# Patient Record
Sex: Male | Born: 1960
Health system: Southern US, Community
[De-identification: ages and names within clinical notes are randomized; demographics above are authoritative.]

## PROBLEM LIST (undated history)

## (undated) DIAGNOSIS — K219 Gastro-esophageal reflux disease without esophagitis: Secondary | ICD-10-CM

## (undated) DIAGNOSIS — F329 Major depressive disorder, single episode, unspecified: Secondary | ICD-10-CM

## (undated) DIAGNOSIS — K5900 Constipation, unspecified: Secondary | ICD-10-CM

## (undated) DIAGNOSIS — I10 Essential (primary) hypertension: Secondary | ICD-10-CM

## (undated) DIAGNOSIS — K7460 Unspecified cirrhosis of liver: Secondary | ICD-10-CM

## (undated) DIAGNOSIS — J449 Chronic obstructive pulmonary disease, unspecified: Secondary | ICD-10-CM

## (undated) DIAGNOSIS — R0600 Dyspnea, unspecified: Secondary | ICD-10-CM

## (undated) DIAGNOSIS — F1010 Alcohol abuse, uncomplicated: Secondary | ICD-10-CM

## (undated) DIAGNOSIS — F32A Depression, unspecified: Secondary | ICD-10-CM

## (undated) HISTORY — PX: NO PAST SURGERIES: SHX2092

## (undated) HISTORY — PX: MANDIBLE FRACTURE SURGERY: SHX706

---

## 2012-05-08 ENCOUNTER — Emergency Department (HOSPITAL_COMMUNITY)
Admission: EM | Admit: 2012-05-08 | Discharge: 2012-05-08 | Discharge status: Against Medical Advice | Payer: Self-pay | Attending: Emergency Medicine | Admitting: Emergency Medicine

## 2012-05-08 ENCOUNTER — Encounter (HOSPITAL_COMMUNITY): Payer: Self-pay | Admitting: Emergency Medicine

## 2012-05-08 DIAGNOSIS — J3489 Other specified disorders of nose and nasal sinuses: Secondary | ICD-10-CM | POA: Insufficient documentation

## 2012-05-08 DIAGNOSIS — R059 Cough, unspecified: Secondary | ICD-10-CM | POA: Insufficient documentation

## 2012-05-08 DIAGNOSIS — M549 Dorsalgia, unspecified: Secondary | ICD-10-CM | POA: Insufficient documentation

## 2012-05-08 DIAGNOSIS — R05 Cough: Secondary | ICD-10-CM | POA: Insufficient documentation

## 2012-05-08 NOTE — ED Notes (Signed)
Bed:WA05<BR> Expected date:<BR> Expected time:<BR> Means of arrival:<BR> Comments:<BR> ems

## 2012-05-08 NOTE — ED Notes (Signed)
Per EMS-Pt c/o of chronic lower back pain 10/10. Also c/o of nasal congestion and nonproductive cough causing chest wall pain 10/10. Denies history of COPD/Asthma. NAD at this time.

## 2012-05-08 NOTE — ED Notes (Signed)
Pt came in EMS, left AMA, before being seen by a doctor.  Was here x55mins.  Charge Diane aware.

## 2012-10-02 ENCOUNTER — Encounter (HOSPITAL_COMMUNITY): Payer: Self-pay | Admitting: *Deleted

## 2012-10-02 ENCOUNTER — Emergency Department (HOSPITAL_COMMUNITY): Payer: Self-pay

## 2012-10-02 ENCOUNTER — Emergency Department (HOSPITAL_COMMUNITY)
Admission: EM | Admit: 2012-10-02 | Discharge: 2012-10-03 | Disposition: A | Discharge status: Home / Self Care | Payer: Self-pay | Attending: Emergency Medicine | Admitting: Emergency Medicine

## 2012-10-02 DIAGNOSIS — E872 Acidosis, unspecified: Secondary | ICD-10-CM | POA: Insufficient documentation

## 2012-10-02 DIAGNOSIS — F10929 Alcohol use, unspecified with intoxication, unspecified: Secondary | ICD-10-CM

## 2012-10-02 DIAGNOSIS — I10 Essential (primary) hypertension: Secondary | ICD-10-CM | POA: Insufficient documentation

## 2012-10-02 DIAGNOSIS — G8929 Other chronic pain: Secondary | ICD-10-CM | POA: Insufficient documentation

## 2012-10-02 DIAGNOSIS — F1010 Alcohol abuse, uncomplicated: Secondary | ICD-10-CM | POA: Insufficient documentation

## 2012-10-02 DIAGNOSIS — F17200 Nicotine dependence, unspecified, uncomplicated: Secondary | ICD-10-CM | POA: Insufficient documentation

## 2012-10-02 DIAGNOSIS — M549 Dorsalgia, unspecified: Secondary | ICD-10-CM | POA: Insufficient documentation

## 2012-10-02 DIAGNOSIS — R059 Cough, unspecified: Secondary | ICD-10-CM | POA: Insufficient documentation

## 2012-10-02 DIAGNOSIS — R05 Cough: Secondary | ICD-10-CM | POA: Insufficient documentation

## 2012-10-02 LAB — CBC WITH DIFFERENTIAL/PLATELET
Basophils Relative: 1 % (ref 0–1)
Eosinophils Absolute: 0.5 10*3/uL (ref 0.0–0.7)
HCT: 44.2 % (ref 39.0–52.0)
Hemoglobin: 16 g/dL (ref 13.0–17.0)
MCH: 31.9 pg (ref 26.0–34.0)
MCHC: 36.2 g/dL — ABNORMAL HIGH (ref 30.0–36.0)
Monocytes Absolute: 0.5 10*3/uL (ref 0.1–1.0)
Monocytes Relative: 5 % (ref 3–12)

## 2012-10-02 LAB — COMPREHENSIVE METABOLIC PANEL
Albumin: 3.8 g/dL (ref 3.5–5.2)
BUN: 6 mg/dL (ref 6–23)
Chloride: 95 mEq/L — ABNORMAL LOW (ref 96–112)
Creatinine, Ser: 0.61 mg/dL (ref 0.50–1.35)
GFR calc Af Amer: >90 mL/min (ref >90)
Total Bilirubin: 0.4 mg/dL (ref 0.3–1.2)

## 2012-10-02 LAB — RAPID URINE DRUG SCREEN, HOSP PERFORMED
Cocaine: NOT DETECTED
Opiates: NOT DETECTED
Tetrahydrocannabinol: NOT DETECTED

## 2012-10-02 MED ORDER — SODIUM CHLORIDE 0.9 % IV BOLUS (SEPSIS)
1000.0000 mL | Freq: Once | INTRAVENOUS | Status: AC
  Administered 2012-10-02: 1000 mL via INTRAVENOUS

## 2012-10-02 MED ORDER — LORAZEPAM 1 MG PO TABS: 0.0000 mg | ORAL_TABLET | Freq: Two times a day (BID) | ORAL | Status: DC

## 2012-10-02 MED ORDER — VITAMIN B-1 100 MG PO TABS
100.0000 mg | ORAL_TABLET | Freq: Every day | ORAL | Status: DC
  Administered 2012-10-02: 100 mg via ORAL
  Filled 2012-10-02: qty 1

## 2012-10-02 MED ORDER — THIAMINE HCL 100 MG/ML IJ SOLN: 100.0000 mg | Freq: Every day | INTRAMUSCULAR | Status: DC

## 2012-10-02 MED ORDER — FOLIC ACID 1 MG PO TABS
1.0000 mg | ORAL_TABLET | Freq: Every day | ORAL | Status: DC
  Administered 2012-10-02: 1 mg via ORAL
  Filled 2012-10-02: qty 1

## 2012-10-02 MED ORDER — LORAZEPAM 1 MG PO TABS
1.0000 mg | ORAL_TABLET | Freq: Four times a day (QID) | ORAL | Status: DC | PRN
  Filled 2012-10-02: qty 2

## 2012-10-02 MED ORDER — LORAZEPAM 2 MG/ML IJ SOLN: 1.0000 mg | Freq: Four times a day (QID) | INTRAMUSCULAR | Status: DC | PRN

## 2012-10-02 MED ORDER — ADULT MULTIVITAMIN W/MINERALS CH
1.0000 | ORAL_TABLET | Freq: Every day | ORAL | Status: DC
  Administered 2012-10-02: 1 via ORAL
  Filled 2012-10-02: qty 1

## 2012-10-02 MED ORDER — LORAZEPAM 1 MG PO TABS
0.0000 mg | ORAL_TABLET | Freq: Four times a day (QID) | ORAL | Status: DC
  Administered 2012-10-02: 2 mg via ORAL

## 2012-10-02 NOTE — ED Notes (Signed)
Per ems called out per pt's brother d/t pt binge drinking for 6 days w/o any food. Upon ems arrival pt able to ambulate to truck. Pt states he has been drinking today. Pt also c/o chronic back pain.

## 2012-10-02 NOTE — ED Provider Notes (Signed)
History     CSN: [id number]  Arrival date & time 10/02/12  Ernestina Columbia   First MD Initiated Contact with Patient 10/02/12 2004      Chief Complaint  Patient presents with  . Alcohol Intoxication    (Consider location/radiation/quality/duration/timing/severity/associated sxs/prior treatment) HPI Comments: Patient is a 52 year old male current everyday smoker with a history of binge drinking that presents to the emergency department because his brother called EMS.  Family member reports that he has been binge drinking for the last 6 days with no food intake.  Per EMS when they arrived patient was able to ambulate to truck without difficulty.  Patient does not deny drinking today stating that he drank multiple beers.  Patient reports profusely that he does not want to be here and is ready to go home.  Patient denies any suicidal homicidal ideations.  Patient reports that he does not want help with detox.  Patient denies any visual or auditory hallucinations, a history of DT, fevers, night sweats or chills.  Patient reports chronic back pain and cough.  The history is provided by the patient and a significant other.    History reviewed. No pertinent past medical history.  History reviewed. No pertinent past surgical history.  History reviewed. No pertinent family history.  History  Substance Use Topics  . Smoking status: Current Every Day Smoker  . Smokeless tobacco: Not on file  . Alcohol Use: Yes     Comment: 6 days bringe drinking      Review of Systems  All other systems reviewed and are negative.    Allergies  Review of patient's allergies indicates no known allergies.  Home Medications  No current outpatient prescriptions on file.  BP 148/101  Pulse 101  Temp(Src) 98.5 F (36.9 C) (Oral)  Resp 16  SpO2 98%  Physical Exam  Nursing note and vitals reviewed. Constitutional: He is oriented to person, place, and time. He appears well-developed and well-nourished. No  distress.  Hypertensive   HENT:  Head: Normocephalic and atraumatic.  Mouth/Throat: Oropharynx is clear and moist. No oropharyngeal exudate.  Eyes: Conjunctivae and EOM are normal. Pupils are equal, round, and reactive to light. No scleral icterus.  Neck: Normal range of motion. Neck supple. No tracheal deviation present. No thyromegaly present.  Cardiovascular: Normal rate, regular rhythm, normal heart sounds and intact distal pulses.   Pulmonary/Chest: Effort normal. No stridor.  Rhonchi in lungs bilaterally. Coughing on exam   Abdominal: Soft.  Musculoskeletal: Normal range of motion. He exhibits no edema and no tenderness.  Neurological: He is alert and oriented to person, place, and time. Coordination normal.  Skin: Skin is warm and dry. No rash noted. He is not diaphoretic. No erythema. No pallor.  Psychiatric: He has a normal mood and affect. His behavior is normal.    ED Course  Procedures (including critical care time)  Labs Reviewed  CBC WITH DIFFERENTIAL - Abnormal; Notable for the following:    MCHC 36.2 (*)    Neutrophils Relative 42 (*)    Lymphocytes Relative 48 (*)    Lymphs Abs 5.0 (*)    All other components within normal limits  COMPREHENSIVE METABOLIC PANEL - Abnormal; Notable for the following:    Sodium 133 (*)    Chloride 95 (*)    Glucose, Bld 111 (*)    AST 39 (*)    All other components within normal limits  ETHANOL - Abnormal; Notable for the following:    Alcohol, Ethyl (B) 320 (*)  All other components within normal limits  URINE RAPID DRUG SCREEN (HOSP PERFORMED)   No results found.   No diagnosis found.  BP 118/44  Pulse 77  Temp(Src) 98.2 F (36.8 C) (Oral)  Resp 18  SpO2 92%   MDM  Hypertension  Mild alcoholic ketoacidosis  Patient brought to emergency department after brother called the ambulance for drinking for 6 days.  Patient does not want detox.  Outpatient resources given.  Patient hasn't eaten well in the emergency  department and IV fluids given.  Labs and imaging reviewed showing the patient is intoxicated and has an anion gap of 15, but no ketonuria.  Patient able to ambulate in the emergency department prior to discharge. Advised to follow up and re-check blood pressure        Jaci Carrel, PA-C 10/03/12 0025

## 2012-10-02 NOTE — ED Notes (Signed)
Bed:WA03<BR> Expected date:<BR> Expected time:<BR> Means of arrival:<BR> Comments:<BR>

## 2012-10-03 LAB — URINALYSIS, ROUTINE W REFLEX MICROSCOPIC
Glucose, UA: NEGATIVE mg/dL
Hgb urine dipstick: NEGATIVE
Protein, ur: NEGATIVE mg/dL
Specific Gravity, Urine: 1.007 (ref 1.005–1.030)
pH: 5 (ref 5.0–8.0)

## 2012-10-03 NOTE — ED Notes (Signed)
Pt up for d/c but d/t pt being with etoh on board unable to leave w/o having a ride home. Pt states he does not have anyone to pick him up. Informed pt he will have to stay until his etoh level declines. Press photographer and PA informed and both agree. Will continue to monitor.

## 2012-10-03 NOTE — ED Provider Notes (Signed)
Medical screening examination/treatment/procedure(s) were performed by non-physician practitioner and as supervising physician I was immediately available for consultation/collaboration.   Gwyneth Sprout, MD 10/03/12 2028

## 2012-11-28 ENCOUNTER — Encounter (HOSPITAL_COMMUNITY): Payer: Self-pay | Admitting: *Deleted

## 2012-11-28 ENCOUNTER — Emergency Department (HOSPITAL_COMMUNITY)
Admission: EM | Admit: 2012-11-28 | Discharge: 2012-11-29 | Disposition: A | Discharge status: Home / Self Care | Payer: Self-pay | Attending: Emergency Medicine | Admitting: Emergency Medicine

## 2012-11-28 DIAGNOSIS — F10220 Alcohol dependence with intoxication, uncomplicated: Secondary | ICD-10-CM

## 2012-11-28 DIAGNOSIS — F17200 Nicotine dependence, unspecified, uncomplicated: Secondary | ICD-10-CM | POA: Insufficient documentation

## 2012-11-28 DIAGNOSIS — F1010 Alcohol abuse, uncomplicated: Secondary | ICD-10-CM | POA: Insufficient documentation

## 2012-11-28 MED ORDER — SODIUM CHLORIDE 0.9 % IV BOLUS (SEPSIS)
1000.0000 mL | Freq: Once | INTRAVENOUS | Status: AC
  Administered 2012-11-29: 1000 mL via INTRAVENOUS

## 2012-11-28 NOTE — ED Notes (Signed)
Pt to ER via EMS for ETOH intoxification and questionable drug use  (pt was found with Zyrtec and crack pipe); pt was at the hotel at which he lives and was laying on sidewalk; bystander called 911 pt reports that he has been binge drinking for 2 days; pt reports that he has not had any intake other than ETOH x 2 days; pt unable to ambulate due to intoxification.

## 2012-11-29 LAB — POCT I-STAT, CHEM 8
BUN: 8 mg/dL (ref 6–23)
Chloride: 107 mEq/L (ref 96–112)
HCT: 51 % (ref 39.0–52.0)
Potassium: 5.3 mEq/L — ABNORMAL HIGH (ref 3.5–5.1)
Sodium: 142 mEq/L (ref 135–145)

## 2012-11-29 LAB — ETHANOL: Alcohol, Ethyl (B): 449 mg/dL (ref 0–11)

## 2012-11-29 MED ORDER — SODIUM CHLORIDE 0.9 % IV BOLUS (SEPSIS)
1000.0000 mL | Freq: Once | INTRAVENOUS | Status: AC
  Administered 2012-11-29: 1000 mL via INTRAVENOUS

## 2012-11-29 NOTE — ED Provider Notes (Signed)
8:35 AM patient alert [place] to come score 15 does not appear intoxicated. Pleasant cooperative patient states he does not have an alcohol problem. Does admit to binge drinking.  I suggested the patient that he may have a drinking problem to stand up in the emergency department with very high blood alcohol.  Results for orders placed during the hospital encounter of 11/28/12  ETHANOL      Result Value Range   Alcohol, Ethyl (B) 449 (*) 0 - 11 mg/dL  POCT I-STAT, CHEM 8      Result Value Range   Sodium 142  135 - 145 mEq/L   Potassium 5.3 (*) 3.5 - 5.1 mEq/L   Chloride 107  96 - 112 mEq/L   BUN 8  6 - 23 mg/dL   Creatinine, Ser 8.11 (*) 0.50 - 1.35 mg/dL   Glucose, Bld 93  70 - 99 mg/dL   Calcium, Ion 9.14 (*) 1.12 - 1.23 mmol/L   TCO2 20  0 - 100 mmol/L   Hemoglobin 17.3 (*) 13.0 - 17.0 g/dL   HCT 78.2  95.6 - 21.3 %   No results found.  Diagnosis#1 alcohol intoxication  #2 mild renal insufficiency  Doug Sou, MD 11/29/12 [phone]

## 2012-11-29 NOTE — ED Provider Notes (Signed)
   History    CSN: [id number] Arrival date & time 11/28/12  2150  First MD Initiated Contact with Patient 11/28/12 2222     Chief Complaint  Patient presents with  . Alcohol Intoxication   (Consider location/radiation/quality/duration/timing/severity/associated sxs/prior Treatment) HPI  Patient presents to the emergency department via EMS for alcohol intoxication.  Patient was found lying on the sidewalk outside of his apartment complex.  The patient, states he had been drinking for the last 2 days.  Patient is unable to give any history as he is sleeping and will not arouse.  History reviewed. No pertinent past medical history. History reviewed. No pertinent past surgical history. No family history on file. History  Substance Use Topics  . Smoking status: Current Every Day Smoker    Types: Cigarettes  . Smokeless tobacco: Not on file  . Alcohol Use: Yes     Comment: 6 days bringe drinking    Review of Systems Level V caveat applies due to alcohol intoxication Allergies  Review of patient's allergies indicates no known allergies.  Home Medications  No current outpatient prescriptions on file. BP 109/71  Pulse 79  Temp(Src) 98.1 F (36.7 C) (Oral)  Resp 20  SpO2 94% Physical Exam  Nursing note and vitals reviewed. Constitutional: He appears well-developed and well-nourished. No distress.  HENT:  Head: Normocephalic and atraumatic.  Cardiovascular: Normal rate, regular rhythm and normal heart sounds.   Pulmonary/Chest: Effort normal and breath sounds normal.  Skin: Skin is warm and dry.    ED Course  Procedures (including critical care time) Labs Reviewed  POCT I-STAT, CHEM 8 - Abnormal; Notable for the following:    Potassium 5.3 (*)    Creatinine, Ser 1.40 (*)    Calcium, Ion 0.92 (*)    Hemoglobin 17.3 (*)    All other components within normal limits  ETHANOL   Patient is stable.  Patient receives IV fluids.  Will need to wait for the patient's  overall   MDM    Carlyle Dolly, PA-C 11/29/12 [phone]

## 2012-12-04 NOTE — ED Provider Notes (Signed)
Medical screening examination/treatment/procedure(s) were performed by non-physician practitioner and as supervising physician I was immediately available for consultation/collaboration.  Jonnette Nuon, MD 12/04/12 0718 

## 2013-08-24 ENCOUNTER — Emergency Department (HOSPITAL_COMMUNITY)
Admission: EM | Admit: 2013-08-24 | Discharge: 2013-08-25 | Disposition: A | Discharge status: Other Acute Inpatient Hospital | Payer: Federal, State, Local not specified - Other | Attending: Emergency Medicine | Admitting: Emergency Medicine

## 2013-08-24 ENCOUNTER — Encounter (HOSPITAL_COMMUNITY): Payer: Self-pay | Admitting: Emergency Medicine

## 2013-08-24 DIAGNOSIS — F1020 Alcohol dependence, uncomplicated: Secondary | ICD-10-CM

## 2013-08-24 DIAGNOSIS — G8929 Other chronic pain: Secondary | ICD-10-CM

## 2013-08-24 DIAGNOSIS — F32A Depression, unspecified: Secondary | ICD-10-CM

## 2013-08-24 DIAGNOSIS — R0789 Other chest pain: Secondary | ICD-10-CM

## 2013-08-24 DIAGNOSIS — F3289 Other specified depressive episodes: Secondary | ICD-10-CM | POA: Insufficient documentation

## 2013-08-24 DIAGNOSIS — F489 Nonpsychotic mental disorder, unspecified: Secondary | ICD-10-CM | POA: Insufficient documentation

## 2013-08-24 DIAGNOSIS — F17200 Nicotine dependence, unspecified, uncomplicated: Secondary | ICD-10-CM | POA: Insufficient documentation

## 2013-08-24 DIAGNOSIS — R45851 Suicidal ideations: Secondary | ICD-10-CM | POA: Insufficient documentation

## 2013-08-24 DIAGNOSIS — F329 Major depressive disorder, single episode, unspecified: Secondary | ICD-10-CM | POA: Insufficient documentation

## 2013-08-24 DIAGNOSIS — R059 Cough, unspecified: Secondary | ICD-10-CM | POA: Insufficient documentation

## 2013-08-24 DIAGNOSIS — R05 Cough: Secondary | ICD-10-CM | POA: Insufficient documentation

## 2013-08-24 DIAGNOSIS — R071 Chest pain on breathing: Secondary | ICD-10-CM | POA: Insufficient documentation

## 2013-08-24 HISTORY — DX: Alcohol abuse, uncomplicated: F10.10

## 2013-08-24 NOTE — ED Provider Notes (Signed)
CSN: [id number]     Arrival date & time 08/24/13  2331 History   First MD Initiated Contact with Patient 08/24/13 2340     No chief complaint on file.    (Consider location/radiation/quality/duration/timing/severity/associated sxs/prior Treatment) HPI Comments: Pt is intoxicated currently, comes here due to severe episodic sharp CP's on left rib cage area that he reports he has had intermittent for many years, but never had evaluated, worse today.  Also, because of these chronic pains, poor living condition, having only a brother that he lives with but isn't close to reports he doesn't want to live.  No specific plan, he reports he has tried to drink himself to death before, but always wakes up.  He tried to hang himself a long time ago and woke up in the hospital.  He indicates that he has no guns.  He reports no drug use, but drinks daily up to 6-7 40's of beer daily.  He has been sober episodically, but cannot tell me exactly when was the last time.  He has a chronic cough, smokes, non productive, not worse, no N/V/d/.  No black or tarry stools.  No abd pain, dysuria.  Level 5 caveat due to intoxication and intermittent uncooperativeness and labile mood.  The history is provided by the patient.    Past Medical History  Diagnosis Date  . ETOH abuse    History reviewed. No pertinent past surgical history. No family history on file. History  Substance Use Topics  . Smoking status: Current Every Day Smoker    Types: Cigarettes  . Smokeless tobacco: Not on file  . Alcohol Use: Yes     Comment: 6 days bringe drinking    Review of Systems  Unable to perform ROS: Psychiatric disorder      Allergies  Review of patient's allergies indicates no known allergies.  Home Medications  No current outpatient prescriptions on file. BP 119/89  Pulse 95  Temp(Src) 97.8 F (36.6 C) (Oral)  Resp 20  SpO2 95% Physical Exam  Nursing note and vitals reviewed. Constitutional: He is oriented to  person, place, and time. He appears well-developed and well-nourished.  will have paroxysms of pain during H&P apparently coming from left rib cage that lasts for a second or two and eases off, repeatedly  HENT:  Head: Normocephalic and atraumatic.  Eyes: EOM are normal. Right conjunctiva is injected. Left conjunctiva is injected. No scleral icterus.  Cardiovascular: Normal rate and regular rhythm.   Pulmonary/Chest: Effort normal.  Abdominal: Soft. He exhibits no distension. There is no rebound and no guarding.  Neurological: He is alert and oriented to person, place, and time. He exhibits normal muscle tone. Coordination normal.  Skin: Skin is warm and dry.  Psychiatric: His mood appears not anxious. His affect is labile. He is not slowed. Cognition and memory are not impaired. He expresses impulsivity. He exhibits a depressed mood. He expresses suicidal ideation. He expresses no suicidal plans.    ED Course  Procedures (including critical care time) Labs Review Labs Reviewed  COMPREHENSIVE METABOLIC PANEL - Abnormal; Notable for the following:    BUN 5 (*)    AST 40 (*)    Total Bilirubin <0.2 (*)    All other components within normal limits  ETHANOL - Abnormal; Notable for the following:    Alcohol, Ethyl (B) 300 (*)    All other components within normal limits  LIPASE, BLOOD  URINALYSIS, ROUTINE W REFLEX MICROSCOPIC  URINE RAPID DRUG SCREEN (HOSP  PERFORMED)  CBC  TROPONIN I   Imaging Review Dg Chest 2 View  08/25/2013   CLINICAL DATA:  Cough and left-sided chest pain  EXAM: CHEST  2 VIEW  COMPARISON:  10/02/2012  FINDINGS: COPD with pulmonary hyperinflation and chronic interstitial coarsening. No infiltrate, air leak, effusion, or edema. Normal heart size.  IMPRESSION: COPD without evidence of acute superimposed process.   Electronically Signed   By: Tiburcio Pea M.D.   On: 08/25/2013 00:51     EKG Interpretation   Date/Time:  Sunday August 24 2013 23:40:09  EDT Ventricular Rate:  86 PR Interval:  142 QRS Duration: 103 QT Interval:  375 QTC Calculation: 448 R Axis:   88 Text Interpretation:  Sinus rhythm Nonspecific T abnrm, anterolateral  leads ST elev, probable normal early repol pattern Baseline wander in  lead(s) V3 No previous tracing Confirmed by [facility]  MD, MICHEAL ([id number]) on  08/24/2013 11:55:35 PM      RA sat is 95% and I interpret to be adequate  1:56 AM Only slight bumpt to AST . Pt seen by TTS, pt needs inpatient due to now has plans to jump into traffic in order to kill himself.  Will need commitment for now.  CP seems to be chronic and musculoskeletal in nature only.  Troponin is normal.  Will treat with NSAIDs for now.  CIWA initiated.      MDM   Final diagnoses:  Chronic chest wall pain  Alcoholism  Depression    Pt with paroxysmal sharp, pleuritic type pain that is severe and very episodic in nature from left chest wall.  VS are normal.  RA sats are 95% and adequate.  No PE risk factors, no recent long distance travel, lower ext edema or calf tenderness.  Lungs are clear, occasional coughing fit, non productive.  ECG shwos early repol pattern.  Will get troponin, CXR, screening labs.  Treat with GI meds and NSAIDs and consult with TTS.      Gavin Pound. Manuel Lawhead, MD 08/25/13 0157

## 2013-08-25 ENCOUNTER — Encounter (HOSPITAL_COMMUNITY): Payer: Self-pay | Admitting: *Deleted

## 2013-08-25 ENCOUNTER — Encounter (HOSPITAL_COMMUNITY): Payer: Self-pay | Admitting: Licensed Clinical Social Worker

## 2013-08-25 ENCOUNTER — Emergency Department (HOSPITAL_COMMUNITY): Payer: Self-pay

## 2013-08-25 ENCOUNTER — Inpatient Hospital Stay (HOSPITAL_COMMUNITY)
Admission: AD | Admit: 2013-08-25 | Discharge: 2013-08-29 | DRG: 897 | Disposition: A | Discharge status: Home / Self Care | Payer: Federal, State, Local not specified - Other | Source: Intra-hospital | Attending: Psychiatry | Admitting: Psychiatry

## 2013-08-25 DIAGNOSIS — F1020 Alcohol dependence, uncomplicated: Secondary | ICD-10-CM

## 2013-08-25 DIAGNOSIS — G4700 Insomnia, unspecified: Secondary | ICD-10-CM | POA: Diagnosis present

## 2013-08-25 DIAGNOSIS — R45851 Suicidal ideations: Secondary | ICD-10-CM

## 2013-08-25 DIAGNOSIS — G8929 Other chronic pain: Secondary | ICD-10-CM | POA: Diagnosis present

## 2013-08-25 DIAGNOSIS — F411 Generalized anxiety disorder: Secondary | ICD-10-CM | POA: Diagnosis present

## 2013-08-25 DIAGNOSIS — F17200 Nicotine dependence, unspecified, uncomplicated: Secondary | ICD-10-CM | POA: Diagnosis present

## 2013-08-25 DIAGNOSIS — F321 Major depressive disorder, single episode, moderate: Secondary | ICD-10-CM | POA: Diagnosis present

## 2013-08-25 DIAGNOSIS — F39 Unspecified mood [affective] disorder: Secondary | ICD-10-CM

## 2013-08-25 DIAGNOSIS — F1994 Other psychoactive substance use, unspecified with psychoactive substance-induced mood disorder: Secondary | ICD-10-CM | POA: Diagnosis present

## 2013-08-25 DIAGNOSIS — F410 Panic disorder [episodic paroxysmal anxiety] without agoraphobia: Secondary | ICD-10-CM | POA: Diagnosis present

## 2013-08-25 LAB — URINALYSIS, ROUTINE W REFLEX MICROSCOPIC
Bilirubin Urine: NEGATIVE
Glucose, UA: NEGATIVE mg/dL
Hgb urine dipstick: NEGATIVE
Ketones, ur: NEGATIVE mg/dL
Leukocytes, UA: NEGATIVE
Nitrite: NEGATIVE
Protein, ur: NEGATIVE mg/dL
Specific Gravity, Urine: 1.005 (ref 1.005–1.030)
Urobilinogen, UA: 0.2 mg/dL (ref 0.0–1.0)
pH: 5 (ref 5.0–8.0)

## 2013-08-25 LAB — COMPREHENSIVE METABOLIC PANEL WITH GFR
Albumin: 3.8 g/dL (ref 3.5–5.2)
BUN: 5 mg/dL — ABNORMAL LOW (ref 6–23)
Calcium: 9 mg/dL (ref 8.4–10.5)
Creatinine, Ser: 0.6 mg/dL (ref 0.50–1.35)
Total Bilirubin: <0.2 mg/dL — ABNORMAL LOW (ref 0.3–1.2)
Total Protein: 7.7 g/dL (ref 6.0–8.3)

## 2013-08-25 LAB — CBC
HCT: 45.7 % (ref 39.0–52.0)
Hemoglobin: 16 g/dL (ref 13.0–17.0)
MCH: 33.9 pg (ref 26.0–34.0)
MCHC: 35 g/dL (ref 30.0–36.0)
MCV: 96.8 fL (ref 78.0–100.0)
Platelets: 190 K/uL (ref 150–400)
RBC: 4.72 MIL/uL (ref 4.22–5.81)
RDW: 13.1 % (ref 11.5–15.5)
WBC: 5.9 10*3/uL (ref 4.0–10.5)

## 2013-08-25 LAB — COMPREHENSIVE METABOLIC PANEL
ALT: 21 U/L (ref 0–53)
AST: 40 U/L — ABNORMAL HIGH (ref 0–37)
Alkaline Phosphatase: 83 U/L (ref 39–117)
CO2: 22 mEq/L (ref 19–32)
Chloride: 104 mEq/L (ref 96–112)
GFR calc Af Amer: >90 mL/min (ref >90)
GFR calc non Af Amer: >90 mL/min (ref >90)
Glucose, Bld: 91 mg/dL (ref 70–99)
Potassium: 4.1 mEq/L (ref 3.7–5.3)
Sodium: 143 mEq/L (ref 137–147)

## 2013-08-25 LAB — TROPONIN I: Troponin I: <0.3 ng/mL (ref <0.30)

## 2013-08-25 LAB — RAPID URINE DRUG SCREEN, HOSP PERFORMED
Amphetamines: NOT DETECTED
Barbiturates: NOT DETECTED
Benzodiazepines: NOT DETECTED
Cocaine: NOT DETECTED
Opiates: NOT DETECTED
Tetrahydrocannabinol: NOT DETECTED

## 2013-08-25 LAB — ETHANOL: Alcohol, Ethyl (B): 300 mg/dL — ABNORMAL HIGH (ref 0–11)

## 2013-08-25 LAB — LIPASE, BLOOD: Lipase: 45 U/L (ref 11–59)

## 2013-08-25 MED ORDER — NICOTINE 21 MG/24HR TD PT24
21.0000 mg | MEDICATED_PATCH | Freq: Every day | TRANSDERMAL | Status: DC
  Administered 2013-08-25: 21 mg via TRANSDERMAL
  Filled 2013-08-25: qty 1

## 2013-08-25 MED ORDER — CHLORDIAZEPOXIDE HCL 25 MG PO CAPS
25.0000 mg | ORAL_CAPSULE | Freq: Four times a day (QID) | ORAL | Status: AC
  Administered 2013-08-25 – 2013-08-26 (×6): 25 mg via ORAL
  Filled 2013-08-25 (×6): qty 1

## 2013-08-25 MED ORDER — ALUM & MAG HYDROXIDE-SIMETH 200-200-20 MG/5ML PO SUSP: 30.0000 mL | ORAL | Status: DC | PRN

## 2013-08-25 MED ORDER — HYDROXYZINE HCL 25 MG PO TABS
25.0000 mg | ORAL_TABLET | Freq: Four times a day (QID) | ORAL | Status: AC | PRN
  Administered 2013-08-26: 25 mg via ORAL
  Filled 2013-08-25 (×2): qty 1

## 2013-08-25 MED ORDER — ACETAMINOPHEN 325 MG PO TABS
650.0000 mg | ORAL_TABLET | Freq: Four times a day (QID) | ORAL | Status: DC | PRN
  Administered 2013-08-26 – 2013-08-28 (×3): 650 mg via ORAL
  Filled 2013-08-25 (×3): qty 2

## 2013-08-25 MED ORDER — LORAZEPAM 1 MG PO TABS: 0.0000 mg | ORAL_TABLET | Freq: Two times a day (BID) | ORAL | Status: DC

## 2013-08-25 MED ORDER — THIAMINE HCL 100 MG/ML IJ SOLN
100.0000 mg | Freq: Every day | INTRAMUSCULAR | Status: DC
Start: 2013-08-25 — End: 2013-08-25

## 2013-08-25 MED ORDER — ONDANSETRON 4 MG PO TBDP
4.0000 mg | ORAL_TABLET | Freq: Four times a day (QID) | ORAL | Status: AC | PRN
  Administered 2013-08-27: 4 mg via ORAL
  Filled 2013-08-25: qty 1

## 2013-08-25 MED ORDER — KETOROLAC TROMETHAMINE 30 MG/ML IJ SOLN
30.0000 mg | Freq: Once | INTRAMUSCULAR | Status: AC
  Administered 2013-08-25: 30 mg via INTRAMUSCULAR
  Filled 2013-08-25: qty 1

## 2013-08-25 MED ORDER — LOPERAMIDE HCL 2 MG PO CAPS: 2.0000 mg | ORAL_CAPSULE | ORAL | Status: AC | PRN

## 2013-08-25 MED ORDER — LORAZEPAM 1 MG PO TABS
0.0000 mg | ORAL_TABLET | Freq: Four times a day (QID) | ORAL | Status: DC
  Administered 2013-08-25: 1 mg via ORAL
  Filled 2013-08-25: qty 1

## 2013-08-25 MED ORDER — ADULT MULTIVITAMIN W/MINERALS CH
1.0000 | ORAL_TABLET | Freq: Every day | ORAL | Status: DC
  Administered 2013-08-25 – 2013-08-29 (×5): 1 via ORAL
  Filled 2013-08-25 (×8): qty 1

## 2013-08-25 MED ORDER — CHLORDIAZEPOXIDE HCL 25 MG PO CAPS
25.0000 mg | ORAL_CAPSULE | ORAL | Status: AC
  Administered 2013-08-28 (×2): 25 mg via ORAL
  Filled 2013-08-25 (×2): qty 1

## 2013-08-25 MED ORDER — CHLORDIAZEPOXIDE HCL 25 MG PO CAPS
25.0000 mg | ORAL_CAPSULE | Freq: Once | ORAL | Status: AC
  Administered 2013-08-25: 25 mg via ORAL
  Filled 2013-08-25: qty 1

## 2013-08-25 MED ORDER — ACETAMINOPHEN 325 MG PO TABS: 650.0000 mg | ORAL_TABLET | ORAL | Status: DC | PRN

## 2013-08-25 MED ORDER — TRAZODONE HCL 50 MG PO TABS
50.0000 mg | ORAL_TABLET | Freq: Every evening | ORAL | Status: DC | PRN
  Administered 2013-08-26 – 2013-08-28 (×3): 50 mg via ORAL
  Filled 2013-08-25 (×3): qty 1
  Filled 2013-08-25: qty 14

## 2013-08-25 MED ORDER — MAGNESIUM HYDROXIDE 400 MG/5ML PO SUSP: 30.0000 mL | Freq: Every day | ORAL | Status: DC | PRN

## 2013-08-25 MED ORDER — NICOTINE 21 MG/24HR TD PT24
21.0000 mg | MEDICATED_PATCH | Freq: Every day | TRANSDERMAL | Status: DC
  Administered 2013-08-26 – 2013-08-29 (×4): 21 mg via TRANSDERMAL
  Filled 2013-08-25 (×6): qty 1

## 2013-08-25 MED ORDER — GI COCKTAIL ~~LOC~~
30.0000 mL | Freq: Once | ORAL | Status: AC
  Administered 2013-08-25: 30 mL via ORAL
  Filled 2013-08-25: qty 30

## 2013-08-25 MED ORDER — ONDANSETRON HCL 4 MG PO TABS: 4.0000 mg | ORAL_TABLET | Freq: Three times a day (TID) | ORAL | Status: DC | PRN

## 2013-08-25 MED ORDER — VITAMIN B-1 100 MG PO TABS
100.0000 mg | ORAL_TABLET | Freq: Every day | ORAL | Status: DC
  Administered 2013-08-25: 100 mg via ORAL
  Filled 2013-08-25: qty 1

## 2013-08-25 MED ORDER — CHLORDIAZEPOXIDE HCL 25 MG PO CAPS
25.0000 mg | ORAL_CAPSULE | Freq: Every day | ORAL | Status: AC
  Administered 2013-08-29: 25 mg via ORAL
  Filled 2013-08-25: qty 1

## 2013-08-25 MED ORDER — VITAMIN B-1 100 MG PO TABS
100.0000 mg | ORAL_TABLET | Freq: Every day | ORAL | Status: DC
  Administered 2013-08-26 – 2013-08-29 (×4): 100 mg via ORAL
  Filled 2013-08-25 (×6): qty 1

## 2013-08-25 MED ORDER — IBUPROFEN 200 MG PO TABS: 600.0000 mg | ORAL_TABLET | Freq: Three times a day (TID) | ORAL | Status: DC | PRN

## 2013-08-25 MED ORDER — CHLORDIAZEPOXIDE HCL 25 MG PO CAPS
25.0000 mg | ORAL_CAPSULE | Freq: Three times a day (TID) | ORAL | Status: AC
  Administered 2013-08-27 (×3): 25 mg via ORAL
  Filled 2013-08-25 (×3): qty 1

## 2013-08-25 MED ORDER — LORAZEPAM 1 MG PO TABS: 1.0000 mg | ORAL_TABLET | Freq: Three times a day (TID) | ORAL | Status: DC | PRN

## 2013-08-25 MED ORDER — CHLORDIAZEPOXIDE HCL 25 MG PO CAPS
25.0000 mg | ORAL_CAPSULE | Freq: Four times a day (QID) | ORAL | Status: AC | PRN
  Administered 2013-08-27: 25 mg via ORAL
  Filled 2013-08-25: qty 1

## 2013-08-25 MED ORDER — THIAMINE HCL 100 MG/ML IJ SOLN: 100.0000 mg | Freq: Once | INTRAMUSCULAR | Status: DC

## 2013-08-25 NOTE — ED Notes (Signed)
[facility] and [facility] prepared.

## 2013-08-25 NOTE — [facility] Counselor (Addendum)
[facility] to bed [facility]2 once pt currently in that bed is being discharged. [facility]. [employer] placed in pt's chart.   Evette Cristalaroline Paige Sherilynn Dieu, [place] Assessment Counselor

## 2013-08-25 NOTE — Progress Notes (Signed)
Pt reports he is here for alcohol detox and was admitted today.  He denies any withdrawal symptoms at this time.  Writer discussed protocol with pt, and encouraged pt to make his needs known to staff.  Pt denies SI/HI/AV.  Pt is unsure if he will go for long term rehab.  Pt attended evening group tonight.  Support and encouragement offered.  Safety maintained with q15 minute checks.

## 2013-08-25 NOTE — Progress Notes (Signed)
[facility] provided pt with a list of primary care resources, highlighting [facility]. Also, explained to patient about [facility] [place] [facility] program to help [facility].

## 2013-08-25 NOTE — BH Assessment (Signed)
Tele Assessment Note   [name] is an 54 y.o. male, single, Caucasian who presents unaccompanied to [facility] ED due to pain in left rib cage, alcohol dependence and depressive symptoms. Pt is currently intoxicated with a blood alcohol of 300. Pt states repeatedly "I don't want to be alive anymore." Pt reports he has chronic pain in his chest, back and hips and that "I'm tired of living." He states he has been depressed for years and reports symptoms including crying spells, insomnia, poor appetite, irritability, anxiety, anhedonia and feelings of sadness, guilt and hopelessness. He states he feels anxious all the time. Pt stated repeatedly that he wants to leave the ED and walk in front of a moving truck or a train and kill himself. He reports he has a history of attempting to "drink myself to death" and reports he attempted to hang himself when his grandmother died back in the 1990's. Pt denies any intentional self-injurious behavior. He denies any homicidal ideation or history of violence. Pt denies current auditory or visual hallucinations but describes a history of tactile and visual disturbances that are consistent with delirium tremens.  He reports he started drinking alcohol when he was a child and has drank throughout his life. He reports drinking 8-9 40-ounce beers daily and has done so for years. He reports a history of withdrawal symptoms including tremors, nausea, vomiting, diarrhea, sweats and blackouts. He denies any other substance use and UDS is negative. Pt has a history of multiple DWI's throughout his life. Pt reports his father abused alcohol and died of cirrhosis.  Pt identifies his primary stressor as his chronic medical problems. Pt states his chronic pain is not related to a specific injury. 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 He feels he can no longer work and requested assistance with filing for disability. He says  he lives with his brother, who "gets a check," but he is tired of letting his brother financially support him. Pt has an adult daughter and three grandchildren but he doesn't feel comfortable asking them for help. He feels no one understands what he is going through.  Pt reports he was hospitalized for alcohol and depression in 1996 but cannot remember where. He states that he has been prescribed antidepressants and Trazodone in the past but "I don't like taking pills." He denies any history of outpatient mental health or substance abuse treatment.  Pt is disheveled and appears older than stated age. He is alert, intoxicated and oriented x4. He has slightly slurred speech and restless motor behavior. Thought process is coherent and goal directed. Pt does not appear to be responding to internal stimuli and does not appear to be experiencing delusional thought content. Pt's mood is depressed, anxious, irritable and frustrated and affect is labile. Pt very reluctantly admits that he needs to be in a facility for alcohol detox and treatment of his depressive symptoms.   Axis I: 303.90 Alcohol Use Disorder, Severe; 311 Unspecified Depressive Disorder Axis II: Deferred Axis III:  Past Medical History  Diagnosis Date  . ETOH abuse    Axis IV: economic problems, housing problems, occupational problems, problems with access to health care services and problems with primary support group Axis V: GAF=28  Past Medical History:  Past Medical History  Diagnosis Date  . ETOH abuse     History reviewed. No pertinent past surgical history.  Family History: No family history  on file.  Social History:  reports that he has been smoking Cigarettes.  He has been smoking about 0.00 packs per day. He does not have any smokeless tobacco history on file. He reports that he drinks alcohol. He reports that he does not use illicit drugs.  Additional Social History:  Alcohol / Drug Use Pain Medications: Denies  abuse Prescriptions: Denies abuse Over the Counter: Denies abuse History of alcohol / drug use?: Yes Longest period of sobriety (when/how long): "I don't know" Negative Consequences of Use: Financial;Legal;Personal relationships;Work / Programmer, multimediachool Withdrawal Symptoms: Agitation;Tremors;Sweats;Diarrhea;Nausea / Vomiting;Blackouts Substance #1 Name of Substance 1: Alcohol 1 - Age of First Use: 12 1 - Amount (size/oz): 8-9 40-oz beers 1 - Frequency: daily 1 - Duration: Many years 1 - Last Use / Amount: 08/23/13, eight 40-oz beers  CIWA: CIWA-Ar BP: 119/89 mmHg Pulse Rate: 95 Nausea and Vomiting: no nausea and no vomiting Tactile Disturbances: none Tremor: no tremor Auditory Disturbances: not present Paroxysmal Sweats: no sweat visible Visual Disturbances: not present Anxiety: no anxiety, at ease Headache, Fullness in Head: none present Agitation: two Orientation and Clouding of Sensorium: oriented and can do serial additions CIWA-Ar Total: 2 COWS:    Allergies: No Known Allergies  Home Medications:  (Not in a hospital admission)  OB/GYN Status:  No LMP for male patient.  General Assessment Data Location of Assessment: [facility] Is this a Tele or Face-to-Face Assessment?: Tele Assessment Is this an Initial Assessment or a Re-assessment for this encounter?: Initial Assessment Living Arrangements: Other relatives (Brother) Can pt return to current living arrangement?: Yes Admission Status: Voluntary Is patient capable of signing voluntary admission?: Yes Transfer from: Acute Hospital Referral Source: Self/Family/Friend     [facility] Living Arrangements: Other relatives (Brother) Name of Psychiatrist: None Name of Therapist: None  Education Status Is patient currently in school?: No Current Grade: NA Highest grade of school patient has completed: NA Name of school: NA Contact person: NA  Risk to self Suicidal Ideation: Yes-Currently Present Suicidal Intent:  Yes-Currently Present Is patient at risk for suicide?: Yes Suicidal Plan?: Yes-Currently Present Specify Current Suicidal Plan: Jump into traffic or in front of a train Access to Means: Yes Specify Access to Suicidal Means: Pt knows where he can find a train What has been your use of drugs/alcohol within the last 12 months?: Pt drinking alcohol daily Previous Attempts/Gestures: Yes How many times?: 3 Other Self Harm Risks: Pt reports blackouts due to alcohol intoxication Triggers for Past Attempts: Other (Comment) (Death of grandmother) Intentional Self Injurious Behavior: None Family Suicide History: No Recent stressful life event(s): Job Loss;Financial Problems;Other (Comment) (Chronic medical problems) Persecutory voices/beliefs?: No Depression: Yes Depression Symptoms: Despondent;Insomnia;Tearfulness;Isolating;Fatigue;Guilt;Loss of interest in usual pleasures;Feeling worthless/self pity;Feeling angry/irritable Substance abuse history and/or treatment for substance abuse?: Yes Suicide prevention information given to non-admitted patients: Not applicable  Risk to Others Homicidal Ideation: No Thoughts of Harm to Others: No Current Homicidal Intent: No Current Homicidal Plan: No Access to Homicidal Means: No Identified Victim: None History of harm to others?: No Assessment of Violence: None Noted Violent Behavior Description: Pt denies any history of violence Does patient have access to weapons?: No Criminal Charges Pending?: No Does patient have a court date: No  Psychosis Hallucinations: None noted (Not currently) Delusions: None noted  Mental Status Report Appear/Hygiene: Poor hygiene;Disheveled Eye Contact: Fair Motor Activity: Unremarkable Speech: Logical/coherent Level of Consciousness: Alert;Irritable Mood: Depressed;Anxious;Irritable;Labile Affect: Depressed;Anxious;Labile;Irritable Anxiety Level: Moderate Thought Processes: Coherent;Relevant Judgement:  Impaired Orientation: Person;Place;Time;Situation  Obsessive Compulsive Thoughts/Behaviors: None  Cognitive Functioning Concentration: Decreased Memory: Recent Intact;Remote Intact IQ: Average Insight: Fair Impulse Control: Fair Appetite: Poor Weight Loss: 0 (unknown) Weight Gain: 0 Sleep: Decreased Total Hours of Sleep: 3 Vegetative Symptoms: None  ADLScreening [facility] Assessment Services) Patient's cognitive ability adequate to safely complete daily activities?: Yes Patient able to express need for assistance with ADLs?: Yes Independently performs ADLs?: Yes (appropriate for developmental age)  Prior Inpatient Therapy Prior Inpatient Therapy: Yes Prior Therapy Dates: 1996 Prior Therapy Facilty/Provider(s): Unknown Reason for Treatment: Suicidal ideation  Prior Outpatient Therapy Prior Outpatient Therapy: No Prior Therapy Dates: NA Prior Therapy Facilty/Provider(s): NA Reason for Treatment: NA  ADL Screening (condition at time of admission) Patient's cognitive ability adequate to safely complete daily activities?: Yes Is the patient deaf or have difficulty hearing?: No Does the patient have difficulty seeing, even when wearing glasses/contacts?: No Does the patient have difficulty concentrating, remembering, or making decisions?: No Patient able to express need for assistance with ADLs?: Yes Does the patient have difficulty dressing or bathing?: No Independently performs ADLs?: Yes (appropriate for developmental age) Does the patient have difficulty walking or climbing stairs?: No Weakness of Legs: None Weakness of Arms/Hands: None  Home Assistive Devices/Equipment Home Assistive Devices/Equipment: None    Abuse/Neglect Assessment (Assessment to be complete while patient is alone) Physical Abuse: Denies Verbal Abuse: Denies Sexual Abuse: Denies Exploitation of patient/patient's resources: Denies Self-Neglect: Denies Values / Beliefs Cultural Requests During  Hospitalization: None Spiritual Requests During Hospitalization: None   Advance Directives (For Healthcare) Advance Directive: Patient does not have advance directive;Patient would not like information Pre-existing out of facility DNR order (yellow form or pink MOST form): No Nutrition Screen- [facility] Adult/[facility] Patient's home diet: Regular  Additional Information 1:1 In Past 12 Months?: No CIRT Risk: No Elopement Risk: Yes Does patient have medical clearance?: Yes     Disposition: Per Binnie Rail, AC at [facility], adult unit is at capacity. Consulted with Alberteen Sam, NP who agrees Pt meets inpatient criteria. Pt can be considered for admission to [facility] when bed available. TTS will contact other facilities for placement. Notified Dr. Quita Skye and Pt's RN of recommendation.  Disposition Initial Assessment Completed for this Encounter: Yes Disposition of Patient: Inpatient treatment program Type of inpatient treatment program: Adult [facility] at capacity. Other facilities will be contacted.)  Harlin Rain Patsy Baltimore, [facility], [facility] Triage Specialist [phone]   Patsy Baltimore, Harlin Rain 08/25/2013 2:04 AM

## 2013-08-25 NOTE — Progress Notes (Signed)
Pt accepted to [facility], however [facility] Bed anticipated to be available this afternoon. AC to call when bed available.   Byrd HesselbachKristen Jesi Jurgens, LCSW [phone]  ED CSW 08/25/2013 1135am

## 2013-08-25 NOTE — ED Notes (Signed)
Patient transported to X-ray 

## 2013-08-25 NOTE — Progress Notes (Signed)
Referrals were faxed to the following facilities with receipt confirmation in which they all had available beds or would consider reviewing for placement.  [facility]  Ambra Haverstick [place], MHT

## 2013-08-25 NOTE — Consult Note (Signed)
  Admitted for drinking alcohol. Feeling depressed and says long history of alcohol use. Infrequent help and has been a while he had detox.  No psychois or hallucinations. A: alcohol use disorder, severe with intoxication. Mood disorder unspecified Plan: Have started Detox protocol. Admit for detox pending bed availability at [facility] bed.

## 2013-08-25 NOTE — BH Assessment (Signed)
Assessment complete. Per Binnie RailJoann Glover, [facility], adult unit is at capacity. Consulted with Alberteen SamFran Hobson, NP who agrees Pt meets inpatient criteria. [facility]. TTS will contact other facilities for placement. Notified Dr. Quita SkyeMichael Ghim and Pt's RN of recommendation.  Harlin RainFord Ellis Ria CommentWarrick Jr, LPC, [facility] Triage Specialist [phone]

## 2013-08-25 NOTE — Progress Notes (Signed)
[facility]: @0504  Pt declined due to Substance Abuse.  [name], MHT

## 2013-08-25 NOTE — Tx Team (Signed)
Initial Interdisciplinary Treatment Plan  PATIENT STRENGTHS: (choose at least two) Average or above average intelligence Communication skills General fund of knowledge Physical Health  PATIENT STRESSORS: Financial difficulties Substance abuse   PROBLEM LIST: Problem List/Patient Goals Date to be addressed Date deferred Reason deferred Estimated date of resolution  Substance abuse 08/25/13     depression 08/25/13                                                DISCHARGE CRITERIA:  Ability to meet basic life and health needs Improved stabilization in mood, thinking, and/or behavior Medical problems require only outpatient monitoring Motivation to continue treatment in a less acute level of care Need for constant or close observation no longer present Reduction of life-threatening or endangering symptoms to within safe limits Safe-care adequate arrangements made Verbal commitment to aftercare and medication compliance Withdrawal symptoms are absent or subacute and managed without 24-hour nursing intervention  PRELIMINARY DISCHARGE PLAN: Outpatient therapy Return to previous living arrangement  PATIENT/FAMIILY INVOLVEMENT: This treatment plan has been presented to and reviewed with the patient, [name], and/or family member, .  The patient and family have been given the opportunity to ask questions and make suggestions.  [name], [name] 08/25/2013, 3:23 PM

## 2013-08-25 NOTE — Progress Notes (Signed)
Pt admitted voluntary for substance abuse of alcohol. Pt reports drinking 7-8 40oz beer daily and a 5th of liquor weekly. He has been drinking since age 53 and was sober from 2000-2004. He has been separated for 3 yrs, [place] two daughters in [place] along with grandchildren. He is living with his disabled brother at [facility]. Pt has not worked since 2012. 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 He walks and takes the bus when he has money. Pt reports a cough that started two weeks ago along with lt sided chest pain and soreness. No cough observed during admission process. Pt also reports urgency with urination. Pt reports that he has si thoughts only when he is drinking and his thought is to drink himself to death. Pt has had 6 prior substance abuse treatments. He currently denies si, hi and hallucinations.

## 2013-08-25 NOTE — BH Assessment (Signed)
Received call for assessment. Spoke with Quita SkyeMichael Ghim, MD who said Pt presented with both physical pain and depression and alcohol abuse. Pt reports passive SI but has a history of a suicide attempt. Tele-assessment will be initiated.  Harlin RainFord Ellis Ria CommentWarrick Jr, LPC, [facility] Triage Specialist [phone]

## 2013-08-26 ENCOUNTER — Encounter (HOSPITAL_COMMUNITY): Payer: Self-pay | Admitting: Psychiatry

## 2013-08-26 DIAGNOSIS — F1994 Other psychoactive substance use, unspecified with psychoactive substance-induced mood disorder: Secondary | ICD-10-CM | POA: Diagnosis present

## 2013-08-26 DIAGNOSIS — R45851 Suicidal ideations: Secondary | ICD-10-CM

## 2013-08-26 MED ORDER — LISINOPRIL 10 MG PO TABS
10.0000 mg | ORAL_TABLET | Freq: Every day | ORAL | Status: DC
  Administered 2013-08-27 – 2013-08-29 (×3): 10 mg via ORAL
  Filled 2013-08-26: qty 1
  Filled 2013-08-26: qty 14
  Filled 2013-08-26 (×3): qty 1

## 2013-08-26 MED ORDER — LISINOPRIL 20 MG PO TABS
20.0000 mg | ORAL_TABLET | Freq: Once | ORAL | Status: AC
  Administered 2013-08-26: 20 mg via ORAL
  Filled 2013-08-26: qty 1

## 2013-08-26 NOTE — Progress Notes (Signed)
D: Patient denies SI/HI and A/V hallucinations; patient reports sleep is fair; reports appetite is improving; reports energy level is low ; reports ability to pay attention is improving; rates depression as 6/10; rates hopelessness 6/10; patient reporting pain in ribcage  A: Monitored q 15 minutes; patient encouraged to attend groups; patient educated about medications; patient given medications per physician orders; patient encouraged to express feelings and/or concerns  R: Patient is pleasant and cooperative; patient anxious at times; patient is minimal in his interaction but is assertive when seeking what he needs; patient was able to set goal to talk with staff 1:1 when having feelings of SI; patient is taking medications as prescribed and tolerating medications; patient is attending all groups and forwarding when asked
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
## 2013-08-26 NOTE — Progress Notes (Signed)
Adult Psychoeducational Group Note  Date:  08/26/2013 Time:  9:00PM Group Topic/Focus:  Wrap-Up Group:   The focus of this group is to help patients review their daily goal of treatment and discuss progress on daily workbooks.  Participation Level:  Active  Participation Quality:  Appropriate and Attentive  Affect:  Appropriate  Cognitive:  Alert and Appropriate  Insight: Appropriate  Engagement in Group:  Engaged  Modes of Intervention:  Discussion  Additional Comments:  Pt. Was attentive and appropriate during tonight's group discussion. Pt was able to being the group with [facility]. Pt was able to share stories about drinking for years. Pt stated that he need to change his peers. Pt stated that he has been drinking for long as he know it.   Bing PlumeScott, Gudelia Eugene D 08/26/2013, 10:10 PM

## 2013-08-26 NOTE — [facility] Suicide Risk Assessment (Signed)
Suicide Risk Assessment  Admission Assessment     Nursing information obtained from:  Patient Demographic factors:  Male;Caucasian;Low socioeconomic status;Unemployed Current Mental Status:  Suicidal ideation indicated by patient;Self-harm behaviors Loss Factors:  NA Historical Factors:  Family history of mental illness or substance abuse Risk Reduction Factors:  Living with another person, especially a relative Total Time spent with patient: 1 hour  CLINICAL FACTORS:   Depression:   Comorbid alcohol abuse/dependence Alcohol/Substance Abuse/Dependencies  COGNITIVE FEATURES THAT CONTRIBUTE TO RISK:  Closed-mindedness Polarized thinking Thought constriction (tunnel vision)    SUICIDE RISK:   Moderate:  Frequent suicidal ideation with limited intensity, and duration, some specificity in terms of plans, no associated intent, good self-control, limited dysphoria/symptomatology, some risk factors present, and identifiable protective factors, including available and accessible social support.  PLAN OF CARE: Supportive approach/coping skills/relapse prevention                               Librium Detox protocol                               Reassess and address the co morbidities  I certify that inpatient services furnished can reasonably be expected to improve the patient's condition.  Joella Saefong A 08/26/2013, 5:34 PM

## 2013-08-26 NOTE — [facility] Group Notes (Signed)
[facility] LCSW Group Therapy  08/26/2013   1:15 PM   Type of Therapy:  Group Therapy  Participation Level:  Active  Participation Quality:  Attentive, Sharing and Supportive  Affect:  Depressed and Flat  Cognitive:  Alert and Oriented  Insight:  Developing/Improving and Engaged  Engagement in Therapy:  Developing/Improving and Engaged  Modes of Intervention:  Activity, Clarification, Confrontation, Discussion, Education, Exploration, Limit-setting, Orientation, Problem-solving, Rapport Building, Reality Testing, Socialization and Support  Summary of Progress/Problems: Patient was attentive and engaged with speaker from [employer].  Patient was attentive to speaker while they shared their story of dealing with mental health and overcoming it.  Patient expressed interest in their programs and services and received information on their agency.  Patient processed ways they can relate to the speaker.     Rondel Episcopo Horton, LCSW 08/26/2013  2:33 PM      
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
## 2013-08-26 NOTE — Progress Notes (Signed)
NUTRITION ASSESSMENT  Pt identified as at risk on the Malnutrition Screen Tool  INTERVENTION: 1. Educated patient on the importance of nutrition and encouraged intake of food and beverages.  Provided handout on Sobriety Nutrition Therapy. 2. Discussed weight goals. 3. Supplements: MVI and thiamine daily  NUTRITION DIAGNOSIS: Unintentional weight loss related to sub-optimal intake as evidenced by pt report.   Goal: Pt to meet >/= 90% of their estimated nutrition needs.  Monitor:  PO intake  Assessment:  Patient admitted for ETOH detox.  Patient reports that appetite has been increasing and has been eating well since admit.  Reports UBW of 215-220 lbs about 4 months ago with weight loss of about 40 lbs in the past 4 months.  Patient is 81% of his UBW.  States he was only eating once a day.  Did not get hungry secondary to etoh abuse.  Then would only eat a light amount at night.  Patient meets criteria for severe malnutrition related to social and environmental causes AEB 19% weight loss in the past 4-6 months, body fat and muscle mass loss.  53 y.o. male  Height: 6'1"   Weight: Wt Readings from Last 1 Encounters:  08/25/13 173 lb (78.472 kg)    Weight Hx: Wt Readings from Last 10 Encounters:  08/25/13 173 lb (78.472 kg)    BMI:  22.8 Pt meets criteria for normal weight based on current BMI. Patient appears thin for height, protruding collar bone, decreased body fat with increased belly size.  Estimated Nutritional Needs: Kcal: 25-30 kcal/kg Protein: > 1 gram protein/kg Fluid: 1 ml/kcal  Diet Order: General Pt is also offered choice of unit snacks mid-morning and mid-afternoon.  Pt is eating as desired.   Lab results and medications reviewed.   Oran ReinLaura Russel Morain, RD, LDN Clinical Inpatient Dietitian Pager:  [phone] Weekend and after hours pager:  [phone]

## 2013-08-26 NOTE — Progress Notes (Signed)
Recreation Therapy Notes  Animal-Assisted Activity/Therapy (AAA/T) Program Checklist/Progress Notes Patient Eligibility Criteria Checklist & Daily Group note for Rec Tx Intervention  Date: 03.30.2015 Time: 2:45pm Location: [address] Dayroom    AAA/T Program Assumption of Risk Form signed by Patient/ or Parent Legal Guardian yes  Patient is free of allergies or sever asthma yes  Patient reports no fear of animals yes  Patient reports no history of cruelty to animals yes   Patient understands his/her participation is voluntary yes  Behavioral Response: Did not attend.   Dory Demont L Ambrea Hegler, LRT/CTRS  Kampbell Holaway L 08/26/2013 5:19 PM 

## 2013-08-26 NOTE — Progress Notes (Signed)
The focus of this group is to educate the patient on the purpose and policies of crisis stabilization and provide a format to answer questions about their admission.  The group details unit policies and expectations of patients while admitted. Patient attended this group and was engaging. 

## 2013-08-27 DIAGNOSIS — F1994 Other psychoactive substance use, unspecified with psychoactive substance-induced mood disorder: Secondary | ICD-10-CM

## 2013-08-27 DIAGNOSIS — F1020 Alcohol dependence, uncomplicated: Principal | ICD-10-CM

## 2013-08-27 MED ORDER — CITALOPRAM HYDROBROMIDE 20 MG PO TABS
20.0000 mg | ORAL_TABLET | Freq: Every day | ORAL | Status: DC
  Administered 2013-08-27 – 2013-08-29 (×3): 20 mg via ORAL
  Filled 2013-08-27 (×6): qty 1
  Filled 2013-08-27: qty 14

## 2013-08-27 MED ORDER — CITALOPRAM HYDROBROMIDE 20 MG PO TABS: 20.0000 mg | ORAL_TABLET | Freq: Every day | ORAL | Status: DC

## 2013-08-27 NOTE — Progress Notes (Signed)
[facility]  08/27/2013 3:12 PM [name]  MRN:  [id number]  Subjective: [name] reports, "I'm a little nervous today, shaky with dizzy spells. I feel my mood going up and down. I'm not a happy person by nature. I stay depressed all the time. I have always felt down on myself, not anybody else, just me. I'm not a people person. I struggle when I'm around other people because I don't feel comfortable". Denies any SIHI, AVH.  Diagnosis:   DSM5: Schizophrenia Disorders:  NA Obsessive-Compulsive Disorders:  NA Trauma-Stressor Disorders:  NA Substance/Addictive Disorders:  Alcohol Related Disorder - Severe (303.90) Depressive Disorders:  Substance induced mood disorder Total Time spent with patient: 30 minutes  Axis I: Alcohol dependence, Substance induced mood disorder Axis II: Deferred Axis III:  Past Medical History  Diagnosis Date  . ETOH abuse    Axis IV: other psychosocial or environmental problems and Alcoholism, chronic Axis V: 41-50 serious symptoms  ADL's:  Poor  Sleep: Good  Appetite:  Fair  Suicidal Ideation:  Plan:  Denies Intent:  Denies Means:  Denies Homicidal Ideation:  Plan:  Denies Intent:  Denies Means:  Denies AEB (as evidenced by):  Psychiatric Specialty Exam: Physical Exam  Psychiatric: His speech is normal and behavior is normal. Judgment and thought content normal. His mood appears anxious. Cognition and memory are normal. He exhibits a depressed mood.    Review of Systems  Constitutional: Positive for chills and malaise/fatigue.  HENT: Negative.   Eyes: Negative.   Respiratory: Negative.   Cardiovascular: Negative.   Gastrointestinal: Negative.   Genitourinary: Negative.   Musculoskeletal: Negative.   Skin: Negative.   Neurological: Positive for dizziness, tremors and weakness.  Endo/Heme/Allergies: Negative.   Psychiatric/Behavioral: Positive for depression and substance abuse (Alcoholism). Negative for suicidal ideas,  hallucinations and memory loss. The patient is nervous/anxious and has insomnia.     Blood pressure 112/71, pulse 111, temperature 97.8 F (36.6 C), temperature source Oral, resp. rate 18, height 2' 2.77" (0.68 m), weight 78.472 kg (173 lb).Body mass index is 169.71 kg/(m^2).  General Appearance: Disheveled, facial areas appear flushed  Eye Contact::  Fair  Speech:  Clear and Coherent  Volume:  Normal  Mood:  Anxious and Depressed  Affect:  Restricted  Thought Process:  Coherent and Intact  Orientation:  Full (Time, Place, and Person)  Thought Content:  Rumination  Suicidal Thoughts:  No  Homicidal Thoughts:  No  Memory:  Immediate;   Fair Recent;   Fair Remote;   Fair  Judgement:  Fair  Insight:  Fair  Psychomotor Activity:  Restlessness  Concentration:  Fair  Recall:  FiservFair  Fund of Knowledge:Fair  Language: Good  Akathisia:  No  Handed:  Right  AIMS (if indicated):     Assets:  Desire for Improvement  Sleep:  Number of Hours: 6.75   Musculoskeletal: Strength & Muscle Tone: within normal limits Gait & Station: normal Patient leans: N/A  Current Medications: Current Facility-Administered Medications  Medication Dose Route Frequency Provider Last Rate Last Dose  . acetaminophen (TYLENOL) tablet 650 mg  650 mg Oral Q6H PRN Sanjuana KavaAgnes I Nwoko, NP   650 mg at 08/27/13 1308  . alum & mag hydroxide-simeth (MAALOX/MYLANTA) 200-200-20 MG/5ML suspension 30 mL  30 mL Oral Q4H PRN Sanjuana KavaAgnes I Nwoko, NP      . chlordiazePOXIDE (LIBRIUM) capsule 25 mg  25 mg Oral Q6H PRN Sanjuana KavaAgnes I Nwoko, NP      . chlordiazePOXIDE (LIBRIUM) capsule 25  mg  25 mg Oral TID Sanjuana Kava, NP   25 mg at 08/27/13 1204   Followed by  . [START ON 08/28/2013] chlordiazePOXIDE (LIBRIUM) capsule 25 mg  25 mg Oral BH-qamhs Sanjuana Kava, NP       Followed by  . [START ON 08/29/2013] chlordiazePOXIDE (LIBRIUM) capsule 25 mg  25 mg Oral Daily Sanjuana Kava, NP      . hydrOXYzine (ATARAX/VISTARIL) tablet 25 mg  25 mg Oral Q6H  PRN Sanjuana Kava, NP   25 mg at 08/26/13 1652  . lisinopril (PRINIVIL,ZESTRIL) tablet 10 mg  10 mg Oral Daily Sanjuana Kava, NP   10 mg at 08/27/13 0865  . loperamide (IMODIUM) capsule 2-4 mg  2-4 mg Oral PRN Sanjuana Kava, NP      . magnesium hydroxide (MILK OF MAGNESIA) suspension 30 mL  30 mL Oral Daily PRN Sanjuana Kava, NP      . multivitamin with minerals tablet 1 tablet  1 tablet Oral Daily Sanjuana Kava, NP   1 tablet at 08/27/13 [id number]  . nicotine (NICODERM CQ - dosed in mg/24 hours) patch 21 mg  21 mg Transdermal Q0600 Sanjuana Kava, NP   21 mg at 08/27/13 0615  . ondansetron (ZOFRAN-ODT) disintegrating tablet 4 mg  4 mg Oral Q6H PRN Sanjuana Kava, NP      . thiamine (B-1) injection 100 mg  100 mg Intramuscular Once Sanjuana Kava, NP      . thiamine (VITAMIN B-1) tablet 100 mg  100 mg Oral Daily Sanjuana Kava, NP   100 mg at 08/27/13 9629  . traZODone (DESYREL) tablet 50 mg  50 mg Oral QHS PRN Sanjuana Kava, NP   50 mg at 08/26/13 2141    Lab Results: No results found for this or any previous visit (from the past 48 hour(s)).  Physical Findings: AIMS: Facial and Oral Movements Muscles of Facial Expression: None, normal Jaw: None, normal Tongue: None, normal,Extremity Movements Upper (arms, wrists, hands, fingers): None, normal Lower (legs, knees, ankles, toes): None, normal, Trunk Movements Neck, shoulders, hips: None, normal, Overall Severity Severity of abnormal movements (highest score from questions above): None, normal Incapacitation due to abnormal movements: None, normal Patient's awareness of abnormal movements (rate only patient's report): No Awareness, Dental Status Current problems with teeth and/or dentures?: Yes (missing teeth) Does patient usually wear dentures?: No  CIWA:  CIWA-Ar Total: 4 COWS:     Treatment Plan Summary: Daily contact with patient to assess and evaluate symptoms and progress in treatment Medication management  Plan: 1. Continue crisis  management and stabilization.  2. Medication management: Continue Librium detox for alcohol dependence, Trazodone 50 mg Q bedtime for sleep, add Citalopram 20 mg daily for dperession.  3. Encouraged patient to attend groups and participate in group counseling sessions and activities.  4. Discharge plan in progress.  5. Continue current treatment plan.  6. Address health issues: Vitals reviewed and stable.   Medical Decision Making Problem Points:  Review of last therapy session (1) and Review of psycho-social stressors (1) Data Points:  Review of medication regiment & side effects (2) Review of new medications or change in dosage (2)  I certify that inpatient services furnished can reasonably be expected to improve the patient's condition.   Sanjuana Kava, PMHNP-BC 08/27/2013, 3:12 PM Agree with assessment and plan Madie Reno A. Dub Mikes, M.D.

## 2013-08-27 NOTE — Progress Notes (Signed)
Did not attended group 

## 2013-08-27 NOTE — [facility] Group Notes (Signed)
[facility] LCSW Group Therapy  08/27/2013 1:15 PM   Type of Therapy:  Group Therapy  Participation Level:  Did Not Attend - pt sleeping in his room  Reyes IvanChelsea Horton, LCSW 08/27/2013 2:26 PM

## 2013-08-27 NOTE — Progress Notes (Signed)
Pt reports he is doing a little better this evening.  His withdrawal symptoms are decreasing.  He says he has been attending groups.  He makes his needs known to staff.  Pt has been pleasant/cooperative.  He denies SI/HI/AV.  He says he is willing to go to long term rehab after detox.  Support and encouragement offered.  Safety maintained with q15 minute checks.

## 2013-08-27 NOTE — [facility] Group Notes (Signed)
[facility]   08/27/2013  8:45 AM  Participation Quality:  Did Not Attend - pt having low blood pressure and working with RN in his room  Bryce IvanChelsea Horton, LCSW 08/27/2013 9:23 AM

## 2013-08-27 NOTE — Progress Notes (Signed)
D: Patient denies SI/HI and A/V hallucinations; patient reports sleep to be; reports appetite to be ; reports energy level is ; reports ability to pay attention to; rates depression as -/10; rates hopelessness -/10; rates anxiety as -/10;   A: Monitored q 15 minutes; patient encouraged to attend groups; patient educated about medications; patient given medications per physician orders; patient encouraged to express feelings and/or concerns  R: Patient is cooperative but sad and depressed; patient is flat and depressed; patient's interaction with staff and peers is appropriate; patient was able to set goal to talk with staff 1:1 when having feelings of SI; patient is taking medications as prescribed and tolerating medications; patient is attending most  Groups but missed one group due to fatigue

## 2013-08-27 NOTE — Tx Team (Signed)
Interdisciplinary Treatment Plan Update (Adult)  Date: 08/27/2013  Time Reviewed:  9:45 AM  Progress in Treatment: Attending groups: Yes Participating in groups:  Yes Taking medication as prescribed:  Yes Tolerating medication:  Yes Family/Significant othe contact made: CSW will make attempts today Patient understands diagnosis:  Yes Discussing patient identified problems/goals with staff:  Yes Medical problems stabilized or resolved:  Yes Denies suicidal/homicidal ideation: Yes Issues/concerns per patient self-inventory:  Yes Other:  New problem(s) identified: N/A  Discharge Plan or Barriers: [facility] and therapy.  Reason for Continuation of Hospitalization: Anxiety Depression Detox Medication Stabilization  Comments: N/A  Estimated length of stay: 2-3 days  For review of initial/current patient goals, please see plan of care.  Attendees: Patient:     Family:     Physician:  Dr. Dub MikesLugo 08/27/2013 10:15 AM   Nursing:  Roswell Minersonna Shimp, RN 08/27/2013 10:15 AM   Clinical Social Worker:  Reyes Ivanhelsea Horton, LCSW 08/27/2013 10:15 AM   Other: Onnie BoerJennifer Clark, RN case manager 08/27/2013 10:15 AM   Other:  Trula SladeHeather Smart, LCSWA 08/27/2013 10:15 AM   Other:  Serena ColonelAggie Nwoko, NP 08/27/2013 10:15 AM   Other:  Leighton ParodyBritney Tyson, RN 08/27/2013 10:15 AM   Other:    Other:    Other:    Other:    Other:    Other:     Scribe for Treatment Team:   [name], Trini Soldo Nicole, 08/27/2013 , 10:15 AM

## 2013-08-27 NOTE — [facility] Suicide Risk Assessment (Signed)
[facility] INPATIENT:  Family/Significant Other Suicide Prevention Education  Suicide Prevention Education:  Contact Attempts: Christean LeafScott Alire - brother [phone], room 500), (name of family member/significant other) has been identified by the patient as the family member/significant other with whom the patient will be residing, and identified as the person(s) who will aid the patient in the event of a mental health crisis.  With written consent from the patient, two attempts were made to provide suicide prevention education, prior to and/or following the patient's discharge.  We were unsuccessful in providing suicide prevention education.  A suicide education pamphlet was given to the patient to share with family/significant other.  Date and time of first attempt: 08/26/13 @ 2:45 pm Date and time of second attempt: 08/28/13 @ 3:13 pm - CSW made attempts to reach pt's brother and both times the phone continues to ring, never connecting to an Designer, television/film setoperator or voicemail.    [name], [name] 08/27/2013, 3:16 PM

## 2013-08-28 NOTE — Progress Notes (Signed)
Adult Psychoeducational Group Note  Date:  08/28/2013 Time:  10:00AM Group Topic/Focus:  AA  Participation Level:  Active  Participation Quality:  Appropriate and Attentive  Affect:  Appropriate  Cognitive:  Alert and Appropriate  Insight: Appropriate  Engagement in Group:  Engaged  Modes of Intervention:  Discussion  Additional Comments:  PT. Attend AA group.   Bing PlumeScott, Abdifatah Colquhoun D 08/28/2013, 10:24 AM

## 2013-08-28 NOTE — Progress Notes (Signed)
Pt lying in bed in his room at this time, visibly shaking.  Pt says he started feeling bad after dinner.  He said the shaking and chills started a short time ago.  Pt also c/o nausea and has vomited once.  Pt is detoxing from alcohol.  Pt given Librium and Zofran along with gatorade at this time and encouraged to lie down.  Pt denies SI/HI/AV.  Pt considering long term rehab.  Pt makes his needs known to staff.  Support and encouragement offered.  Safety maintained with q15 minute checks.

## 2013-08-28 NOTE — Progress Notes (Signed)
Patient ID: Bryce MawRobert [name], male   DOB: 08-16-1960, 53 y.o.   MRN: [id number]  D: Patient lying in bed this am. Reports that he doesn't feel very well today. Reports some tremors this am. Reports some depression but denies any SI at this time. CIWA about a 6 at this time. Reports some nausea at lunch. Patient disheveled and has body odor. A: Staff will continue to monitor on q 15 minute checks, follow treatment plan, and give meds. R: Cooperative on the unit

## 2013-08-28 NOTE — [facility] Group Notes (Signed)
[facility] Group Notes:  (Nursing/MHT/Case Management/Adjunct)  Date:  08/28/2013  Time:  9:51 AM  Type of Therapy:  Psychoeducational Skills  Participation Level:  Minimal  Participation Quality:  Appropriate  Affect:  Appropriate  Cognitive:  Appropriate  Insight:  Appropriate  Engagement in Group:  Limited  Modes of Intervention:  Problem-solving  Summary of Progress/Problems: Morning wellness group; came in late but participated. Wants to know about long term treatment.  Manuela Schwartzritchett, [facility] 08/28/2013, 9:51 AM

## 2013-08-28 NOTE — [facility] Group Notes (Signed)
[facility] LCSW Group Therapy  08/28/2013  1:15 PM   Type of Therapy:  Group Therapy  Participation Level:  Active  Participation Quality:  Attentive, Sharing and Supportive  Affect:  Depressed and Flat  Cognitive:  Alert and Oriented  Insight:  Developing/Improving and Engaged  Engagement in Therapy:  Developing/Improving and Engaged  Modes of Intervention:  Clarification, Confrontation, Discussion, Education, Exploration, Limit-setting, Orientation, Problem-solving, Rapport Building, Dance movement psychotherapisteality Testing, Socialization and Support  Summary of Progress/Problems: The topic for group was balance in life.  Today's group focused on defining balance in one's own words, identifying things that can knock one off balance, and exploring healthy ways to maintain balance in life. Group members were asked to provide an example of a time when they felt off balance, describe how they handled that situation,and process healthier ways to regain balance in the future. Group members were asked to share the most important tool for maintaining balance that they [facility] and how they plan to apply this method after discharge.  Pt shared that he has been unable to work due to medical issues but has been too stubborn to apply for disability or Medicaid.  Pt states that he likes to drink too much, which has caused his life to be unbalanced due to not being able to do anything.  Pt was able to process how his drinking has thrown his life off balance, but had limited insight on the steps he needs to take to make changes in his life.  Pt actively participated and was engaged in group discussion.    Bryce IvanChelsea Horton, LCSW 08/28/2013  2:56 PM

## 2013-08-28 NOTE — Progress Notes (Signed)
Adult Psychoeducational Group Note  Date:  08/28/2013 Time:  9:05 PM  Group Topic/Focus:  Wrap-Up Group:   The focus of this group is to help patients review their daily goal of treatment and discuss progress on daily workbooks.  Participation Level:  Active  Participation Quality:  Appropriate  Affect:  Appropriate  Cognitive:  Appropriate  Insight: Appropriate  Engagement in Group:  Engaged  Modes of Intervention:  Discussion  Additional Comments:  Pt attended wrap-up group this evening and participate with peers   Carolena Fairbank A 08/28/2013, 9:05 PM 

## 2013-08-28 NOTE — Progress Notes (Signed)
[facility] MD Progress Note  08/28/2013 5:51 PM [name]  MRN:  [id number] Subjective:  [name] is concerned about his multiple medical conditions. He is concerned about his ability to work. Has no regular medical follow up. He is going to go back with his brother. States that he is going to avoid his friends. Does endorse and underlying depression that many times triggers the drinking Diagnosis:   DSM5: Schizophrenia Disorders:  none Obsessive-Compulsive Disorders:  none Trauma-Stressor Disorders:  none Substance/Addictive Disorders:  Alcohol Related Disorder - Severe (303.90) Depressive Disorders:  Major Depressive Disorder - Moderate (296.22) Total Time spent with patient: 30 minutes  Axis I: Substance Induced Mood Disorder  ADL's:  Intact  Sleep: Fair  Appetite:  Fair  Suicidal Ideation:  Plan:  denies Intent:  denies Means:  denies Homicidal Ideation:  Plan:  denies Intent:  denies Means:  denies AEB (as evidenced by):  Psychiatric Specialty Exam: Physical Exam  ROS  Blood pressure 156/92, pulse 88, temperature 98.1 F (36.7 C), temperature source Oral, resp. rate 18, height 2' 2.77" (0.68 m), weight 78.472 kg (173 lb).Body mass index is 169.71 kg/(m^2).  General Appearance: Fairly Groomed  Patent attorney::  Fair  Speech:  Clear and Coherent and Slow  Volume:  Decreased  Mood:  Anxious and worried  Affect:  anxious, worried  Thought Process:  Coherent and Goal Directed  Orientation:  Full (Time, Place, and Person)  Thought Content:  sympoms, worries, concerns  Suicidal Thoughts:  No  Homicidal Thoughts:  No  Memory:  Immediate;   Fair Recent;   Fair Remote;   Fair  Judgement:  Fair  Insight:  Present and Shallow  Psychomotor Activity:  Restlessness  Concentration:  Fair  Recall:  [employer] of Knowledge:NA  Language: Fair  Akathisia:  No  Handed:    AIMS (if indicated):     Assets:  Desire for Improvement Housing  Sleep:  Number of Hours: 5.75    Musculoskeletal: Strength & Muscle Tone: within normal limits Gait & Station: normal Patient leans: N/A  Current Medications: Current Facility-Administered Medications  Medication Dose Route Frequency Provider Last Rate Last Dose  . acetaminophen (TYLENOL) tablet 650 mg  650 mg Oral Q6H PRN Sanjuana Kava, NP   650 mg at 08/28/13 1701  . alum & mag hydroxide-simeth (MAALOX/MYLANTA) 200-200-20 MG/5ML suspension 30 mL  30 mL Oral Q4H PRN Sanjuana Kava, NP      . chlordiazePOXIDE (LIBRIUM) capsule 25 mg  25 mg Oral BH-qamhs Sanjuana Kava, NP   25 mg at 08/28/13 4098   Followed by  . [START ON 08/29/2013] chlordiazePOXIDE (LIBRIUM) capsule 25 mg  25 mg Oral Daily Sanjuana Kava, NP      . citalopram (CELEXA) tablet 20 mg  20 mg Oral Daily Sanjuana Kava, NP   20 mg at 08/28/13 1191  . lisinopril (PRINIVIL,ZESTRIL) tablet 10 mg  10 mg Oral Daily Sanjuana Kava, NP   10 mg at 08/28/13 4782  . magnesium hydroxide (MILK OF MAGNESIA) suspension 30 mL  30 mL Oral Daily PRN Sanjuana Kava, NP      . multivitamin with minerals tablet 1 tablet  1 tablet Oral Daily Sanjuana Kava, NP   1 tablet at 08/28/13 [id number]  . nicotine (NICODERM CQ - dosed in mg/24 hours) patch 21 mg  21 mg Transdermal Q0600 Sanjuana Kava, NP   21 mg at 08/28/13 1308  . thiamine (B-1) injection 100 mg  100 mg Intramuscular Once Sanjuana KavaAgnes I Nwoko, NP      . thiamine (VITAMIN B-1) tablet 100 mg  100 mg Oral Daily Sanjuana KavaAgnes I Nwoko, NP   100 mg at 08/28/13 [id number]  . traZODone (DESYREL) tablet 50 mg  50 mg Oral QHS PRN Sanjuana KavaAgnes I Nwoko, NP   50 mg at 08/27/13 2206    Lab Results: No results found for this or any previous visit (from the past 48 hour(s)).  Physical Findings: AIMS: Facial and Oral Movements Muscles of Facial Expression: None, normal Jaw: None, normal Tongue: None, normal,Extremity Movements Upper (arms, wrists, hands, fingers): None, normal Lower (legs, knees, ankles, toes): None, normal, Trunk Movements Neck, shoulders, hips: None,  normal, Overall Severity Severity of abnormal movements (highest score from questions above): None, normal Incapacitation due to abnormal movements: None, normal Patient's awareness of abnormal movements (rate only patient's report): No Awareness, Dental Status Current problems with teeth and/or dentures?: Yes (missing teeth) Does patient usually wear dentures?: No  CIWA:  CIWA-Ar Total: 6 COWS:     Treatment Plan Summary: Daily contact with patient to assess and evaluate symptoms and progress in treatment Medication management  Plan: Supportive approach/copign skills/relapse prevention           Reassess and address the co morbidities           Inform about medical services available to him  Medical Decision Making Problem Points:  Review of psycho-social stressors (1) Data Points:  Review of medication regiment & side effects (2)  I certify that inpatient services furnished can reasonably be expected to improve the patient's condition.   Georgena Weisheit A 08/28/2013, 5:51 PM

## 2013-08-29 MED ORDER — CITALOPRAM HYDROBROMIDE 20 MG PO TABS: 20.0000 mg | ORAL_TABLET | Freq: Every day | ORAL | Status: DC

## 2013-08-29 MED ORDER — TRAZODONE HCL 50 MG PO TABS: 50.0000 mg | ORAL_TABLET | Freq: Every evening | ORAL | Status: DC | PRN

## 2013-08-29 MED ORDER — LISINOPRIL 10 MG PO TABS: 10.0000 mg | ORAL_TABLET | Freq: Every day | ORAL | Status: DC

## 2013-08-29 NOTE — [facility] Suicide Risk Assessment (Signed)
Suicide Risk Assessment  Discharge Assessment     Demographic Factors:  Male and Caucasian  Total Time spent with patient: 45 minutes  Psychiatric Specialty Exam:     Blood pressure 122/77, pulse 111, temperature 99.8 F (37.7 C), temperature source Oral, resp. rate 18, height 2' 2.77" (0.68 m), weight 78.472 kg (173 lb).Body mass index is 169.71 kg/(m^2).  General Appearance: Fairly Groomed  Patent attorneyye Contact::  Fair  Speech:  Clear and Coherent and Slow  Volume:  Decreased  Mood:  worried  Affect:  Appropriate  Thought Process:  Coherent and Goal Directed  Orientation:  Full (Time, Place, and Person)  Thought Content:  relapse prevention plan  Suicidal Thoughts:  No  Homicidal Thoughts:  No  Memory:  Immediate;   Fair Recent;   Fair Remote;   Fair  Judgement:  Fair  Insight:  Present and Shallow  Psychomotor Activity:  Decreased  Concentration:  Fair  Recall:  [employer]Fair  Fund of Knowledge:NA  Language: Fair  Akathisia:  No  Handed:    AIMS (if indicated):     Assets:  Desire for Improvement Housing  Sleep:  Number of Hours: 5.5    Musculoskeletal: Strength & Muscle Tone: within normal limits Gait & Station: normal Patient leans: N/A   Mental Status Per Nursing Assessment::   On Admission:  Suicidal ideation indicated by patient;Self-harm behaviors  Current Mental Status by Physician: In full contact with reality. There are no active S/S of withdrawal. Willing to pursue outpatient follow up   Loss Factors: Decline in physical health  Historical Factors: NA  Risk Reduction Factors:   Living with another person, especially a relative  Continued Clinical Symptoms:  Depression:   Comorbid alcohol abuse/dependence Alcohol/Substance Abuse/Dependencies  Cognitive Features That Contribute To Risk:  Closed-mindedness Polarized thinking Thought constriction (tunnel vision)    Suicide Risk:  Minimal: No identifiable suicidal ideation.  Patients presenting with no  risk factors but with morbid ruminations; may be classified as minimal risk based on the severity of the depressive symptoms  Discharge Diagnoses:   AXIS I:  Alcohol Dependence, Major Depressive Disorder AXIS II:  No diagnosis AXIS III:   Past Medical History  Diagnosis Date  . ETOH abuse    AXIS IV:  other psychosocial or environmental problems AXIS V:  61-70 mild symptoms  Plan Of Care/Follow-up recommendations:  Activity:  as tolerated Diet:  regular Follow up outpatient basis  Is patient on multiple antipsychotic therapies at discharge:  No   Has Patient had three or more failed trials of antipsychotic monotherapy by history:  No  Recommended Plan for Multiple Antipsychotic Therapies: NA    Clarrisa Kaylor A 08/29/2013, 1:02 PM

## 2013-08-29 NOTE — [facility] Group Notes (Signed)
[facility]   08/29/2013 10:43 AM  Participation Quality:  DID NOT ATTEND-pt meeting with MD during group. He is scheduled for d/c today. Bus pass in chart.    Smart, State Street CorporationHeather LSCWA

## 2013-08-29 NOTE — Progress Notes (Signed)
D   Pt up and about on the unit  Pt is active in groups   He has body odor and is very tremulous to the point that it looks like he is putting on   He is pleasant and cooperative and has been compliant with treatment A   Verbal support given   Medications administered and effectiveness monitored   Q 15 min checks R   Pt safe at present

## 2013-08-29 NOTE — Progress Notes (Signed)
[facility] :  Will you be returning to the same living situation after discharge: Yes,  [facility] At discharge, do you have transportation home?:Yes,  bus pass in chart Do you have the ability to pay for your medications:Yes,  mental health  Release of information consent forms completed and submitted to medical records by CSW.   Patient to Follow up at: Follow-up Information   Follow up with [facility] On 09/02/2013. (Walk in on this date for hospital discharge appointment. Walk in clinic is Monday - Friday 8 am - 3 pm. They will than schedule you for medication management and therapy appts. )    Contact information:   [address] [place], [place] [address] Phone: [phone] Fax: [phone]      Patient denies SI/HI:   Yes,  during group/self report.     Safety Planning and Suicide Prevention discussed:  Yes,  Contact attempts made with pt's brother. No other family contacts per pt. SPE completed with pt and he was provided with SPI pamphlet, encouraged to share information with support network, ask questions, and talk about any concerns relating to SPE.  Smart, Aidaly Cordner LCSWA  08/29/2013, 11:05 AM

## 2013-08-29 NOTE — Discharge Summary (Signed)
Physician Discharge Summary Note  Patient:  [name] is an 53 y.o., male MRN:  [id number] DOB:  12-11-1960 Patient phone:  [phone] (home)  Patient address:   [address]  [place] [address],  Total Time spent with patient: Greater than 30 minutes  Date of Admission:  08/25/2013  Date of Discharge: 08/29/13  Reason for Admission:  Alcohol detox  Discharge Diagnoses: Active Problems:   Alcohol dependence   Substance induced mood disorder   Psychiatric Specialty Exam: Physical Exam  Psychiatric: His speech is normal and behavior is normal. Judgment and thought content normal. His mood appears not anxious. His affect is not angry. Cognition and memory are normal. He does not exhibit a depressed mood.    Review of Systems  Constitutional: Negative.   HENT: Negative.   Eyes: Negative.   Respiratory: Negative.   Cardiovascular: Negative.   Gastrointestinal: Negative.   Genitourinary: Negative.   Musculoskeletal: Negative.   Skin: Negative.   Neurological: Negative.   Endo/Heme/Allergies: Negative.   Psychiatric/Behavioral: Positive for depression (Stabilized with medication prior to discharge) and substance abuse (Alcoholism, chronic). Negative for suicidal ideas, hallucinations and memory loss. The patient has insomnia (Stabilized with medication prior to discharge). The patient is not nervous/anxious.     Blood pressure 122/77, pulse 111, temperature 99.8 F (37.7 C), temperature source Oral, resp. rate 18, height 2' 2.77" (0.68 m), weight 78.472 kg (173 lb).Body mass index is 169.71 kg/(m^2).   General Appearance: Fairly Groomed   Patent attorney:: Fair   Speech: Clear and Coherent and Slow   Volume: Decreased   Mood: worried   Affect: Appropriate   Thought Process: Coherent and Goal Directed   Orientation: Full (Time, Place, and Person)   Thought Content: relapse prevention plan   Suicidal Thoughts: No   Homicidal Thoughts: No   Memory: Immediate; Fair   Recent; Fair  Remote; Fair   Judgement: Fair   Insight: Present and Shallow   Psychomotor Activity: Decreased   Concentration: Fair   Recall: [employer] of Knowledge:NA   Language: Fair   Akathisia: No   Handed:   AIMS (if indicated):   Assets: Desire for Improvement  Housing   Sleep: Number of Hours: 5.5    Past Psychiatric History: Diagnosis: Alcohol dependence, Substance induced mood disorder  Hospitalizations: [facility] adult unit  Outpatient Care: [facility]  Substance Abuse Care: [facility]  Self-Mutilation: NA  Suicidal Attempts: NA  Violent Behaviors: NA   Musculoskeletal: Strength & Muscle Tone: within normal limits Gait & Station: normal Patient leans: N/A  DSM5: Schizophrenia Disorders:  NA Obsessive-Compulsive Disorders:  NA Trauma-Stressor Disorders:  NA Substance/Addictive Disorders:  Alcohol Related Disorder - Severe (303.90) Depressive Disorders:  Substance induced mood disorder  Axis Diagnosis:   AXIS I:  Alcohol dependence, Substance induced mood disorder AXIS II:  Deferred AXIS III:   Past Medical History  Diagnosis Date  . ETOH abuse    AXIS IV:  other psychosocial or environmental problems and Alcoholism, chronic AXIS V:  62  Level of Care:  OP  Hospital Course:  53 Y/O male who states he was drinking too much and started saying "crazy things." sates he was having suicidal thoughts. States he got real depressed over his family, has no one but his brother, family does not want anything to do with him. States he drinks 7 or 8 40 ounces if he gets the money and some friends come by. Has been drinking every day since he was last  in treatment years ago.  [name] was admitted to the hospital with a blood alcohol level of 300 per toxicology tests. He was intoxicated requiring detox treatment. He was ordered and received Librium detox protocols. He was also treated with Citalopram 20 mg daily for depression and Trazodone 50 mg Q bedtime for insomnia. He was  enrolled in group sessions and AA/NA meetings being offered on this unit. He participated and learned coping skills. He received medication management for his other medical issues that he presented. He tolerated his treatment regimen without adverse effects.  [name] has completed detox treatment and his mood stabilized. He is currently being discharged to [facility] for medication management, routine psychiatric care and counseling sessions. He has been provided with all the pertinent information required to make this appointment without problems. He also received 14 days worth, supply [facility]. Upon discharge, he adamantly denies any SIHI, AVH, delusional thought, paranoia and or withdrawal symptoms. [facility] in no distress. Transportation per city bus. [facility].    Consults:  psychiatry  Significant Diagnostic Studies:  labs: CBC with diff, CMP, UDS, toxicology tests, U/A  Discharge Vitals:   Blood pressure 122/77, pulse 111, temperature 99.8 F (37.7 C), temperature source Oral, resp. rate 18, height 2' 2.77" (0.68 m), weight 78.472 kg (173 lb). Body mass index is 169.71 kg/(m^2). Lab Results:   No results found for this or any previous visit (from the past 72 hour(s)).  Physical Findings: AIMS: Facial and Oral Movements Muscles of Facial Expression: None, normal Jaw: None, normal Tongue: None, normal,Extremity Movements Upper (arms, wrists, hands, fingers): None, normal Lower (legs, knees, ankles, toes): None, normal, Trunk Movements Neck, shoulders, hips: None, normal, Overall Severity Severity of abnormal movements (highest score from questions above): None, normal Incapacitation due to abnormal movements: None, normal Patient's awareness of abnormal movements (rate only patient's report): No Awareness, Dental Status Current problems with teeth and/or dentures?: Yes (missing teeth) Does  patient usually wear dentures?: No  CIWA:  CIWA-Ar Total: 5 COWS:     Psychiatric Specialty Exam: See Psychiatric Specialty Exam and Suicide Risk Assessment completed by Attending Physician prior to discharge.  Discharge destination:  Home  Is patient on multiple antipsychotic therapies at discharge:  No   Has Patient had three or more failed trials of antipsychotic monotherapy by history:  No  Recommended Plan for Multiple Antipsychotic Therapies: NA     Medication List       Indication   citalopram 20 MG tablet  Commonly known as:  CELEXA  Take 1 tablet (20 mg total) by mouth daily. For depression   Indication:  Depression     lisinopril 10 MG tablet  Commonly known as:  PRINIVIL,ZESTRIL  Take 1 tablet (10 mg total) by mouth daily. For high blood pressure   Indication:  High Blood Pressure     traZODone 50 MG tablet  Commonly known as:  DESYREL  Take 1 tablet (50 mg total) by mouth at bedtime as needed for sleep.   Indication:  Trouble Sleeping       Follow-up Information   Follow up with [facility] On 09/02/2013. (Walk in on this date for hospital discharge appointment. Walk in clinic is Monday - Friday 8 am - 3 pm. They will than schedule you for medication management and therapy appts. )    Contact information:   [address] [place], [place] [address] Phone:  [phone] Fax: [phone]     Follow-up recommendations:  Activity:  As tolerated Diet: As recommended by your primary care doctor. Keep all scheduled follow-up appointments as recommended.  Comments: Take all your medications as prescribed by your mental healthcare provider. Report any adverse effects and or reactions from your medicines to your outpatient provider promptly. Patient is instructed and cautioned to not engage in alcohol and or illegal drug use while on prescription medicines. In the event of worsening symptoms, patient is instructed to call the crisis hotline, 911 and or go to the nearest  ED for appropriate evaluation and treatment of symptoms. Follow-up with your primary care provider for your other medical issues, concerns and or health care needs.   Total Discharge Time:  Greater than 30 minutes.  Signed: Sanjuana Kava, PMHNP-BC 08/29/2013, 3:36 PM Personally evaluated the patient, and agree with assessment and plan Madie Reno A. Dub Mikes, M.D.

## 2013-08-29 NOTE — Progress Notes (Signed)
Patient ID: [name], male   DOB: [birth year], 53 y.o.   MRN: [id number]  D: Patient reports feeling better today. Finished with ETOH detox protocol this am. Currently denies any SI at this time.Treatment team met and felt patient ready for discharge. Patient did not want long term treatment. A: Obtained all belongings, prescriptions, sample meds, and follow-up appointment. Patient did satisfaction survey and given bus tickets. R: Patient given directions to bus stop

## 2013-09-03 NOTE — Progress Notes (Signed)
Patient Discharge Instructions:  After Visit Summary (AVS):   Faxed to:  09/03/13 Discharge Summary Note:   Faxed to:  09/03/13 Psychiatric Admission Assessment Note:   Faxed to:  09/03/13 Suicide Risk Assessment - Discharge Assessment:   Faxed to:  09/03/13 Faxed/Sent to the Next Level Care provider:  09/03/13 [facility] @ [phone]  Jerelene ReddenSheena E [place], 09/03/2013, 3:22 PM

## 2015-03-12 ENCOUNTER — Emergency Department (HOSPITAL_COMMUNITY): Payer: Self-pay

## 2015-03-12 ENCOUNTER — Inpatient Hospital Stay (HOSPITAL_COMMUNITY)
Admission: EM | Admit: 2015-03-12 | Discharge: 2015-03-15 | DRG: 433 | Disposition: A | Discharge status: Home / Self Care | Payer: Self-pay | Attending: Internal Medicine | Admitting: Internal Medicine

## 2015-03-12 ENCOUNTER — Encounter (HOSPITAL_COMMUNITY): Payer: Self-pay

## 2015-03-12 ENCOUNTER — Observation Stay (HOSPITAL_COMMUNITY): Payer: Self-pay

## 2015-03-12 DIAGNOSIS — Z833 Family history of diabetes mellitus: Secondary | ICD-10-CM

## 2015-03-12 DIAGNOSIS — R45851 Suicidal ideations: Secondary | ICD-10-CM | POA: Diagnosis present

## 2015-03-12 DIAGNOSIS — G834 Cauda equina syndrome: Secondary | ICD-10-CM

## 2015-03-12 DIAGNOSIS — D696 Thrombocytopenia, unspecified: Secondary | ICD-10-CM

## 2015-03-12 DIAGNOSIS — F329 Major depressive disorder, single episode, unspecified: Secondary | ICD-10-CM | POA: Diagnosis present

## 2015-03-12 DIAGNOSIS — F1010 Alcohol abuse, uncomplicated: Secondary | ICD-10-CM

## 2015-03-12 DIAGNOSIS — F10929 Alcohol use, unspecified with intoxication, unspecified: Secondary | ICD-10-CM

## 2015-03-12 DIAGNOSIS — K7030 Alcoholic cirrhosis of liver without ascites: Secondary | ICD-10-CM | POA: Diagnosis present

## 2015-03-12 DIAGNOSIS — R188 Other ascites: Secondary | ICD-10-CM | POA: Diagnosis present

## 2015-03-12 DIAGNOSIS — R32 Unspecified urinary incontinence: Secondary | ICD-10-CM | POA: Diagnosis present

## 2015-03-12 DIAGNOSIS — D7589 Other specified diseases of blood and blood-forming organs: Secondary | ICD-10-CM

## 2015-03-12 DIAGNOSIS — F1029 Alcohol dependence with unspecified alcohol-induced disorder: Secondary | ICD-10-CM

## 2015-03-12 DIAGNOSIS — K7011 Alcoholic hepatitis with ascites: Principal | ICD-10-CM

## 2015-03-12 DIAGNOSIS — F10229 Alcohol dependence with intoxication, unspecified: Secondary | ICD-10-CM | POA: Diagnosis present

## 2015-03-12 DIAGNOSIS — F10120 Alcohol abuse with intoxication, uncomplicated: Secondary | ICD-10-CM

## 2015-03-12 DIAGNOSIS — R1084 Generalized abdominal pain: Secondary | ICD-10-CM

## 2015-03-12 DIAGNOSIS — F17210 Nicotine dependence, cigarettes, uncomplicated: Secondary | ICD-10-CM | POA: Diagnosis present

## 2015-03-12 LAB — PROTIME-INR
INR: 1.25 (ref 0.00–1.49)
PROTHROMBIN TIME: 15.9 s — AB (ref 11.6–15.2)

## 2015-03-12 LAB — COMPREHENSIVE METABOLIC PANEL
ALK PHOS: 175 U/L — AB (ref 38–126)
ALT: 60 U/L (ref 17–63)
ANION GAP: 7 (ref 5–15)
AST: 200 U/L — ABNORMAL HIGH (ref 15–41)
Albumin: 2.8 g/dL — ABNORMAL LOW (ref 3.5–5.0)
BUN: 5 mg/dL — ABNORMAL LOW (ref 6–20)
CALCIUM: 7.9 mg/dL — AB (ref 8.9–10.3)
CO2: 27 mmol/L (ref 22–32)
CREATININE: 0.44 mg/dL — AB (ref 0.61–1.24)
Chloride: 102 mmol/L (ref 101–111)
GFR calc Af Amer: >60 mL/min (ref >60)
GFR calc non Af Amer: >60 mL/min (ref >60)
GLUCOSE: 112 mg/dL — AB (ref 65–99)
Potassium: 3.6 mmol/L (ref 3.5–5.1)
Sodium: 136 mmol/L (ref 135–145)
Total Bilirubin: 2.3 mg/dL — ABNORMAL HIGH (ref 0.3–1.2)
Total Protein: 6.3 g/dL — ABNORMAL LOW (ref 6.5–8.1)

## 2015-03-12 LAB — ETHANOL: ALCOHOL ETHYL (B): 322 mg/dL — AB (ref <5)

## 2015-03-12 LAB — CBC
HCT: 42.7 % (ref 39.0–52.0)
Hemoglobin: 14.6 g/dL (ref 13.0–17.0)
MCH: 35.3 pg — AB (ref 26.0–34.0)
MCHC: 34.2 g/dL (ref 30.0–36.0)
MCV: 103.1 fL — ABNORMAL HIGH (ref 78.0–100.0)
PLATELETS: 120 10*3/uL — AB (ref 150–400)
RBC: 4.14 MIL/uL — ABNORMAL LOW (ref 4.22–5.81)
RDW: 13.3 % (ref 11.5–15.5)
WBC: 9.4 10*3/uL (ref 4.0–10.5)

## 2015-03-12 LAB — LIPASE, BLOOD: Lipase: 61 U/L — ABNORMAL HIGH (ref 22–51)

## 2015-03-12 MED ORDER — ONDANSETRON HCL 4 MG PO TABS: 4.0000 mg | ORAL_TABLET | Freq: Four times a day (QID) | ORAL | Status: DC | PRN

## 2015-03-12 MED ORDER — SODIUM CHLORIDE 0.9 % IJ SOLN
3.0000 mL | Freq: Two times a day (BID) | INTRAMUSCULAR | Status: DC
Start: 2015-03-12 — End: 2015-03-15
  Administered 2015-03-12 – 2015-03-15 (×5): 3 mL via INTRAVENOUS

## 2015-03-12 MED ORDER — THIAMINE HCL 100 MG/ML IJ SOLN
100.0000 mg | Freq: Every day | INTRAMUSCULAR | Status: DC
Start: 2015-03-12 — End: 2015-03-15
  Administered 2015-03-12: 100 mg via INTRAVENOUS
  Filled 2015-03-12: qty 1
  Filled 2015-03-12: qty 2
  Filled 2015-03-12 (×2): qty 1

## 2015-03-12 MED ORDER — FOLIC ACID 1 MG PO TABS
1.0000 mg | ORAL_TABLET | Freq: Every day | ORAL | Status: DC
  Administered 2015-03-12 – 2015-03-15 (×4): 1 mg via ORAL
  Filled 2015-03-12 (×4): qty 1

## 2015-03-12 MED ORDER — LORAZEPAM 2 MG/ML IJ SOLN
1.0000 mg | Freq: Four times a day (QID) | INTRAMUSCULAR | Status: AC | PRN
  Administered 2015-03-13 – 2015-03-14 (×3): 1 mg via INTRAVENOUS
  Filled 2015-03-12 (×2): qty 1

## 2015-03-12 MED ORDER — MORPHINE SULFATE (PF) 2 MG/ML IV SOLN: 2.0000 mg | INTRAVENOUS | Status: DC | PRN

## 2015-03-12 MED ORDER — LORAZEPAM 1 MG PO TABS
1.0000 mg | ORAL_TABLET | Freq: Four times a day (QID) | ORAL | Status: AC | PRN
  Administered 2015-03-13: 1 mg via ORAL
  Filled 2015-03-12 (×6): qty 1

## 2015-03-12 MED ORDER — HEPARIN SODIUM (PORCINE) 5000 UNIT/ML IJ SOLN
5000.0000 [IU] | Freq: Three times a day (TID) | INTRAMUSCULAR | Status: DC
  Administered 2015-03-12: 5000 [IU] via SUBCUTANEOUS
  Filled 2015-03-12 (×6): qty 1

## 2015-03-12 MED ORDER — IOHEXOL 300 MG/ML  SOLN
25.0000 mL | Freq: Once | INTRAMUSCULAR | Status: AC | PRN
  Administered 2015-03-12: 25 mL via ORAL

## 2015-03-12 MED ORDER — DEXTROSE 5 % IV SOLN
2.0000 g | INTRAVENOUS | Status: DC
  Administered 2015-03-12 – 2015-03-14 (×3): 2 g via INTRAVENOUS
  Filled 2015-03-12 (×4): qty 2

## 2015-03-12 MED ORDER — SODIUM CHLORIDE 0.9 % IV BOLUS (SEPSIS)
1000.0000 mL | Freq: Once | INTRAVENOUS | Status: AC
  Administered 2015-03-12: 1000 mL via INTRAVENOUS

## 2015-03-12 MED ORDER — IOHEXOL 300 MG/ML  SOLN
100.0000 mL | Freq: Once | INTRAMUSCULAR | Status: AC | PRN
  Administered 2015-03-12: 100 mL via INTRAVENOUS

## 2015-03-12 MED ORDER — LORAZEPAM 2 MG/ML IJ SOLN
2.0000 mg | INTRAMUSCULAR | Status: DC | PRN
  Administered 2015-03-12 – 2015-03-13 (×2): 2 mg via INTRAVENOUS
  Filled 2015-03-12 (×3): qty 1

## 2015-03-12 MED ORDER — ADULT MULTIVITAMIN W/MINERALS CH
1.0000 | ORAL_TABLET | Freq: Every day | ORAL | Status: DC
  Administered 2015-03-12 – 2015-03-15 (×4): 1 via ORAL
  Filled 2015-03-12 (×4): qty 1

## 2015-03-12 MED ORDER — ONDANSETRON HCL 4 MG/2ML IJ SOLN
4.0000 mg | Freq: Four times a day (QID) | INTRAMUSCULAR | Status: DC | PRN
  Administered 2015-03-14: 4 mg via INTRAVENOUS
  Filled 2015-03-12: qty 2

## 2015-03-12 MED ORDER — VITAMIN B-1 100 MG PO TABS
100.0000 mg | ORAL_TABLET | Freq: Every day | ORAL | Status: DC
  Administered 2015-03-13 – 2015-03-15 (×3): 100 mg via ORAL
  Filled 2015-03-12 (×3): qty 1

## 2015-03-12 NOTE — ED Notes (Addendum)
Patient c/o abdominal pain and distention x 1 week.. Patient also c/o N/V/D. Patient also c/o having suicidal thoughts. Patient states his plan is to drink enough and never wake back up. Patient denies HI or auditory hallucinations. Patient states he sees smoke/ghost like images and spots. Patient states he last drank this AM at 0930.

## 2015-03-12 NOTE — ED Notes (Signed)
It was reported to this Clinical research associatewriter via the admission RN- that he fell while in bathroom during his CT. "He did not want to bother telling anyone." He did report that he hit his head (pointing to his forehead). No injury noted however he stated he had a headache. Pt denied any other complaints at this time. Pt alert and active with care. Charge LymanMacon T Charity fundraiserN and Former Scientist, product/process developmentCharge Alaina N RN made aware of findings.

## 2015-03-12 NOTE — H&P (Addendum)
[facility] History and Physical  Gorje Iyer ZOX:[id number] DOB: 1961-01-08 DOA: 03/12/2015  Referring physician: Emergency Department PCP: No primary care provider on file.  Specialists:   Chief Complaint: Abd pain  HPI: [name] is a 54 y.o. male  With a hx of alcohol abuse and depression well known to the behavioral health service who presented to the ED with complaints of one week of abd pain and distension. In the ED, serum ETOH was noted to be 322. LFT's were elevated, with AST over two times greater than ALT and with elevated bilirubin. Abd CT was notable for gallstones and for cirrhotic changes with ascites. Given complaint of abd pain, concerns for possible SBP. Pt was started on empiric rocephin and hospitalist consulted for consideration for admission.   On further questioning, pt also reports issues with bowel and bladder incontinence for the past several years and has never notified healthcare providers.   Review of Systems:  Review of Systems  Constitutional: Negative for fever and chills.  HENT: Negative for ear discharge and ear pain.   Eyes: Negative for pain and redness.  Respiratory: Negative for sputum production and shortness of breath.   Cardiovascular: Negative for chest pain and orthopnea.  Gastrointestinal: Positive for abdominal pain and diarrhea. Negative for nausea and vomiting.  Genitourinary: Positive for frequency. Negative for flank pain.  Musculoskeletal: Negative for falls and neck pain.  Skin: Negative for itching and rash.  Neurological: Negative for tingling, tremors and loss of consciousness.  Psychiatric/Behavioral: Positive for depression. Negative for suicidal ideas and hallucinations. The patient is not nervous/anxious.      Past Medical History  Diagnosis Date  . ETOH abuse    Past Surgical History  Procedure Laterality Date  . No past surgeries      jaw surgery 82yrs ago   Social History:  reports that he has been smoking  Cigarettes.  He has a 40 pack-year smoking history. He has never used smokeless tobacco. He reports that he drinks about 4.8 oz of alcohol per week. He reports that he does not use illicit drugs.  where does patient live--home, ALF, SNF? and with whom if at home?  Can patient participate in ADLs?  No Known Allergies  Family History  Problem Relation Age of Onset  . Diabetes Mother     (be sure to complete)  Prior to Admission medications   Medication Sig Start Date End Date Taking? Authorizing Provider  citalopram (CELEXA) 20 MG tablet Take 1 tablet (20 mg total) by mouth daily. For depression 08/29/13   Sanjuana Kava, NP  lisinopril (PRINIVIL,ZESTRIL) 10 MG tablet Take 1 tablet (10 mg total) by mouth daily. For high blood pressure 08/29/13   Sanjuana Kava, NP  traZODone (DESYREL) 50 MG tablet Take 1 tablet (50 mg total) by mouth at bedtime as needed for sleep. 08/29/13   Sanjuana Kava, NP   Physical Exam: Filed Vitals:   03/12/15 1140  BP: 129/96  Pulse: 110  Temp: 97.4 F (36.3 C)  TempSrc: Oral  Height:  (1.854 m)  Weight: 81.647 kg (180 lb)  SpO2: 99%     General:  Awake, in nad  Eyes: PERRL B  ENT: membranes moist, dentition fair  Neck: trachea midline, neck supple  Cardiovascular: regular, s1, s2  Respiratory: normal resp effort, no wheezing  Abdomen: mildly distended, diffusely tender, pos BS  Skin: normal skin turgor, no abnormal skin lesions seen  Musculoskeletal: perfused, no clubbing  Psychiatric: appears depressed,  sad, no auditory/visual hallucinations  Neurologic: cn2-12 grossly intact, strength/sensation intact  Labs on Admission:  Basic Metabolic Panel:  Recent Labs Lab 03/12/15 1226  NA 136  K 3.6  CL 102  CO2 27  GLUCOSE 112*  BUN 5*  CREATININE 0.44*  CALCIUM 7.9*   Liver Function Tests:  Recent Labs Lab 03/12/15 1226  AST 200*  ALT 60  ALKPHOS 175*  BILITOT 2.3*  PROT 6.3*  ALBUMIN 2.8*    Recent Labs Lab  03/12/15 1226  LIPASE 61*   No results for input(s): AMMONIA in the last 168 hours. CBC:  Recent Labs Lab 03/12/15 1226  WBC 9.4  HGB 14.6  HCT 42.7  MCV 103.1*  PLT 120*   Cardiac Enzymes: No results for input(s): CKTOTAL, CKMB, CKMBINDEX, TROPONINI in the last 168 hours.  BNP (last 3 results) No results for input(s): BNP in the last 8760 hours.  ProBNP (last 3 results) No results for input(s): PROBNP in the last 8760 hours.  CBG: No results for input(s): GLUCAP in the last 168 hours.  Radiological Exams on Admission: Ct Abdomen Pelvis W Contrast  03/12/2015  CLINICAL DATA:  Abdominal pain and distention for 1 week. Left lower quadrant pain evaluate for diverticulitis EXAM: CT ABDOMEN AND PELVIS WITH CONTRAST TECHNIQUE: Multidetector CT imaging of the abdomen and pelvis was performed using the standard protocol following bolus administration of intravenous contrast. CONTRAST:  OMNIPAQUE IOHEXOL 300 MG/ML SOLN, 25mL OMNIPAQUE IOHEXOL 300 MG/ML SOLN COMPARISON:  None. FINDINGS: Lung bases are clear.  No effusions.  Heart is normal size. Heterogeneous appearance of the liver with nodular contours. Findings compatible with cirrhosis. There is associated moderate ascites in the abdomen and pelvis. There are gallstones within the gallbladder. Gallbladder is distended. No biliary ductal dilatation. Spleen, pancreas, adrenals are unremarkable. Small cysts in the kidneys bilaterally. No hydronephrosis. Aorta and iliac vessels are calcified, non aneurysmal. Urinary bladder is distended. Stomach and small bowel are decompressed and unremarkable. Large bowel decompressed, grossly unremarkable. Large bowel decompressed, grossly unremarkable. No adenopathy. No acute bony abnormality or focal bone lesion. IMPRESSION: Changes of cirrhosis with associated moderate ascites. Layering gallstones within the gallbladder. Gallbladder is moderately distended. No evidence of diverticulosis or  diverticulitis. Electronically Signed   By: Charlett Nose M.D.   On: 03/12/2015 14:44   Assessment/Plan Principal Problem:   Alcoholic hepatitis with ascites Active Problems:   Ascites   Alcohol abuse   Alcohol intoxication (HCC)   Thrombocytopenia (HCC)   Macrocytosis without anemia   Hyperbilirubinemia   1. Alcoholic hepatitis with ascites 1. AST/ALT is just over 2:1 with concurrent elevated bili 2. ETOH level of 322 on admit 3. Would avoid hepatotoxic agents 4. Admit to med-tele 2. Alcoholic cirrhosis 1. Liver with cirrhotic features on CT 3. Bowel/bladder incontinence 1. No paraesthesias in LE B with good strength on exam 2. Will obtain MRI lumbar spine to r/o cord compresion  4. Ascites, possible SBP 1. Pt with presenting abd pain with moderate ascites on CT 2. Empiric rocephin started 3. Patient is afebrile with no leukocytosis 4. Will request IR assistance for diagnostic paracentesis 5. Thrombocytopenia 1. Likely secondary to liver disease per above 2. No signs of active bleeding at this time 3. Follow CBC 6. ETOH abuse 1. Alcohol level of 322 on presentation 2. Will continue on CIWA 3. Pt admits to drinking 5-6 "40's" per day. Last intake was on AM of admission 7. DVT prophylaxis 1. Heparin subQ 8. Depression 1. Near tearful  during encounter in ED 2. Pt states he "just want to die in my sleep" 3. Patient currently denies active or specific suicidal ideations/intentions  Code Status: Full Family Communication: Pt in room  Disposition Plan: Admit med-tele   Cherysh Epperly, Scheryl MartenSTEPHEN K [facility] Pager [phone]  If 7PM-7AM, please contact night-coverage [email or web address] Password [email or web address] 03/12/2015, 3:30 PM

## 2015-03-12 NOTE — Progress Notes (Signed)
Patient states he fell while in x-ray. He states he hit is head and his forehead still hurts. Patient states he did not inform anyone of this. I told pt's rn Shawn StallAshley Lassiter RN what pt said about fall. Briscoe Burns. Carleane Wacey Zieger BSN, RN-BC Admissions RN  03/12/2015 4:27 PM

## 2015-03-12 NOTE — ED Notes (Signed)
Patient still unable to provide a urine sample at this time. Urinal is at the bedside.

## 2015-03-12 NOTE — ED Notes (Signed)
MADE AWARE PT IS IN MRI. PENDING UA. UPDATED ON PT'S CURRENT STATUS. PT WILL GO TO FLOOR AFTER MRI.

## 2015-03-12 NOTE — ED Notes (Signed)
Nurse drawing labs. 

## 2015-03-12 NOTE — ED Notes (Signed)
Pt with increased anxiety in tight spaces. Pt questioned about MRI. Pt is requesting medication. Admitting MD paged. Awaiting response. MRI notified of plan of care for pt.

## 2015-03-12 NOTE — ED Notes (Signed)
LAB CALLS AND REPORTS ONLY ENOUGH URINE COLLECTED TO RUN URINE CULTURE. URINE DRUG AND UA STILL PENDING

## 2015-03-12 NOTE — ED Notes (Signed)
Patient transported to MRI 

## 2015-03-12 NOTE — ED Notes (Signed)
Pt reported to admitting Nurse that he fell when he went to use the BR.  He reports that he fell face down on the BR floor hitting his forehead.  Pt reports mild h/a but other wise feels "okay."  Pt is A&Ox 4.  No obvious neuro deficits noted at this time   [facility] notified

## 2015-03-12 NOTE — ED Provider Notes (Signed)
CSN: [id number]     Arrival date & time 03/12/15  1130 History   First MD Initiated Contact with Patient 03/12/15 1150     Chief Complaint  Patient presents with  . Emesis  . Abdominal Pain  . Suicidal     (Consider location/radiation/quality/duration/timing/severity/associated sxs/prior Treatment) Patient is a 54 y.o. male presenting with vomiting and abdominal pain.  Emesis Severity:  Moderate Associated symptoms: abdominal pain and diarrhea   Associated symptoms: no headaches and no sore throat   Abdominal Pain Pain location:  Epigastric Pain quality: fullness (tightness)   Pain radiates to:  Back (generalized) Pain severity:  Severe Onset quality:  Gradual Duration:  2 weeks Timing:  Constant Progression:  Worsening Chronicity:  New Context: alcohol use (4-6 40oz per day)   Relieved by:  Nothing Worsened by:  Nothing tried Ineffective treatments:  None tried Associated symptoms: diarrhea, flatus, nausea and vomiting (3-4x/day, dry heaves)   Associated symptoms: no chest pain, no dysuria, no fever, no shortness of breath and no sore throat  Anorexia: low appetite.     Past Medical History  Diagnosis Date  . ETOH abuse    Past Surgical History  Procedure Laterality Date  . No past surgeries      jaw surgery 2722yrs ago   Family History  Problem Relation Age of Onset  . Diabetes Mother    Social History  Substance Use Topics  . Smoking status: Current Every Day Smoker -- 1.00 packs/day for 40 years    Types: Cigarettes  . Smokeless tobacco: Never Used  . Alcohol Use: 4.8 oz/week    8 Cans of beer per week     Comment: drinks daily-drinks whatever is available    Review of Systems  Constitutional: Negative for fever.  HENT: Negative for sore throat.   Eyes: Negative for visual disturbance.  Respiratory: Negative for shortness of breath.   Cardiovascular: Negative for chest pain.  Gastrointestinal: Positive for nausea, vomiting (3-4x/day, dry heaves),  abdominal pain, diarrhea, abdominal distention and flatus. Negative for blood in stool. Anorexia: low appetite.  Genitourinary: Positive for difficulty urinating. Negative for dysuria.  Musculoskeletal: Positive for back pain (years). Negative for neck stiffness.  Skin: Negative for rash.  Neurological: Negative for syncope, light-headedness and headaches.      Allergies  Review of patient's allergies indicates no known allergies.  Home Medications   Prior to Admission medications   Medication Sig Start Date End Date Taking? Authorizing Provider  citalopram (CELEXA) 20 MG tablet Take 1 tablet (20 mg total) by mouth daily. For depression 08/29/13   Sanjuana KavaAgnes I Nwoko, NP  lisinopril (PRINIVIL,ZESTRIL) 10 MG tablet Take 1 tablet (10 mg total) by mouth daily. For high blood pressure 08/29/13   Sanjuana KavaAgnes I Nwoko, NP  traZODone (DESYREL) 50 MG tablet Take 1 tablet (50 mg total) by mouth at bedtime as needed for sleep. 08/29/13   Sanjuana KavaAgnes I Nwoko, NP   BP 136/88 mmHg  Pulse 103  Temp(Src) 98 F (36.7 C) (Oral)  Resp 20  Ht 6\' 1"  (1.854 m)  Wt 180 lb (81.647 kg)  BMI 23.75 kg/m2  SpO2 98% Physical Exam  Constitutional: He is oriented to person, place, and time. He appears well-developed and well-nourished. No distress.  HENT:  Head: Normocephalic and atraumatic.  Eyes: Conjunctivae and EOM are normal.  Neck: Normal range of motion.  Cardiovascular: Normal rate, regular rhythm, normal heart sounds and intact distal pulses.  Exam reveals no gallop and no friction rub.  No murmur heard. Pulmonary/Chest: Effort normal and breath sounds normal. No respiratory distress. He has no wheezes. He has no rales.  Abdominal: Soft. He exhibits distension and ascites. There is tenderness in the left upper quadrant and left lower quadrant. There is no guarding.  Musculoskeletal: He exhibits no edema.  Neurological: He is alert and oriented to person, place, and time.  Skin: Skin is warm and dry. He is not  diaphoretic.  Nursing note and vitals reviewed.   ED Course  Procedures (including critical care time) Labs Review Labs Reviewed  LIPASE, BLOOD - Abnormal; Notable for the following:    Lipase 61 (*)    All other components within normal limits  COMPREHENSIVE METABOLIC PANEL - Abnormal; Notable for the following:    Glucose, Bld 112 (*)    BUN 5 (*)    Creatinine, Ser 0.44 (*)    Calcium 7.9 (*)    Total Protein 6.3 (*)    Albumin 2.8 (*)    AST 200 (*)    Alkaline Phosphatase 175 (*)    Total Bilirubin 2.3 (*)    All other components within normal limits  CBC - Abnormal; Notable for the following:    RBC 4.14 (*)    MCV 103.1 (*)    MCH 35.3 (*)    Platelets 120 (*)    All other components within normal limits  ETHANOL - Abnormal; Notable for the following:    Alcohol, Ethyl (B) 322 (*)    All other components within normal limits  PROTIME-INR - Abnormal; Notable for the following:    Prothrombin Time 15.9 (*)    All other components within normal limits  URINE CULTURE  URINALYSIS, ROUTINE W REFLEX MICROSCOPIC (NOT AT [facility])  URINE RAPID DRUG SCREEN, HOSP PERFORMED  VITAMIN B12  FOLATE RBC  COMPREHENSIVE METABOLIC PANEL  CBC    Imaging Review Ct Head Wo Contrast  03/12/2015  CLINICAL DATA:  Status post fall today.  Initial encounter. EXAM: CT HEAD WITHOUT CONTRAST TECHNIQUE: Contiguous axial images were obtained from the base of the skull through the vertex without intravenous contrast. COMPARISON:  None. FINDINGS: There is mild atrophy and chronic microvascular ischemic change. No evidence of acute abnormality including hemorrhage, infarct, mass lesion, mass effect, midline shift or abnormal extra-axial fluid collection is identified. No hydrocephalus or pneumocephalus. The calvarium is intact. Minimal mucosal thickening is seen in the maxillary sinuses and there is mild ethmoid air cell disease. The patient is status post maxillary antrostomy. Carotid atherosclerosis  is noted. IMPRESSION: No acute abnormality. Mild atrophy and chronic microvascular ischemic change. Atherosclerosis. Mild sinus disease. Electronically Signed   By: Drusilla Kanner M.D.   On: 03/12/2015 16:57   Mr Lumbar Spine Wo Contrast  03/12/2015  CLINICAL DATA:  Bowel and bladder incontinence.  Cirrhosis EXAM: MRI LUMBAR SPINE WITHOUT CONTRAST TECHNIQUE: Multiplanar, multisequence MR imaging of the lumbar spine was performed. No intravenous contrast was administered. COMPARISON:  CT abdomen pelvis 03/12/2015 FINDINGS: The patient was not able to complete the study. Sagittal images were obtained which are of adequate quality. Axial images not obtained. Normal lumbar alignment. Negative for fracture or mass. No significant disc degeneration or disc protrusion. There is prominent epidural lipomatosis narrowing the canal at L5 and S1. Conus medullaris normal. Markedly enlarged urinary bladder. Recommend Foley catheter. Ascites also present. IMPRESSION: No significant disc protrusion or spinal stenosis. 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 Conus medullaris normal Markedly distended urinary bladder.  Ascites. Electronically Signed  By: Marlan Palau M.D.   On: 03/12/2015 20:29   Ct Abdomen Pelvis W Contrast  03/12/2015  CLINICAL DATA:  Abdominal pain and distention for 1 week. Left lower quadrant pain evaluate for diverticulitis EXAM: CT ABDOMEN AND PELVIS WITH CONTRAST TECHNIQUE: Multidetector CT imaging of the abdomen and pelvis was performed using the standard protocol following bolus administration of intravenous contrast. CONTRAST:  OMNIPAQUE IOHEXOL 300 MG/ML SOLN, 25mL OMNIPAQUE IOHEXOL 300 MG/ML SOLN COMPARISON:  None. FINDINGS: Lung bases are clear.  No effusions.  Heart is normal size. Heterogeneous appearance of the liver with nodular contours. Findings compatible with cirrhosis. There is associated moderate ascites in the abdomen and pelvis. There are gallstones within the gallbladder.  Gallbladder is distended. No biliary ductal dilatation. Spleen, pancreas, adrenals are unremarkable. Small cysts in the kidneys bilaterally. No hydronephrosis. Aorta and iliac vessels are calcified, non aneurysmal. Urinary bladder is distended. Stomach and small bowel are decompressed and unremarkable. Large bowel decompressed, grossly unremarkable. Large bowel decompressed, grossly unremarkable. No adenopathy. No acute bony abnormality or focal bone lesion. IMPRESSION: Changes of cirrhosis with associated moderate ascites. Layering gallstones within the gallbladder. Gallbladder is moderately distended. No evidence of diverticulosis or diverticulitis. Electronically Signed   By: Charlett Nose M.D.   On: 03/12/2015 14:44   I have personally reviewed and evaluated these images and lab results as part of my medical decision-making.   EKG Interpretation   Date/Time:  Friday March 12 2015 16:18:52 EDT Ventricular Rate:  82 PR Interval:  145 QRS Duration: 102 QT Interval:  404 QTC Calculation: 472 R Axis:   90 Text Interpretation:  Sinus rhythm Borderline right axis deviation TW  inversion in aVL similar to prior Confirmed by [facility] MD, Yuleidy Rappleye ([id number])  on 03/13/2015 12:05:49 AM      MDM   Final diagnoses:  Ascites  Alcohol dependence with unspecified alcohol-induced disorder (HCC)  Generalized abdominal pain   54yo male with history of ETOH abuse presents with concern for abdominal pain and distention for 1 week.  No sign of pancreatitis. Signs of acute alcoholic hepatitis. [place] and CT show signs of ascites and cirrhosis without other acute abnormalities.  Pt with new development of ascites and abdominal pain by history and given abdominal pain and ascites have concern for possible SBP.  Examined ascites on [place] however do not detect area appropriate for bedside paracentesis. Given rocephin 2g. Awaiting urinalysis.  Of note, patient also has had problems with stool and urinary overflow  incontinence that have been present for last year, and while there are no acute changes, feel spinal pathology should be evaluated as inpt or close outpt.  Pt to be admitted to hospitalist for further care for ascites/rule out SBP.  Pt reports passive suicidal ideation, wanting to sleep and not wake up, however is intoxicated at this time and will defer further psychiatry evaluation.   Alvira Monday, MD 03/13/15 [phone]

## 2015-03-12 NOTE — ED Notes (Signed)
Aware of need for urine. Urinal present

## 2015-03-12 NOTE — ED Notes (Signed)
Patient aware that a urine sample is needed. Urinal is at the bedside.  

## 2015-03-12 NOTE — ED Notes (Signed)
SECURITY CALLED FOR CLEARANCE FOR SAFETY FOR THIS PT

## 2015-03-12 NOTE — ED Notes (Signed)
Spoke with US - US can be done tomorrow

## 2015-03-12 NOTE — ED Notes (Signed)
Patient aware that another urine sample is needed.

## 2015-03-12 NOTE — ED Notes (Signed)
While completing patient care, I overheard the patient tell the admission nurse that they had fallen earlier in the bathroom. I then asked when they fell for this was the first time I had heard about it. The patient responded and said when he went to xray he stopped at the bathroom and ended up falling and hitting his head but he got back up and was okay. Patient stated he DID NOT tell the xray tech or anyone about the fall (until now) because he was "okay". Patient said his head was sore from the fall. Marya FossaAshley Lasiter, RN was notified.

## 2015-03-12 NOTE — ED Notes (Signed)
Delay on lab draw, pt not in room 

## 2015-03-12 NOTE — ED Notes (Signed)
Patient transported to CT for CT OF HEAD POST FALL

## 2015-03-12 NOTE — ED Notes (Addendum)
Per CT tech, pt had large copious loose liquid diarrhea stool in CT. Pt's scrubs changed out by CT. Pt placed on contact precaution

## 2015-03-12 NOTE — ED Notes (Signed)
MD at bedside. 

## 2015-03-12 NOTE — ED Notes (Signed)
DELAY IN TRANSFER- PT GOING TO MRI. 5 EAST AND RN  MADE AWARE.

## 2015-03-12 NOTE — ED Notes (Signed)
Attempted IV start x2, both blew. Will find 2nd RN to try. Phlebotomy at bedside to draw blood

## 2015-03-13 ENCOUNTER — Observation Stay (HOSPITAL_COMMUNITY): Payer: Self-pay

## 2015-03-13 DIAGNOSIS — F1020 Alcohol dependence, uncomplicated: Secondary | ICD-10-CM

## 2015-03-13 DIAGNOSIS — F1010 Alcohol abuse, uncomplicated: Secondary | ICD-10-CM

## 2015-03-13 LAB — MAGNESIUM: MAGNESIUM: 1.6 mg/dL — AB (ref 1.7–2.4)

## 2015-03-13 LAB — URINALYSIS, ROUTINE W REFLEX MICROSCOPIC
BILIRUBIN URINE: NEGATIVE
GLUCOSE, UA: NEGATIVE mg/dL
Hgb urine dipstick: NEGATIVE
Ketones, ur: NEGATIVE mg/dL
Leukocytes, UA: NEGATIVE
Nitrite: NEGATIVE
PROTEIN: NEGATIVE mg/dL
Specific Gravity, Urine: 1.026 (ref 1.005–1.030)
UROBILINOGEN UA: 1 mg/dL (ref 0.0–1.0)
pH: 6 (ref 5.0–8.0)

## 2015-03-13 LAB — CBC
HEMATOCRIT: 39 % (ref 39.0–52.0)
Hemoglobin: 13.5 g/dL (ref 13.0–17.0)
MCH: 35.2 pg — AB (ref 26.0–34.0)
MCHC: 34.6 g/dL (ref 30.0–36.0)
MCV: 101.8 fL — AB (ref 78.0–100.0)
Platelets: 90 10*3/uL — ABNORMAL LOW (ref 150–400)
RBC: 3.83 MIL/uL — ABNORMAL LOW (ref 4.22–5.81)
RDW: 13.1 % (ref 11.5–15.5)
WBC: 6.5 10*3/uL (ref 4.0–10.5)

## 2015-03-13 LAB — BODY FLUID CELL COUNT WITH DIFFERENTIAL
LYMPHS FL: 45 %
MONOCYTE-MACROPHAGE-SEROUS FLUID: 55 % (ref 50–90)
WBC FLUID: 98 uL (ref 0–1000)

## 2015-03-13 LAB — COMPREHENSIVE METABOLIC PANEL
ALBUMIN: 2.5 g/dL — AB (ref 3.5–5.0)
ALT: 52 U/L (ref 17–63)
AST: 164 U/L — AB (ref 15–41)
Alkaline Phosphatase: 159 U/L — ABNORMAL HIGH (ref 38–126)
Anion gap: 10 (ref 5–15)
BUN: 5 mg/dL — AB (ref 6–20)
CHLORIDE: 100 mmol/L — AB (ref 101–111)
CO2: 27 mmol/L (ref 22–32)
CREATININE: 0.45 mg/dL — AB (ref 0.61–1.24)
Calcium: 8.2 mg/dL — ABNORMAL LOW (ref 8.9–10.3)
GFR calc Af Amer: >60 mL/min (ref >60)
GFR calc non Af Amer: >60 mL/min (ref >60)
GLUCOSE: 92 mg/dL (ref 65–99)
POTASSIUM: 3.6 mmol/L (ref 3.5–5.1)
Sodium: 137 mmol/L (ref 135–145)
Total Bilirubin: 3.1 mg/dL — ABNORMAL HIGH (ref 0.3–1.2)
Total Protein: 6 g/dL — ABNORMAL LOW (ref 6.5–8.1)

## 2015-03-13 LAB — URINE CULTURE: CULTURE: NO GROWTH

## 2015-03-13 LAB — RAPID URINE DRUG SCREEN, HOSP PERFORMED
AMPHETAMINES: NOT DETECTED
Barbiturates: NOT DETECTED
Benzodiazepines: NOT DETECTED
Cocaine: NOT DETECTED
Opiates: NOT DETECTED
Tetrahydrocannabinol: POSITIVE — AB

## 2015-03-13 LAB — ALBUMIN, FLUID (OTHER): Albumin, Fluid: <1 g/dL

## 2015-03-13 LAB — GRAM STAIN

## 2015-03-13 LAB — VITAMIN B12: VITAMIN B 12: 1422 pg/mL — AB (ref 180–914)

## 2015-03-13 MED ORDER — MAGNESIUM SULFATE 2 GM/50ML IV SOLN
2.0000 g | Freq: Once | INTRAVENOUS | Status: AC
  Administered 2015-03-13: 2 g via INTRAVENOUS
  Filled 2015-03-13: qty 50

## 2015-03-13 MED ORDER — LORAZEPAM 1 MG PO TABS
1.0000 mg | ORAL_TABLET | ORAL | Status: DC
  Administered 2015-03-13 – 2015-03-15 (×9): 1 mg via ORAL
  Filled 2015-03-13 (×5): qty 1

## 2015-03-13 NOTE — Procedures (Signed)
  US guided LLQ para 2.5 liters yellow fluid Sent for labs  Tolerated well

## 2015-03-13 NOTE — Consult Note (Addendum)
[place] [facility] Consult   Reason for Consult:  Alcohol dependence , depression Referring Physician:  Dr Wyline Copas  Patient Identification: [name] MRN:  [id number] Principal Diagnosis: Alcoholic hepatitis with ascites Diagnosis:   Patient Active Problem List   Diagnosis Date Noted  . Ascites [R18.8] 03/12/2015  . Alcoholic hepatitis with ascites [K70.11] 03/12/2015  . Alcohol abuse [F10.10] 03/12/2015  . Alcohol intoxication ([place]) [F10.129] 03/12/2015  . Thrombocytopenia ([place]) [D69.6] 03/12/2015  . Macrocytosis without anemia [D75.89] 03/12/2015  . Hyperbilirubinemia [E80.6] 03/12/2015  . Substance induced mood disorder ([place]) [F19.94] 08/26/2013  . Alcohol dependence ([place]) [F10.20] 08/25/2013    Total Time spent with patient: 40 minutes   Subjective:   [name] is a 54 y.o. male patient admitted with  Alcohol dependence   HPI:  I have reviewed chart, discussed case with Dr. Wyline Copas, RN.  [name] is a  54 year old male, long history of alcohol dependence - states he has been drinking 6-7 40 oz beers per day. Admission BAL 322. Presented to ED due to abdominal discomfort, distension. He has a history Alcohol related liver cirrhosis. He also reports limited ability to walk recently, feeling uncoordinated , and some peripheral " tingling", which may be indicative  Of alcohol related peripheral neuropathy or alcohol related cerebellar damage . He has a history of depression, and report is that yesterday he was expressing passive SI, saying he wanted to go to sleep and not wake up. At this time he is presenting with some symptoms of alcohol withdrawal- has some distal tremors, appears vaguely restless . He denies any visual disturbances or hallucinations, has had no seizures, and is not delirious - at this time fully alert, attentive, and oriented to [facility] hospital, month, year, person.  Regarding depression, he endorses sadness, which he states is chronic. Denies any current plan  or intention of suicide or of wanting to hurt self. Does describe anhedonia, pervasive sadness, and appetite has been poor . As reviewed with staff, has received  Total of 3 mgrs of Ativan this AM- he remains vaguely tremulous , tachycardic .  Past Psychiatric History: Patient has a long history of alcohol dependence- he has been drinking daily and heavily. States he has had " rough" withdrawal syndromes in the past, but denies history of WDL seizures. It is unclear if he has had DTs in the past- does not endorse . He reports long history of depression, likely aggravated by alcohol dependence. He states he has been on  Prozac, which did not help, and on Zoloft , which he does remember as helpful. He has not been on any psychiatric medication recently. As per chart, patient had one prior admission to [facility] inpatient in 2015 for alcohol dependence and depression, at which time he was discharged on Celexa. Patient  does not remember  Being on this medication. Of note he states he had a period of 5 years of sobriety in the past ,  About 10-15 years ago.   Risk to Self: Is patient at risk for suicide?: Yes Risk to Others:   Prior Inpatient Therapy:   Prior Outpatient Therapy:    Past Medical History:  Past Medical History  Diagnosis Date  . ETOH abuse     Past Surgical History  Procedure Laterality Date  . No past surgeries      jaw surgery 48yr ago   Family History:  Family History  Problem Relation Age of Onset  . Diabetes Mother  Social History:  He is single, has two adult daughters, currently unemployed, he has been staying with a brother History  Alcohol Use  . 4.8 oz/week  . 8 Cans of beer per week    Comment: drinks daily-drinks whatever is available     History  Drug Use No    Social History   Social History  . Marital Status: Single    Spouse Name: N/A  . Number of Children: N/A  . Years of Education: N/A   Social History Main Topics  . Smoking status: Current  Every Day Smoker -- 1.00 packs/day for 40 years    Types: Cigarettes  . Smokeless tobacco: Never Used  . Alcohol Use: 4.8 oz/week    8 Cans of beer per week     Comment: drinks daily-drinks whatever is available  . Drug Use: No  . Sexual Activity: No   Other Topics Concern  . None   Social History Narrative   Additional Social History:   Allergies:  No Known Allergies  Labs:  Results for orders placed or performed during the hospital encounter of 03/12/15 (from the past 48 hour(s))  Lipase, blood     Status: Abnormal   Collection Time: 03/12/15 12:26 PM  Result Value Ref Range   Lipase 61 (H) 22 - 51 U/L  Comprehensive metabolic panel     Status: Abnormal   Collection Time: 03/12/15 12:26 PM  Result Value Ref Range   Sodium 136 135 - 145 mmol/L   Potassium 3.6 3.5 - 5.1 mmol/L   Chloride 102 101 - 111 mmol/L   CO2 27 22 - 32 mmol/L   Glucose, Bld 112 (H) 65 - 99 mg/dL   BUN 5 (L) 6 - 20 mg/dL   Creatinine, Ser 0.44 (L) 0.61 - 1.24 mg/dL   Calcium 7.9 (L) 8.9 - 10.3 mg/dL   Total Protein 6.3 (L) 6.5 - 8.1 g/dL   Albumin 2.8 (L) 3.5 - 5.0 g/dL   AST 200 (H) 15 - 41 U/L   ALT 60 17 - 63 U/L   Alkaline Phosphatase 175 (H) 38 - 126 U/L   Total Bilirubin 2.3 (H) 0.3 - 1.2 mg/dL   GFR calc non Af Amer >60 >60 mL/min   GFR calc Af Amer >60 >60 mL/min    Comment: (NOTE) The eGFR has been calculated using the CKD EPI equation. This calculation has not been validated in all clinical situations. eGFR's persistently <60 mL/min signify possible Chronic Kidney Disease.    Anion gap 7 5 - 15  CBC     Status: Abnormal   Collection Time: 03/12/15 12:26 PM  Result Value Ref Range   WBC 9.4 4.0 - 10.5 K/uL   RBC 4.14 (L) 4.22 - 5.81 MIL/uL   Hemoglobin 14.6 13.0 - 17.0 g/dL   HCT 42.7 39.0 - 52.0 %   MCV 103.1 (H) 78.0 - 100.0 fL   MCH 35.3 (H) 26.0 - 34.0 pg   MCHC 34.2 30.0 - 36.0 g/dL   RDW 13.3 11.5 - 15.5 %   Platelets 120 (L) 150 - 400 K/uL  Ethanol     Status:  Abnormal   Collection Time: 03/12/15 12:36 PM  Result Value Ref Range   Alcohol, Ethyl (B) 322 (HH) <5 mg/dL    Comment:        LOWEST DETECTABLE LIMIT FOR SERUM ALCOHOL IS 5 mg/dL FOR MEDICAL PURPOSES ONLY CRITICAL RESULT CALLED TO, READ BACK BY AND VERIFIED WITH: TAUP,M @ 1351  ON 101416 BY POTEAT,S   Protime-INR     Status: Abnormal   Collection Time: 03/12/15  5:10 PM  Result Value Ref Range   Prothrombin Time 15.9 (H) 11.6 - 15.2 seconds   INR 1.25 0.00 - 1.49  Vitamin B12     Status: Abnormal   Collection Time: 03/13/15  5:58 AM  Result Value Ref Range   Vitamin B-12 1422 (H) 180 - 914 pg/mL    Comment: (NOTE) This assay is not validated for testing neonatal or myeloproliferative syndrome specimens for Vitamin B12 levels. Performed at [facility]   Comprehensive metabolic panel     Status: Abnormal   Collection Time: 03/13/15  5:58 AM  Result Value Ref Range   Sodium 137 135 - 145 mmol/L   Potassium 3.6 3.5 - 5.1 mmol/L   Chloride 100 (L) 101 - 111 mmol/L   CO2 27 22 - 32 mmol/L   Glucose, Bld 92 65 - 99 mg/dL   BUN 5 (L) 6 - 20 mg/dL   Creatinine, Ser 0.45 (L) 0.61 - 1.24 mg/dL   Calcium 8.2 (L) 8.9 - 10.3 mg/dL   Total Protein 6.0 (L) 6.5 - 8.1 g/dL   Albumin 2.5 (L) 3.5 - 5.0 g/dL   AST 164 (H) 15 - 41 U/L   ALT 52 17 - 63 U/L   Alkaline Phosphatase 159 (H) 38 - 126 U/L   Total Bilirubin 3.1 (H) 0.3 - 1.2 mg/dL   GFR calc non Af Amer >60 >60 mL/min   GFR calc Af Amer >60 >60 mL/min    Comment: (NOTE) The eGFR has been calculated using the CKD EPI equation. This calculation has not been validated in all clinical situations. eGFR's persistently <60 mL/min signify possible Chronic Kidney Disease.    Anion gap 10 5 - 15  CBC     Status: Abnormal   Collection Time: 03/13/15  5:58 AM  Result Value Ref Range   WBC 6.5 4.0 - 10.5 K/uL   RBC 3.83 (L) 4.22 - 5.81 MIL/uL   Hemoglobin 13.5 13.0 - 17.0 g/dL   HCT 39.0 39.0 - 52.0 %   MCV 101.8 (H) 78.0 -  100.0 fL   MCH 35.2 (H) 26.0 - 34.0 pg   MCHC 34.6 30.0 - 36.0 g/dL   RDW 13.1 11.5 - 15.5 %   Platelets 90 (L) 150 - 400 K/uL    Comment: REPEATED TO VERIFY SPECIMEN CHECKED FOR CLOTS PLATELET COUNT CONFIRMED BY SMEAR   Body fluid cell count with differential     Status: None   Collection Time: 03/13/15  9:24 AM  Result Value Ref Range   Fluid Type-FCT PERITONEAL     Comment: CORRECTED ON 10/15 AT 3559: PREVIOUSLY REPORTED AS Peritoneal   Color, Fluid YELLOW YELLOW   Appearance, Fluid CLEAR CLEAR   WBC, Fluid 98 0 - 1000 cu mm   Lymphs, Fluid 45 %   Monocyte-Macrophage-Serous Fluid 55 50 - 90 %   Other Cells, Fluid CORRELATE WITH CYTOLOGY. %    Current Facility-Administered Medications  Medication Dose Route Frequency Provider Last Rate Last Dose  . cefTRIAXone (ROCEPHIN) 2 g in dextrose 5 % 50 mL IVPB  2 g Intravenous Q24H Gareth Morgan, MD   Stopped at 03/12/15 1717  . folic acid (FOLVITE) tablet 1 mg  1 mg Oral Daily Donne Hazel, MD   1 mg at 03/13/15 1218  . LORazepam (ATIVAN) tablet 1 mg  1 mg Oral Q6H PRN Annie Main  Dierdre Highman, MD   1 mg at 03/13/15 0510   Or  . LORazepam (ATIVAN) injection 1 mg  1 mg Intravenous Q6H PRN Donne Hazel, MD   1 mg at 03/13/15 1220  . LORazepam (ATIVAN) injection 2 mg  2 mg Intravenous PRN Donne Hazel, MD   2 mg at 03/13/15 2637  . morphine 2 MG/ML injection 2 mg  2 mg Intravenous Q4H PRN Donne Hazel, MD      . multivitamin with minerals tablet 1 tablet  1 tablet Oral Daily Donne Hazel, MD   1 tablet at 03/13/15 1218  . ondansetron (ZOFRAN) tablet 4 mg  4 mg Oral Q6H PRN Donne Hazel, MD       Or  . ondansetron [facility]) injection 4 mg  4 mg Intravenous Q6H PRN Donne Hazel, MD      . sodium chloride 0.9 % injection 3 mL  3 mL Intravenous Q12H Donne Hazel, MD   3 mL at 03/13/15 1219  . thiamine (VITAMIN B-1) tablet 100 mg  100 mg Oral Daily Donne Hazel, MD   100 mg at 03/13/15 1218   Or  . thiamine (B-1) injection 100 mg   100 mg Intravenous Daily Donne Hazel, MD   100 mg at 03/12/15 1643    Musculoskeletal: Strength & Muscle Tone: within normal limits Gait & Station: gait not examined- patient states he has a lot of difficulty walking at this time Patient leans: N/A  Psychiatric Specialty Exam: ROS , denies visual disturbances,  denies chest pain, denies shortness of breath at room air, recent abdominal pain , (+) loose stools endorsed , difficulty ambulating, (+) peripheral " tingling "   Blood pressure 133/92, pulse 109, temperature 98 F (36.7 C), temperature source Oral, resp. rate 20, height 6' 1" (1.854 m), weight 180 lb (81.647 kg), SpO2 93 %.Body mass index is 23.75 kg/(m^2).  General Appearance: Fairly Groomed- appears chronically ill   Eye Contact::  Good  Speech:  Slow  Volume:  Decreased  Mood:  Depressed  Affect:  Constricted  Thought Process:  Linear  Orientation:  Other:  alert, attentive, oriented to hospital, to month, to year  Thought Content:  denies hallucinations, no delusions expressed   Suicidal Thoughts:  No today denies any plan or intention of hurting self and contracts for safety at this time  Homicidal Thoughts:  No  Memory:  recent and remote fair   Judgement:  Fair  Insight:  Fair  Psychomotor Activity:  slightly restless, distal tremors - which are mild at this time  Concentration:  Fair  Recall:  [employer] of Knowledge:Good  Language: Fair  Akathisia:  Negative  Handed:  Right  AIMS (if indicated):     Assets:  Desire for Improvement Resilience  ADL's:  Impaired  Cognition: WNL  Sleep:      Treatment Plan Summary: As below  Disposition: Recommendations 1. Agree with Ativan detox . Would  consider Standing Ativan taper as per CIWA protocol to minimize risk of worsening WDL .  2. Would continue PRN Ativan as needed for  Residual / ongoing WDL symptoms if needed in addition to standing .  3. Consider starting Zoloft 25 mgrs QDAY initially for depression.  If no side effects , increase to 50 mgrs QDAY after two days. 4. Consider starting Neurontin at 100 mgrs BID initially to address likely peripheral neuropathy symptoms 4. Consider PT consult to evaluate Gait impairment. 5.  As per his current presentation, patient may have continued  difficulty functioning independently/ mobilizing independently , although gait would be expected to improve gradually with sobriety. Would consider  ALF/ SNF placement  As disposition options. If ambulation improves and is able to function independently, would then consider referring to Residential Rehab setting .  6. Would obtain Mg++ serum level and correct if low, as hypomagnesemia likely to increase risk of WDL symptoms, seizure . 7. Psychiatry consultant will follow with you.  Neita Garnet 03/13/2015 12:36 PM

## 2015-03-13 NOTE — Progress Notes (Signed)
[facility] PROGRESS NOTE  [name] ZOX:[id number] DOB: [birth year] DOA: 03/12/2015 PCP: No primary care provider on file.  HPI/Brief narrative 54yo with hx of With a hx of alcohol abuse and depression well known to the behavioral health service who presented to the ED with complaints of one week of abd pain and distension. In the ED, serum ETOH was noted to be 322. LFT's were elevated, with AST over two times greater than ALT and with elevated bilirubin. Abd CT was notable for gallstones and for cirrhotic changes with ascites. Given complaint of abd pain, concerns for possible SBP. Pt was started on empiric rocephin and hospitalist consulted for consideration for admission.  Assessment/Plan: 1. Alcoholic hepatitis with ascites 1. Presenting AST/ALT is just over 2:1 with concurrent elevated bili 2. Admit ETOH level of 322 on admit 3. Would avoid hepatotoxic agents 2. Alcoholic cirrhosis 1. Liver with cirrhotic features on CT and ascites 3. Bowel/bladder incontinence 1. MRI lumbar spine negative for cord disease 2. B12 over 1400 4. Ascites, with initial concerns for SBP 1. Pt with presenting abd pain with moderate ascites on CT 2. Empiric rocephin was started 3. Remains afebrile with no leukocytosis 4. US guided paracentesis yielding 2.5L of ascites. Fluid analysis with <250 pmn's thus not suggestive of SBP. Cultures pending. If cultures neg, then would d/c abx 5. Thrombocytopenia 1. Likely secondary to liver disease per above 2. No signs of active bleeding at this time 3. Follow CBC 6. ETOH abuse 1. Alcohol level of 322 on presentation 2. Will continue on CIWA 3. Pt admits to drinking 5-6 "40's" per day. Last intake was on AM of admission 4. Had consulted psychiatry, appreciate input. Recs for scheduled ativan taper in addition to CIWA 7. DVT prophylaxis 1. SCD's 8. Depression 1. Near tearful during encounter in ED 2. Pt states he "just want to die in my  sleep" 3. Psychiatry input noted per above. For now, would hold off on antidepressants given mildly prolonged QTc of 470's. Consider starting if/when QTc improves 9. Prolonged QTc 1. QTc of 470's 2. Mg of 1.5, thus will correct 3. Would repeat ekg after correcting lytes to ensure correction of QTc 10. Hypomagnesemia 1. Mg of 1.5, likely secondary to chronic etoh abuse 2. Will correct  Code Status: Full Family Communication: Pt in room Disposition Plan: Pending   Consultants:  Psychiatry  Procedures: US guided paracentesis yielding 2.5L ascites - 10/15  Antibiotics: Anti-infectives    Start     Dose/Rate Route Frequency Ordered Stop   03/12/15 1515  cefTRIAXone (ROCEPHIN) 2 g in dextrose 5 % 50 mL IVPB     2 g 100 mL/hr over 30 Minutes Intravenous Every 24 hours 03/12/15 1510        HPI/Subjective: States feels better after paracentesis this AM  Objective: Filed Vitals:   03/13/15 0904 03/13/15 0910 03/13/15 0920 03/13/15 1500  BP: 136/87 133/92 133/92 120/80  Pulse:    111  Temp:    97.6 F (36.4 C)  TempSrc:    Oral  Resp:      Height:      Weight:      SpO2:    100%    Intake/Output Summary (Last 24 hours) at 03/13/15 1635 Last data filed at 03/13/15 1517  Gross per 24 hour  Intake      0 ml  Output    316 ml  Net   -316 ml   Filed Weights   03/12/15 1140  Weight: 81.647 kg (  180 lb)    Exam:   General:  Awake, in nad  Cardiovascular: regular, s1, s2  Respiratory: normal resp effor,t no wheezing  Abdomen: soft,pos BS, mildly tender  Musculoskeletal: perfused, no clubbing   Data Reviewed: Basic Metabolic Panel:  Recent Labs Lab 03/12/15 1226 03/13/15 0558  NA 136 137  K 3.6 3.6  CL 102 100*  CO2 27 27  GLUCOSE 112* 92  BUN 5* 5*  CREATININE 0.44* 0.45*  CALCIUM 7.9* 8.2*  MG  --  1.6*   Liver Function Tests:  Recent Labs Lab 03/12/15 1226 03/13/15 0558  AST 200* 164*  ALT 60 52  ALKPHOS 175* 159*  BILITOT 2.3* 3.1*   PROT 6.3* 6.0*  ALBUMIN 2.8* 2.5*    Recent Labs Lab 03/12/15 1226  LIPASE 61*   No results for input(s): AMMONIA in the last 168 hours. CBC:  Recent Labs Lab 03/12/15 1226 03/13/15 0558  WBC 9.4 6.5  HGB 14.6 13.5  HCT 42.7 39.0  MCV 103.1* 101.8*  PLT 120* 90*   Cardiac Enzymes: No results for input(s): CKTOTAL, CKMB, CKMBINDEX, TROPONINI in the last 168 hours. BNP (last 3 results) No results for input(s): BNP in the last 8760 hours.  ProBNP (last 3 results) No results for input(s): PROBNP in the last 8760 hours.  CBG: No results for input(s): GLUCAP in the last 168 hours.  No results found for this or any previous visit (from the past 240 hour(s)).   Studies: Ct Head Wo Contrast  03/12/2015  CLINICAL DATA:  Status post fall today.  Initial encounter. EXAM: CT HEAD WITHOUT CONTRAST TECHNIQUE: Contiguous axial images were obtained from the base of the skull through the vertex without intravenous contrast. COMPARISON:  None. FINDINGS: There is mild atrophy and chronic microvascular ischemic change. No evidence of acute abnormality including hemorrhage, infarct, mass lesion, mass effect, midline shift or abnormal extra-axial fluid collection is identified. No hydrocephalus or pneumocephalus. The calvarium is intact. Minimal mucosal thickening is seen in the maxillary sinuses and there is mild ethmoid air cell disease. The patient is status post maxillary antrostomy. Carotid atherosclerosis is noted. IMPRESSION: No acute abnormality. Mild atrophy and chronic microvascular ischemic change. Atherosclerosis. Mild sinus disease. Electronically Signed   By: Thomas  Dalessio M.D.   On: 03/12/2015 16:57   Mr Lumbar Spine Wo Contrast  03/12/2015  CLINICAL DATA:  Bowel and bladder incontinence.  Cirrhosis EXAM: MRI LUMBAR SPINE WITHOUT CONTRAST TECHNIQUE: Multiplanar, multisequence MR imaging of the lumbar spine was performed. No intravenous contrast was administered. COMPARISON:  CT  abdomen pelvis 03/12/2015 FINDINGS: The patient was not able to complete the study. Sagittal images were obtained which are of adequate quality. Axial images not obtained. Normal lumbar alignment. Negative for fracture or mass. No significant disc degeneration or disc protrusion. There is prominent epidural lipomatosis narrowing the canal at L5 and S1. Conus medullaris normal. Markedly enlarged urinary bladder. Recommend Foley catheter. Ascites also present. IMPRESSION: No significant disc protrusion or spinal stenosis. 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 Conus medullaris normal Markedly distended urinary bladder.  Ascites. Electronically Signed   By: Charles  Clark M.D.   On: 03/12/2015 20:29   Ct Abdomen Pelvis W Contrast  03/12/2015  CLINICAL DATA:  Abdominal pain and distention for 1 week. Left lower quadrant pain evaluate for diverticulitis EXAM: CT ABDOMEN AND PELVIS WITH CONTRAST TECHNIQUE: Multidetector CT imaging of the abdomen and pelvis was performed using the standard protocol following bolus administration of intravenous contrast. CONTRAST:  [id number]  OMNIPAQUE IOHEXOL 300 MG/ML SOLN, 25mL OMNIPAQUE IOHEXOL 300 MG/ML SOLN COMPARISON:  None. FINDINGS: Lung bases are clear.  No effusions.  Heart is normal size. Heterogeneous appearance of the liver with nodular contours. Findings compatible with cirrhosis. There is associated moderate ascites in the abdomen and pelvis. There are gallstones within the gallbladder. Gallbladder is distended. No biliary ductal dilatation. Spleen, pancreas, adrenals are unremarkable. Small cysts in the kidneys bilaterally. No hydronephrosis. Aorta and iliac vessels are calcified, non aneurysmal. Urinary bladder is distended. Stomach and small bowel are decompressed and unremarkable. Large bowel decompressed, grossly unremarkable. Large bowel decompressed, grossly unremarkable. No adenopathy. No acute bony abnormality or focal bone lesion. IMPRESSION: Changes of cirrhosis with  associated moderate ascites. Layering gallstones within the gallbladder. Gallbladder is moderately distended. No evidence of diverticulosis or diverticulitis. Electronically Signed   By: Charlett Nose M.D.   On: 03/12/2015 14:44   US Paracentesis  03/13/2015  INDICATION: ascites EXAM: ULTRASOUND-GUIDED PARACENTESIS COMPARISON:  None. MEDICATIONS: 10 cc 1% lidocaine COMPLICATIONS: None immediate TECHNIQUE: Informed written consent was obtained from the patient after a discussion of the risks, benefits and alternatives to treatment. A timeout was performed prior to the initiation of the procedure. Initial ultrasound scanning demonstrates a large amount of ascites within the left lower abdominal quadrant. The left lower abdomen was prepped and draped in the usual sterile fashion. 1% lidocaine with epinephrine was used for local anesthesia. Under direct ultrasound guidance, a 19 gauge, 7-cm, Yueh catheter was introduced. An ultrasound image was saved for documentation purposed. The paracentesis was performed. The catheter was removed and a dressing was applied. The patient tolerated the procedure well without immediate post procedural complication. FINDINGS: A total of approximately 2.5 liters of yellow fluid was removed. Samples were sent to the laboratory as requested by the clinical team. IMPRESSION: Successful ultrasound-guided paracentesis yielding 2.5 liters of peritoneal fluid. Read by:  Robet Leu [facility] Electronically Signed   By: Jolaine Click M.D.   On: 03/13/2015 13:43    Scheduled Meds: . cefTRIAXone (ROCEPHIN)  IV  2 g Intravenous Q24H  . folic acid  1 mg Oral Daily  . LORazepam  1 mg Oral 6 times per day  . magnesium sulfate 1 - 4 g bolus IVPB  2 g Intravenous Once  . multivitamin with minerals  1 tablet Oral Daily  . sodium chloride  3 mL Intravenous Q12H  . thiamine  100 mg Oral Daily   Or  . thiamine  100 mg Intravenous Daily   Continuous Infusions:   Principal Problem:   Alcoholic  hepatitis with ascites Active Problems:   Ascites   Alcohol abuse   Alcohol intoxication (HCC)   Thrombocytopenia (HCC)   Macrocytosis without anemia   Hyperbilirubinemia    Ashantia Amaral K  [facility] Pager [phone]. If 7PM-7AM, please contact night-coverage at [email or web address], password [facility] 03/13/2015, 4:35 PM

## 2015-03-13 NOTE — Evaluation (Signed)
Physical Therapy Evaluation Patient Details Name: [name] MRN: [id number] DOB: 1960/11/16 Today's Date: 03/13/2015   History of Present Illness  Pt is a 54 year old male with hx of alcohol abuse, tobacco abuse and depression well known to the behavioral health service who presented to the ED with complaints of one week of abd pain and distension and admitted for alcoholic hepatitis and ascities.  Clinical Impression  Pt admitted with above diagnosis. Pt currently with functional limitations due to the deficits listed below (see PT Problem List).  Pt will benefit from skilled PT to increase their independence and safety with mobility to allow discharge to the venue listed below.  Pt presents with ataxic gait and had a few instances of LEs buckling requiring at least mod assist to prevent fall.  Recommend 24/7 assist upon d/c, SNF if possible.  Pt is HIGH fall risk.  If d/c'ed to setting which is not SNF would recommend w/c to assist with safely mobilizing.     Follow Up Recommendations SNF;Supervision/Assistance - 24 hour    Equipment Recommendations  Wheelchair (measurements PT)    Recommendations for Other Services       Precautions / Restrictions Precautions Precautions: Fall      Mobility  Bed Mobility               General bed mobility comments: pt up in recliner on arrival  Transfers Overall transfer level: Needs assistance Equipment used: Rolling walker (2 wheeled) Transfers: Sit to/from Stand Sit to Stand: Min assist         General transfer comment: verbal cues for hand placement and safe technique  Ambulation/Gait Ambulation/Gait assistance: Mod assist Ambulation Distance (Feet): 100 Feet Assistive device: Rolling walker (2 wheeled) Gait Pattern/deviations: Step-through pattern;Trunk flexed;Ataxic;Narrow base of support     General Gait Details: very uncoordinated LEs, LE buckling 3x requiring mod assist to correct and prevent fall, at least min  assist for steadying and controlling RW, increased work of breathing upon return to [facility]    Modified Rankin (Stroke Patients Only)       Balance Overall balance assessment: Needs assistance;History of Falls         Standing balance support: Bilateral upper extremity supported Standing balance-Leahy Scale: Poor                               Pertinent Vitals/Pain Pain Assessment: No/denies pain    Home Living Family/patient expects to be discharged to:: Private residence Living Arrangements: Other relatives (brother)             Home Equipment: None Additional Comments: not very forthcoming to home environment, vaguely states brother not usually around    Prior Function Level of Independence: Independent               Hand Dominance        Extremity/Trunk Assessment               Lower Extremity Assessment: Generalized weakness;RLE deficits/detail;LLE deficits/detail RLE Deficits / Details: hip and knee grossly 3/5, unable to resist MMT, ankle WNL LLE Deficits / Details: hip and knee grossly 3/5, unable to resist MMT, ankle WNL     Communication   Communication: No difficulties  Cognition Arousal/Alertness: Awake/alert Behavior During Therapy: WFL for tasks assessed/performed Overall Cognitive Status: No family/caregiver present to determine baseline cognitive  functioning Area of Impairment: Safety/judgement         Safety/Judgement: Decreased awareness of safety;Decreased awareness of deficits          General Comments      Exercises        Assessment/Plan    PT Assessment Patient needs continued PT services  PT Diagnosis Difficulty walking;Generalized weakness   PT Problem List Decreased strength;Decreased activity tolerance;Decreased mobility;Decreased balance;Decreased knowledge of use of DME;Decreased coordination  PT Treatment Interventions DME instruction;Gait  training;Functional mobility training;Patient/family education;Therapeutic activities;Therapeutic exercise   PT Goals (Current goals can be found in the Care Plan section) Acute Rehab PT Goals PT Goal Formulation: With patient Time For Goal Achievement: 03/20/15 Potential to Achieve Goals: Fair    Frequency Min 3X/week   Barriers to discharge        Co-evaluation               End of Session Equipment Utilized During Treatment: Gait belt Activity Tolerance: Patient limited by fatigue Patient left: in chair;with call bell/phone within reach;with nursing/sitter in room      Functional Assessment Tool Used: clinical judgement Functional Limitation: Mobility: Walking and moving around Mobility: Walking and Moving Around Current Status [phone](G8978): At least 40 percent but less than 60 percent impaired, limited or restricted Mobility: Walking and Moving Around Goal Status [phone](G8979): At least 1 percent but less than 20 percent impaired, limited or restricted    Time: 1340-1350 PT Time Calculation (min) (ACUTE ONLY): 10 min   Charges:   PT Evaluation $Initial PT Evaluation Tier I: 1 Procedure     PT G Codes:   PT G-Codes **NOT FOR INPATIENT CLASS** Functional Assessment Tool Used: clinical judgement Functional Limitation: Mobility: Walking and moving around Mobility: Walking and Moving Around Current Status ([id number]): At least 40 percent but less than 60 percent impaired, limited or restricted Mobility: Walking and Moving Around Goal Status [phone](G8979): At least 1 percent but less than 20 percent impaired, limited or restricted    Jisel Fleet,KATHrine E 03/13/2015, 2:32 PM Zenovia JarredKati Helios Kohlmann, PT, DPT 03/13/2015 Pager: [phone]

## 2015-03-14 ENCOUNTER — Other Ambulatory Visit: Payer: Self-pay

## 2015-03-14 LAB — COMPREHENSIVE METABOLIC PANEL
ALK PHOS: 150 U/L — AB (ref 38–126)
ALT: 45 U/L (ref 17–63)
ANION GAP: 5 (ref 5–15)
AST: 129 U/L — ABNORMAL HIGH (ref 15–41)
Albumin: 2.4 g/dL — ABNORMAL LOW (ref 3.5–5.0)
BILIRUBIN TOTAL: 4.7 mg/dL — AB (ref 0.3–1.2)
BUN: 7 mg/dL (ref 6–20)
CALCIUM: 8.3 mg/dL — AB (ref 8.9–10.3)
CO2: 28 mmol/L (ref 22–32)
CREATININE: 0.36 mg/dL — AB (ref 0.61–1.24)
Chloride: 99 mmol/L — ABNORMAL LOW (ref 101–111)
Glucose, Bld: 118 mg/dL — ABNORMAL HIGH (ref 65–99)
Potassium: 3.3 mmol/L — ABNORMAL LOW (ref 3.5–5.1)
SODIUM: 132 mmol/L — AB (ref 135–145)
TOTAL PROTEIN: 5.9 g/dL — AB (ref 6.5–8.1)

## 2015-03-14 LAB — CBC
HEMATOCRIT: 38.2 % — AB (ref 39.0–52.0)
HEMOGLOBIN: 12.8 g/dL — AB (ref 13.0–17.0)
MCH: 34.9 pg — AB (ref 26.0–34.0)
MCHC: 33.5 g/dL (ref 30.0–36.0)
MCV: 104.1 fL — AB (ref 78.0–100.0)
Platelets: 78 10*3/uL — ABNORMAL LOW (ref 150–400)
RBC: 3.67 MIL/uL — ABNORMAL LOW (ref 4.22–5.81)
RDW: 13.1 % (ref 11.5–15.5)
WBC: 5.4 10*3/uL (ref 4.0–10.5)

## 2015-03-14 LAB — MAGNESIUM: MAGNESIUM: 1.7 mg/dL (ref 1.7–2.4)

## 2015-03-14 MED ORDER — POTASSIUM CHLORIDE CRYS ER 20 MEQ PO TBCR
40.0000 meq | EXTENDED_RELEASE_TABLET | Freq: Once | ORAL | Status: AC
  Administered 2015-03-14: 40 meq via ORAL
  Filled 2015-03-14: qty 2

## 2015-03-14 MED ORDER — MAGNESIUM SULFATE 2 GM/50ML IV SOLN
2.0000 g | Freq: Once | INTRAVENOUS | Status: AC
  Administered 2015-03-14: 2 g via INTRAVENOUS
  Filled 2015-03-14: qty 50

## 2015-03-14 NOTE — Progress Notes (Signed)
[facility] PROGRESS NOTE  [name] ZOX:[id number] DOB: 1961/05/21 DOA: 03/12/2015 PCP: No primary care provider on file.  HPI/Brief narrative 54yo with hx of With a hx of alcohol abuse and depression well known to the behavioral health service who presented to the ED with complaints of one week of abd pain and distension. In the ED, serum ETOH was noted to be 322. LFT's were elevated, with AST over two times greater than ALT and with elevated bilirubin. Abd CT was notable for gallstones and for cirrhotic changes with ascites. Given complaint of abd pain, concerns for possible SBP. Pt was started on empiric rocephin and hospitalist consulted for consideration for admission.  Assessment/Plan: 1. Alcoholic hepatitis with ascites 1. Presenting AST/ALT is just over 2:1 with concurrent elevated bili 2. Admit ETOH level of 322 on admit 3. Plan to avoid hepatotoxic agents 2. Alcoholic cirrhosis 1. Liver with cirrhotic features on CT and ascites 3. Bowel/bladder incontinence 1. MRI lumbar spine negative for cord disease 2. B12 over 1400 4. Ascites, with initial concerns for SBP 1. Pt with presenting abd pain with moderate ascites on CT 2. Empiric rocephin was started on admit 3. Remains afebrile with no leukocytosis 4. US guided paracentesis yielding 2.5L of ascites. Fluid analysis with <250 pmn's thus not suggestive of SBP. 5. SAAG <1.1 6. Awaiting finalization of fluid cultures. If NEG, then would stop abx 5. Thrombocytopenia 1. Likely secondary to liver disease per above 2. No signs of active bleeding at this time 3. Trending down. 4. Cont to monitor 6. ETOH abuse 1. Alcohol level of 322 on presentation 2. Will continue on CIWA 3. Pt admits to drinking 5-6 "40's" per day (about 7L of alcohol daily). Last intake was on AM of admission 4. Had consulted psychiatry, appreciate input. Recs for scheduled ativan taper in addition to CIWA 5. Patient appears stable from withdrawal  standpoint currently 7. DVT prophylaxis 1. SCD's 8. Depression 1. Near tearful during encounter in ED on admit and stated "I just want to die in my sleep" 2. Psychiatry was consulted and recs noted. For now, would hold off on antidepressants given prolonged QTc of over 500 9. Prolonged QTc 1. QTc of 470's on admit 2. Repeat QTc of 500 3. Mg still 1.7 4. Cont to correct lytes 10. Hypomagnesemia 1. Mg of 1.7se 2. Will correct to keep >2.0  Code Status: Full Family Communication: Pt in room Disposition Plan: Pending   Consultants:  Psychiatry  IR  Procedures: US guided paracentesis yielding 2.5L ascites - 10/15  Antibiotics: Anti-infectives    Start     Dose/Rate Route Frequency Ordered Stop   03/12/15 1515  cefTRIAXone (ROCEPHIN) 2 g in dextrose 5 % 50 mL IVPB     2 g 100 mL/hr over 30 Minutes Intravenous Every 24 hours 03/12/15 1510        HPI/Subjective: Still feels weak. No other complaints  Objective: Filed Vitals:   03/13/15 1500 03/13/15 2134 03/14/15 0001 03/14/15 0525  BP: 120/80 104/64 121/85 116/78  Pulse: 111 98 97 106  Temp: 97.6 F (36.4 C) 97.9 F (36.6 C) 97.8 F (36.6 C) 98.2 F (36.8 C)  TempSrc: Oral Oral Oral Oral  Resp:  Height:      Weight:      SpO2: 100% 98% 95% 96%    Intake/Output Summary (Last 24 hours) at 03/14/15 1319 Last data filed at 10[name]35  Gross per 24 hour  Intake    720 ml  Output     14 ml  Net    706 ml   Filed Weights   03/12/15 1140  Weight: 81.647 kg (180 lb)    Exam:   General:  Awake, ambulating with assistance to bathroom, in nad  Cardiovascular: regular, s1, s2  Respiratory: normal resp effor,t no wheezing  Abdomen: soft,pos BS, mildly tender  Musculoskeletal: perfused, no clubbing   Data Reviewed: Basic Metabolic Panel:  Recent Labs Lab 03/12/15 1226 03/13/15 0558 03/14/15 0529  NA 136 137 132*  K 3.6 3.6 3.3*  CL 102 100* 99*  CO2 27 27 28   GLUCOSE 112* 92 118*   BUN 5* 5* 7  CREATININE 0.44* 0.45* 0.36*  CALCIUM 7.9* 8.2* 8.3*  MG  --  1.6* 1.7   Liver Function Tests:  Recent Labs Lab 03/12/15 1226 03/13/15 0558 03/14/15 0529  AST 200* 164* 129*  ALT 60 52 45  ALKPHOS 175* 159* 150*  BILITOT 2.3* 3.1* 4.7*  PROT 6.3* 6.0* 5.9*  ALBUMIN 2.8* 2.5* 2.4*    Recent Labs Lab 03/12/15 1226  LIPASE 61*   No results for input(s): AMMONIA in the last 168 hours. CBC:  Recent Labs Lab 03/12/15 1226 03/13/15 0558 03/14/15 0529  WBC 9.4 6.5 5.4  HGB 14.6 13.5 12.8*  HCT 42.7 39.0 38.2*  MCV 103.1* 101.8* 104.1*  PLT 120* 90* 78*   Cardiac Enzymes: No results for input(s): CKTOTAL, CKMB, CKMBINDEX, TROPONINI in the last 168 hours. BNP (last 3 results) No results for input(s): BNP in the last 8760 hours.  ProBNP (last 3 results) No results for input(s): PROBNP in the last 8760 hours.  CBG: No results for input(s): GLUCAP in the last 168 hours.  Recent Results (from the past 240 hour(s))  Urine culture     Status: None   Collection Time: 03/12/15  5:39 PM  Result Value Ref Range Status   Specimen Description URINE, CLEAN CATCH  Final   Special Requests NONE  Final   Culture   Final    NO GROWTH 1 DAY Performed at [facility]    Report Status 03/13/2015 FINAL  Final  Gram stain     Status: None   Collection Time: 03/13/15  9:25 AM  Result Value Ref Range Status   Specimen Description FLUID  Final   Special Requests NONE  Final   Gram Stain   Final    WBC PRESENT,BOTH PMN AND MONONUCLEAR NO ORGANISMS SEEN CONFIRMED BY K.WOOTEN Performed at [facility]    Report Status 03/13/2015 FINAL  Final     Studies: Ct Head Wo Contrast  03/12/2015  CLINICAL DATA:  Status post fall today.  Initial encounter. EXAM: CT HEAD WITHOUT CONTRAST TECHNIQUE: Contiguous axial images were obtained from the base of the skull through the vertex without intravenous contrast. COMPARISON:  None. FINDINGS: There is mild atrophy  and chronic microvascular ischemic change. No evidence of acute abnormality including hemorrhage, infarct, mass lesion, mass effect, midline shift or abnormal extra-axial fluid collection is identified. No hydrocephalus or pneumocephalus. The calvarium is intact. Minimal mucosal thickening is seen in the maxillary sinuses and there is mild ethmoid air cell disease. The patient is status post maxillary antrostomy. Carotid atherosclerosis is noted. IMPRESSION: No acute abnormality. Mild atrophy and chronic microvascular ischemic change. Atherosclerosis. Mild sinus disease. Electronically Signed   By: Drusilla Kanner M.D.   On: 03/12/2015 16:57   Mr Lumbar Spine Wo Contrast  03/12/2015  CLINICAL DATA:  Bowel and  bladder incontinence.  Cirrhosis EXAM: MRI LUMBAR SPINE WITHOUT CONTRAST TECHNIQUE: Multiplanar, multisequence MR imaging of the lumbar spine was performed. No intravenous contrast was administered. COMPARISON:  CT abdomen pelvis 03/12/2015 FINDINGS: The patient was not able to complete the study. Sagittal images were obtained which are of adequate quality. Axial images not obtained. Normal lumbar alignment. Negative for fracture or mass. No significant disc degeneration or disc protrusion. There is prominent epidural lipomatosis narrowing the canal at L5 and S1. Conus medullaris normal. Markedly enlarged urinary bladder. Recommend Foley catheter. Ascites also present. IMPRESSION: No significant disc protrusion or spinal stenosis. 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 Conus medullaris normal Markedly distended urinary bladder.  Ascites. Electronically Signed   By: Marlan Palau M.D.   On: 03/12/2015 20:29   Ct Abdomen Pelvis W Contrast  03/12/2015  CLINICAL DATA:  Abdominal pain and distention for 1 week. Left lower quadrant pain evaluate for diverticulitis EXAM: CT ABDOMEN AND PELVIS WITH CONTRAST TECHNIQUE: Multidetector CT imaging of the abdomen and pelvis was performed using the standard protocol  following bolus administration of intravenous contrast. CONTRAST:  OMNIPAQUE IOHEXOL 300 MG/ML SOLN, 25mL OMNIPAQUE IOHEXOL 300 MG/ML SOLN COMPARISON:  None. FINDINGS: Lung bases are clear.  No effusions.  Heart is normal size. Heterogeneous appearance of the liver with nodular contours. Findings compatible with cirrhosis. There is associated moderate ascites in the abdomen and pelvis. There are gallstones within the gallbladder. Gallbladder is distended. No biliary ductal dilatation. Spleen, pancreas, adrenals are unremarkable. Small cysts in the kidneys bilaterally. No hydronephrosis. Aorta and iliac vessels are calcified, non aneurysmal. Urinary bladder is distended. Stomach and small bowel are decompressed and unremarkable. Large bowel decompressed, grossly unremarkable. Large bowel decompressed, grossly unremarkable. No adenopathy. No acute bony abnormality or focal bone lesion. IMPRESSION: Changes of cirrhosis with associated moderate ascites. Layering gallstones within the gallbladder. Gallbladder is moderately distended. No evidence of diverticulosis or diverticulitis. Electronically Signed   By: Charlett Nose M.D.   On: 03/12/2015 14:44   US Paracentesis  03/13/2015  INDICATION: ascites EXAM: ULTRASOUND-GUIDED PARACENTESIS COMPARISON:  None. MEDICATIONS: 10 cc 1% lidocaine COMPLICATIONS: None immediate TECHNIQUE: Informed written consent was obtained from the patient after a discussion of the risks, benefits and alternatives to treatment. A timeout was performed prior to the initiation of the procedure. Initial ultrasound scanning demonstrates a large amount of ascites within the left lower abdominal quadrant. The left lower abdomen was prepped and draped in the usual sterile fashion. 1% lidocaine with epinephrine was used for local anesthesia. Under direct ultrasound guidance, a 19 gauge, 7-cm, Yueh catheter was introduced. An ultrasound image was saved for documentation purposed. The paracentesis  was performed. The catheter was removed and a dressing was applied. The patient tolerated the procedure well without immediate post procedural complication. FINDINGS: A total of approximately 2.5 liters of yellow fluid was removed. Samples were sent to the laboratory as requested by the clinical team. IMPRESSION: Successful ultrasound-guided paracentesis yielding 2.5 liters of peritoneal fluid. Read by:  Robet Leu [facility] Electronically Signed   By: Jolaine Click M.D.   On: 03/13/2015 13:43    Scheduled Meds: . cefTRIAXone (ROCEPHIN)  IV  2 g Intravenous Q24H  . folic acid  1 mg Oral Daily  . LORazepam  1 mg Oral 6 times per day  . multivitamin with minerals  1 tablet Oral Daily  . sodium chloride  3 mL Intravenous Q12H  . thiamine  100 mg Oral Daily   Or  .  thiamine  100 mg Intravenous Daily   Continuous Infusions:   Principal Problem:   Alcoholic hepatitis with ascites Active Problems:   Ascites   Alcohol abuse   Alcohol intoxication (HCC)   Thrombocytopenia (HCC)   Macrocytosis without anemia   Hyperbilirubinemia    [place] K  [facility] Pager [phone]. If 7PM-7AM, please contact night-coverage at [email or web address], password [email or web address] 03/14/2015, 1:19 PM  LOS: 1 day

## 2015-03-15 LAB — COMPREHENSIVE METABOLIC PANEL
ALK PHOS: 145 U/L — AB (ref 38–126)
ALT: 42 U/L (ref 17–63)
ANION GAP: 7 (ref 5–15)
AST: 95 U/L — ABNORMAL HIGH (ref 15–41)
Albumin: 2.4 g/dL — ABNORMAL LOW (ref 3.5–5.0)
BUN: 5 mg/dL — ABNORMAL LOW (ref 6–20)
CALCIUM: 8.3 mg/dL — AB (ref 8.9–10.3)
CO2: 28 mmol/L (ref 22–32)
Chloride: 98 mmol/L — ABNORMAL LOW (ref 101–111)
Creatinine, Ser: 0.55 mg/dL — ABNORMAL LOW (ref 0.61–1.24)
GFR calc non Af Amer: >60 mL/min (ref >60)
Glucose, Bld: 108 mg/dL — ABNORMAL HIGH (ref 65–99)
Potassium: 3.8 mmol/L (ref 3.5–5.1)
SODIUM: 133 mmol/L — AB (ref 135–145)
Total Bilirubin: 2.6 mg/dL — ABNORMAL HIGH (ref 0.3–1.2)
Total Protein: 5.9 g/dL — ABNORMAL LOW (ref 6.5–8.1)

## 2015-03-15 LAB — FOLATE RBC
FOLATE, HEMOLYSATE: 351.9 ng/mL
Folate, RBC: 891 ng/mL (ref >498)
HEMATOCRIT: 39.5 % (ref 37.5–51.0)

## 2015-03-15 LAB — MAGNESIUM: Magnesium: 1.8 mg/dL (ref 1.7–2.4)

## 2015-03-15 MED ORDER — FUROSEMIDE 20 MG PO TABS
20.0000 mg | ORAL_TABLET | Freq: Every day | ORAL | Status: DC
  Administered 2015-03-15: 20 mg via ORAL
  Filled 2015-03-15: qty 1

## 2015-03-15 MED ORDER — FUROSEMIDE 20 MG PO TABS: 20.0000 mg | ORAL_TABLET | Freq: Every day | ORAL | Status: DC

## 2015-03-15 MED ORDER — POTASSIUM CHLORIDE CRYS ER 20 MEQ PO TBCR
40.0000 meq | EXTENDED_RELEASE_TABLET | Freq: Once | ORAL | Status: AC
  Administered 2015-03-15: 40 meq via ORAL
  Filled 2015-03-15: qty 2

## 2015-03-15 MED ORDER — MAGNESIUM SULFATE 2 GM/50ML IV SOLN
2.0000 g | Freq: Once | INTRAVENOUS | Status: AC
  Administered 2015-03-15: 2 g via INTRAVENOUS
  Filled 2015-03-15: qty 50

## 2015-03-15 MED ORDER — LORAZEPAM 1 MG PO TABS: 1.0000 mg | ORAL_TABLET | Freq: Two times a day (BID) | ORAL | Status: DC

## 2015-03-15 NOTE — Care Management Note (Signed)
Case Management Note  Patient Details  Name: Libby MawRobert Moffatt MRN: [id number] Date of Birth: Jan 10, 1961  Subjective/Objective:    54 yo admitted with Alcoholic hepatitis                Action/Plan: From motel with brother and plans to return there.  Expected Discharge Date:   (unknown)               Expected Discharge Plan:  Home/Self Care  In-House Referral:  Clinical Social Work  Discharge planning Services  CM Consult, [facility]igent [facility]  Post Acute Care Choice:    Choice offered to:     DME Arranged:    DME Agency:     HH Arranged:    HH Agency:     Status of Service:  In process, will continue to follow  Medicare Important Message Given:    Date Medicare IM Given:    Medicare IM give by:    Date Additional Medicare IM Given:    Additional Medicare Important Message give by:     If discussed at Long Length of Stay Meetings, dates discussed:    Additional Comments: Pt is declining SNF at this time and because he does not have insurance would not qualify for HHPT. [facility] PT is recommending RW and wheelchair. Pt states he does not want a wheelchair but [facility] will see if pt qualifies for a RW. [facility] and was informed he could get his [facility] there if he calls to get an appointment. He states he will do this.  Addendum:  Per [facility], pt does qualify for charity for a RW. [facility] DME rep to bring to pt room. No other CM needs communicated. Bartholome BillCLEMENTS, Blonnie Maske H, RN 03/15/2015, 3:53 PM

## 2015-03-15 NOTE — Discharge Summary (Addendum)
Physician Discharge Summary  Bryce MawRobert [name] ZOX:[id number] DOB: 05/04/1961 DOA: 03/12/2015  PCP: No primary care provider on file.  Admit date: 03/12/2015 Discharge date: 03/15/2015  Time spent: 20 minutes  Recommendations for Outpatient Follow-up:  1. Please follow up with PCP in 1-2 weeks 2. Follow up with Behavioral Health as scheduled  3. Please follow CBC and Comprehensive Metabolic Panel as outpatient, recommend repeat in 1-2 weeks  Discharge Diagnoses:  Principal Problem:   Alcoholic hepatitis with ascites Active Problems:   Ascites   Alcohol abuse   Alcohol intoxication (HCC)   Thrombocytopenia (HCC)   Macrocytosis without anemia   Hyperbilirubinemia   Discharge Condition: Improved  Diet recommendation: Low sodium  Filed Weights   03/12/15 1140  Weight: 81.647 kg (180 lb)    History of present illness:  Please review dictated H and P from  10/14 for details. Briefly, 54yo with hx of With a hx of alcohol abuse and depression well known to the behavioral health service who presented to the ED with complaints of one week of abd pain and distension. In the ED, serum ETOH was noted to be 322. LFT's were elevated, with AST over two times greater than ALT and with elevated bilirubin. Abd CT was notable for gallstones and for cirrhotic changes with ascites. Given complaint of abd pain, concerns for possible SBP. Pt was started on empiric rocephin and hospitalist consulted for consideration for admission.  Hospital Course:  1. Alcoholic hepatitis with ascites 1. Presenting AST/ALT is just over 2:1 with concurrent elevated bili 2. Admit ETOH level of 322 on admit 3. Would avoid hepatotoxic agents 2. Alcoholic cirrhosis 1. Liver with cirrhotic features with ascites on CT 3. Bowel/bladder incontinence 1. MRI lumbar spine negative for cord disease 2. B12 over 1400 4. Ascites likely secondary to cirrhosis, with initial concerns for SBP but since ruled out 1. Pt with  presenting abd pain with moderate ascites on CT 2. Empiric rocephin was started on admit to cover SBP empirically 3. Remained afebrile with no leukocytosis 4. US guided paracentesis yielding 2.5L of ascites. Fluid analysis with <250 pmn's thus not suggestive of SBP. 5. SAAG <1.1 6. Fluid cultures NEG thus far, thus have stopped abx 5. Thrombocytopenia 1. Likely secondary to liver disease per above 2. No signs of active bleeding at this time 3. Had been trending down. 4. Cont to monitor and follow as outpatient 6. ETOH abuse 1. Alcohol level of 322 on presentation 2. Continued on CIWA 3. Pt admits to drinking 5-6 "40's" per day (about 7L of alcohol daily). Last intake was on AM of admission 4. Had consulted psychiatry, appreciate input. Recs for scheduled ativan taper in addition to CIWA 5. Patient appears stable from withdrawal standpoint with CIWA of zero at last check 7. DVT prophylaxis 1. SCD's 8. Depression 1. Near tearful during encounter in ED on admit and stated "I just want to die in my sleep" 2. Psychiatry was consulted and recs noted. For now, would hold off on antidepressants given prolonged QTc of over 500 9. Prolonged QTc 1. QTc of 470's on admit 2. Repeat QTc of 500 3. Mg was corrected 4. Would recommend following lytes closely as outpatient 10. Hypomagnesemia 1. Corrected  11. LE weakness 1. Seen by PT/OT with recs for home health PT/OT 2. Patient reports weakness has been present for years and thus may be patient's norm  Procedures: US guided paracentesis yielding 2.5L ascites - 10/15  Consultations:  IR  Psychiatry  Discharge Exam: Ceasar MonsFiled  Vitals:   03/14/15 2157 03/15/15 0004 03/15/15 0535 03/15/15 1300  BP: 113/76 102/71 115/75 96/74  Pulse: 100 95 88 94  Temp: 98 F (36.7 C) 98 F (36.7 C) 97.7 F (36.5 C) 97.8 F (36.6 C)  TempSrc: Oral Oral Oral Oral  Resp: 20 18 18 18   Height:      Weight:      SpO2: 98% 95% 98% 100%    General: awake,  in nad Cardiovascular: regular, s1, s2 Respiratory: normal resp effort, no wheezing  Discharge Instructions     Medication List    STOP taking these medications        citalopram 20 MG tablet  Commonly known as:  CELEXA     lisinopril 10 MG tablet  Commonly known as:  PRINIVIL,ZESTRIL     traZODone 50 MG tablet  Commonly known as:  DESYREL      TAKE these medications        furosemide 20 MG tablet  Commonly known as:  LASIX  Take 1 tablet (20 mg total) by mouth daily.       No Known Allergies Follow-up Information    Follow up with Follow up with PCP in 1-2 weeks.   Why:  Hospital follow up      Follow up with [facility].   Specialty:  [facility] information:   [address] [place] [address] [phone]       Go to [facility].   Specialty:  [facility] information:   [address] [place] [address] [phone]       Follow up with [facility]. Schedule an appointment as soon as possible for a visit in 1 week.   Why:  also can go to this pharmacy for sample/cheaper meds when you make appointment.   Contact information:   [address] [place] [address] [phone]       The results of significant diagnostics from this hospitalization (including imaging, microbiology, ancillary and laboratory) are listed below for reference.    Significant Diagnostic Studies: Ct Head Wo Contrast  03/12/2015  CLINICAL DATA:  Status post fall today.  Initial encounter. EXAM: CT HEAD WITHOUT CONTRAST TECHNIQUE: Contiguous axial images were obtained from the base of the skull through the vertex without intravenous contrast. COMPARISON:  None. FINDINGS: There is mild atrophy and chronic microvascular ischemic change. No evidence of acute abnormality including hemorrhage, infarct, mass lesion, mass effect, midline shift or abnormal extra-axial fluid  collection is identified. No hydrocephalus or pneumocephalus. The calvarium is intact. Minimal mucosal thickening is seen in the maxillary sinuses and there is mild ethmoid air cell disease. The patient is status post maxillary antrostomy. Carotid atherosclerosis is noted. IMPRESSION: No acute abnormality. Mild atrophy and chronic microvascular ischemic change. Atherosclerosis. Mild sinus disease. Electronically Signed   By: Drusilla Kanner M.D.   On: 03/12/2015 16:57   Mr Lumbar Spine Wo Contrast  03/12/2015  CLINICAL DATA:  Bowel and bladder incontinence.  Cirrhosis EXAM: MRI LUMBAR SPINE WITHOUT CONTRAST TECHNIQUE: Multiplanar, multisequence MR imaging of the lumbar spine was performed. No intravenous contrast was administered. COMPARISON:  CT abdomen pelvis 03/12/2015 FINDINGS: The patient was not able to complete the study. Sagittal images were obtained which are of adequate quality. Axial images not obtained. Normal lumbar alignment. Negative for fracture or mass. No significant disc degeneration or disc protrusion. There is prominent epidural lipomatosis  narrowing the canal at L5 and S1. Conus medullaris normal. Markedly enlarged urinary bladder. Recommend Foley catheter. Ascites also present. IMPRESSION: No significant disc protrusion or spinal stenosis. 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 Conus medullaris normal Markedly distended urinary bladder.  Ascites. Electronically Signed   By: Marlan Palau M.D.   On: 03/12/2015 20:29   Ct Abdomen Pelvis W Contrast  03/12/2015  CLINICAL DATA:  Abdominal pain and distention for 1 week. Left lower quadrant pain evaluate for diverticulitis EXAM: CT ABDOMEN AND PELVIS WITH CONTRAST TECHNIQUE: Multidetector CT imaging of the abdomen and pelvis was performed using the standard protocol following bolus administration of intravenous contrast. CONTRAST:  OMNIPAQUE IOHEXOL 300 MG/ML SOLN, 25mL OMNIPAQUE IOHEXOL 300 MG/ML SOLN COMPARISON:  None. FINDINGS: Lung  bases are clear.  No effusions.  Heart is normal size. Heterogeneous appearance of the liver with nodular contours. Findings compatible with cirrhosis. There is associated moderate ascites in the abdomen and pelvis. There are gallstones within the gallbladder. Gallbladder is distended. No biliary ductal dilatation. Spleen, pancreas, adrenals are unremarkable. Small cysts in the kidneys bilaterally. No hydronephrosis. Aorta and iliac vessels are calcified, non aneurysmal. Urinary bladder is distended. Stomach and small bowel are decompressed and unremarkable. Large bowel decompressed, grossly unremarkable. Large bowel decompressed, grossly unremarkable. No adenopathy. No acute bony abnormality or focal bone lesion. IMPRESSION: Changes of cirrhosis with associated moderate ascites. Layering gallstones within the gallbladder. Gallbladder is moderately distended. No evidence of diverticulosis or diverticulitis. Electronically Signed   By: Charlett Nose M.D.   On: 03/12/2015 14:44   US Paracentesis  03/13/2015  INDICATION: ascites EXAM: ULTRASOUND-GUIDED PARACENTESIS COMPARISON:  None. MEDICATIONS: 10 cc 1% lidocaine COMPLICATIONS: None immediate TECHNIQUE: Informed written consent was obtained from the patient after a discussion of the risks, benefits and alternatives to treatment. A timeout was performed prior to the initiation of the procedure. Initial ultrasound scanning demonstrates a large amount of ascites within the left lower abdominal quadrant. The left lower abdomen was prepped and draped in the usual sterile fashion. 1% lidocaine with epinephrine was used for local anesthesia. Under direct ultrasound guidance, a 19 gauge, 7-cm, Yueh catheter was introduced. An ultrasound image was saved for documentation purposed. The paracentesis was performed. The catheter was removed and a dressing was applied. The patient tolerated the procedure well without immediate post procedural complication. FINDINGS: A total of  approximately 2.5 liters of yellow fluid was removed. Samples were sent to the laboratory as requested by the clinical team. IMPRESSION: Successful ultrasound-guided paracentesis yielding 2.5 liters of peritoneal fluid. Read by:  Robet Leu [facility] Electronically Signed   By: Jolaine Click M.D.   On: 03/13/2015 13:43    Microbiology: Recent Results (from the past 240 hour(s))  Urine culture     Status: None   Collection Time: 03/12/15  5:39 PM  Result Value Ref Range Status   Specimen Description URINE, CLEAN CATCH  Final   Special Requests NONE  Final   Culture   Final    NO GROWTH 1 DAY Performed at [facility]    Report Status 03/13/2015 FINAL  Final  Culture, body fluid-bottle     Status: None (Preliminary result)   Collection Time: 03/13/15  9:25 AM  Result Value Ref Range Status   Specimen Description FLUID  Final   Special Requests NONE  Final   Culture   Final    NO GROWTH 2 DAYS Performed at [facility]    Report Status PENDING  Incomplete  Gram stain     Status: None   Collection Time: 03/13/15  9:25 AM  Result Value Ref Range Status   Specimen Description FLUID  Final   Special Requests NONE  Final   Gram Stain   Final    WBC PRESENT,BOTH PMN AND MONONUCLEAR NO ORGANISMS SEEN CONFIRMED BY K.WOOTEN Performed at [facility]    Report Status 03/13/2015 FINAL  Final     Labs: Basic Metabolic Panel:  Recent Labs Lab 03/12/15 1226 03/13/15 0558 03/14/15 0529 03/15/15 0605  NA 136 137 132* 133*  K 3.6 3.6 3.3* 3.8  CL 102 100* 99* 98*  CO2 GLUCOSE 112* 92 118* 108*  BUN 5* 5* 7 5*  CREATININE 0.44* 0.45* 0.36* 0.55*  CALCIUM 7.9* 8.2* 8.3* 8.3*  MG  --  1.6* 1.7 1.8   Liver Function Tests:  Recent Labs Lab 03/12/15 1226 03/13/15 0558 03/14/15 0529 03/15/15 0605  AST 200* 164* 129* 95*  ALT 60 52 45 42  ALKPHOS 175* 159* 150* 145*  BILITOT 2.3* 3.1* 4.7* 2.6*  PROT 6.3* 6.0* 5.9* 5.9*  ALBUMIN 2.8* 2.5*  2.4* 2.4*    Recent Labs Lab 03/12/15 1226  LIPASE 61*   No results for input(s): AMMONIA in the last 168 hours. CBC:  Recent Labs Lab 03/12/15 1226 03/13/15 0558 03/14/15 0529  WBC 9.4 6.5 5.4  HGB 14.6 13.5 12.8*  HCT 42.7 39.0  39.5 38.2*  MCV 103.1* 101.8* 104.1*  PLT 120* 90* 78*   Cardiac Enzymes: No results for input(s): CKTOTAL, CKMB, CKMBINDEX, TROPONINI in the last 168 hours. BNP: BNP (last 3 results) No results for input(s): BNP in the last 8760 hours.  ProBNP (last 3 results) No results for input(s): PROBNP in the last 8760 hours.  CBG: No results for input(s): GLUCAP in the last 168 hours.  Signed:  Truc Winfree, Scheryl Marten  [facility] 03/15/2015, 4:18 PM

## 2015-03-15 NOTE — Progress Notes (Signed)
Physical Therapy Treatment Patient Details Name: [name] MRN: [id number] DOB: 03-Sep-1960 Today's Date: 03/15/2015    History of Present Illness Pt is a 54 year old male with hx of alcohol abuse, tobacco abuse and depression well known to the behavioral health service who presented to the ED with complaints of one week of abd pain and distension and admitted for alcoholic hepatitis and ascities.    PT Comments    Improved mobility today. Pt walked 250' with RW with LOB x 1 requiring mod assist.   Follow Up Recommendations  Home health PT;Supervision for mobility/OOB     Equipment Recommendations  Wheelchair (measurements PT);Rolling walker with 5" wheels    Recommendations for Other Services       Precautions / Restrictions Precautions Precautions: Fall Precaution Comments: pt reports no falls in past year, other than one fall in bathroom during this hospital admission Restrictions Weight Bearing Restrictions: No    Mobility  Bed Mobility               General bed mobility comments: pt up in recliner on arrival  Transfers Overall transfer level: Needs assistance Equipment used: Rolling walker (2 wheeled) Transfers: Sit to/from Stand Sit to Stand: Min guard         General transfer comment: verbal cues for hand placement and safe technique  Ambulation/Gait   Ambulation Distance (Feet): 250 Feet Assistive device: Rolling walker (2 wheeled) Gait Pattern/deviations: Step-through pattern   Gait velocity interpretation: Below normal speed for age/gender General Gait Details: LOB x 1 requiring mod assist, otherwise steady, no dyspnea   Stairs            Wheelchair Mobility    Modified Rankin (Stroke Patients Only)       Balance             Standing balance-Leahy Scale: Fair                      Cognition Arousal/Alertness: Awake/alert Behavior During Therapy: WFL for tasks assessed/performed Overall Cognitive Status: Within  Functional Limits for tasks assessed Area of Impairment: Safety/judgement         Safety/Judgement: Decreased awareness of safety;Decreased awareness of deficits          Exercises      General Comments        Pertinent Vitals/Pain Pain Assessment: No/denies pain    Home Living Family/patient expects to be discharged to:: Private residence Living Arrangements: Non-relatives/Friends Available Help at Discharge: Family Type of Home: Apartment Home Access: Level entry   Home Layout: One level Home Equipment: None      Prior Function Level of Independence: Independent          PT Goals (current goals can now be found in the care plan section) Acute Rehab PT Goals Patient Stated Goal: get home to my brother PT Goal Formulation: With patient Time For Goal Achievement: 03/20/15 Potential to Achieve Goals: Fair Progress towards PT goals: Progressing toward goals    Frequency  Min 3X/week    PT Plan Discharge plan needs to be updated    Co-evaluation             End of Session Equipment Utilized During Treatment: Gait belt Activity Tolerance: Patient tolerated treatment well Patient left: in chair;with call bell/phone within reach;with nursing/sitter in room     Time: 1415-1429 PT Time Calculation (min) (ACUTE ONLY): 14 min  Charges:  $Gait Training: 8-22 mins  G Codes:      Ralene BatheUhlenberg, Akina Maish Kistler 03/15/2015, 2:36 PM [phone]

## 2015-03-15 NOTE — Clinical Social Work Note (Addendum)
Clinical Social Work Assessment  Patient Details  Name: Bryce MawRobert Dicamillo MRN: [id number] Date of Birth: [birth year]  Date of referral:  03/15/15               Reason for consult:  Discharge Planning, Mental Health Concerns                Permission sought to share information with:  Case Manager Permission granted to share information::  Yes, Verbal Permission Granted  Name::        Agency::     Relationship::     Contact Information:     Housing/Transportation Living arrangements for the past 2 months:  Hotel/Motel (Per patient, with brother ) Source of Information:  Patient Patient Interpreter Needed:  None Criminal Activity/Legal Involvement Pertinent to Current Situation/Hospitalization:  No - Comment as needed Significant Relationships:  Siblings Lives with:  Siblings Do you feel safe going back to the place where you live?  Yes Need for family participation in patient care:  Yes (Comment)  Care giving concerns:  No caregiver concerns reported at this time.    Social Worker assessment / plan:  CSW went to speak with patient. Patient was alert and oriented, and appeared to be somewhat restless. Patient has been cleared by psych, and current denies SI/HI/AVH. CSW informed patient of PT recommendation for short term rehab. CSW also informed patient that he would have to pay privately for SNF placement. Patient informed CSW that he would prefer to go back and live with his brother in a motel. Patient voiced concerns with paying privately for SNF or Home health, stating he has no funds to afford either. CSW provided patient with words of encouragement. Patient informed CSW that he is interested in receiving resources for outpatient and residential substance abuse facilities. CSW provided him with a list of resources for alcohol and substance abuse. Patient also stated that he plans to follow up with AA, and that he believes staying with his brother has been helpful because he does not drink as  much. CSW provided patient with resources to [facility] and [facility] for his depression. Patient was appreciative of those services. Patient stated that he will need help with transportation. CSW to provide taxi cab for patient once medically stable.      Employment status:  Unemployed Health and safety inspectornsurance information:  Self Pay (Medicaid Pending) PT Recommendations:  Skilled Nursing Facility Information / Referral to community resources:  Outpatient Substance Abuse Treatment Options, Residential Substance Abuse Treatment Options  Patient/Family's Response to care:  Patient is appreciative of the resources and services provided by CSW.   Patient/Family's Understanding of and Emotional Response to Diagnosis, Current Treatment, and Prognosis:  Patient understanding of current treatment and prognosis. Patient is also aware of local resources useful in the community.   Emotional Assessment Appearance:  Appears stated age Attitude/Demeanor/Rapport:   (Calm and cooperative ) Affect (typically observed):  Accepting, Calm Orientation:  Oriented to Self, Oriented to Place, Oriented to  Time, Oriented to Situation Alcohol / Substance use:  Alcohol Use Psych involvement (Current and /or in the community):  Yes (Comment)  Discharge Needs  Concerns to be addressed:  Discharge Planning Concerns, Mental Health Concerns Readmission within the last 30 days:  No Current discharge risk:  None Barriers to Discharge:  Barriers Resolved   Loleta DickerJoyce S Derwood Becraft, LCSW 03/15/2015, 1:13 PM

## 2015-03-15 NOTE — Progress Notes (Signed)
[name] to be D/C'd Home per MD order.  Discussed prescriptions and follow up appointments with the patient. Prescriptions given to patient, medication list explained in detail. Pt verbalized understanding.    Medication List    STOP taking these medications        citalopram 20 MG tablet  Commonly known as:  CELEXA     lisinopril 10 MG tablet  Commonly known as:  PRINIVIL,ZESTRIL     traZODone 50 MG tablet  Commonly known as:  DESYREL      TAKE these medications        furosemide 20 MG tablet  Commonly known as:  LASIX  Take 1 tablet (20 mg total) by mouth daily.        Filed Vitals:   03/15/15 1300  BP: 96/74  Pulse: 94  Temp: 97.8 F (36.6 C)  Resp: 18    Skin clean, dry and intact without evidence of skin break down, no evidence of skin tears noted. IV catheter discontinued intact. Site without signs and symptoms of complications. Dressing and pressure applied. Pt denies pain at this time. No complaints noted.  An After Visit Summary was printed and given to the patient. Patient escorted via WC, and D/C home via private auto.  [name], Jaysin Gayler S 03/15/2015 4:57 PM

## 2015-03-15 NOTE — Evaluation (Signed)
Occupational Therapy Evaluation Patient Details Name: Bryce MawRobert [name] MRN: [id number] DOB: [birth year] Today's Date: 03/15/2015    History of Present Illness Pt is a 54 year old male with hx of alcohol abuse, tobacco abuse and depression well known to the behavioral health service who presented to the ED with complaints of one week of abd pain and distension and admitted for alcoholic hepatitis and ascities.   Clinical Impression   Pt admitted with abd pain. Pt currently with functional limitations due to the deficits listed below (see OT Problem List).  Pt will benefit from skilled OT to increase their safety and independence with ADL and functional mobility for ADL to facilitate discharge to venue listed below.     Follow Up Recommendations  Home health OT;Supervision/Assistance - 24 hour;SNF    Equipment Recommendations  None recommended by OT       Precautions / Restrictions Precautions Precautions: Fall      Mobility Bed Mobility               General bed mobility comments: pt up in recliner   Transfers Overall transfer level: Needs assistance Equipment used: Rolling walker (2 wheeled) Transfers: Sit to/from Stand Sit to Stand: Min assist         General transfer comment: verbal cues for hand placement and safe technique         ADL Overall ADL's : Needs assistance/impaired Eating/Feeding: Set up;Sitting   Grooming: Set up;Sitting   Upper Body Bathing: Minimal assitance;Sitting   Lower Body Bathing: Minimal assistance;Sit to/from stand   Upper Body Dressing : Set up;Sitting   Lower Body Dressing: Minimal assistance;Sit to/from stand   Toilet Transfer: Minimal assistance Toilet Transfer Details (indicate cue type and reason): sit to stand Toileting- Clothing Manipulation and Hygiene: Minimal assistance;Sit to/from stand         General ADL Comments: pt states that brother will A as needed.               Pertinent Vitals/Pain Pain  Assessment: No/denies pain     Hand Dominance     Extremity/Trunk Assessment Upper Extremity Assessment Upper Extremity Assessment: Generalized weakness           Communication Communication Communication: No difficulties   Cognition Arousal/Alertness: Awake/alert Behavior During Therapy: WFL for tasks assessed/performed Overall Cognitive Status: No family/caregiver present to determine baseline cognitive functioning Area of Impairment: Safety/judgement         Safety/Judgement: Decreased awareness of safety;Decreased awareness of deficits         General Comments               Home Living Family/patient expects to be discharged to:: Private residence Living Arrangements: Non-relatives/Friends Available Help at Discharge: Family Type of Home: Apartment Home Access: Level entry     Home Layout: One level     Bathroom Shower/Tub: Chief Strategy OfficerTub/shower unit   Bathroom Toilet: Standard     Home Equipment: None          Prior Functioning/Environment Level of Independence: Independent             OT Diagnosis: Generalized weakness   OT Problem List: Decreased strength;Decreased activity tolerance   OT Treatment/Interventions: Self-care/ADL training;DME and/or AE instruction;Patient/family education    OT Goals(Current goals can be found in the care plan section) Acute Rehab OT Goals Patient Stated Goal: get home to my brother OT Goal Formulation: With patient Time For Goal Achievement: 03/29/15 Potential to Achieve Goals: Good  OT Frequency: Min  2X/week              End of Session Nurse Communication: Mobility status  Activity Tolerance: Patient tolerated treatment well Patient left: in chair   Time: 4259-5638 OT Time Calculation (min): 15 min Charges:  OT General Charges $OT Visit: 1 Procedure OT Evaluation $Initial OT Evaluation Tier I: 1 Procedure G-Codes:    Alba Cory Apr 08, 2015, 1:29 PM

## 2015-03-18 LAB — CULTURE, BODY FLUID-BOTTLE

## 2015-03-18 LAB — CULTURE, BODY FLUID W GRAM STAIN -BOTTLE: Culture: NO GROWTH

## 2015-03-28 ENCOUNTER — Emergency Department (HOSPITAL_COMMUNITY): Payer: Self-pay

## 2015-03-28 ENCOUNTER — Inpatient Hospital Stay (HOSPITAL_COMMUNITY)
Admission: EM | Admit: 2015-03-28 | Discharge: 2015-03-29 | DRG: 434 | Disposition: A | Discharge status: Home / Self Care | Payer: Self-pay | Attending: Internal Medicine | Admitting: Internal Medicine

## 2015-03-28 ENCOUNTER — Encounter (HOSPITAL_COMMUNITY): Payer: Self-pay | Admitting: *Deleted

## 2015-03-28 ENCOUNTER — Observation Stay (HOSPITAL_COMMUNITY): Payer: Self-pay

## 2015-03-28 DIAGNOSIS — K7031 Alcoholic cirrhosis of liver with ascites: Principal | ICD-10-CM | POA: Diagnosis present

## 2015-03-28 DIAGNOSIS — R0602 Shortness of breath: Secondary | ICD-10-CM | POA: Insufficient documentation

## 2015-03-28 DIAGNOSIS — F329 Major depressive disorder, single episode, unspecified: Secondary | ICD-10-CM | POA: Diagnosis present

## 2015-03-28 DIAGNOSIS — F1020 Alcohol dependence, uncomplicated: Secondary | ICD-10-CM | POA: Diagnosis present

## 2015-03-28 DIAGNOSIS — R0902 Hypoxemia: Secondary | ICD-10-CM | POA: Diagnosis present

## 2015-03-28 DIAGNOSIS — R188 Other ascites: Secondary | ICD-10-CM | POA: Diagnosis present

## 2015-03-28 DIAGNOSIS — R609 Edema, unspecified: Secondary | ICD-10-CM

## 2015-03-28 DIAGNOSIS — F17210 Nicotine dependence, cigarettes, uncomplicated: Secondary | ICD-10-CM | POA: Diagnosis present

## 2015-03-28 DIAGNOSIS — Y908 Blood alcohol level of 240 mg/100 ml or more: Secondary | ICD-10-CM | POA: Diagnosis present

## 2015-03-28 DIAGNOSIS — Z9119 Patient's noncompliance with other medical treatment and regimen: Secondary | ICD-10-CM

## 2015-03-28 DIAGNOSIS — D7589 Other specified diseases of blood and blood-forming organs: Secondary | ICD-10-CM | POA: Diagnosis present

## 2015-03-28 DIAGNOSIS — Z79899 Other long term (current) drug therapy: Secondary | ICD-10-CM

## 2015-03-28 DIAGNOSIS — F1029 Alcohol dependence with unspecified alcohol-induced disorder: Secondary | ICD-10-CM

## 2015-03-28 LAB — CBC WITH DIFFERENTIAL/PLATELET
BASOS ABS: 0.1 10*3/uL (ref 0.0–0.1)
BASOS PCT: 1 %
EOS ABS: 0.1 10*3/uL (ref 0.0–0.7)
Eosinophils Relative: 1 %
HEMATOCRIT: 40 % (ref 39.0–52.0)
Hemoglobin: 14.1 g/dL (ref 13.0–17.0)
Lymphocytes Relative: 31 %
Lymphs Abs: 3.4 10*3/uL (ref 0.7–4.0)
MCH: 35.8 pg — ABNORMAL HIGH (ref 26.0–34.0)
MCHC: 35.3 g/dL (ref 30.0–36.0)
MCV: 101.5 fL — ABNORMAL HIGH (ref 78.0–100.0)
MONO ABS: 0.7 10*3/uL (ref 0.1–1.0)
MONOS PCT: 7 %
NEUTROS ABS: 6.6 10*3/uL (ref 1.7–7.7)
NEUTROS PCT: 60 %
PLATELETS: 147 10*3/uL — AB (ref 150–400)
RBC: 3.94 MIL/uL — ABNORMAL LOW (ref 4.22–5.81)
RDW: 12.8 % (ref 11.5–15.5)
WBC: 10.9 10*3/uL — ABNORMAL HIGH (ref 4.0–10.5)

## 2015-03-28 LAB — COMPREHENSIVE METABOLIC PANEL
ALK PHOS: 164 U/L — AB (ref 38–126)
ALT: 34 U/L (ref 17–63)
AST: 97 U/L — ABNORMAL HIGH (ref 15–41)
Albumin: 2.3 g/dL — ABNORMAL LOW (ref 3.5–5.0)
Anion gap: 10 (ref 5–15)
BILIRUBIN TOTAL: 1.6 mg/dL — AB (ref 0.3–1.2)
BUN: <5 mg/dL — ABNORMAL LOW (ref 6–20)
CALCIUM: 8.1 mg/dL — AB (ref 8.9–10.3)
CO2: 23 mmol/L (ref 22–32)
CREATININE: 0.6 mg/dL — AB (ref 0.61–1.24)
Chloride: 100 mmol/L — ABNORMAL LOW (ref 101–111)
GFR calc non Af Amer: >60 mL/min (ref >60)
Glucose, Bld: 126 mg/dL — ABNORMAL HIGH (ref 65–99)
Potassium: 3.5 mmol/L (ref 3.5–5.1)
Sodium: 133 mmol/L — ABNORMAL LOW (ref 135–145)
Total Protein: 6.2 g/dL — ABNORMAL LOW (ref 6.5–8.1)

## 2015-03-28 LAB — BODY FLUID CELL COUNT WITH DIFFERENTIAL
Eos, Fluid: 1 %
Lymphs, Fluid: 9 %
Monocyte-Macrophage-Serous Fluid: 55 % (ref 50–90)
Neutrophil Count, Fluid: 35 % — ABNORMAL HIGH (ref 0–25)
Total Nucleated Cell Count, Fluid: 74 cu mm (ref 0–1000)

## 2015-03-28 LAB — LACTATE DEHYDROGENASE: LDH: 185 U/L (ref 98–192)

## 2015-03-28 LAB — APTT: APTT: 39 s — AB (ref 24–37)

## 2015-03-28 LAB — GRAM STAIN

## 2015-03-28 LAB — ALBUMIN, FLUID (OTHER)

## 2015-03-28 LAB — PROTEIN, BODY FLUID

## 2015-03-28 LAB — PROTIME-INR
INR: 1.38 (ref 0.00–1.49)
PROTHROMBIN TIME: 17.1 s — AB (ref 11.6–15.2)

## 2015-03-28 LAB — LACTATE DEHYDROGENASE, PLEURAL OR PERITONEAL FLUID: LD, Fluid: 28 U/L — ABNORMAL HIGH (ref 3–23)

## 2015-03-28 LAB — ETHANOL: Alcohol, Ethyl (B): 311 mg/dL (ref <5)

## 2015-03-28 MED ORDER — CETYLPYRIDINIUM CHLORIDE 0.05 % MT LIQD
7.0000 mL | Freq: Two times a day (BID) | OROMUCOSAL | Status: DC
  Administered 2015-03-28 – 2015-03-29 (×3): 7 mL via OROMUCOSAL

## 2015-03-28 MED ORDER — ONDANSETRON HCL 4 MG/2ML IJ SOLN
4.0000 mg | Freq: Four times a day (QID) | INTRAMUSCULAR | Status: DC | PRN
  Administered 2015-03-28: 4 mg via INTRAVENOUS
  Filled 2015-03-28: qty 2

## 2015-03-28 MED ORDER — PREDNISONE 20 MG PO TABS
60.0000 mg | ORAL_TABLET | Freq: Once | ORAL | Status: DC
  Filled 2015-03-28: qty 3

## 2015-03-28 MED ORDER — ACETAMINOPHEN 650 MG RE SUPP: 650.0000 mg | Freq: Four times a day (QID) | RECTAL | Status: DC | PRN

## 2015-03-28 MED ORDER — HEPARIN SODIUM (PORCINE) 5000 UNIT/ML IJ SOLN: 5000.0000 [IU] | Freq: Three times a day (TID) | INTRAMUSCULAR | Status: DC

## 2015-03-28 MED ORDER — PANTOPRAZOLE SODIUM 40 MG IV SOLR
40.0000 mg | Freq: Two times a day (BID) | INTRAVENOUS | Status: DC
  Administered 2015-03-28 (×2): 40 mg via INTRAVENOUS
  Filled 2015-03-28 (×2): qty 40

## 2015-03-28 MED ORDER — HYDROMORPHONE HCL 1 MG/ML IJ SOLN: 1.0000 mg | INTRAMUSCULAR | Status: DC | PRN

## 2015-03-28 MED ORDER — ACETAMINOPHEN 325 MG PO TABS: 650.0000 mg | ORAL_TABLET | Freq: Four times a day (QID) | ORAL | Status: DC | PRN

## 2015-03-28 MED ORDER — DEXTROSE 5 % IV SOLN
2.0000 g | INTRAVENOUS | Status: DC
  Administered 2015-03-28: 2 g via INTRAVENOUS
  Filled 2015-03-28 (×2): qty 2

## 2015-03-28 MED ORDER — THIAMINE HCL 100 MG/ML IJ SOLN: 100.0000 mg | Freq: Every day | INTRAMUSCULAR | Status: DC

## 2015-03-28 MED ORDER — LIDOCAINE HCL (PF) 1 % IJ SOLN
INTRAMUSCULAR | Status: AC
  Filled 2015-03-28: qty 10

## 2015-03-28 MED ORDER — NICOTINE 21 MG/24HR TD PT24
21.0000 mg | MEDICATED_PATCH | Freq: Every day | TRANSDERMAL | Status: DC
  Administered 2015-03-28 – 2015-03-29 (×2): 21 mg via TRANSDERMAL
  Filled 2015-03-28 (×2): qty 1

## 2015-03-28 MED ORDER — OXYCODONE HCL 5 MG PO TABS
5.0000 mg | ORAL_TABLET | Freq: Four times a day (QID) | ORAL | Status: DC | PRN
  Administered 2015-03-28: 5 mg via ORAL
  Filled 2015-03-28: qty 1

## 2015-03-28 MED ORDER — LORAZEPAM 2 MG/ML IJ SOLN
0.0000 mg | Freq: Four times a day (QID) | INTRAMUSCULAR | Status: DC
  Administered 2015-03-28: 2 mg via INTRAVENOUS
  Filled 2015-03-28: qty 1

## 2015-03-28 MED ORDER — PREDNISONE 20 MG PO TABS
60.0000 mg | ORAL_TABLET | Freq: Once | ORAL | Status: AC
  Administered 2015-03-28: 60 mg via ORAL

## 2015-03-28 MED ORDER — ADULT MULTIVITAMIN W/MINERALS CH
1.0000 | ORAL_TABLET | Freq: Every day | ORAL | Status: DC
  Administered 2015-03-28 – 2015-03-29 (×2): 1 via ORAL
  Filled 2015-03-28 (×2): qty 1

## 2015-03-28 MED ORDER — LORAZEPAM 2 MG/ML IJ SOLN: 0.0000 mg | Freq: Two times a day (BID) | INTRAMUSCULAR | Status: DC

## 2015-03-28 MED ORDER — FOLIC ACID 1 MG PO TABS
1.0000 mg | ORAL_TABLET | Freq: Every day | ORAL | Status: DC
  Administered 2015-03-28 – 2015-03-29 (×2): 1 mg via ORAL
  Filled 2015-03-28 (×2): qty 1

## 2015-03-28 MED ORDER — VITAMIN B-1 100 MG PO TABS
100.0000 mg | ORAL_TABLET | Freq: Every day | ORAL | Status: DC
  Administered 2015-03-28 – 2015-03-29 (×2): 100 mg via ORAL
  Filled 2015-03-28 (×2): qty 1

## 2015-03-28 MED ORDER — IPRATROPIUM-ALBUTEROL 0.5-2.5 (3) MG/3ML IN SOLN
3.0000 mL | Freq: Once | RESPIRATORY_TRACT | Status: AC
  Administered 2015-03-28: 3 mL via RESPIRATORY_TRACT
  Filled 2015-03-28: qty 3

## 2015-03-28 MED ORDER — LORAZEPAM 1 MG PO TABS
1.0000 mg | ORAL_TABLET | Freq: Four times a day (QID) | ORAL | Status: DC | PRN
  Administered 2015-03-28: 1 mg via ORAL
  Filled 2015-03-28: qty 1

## 2015-03-28 MED ORDER — LORAZEPAM 2 MG/ML IJ SOLN
1.0000 mg | Freq: Four times a day (QID) | INTRAMUSCULAR | Status: DC | PRN
  Administered 2015-03-28: 1 mg via INTRAVENOUS
  Filled 2015-03-28: qty 1

## 2015-03-28 MED ORDER — CHLORHEXIDINE GLUCONATE 0.12 % MT SOLN: 15.0000 mL | Freq: Two times a day (BID) | OROMUCOSAL | Status: DC

## 2015-03-28 MED ORDER — ONDANSETRON HCL 4 MG PO TABS: 4.0000 mg | ORAL_TABLET | Freq: Four times a day (QID) | ORAL | Status: DC | PRN

## 2015-03-28 MED ORDER — CETYLPYRIDINIUM CHLORIDE 0.05 % MT LIQD
7.0000 mL | Freq: Two times a day (BID) | OROMUCOSAL | Status: DC
Start: 2015-03-28 — End: 2015-03-28

## 2015-03-28 MED ORDER — SODIUM CHLORIDE 0.9 % IJ SOLN
3.0000 mL | Freq: Two times a day (BID) | INTRAMUSCULAR | Status: DC
  Administered 2015-03-28 (×2): 3 mL via INTRAVENOUS

## 2015-03-28 NOTE — ED Notes (Signed)
Pt. Was seen a month ago for the same issue of leg swelling and abdominal distention. Pt. Was advised to stop drinking due to liver cirrhosis and pt has been drinking everyday for the past week.

## 2015-03-28 NOTE — Progress Notes (Signed)
ANTIBIOTIC CONSULT NOTE - INITIAL  Pharmacy Consult for Ceftriaxone  Indication: SBP Prophylaxis  No Known Allergies  Patient Measurements: Height: 6\' 1"  (185.4 cm) Weight: 160 lb 1.6 oz (72.621 kg) IBW/kg (Calculated) : 79.9  Vital Signs: Temp: 98.1 F (36.7 C) (10/30 0553) Temp Source: Oral (10/30 0553) BP: 113/79 mmHg (10/30 0553) Pulse Rate: 100 (10/30 0553)  Labs:  Recent Labs  03/28/15 0115  WBC 10.9*  HGB 14.1  PLT 147*  CREATININE 0.60*   Estimated Creatinine Clearance: 108.4 mL/min (by C-G formula based on Cr of 0.6).   Microbiology: Recent Results (from the past 720 hour(s))  Urine culture     Status: None   Collection Time: 03/12/15  5:39 PM  Result Value Ref Range Status   Specimen Description URINE, CLEAN CATCH  Final   Special Requests NONE  Final   Culture   Final    NO GROWTH 1 DAY [facility]formed [facility]    Report Status 03/13/2015 FINAL  Final  Culture, body fluid-bottle     Status: None   Collection Time: 03/13/15  9:25 AM  Result Value Ref Range Status   Specimen Description FLUID  Final   Special Requests NONE  Final   Culture   Final    NO GROWTH 5 DAYS Performed at [facility]    Report Status 03/18/2015 FINAL  Final  Gram stain     Status: None   Collection Time: 03/13/15  9:25 AM  Result Value Ref Range Status   Specimen Description FLUID  Final   Special Requests NONE  Final   Gram Stain   Final    WBC PRESENT,BOTH PMN AND MONONUCLEAR NO ORGANISMS SEEN CONFIRMED BY K.WOOTEN Performed at [facility]     Report Status 03/13/2015 FINAL  Final    Medical History: Past Medical History  Diagnosis Date  . ETOH abuse    Assessment: Ceftriaxone for SBP prophylaxis, WBC 10.9, other labs reviewed.   Plan:  -Ceftriaxone 2g IV q24h -F/U infectious w/u   Abran DukeLedford, Linkyn Gobin 03/28/2015,6:26 AM

## 2015-03-28 NOTE — H&P (Signed)
[facility] History and Physical  Patient: [name]  MRN: [id number]  DOB: 1961/03/30  DOS: the patient was seen and examined on 03/28/2015 PCP: No primary care provider on file.  Referring physician: Dr. Wilkie AyeHorton Chief Complaint: Abdominal distention  HPI: Bryce MawRobert Dunkleberger is a 54 y.o. male with Past medical history of alcohol abuse. Patient presents with complaints of abdominal distention. He mentions this has been ongoing for last 5 days. This is also started with increasing shortness of breath. Patient denies any vomiting but did have some nausea. Patient also complains of diarrhea with black color bowel movement. He denies any burning urination. He noted swelling of his legs ongoing for last 2-3 weeks. Patient drinks 6-740 ounce bottle of beer on a daily basis. Patient is also an active smoker.  The patient is coming from home.  At his baseline ambulates without a walker And is independent for most of his ADL; manages his medication on his own.  Review of Systems: as mentioned in the history of present illness.  A comprehensive review of the other systems is negative.  Past Medical History  Diagnosis Date  . ETOH abuse    Past Surgical History  Procedure Laterality Date  . No past surgeries      jaw surgery 2928yrs ago   Social History:  reports that he has been smoking Cigarettes.  He has a 40 pack-year smoking history. He has never used smokeless tobacco. He reports that he drinks about 4.8 oz of alcohol per week. He reports that he does not use illicit drugs.  No Known Allergies  Family History  Problem Relation Age of Onset  . Diabetes Mother     Prior to Admission medications   Medication Sig Start Date End Date Taking? Authorizing Provider  furosemide (LASIX) 20 MG tablet Take 1 tablet (20 mg total) by mouth daily. Patient not taking: Reported on 03/28/2015 03/15/15   Jerald KiefStephen K Chiu, MD    Physical Exam: Filed Vitals:   03/28/15 0430 03/28/15 0445  03/28/15 0515 03/28/15 0553  BP: 133/80 100/73 102/60 113/79  Pulse: 81 93 95 100  Temp:    98.1 F (36.7 C)  TempSrc:    Oral  Resp: 20 22 21 19   Height:    6\' 1"  (1.854 m)  Weight:    72.621 kg (160 lb 1.6 oz)  SpO2: 90% 89% 94% 97%    General: Alert, Awake and Oriented to Time, Place and Person. Appear in mild distress Eyes: PERRL ENT: Oral Mucosa clear moist. Neck: no JVD Cardiovascular: S1 and S2 Present, no Murmur, Peripheral Pulses Present Respiratory: Bilateral Air entry equal and Decreased,  Clear to Auscultation, no Crackles, no wheezes Abdomen: Bowel Sound present, Soft and diffuse tenderness Skin: no Rash Extremities: Bilateral Pedal edema, bilateral calf tenderness Neurologic: Grossly no focal neuro deficit.  Labs on Admission:  CBC:  Recent Labs Lab 03/28/15 0115  WBC 10.9*  NEUTROABS 6.6  HGB 14.1  HCT 40.0  MCV 101.5*  PLT 147*    CMP     Component Value Date/Time   NA 133* 03/28/2015 0115   K 3.5 03/28/2015 0115   CL 100* 03/28/2015 0115   CO2 23 03/28/2015 0115   GLUCOSE 126* 03/28/2015 0115   BUN <5* 03/28/2015 0115   CREATININE 0.60* 03/28/2015 0115   CALCIUM 8.1* 03/28/2015 0115   PROT 6.2* 03/28/2015 0115   ALBUMIN 2.3* 03/28/2015 0115   AST 97* 03/28/2015 0115   ALT 34 03/28/2015 0115  ALKPHOS 164* 03/28/2015 0115   BILITOT 1.6* 03/28/2015 0115   GFRNONAA >60 03/28/2015 0115   GFRAA >60 03/28/2015 0115    No results for input(s): CKTOTAL, CKMB, CKMBINDEX, TROPONINI in the last 168 hours. BNP (last 3 results) No results for input(s): BNP in the last 8760 hours.  ProBNP (last 3 results) No results for input(s): PROBNP in the last 8760 hours.   Radiological Exams on Admission: Dg Abd Acute W/chest  03/28/2015  CLINICAL DATA:  Abdominal pain and distention EXAM: DG ABDOMEN ACUTE W/ 1V CHEST COMPARISON:  Chest radiograph August 25, 2013; CT abdomen and pelvis March 12, 2015 FINDINGS: PA chest: There is no edema or  consolidation. The heart size and pulmonary vascularity are normal. No adenopathy. Supine and upright abdomen: There is no bowel dilatation or air-fluid level suggesting obstruction. No free air. There is moderate stool throughout the colon. There are scattered foci of arterial vascular calcification. IMPRESSION: Bowel gas pattern unremarkable. Moderate stool in colon. No lung edema or consolidation. Electronically Signed   By: Bretta Bang III M.D.   On: 03/28/2015 02:27    Assessment/Plan 1. Ascites Patient is presenting with compressive abdominal distention. Neck on the patient recently had paracentesis and now appears to be having reaccumulation. He is also complaining of some abdominal pain. The patient will be admitted in telemetry. I would order ultrasound paracentesis.  patient will be started on ceftriaxone for as baby prophylaxis. Current nothing by mouth.  2. Alcohol dependence (HCC) Continue with alcohol withdrawal prevention protocol.  3  Macrocytosis without anemia Melena. Patient will be given Protonix every 12 hours. Next and patient complains of foot black color bowel movement will check FOBT.  4  Hypoxia Patient did receive some breathing treatment and prednisone in the ER but currently appears to be having hypoxia most likely secondary to poor inspiratory effort due to ascites. We will continue close monitoring.  Nutrition: Nothing by mouth except medication DVT Prophylaxis: mechanical compression device  Advance goals of care discussion: Full code   Disposition: Admitted as observation, telemetry unit.  Author: Lynden Oxford, MD [facility] Pager: [phone] 03/28/2015  If 7PM-7AM, please contact night-coverage [email or web address] Password [email or web address]

## 2015-03-28 NOTE — Progress Notes (Signed)
New Admission Note:   Arrival Method: Via stretcher from the ED Mental Orientation:  A & O x 4.  Irritable. Telemetry: Placed on Tele Box [id number] Assessment: Completed Skin:  Patient refused full skin assessment.  This RN did observe abrasion to right knee and multiple tattoos IV:  RT AC NSL Pain: C/O severe abdominal pain - rating as 9/10 Tubes:  None Safety Measures: Safety Fall Prevention Plan has been discussed.  Does not want to look at papers at this time.  Patient has fallen multiple times in the past.  Bed alarm placed and floor mats placed to both sides of the bed. Admission: Patient refusing to do admission history at this time [address] Orientation: Patient has been orientated to the room, unit and staff.  Family:  None at the bedside.  Patient's initial CIWA score was 20.  MD aware and CIWA protocol initiated.  Patient made NPO.    Orders have been reviewed and implemented. Will continue to monitor the patient. Call light has been placed within reach and bed alarm has been activated.   Bernie CoveyKimberly Deitrich Steve RN- Marsa ArisBC, [place] Phone number: [phone]

## 2015-03-28 NOTE — Progress Notes (Signed)
[facility]                                                                              Patient Demographics  [name], is a 54 y.o. male, DOB - 03-21-1961, ZOX:[id number]  Admit date - 03/28/2015   Admitting Physician Bryce Salter, MD  Outpatient Primary MD for the patient is No primary care provider on file.  LOS - 0   Chief Complaint  Patient presents with  . Leg Swelling       Brief HPI   Patient is a 54 year old male with known history of alcohol abuse, cirrhosis, ascites, depression presented to the ED with abdominal distention, ongoing for the last 5 days. Patient also reported increasing shortness of breath, nausea, diarrhea with black colored bowel movement. He also noted swelling of his legs ongoing for last 2-3 weeks. Patient continues to drink daily 6-7 40 ounces bottles of beer, alcohol level 311at the time of admission.   Assessment & Plan    Principal Problem: Ascites presenting with shortness of breath, abdominal distention, lower extremity edema: In the setting of ongoing alcohol abuse and cirrhosis - Ultrasound-guided paracentesis pending today - Continue IV Rocephin for SBP prophylaxis  Active problems Alcohol abuse: With ongoing alcohol use and dependency - Placed on ciwa protocol with Ativan, thiamine, folate and MVI   Macrocytosis without anemia, ?Melena. - Continue IV PPI, obtain FOBT, place on clear liquid diet, hemoglobin stable  Nicotine abuse,? COPD - Currently no wheezing, patient received net rise of treatment and prednisone in ED. - Awaiting paracentesis, will likely help with breathing -Place on nicotine patch   Code Status: Full CODE STATUS   Family Communication: Discussed in detail with the patient, all imaging results, lab results explained to the patient    Disposition Plan:   Time Spent in minutes 25 minutes  Procedures  Paracentesis today  Consults   None  DVT Prophylaxis  SCD's  Medications  Scheduled Meds: . cefTRIAXone (ROCEPHIN)  IV  2 g Intravenous Q24H  . folic acid  1 mg Oral Daily  . lidocaine (PF)      . LORazepam  0-4 mg Intravenous Q6H   Followed by  . [START ON 03/30/2015] LORazepam  0-4 mg Intravenous Q12H  . multivitamin with minerals  1 tablet Oral Daily  . pantoprazole (PROTONIX) IV  40 mg Intravenous Q12H  . sodium chloride  3 mL Intravenous Q12H  . thiamine  100 mg Oral Daily   Or  . thiamine  100 mg Intravenous Daily   Continuous Infusions:  PRN Meds:.acetaminophen **OR** acetaminophen, HYDROmorphone (DILAUDID) injection, LORazepam **OR** LORazepam, ondansetron **OR** ondansetron (ZOFRAN) IV, oxyCODONE   Antibiotics   Anti-infectives    Start     Dose/Rate Route Frequency Ordered Stop   03/28/15 0630  cefTRIAXone (ROCEPHIN) 2 g in dextrose 5 % 50 mL IVPB     2 g 100 mL/hr over 30 Minutes Intravenous Every 24 hours 03/28/15 8119          Subjective:   [name] was seen and examined today. Somnolent, tachycardia, high CIWA. Denies any pain, chest pain, fevers or chills.  Abdominal distention++  Objective:   Blood pressure 110/79, pulse 100, temperature 98.1 F (36.7 C), temperature source Oral, resp. rate 19, height 6\' 1"  (1.854 m), weight 72.621 kg (160 lb 1.6 oz), SpO2 97 %.  Wt Readings from Last 3 Encounters:  03/28/15 72.621 kg (160 lb 1.6 oz)  03/12/15 81.647 kg (180 lb)  03/12/15 81.647 kg (180 lb)     Intake/Output Summary (Last 24 hours) at 03/28/15 1038 Last data filed at 03/28/15 0943  Gross per 24 hour  Intake      0 ml  Output      0 ml  Net      0 ml    Exam  General:Somnolent, NAD   HEENT:  PERRLA, EOMI, Anicteric Sclera, mucous membranes moist.   Neck: Supple, no JVD, no masses  CVS: S1 S2 auscultated, no rubs, murmurs or gallops. Regular rate and rhythm.  Respiratory: Clear to auscultation bilaterally, no wheezing,   Abdomen: Soft, abdominal distention with ascites   Ext: no  cyanosis clubbing, + edema  Neuro: no new deficit   Skin: No rashes  Psych: Somnolent   Data Review   Micro Results No results found for this or any previous visit (from the past 240 hour(s)).  Radiology Reports Ct Head Wo Contrast  03/12/2015  CLINICAL DATA:  Status post fall today.  Initial encounter. EXAM: CT HEAD WITHOUT CONTRAST TECHNIQUE: Contiguous axial images were obtained from the base of the skull through the vertex without intravenous contrast. COMPARISON:  None. FINDINGS: There is mild atrophy and chronic microvascular ischemic change. No evidence of acute abnormality including hemorrhage, infarct, mass lesion, mass effect, midline shift or abnormal extra-axial fluid collection is identified. No hydrocephalus or pneumocephalus. The calvarium is intact. Minimal mucosal thickening is seen in the maxillary sinuses and there is mild ethmoid air cell disease. The patient is status post maxillary antrostomy. Carotid atherosclerosis is noted. IMPRESSION: No acute abnormality. Mild atrophy and chronic microvascular ischemic change. Atherosclerosis. Mild sinus disease. Electronically Signed   By: [name] M.D.   On: 03/12/2015 16:57   Mr Lumbar Spine Wo Contrast  03/12/2015  CLINICAL DATA:  Bowel and bladder incontinence.  Cirrhosis EXAM: MRI LUMBAR SPINE WITHOUT CONTRAST TECHNIQUE: Multiplanar, multisequence MR imaging of the lumbar spine was performed. No intravenous contrast was administered. COMPARISON:  CT abdomen pelvis 03/12/2015 FINDINGS: The patient was not able to complete the study. Sagittal images were obtained which are of adequate quality. Axial images not obtained. Normal lumbar alignment. Negative for fracture or mass. No significant disc degeneration or disc protrusion. There is prominent epidural lipomatosis narrowing the canal at L5 and S1. Conus medullaris normal. Markedly enlarged urinary bladder. Recommend Foley catheter. Ascites also present. IMPRESSION: No  significant disc protrusion or spinal stenosis. 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 Conus medullaris normal Markedly distended urinary bladder.  Ascites. Electronically Signed   By: [name] M.D.   On: 03/12/2015 20:29   Ct Abdomen Pelvis W Contrast  03/12/2015  CLINICAL DATA:  Abdominal pain and distention for 1 week. Left lower quadrant pain evaluate for diverticulitis EXAM: CT ABDOMEN AND PELVIS WITH CONTRAST TECHNIQUE: Multidetector CT imaging of the abdomen and pelvis was performed using the standard protocol following bolus administration of intravenous contrast. CONTRAST:  OMNIPAQUE IOHEXOL 300 MG/ML SOLN, 25mL OMNIPAQUE IOHEXOL 300 MG/ML SOLN COMPARISON:  None. FINDINGS: Lung bases are clear.  No effusions.  Heart is normal size. Heterogeneous appearance of the liver with nodular contours. Findings compatible with  cirrhosis. There is associated moderate ascites in the abdomen and pelvis. There are gallstones within the gallbladder. Gallbladder is distended. No biliary ductal dilatation. Spleen, pancreas, adrenals are unremarkable. Small cysts in the kidneys bilaterally. No hydronephrosis. Aorta and iliac vessels are calcified, non aneurysmal. Urinary bladder is distended. Stomach and small bowel are decompressed and unremarkable. Large bowel decompressed, grossly unremarkable. Large bowel decompressed, grossly unremarkable. No adenopathy. No acute bony abnormality or focal bone lesion. IMPRESSION: Changes of cirrhosis with associated moderate ascites. Layering gallstones within the gallbladder. Gallbladder is moderately distended. No evidence of diverticulosis or diverticulitis. Electronically Signed   By: Charlett Nose M.D.   On: 03/12/2015 14:44   US Paracentesis  03/13/2015  INDICATION: ascites EXAM: ULTRASOUND-GUIDED PARACENTESIS COMPARISON:  None. MEDICATIONS: 10 cc 1% lidocaine COMPLICATIONS: None immediate TECHNIQUE: Informed written consent was obtained from the patient after a  discussion of the risks, benefits and alternatives to treatment. A timeout was performed prior to the initiation of the procedure. Initial ultrasound scanning demonstrates a large amount of ascites within the left lower abdominal quadrant. The left lower abdomen was prepped and draped in the usual sterile fashion. 1% lidocaine with epinephrine was used for local anesthesia. Under direct ultrasound guidance, a 19 gauge, 7-cm, Yueh catheter was introduced. An ultrasound image was saved for documentation purposed. The paracentesis was performed. The catheter was removed and a dressing was applied. The patient tolerated the procedure well without immediate post procedural complication. FINDINGS: A total of approximately 2.5 liters of yellow fluid was removed. Samples were sent to the laboratory as requested by the clinical team. IMPRESSION: Successful ultrasound-guided paracentesis yielding 2.5 liters of peritoneal fluid. Read by:  Robet Leu [facility] Electronically Signed   By: Jolaine Click M.D.   On: 03/13/2015 13:43   Dg Abd Acute W/chest  03/28/2015  CLINICAL DATA:  Abdominal pain and distention EXAM: DG ABDOMEN ACUTE W/ 1V CHEST COMPARISON:  Chest radiograph August 25, 2013; CT abdomen and pelvis March 12, 2015 FINDINGS: PA chest: There is no edema or consolidation. The heart size and pulmonary vascularity are normal. No adenopathy. Supine and upright abdomen: There is no bowel dilatation or air-fluid level suggesting obstruction. No free air. There is moderate stool throughout the colon. There are scattered foci of arterial vascular calcification. IMPRESSION: Bowel gas pattern unremarkable. Moderate stool in colon. No lung edema or consolidation. Electronically Signed   By: Bretta Bang III M.D.   On: 03/28/2015 02:27    CBC  Recent Labs Lab 03/28/15 0115  WBC 10.9*  HGB 14.1  HCT 40.0  PLT 147*  MCV 101.5*  MCH 35.8*  MCHC 35.3  RDW 12.8  LYMPHSABS 3.4  MONOABS 0.7  EOSABS 0.1   BASOSABS 0.1    Chemistries   Recent Labs Lab 03/28/15 0115  NA 133*  K 3.5  CL 100*  CO2 23  GLUCOSE 126*  BUN <5*  CREATININE 0.60*  CALCIUM 8.1*  AST 97*  ALT 34  ALKPHOS 164*  BILITOT 1.6*   ------------------------------------------------------------------------------------------------------------------ estimated creatinine clearance is 108.4 mL/min (by C-G formula based on Cr of 0.6). ------------------------------------------------------------------------------------------------------------------ No results for input(s): HGBA1C in the last 72 hours. ------------------------------------------------------------------------------------------------------------------ No results for input(s): CHOL, HDL, LDLCALC, TRIG, CHOLHDL, LDLDIRECT in the last 72 hours. ------------------------------------------------------------------------------------------------------------------ No results for input(s): TSH, T4TOTAL, T3FREE, THYROIDAB in the last 72 hours.  Invalid input(s): FREET3 ------------------------------------------------------------------------------------------------------------------ No results for input(s): VITAMINB12, FOLATE, FERRITIN, TIBC, IRON, RETICCTPCT in the last 72 hours.  Coagulation profile  Recent Labs  Lab 03/28/15 0657  INR 1.38    No results for input(s): DDIMER in the last 72 hours.  Cardiac Enzymes No results for input(s): CKMB, TROPONINI, MYOGLOBIN in the last 168 hours.  Invalid input(s): CK ------------------------------------------------------------------------------------------------------------------ Invalid input(s): POCBNP  No results for input(s): GLUCAP in the last 72 hours.   Orianna Biskup M.D. [facility] 03/28/2015, 10:38 AM  Pager: [phone] Between 7am to 7pm - call Pager - [phone]  After 7pm go to [email or web address] - password [email or web address]  Call night coverage person covering after 7pm

## 2015-03-28 NOTE — ED Notes (Signed)
While ambulating pt. SPO2 dropped to 88% and HR was 126. Pt. Was wheezing with ambulation

## 2015-03-28 NOTE — Procedures (Signed)
US paracentesis R lateral abd Clear pale yellow ascites, sample for requested labs No complication No blood loss. See complete dictation in [facility].

## 2015-03-28 NOTE — ED Provider Notes (Signed)
CSN: [id number]     Arrival date & time 03/28/15  0058 History  By signing my name below, I, Phillis Haggis, attest that this documentation has been prepared under the direction and in the presence of Shon Baton, MD. Electronically Signed: Phillis Haggis, ED Scribe. 03/28/2015. 2:30 AM.  Chief Complaint  Patient presents with  . Leg Swelling   The history is provided by the patient. No language interpreter was used.   HPI Comments: [name] is a 54 y.o. male with a hx of ETOH abuse who presents to the Emergency Department complaining of lower leg swelling and abdominal distention onset earlier today. Pt states that his leg swelling is new today but his abdominal distention is recurrent since one month ago because he continues to drink alcohol daily. He currently rates his pain 9/10 and reports associated SOB. He states that he has not eaten anything in 4 days because he cannot keep any food down with his chronic nausea, vomiting and diarrhea.  He denies chest pain, fever, or chills. He reports that he also smokes daily and has a hx of cirrhosis and COPD. He states that he has had fluid drained from his liver in the past.   Past Medical History  Diagnosis Date  . ETOH abuse    Past Surgical History  Procedure Laterality Date  . No past surgeries      jaw surgery 13yrs ago   Family History  Problem Relation Age of Onset  . Diabetes Mother    Social History  Substance Use Topics  . Smoking status: Current Every Day Smoker -- 1.00 packs/day for 40 years    Types: Cigarettes  . Smokeless tobacco: Never Used  . Alcohol Use: 4.8 oz/week    8 Cans of beer per week     Comment: drinks daily-drinks whatever is available    Review of Systems  Constitutional: Negative for fever and chills.  Respiratory: Positive for shortness of breath.   Cardiovascular: Positive for leg swelling.  Gastrointestinal: Positive for abdominal distention. Negative for nausea and vomiting.  Skin:  Negative for wound.  All other systems reviewed and are negative.     Allergies  Review of patient's allergies indicates no known allergies.  Home Medications   Prior to Admission medications   Medication Sig Start Date End Date Taking? Authorizing Provider  furosemide (LASIX) 20 MG tablet Take 1 tablet (20 mg total) by mouth daily. Patient not taking: Reported on 03/28/2015 03/15/15   Jerald Kief, MD   BP 107/72 mmHg  Pulse 100  Temp(Src) 98.1 F (36.7 C) (Oral)  Resp 19  Ht  (1.854 m)  Wt 160 lb 1.6 oz (72.621 kg)  BMI 21.13 kg/m2  SpO2 97% Physical Exam  Constitutional: He is oriented to person, place, and time. No distress.  Chronically ill-appearing  HENT:  Head: Normocephalic and atraumatic.  Mucous membranes dry  Eyes: Pupils are equal, round, and reactive to light.  Cardiovascular: Normal rate, regular rhythm and normal heart sounds.   No murmur heard. Pulmonary/Chest: Effort normal. No respiratory distress. He has wheezes. He has no rales.  Abdominal: Soft. Bowel sounds are normal. He exhibits distension. There is tenderness. There is no rebound.  Positive fluid wave, diffuse tenderness without rebound or guarding  Musculoskeletal: He exhibits edema.  2+ bilateral lower extremity edema  Neurological: He is alert and oriented to person, place, and time.  Skin: Skin is warm and dry.  Psychiatric: He has a normal mood  and affect.  Nursing note and vitals reviewed.   ED Course  Procedures (including critical care time) DIAGNOSTIC STUDIES: Oxygen Saturation is 96% on RA, normal by my interpretation.    COORDINATION OF CARE: 1:35 AM-Discussed treatment plan which includes labs with pt at bedside and pt agreed to plan.   Labs Review Labs Reviewed  CBC WITH DIFFERENTIAL/PLATELET - Abnormal; Notable for the following:    WBC 10.9 (*)    RBC 3.94 (*)    MCV 101.5 (*)    MCH 35.8 (*)    Platelets 147 (*)    All other components within normal limits   COMPREHENSIVE METABOLIC PANEL - Abnormal; Notable for the following:    Sodium 133 (*)    Chloride 100 (*)    Glucose, Bld 126 (*)    BUN <5 (*)    Creatinine, Ser 0.60 (*)    Calcium 8.1 (*)    Total Protein 6.2 (*)    Albumin 2.3 (*)    AST 97 (*)    Alkaline Phosphatase 164 (*)    Total Bilirubin 1.6 (*)    All other components within normal limits  ETHANOL - Abnormal; Notable for the following:    Alcohol, Ethyl (B) 311 (*)    All other components within normal limits  PROTIME-INR - Abnormal; Notable for the following:    Prothrombin Time 17.1 (*)    All other components within normal limits  APTT - Abnormal; Notable for the following:    aPTT 39 (*)    All other components within normal limits  LACTATE DEHYDROGENASE, BODY FLUID - Abnormal; Notable for the following:    LD, Fluid 28 (*)    All other components within normal limits  BODY FLUID CELL COUNT WITH DIFFERENTIAL - Abnormal; Notable for the following:    Appearance, Fluid HAZY (*)    Neutrophil Count, Fluid 35 (*)    All other components within normal limits  CULTURE, BODY FLUID-BOTTLE  GRAM STAIN  GRAM STAIN  LACTATE DEHYDROGENASE  ALBUMIN, FLUID  PROTEIN, BODY FLUID  COMPREHENSIVE METABOLIC PANEL  CBC      Dg Abd Acute W/chest  03/28/2015  CLINICAL DATA:  Abdominal pain and distention EXAM: DG ABDOMEN ACUTE W/ 1V CHEST COMPARISON:  Chest radiograph August 25, 2013; CT abdomen and pelvis March 12, 2015 FINDINGS: PA chest: There is no edema or consolidation. The heart size and pulmonary vascularity are normal. No adenopathy. Supine and upright abdomen: There is no bowel dilatation or air-fluid level suggesting obstruction. No free air. There is moderate stool throughout the colon. There are scattered foci of arterial vascular calcification. IMPRESSION: Bowel gas pattern unremarkable. Moderate stool in colon. No lung edema or consolidation. Electronically Signed   By: Bretta BangWilliam  Woodruff III M.D.   On:  03/28/2015 02:27   I have personally reviewed and evaluated these images and lab results as part of my medical decision-making.   EKG Interpretation   Date/Time:  Sunday March 28 2015 01:15:44 EDT Ventricular Rate:  117 PR Interval:  136 QRS Duration: 98 QT Interval:  345 QTC Calculation: 481 R Axis:   92 Text Interpretation:  Sinus tachycardia Borderline right axis deviation  Borderline prolonged QT interval Baseline wander in lead(s) V6 Confirmed  by Clarene Curran  MD, Kallen Mccrystal ([id number]) on 03/28/2015 2:40:09 AM      MDM   Final diagnoses:  Alcoholic cirrhosis of liver with ascites (HCC)  Shortness of breath    Patient presents with abdominal pain and shortness  of breath as well as abdominal distention. History of liver cirrhosis. Continues to drink. Nontoxic on exam. Initial vital signs notable for tachycardia to 129. Pulse ox 94%. No acute respiratory distress. Patient also has a history of COPD and is wheezing. He appears volume overloaded with marked ascites.  Shortness of breath is likely a combination of COPD and abdominal volume compressing the lungs. Lab work obtained. Lab work is mostly at the patient's baseline.  Acute abdominal series reassuring. Patient was given a DuoNeb and steroids given history of COPD. Patient ambulated and dropped his pulse ox into the mid 80s and became extremely tachycardic.  The patient needs admission for therapeutic paracentesis and further management. Discussed with Dr. Allena Katz.  After history, exam, and medical workup I feel the patient has been appropriately medically screened and is safe for discharge home. Pertinent diagnoses were discussed with the patient. Patient was given return precautions.  I personally performed the services described in this documentation, which was scribed in my presence. The recorded information has been reviewed and is accurate.    Shon Baton, MD 03/29/15 [id number]

## 2015-03-28 NOTE — Progress Notes (Signed)
*  Preliminary Results* Bilateral lower extremity venous duplex completed. Bilateral lower extremities are negative for deep vein thrombosis. There is no evidence of Baker's cyst bilaterally.  03/28/2015  Marycruz Boehner, RVT, RDCS, RDMS  

## 2015-03-29 LAB — COMPREHENSIVE METABOLIC PANEL
ALBUMIN: 2 g/dL — AB (ref 3.5–5.0)
ALK PHOS: 129 U/L — AB (ref 38–126)
ALT: 29 U/L (ref 17–63)
AST: 73 U/L — AB (ref 15–41)
Anion gap: 10 (ref 5–15)
CALCIUM: 8.4 mg/dL — AB (ref 8.9–10.3)
CO2: 22 mmol/L (ref 22–32)
CREATININE: 0.71 mg/dL (ref 0.61–1.24)
Chloride: 99 mmol/L — ABNORMAL LOW (ref 101–111)
GFR calc non Af Amer: >60 mL/min (ref >60)
GLUCOSE: 136 mg/dL — AB (ref 65–99)
Potassium: 3.8 mmol/L (ref 3.5–5.1)
SODIUM: 131 mmol/L — AB (ref 135–145)
Total Bilirubin: 2 mg/dL — ABNORMAL HIGH (ref 0.3–1.2)
Total Protein: 5.6 g/dL — ABNORMAL LOW (ref 6.5–8.1)

## 2015-03-29 LAB — PATHOLOGIST SMEAR REVIEW

## 2015-03-29 LAB — CBC
HCT: 38.9 % — ABNORMAL LOW (ref 39.0–52.0)
Hemoglobin: 13.5 g/dL (ref 13.0–17.0)
MCH: 35.7 pg — AB (ref 26.0–34.0)
MCHC: 34.7 g/dL (ref 30.0–36.0)
MCV: 102.9 fL — AB (ref 78.0–100.0)
PLATELETS: 82 10*3/uL — AB (ref 150–400)
RBC: 3.78 MIL/uL — ABNORMAL LOW (ref 4.22–5.81)
RDW: 12.9 % (ref 11.5–15.5)
WBC: 7.2 10*3/uL (ref 4.0–10.5)

## 2015-03-29 MED ORDER — LORAZEPAM 0.5 MG PO TABS
0.5000 mg | ORAL_TABLET | Freq: Two times a day (BID) | ORAL | Status: DC
  Administered 2015-03-29: 0.5 mg via ORAL
  Filled 2015-03-29: qty 1

## 2015-03-29 MED ORDER — NICOTINE 21 MG/24HR TD PT24: 21.0000 mg | MEDICATED_PATCH | Freq: Every day | TRANSDERMAL | Status: DC

## 2015-03-29 MED ORDER — SPIRONOLACTONE 50 MG PO TABS: 50.0000 mg | ORAL_TABLET | Freq: Every day | ORAL | Status: DC

## 2015-03-29 MED ORDER — PANTOPRAZOLE SODIUM 40 MG PO TBEC: 40.0000 mg | DELAYED_RELEASE_TABLET | Freq: Every day | ORAL | Status: DC

## 2015-03-29 MED ORDER — PANTOPRAZOLE SODIUM 40 MG PO TBEC
40.0000 mg | DELAYED_RELEASE_TABLET | Freq: Two times a day (BID) | ORAL | Status: DC
  Administered 2015-03-29: 40 mg via ORAL
  Filled 2015-03-29: qty 1

## 2015-03-29 MED ORDER — LORAZEPAM 0.5 MG PO TABS: ORAL_TABLET | ORAL | Status: DC

## 2015-03-29 MED ORDER — THIAMINE HCL 100 MG PO TABS: 100.0000 mg | ORAL_TABLET | Freq: Every day | ORAL | Status: DC

## 2015-03-29 MED ORDER — FOLIC ACID 1 MG PO TABS: 1.0000 mg | ORAL_TABLET | Freq: Every day | ORAL | Status: DC

## 2015-03-29 NOTE — Care Management Note (Signed)
Case Management Note  Patient Details  Name: [name] MRN: [id number] Date of Birth: [birth year]  Subjective/Objective:      CM following for progression and d/c planning.              Action/Plan: Met with pt and explained appointment as arranged at [place]. Pt concerned about transportation, lives here in [place] and encouraged to use the bus to travel to this appointment. CSW B Megan Salon notified of plan to d/c and will provide pt with rehab resources.   Expected Discharge Date:    03/29/2015              Expected Discharge Plan:  Homeless Shelter  In-House Referral:  Clinical Social Work  Discharge planning Services  CM Consult, Follow-up appt scheduled  Post Acute Care Choice:  NA Choice offered to:  NA  DME Arranged:  N/A DME Agency:  NA  HH Arranged:  NA HH Agency:  NA  Status of Service:  Completed, signed off  Medicare Important Message Given:    Date Medicare IM Given:    Medicare IM give by:    Date Additional Medicare IM Given:    Additional Medicare Important Message give by:     If discussed at [place] of Stay Meetings, dates discussed:    Additional Comments:  Adron Bene, RN 03/29/2015, 10:18 AM

## 2015-03-29 NOTE — Evaluation (Signed)
Physical Therapy Evaluation Patient Details Name: Bryce MawRobert [name] MRN: [id number] DOB: 1961/04/11 Today's Date: 03/29/2015   History of Present Illness  Patient is a 54 year old male with known history of alcohol abuse, cirrhosis, ascites, depression presented to the ED with abdominal distention, ongoing for the last 5 days. Patient also reported increasing shortness of breath, nausea, diarrhea with black colored bowel movement. He also noted swelling of his legs ongoing for last 2-3 weeks. Patient continues to drink daily 6-7 40 ounces bottles of beer, alcohol level 311at the time of admission.  Clinical Impression  Pt presenting with LE edema and abd distension. Pt unsafe to amb without RW and requires use of RW for safe transfers and ambulation. Pt does not leave motel room much and reports to be sedentary most of the time. Suspect pt to be functioning near baseline. Pt reports daughter does their grocery shopping checks on him frequently. Acute PT to con't to follow to improve indep with mobility.    Follow Up Recommendations No PT follow up;Supervision/Assistance - 24 hour    Equipment Recommendations  None recommended by PT (has RW)    Recommendations for Other Services       Precautions / Restrictions Precautions Precautions: Fall Restrictions Weight Bearing Restrictions: No      Mobility  Bed Mobility               General bed mobility comments: pt recieved on commode  Transfers Overall transfer level: Needs assistance Equipment used: Rolling walker (2 wheeled) Transfers: Sit to/from Stand Sit to Stand: Min guard         General transfer comment: v/c's for safe hand placement  Ambulation/Gait Ambulation/Gait assistance: Min guard Ambulation Distance (Feet): 200 Feet Assistive device: Rolling walker (2 wheeled) Gait Pattern/deviations: Step-through pattern Gait velocity: wfl Gait velocity interpretation: <1.8 ft/sec, indicative of risk for recurrent  falls General Gait Details: pt ambulated 15 from bathroom to EOB and requird modA to prevent fall. pt given RW for amb in hallway and pt more steady only requiring min guard/supervision. pt extremely unsafe to amb without RW  Stairs            Wheelchair Mobility    Modified Rankin (Stroke Patients Only)       Balance Overall balance assessment: Needs assistance         Standing balance support: During functional activity;No upper extremity supported Standing balance-Leahy Scale: Fair Standing balance comment: pt able to stand at sink and wash hands without LOB                             Pertinent Vitals/Pain Pain Assessment: 0-10 Pain Score: 4  Pain Location: abdomen Pain Descriptors / Indicators: Aching    Home Living Family/patient expects to be discharged to::  (motel) Living Arrangements: Other relatives (brother) Available Help at Discharge: Family;Available 24 hours/day Type of Home:  (motel) Home Access: Level entry     Home Layout: One level Home Equipment: Walker - 2 wheels Additional Comments: pt reports his brother is always there and that daughter takes them to the grocery store    Prior Function Level of Independence: Independent with assistive device(s)         Comments: uses RW at home and complete ADLs on own, daughter does driving     Hand Dominance   Dominant Hand: Right    Extremity/Trunk Assessment   Upper Extremity Assessment: Generalized weakness  Lower Extremity Assessment: Generalized weakness RLE Deficits / Details:  (below knee edema) LLE Deficits / Details:  (below knee edema)  Cervical / Trunk Assessment: Normal  Communication   Communication: No difficulties  Cognition Arousal/Alertness: Awake/alert Behavior During Therapy: WFL for tasks assessed/performed Overall Cognitive Status: Impaired/Different from baseline Area of Impairment: Safety/judgement         Safety/Judgement:  Decreased awareness of safety     General Comments: pt aware he needs his walker but requires v/c's to actually use it, pt stood up from toliet too fast and required modA to prevent fall    General Comments General comments (skin integrity, edema, etc.): pt with noted ascites and bilat LE edema    Exercises        Assessment/Plan    PT Assessment Patient needs continued PT services  PT Diagnosis Difficulty walking;Generalized weakness   PT Problem List Decreased strength;Decreased activity tolerance;Decreased mobility;Decreased balance;Decreased knowledge of use of DME;Decreased coordination  PT Treatment Interventions DME instruction;Gait training;Functional mobility training;Patient/family education;Therapeutic activities;Therapeutic exercise   PT Goals (Current goals can be found in the Care Plan section) Acute Rehab PT Goals Patient Stated Goal: get home to my brother PT Goal Formulation: With patient Time For Goal Achievement: 04/05/15 Potential to Achieve Goals: Good    Frequency Min 3X/week   Barriers to discharge        Co-evaluation               End of Session Equipment Utilized During Treatment: Gait belt Activity Tolerance: Patient tolerated treatment well Patient left: in chair;with call bell/phone within reach;with chair alarm set Nurse Communication: Mobility status         Time: 1610-9604 PT Time Calculation (min) (ACUTE ONLY): 24 min   Charges:   PT Evaluation $Initial PT Evaluation Tier I: 1 Procedure PT Treatments $Gait Training: 8-22 mins   PT G CodesMarcene [name] 03/29/2015, 9:04 AM   Lewis Shock, PT, DPT Pager #: [phone] Office #: [phone]

## 2015-03-29 NOTE — Clinical Social Work Note (Signed)
SA resources, AA meeting schedule, and two bus passes provided to patient. CSW signing off.   Roddie McBryant Javarus Dorner MSW, [place] Village[place]CSW, [place], [phone]

## 2015-03-29 NOTE — Discharge Summary (Signed)
Physician Discharge Summary   Patient ID: [name] MRN: [id number] DOB/AGE: 31-May-1960 54 y.o.  Admit date: 03/28/2015 Discharge date: 03/29/2015  Primary Care Physician:  No primary care provider on file.  Discharge Diagnoses:    . Alcoholic cirrhosis of liver with ascites (HCC) . Ascites . Alcohol dependence (HCC) . Hyperbilirubinemia . Macrocytosis without anemia . severe noncompliance  Consults:  None   Recommendations for Outpatient Follow-up:  Patient was placed on Aldactone 50 mg daily. If he is able to tolerate from BP standpoint, then will benefit from addition of Lasix 20 mg daily and up titrate Aldactone   TESTS THAT NEED FOLLOW-UP BMET at the time of follow-up appointment [facility] the [facility]   DIET: 2 g sodium diet    Allergies:  No Known Allergies   Discharge Medications:   Medication List    STOP taking these medications        furosemide 20 MG tablet  Commonly known as:  LASIX      TAKE these medications        folic acid 1 MG tablet  Commonly known as:  FOLVITE  Take 1 tablet (1 mg total) by mouth daily.     LORazepam 0.5 MG tablet  Commonly known as:  ATIVAN  Take ativan 0.5mg  twice a day for 2 days, then 0.5mg  once a day for 2 days, then as needed for anxiety/shakiness     nicotine 21 mg/24hr patch  Commonly known as:  NICODERM CQ - dosed in mg/24 hours  Place 1 patch (21 mg total) onto the skin daily.     pantoprazole 40 MG tablet  Commonly known as:  PROTONIX  Take 1 tablet (40 mg total) by mouth daily.     spironolactone 50 MG tablet  Commonly known as:  ALDACTONE  Take 1 tablet (50 mg total) by mouth daily.     thiamine 100 MG tablet  Take 1 tablet (100 mg total) by mouth daily.         Brief H and P: For complete details please refer to admission H and P, but in brief Patient is a 54 year old male with known history of alcohol abuse, cirrhosis, ascites, depression presented to the ED with abdominal  distention, ongoing for the last 5 days. Patient also reported increasing shortness of breath, nausea, diarrhea with black colored bowel movement. He also noted swelling of his legs ongoing for last 2-3 weeks. Patient continues to drink daily 6-7 40 ounces bottles of beer, alcohol level 311at the time of admission.  Hospital Course:  Ascites due to alcoholic cirrhosis presenting with shortness of breath, abdominal distention, lower extremity edema: In the setting of ongoing alcohol abuse and cirrhosis -Patient was admitted. Interventional radiology was consulted for ultrasound-guided paracentesis. He underwent Therapeutic and diagnostic paracentesis, 4.6 L of clear pale yellow ascites fluid was aspirated. -  patient was placed on IV Rocephin for SBP prophylaxis. Ascitic fluid studies was negative for SBP.  Alcohol abuse: With ongoing alcohol use and dependency -  patient was placed on ciwa protocol with Ativan, thiamine, folate and MVI. He remained stable and did not go through any DTs. Patient was strongly recommended to quit drinking alcohol, alcohol cessation resources were provided by social work.   Alcoholic cirrhosis Patient was placed on Aldactone 50 mg daily. If he is able to tolerate with BP, may benefit from addition of low-dose Lasix and up titration of Aldactone. Please obtain BMET at the time of follow-up appointment at the  community [employer].  Macrocytosis without anemia, likely due to alcohol use -MCV 102, Patient had no GI bleeding. Patient was placed on thiamine and folate.  Nicotine abuse,? COPD - Currently no wheezing, patient received  breathing treatment and prednisone in ED. - Patient's shortness of breath considerably improved after paracentesis. He was strongly recommended to quit smoking. Nicotine patch was provided.   Day of Discharge BP 111/77 mmHg  Pulse 97  Temp(Src) 97.6 F (36.4 C) (Oral)  Resp 18  Ht  (1.854 m)  Wt 72.1 kg (158 lb 15.2 oz)   BMI 20.98 kg/m2  SpO2 98%  Physical Exam: General: Alert and awake oriented x3 not in any acute distress. HEENT: anicteric sclera, pupils reactive to light and accommodation CVS: S1-S2 clear no murmur rubs or gallops Chest: clear to auscultation bilaterally, no wheezing rales or rhonchi Abdomen: soft nontender,  distention improved , normal bowel sounds Extremities: no cyanosis, clubbing, + edema noted bilaterally Neuro: Cranial nerves II-XII intact, no focal neurological deficits   The results of significant diagnostics from this hospitalization (including imaging, microbiology, ancillary and laboratory) are listed below for reference.    LAB RESULTS: Basic Metabolic Panel:  Recent Labs Lab 03/28/15 0115 03/29/15 1020  NA 133* 131*  K 3.5 3.8  CL 100* 99*  CO2 23 22  GLUCOSE 126* 136*  BUN <5* <5*  CREATININE 0.60* 0.71  CALCIUM 8.1* 8.4*   Liver Function Tests:  Recent Labs Lab 03/28/15 0115 03/29/15 1020  AST 97* 73*  ALT 34 29  ALKPHOS 164* 129*  BILITOT 1.6* 2.0*  PROT 6.2* 5.6*  ALBUMIN 2.3* 2.0*   No results for input(s): LIPASE, AMYLASE in the last 168 hours. No results for input(s): AMMONIA in the last 168 hours. CBC:  Recent Labs Lab 03/28/15 0115 03/29/15 1020  WBC 10.9* 7.2  NEUTROABS 6.6  --   HGB 14.1 13.5  HCT 40.0 38.9*  MCV 101.5* 102.9*  PLT 147* PENDING   Cardiac Enzymes: No results for input(s): CKTOTAL, CKMB, CKMBINDEX, TROPONINI in the last 168 hours. BNP: Invalid input(s): POCBNP CBG: No results for input(s): GLUCAP in the last 168 hours.  Significant Diagnostic Studies:  US Paracentesis  03/28/2015  CLINICAL DATA:  Cirrhosis, ascites EXAM: ULTRASOUND GUIDED PARACENTESIS TECHNIQUE: The procedure, risks (including but not limited to bleeding, infection, organ damage ), benefits, and alternatives were explained to the patient. Questions regarding the procedure were encouraged and answered. The patient understands and consents  to the procedure. Survey ultrasound of the abdomen was performed and an appropriate skin entry site in the right lateral abdomen was selected. Skin site was marked, prepped with Betadine, and draped in usual sterile fashion, and infiltrated locally with 1% lidocaine. A Safe-T-Centesis sheath needle was advanced into the peritoneal space until fluid could be aspirated. The sheath was advanced and the needle removed. 4.6 L of clear pale yellowascites were aspirated. COMPLICATIONS: COMPLICATIONS none IMPRESSION: Technically successful ultrasound guided paracentesis, removing 4.6 L of ascites. Electronically Signed   By: Corlis Leak M.D.   On: 03/28/2015 12:08   Dg Abd Acute W/chest  03/28/2015  CLINICAL DATA:  Abdominal pain and distention EXAM: DG ABDOMEN ACUTE W/ 1V CHEST COMPARISON:  Chest radiograph August 25, 2013; CT abdomen and pelvis March 12, 2015 FINDINGS: PA chest: There is no edema or consolidation. The heart size and pulmonary vascularity are normal. No adenopathy. Supine and upright abdomen: There is no bowel dilatation or air-fluid level suggesting obstruction. No free air. There  is moderate stool throughout the colon. There are scattered foci of arterial vascular calcification. IMPRESSION: Bowel gas pattern unremarkable. Moderate stool in colon. No lung edema or consolidation. Electronically Signed   By: Bretta BangWilliam  Woodruff III M.D.   On: 03/28/2015 02:27    2D ECHO:   Disposition and Follow-up: Discharge Instructions    Diet - low sodium heart healthy    Complete by:  As directed      Discharge instructions    Complete by:  As directed   Please quit drinking alcohol.     Increase activity slowly    Complete by:  As directed             DISPOSITION: HOME    DISCHARGE FOLLOW-UP     Follow-up Information    Follow up with [facility].   Why:  Appointment: Friday, April 02, 2015 at 3:30pm. Please call if you are unable to keep this appointment.    Contact information:   [address] [place] [id number] [phone]       Time spent on Discharge: 25 mins   Signed:   Chazz Philson M.D. [facility] 03/29/2015, 11:26 AM Pager: [phone]

## 2015-03-30 ENCOUNTER — Emergency Department (HOSPITAL_COMMUNITY)
Admission: EM | Admit: 2015-03-30 | Discharge: 2015-03-31 | Disposition: A | Discharge status: Home / Self Care | Payer: Self-pay | Attending: Emergency Medicine | Admitting: Emergency Medicine

## 2015-03-30 ENCOUNTER — Encounter (HOSPITAL_COMMUNITY): Payer: Self-pay | Admitting: Emergency Medicine

## 2015-03-30 DIAGNOSIS — Z79899 Other long term (current) drug therapy: Secondary | ICD-10-CM | POA: Insufficient documentation

## 2015-03-30 DIAGNOSIS — R600 Localized edema: Secondary | ICD-10-CM

## 2015-03-30 DIAGNOSIS — R2243 Localized swelling, mass and lump, lower limb, bilateral: Secondary | ICD-10-CM | POA: Insufficient documentation

## 2015-03-30 DIAGNOSIS — Z720 Tobacco use: Secondary | ICD-10-CM | POA: Insufficient documentation

## 2015-03-30 DIAGNOSIS — K7031 Alcoholic cirrhosis of liver with ascites: Secondary | ICD-10-CM | POA: Insufficient documentation

## 2015-03-30 MED ORDER — LORAZEPAM 1 MG PO TABS
0.5000 mg | ORAL_TABLET | Freq: Once | ORAL | Status: AC
  Administered 2015-03-31: 0.5 mg via ORAL
  Filled 2015-03-30: qty 1

## 2015-03-30 MED ORDER — CLINDAMYCIN HCL 150 MG PO CAPS: 300.0000 mg | ORAL_CAPSULE | Freq: Four times a day (QID) | ORAL | Status: DC

## 2015-03-30 MED ORDER — FOLIC ACID 1 MG PO TABS
1.0000 mg | ORAL_TABLET | Freq: Once | ORAL | Status: AC
  Administered 2015-03-31: 1 mg via ORAL
  Filled 2015-03-30: qty 1

## 2015-03-30 MED ORDER — CLINDAMYCIN HCL 300 MG PO CAPS
300.0000 mg | ORAL_CAPSULE | Freq: Once | ORAL | Status: AC
  Administered 2015-03-31: 300 mg via ORAL
  Filled 2015-03-30: qty 2
  Filled 2015-03-30: qty 1

## 2015-03-30 MED ORDER — ALBUTEROL SULFATE HFA 108 (90 BASE) MCG/ACT IN AERS
2.0000 | INHALATION_SPRAY | Freq: Once | RESPIRATORY_TRACT | Status: AC
  Administered 2015-03-31: 2 via RESPIRATORY_TRACT
  Filled 2015-03-30: qty 6.7

## 2015-03-30 MED ORDER — SPIRONOLACTONE 50 MG PO TABS
50.0000 mg | ORAL_TABLET | Freq: Once | ORAL | Status: AC
Start: 2015-03-30 — End: 2015-03-31
  Administered 2015-03-31: 50 mg via ORAL
  Filled 2015-03-30: qty 1

## 2015-03-30 MED ORDER — VITAMIN B-1 100 MG PO TABS
100.0000 mg | ORAL_TABLET | Freq: Once | ORAL | Status: AC
  Administered 2015-03-31: 100 mg via ORAL
  Filled 2015-03-30: qty 1

## 2015-03-30 MED ORDER — PANTOPRAZOLE SODIUM 40 MG PO TBEC
40.0000 mg | DELAYED_RELEASE_TABLET | Freq: Once | ORAL | Status: AC
  Administered 2015-03-31: 40 mg via ORAL
  Filled 2015-03-30: qty 1

## 2015-03-30 NOTE — ED Notes (Signed)
Little MD at bedside. 

## 2015-03-30 NOTE — ED Provider Notes (Signed)
CSN: [id number]     Arrival date & time 03/30/15  2311 History  By signing my name below, I, Soijett Blue, attest that this documentation has been prepared under the direction and in the presence of Laurence Spates, MD. Electronically Signed: Soijett Blue, ED Scribe. 03/30/2015. 11:49 PM.  Chief Complaint  Patient presents with  . Abdominal Pain  . Leg Swelling      The history is provided by the patient. No language interpreter was used.    HPI Comments: [name] is a 54 y.o. male with a medical hx of liver cirrhosis with ascites and COPD, who presents to the Emergency Department complaining of recurrent generalized abdominal pain/distension onset today. Per pt chart review, Pt was admitted this week and d/c yesterday for abdominal distension. He had a theraputic paracentesis of 4.6 Liters. He was given ceftriaxone in the hospital but ascitic fluid was negative for infection on analysis. He reports today while in the ED that there is fluid leaking from the paracentesis site that was performed yesterday while the pt was in the hospital. He notes that he has not changed the bandage since the procedure but he was soaked through the shirt that he had on. He states that he has not taken his medicines today due to him not having money to buy them. He states that [facility]. He states that he had 1 beer today at 9:30 AM and that he normally drinks 4-5 40 oz beers daily. He notes that he has not had that high amount in several days. He came in today due to his leg swelling/pain and the leaking from the paracentesis site.   He states that he is having associated symptoms of bilateral leg swelling, wheezing worsened in the morning, chronic productive cough, and redness to his abdominal area. No escalation of cough or increased SOB. He denies noticing the redness to his abdomen while in the hospital or after being d/c. He notes that he has a medical hx of  COPD and he has not had his Rx for an inhaler filled due to his financial issues. He states that he has not tried any medications for the relief of his symptoms. He denies fever, vomiting, and any other symptoms.   Past Medical History  Diagnosis Date  . ETOH abuse    Past Surgical History  Procedure Laterality Date  . No past surgeries      jaw surgery 63yrs ago   Family History  Problem Relation Age of Onset  . Diabetes Mother    Social History  Substance Use Topics  . Smoking status: Current Every Day Smoker -- 1.00 packs/day for 40 years    Types: Cigarettes  . Smokeless tobacco: Never Used  . Alcohol Use: 4.8 oz/week    8 Cans of beer per week     Comment: drinks daily-drinks whatever is available    Review of Systems 10 Systems reviewed and all are negative for acute change except as noted in the HPI.   Allergies  Review of patient's allergies indicates no known allergies.  Home Medications   Prior to Admission medications   Medication Sig Start Date End Date Taking? Authorizing Provider  clindamycin (CLEOCIN) 150 MG capsule Take 2 capsules (300 mg total) by mouth 4 (four) times daily. 03/30/15   Laurence Spates, MD  folic acid (FOLVITE) 1 MG tablet Take 1 tablet (1 mg total) by mouth daily. 03/29/15   Ripudeep K  Rai, MD  LORazepam (ATIVAN) 0.5 MG tablet Take ativan 0.5mg  twice a day for 2 days, then 0.5mg  once a day for 2 days, then as needed for anxiety/shakiness 03/29/15   Ripudeep K Rai, MD  nicotine (NICODERM CQ - DOSED IN MG/24 HOURS) 21 mg/24hr patch Place 1 patch (21 mg total) onto the skin daily. 03/29/15   Ripudeep Jenna Luo, MD  pantoprazole (PROTONIX) 40 MG tablet Take 1 tablet (40 mg total) by mouth daily. 03/29/15   Ripudeep Jenna Luo, MD  spironolactone (ALDACTONE) 50 MG tablet Take 1 tablet (50 mg total) by mouth daily. 03/29/15   Ripudeep Jenna Luo, MD  thiamine 100 MG tablet Take 1 tablet (100 mg total) by mouth daily. 03/29/15   Ripudeep K Rai, MD   BP  118/97 mmHg  Pulse 96  Temp(Src) 97.9 F (36.6 C) (Oral)  Resp 17  SpO2 100% Physical Exam  Constitutional: He is oriented to person, place, and time.  Chronically ill appearing and appears older than stated age.  HENT:  Head: Normocephalic and atraumatic.  Mouth/Throat: Oropharynx is clear and moist.  Moist mucous membranes  Eyes: Pupils are equal, round, and reactive to light.  Sclera icterus. Dilated pupils.   Neck: Neck supple.  Cardiovascular: Normal rate, regular rhythm and normal heart sounds.   No murmur heard. Pulmonary/Chest: Effort normal. He has wheezes.  Diffuse inspiratory and expiratory wheezes bilaterally. Good air movement.   Abdominal: Soft. Bowel sounds are normal. He exhibits distension. There is tenderness. There is no rebound and no guarding.  distended but soft. Mild genereralized TTP without rebound or guarding. Pinpoint hole in right paraumbillical abdomen with slow clear fluid leak.   Musculoskeletal: He exhibits no edema.  3+ pitting edema to bilateral knees.   Neurological: He is alert and oriented to person, place, and time.  Fluent speech  Skin: Skin is warm and dry. There is erythema.  Faint erythema on lower abdomen.   Psychiatric: He has a normal mood and affect. Judgment normal.  Nursing note and vitals reviewed.   ED Course  Procedures (including critical care time) DIAGNOSTIC STUDIES: Oxygen Saturation is 100% on RA, nl by my interpretation.    COORDINATION OF CARE: 11:30 PM Discussed treatment plan with pt at bedside which includes labs, UA, albuterol inhaler, folvite, thiamine tablet, aldactone, protonix, cleocin, and ativan and pt agreed to plan.    Medications  albuterol (PROVENTIL HFA;VENTOLIN HFA) 108 (90 BASE) MCG/ACT inhaler 2 puff (2 puffs Inhalation Given 03/31/15 0008)  folic acid (FOLVITE) tablet 1 mg (1 mg Oral Given 03/31/15 0008)  thiamine (VITAMIN B-1) tablet 100 mg (100 mg Oral Given 03/31/15 0008)  spironolactone  (ALDACTONE) tablet 50 mg (50 mg Oral Given 03/31/15 0008)  pantoprazole (PROTONIX) EC tablet 40 mg (40 mg Oral Given 03/31/15 0009)  clindamycin (CLEOCIN) capsule 300 mg (300 mg Oral Given 03/31/15 0008)  LORazepam (ATIVAN) tablet 0.5 mg (0.5 mg Oral Given 03/31/15 0008)     MDM   Final diagnoses:  Ascites due to alcoholic cirrhosis (HCC)  Bilateral leg edema   54 year old male with alcoholic cirrhosis with ascites who presents with ongoing bilateral lower extremity edema and fluid leakage from his paracentesis site that was performed yesterday. Patient chronically ill-appearing but in no acute distress at presentation. Vital signs unremarkable. He had moderate abdominal distention but abdomen was soft. Slow drip of clear ascitic fluid from pinpoint hole from paracentesis. He had faint erythema over his lower abdomen without induration. Regarding fluid leakage, I  suspect that his leak will slow down and eventually resolved if he is compliant with diuretics. Gave him his home medications in the ED and contacted case management [facility]. My suspicion for cellulitis of his abdomen is low however given his comorbidities and immunosuppressed state, gave him a dose of clindamycin and prescription to treat cellulitis. Also provided with albuterol inhaler. Instructed to follow-up in the morning for his medications and emphasized importance of medication compliance. Patient already has appointment scheduled in 3 days and I reminded him of his appointment time. Return precautions reviewed. Patient voiced understanding and was discharged in satisfactory condition.  I personally performed the services described in this documentation, which was scribed in my presence. The recorded information has been reviewed and is accurate.     Laurence Spatesachel Morgan Little, MD 03/31/15 [phone]

## 2015-03-30 NOTE — ED Notes (Signed)
Pt reports leaking fluid from paracentesis site that was performed yesterday; pts abd distended and pt c/o generalized abd pain; pt has hx of liver cirrhosis with acsites

## 2015-03-31 MED ORDER — ALBUTEROL SULFATE HFA 108 (90 BASE) MCG/ACT IN AERS
INHALATION_SPRAY | RESPIRATORY_TRACT | Status: AC
  Filled 2015-03-31: qty 6.7

## 2015-03-31 NOTE — ED Notes (Signed)
Pt here via [facility]; pt verbalizes no ride home from ED after discharge; pt states no money for cab or bus; Case manager assisted patient with medication assistance and is to pick up medications in the morning; pt does not meet criteria for ride home from [facility]; RN discussed situation with Consulting civil engineerCharge RN and they suggested patient be given bus ticket, wait in lobby til outpatient pharm opens and then use bus ticket to go home; pt verbalized understanding of plan; nurse 1st informed of same

## 2015-03-31 NOTE — Discharge Instructions (Signed)
Ascites Ascites is a collection of excess fluid in the abdomen. Ascites can range from mild to severe. It can get worse without treatment. CAUSES Possible causes include:  Cirrhosis. This is the most common cause of ascites.  Infection or inflammation in the abdomen.  Cancer in the abdomen.  Heart failure.  Kidney disease.  Inflammation of the pancreas.  Clots in the veins of the liver. SIGNS AND SYMPTOMS Signs and symptoms may include:  A feeling of fullness in your abdomen. This is common.  An increase in the size of your abdomen or your waist.  Swelling in your legs.  Swelling of the scrotum in men.  Difficulty breathing.  Abdominal pain.  Sudden weight gain. If the condition is mild, you may not have symptoms. DIAGNOSIS To make a diagnosis, your health care provider will:  Ask about your medical history.  Perform a physical exam.  Order imaging tests, such as an ultrasound or CT scan of your abdomen. TREATMENT Treatment depends on the cause of the ascites. It may include:  Taking a pill to make you urinate. This is called a water pill (diuretic pill).  Strictly reducing your salt (sodium) intake. Salt can cause extra fluid to be kept in the body, and this makes ascites worse.  Having a procedure to remove fluid from your abdomen (paracentesis).  Having a procedure to transfer fluid from your abdomen into a vein.  Having a procedure that connects two of the major veins within your liver and relieves pressure on your liver (TIPS procedure). Ascites may go away or improve with treatment of the condition that caused it.  HOME CARE INSTRUCTIONS  Keep track of your weight. To do this, weigh yourself at the same time every day and record your weight.  Keep track of how much you drink and any changes in the amount you urinate.  Follow any instructions that your health care provider gives you about how much to drink.  Try not to eat salty (high-sodium)  foods.  Take medicines only as directed by your health care provider.  Keep all follow-up visits as directed by your health care provider. This is important.  Report any changes in your health to your health care provider, especially if you develop new symptoms or your symptoms get worse. SEEK MEDICAL CARE IF:  Your gain more than 3 pounds in 3 days.  Your abdominal size or your waist size increases.  You have new swelling in your legs.  The swelling in your legs gets worse. SEEK IMMEDIATE MEDICAL CARE IF:  You develop a fever.  You develop confusion.  You develop new or worsening difficulty breathing.  You develop new or worsening abdominal pain.  You develop new or worsening swelling in the scrotum (in men).   This information is not intended to replace advice given to you by your health care provider. Make sure you discuss any questions you have with your health care provider.   Document Released: 05/15/2005 Document Revised: 06/05/2014 Document Reviewed: 12/12/2013 Elsevier Interactive Patient Education 2016 [employer]   IT IS VERY IMPORTANT FOR YOU TO GET YOUR PRESCRIPTIONS FROM [facility] AND TAKE ALL MEDICINES AS DIRECTED.**

## 2015-04-02 ENCOUNTER — Inpatient Hospital Stay: Payer: Self-pay | Admitting: Family Medicine

## 2015-04-02 LAB — CULTURE, BODY FLUID-BOTTLE: CULTURE: NO GROWTH

## 2015-04-02 LAB — CULTURE, BODY FLUID W GRAM STAIN -BOTTLE

## 2015-04-10 ENCOUNTER — Encounter (HOSPITAL_COMMUNITY): Payer: Self-pay

## 2015-04-10 ENCOUNTER — Inpatient Hospital Stay (HOSPITAL_COMMUNITY)
Admission: EM | Admit: 2015-04-10 | Discharge: 2015-04-15 | DRG: 432 | Disposition: A | Discharge status: Home / Self Care | Payer: Self-pay | Attending: Internal Medicine | Admitting: Internal Medicine

## 2015-04-10 ENCOUNTER — Emergency Department (HOSPITAL_COMMUNITY): Payer: Self-pay

## 2015-04-10 DIAGNOSIS — K7031 Alcoholic cirrhosis of liver with ascites: Principal | ICD-10-CM | POA: Diagnosis present

## 2015-04-10 DIAGNOSIS — Y95 Nosocomial condition: Secondary | ICD-10-CM | POA: Diagnosis present

## 2015-04-10 DIAGNOSIS — F17210 Nicotine dependence, cigarettes, uncomplicated: Secondary | ICD-10-CM | POA: Diagnosis present

## 2015-04-10 DIAGNOSIS — Z23 Encounter for immunization: Secondary | ICD-10-CM

## 2015-04-10 DIAGNOSIS — J189 Pneumonia, unspecified organism: Secondary | ICD-10-CM | POA: Diagnosis present

## 2015-04-10 DIAGNOSIS — F1020 Alcohol dependence, uncomplicated: Secondary | ICD-10-CM | POA: Diagnosis present

## 2015-04-10 DIAGNOSIS — E875 Hyperkalemia: Secondary | ICD-10-CM | POA: Diagnosis present

## 2015-04-10 DIAGNOSIS — Z79899 Other long term (current) drug therapy: Secondary | ICD-10-CM

## 2015-04-10 DIAGNOSIS — E8770 Fluid overload, unspecified: Secondary | ICD-10-CM | POA: Diagnosis present

## 2015-04-10 DIAGNOSIS — D6959 Other secondary thrombocytopenia: Secondary | ICD-10-CM | POA: Diagnosis present

## 2015-04-10 DIAGNOSIS — E876 Hypokalemia: Secondary | ICD-10-CM | POA: Diagnosis present

## 2015-04-10 DIAGNOSIS — Z833 Family history of diabetes mellitus: Secondary | ICD-10-CM

## 2015-04-10 DIAGNOSIS — R188 Other ascites: Secondary | ICD-10-CM

## 2015-04-10 DIAGNOSIS — E871 Hypo-osmolality and hyponatremia: Secondary | ICD-10-CM | POA: Diagnosis present

## 2015-04-10 LAB — BODY FLUID CELL COUNT WITH DIFFERENTIAL
LYMPHS FL: 30 %
MONOCYTE-MACROPHAGE-SEROUS FLUID: 48 % — AB (ref 50–90)
NEUTROPHIL FLUID: 22 % (ref 0–25)
WBC FLUID: 76 uL (ref 0–1000)

## 2015-04-10 LAB — CBC WITH DIFFERENTIAL/PLATELET
BASOS ABS: 0.1 10*3/uL (ref 0.0–0.1)
BASOS PCT: 1 %
EOS ABS: 0.1 10*3/uL (ref 0.0–0.7)
Eosinophils Relative: 1 %
HEMATOCRIT: 43 % (ref 39.0–52.0)
Hemoglobin: 15.3 g/dL (ref 13.0–17.0)
Lymphocytes Relative: 20 %
Lymphs Abs: 2 10*3/uL (ref 0.7–4.0)
MCH: 36 pg — ABNORMAL HIGH (ref 26.0–34.0)
MCHC: 35.6 g/dL (ref 30.0–36.0)
MCV: 101.2 fL — ABNORMAL HIGH (ref 78.0–100.0)
MONO ABS: 1 10*3/uL (ref 0.1–1.0)
Monocytes Relative: 10 %
NEUTROS ABS: 7 10*3/uL (ref 1.7–7.7)
Neutrophils Relative %: 70 %
Platelets: 125 10*3/uL — ABNORMAL LOW (ref 150–400)
RBC: 4.25 MIL/uL (ref 4.22–5.81)
RDW: 13 % (ref 11.5–15.5)
WBC: 10 10*3/uL (ref 4.0–10.5)

## 2015-04-10 LAB — COMPREHENSIVE METABOLIC PANEL
ALBUMIN: 2.6 g/dL — AB (ref 3.5–5.0)
ALT: 41 U/L (ref 17–63)
AST: 109 U/L — AB (ref 15–41)
Alkaline Phosphatase: 178 U/L — ABNORMAL HIGH (ref 38–126)
Anion gap: 10 (ref 5–15)
BILIRUBIN TOTAL: 2.5 mg/dL — AB (ref 0.3–1.2)
CHLORIDE: 94 mmol/L — AB (ref 101–111)
CO2: 25 mmol/L (ref 22–32)
Calcium: 8.2 mg/dL — ABNORMAL LOW (ref 8.9–10.3)
Creatinine, Ser: 0.55 mg/dL — ABNORMAL LOW (ref 0.61–1.24)
GFR calc Af Amer: >60 mL/min (ref >60)
GFR calc non Af Amer: >60 mL/min (ref >60)
GLUCOSE: 111 mg/dL — AB (ref 65–99)
POTASSIUM: 3.3 mmol/L — AB (ref 3.5–5.1)
SODIUM: 129 mmol/L — AB (ref 135–145)
Total Protein: 6.7 g/dL (ref 6.5–8.1)

## 2015-04-10 LAB — LIPASE, BLOOD: Lipase: 84 U/L — ABNORMAL HIGH (ref 11–51)

## 2015-04-10 LAB — PROTIME-INR
INR: 1.3 (ref 0.00–1.49)
Prothrombin Time: 16.3 seconds — ABNORMAL HIGH (ref 11.6–15.2)

## 2015-04-10 LAB — I-STAT CG4 LACTIC ACID, ED: Lactic Acid, Venous: 3.96 mmol/L (ref 0.5–2.0)

## 2015-04-10 MED ORDER — PIPERACILLIN-TAZOBACTAM 3.375 G IVPB
3.3750 g | Freq: Three times a day (TID) | INTRAVENOUS | Status: DC
  Administered 2015-04-11 – 2015-04-12 (×5): 3.375 g via INTRAVENOUS
  Filled 2015-04-10 (×5): qty 50

## 2015-04-10 MED ORDER — FUROSEMIDE 10 MG/ML IJ SOLN: 20.0000 mg | Freq: Once | INTRAMUSCULAR | Status: DC

## 2015-04-10 MED ORDER — DEXTROSE 5 % IV SOLN
1.0000 g | Freq: Three times a day (TID) | INTRAVENOUS | Status: DC
  Filled 2015-04-10: qty 1

## 2015-04-10 MED ORDER — PANTOPRAZOLE SODIUM 40 MG IV SOLR
40.0000 mg | INTRAVENOUS | Status: DC
  Administered 2015-04-10 – 2015-04-13 (×4): 40 mg via INTRAVENOUS
  Filled 2015-04-10 (×4): qty 40

## 2015-04-10 MED ORDER — INFLUENZA VAC SPLIT QUAD 0.5 ML IM SUSY
0.5000 mL | PREFILLED_SYRINGE | INTRAMUSCULAR | Status: AC
  Administered 2015-04-11: 0.5 mL via INTRAMUSCULAR
  Filled 2015-04-10: qty 0.5

## 2015-04-10 MED ORDER — IPRATROPIUM-ALBUTEROL 0.5-2.5 (3) MG/3ML IN SOLN
3.0000 mL | Freq: Once | RESPIRATORY_TRACT | Status: AC
  Administered 2015-04-10: 3 mL via RESPIRATORY_TRACT
  Filled 2015-04-10: qty 3

## 2015-04-10 MED ORDER — MORPHINE SULFATE (PF) 2 MG/ML IV SOLN
1.0000 mg | INTRAVENOUS | Status: DC | PRN
  Administered 2015-04-10 – 2015-04-11 (×2): 1 mg via INTRAVENOUS
  Filled 2015-04-10 (×2): qty 1

## 2015-04-10 MED ORDER — PNEUMOCOCCAL VAC POLYVALENT 25 MCG/0.5ML IJ INJ
0.5000 mL | INJECTION | INTRAMUSCULAR | Status: AC
  Administered 2015-04-11: 0.5 mL via INTRAMUSCULAR
  Filled 2015-04-10: qty 0.5

## 2015-04-10 MED ORDER — VITAMIN B-1 100 MG PO TABS
100.0000 mg | ORAL_TABLET | Freq: Every day | ORAL | Status: DC
  Administered 2015-04-11 – 2015-04-15 (×5): 100 mg via ORAL
  Filled 2015-04-10 (×5): qty 1

## 2015-04-10 MED ORDER — LIDOCAINE-EPINEPHRINE (PF) 2 %-1:200000 IJ SOLN: 10.0000 mL | Freq: Once | INTRAMUSCULAR | Status: DC

## 2015-04-10 MED ORDER — SODIUM CHLORIDE 0.9 % IV BOLUS (SEPSIS)
1000.0000 mL | INTRAVENOUS | Status: AC
  Administered 2015-04-10: 1000 mL via INTRAVENOUS

## 2015-04-10 MED ORDER — ENOXAPARIN SODIUM 40 MG/0.4ML ~~LOC~~ SOLN
40.0000 mg | Freq: Every day | SUBCUTANEOUS | Status: DC
  Administered 2015-04-11 – 2015-04-13 (×3): 40 mg via SUBCUTANEOUS
  Filled 2015-04-10 (×4): qty 0.4

## 2015-04-10 MED ORDER — POTASSIUM CHLORIDE 20 MEQ PO PACK: 20.0000 meq | PACK | Freq: Once | ORAL | Status: DC

## 2015-04-10 MED ORDER — POTASSIUM CHLORIDE CRYS ER 20 MEQ PO TBCR
20.0000 meq | EXTENDED_RELEASE_TABLET | Freq: Once | ORAL | Status: AC
  Administered 2015-04-10: 20 meq via ORAL
  Filled 2015-04-10: qty 1

## 2015-04-10 MED ORDER — VANCOMYCIN HCL IN DEXTROSE 1-5 GM/200ML-% IV SOLN
1000.0000 mg | Freq: Three times a day (TID) | INTRAVENOUS | Status: DC
  Administered 2015-04-11 – 2015-04-12 (×4): 1000 mg via INTRAVENOUS
  Filled 2015-04-10 (×6): qty 200

## 2015-04-10 MED ORDER — SODIUM CHLORIDE 0.9 % IV BOLUS (SEPSIS)
500.0000 mL | INTRAVENOUS | Status: AC
  Administered 2015-04-10: 500 mL via INTRAVENOUS

## 2015-04-10 MED ORDER — ONDANSETRON HCL 4 MG/2ML IJ SOLN
4.0000 mg | Freq: Four times a day (QID) | INTRAMUSCULAR | Status: DC | PRN
  Administered 2015-04-11: 4 mg via INTRAVENOUS
  Filled 2015-04-10: qty 2

## 2015-04-10 MED ORDER — VANCOMYCIN HCL 10 G IV SOLR
1500.0000 mg | Freq: Once | INTRAVENOUS | Status: AC
  Administered 2015-04-10: 1500 mg via INTRAVENOUS
  Filled 2015-04-10: qty 1500

## 2015-04-10 MED ORDER — ALBUTEROL SULFATE (2.5 MG/3ML) 0.083% IN NEBU: 2.5000 mg | INHALATION_SOLUTION | RESPIRATORY_TRACT | Status: DC | PRN

## 2015-04-10 MED ORDER — FENTANYL CITRATE (PF) 100 MCG/2ML IJ SOLN
100.0000 ug | Freq: Once | INTRAMUSCULAR | Status: AC
  Administered 2015-04-10: 100 ug via INTRAVENOUS
  Filled 2015-04-10: qty 2

## 2015-04-10 MED ORDER — PIPERACILLIN-TAZOBACTAM 3.375 G IVPB
3.3750 g | Freq: Once | INTRAVENOUS | Status: AC
  Administered 2015-04-10: 3.375 g via INTRAVENOUS
  Filled 2015-04-10: qty 50

## 2015-04-10 MED ORDER — DEXTROSE 5 % IV SOLN
1.0000 g | Freq: Once | INTRAVENOUS | Status: DC
  Filled 2015-04-10: qty 1

## 2015-04-10 MED ORDER — LIDOCAINE-EPINEPHRINE (PF) 2 %-1:200000 IJ SOLN
INTRAMUSCULAR | Status: AC
  Filled 2015-04-10: qty 20

## 2015-04-10 MED ORDER — LORAZEPAM 2 MG/ML IJ SOLN: 1.0000 mg | INTRAMUSCULAR | Status: DC | PRN

## 2015-04-10 NOTE — ED Notes (Signed)
Notified EDP,Goldston,MD., pt. i-stat Lactic acid 3.96.

## 2015-04-10 NOTE — Progress Notes (Signed)
ANTIBIOTIC CONSULT NOTE - INITIAL  Pharmacy Consult for Vancomycin & Ceftazidime Indication: pneumonia  No Known Allergies  Patient Measurements:   Total body weight: 72 kg   Vital Signs: Temp: 97.6 F (36.4 C) (11/12 1702) Temp Source: Oral (11/12 1702) BP: 127/85 mmHg (11/12 1702) Intake/Output from previous day:   Intake/Output from this shift:    Labs: No results for input(s): WBC, HGB, PLT, LABCREA, CREATININE in the last 72 hours. Estimated Creatinine Clearance: 107.6 mL/min (by C-G formula based on Cr of 0.71). No results for input(s): VANCOTROUGH, VANCOPEAK, VANCORANDOM, GENTTROUGH, GENTPEAK, GENTRANDOM, TOBRATROUGH, TOBRAPEAK, TOBRARND, AMIKACINPEAK, AMIKACINTROU, AMIKACIN in the last 72 hours.   Microbiology: Recent Results (from the past 720 hour(s))  Urine culture     Status: None   Collection Time: 03/12/15  5:39 PM  Result Value Ref Range Status   Specimen Description URINE, CLEAN CATCH  Final   Special Requests NONE  Final   Culture   Final    NO GROWTH 1 DAY Performed at [facility]    Report Status 03/13/2015 FINAL  Final  Culture, body fluid-bottle     Status: None   Collection Time: 03/13/15  9:25 AM  Result Value Ref Range Status   Specimen Description FLUID  Final   Special Requests NONE  Final   Culture   Final    NO GROWTH 5 DAYS Performed at [facility]    Report Status 03/18/2015 FINAL  Final  Gram stain     Status: None   Collection Time: 03/13/15  9:25 AM  Result Value Ref Range Status   Specimen Description FLUID  Final   Special Requests NONE  Final   Gram Stain   Final    WBC PRESENT,BOTH PMN AND MONONUCLEAR NO ORGANISMS SEEN CONFIRMED BY K.WOOTEN Performed at [facility]    Report Status 03/13/2015 FINAL  Final  Culture, body fluid-bottle     Status: None   Collection Time: 03/28/15 10:41 AM  Result Value Ref Range Status   Specimen Description FLUID PERITONEAL  Final   Special Requests BAA 10CCS   Final   Culture NO GROWTH 5 DAYS  Final   Report Status 04/02/2015 FINAL  Final  Gram stain     Status: None   Collection Time: 03/28/15 10:41 AM  Result Value Ref Range Status   Specimen Description FLUID PERITONEAL  Final   Special Requests NONE  Final   Gram Stain   Final    FEW WBC PRESENT,BOTH PMN AND MONONUCLEAR NO ORGANISMS SEEN    Report Status 03/28/2015 FINAL  Final   Medical History: Past Medical History  Diagnosis Date  . ETOH abuse    Medications:  Scheduled:  . fentaNYL (SUBLIMAZE) injection  100 mcg Intravenous Once  . lidocaine-EPINEPHrine  10 mL Other Once   Anti-infectives    Start     Dose/Rate Route Frequency Ordered Stop   04/11/15 0400  vancomycin (VANCOCIN) IVPB 1000 mg/200 mL premix     1,000 mg 200 mL/hr over 60 Minutes Intravenous Every 8 hours 04/10/15 1855     04/11/15 0200  cefTAZidime (FORTAZ) 1 g in dextrose 5 % 50 mL IVPB     1 g 100 mL/hr over 30 Minutes Intravenous Every 8 hours 04/10/15 1855     04/10/15 1930  vancomycin (VANCOCIN) 1,500 mg in sodium chloride 0.9 % 500 mL IVPB     1,500 mg 250 mL/hr over 120 Minutes Intravenous  Once 04/10/15 1853  04/10/15 1900  cefTAZidime (FORTAZ) 1 g in dextrose 5 % 50 mL IVPB     1 g 100 mL/hr over 30 Minutes Intravenous  Once 04/10/15 1852       Assessment: [age] yoM with chronic ascites from ETOH abuse admitted for abdominal pain, recent admit 10/30-31 with paracentesis (4.6L), returned 11/1 to ED with complaint of fluid leak from site. Previous paracentesis 03/13/15 removed 2.5L. CXray with LLL infiltrate, abx ordered for PNA and SBP.  Begin Vancomycin & Zosyn, pharmacy to dose.  Goal of Therapy:  Vancomycin trough level 15-20 mcg/ml  Plan:   Zosyn 3.375gm q8hr - 4 hr infusion  Vancomycin 1500 mg IV x1, then 1gm q8hr  Vancomycin trough at steady state, follow cultures, clinical course  Otho BellowsGreen, Nayzeth Altman L PharmD Pager [phone] 04/10/2015, 7:36 PM

## 2015-04-10 NOTE — ED Notes (Signed)
2 Unsuccessful attempts @ Drawing labs. Received flash, but nothing else. Rn aware

## 2015-04-10 NOTE — H&P (Addendum)
History and Physical  Hernan Turnage ZOX:[id number] DOB: Oct 15, 1960 DOA: 04/10/2015  PCP: No primary care provider on file.   Chief Complaint: abdominal distention, dyspnea.   History of Present Illness:  - Patient is a 54 yo male with history of alcoholic cirrhosis with recent admission for paracentesis who was brought by EMS due to worsening abdominal distention and dyspnea.  - he said abdominal distention has been worsening since last paracentesis 3 weeks ago ( which was the first one) when he was diagnosed with alcoholic cirrhosis. He has had nausea and vomiting daily ( non bloody) and watery diarrhea. He kept drinking 4-6 beers /day.  He denied fever but had chills. - He has had worsening dyspnea with cough productive of whitish sputum with no chest pain. In review of system he has "drippling" after urination with dysuria. No hematuria. He has had occasional dizziness. He had occasional tremors and has felt confused sometimes. - he has not taken any of his medications as he could not afford them.   Review of Systems:  CONSTITUTIONAL:  No night sweats.  No fatigue, malaise, lethargy.  No fever .+chills. Eyes:  No visual changes.  No eye pain.  No eye discharge.   ENT:    No epistaxis.  No sinus pain.  No sore throat.  No ear pain.  No congestion. RESPIRATORY:  +cough.  No wheeze.  No hemoptysis.  +shortness of breath. CARDIOVASCULAR:  No chest pains.  No palpitations. GASTROINTESTINAL:  +abdominal pain.  +nausea +vomiting.  +diarrhea . No constipation.  No hematemesis.  No hematochezia.  No melena. GENITOURINARY:  No urgency.  No frequency.  +dysuria.  No hematuria.  +obstructive symptoms.  No discharge.  No pain.  No significant abnormal bleeding. MUSCULOSKELETAL:  No musculoskeletal pain.  No joint swelling.  No arthritis. NEUROLOGICAL:  +confusion.  +weakness. No headache. No seizure. PSYCHIATRIC:  No depression. No anxiety. No suicidal ideation. SKIN:  No rashes.  No  lesions.  No wounds. ENDOCRINE:  No unexplained weight loss.  No polydipsia.  No polyuria.  No polyphagia. HEMATOLOGIC:  No anemia.  No purpura.  No petechiae.  No bleeding.  ALLERGIC AND IMMUNOLOGIC:  No pruritus.  No swelling Other:  Past Medical and Surgical History:   Past Medical History  Diagnosis Date  . ETOH abuse    Past Surgical History  Procedure Laterality Date  . No past surgeries      jaw surgery 73yrs ago    Social History:   reports that he has been smoking Cigarettes.  He has a 40 pack-year smoking history. He has never used smokeless tobacco. He reports that he drinks about 4.8 oz of alcohol per week. He reports that he does not use illicit drugs.   No Known Allergies  Family History  Problem Relation Age of Onset  . Diabetes Mother     Prior to Admission medications   Medication Sig Start Date End Date Taking? Authorizing Provider  clindamycin (CLEOCIN) 150 MG capsule Take 2 capsules (300 mg total) by mouth 4 (four) times daily. 03/30/15   Laurence Spates, MD  folic acid (FOLVITE) 1 MG tablet Take 1 tablet (1 mg total) by mouth daily. 03/29/15   Ripudeep Jenna Luo, MD  LORazepam (ATIVAN) 0.5 MG tablet Take ativan 0.5mg  twice a day for 2 days, then 0.5mg  once a day for 2 days, then as needed for anxiety/shakiness 03/29/15   Ripudeep K Rai, MD  nicotine (NICODERM CQ - DOSED IN MG/24 HOURS) 21 mg/24hr  patch Place 1 patch (21 mg total) onto the skin daily. 03/29/15   Ripudeep Jenna LuoK Rai, MD  pantoprazole (PROTONIX) 40 MG tablet Take 1 tablet (40 mg total) by mouth daily. 03/29/15   Ripudeep Jenna LuoK Rai, MD  spironolactone (ALDACTONE) 50 MG tablet Take 1 tablet (50 mg total) by mouth daily. 03/29/15   Ripudeep Jenna LuoK Rai, MD  thiamine 100 MG tablet Take 1 tablet (100 mg total) by mouth daily. 03/29/15   Ripudeep Jenna LuoK Rai, MD    Physical Exam: BP 127/83 mmHg  Pulse 91  Temp(Src) 97.7 F (36.5 C) (Oral)  Resp 20  SpO2 99%  GENERAL : Well developed, well nourished, alert and  cooperative, and appears to be in no acute distress. HEAD: normocephalic. EYES: PERRL, EOMI. EARS:  hearing grossly intact. NOSE: No nasal discharge. THROAT: Oral cavity and pharynx normal. NECK: Neck supple CARDIAC: Normal S1 and S2. No S3, S4 or murmurs. Rhythm is regular. There is mild peripheral edema. LUNGS: CTAB ABDOMEN: Positive bowel sounds. Soft, distended, diffusely tender. No guarding or rebound. No masses. EXTREMITIES: No significant deformity or joint abnormality.  NEUROLOGICAL: The mental examination revealed the patient was oriented to person, place, and time.CN II-XII intact.  SKIN: Skin normal color, texture and turgor with no lesions or eruptions.           Labs on Admission:  Reviewed.   Radiological Exams on Admission: Dg Chest Portable 1 View  04/10/2015  CLINICAL DATA:  Dyspnea. pt from home/hotel. Pt with ascites d/t chronic alcohol use. Drank today. Abdomen distended. Abdominal pain. Smoker. EXAM: PORTABLE CHEST 1 VIEW COMPARISON:  03/28/2015 FINDINGS: Heart size is normal. There is mild prominence of interstitial markings and perihilar bronchitic change. More focal opacity at the left lung base appears new and likely represents acute infectious process. Consider aspiration pneumonia. No pulmonary edema. IMPRESSION: 1. Bronchitic changes. 2. Left lower lobe infiltrate. Electronically Signed   By: Norva PavlovElizabeth  Brown M.D.   On: 04/10/2015 18:33    Assessment/Plan  HCAP:  Bcx sent, started on vanc/zyosyn  Ascites due to alcoholic cirrhosis:  S/p 1 L removal by ED: sent for cell count and albumin On Zosyn : should cover for a possible SBP Morphine prn pain, zofran prn N/V, PPI IV daily Given 1.5 L in the ER. Hold on diuresis since sat is> 98 on RA. Will need to start Aldactone/lasix in am if stable.  Replace K and monitor LA 3.9 will repeat in am. Suspect due to liver disease not sepsis : stable BP.  Counseled to stop drinking.  Need CM consult for  insurance/help with medication Consult to IR in am for fluid drainage F/u fluid cell count/albumin   DVT prophylaxis:   enoxaparin  GI prophylaxis: PPI Code Status: full     Eston EstersAhmad Bailyn Spackman M.D [facility]

## 2015-04-10 NOTE — ED Notes (Signed)
Spoke with Susie in Lab  ,  Send down additional orders via tube systems

## 2015-04-10 NOTE — ED Provider Notes (Signed)
CSN: [id number]     Arrival date & time 04/10/15  1653 History   First MD Initiated Contact with Patient 04/10/15 1721     Chief Complaint  Patient presents with  . Ascites     (Consider location/radiation/quality/duration/timing/severity/associated sxs/prior Treatment) HPI  54 year old male with a history of alcohol abuse and cirrhosis presents with recurrent ascites. States over last 4-5 days he's been having a worsening distention of abdomen and abdominal pain. Abdominal pain is generalized. Denies any fevers. Has been having mild intermittent vomiting, most recently this morning. Patient's last paracentesis was about 2 weeks ago. States he couldn't take the pain anymore so he came in here. Has been having some dyspnea that he is not sure if it is related to his COPD or the abdominal distention.   Past Medical History  Diagnosis Date  . ETOH abuse    Past Surgical History  Procedure Laterality Date  . No past surgeries      jaw surgery 50yrs ago   Family History  Problem Relation Age of Onset  . Diabetes Mother    Social History  Substance Use Topics  . Smoking status: Current Every Day Smoker -- 1.00 packs/day for 40 years    Types: Cigarettes  . Smokeless tobacco: Never Used  . Alcohol Use: 4.8 oz/week    8 Cans of beer per week     Comment: drinks daily-drinks whatever is available    Review of Systems  Constitutional: Negative for fever.  Respiratory: Positive for shortness of breath.   Cardiovascular: Negative for chest pain.  Gastrointestinal: Positive for vomiting, abdominal pain and abdominal distention.  All other systems reviewed and are negative.     Allergies  Review of patient's allergies indicates no known allergies.  Home Medications   Prior to Admission medications   Medication Sig Start Date End Date Taking? Authorizing Provider  clindamycin (CLEOCIN) 150 MG capsule Take 2 capsules (300 mg total) by mouth 4 (four) times daily. 03/30/15    Laurence Spates, MD  folic acid (FOLVITE) 1 MG tablet Take 1 tablet (1 mg total) by mouth daily. 03/29/15   Ripudeep Jenna Luo, MD  LORazepam (ATIVAN) 0.5 MG tablet Take ativan 0.5mg  twice a day for 2 days, then 0.5mg  once a day for 2 days, then as needed for anxiety/shakiness 03/29/15   Ripudeep K Rai, MD  nicotine (NICODERM CQ - DOSED IN MG/24 HOURS) 21 mg/24hr patch Place 1 patch (21 mg total) onto the skin daily. 03/29/15   Ripudeep Jenna Luo, MD  pantoprazole (PROTONIX) 40 MG tablet Take 1 tablet (40 mg total) by mouth daily. 03/29/15   Ripudeep Jenna Luo, MD  spironolactone (ALDACTONE) 50 MG tablet Take 1 tablet (50 mg total) by mouth daily. 03/29/15   Ripudeep Jenna Luo, MD  thiamine 100 MG tablet Take 1 tablet (100 mg total) by mouth daily. 03/29/15   Ripudeep K Rai, MD   BP 127/85 mmHg  Temp(Src) 97.6 F (36.4 C) (Oral)  Resp 22  SpO2 96% Physical Exam  Constitutional: He is oriented to person, place, and time. He appears well-developed and well-nourished.  HENT:  Head: Normocephalic and atraumatic.  Right Ear: External ear normal.  Left Ear: External ear normal.  Nose: Nose normal.  Eyes: Right eye exhibits no discharge. Left eye exhibits no discharge.  Neck: Neck supple.  Cardiovascular: Normal rate, regular rhythm, normal heart sounds and intact distal pulses.   Pulmonary/Chest: Effort normal. He has wheezes (mild expiratory wheezes).  Abdominal: Soft.  He exhibits distension. There is tenderness (generalized). There is no rebound and no guarding.  Musculoskeletal: He exhibits no edema.  Neurological: He is alert and oriented to person, place, and time.  Skin: Skin is warm and dry. He is not diaphoretic.  Nursing note and vitals reviewed.   ED Course  .Paracentesis Date/Time: 04/11/2015 12:01 AM Performed by: Pricilla Loveless Authorized by: Pricilla Loveless Consent: Verbal consent obtained. Written consent obtained. Risks and benefits: risks, benefits and alternatives were  discussed Consent given by: patient Patient identity confirmed: verbally with patient Time out: Immediately prior to procedure a "time out" was called to verify the correct patient, procedure, equipment, support staff and site/side marked as required. Initial or subsequent exam: initial Procedure purpose: diagnostic and therapeutic Indications: abdominal discomfort secondary to ascites Anesthesia: local infiltration Local anesthetic: lidocaine 1% without epinephrine Patient sedated: no Preparation: Patient was prepped and draped in the usual sterile fashion. Ultrasound guidance: yes Puncture site: right lower quadrant Fluid appearance: serous Dressing: 4x4 sterile gauze Patient tolerance: Patient tolerated the procedure well with no immediate complications Comments: Only about 1L about to be removed   (including critical care time) Labs Review Labs Reviewed  CBC WITH DIFFERENTIAL/PLATELET - Abnormal; Notable for the following:    MCV 101.2 (*)    MCH 36.0 (*)    Platelets 125 (*)    All other components within normal limits  COMPREHENSIVE METABOLIC PANEL - Abnormal; Notable for the following:    Sodium 129 (*)    Potassium 3.3 (*)    Chloride 94 (*)    Glucose, Bld 111 (*)    BUN <5 (*)    Creatinine, Ser 0.55 (*)    Calcium 8.2 (*)    Albumin 2.6 (*)    AST 109 (*)    Alkaline Phosphatase 178 (*)    Total Bilirubin 2.5 (*)    All other components within normal limits  LIPASE, BLOOD - Abnormal; Notable for the following:    Lipase 84 (*)    All other components within normal limits  BODY FLUID CELL COUNT WITH DIFFERENTIAL - Abnormal; Notable for the following:    Monocyte-Macrophage-Serous Fluid 48 (*)    All other components within normal limits  PROTIME-INR - Abnormal; Notable for the following:    Prothrombin Time 16.3 (*)    All other components within normal limits  I-STAT CG4 LACTIC ACID, ED - Abnormal; Notable for the following:    Lactic Acid, Venous 3.96 (*)     All other components within normal limits  BODY FLUID CULTURE  CULTURE, BLOOD (ROUTINE X 2)  CULTURE, BLOOD (ROUTINE X 2)  BODY FLUID CULTURE  LACTATE DEHYDROGENASE, BODY FLUID  GLUCOSE, PERITONEAL FLUID  PROTEIN, BODY FLUID  ALBUMIN, FLUID  PATHOLOGIST SMEAR REVIEW  COMPREHENSIVE METABOLIC PANEL  CBC WITH DIFFERENTIAL/PLATELET  URINALYSIS, ROUTINE W REFLEX MICROSCOPIC (NOT AT [facility])  I-STAT CG4 LACTIC ACID, ED    Imaging Review Dg Chest Portable 1 View  04/10/2015  CLINICAL DATA:  Dyspnea. pt from home/hotel. Pt with ascites d/t chronic alcohol use. Drank today. Abdomen distended. Abdominal pain. Smoker. EXAM: PORTABLE CHEST 1 VIEW COMPARISON:  03/28/2015 FINDINGS: Heart size is normal. There is mild prominence of interstitial markings and perihilar bronchitic change. More focal opacity at the left lung base appears new and likely represents acute infectious process. Consider aspiration pneumonia. No pulmonary edema. IMPRESSION: 1. Bronchitic changes. 2. Left lower lobe infiltrate. Electronically Signed   By: Norva Pavlov M.D.   On: 04/10/2015  18:33   I have personally reviewed and evaluated these images and lab results as part of my medical decision-making.   EKG Interpretation None      MDM   HCAP Abdominal Ascites  Patient's dyspnea is most likely related to the hospital acquired pneumonia seen on his chest x-ray. He has not hypoxic but does have evidence of a lactic acid of 3.96. This is likely contribute by his liver disease. He will be treated with broad antibiotics, including Zosyn that will help cover possible peritonitis. Paracentesis performed as above. Fluid appears normal and cell count is not consistent with SBP. I was only able to remove about 1 L of fluid and he might need more relieved while he is in the hospital. Admit to step down.   Pricilla LovelessScott Avyon Herendeen, MD 04/11/15 [phone]

## 2015-04-10 NOTE — ED Notes (Signed)
Dr Criss AlvineGoldston completed drawing off fluid of abdominal cavity,  Sample walked to lab by Geanie Cooleyarmen NT,  Jari Favrescar RN at bedside with Dr Criss AlvineGoldston as he drew fluid off ,

## 2015-04-10 NOTE — ED Notes (Signed)
Per EMS, pt from home/hotel.  Pt with ascites d/t chronic alcohol use. Drank today.  Abdomen distended.  Abdominal pain 10/10.  Vitals 149/115, hr 109, 99% ra cbg 125

## 2015-04-10 NOTE — ED Notes (Signed)
Blood work is now completed,  Second IV started,  Lab drew remaining two sets of blood cultures,  Comfort measures given to pt ,, warm blanket and socks placed on feet,  Pillow given behind head and 2 L oxygen  due to O2 dropping after receiving pain medication

## 2015-04-10 NOTE — ED Notes (Signed)
Pt's bed adjusted per pt request for comfort

## 2015-04-10 NOTE — ED Notes (Signed)
Bed: WA04 Expected date:  Expected time:  Means of arrival:  Comments: Abd pain ascites

## 2015-04-11 LAB — CBC WITH DIFFERENTIAL/PLATELET
BASOS PCT: 1 %
Basophils Absolute: 0.1 10*3/uL (ref 0.0–0.1)
EOS ABS: 0.1 10*3/uL (ref 0.0–0.7)
EOS PCT: 1 %
HCT: 37.1 % — ABNORMAL LOW (ref 39.0–52.0)
Hemoglobin: 12.7 g/dL — ABNORMAL LOW (ref 13.0–17.0)
LYMPHS ABS: 2 10*3/uL (ref 0.7–4.0)
Lymphocytes Relative: 24 %
MCH: 35.1 pg — AB (ref 26.0–34.0)
MCHC: 34.2 g/dL (ref 30.0–36.0)
MCV: 102.5 fL — ABNORMAL HIGH (ref 78.0–100.0)
MONOS PCT: 11 %
Monocytes Absolute: 0.9 10*3/uL (ref 0.1–1.0)
NEUTROS ABS: 5.1 10*3/uL (ref 1.7–7.7)
NEUTROS PCT: 63 %
PLATELETS: 102 10*3/uL — AB (ref 150–400)
RBC: 3.62 MIL/uL — ABNORMAL LOW (ref 4.22–5.81)
RDW: 13.1 % (ref 11.5–15.5)
WBC: 8.1 10*3/uL (ref 4.0–10.5)

## 2015-04-11 LAB — COMPREHENSIVE METABOLIC PANEL
ALK PHOS: 143 U/L — AB (ref 38–126)
ALT: 33 U/L (ref 17–63)
ANION GAP: 6 (ref 5–15)
AST: 80 U/L — ABNORMAL HIGH (ref 15–41)
Albumin: 2.2 g/dL — ABNORMAL LOW (ref 3.5–5.0)
BUN: <5 mg/dL — ABNORMAL LOW (ref 6–20)
CALCIUM: 7.7 mg/dL — AB (ref 8.9–10.3)
CHLORIDE: 101 mmol/L (ref 101–111)
CO2: 23 mmol/L (ref 22–32)
Creatinine, Ser: 0.45 mg/dL — ABNORMAL LOW (ref 0.61–1.24)
Glucose, Bld: 131 mg/dL — ABNORMAL HIGH (ref 65–99)
Potassium: 3.5 mmol/L (ref 3.5–5.1)
SODIUM: 130 mmol/L — AB (ref 135–145)
Total Bilirubin: 2 mg/dL — ABNORMAL HIGH (ref 0.3–1.2)
Total Protein: 5.4 g/dL — ABNORMAL LOW (ref 6.5–8.1)

## 2015-04-11 LAB — GRAM STAIN

## 2015-04-11 LAB — URINE MICROSCOPIC-ADD ON

## 2015-04-11 LAB — ALBUMIN, FLUID (OTHER)

## 2015-04-11 LAB — LACTATE DEHYDROGENASE, PLEURAL OR PERITONEAL FLUID: LD FL: 23 U/L (ref 3–23)

## 2015-04-11 LAB — URINALYSIS, ROUTINE W REFLEX MICROSCOPIC
BILIRUBIN URINE: NEGATIVE
GLUCOSE, UA: NEGATIVE mg/dL
KETONES UR: NEGATIVE mg/dL
Leukocytes, UA: NEGATIVE
Nitrite: NEGATIVE
PROTEIN: NEGATIVE mg/dL
Specific Gravity, Urine: 1.013 (ref 1.005–1.030)
UROBILINOGEN UA: 0.2 mg/dL (ref 0.0–1.0)
pH: 6 (ref 5.0–8.0)

## 2015-04-11 LAB — PROTEIN, BODY FLUID

## 2015-04-11 LAB — GLUCOSE, PERITONEAL FLUID: GLUCOSE, PERITONEAL FLUID: 121 mg/dL

## 2015-04-11 MED ORDER — MORPHINE SULFATE (PF) 2 MG/ML IV SOLN: 1.0000 mg | Freq: Once | INTRAVENOUS | Status: DC

## 2015-04-11 MED ORDER — KETOROLAC TROMETHAMINE 30 MG/ML IJ SOLN: 30.0000 mg | Freq: Once | INTRAMUSCULAR | Status: DC

## 2015-04-11 MED ORDER — MORPHINE SULFATE (PF) 2 MG/ML IV SOLN
2.0000 mg | INTRAVENOUS | Status: DC | PRN
  Administered 2015-04-11 – 2015-04-12 (×5): 2 mg via INTRAVENOUS
  Filled 2015-04-11 (×5): qty 1

## 2015-04-11 MED ORDER — ONDANSETRON HCL 4 MG/2ML IJ SOLN: 4.0000 mg | Freq: Once | INTRAMUSCULAR | Status: DC

## 2015-04-11 MED ORDER — DIPHENHYDRAMINE HCL 50 MG/ML IJ SOLN: 12.5000 mg | Freq: Once | INTRAMUSCULAR | Status: DC

## 2015-04-11 NOTE — Progress Notes (Signed)
[facility] PROGRESS NOTE  [name] ZOX:[id number] DOB: 1961-05-15 DOA: 04/10/2015 PCP: No primary care provider on file.  Assessment/Plan: Principal Problem:   HCAP (healthcare-associated pneumonia) - Currently on Vancomycin and Zosyn - chest x ray reports left lower lobe infiltrate.  Active Problems: Alcoholic cirrhosis of liver with ascities - pt is s/p paracentesis - no organisms seen on gram stain. Will f/u with results  Alcohol abuse - stable no anxiety on evaluation   Code Status: full Family Communication:  None at bedside Disposition Plan: pending improvement in condition.   Consultants:  None  Procedures:  paracentesis  Antibiotics:  Zosyn  Vancomycin  HPI/Subjective: Pt has no new complaints. No acute issues overnight. Denies any anxiety  Objective: Filed Vitals:   04/11/15 0542  BP: 115/70  Pulse: 95  Temp: 97.6 F (36.4 C)  Resp: 18    Intake/Output Summary (Last 24 hours) at 04/11/15 1353 Last data filed at 04/11/15 1159  Gross per 24 hour  Intake    980 ml  Output    325 ml  Net    655 ml   Filed Weights   04/10/15 2305  Weight: 84.7 kg (186 lb 11.7 oz)    Exam:   General:  Pt in nad, alert and awake  Cardiovascular: rrr, no mrg  Respiratory: cta bl, no wheezes  Abdomen: soft, distended, non tender  Musculoskeletal: no cyanosis or clubbing   Data Reviewed: Basic Metabolic Panel:  Recent Labs Lab 04/10/15 1930 04/11/15 0532  NA 129* 130*  K 3.3* 3.5  CL 94* 101  CO2 25 23  GLUCOSE 111* 131*  BUN <5* <5*  CREATININE 0.55* 0.45*  CALCIUM 8.2* 7.7*   Liver Function Tests:  Recent Labs Lab 04/10/15 1930 04/11/15 0532  AST 109* 80*  ALT 41 33  ALKPHOS 178* 143*  BILITOT 2.5* 2.0*  PROT 6.7 5.4*  ALBUMIN 2.6* 2.2*    Recent Labs Lab 04/10/15 1930  LIPASE 84*   No results for input(s): AMMONIA in the last 168 hours. CBC:  Recent Labs Lab 04/10/15 1930 04/11/15 0532  WBC 10.0 8.1   NEUTROABS 7.0 5.1  HGB 15.3 12.7*  HCT 43.0 37.1*  MCV 101.2* 102.5*  PLT 125* 102*   Cardiac Enzymes: No results for input(s): CKTOTAL, CKMB, CKMBINDEX, TROPONINI in the last 168 hours. BNP (last 3 results) No results for input(s): BNP in the last 8760 hours.  ProBNP (last 3 results) No results for input(s): PROBNP in the last 8760 hours.  CBG: No results for input(s): GLUCAP in the last 168 hours.  Recent Results (from the past 240 hour(s))  Body fluid culture     Status: None (Preliminary result)   Collection Time: 04/10/15  9:21 PM  Result Value Ref Range Status   Specimen Description ASCITIC  Final   Special Requests NONE  Final   Gram Stain   Final    FEW WBC PRESENT, PREDOMINANTLY MONONUCLEAR NO ORGANISMS SEEN Performed at [facility]    Culture PENDING  Incomplete   Report Status PENDING  Incomplete  Culture, body fluid-bottle     Status: None (Preliminary result)   Collection Time: 04/10/15  9:29 PM  Result Value Ref Range Status   Specimen Description PARACENTESIS Performed at [facility]   Final   Special Requests NONE  Final   Culture PENDING  Incomplete   Report Status PENDING  Incomplete  Gram stain     Status: None   Collection Time: 04/10/15  9:29 PM  Result Value Ref Range Status   Specimen Description PARACENTESIS  Final   Special Requests NONE  Final   Gram Stain   Final    RARE WBC PRESENT, PREDOMINANTLY MONONUCLEAR NO ORGANISMS SEEN [facility]formed [name]    Report Status 04/11/2015 FINAL  Final     Studies: Dg Chest Portable 1 View  04/10/2015  CLINICAL DATA:  Dyspnea. pt from home/hotel. Pt with ascites d/t chronic alcohol use. Drank today. Abdomen distended. Abdominal pain. Smoker. EXAM: PORTABLE CHEST 1 VIEW COMPARISON:  03/28/2015 FINDINGS: Heart size is normal. There is mild prominence of interstitial markings and perihilar bronchitic change. More focal opacity at the left lung base appears new and likely  represents acute infectious process. Consider aspiration pneumonia. No pulmonary edema. IMPRESSION: 1. Bronchitic changes. 2. Left lower lobe infiltrate. Electronically Signed   By: Norva PavlovElizabeth  [name] M.D.   On: 04/10/2015 18:33    Scheduled Meds: . enoxaparin (LOVENOX) injection  40 mg Subcutaneous QHS  . lidocaine-EPINEPHrine  10 mL Other Once  .  morphine injection  1 mg Intravenous Once  . pantoprazole (PROTONIX) IV  40 mg Intravenous Q24H  . piperacillin-tazobactam (ZOSYN)  IV  3.375 g Intravenous Q8H  . thiamine  100 mg Oral Daily  . vancomycin  1,000 mg Intravenous Q8H   Continuous Infusions:   Time spent: > 35 minutes  [name], [name]  [facility] Pager [phone] If 7PM-7AM, please contact night-coverage at [email or web address], password [email or web address] 04/11/2015, 1:53 PM  LOS: 1 day

## 2015-04-11 NOTE — Progress Notes (Signed)
Patient C/O of bil arm cramps, PRN morphine given and Dr. Cena BentonVega notified, no new order given. Will continue to assess patient.

## 2015-04-12 LAB — PATHOLOGIST SMEAR REVIEW

## 2015-04-12 MED ORDER — AMOXICILLIN-POT CLAVULANATE ER 1000-62.5 MG PO TB12
2.0000 | ORAL_TABLET | Freq: Two times a day (BID) | ORAL | Status: DC
  Administered 2015-04-12 – 2015-04-15 (×6): 2 via ORAL
  Filled 2015-04-12 (×6): qty 2

## 2015-04-12 MED ORDER — OXYCODONE HCL 5 MG PO TABS
5.0000 mg | ORAL_TABLET | Freq: Four times a day (QID) | ORAL | Status: DC | PRN
  Administered 2015-04-12 – 2015-04-15 (×3): 5 mg via ORAL
  Filled 2015-04-12 (×3): qty 1

## 2015-04-12 NOTE — Care Management Note (Signed)
Case Management Note  Patient Details  Name: [name] MRN: [id number] Date of Birth: December 03, 1960  Subjective/Objective:   54 y/o m admitted w/etoh cirrhosis.s/p paracentesis. From home.CSW following.                Action/Plan:d/c plan home.   Expected Discharge Date:                  Expected Discharge Plan:  Home/Self Care  In-House Referral:  Clinical Social Work  Discharge planning Services  CM Consult  Post Acute Care Choice:    Choice offered to:     DME Arranged:    DME Agency:     HH Arranged:    HH Agency:     Status of Service:  In process, will continue to follow  Medicare Important Message Given:    Date Medicare IM Given:    Medicare IM give by:    Date Additional Medicare IM Given:    Additional Medicare Important Message give by:     If discussed at Long Length of Stay Meetings, dates discussed:    Additional Comments:  Lanier ClamMahabir, Daylen Hack, RN 04/12/2015, 9:38 PM

## 2015-04-12 NOTE — Progress Notes (Signed)
[facility] PROGRESS NOTE  Bryce MawRobert [name] BJY:[id number] DOB: [birth year] DOA: 04/10/2015 PCP: No primary care provider on file.  Assessment/Plan: Principal Problem:   HCAP (healthcare-associated pneumonia) - Will transition to augmentin - chest x ray reports left lower lobe infiltrate.  Active Problems: Alcoholic cirrhosis of liver with ascities - pt is s/p paracentesis - no organisms seen on gram stain. No growth x 1 day on culture and no increase in WBC in peritoneal fluid. As such do not suspect patient has active intra abdominal infection.  Alcohol abuse - stable no anxiety on evaluation   Code Status: full Family Communication:  None at bedside Disposition Plan: pending improvement in condition.   Consultants:  None  Procedures:  paracentesis  Antibiotics:  Zosyn and Vancomycin>>> Augmentin 04/12/15  HPI/Subjective: Pt has no new complaints. No acute issues overnight. Denies any anxiety  Objective: Filed Vitals:   04/12/15 1454  BP: 101/71  Pulse: 96  Temp: 97.7 F (36.5 C)  Resp: 16    Intake/Output Summary (Last 24 hours) at 04/12/15 1613 Last data filed at 04/12/15 1300  Gross per 24 hour  Intake   1430 ml  Output      0 ml  Net   1430 ml   Filed Weights   04/10/15 2305  Weight: 84.7 kg (186 lb 11.7 oz)    Exam:   General:  Pt in nad, alert and awake  Cardiovascular: rrr, no mrg  Respiratory: cta bl, no wheezes  Abdomen: soft, distended, non tender  Musculoskeletal: no cyanosis or clubbing   Data Reviewed: Basic Metabolic Panel:  Recent Labs Lab 04/10/15 1930 04/11/15 0532  NA 129* 130*  K 3.3* 3.5  CL 94* 101  CO2 25 23  GLUCOSE 111* 131*  BUN <5* <5*  CREATININE 0.55* 0.45*  CALCIUM 8.2* 7.7*   Liver Function Tests:  Recent Labs Lab 04/10/15 1930 04/11/15 0532  AST 109* 80*  ALT 41 33  ALKPHOS 178* 143*  BILITOT 2.5* 2.0*  PROT 6.7 5.4*  ALBUMIN 2.6* 2.2*    Recent Labs Lab 04/10/15 1930  LIPASE  84*   No results for input(s): AMMONIA in the last 168 hours. CBC:  Recent Labs Lab 04/10/15 1930 04/11/15 0532  WBC 10.0 8.1  NEUTROABS 7.0 5.1  HGB 15.3 12.7*  HCT 43.0 37.1*  MCV 101.2* 102.5*  PLT 125* 102*   Cardiac Enzymes: No results for input(s): CKTOTAL, CKMB, CKMBINDEX, TROPONINI in the last 168 hours. BNP (last 3 results) No results for input(s): BNP in the last 8760 hours.  ProBNP (last 3 results) No results for input(s): PROBNP in the last 8760 hours.  CBG: No results for input(s): GLUCAP in the last 168 hours.  Recent Results (from the past 240 hour(s))  Blood culture (routine x 2)     Status: None (Preliminary result)   Collection Time: 04/10/15  7:50 PM  Result Value Ref Range Status   Specimen Description BLOOD RIGHT HAND  Final   Special Requests BOTTLES DRAWN AEROBIC ONLY 5CC  Final   Culture   Final    NO GROWTH 1 DAY Performed at [facility]    Report Status PENDING  Incomplete  Blood culture (routine x 2)     Status: None (Preliminary result)   Collection Time: 04/10/15  7:55 PM  Result Value Ref Range Status   Specimen Description BLOOD RIGHT ARM  Final   Special Requests BOTTLES DRAWN AEROBIC AND ANAEROBIC 10CC  Final   Culture  Final    NO GROWTH 1 DAY Performed at [facility]    Report Status PENDING  Incomplete  Body fluid culture     Status: None (Preliminary result)   Collection Time: 04/10/15  9:21 PM  Result Value Ref Range Status   Specimen Description ASCITIC  Final   Special Requests NONE  Final   Gram Stain   Final    FEW WBC PRESENT, PREDOMINANTLY MONONUCLEAR NO ORGANISMS SEEN    Culture   Final    NO GROWTH 1 DAY Performed at [facility]    Report Status PENDING  Incomplete  Culture, body fluid-bottle     Status: None (Preliminary result)   Collection Time: 04/10/15  9:29 PM  Result Value Ref Range Status   Specimen Description PARACENTESIS  Final   Special Requests NONE  Final   Culture    Final    NO GROWTH 1 DAY Performed at [facility]    Report Status PENDING  Incomplete  Gram stain     Status: None   Collection Time: 04/10/15  9:29 PM  Result Value Ref Range Status   Specimen Description PARACENTESIS  Final   Special Requests NONE  Final   Gram Stain   Final    RARE WBC PRESENT, PREDOMINANTLY MONONUCLEAR NO ORGANISMS SEEN Performed at [facility]    Report Status 04/11/2015 FINAL  Final     Studies: Dg Chest Portable 1 View  04/10/2015  CLINICAL DATA:  Dyspnea. pt from home/hotel. Pt with ascites d/t chronic alcohol use. Drank today. Abdomen distended. Abdominal pain. Smoker. EXAM: PORTABLE CHEST 1 VIEW COMPARISON:  03/28/2015 FINDINGS: Heart size is normal. There is mild prominence of interstitial markings and perihilar bronchitic change. More focal opacity at the left lung base appears new and likely represents acute infectious process. Consider aspiration pneumonia. No pulmonary edema. IMPRESSION: 1. Bronchitic changes. 2. Left lower lobe infiltrate. Electronically Signed   By: Norva Pavlov M.D.   On: 04/10/2015 18:33    Scheduled Meds: . amoxicillin-clavulanate  2 tablet Oral Q12H  . enoxaparin (LOVENOX) injection  40 mg Subcutaneous QHS  . lidocaine-EPINEPHrine  10 mL Other Once  .  morphine injection  1 mg Intravenous Once  . pantoprazole (PROTONIX) IV  40 mg Intravenous Q24H  . thiamine  100 mg Oral Daily   Continuous Infusions:   Time spent: > 35 minutes  [name]  [facility] Pager [phone] If 7PM-7AM, please contact night-coverage at [email or web address], password [facility] 04/12/2015, 4:13 PM  LOS: 2 days

## 2015-04-13 ENCOUNTER — Inpatient Hospital Stay (HOSPITAL_COMMUNITY): Payer: Self-pay

## 2015-04-13 NOTE — Progress Notes (Signed)
[facility] PROGRESS NOTE  [name] YQM:[id number] DOB: [birth year] DOA: 04/10/2015 PCP: No primary care provider on file.   Brief narrative: Patient is a 54 year old with history of alcoholic cirrhosis who presented complaining of abdominal discomfort and dyspnea. Had 1 L of fluid removed via paracentesis in the emergency department. Patient still complaining of abdominal discomfort with distended abdomen and subsequently IR consulted for further fluid removal.  Assessment/Plan: Principal Problem:   HCAP (healthcare-associated pneumonia) - Continues to improve on augmentin - chest x ray reports left lower lobe infiltrate.  Active Problems: Alcoholic cirrhosis of liver with ascities - pt is s/p paracentesis - no organisms seen on gram stain. No growth x 1 day on culture and no increase in WBC in peritoneal fluid. As such do not suspect patient has active intra abdominal infection. - I suspect abdominal discomfort is secondary to reaccumulation of fluid as such will have IR consulted for removal of fluid by mouth paracentesis  Alcohol abuse - stable no anxiety on evaluation   Code Status: full Family Communication:  None at bedside Disposition Plan: pending improvement in condition.   Consultants:  IR  Procedures:  paracentesis  Antibiotics:  Zosyn and Vancomycin>>> Augmentin 04/12/15  HPI/Subjective: Patient's only complaint is his abdomen being distended and having some discomfort. He denies any fevers  Objective: Filed Vitals:   04/13/15 1431  BP: 112/82  Pulse: 102  Temp: 97.8 F (36.6 C)  Resp: 18    Intake/Output Summary (Last 24 hours) at 04/13/15 1635 Last data filed at 04/13/15 1300  Gross per 24 hour  Intake    720 ml  Output      0 ml  Net    720 ml   Filed Weights   04/10/15 2305  Weight: 84.7 kg (186 lb 11.7 oz)    Exam:   General:  Pt in nad, alert and awake  Cardiovascular: rrr, no mrg  Respiratory: cta bl, no  wheezes  Abdomen: soft, distended, some discomfort with deep palpation, no rebound tenderness  Musculoskeletal: no cyanosis or clubbing   Data Reviewed: Basic Metabolic Panel:  Recent Labs Lab 04/10/15 1930 04/11/15 0532  NA 129* 130*  K 3.3* 3.5  CL 94* 101  CO2 25 23  GLUCOSE 111* 131*  BUN <5* <5*  CREATININE 0.55* 0.45*  CALCIUM 8.2* 7.7*   Liver Function Tests:  Recent Labs Lab 04/10/15 1930 04/11/15 0532  AST 109* 80*  ALT 41 33  ALKPHOS 178* 143*  BILITOT 2.5* 2.0*  PROT 6.7 5.4*  ALBUMIN 2.6* 2.2*    Recent Labs Lab 04/10/15 1930  LIPASE 84*   No results for input(s): AMMONIA in the last 168 hours. CBC:  Recent Labs Lab 04/10/15 1930 04/11/15 0532  WBC 10.0 8.1  NEUTROABS 7.0 5.1  HGB 15.3 12.7*  HCT 43.0 37.1*  MCV 101.2* 102.5*  PLT 125* 102*   Cardiac Enzymes: No results for input(s): CKTOTAL, CKMB, CKMBINDEX, TROPONINI in the last 168 hours. BNP (last 3 results) No results for input(s): BNP in the last 8760 hours.  ProBNP (last 3 results) No results for input(s): PROBNP in the last 8760 hours.  CBG: No results for input(s): GLUCAP in the last 168 hours.  Recent Results (from the past 240 hour(s))  Blood culture (routine x 2)     Status: None (Preliminary result)   Collection Time: 04/10/15  7:50 PM  Result Value Ref Range Status   Specimen Description BLOOD RIGHT HAND  Final   Special Requests  BOTTLES DRAWN AEROBIC ONLY 5CC  Final   Culture   Final    NO GROWTH 2 DAYS Performed at [facility]    Report Status PENDING  Incomplete  Blood culture (routine x 2)     Status: None (Preliminary result)   Collection Time: 04/10/15  7:55 PM  Result Value Ref Range Status   Specimen Description BLOOD RIGHT ARM  Final   Special Requests BOTTLES DRAWN AEROBIC AND ANAEROBIC 10CC  Final   Culture   Final    NO GROWTH 2 DAYS Performed at [facility]    Report Status PENDING  Incomplete  Body fluid culture     Status:  None (Preliminary result)   Collection Time: 04/10/15  9:21 PM  Result Value Ref Range Status   Specimen Description ASCITIC  Final   Special Requests NONE  Final   Gram Stain   Final    FEW WBC PRESENT, PREDOMINANTLY MONONUCLEAR NO ORGANISMS SEEN    Culture   Final    NO GROWTH 2 DAYS Performed at [facility]    Report Status PENDING  Incomplete  Culture, body fluid-bottle     Status: None (Preliminary result)   Collection Time: 04/10/15  9:29 PM  Result Value Ref Range Status   Specimen Description PARACENTESIS  Final   Special Requests NONE  Final   Culture   Final    NO GROWTH 2 DAYS Performed at [facility]    Report Status PENDING  Incomplete  Gram stain     Status: None   Collection Time: 04/10/15  9:29 PM  Result Value Ref Range Status   Specimen Description PARACENTESIS  Final   Special Requests NONE  Final   Gram Stain   Final    RARE WBC PRESENT, PREDOMINANTLY MONONUCLEAR NO ORGANISMS SEEN Performed at [facility]    Report Status 04/11/2015 FINAL  Final     Studies: No results found.  Scheduled Meds: . amoxicillin-clavulanate  2 tablet Oral Q12H  . enoxaparin (LOVENOX) injection  40 mg Subcutaneous QHS  . lidocaine-EPINEPHrine  10 mL Other Once  .  morphine injection  1 mg Intravenous Once  . pantoprazole (PROTONIX) IV  40 mg Intravenous Q24H  . thiamine  100 mg Oral Daily   Continuous Infusions:   Time spent: > 35 minutes  [name], [name]  [facility] Pager [phone] If 7PM-7AM, please contact night-coverage at [email or web address], password [email or web address] 04/13/2015, 4:35 PM  LOS: 3 days

## 2015-04-13 NOTE — Progress Notes (Signed)
Pt still awaiting paracentesis to be done. Pt complains of increasing abdominal girth due to his ascites which is causing him to have discomfort. Pt assisted back to bed. Given PRN for pain. Tried calling US to determine when procedure will be done, but tech was busy.

## 2015-04-13 NOTE — Accreditation Note (Signed)
Pt returned from ultrasound. Paracentesis done with 6 liters of fluid removed. Pt states he feels better at this time.

## 2015-04-13 NOTE — Care Management Note (Signed)
Case Management Note  Patient Details  Name: [name] MRN: [id number] Date of Birth: Mar 03, 1961  Subjective/Objective:  54 y/o m admitted w/PNA. Readmit. Lives [place] a [place] w/brother. No insurance,no pcp.  Provided w/community resources-encouraged [facility]-will set up pcp appt,also will have [facility] to eval for community services. Patient will be able to get meds @ [facility]-will eval the meds for assistance. [facility], then to home.CSW notified.                 Action/Plan:d/c plan home.   Expected Discharge Date:                  Expected Discharge Plan:  Home/Self Care  In-House Referral:  Clinical Social Work  Discharge planning Services  [facility] Consult, [facility], Medication Assistance  Post Acute Care Choice:    Choice offered to:     DME Arranged:    DME Agency:     HH Arranged:    HH Agency:     Status of Service:  In process, will continue to follow  Medicare Important Message Given:    Date Medicare IM Given:    Medicare IM give by:    Date Additional Medicare IM Given:    Additional Medicare Important Message give by:     If discussed at Long Length of Stay Meetings, dates discussed:    Additional Comments:  Lanier ClamMahabir, Mclean Moya, RN 04/13/2015, 1:39 PM

## 2015-04-13 NOTE — Procedures (Signed)
Successful US guided paracentesis from RLQ.  Yielded 6 liters of serous fluid.  No immediate complications.  Pt tolerated well.   Specimen was not sent for labs.  Pattricia BossMORGAN, Conan Mcmanaway D PA-C 04/13/2015 4:06 PM

## 2015-04-14 ENCOUNTER — Telehealth: Payer: Self-pay

## 2015-04-14 DIAGNOSIS — K7031 Alcoholic cirrhosis of liver with ascites: Principal | ICD-10-CM

## 2015-04-14 DIAGNOSIS — D696 Thrombocytopenia, unspecified: Secondary | ICD-10-CM

## 2015-04-14 DIAGNOSIS — E876 Hypokalemia: Secondary | ICD-10-CM

## 2015-04-14 DIAGNOSIS — J189 Pneumonia, unspecified organism: Secondary | ICD-10-CM

## 2015-04-14 LAB — COMPREHENSIVE METABOLIC PANEL
ALBUMIN: 2.1 g/dL — AB (ref 3.5–5.0)
ALK PHOS: 137 U/L — AB (ref 38–126)
ALT: 27 U/L (ref 17–63)
ANION GAP: 6 (ref 5–15)
AST: 46 U/L — ABNORMAL HIGH (ref 15–41)
BUN: 5 mg/dL — ABNORMAL LOW (ref 6–20)
CHLORIDE: 95 mmol/L — AB (ref 101–111)
CO2: 27 mmol/L (ref 22–32)
Calcium: 8 mg/dL — ABNORMAL LOW (ref 8.9–10.3)
Creatinine, Ser: 0.73 mg/dL (ref 0.61–1.24)
GFR calc non Af Amer: >60 mL/min (ref >60)
GLUCOSE: 134 mg/dL — AB (ref 65–99)
Potassium: 2.9 mmol/L — ABNORMAL LOW (ref 3.5–5.1)
SODIUM: 128 mmol/L — AB (ref 135–145)
Total Bilirubin: 2.4 mg/dL — ABNORMAL HIGH (ref 0.3–1.2)
Total Protein: 5.6 g/dL — ABNORMAL LOW (ref 6.5–8.1)

## 2015-04-14 LAB — CBC
HCT: 42.3 % (ref 39.0–52.0)
HEMOGLOBIN: 14.6 g/dL (ref 13.0–17.0)
MCH: 35.6 pg — AB (ref 26.0–34.0)
MCHC: 34.5 g/dL (ref 30.0–36.0)
MCV: 103.2 fL — ABNORMAL HIGH (ref 78.0–100.0)
PLATELETS: 85 10*3/uL — AB (ref 150–400)
RBC: 4.1 MIL/uL — AB (ref 4.22–5.81)
RDW: 12.9 % (ref 11.5–15.5)
WBC: 6.2 10*3/uL (ref 4.0–10.5)

## 2015-04-14 LAB — MAGNESIUM: Magnesium: 1.6 mg/dL — ABNORMAL LOW (ref 1.7–2.4)

## 2015-04-14 MED ORDER — LORAZEPAM 2 MG/ML IJ SOLN: 1.0000 mg | INTRAMUSCULAR | Status: DC | PRN

## 2015-04-14 MED ORDER — POTASSIUM CHLORIDE CRYS ER 20 MEQ PO TBCR
40.0000 meq | EXTENDED_RELEASE_TABLET | ORAL | Status: AC
  Administered 2015-04-14 (×2): 40 meq via ORAL
  Filled 2015-04-14 (×2): qty 2

## 2015-04-14 MED ORDER — PANTOPRAZOLE SODIUM 40 MG PO TBEC
40.0000 mg | DELAYED_RELEASE_TABLET | Freq: Every day | ORAL | Status: DC
  Administered 2015-04-14: 40 mg via ORAL
  Filled 2015-04-14: qty 1

## 2015-04-14 MED ORDER — SPIRONOLACTONE 50 MG PO TABS
50.0000 mg | ORAL_TABLET | Freq: Every day | ORAL | Status: DC
  Administered 2015-04-14 – 2015-04-15 (×2): 50 mg via ORAL
  Filled 2015-04-14 (×2): qty 1

## 2015-04-14 MED ORDER — FUROSEMIDE 40 MG PO TABS
40.0000 mg | ORAL_TABLET | Freq: Every day | ORAL | Status: DC
  Administered 2015-04-14 – 2015-04-15 (×2): 40 mg via ORAL
  Filled 2015-04-14 (×2): qty 1

## 2015-04-14 MED ORDER — MAGNESIUM SULFATE 2 GM/50ML IV SOLN
2.0000 g | Freq: Once | INTRAVENOUS | Status: AC
  Administered 2015-04-14: 2 g via INTRAVENOUS
  Filled 2015-04-14: qty 50

## 2015-04-14 NOTE — Telephone Encounter (Signed)
Message received from Lanier ClamKathy Mahabir, RN CM requesting a follow up appointment for the patient.   After reviewing his chart it was determined that he does not qualify for the [facility], so [facility]/15 2 1130 with Dr Venetia NightAmao. The visit information was placed on the AVS and an update was provided to K. Mahabir RN CM.

## 2015-04-14 NOTE — Progress Notes (Signed)
PROGRESS NOTE    [name] ZOX:[id number] DOB: 24-Jun-1960 DOA: 04/10/2015 PCP: No primary care provider on file.  HPI/Brief narrative 54 year old male patient with history of alcoholic cirrhosis, ongoing alcohol abuse, tobacco abuse, depression, recent hospitalization 10/30-10/31 for ascites for which she underwent therapeutic paracentesis 4.6 L, no SBP, newly started on Aldactone, now presented on 04/10/15 with complaints of worsening abdominal distention and dyspnea. He also complained of cough with white sputum but no chest pain. He did not take any of his medications because he couldn't afford them.   Assessment/Plan:  Alcoholic cirrhosis with symptomatic ascites - S/P paracentesis 1 L in ED followed by 6 L by IR on 11/15 - Improved. Resume Aldactone 50 mg daily and will add Lasix 40 MG daily. - Ascitic fluid not indicative of SBP and culture negative to date. - DC antibiotic for this indication.   Hypokalemia/hypomagnesemia  - Aggressively replace and follow  Alcohol dependence - Abstinence counseled. No features of overt withdrawal.  Tobacco abuse - Cessation counseled.  Hyponatremia - Most likely secondary to liver disease and hyperkalemia. Diuretics and periodically follow.  Thrombocytopenia - Secondary to cirrhosis. No bleeding reported.  Possible left lower lobe HCAP - Initially treated with IV vancomycin and Zosyn and then transitioned to oral Augmentin. Complete total of 7 days treatment on 11/18. - will need follow-up chest x-ray in 3-4 weeks to ensure resolution of pneumonia findings.   DVT prophylaxis: SCDs Code Status: Full Family Communication: None at bedside Disposition Plan: DC home possibly in the next 24-48 hours.   Consultants:  Interventional radiology  Procedures:  Ultrasound-guided paracentesis 11/15: Drained 6 L of ascitic fluid  paracentesis by EDP on admission: Drained 1 L of ascitic fluid.  Antibiotics:  IV vancomycin 11/12 >  11/14  IV vancomycin 11/12 > 11/13  Oral Augmentin 11/14 > 11/18   Subjective: Feels much better. Improved abdominal distention. No dyspnea reported. No pain reported.  Objective: Filed Vitals:   04/13/15 1638 04/13/15 1656 04/13/15 2233 04/14/15 0611  BP: 108/73 112/71 110/74 96/58  Pulse:  97 91 94  Temp:  97.7 F (36.5 C) 97.3 F (36.3 C) 98 F (36.7 C)  TempSrc:  Oral Oral Oral  Resp:  Height:      Weight:      SpO2:   98% 98%    Intake/Output Summary (Last 24 hours) at 04/14/15 1605 Last data filed at 04/14/15 8119  Gross per 24 hour  Intake    360 ml  Output      0 ml  Net    360 ml   Filed Weights   04/10/15 2305  Weight: 84.7 kg (186 lb 11.7 oz)     Exam:  General exam: pleasant middle-aged male, sitting up comfortably in chair. Respiratory system: Clear. No increased work of breathing. Cardiovascular system: S1 & S2 heard, RRR. No JVD, murmurs, gallops, clicks. 3+ pitting bilateral leg edema. Gastrointestinal system: Abdomen is distended, soft and nontender.  Ascites + +. Normal bowel sounds heard. Central nervous system: Alert and oriented. No focal neurological deficits. Extremities: Symmetric 5 x 5 power.   Data Reviewed: Basic Metabolic Panel:  Recent Labs Lab 04/10/15 1930 04/11/15 0532 04/14/15 0850  NA 129* 130* 128*  K 3.3* 3.5 2.9*  CL 94* 101 95*  CO2 GLUCOSE 111* 131* 134*  BUN <5* <5* 5*  CREATININE 0.55* 0.45* 0.73  CALCIUM 8.2* 7.7* 8.0*  MG  --   --  1.6*   Liver Function Tests:  Recent Labs Lab 04/10/15 1930 04/11/15 0532 04/14/15 0850  AST 109* 80* 46*  ALT 41 33 27  ALKPHOS 178* 143* 137*  BILITOT 2.5* 2.0* 2.4*  PROT 6.7 5.4* 5.6*  ALBUMIN 2.6* 2.2* 2.1*    Recent Labs Lab 04/10/15 1930  LIPASE 84*   No results for input(s): AMMONIA in the last 168 hours. CBC:  Recent Labs Lab 04/10/15 1930 04/11/15 0532 04/14/15 0850  WBC 10.0 8.1 6.2  NEUTROABS 7.0 5.1  --   HGB 15.3 12.7*  14.6  HCT 43.0 37.1* 42.3  MCV 101.2* 102.5* 103.2*  PLT 125* 102* 85*   Cardiac Enzymes: No results for input(s): CKTOTAL, CKMB, CKMBINDEX, TROPONINI in the last 168 hours. BNP (last 3 results) No results for input(s): PROBNP in the last 8760 hours. CBG: No results for input(s): GLUCAP in the last 168 hours.  Recent Results (from the past 240 hour(s))  Blood culture (routine x 2)     Status: None (Preliminary result)   Collection Time: 04/10/15  7:50 PM  Result Value Ref Range Status   Specimen Description BLOOD RIGHT HAND  Final   Special Requests BOTTLES DRAWN AEROBIC ONLY 5CC  Final   Culture   Final    NO GROWTH 3 DAYS Performed at [facility]    Report Status PENDING  Incomplete  Blood culture (routine x 2)     Status: None (Preliminary result)   Collection Time: 04/10/15  7:55 PM  Result Value Ref Range Status   Specimen Description BLOOD RIGHT ARM  Final   Special Requests BOTTLES DRAWN AEROBIC AND ANAEROBIC 10CC  Final   Culture   Final    NO GROWTH 3 DAYS Performed at [facility]    Report Status PENDING  Incomplete  Body fluid culture     Status: None (Preliminary result)   Collection Time: 04/10/15  9:21 PM  Result Value Ref Range Status   Specimen Description ASCITIC  Final   Special Requests NONE  Final   Gram Stain   Final    FEW WBC PRESENT, PREDOMINANTLY MONONUCLEAR NO ORGANISMS SEEN    Culture   Final    NO GROWTH 3 DAYS Performed at [facility]    Report Status PENDING  Incomplete  Culture, body fluid-bottle     Status: None (Preliminary result)   Collection Time: 04/10/15  9:29 PM  Result Value Ref Range Status   Specimen Description PARACENTESIS  Final   Special Requests NONE  Final   Culture   Final    NO GROWTH 3 DAYS Performed at [facility]    Report Status PENDING  Incomplete  Gram stain     Status: None   Collection Time: 04/10/15  9:29 PM  Result Value Ref Range Status   Specimen Description  PARACENTESIS  Final   Special Requests NONE  Final   Gram Stain   Final    RARE WBC PRESENT, PREDOMINANTLY MONONUCLEAR NO ORGANISMS SEEN Performed at [facility]    Report Status 04/11/2015 FINAL  Final           Studies: US Paracentesis  04/13/2015  INDICATION: Cirrhosis, recurrent ascites and request for paracentesis. EXAM: ULTRASOUND-GUIDED PARACENTESIS COMPARISON:  Paracentesis 03/28/2015. MEDICATIONS: None. COMPLICATIONS: None immediate TECHNIQUE: Informed written consent was obtained from the patient after a discussion of the risks, benefits and alternatives to treatment. A timeout was performed prior to the initiation of the procedure.  Initial ultrasound scanning demonstrates a large amount of ascites within the right lower abdominal quadrant. The right lower abdomen was prepped and draped in the usual sterile fashion. 1% lidocaine was used for local anesthesia. Under direct ultrasound guidance, a 19 gauge, 10-cm, Yueh catheter was introduced. An ultrasound image was saved for documentation purposed. The paracentesis was performed. The catheter was removed and a dressing was applied. The patient tolerated the procedure well without immediate post procedural complication. FINDINGS: A total of approximately 6 liters of serous fluid was removed. IMPRESSION: Successful ultrasound-guided paracentesis yielding 6 liters of peritoneal fluid. Read By:  Pattricia BossKoreen Morgan PA-C Electronically Signed   By: Irish LackGlenn  Yamagata M.D.   On: 04/13/2015 16:37        Scheduled Meds: . amoxicillin-clavulanate  2 tablet Oral Q12H  . enoxaparin (LOVENOX) injection  40 mg Subcutaneous QHS  . furosemide  40 mg Oral Daily  . lidocaine-EPINEPHrine  10 mL Other Once  .  morphine injection  1 mg Intravenous Once  . pantoprazole  40 mg Oral QHS  . potassium chloride  40 mEq Oral Q4H  . spironolactone  50 mg Oral Daily  . thiamine  100 mg Oral Daily   Continuous Infusions:   Principal Problem:   HCAP  (healthcare-associated pneumonia) Active Problems:   Pneumonia    Time spent: 25 minutes.    Marcellus ScottHONGALGI,Jhovany Weidinger, MD, FACP, FHM. [facility] Pager [phone]  If 7PM-7AM, please contact night-coverage [email or web address] Password [email or web address] 04/14/2015, 4:05 PM    LOS: 4 days

## 2015-04-14 NOTE — Progress Notes (Signed)
Key Points: Use following P&T approved IV to PO change policy.  Description contains the criteria that are approved Note: Policy Excludes:  Esophagectomy patientsPHARMACIST - PHYSICIAN COMMUNICATION  CONCERNING: IV to Oral Route Change Policy  RECOMMENDATION: This patient is receiving protonix by the intravenous route.  Based on criteria approved by the Pharmacy and Therapeutics Committee, the intravenous medication(s) is/are being converted to the equivalent oral dose form(s).   DESCRIPTION: These criteria include:  The patient is eating (either orally or via tube) and/or has been taking other orally administered medications for a least 24 hours  The patient has no evidence of active gastrointestinal bleeding or impaired GI absorption (gastrectomy, short bowel, patient on TNA or NPO).  If you have questions about this conversion, please contact the Pharmacy Department  []   [phone]( [phone] )  [facility] []   [phone]( [phone] )  [facility] []   [phone]( [phone] )  [facility] []   [phone] )  [facility] [x]   [phone]( [phone] )  [facility]   Arley Phenixllen Victorian Gunn RPh 04/14/2015, 10:42 AM Pager [phone]

## 2015-04-14 NOTE — Care Management Note (Signed)
Case Management Note  Patient Details  Name: [name] MRN: [id number] Date of Birth: 1961-04-08  Subjective/Objective:  [facility]they have available Augmentin 875mg  tablets $10.00 Patient has no insurance & cannot afford his med or co pay.[facility]. If MD agree to Augmentin, please [facility], they can process faster since they close @ 5p.  [facility], then [facility]. Will transport with bus passes.                  Action/Plan:d/c home.   Expected Discharge Date:                  Expected Discharge Plan:  Home/Self Care  In-House Referral:  Clinical Social Work  Discharge planning Services  CM Consult, [facility]  Post Acute Care Choice:    Choice offered to:     DME Arranged:    DME Agency:     HH Arranged:    HH Agency:     Status of Service:  In process, will continue to follow  Medicare Important Message Given:    Date Medicare IM Given:    Medicare IM give by:    Date Additional Medicare IM Given:    Additional Medicare Important Message give by:     If discussed at Long Length of Stay Meetings, dates discussed:    Additional Comments:  [name], Bryce Meester, RN 04/14/2015, 11:20 AM

## 2015-04-15 DIAGNOSIS — F1020 Alcohol dependence, uncomplicated: Secondary | ICD-10-CM

## 2015-04-15 DIAGNOSIS — Z720 Tobacco use: Secondary | ICD-10-CM

## 2015-04-15 LAB — CBC
HEMATOCRIT: 37.4 % — AB (ref 39.0–52.0)
HEMOGLOBIN: 13 g/dL (ref 13.0–17.0)
MCH: 35.9 pg — AB (ref 26.0–34.0)
MCHC: 34.8 g/dL (ref 30.0–36.0)
MCV: 103.3 fL — ABNORMAL HIGH (ref 78.0–100.0)
Platelets: 92 10*3/uL — ABNORMAL LOW (ref 150–400)
RBC: 3.62 MIL/uL — AB (ref 4.22–5.81)
RDW: 13.2 % (ref 11.5–15.5)
WBC: 6.9 10*3/uL (ref 4.0–10.5)

## 2015-04-15 LAB — BASIC METABOLIC PANEL
Anion gap: 4 — ABNORMAL LOW (ref 5–15)
BUN: 5 mg/dL — AB (ref 6–20)
CHLORIDE: 97 mmol/L — AB (ref 101–111)
CO2: 27 mmol/L (ref 22–32)
Calcium: 7.8 mg/dL — ABNORMAL LOW (ref 8.9–10.3)
Creatinine, Ser: 0.69 mg/dL (ref 0.61–1.24)
GFR calc non Af Amer: >60 mL/min (ref >60)
Glucose, Bld: 115 mg/dL — ABNORMAL HIGH (ref 65–99)
POTASSIUM: 3.4 mmol/L — AB (ref 3.5–5.1)
SODIUM: 128 mmol/L — AB (ref 135–145)

## 2015-04-15 LAB — BODY FLUID CULTURE: CULTURE: NO GROWTH

## 2015-04-15 LAB — MAGNESIUM: MAGNESIUM: 1.6 mg/dL — AB (ref 1.7–2.4)

## 2015-04-15 MED ORDER — MAGNESIUM SULFATE 4 GM/100ML IV SOLN
4.0000 g | Freq: Once | INTRAVENOUS | Status: AC
  Administered 2015-04-15: 4 g via INTRAVENOUS
  Filled 2015-04-15: qty 100

## 2015-04-15 MED ORDER — FUROSEMIDE 40 MG PO TABS: 40.0000 mg | ORAL_TABLET | Freq: Every day | ORAL | Status: DC

## 2015-04-15 MED ORDER — AMOXICILLIN-POT CLAVULANATE ER 1000-62.5 MG PO TB12: 2.0000 | ORAL_TABLET | Freq: Two times a day (BID) | ORAL | Status: DC

## 2015-04-15 MED ORDER — FOLIC ACID 1 MG PO TABS: 1.0000 mg | ORAL_TABLET | Freq: Every day | ORAL | Status: DC

## 2015-04-15 MED ORDER — POTASSIUM CHLORIDE CRYS ER 20 MEQ PO TBCR
40.0000 meq | EXTENDED_RELEASE_TABLET | Freq: Once | ORAL | Status: AC
  Administered 2015-04-15: 40 meq via ORAL
  Filled 2015-04-15: qty 2

## 2015-04-15 MED ORDER — PANTOPRAZOLE SODIUM 40 MG PO TBEC: 40.0000 mg | DELAYED_RELEASE_TABLET | Freq: Every day | ORAL | Status: DC

## 2015-04-15 MED ORDER — SPIRONOLACTONE 50 MG PO TABS: 50.0000 mg | ORAL_TABLET | Freq: Every day | ORAL | Status: DC

## 2015-04-15 MED ORDER — THIAMINE HCL 100 MG PO TABS: 100.0000 mg | ORAL_TABLET | Freq: Every day | ORAL | Status: DC

## 2015-04-15 NOTE — Discharge Instructions (Signed)
Ascites °Ascites is a collection of excess fluid in the abdomen. Ascites can range from mild to severe. It can get worse without treatment. °CAUSES °Possible causes include: °· Cirrhosis. This is the most common cause of ascites. °· Infection or inflammation in the abdomen. °· Cancer in the abdomen. °· Heart failure. °· Kidney disease. °· Inflammation of the pancreas. °· Clots in the veins of the liver. °SIGNS AND SYMPTOMS °Signs and symptoms may include: °· A feeling of fullness in your abdomen. This is common. °· An increase in the size of your abdomen or your waist. °· Swelling in your legs. °· Swelling of the scrotum in men. °· Difficulty breathing. °· Abdominal pain. °· Sudden weight gain. °If the condition is mild, you may not have symptoms. °DIAGNOSIS °To make a diagnosis, your health care provider will: °· Ask about your medical history. °· Perform a physical exam. °· Order imaging tests, such as an ultrasound or CT scan of your abdomen. °TREATMENT °Treatment depends on the cause of the ascites. It may include: °· Taking a pill to make you urinate. This is called a water pill (diuretic pill). °· Strictly reducing your salt (sodium) intake. Salt can cause extra fluid to be kept in the body, and this makes ascites worse. °· Having a procedure to remove fluid from your abdomen (paracentesis). °· Having a procedure to transfer fluid from your abdomen into a vein. °· Having a procedure that connects two of the major veins within your liver and relieves pressure on your liver (TIPS procedure). °Ascites may go away or improve with treatment of the condition that caused it.  °HOME CARE INSTRUCTIONS °· Keep track of your weight. To do this, weigh yourself at the same time every day and record your weight. °· Keep track of how much you drink and any changes in the amount you urinate. °· Follow any instructions that your health care provider gives you about how much to drink. °· Try not to eat salty (high-sodium)  foods. °· Take medicines only as directed by your health care provider. °· Keep all follow-up visits as directed by your health care provider. This is important. °· Report any changes in your health to your health care provider, especially if you develop new symptoms or your symptoms get worse. °SEEK MEDICAL CARE IF: °· Your gain more than 3 pounds in 3 days. °· Your abdominal size or your waist size increases. °· You have new swelling in your legs. °· The swelling in your legs gets worse. °SEEK IMMEDIATE MEDICAL CARE IF: °· You develop a fever. °· You develop confusion. °· You develop new or worsening difficulty breathing. °· You develop new or worsening abdominal pain. °· You develop new or worsening swelling in the scrotum (in men). °  °This information is not intended to replace advice given to you by your health care provider. Make sure you discuss any questions you have with your health care provider. °  °Document Released: 05/15/2005 Document Revised: 06/05/2014 Document Reviewed: 12/12/2013 °Elsevier Interactive Patient Education ©2016 [employer] ° °

## 2015-04-15 NOTE — Progress Notes (Signed)
Cm gave pt MATCH letter (with no co-pay) to pt who [facility].  CM spoke with CSW who has provided bus passes for pt to get to clinic and then to hotel where pt is staying.  No other CM needs were communicated.

## 2015-04-15 NOTE — Evaluation (Signed)
Physical Therapy Evaluation-1x Patient Details Name: [name] MRN: [id number] DOB: 1960/11/03 Today's Date: 04/15/2015   History of Present Illness  54 yo male admitted with Pna, ascites. s/p paracentesis 11/15. Hx of liver cirrhosis, ETOH abuse, ascites.   Clinical Impression  On eval, pt was supervision level for mobility-walked ~400 feet. Intermittent unsteadiness but no overt LOB. Encouraged pt to ambulate in hallways with supervision. Do not anticipate any follow up PT needs. Will sign off.     Follow Up Recommendations No PT follow up;Supervision - Intermittent    Equipment Recommendations  None recommended by PT    Recommendations for Other Services       Precautions / Restrictions Precautions Precautions: Fall Restrictions Weight Bearing Restrictions: No      Mobility  Bed Mobility Overal bed mobility: Independent                Transfers Overall transfer level: Independent                  Ambulation/Gait Ambulation/Gait assistance: Supervision Ambulation Distance (Feet): 400 Feet Assistive device: None (IV pole) Gait Pattern/deviations: Step-through pattern     General Gait Details: walked ~200 feet while holding onto IV pole and ~200 feet without support. Tolerated distance well. unsteady at times but no overt LOB  Information systems managertairs            Wheelchair Mobility    Modified Rankin (Stroke Patients Only)       Balance Overall balance assessment: Needs assistance         Standing balance support: During functional activity Standing balance-Leahy Scale: Good                               Pertinent Vitals/Pain Pain Assessment: Faces Faces Pain Scale: Hurts little more Pain Location: abdomen Pain Descriptors / Indicators: Sore Pain Intervention(s): Monitored during session    Home Living Family/patient expects to be discharged to:: Other (Comment) Living Arrangements: Other relatives (brother)   Type of Home:   (motel) Home Access: Level entry     Home Layout: One level Home Equipment: Environmental consultantWalker - 2 wheels      Prior Function Level of Independence: Independent with assistive device(s)         Comments: uses RW PRN     Hand Dominance        Extremity/Trunk Assessment   Upper Extremity Assessment: Overall WFL for tasks assessed (pt c/o bil forearm muscle cramping)           Lower Extremity Assessment: Overall WFL for tasks assessed      Cervical / Trunk Assessment: Normal  Communication   Communication: Deaf  Cognition Arousal/Alertness: Awake/alert Behavior During Therapy: WFL for tasks assessed/performed Overall Cognitive Status: Within Functional Limits for tasks assessed                      General Comments      Exercises        Assessment/Plan    PT Assessment Patent does not need any further PT services  PT Diagnosis Difficulty walking;Generalized weakness   PT Problem List    PT Treatment Interventions     PT Goals (Current goals can be found in the Care Plan section) Acute Rehab PT Goals Patient Stated Goal: home soon PT Goal Formulation: All assessment and education complete, DC therapy    Frequency     Barriers to discharge  Co-evaluation               End of Session Equipment Utilized During Treatment: Gait belt Activity Tolerance: Patient tolerated treatment well Patient left: in bed;with call bell/phone within reach           Time: 0910-0921 PT Time Calculation (min) (ACUTE ONLY): 11 min   Charges:   PT Evaluation $Initial PT Evaluation Tier I: 1 Procedure     PT G Codes:        Rebeca Alert, MPT Pager: [phone]

## 2015-04-15 NOTE — Discharge Summary (Signed)
Physician Discharge Summary  [name] ZOX:[id number] DOB: 1961-02-08 DOA: 04/10/2015  PCP: No primary care provider on file.  Admit date: 04/10/2015 Discharge date: 04/15/2015  Time spent: Greater than 30 minutes  Recommendations for Outpatient Follow-up:  1. Dr. Venetia Night at [facility] on 04/20/15 at 11:30 AM. To be seen with repeat labs (CBC, CMP & magnesium). Please follow final blood cultures that were sent from the hospital on 04/10/15 and ascitic fluid culture that was sent on 04/10/15. 2. Recommend follow-up chest x-ray in 3-4 weeks to ensure resolution of pneumonia findings.  Discharge Diagnoses:  Principal Problem:   HCAP (healthcare-associated pneumonia) Active Problems:   Pneumonia   Discharge Condition: Improved & Stable  Diet recommendation: Heart healthy diet.  Filed Weights   04/10/15 2305  Weight: 84.7 kg (186 lb 11.7 oz)    History of present illness:  54 year old male patient with history of alcoholic cirrhosis, ongoing alcohol abuse, tobacco abuse, depression, recent hospitalization 10/30-10/31 for ascites for which she underwent therapeutic paracentesis 4.6 L, no SBP, newly started on Aldactone, now presented on 04/10/15 with complaints of worsening abdominal distention and dyspnea. He also complained of cough with white sputum but no chest pain. He did not take any of his medications because he couldn't afford them.  Hospital Course:   Alcoholic cirrhosis with symptomatic ascites - S/P paracentesis 1 L in ED followed by 6 L by IR on 11/15 - Improved. Resumed Aldactone 50 mg daily and added Lasix 40 MG daily. - Ascitic fluid not indicative of SBP and culture negative to date. - DC antibiotic for this indication.  - Ascitic fluid results 03/13/15: Did not reveal malignant cells. - Case management has arranged for all medications to be picked up at [facility] clinic pharmacy using [facility]  program.  Hypokalemia/hypomagnesemia  - Aggressively replaced prior to discharge. Follow-up with labs as outpatient next week.  Alcohol dependence - Abstinence counseled. No features of overt withdrawal. Patient states that he continues to drink 3-4 40 ounce beers daily.  Tobacco abuse - Cessation counseled. Declines nicotine patch.  Hyponatremia - Most likely secondary to liver disease and hypervolemia. Diuretics and periodically follow BMP.  Thrombocytopenia - Secondary to cirrhosis. No bleeding reported. Stable.  Possible left lower lobe HCAP - Initially treated with IV vancomycin and Zosyn and then transitioned to oral Augmentin. Complete total of 7 days treatment on 11/18. - will need follow-up chest x-ray in 3-4 weeks to ensure resolution of pneumonia findings.    Consultants:  Interventional radiology  Procedures:  Ultrasound-guided paracentesis 11/15: Drained 6 L of ascitic fluid  paracentesis by EDP on admission: Drained 1 L of ascitic fluid.  Antibiotics:  IV vancomycin 11/12 > 11/14  IV vancomycin 11/12 > 11/13  Oral Augmentin 11/14 > 11/18   Discharge Exam:  Complaints: Continues to feel better. Denies complaints. Abdominal distention significantly improved and denies pain. No dyspnea, cough or chest pain. Tolerating diet.  Filed Vitals:   04/13/15 2233 04/14/15 0611 04/14/15 2038 04/15/15 0458  BP: 110/74 96/58 114/73 102/71  Pulse: 91 94 87 92  Temp: 97.3 F (36.3 C) 98 F (36.7 C) 97.8 F (36.6 C) 98 F (36.7 C)  TempSrc: Oral Oral Oral Oral  Resp: 18 18 18 18   Height:      Weight:      SpO2: 98% 98% 100% 100%    General exam: pleasant middle-aged male, sitting up comfortably in chair and eating lunch. Respiratory system: Clear. No  increased work of breathing. Cardiovascular system: S1 & S2 heard, RRR. No JVD, murmurs, gallops, clicks. 3+ pitting bilateral leg edema. Telemetry: Sinus rhythm. Gastrointestinal system: Abdomen is mildly  distended, soft and nontender. Ascites + +. Normal bowel sounds heard. Central nervous system: Alert and oriented. No focal neurological deficits. Extremities: Symmetric 5 x 5 power.  Discharge Instructions      Discharge Instructions    Call MD for:  difficulty breathing, headache or visual disturbances    Complete by:  As directed      Call MD for:  extreme fatigue    Complete by:  As directed      Call MD for:  hives    Complete by:  As directed      Call MD for:  persistant dizziness or light-headedness    Complete by:  As directed      Call MD for:  persistant nausea and vomiting    Complete by:  As directed      Call MD for:  severe uncontrolled pain    Complete by:  As directed      Call MD for:  temperature >100.4    Complete by:  As directed      Call MD for:    Complete by:  As directed   Worsening abdominal distention or pain.     Diet - low sodium heart healthy    Complete by:  As directed      Increase activity slowly    Complete by:  As directed             Medication List    STOP taking these medications        clindamycin 150 MG capsule  Commonly known as:  CLEOCIN     LORazepam 0.5 MG tablet  Commonly known as:  ATIVAN     nicotine 21 mg/24hr patch  Commonly known as:  NICODERM CQ - dosed in mg/24 hours      TAKE these medications        amoxicillin-clavulanate 1000-62.5 MG 12 hr tablet  Commonly known as:  AUGMENTIN XR  Take 2 tablets by mouth 2 (two) times daily.     folic acid 1 MG tablet  Commonly known as:  FOLVITE  Take 1 tablet (1 mg total) by mouth daily.     furosemide 40 MG tablet  Commonly known as:  LASIX  Take 1 tablet (40 mg total) by mouth daily.     pantoprazole 40 MG tablet  Commonly known as:  PROTONIX  Take 1 tablet (40 mg total) by mouth daily.     spironolactone 50 MG tablet  Commonly known as:  ALDACTONE  Take 1 tablet (50 mg total) by mouth daily.     thiamine 100 MG tablet  Take 1 tablet (100 mg total) by  mouth daily.       Follow-up Information    Follow up with [facility]. Go on 04/20/2015.   Specialty:  Internal Medicine   Why:  at 11:30am for a hospital follow up appointment with Dr Venetia Night. You will receive a call 2 days prior to confirm the appointment. To be seen with repeat labs (CBC, CMP & Mg).   Contact information:   [id number] [place] [address] [phone]      Please follow up.   Why:  Go directly there @ d/c for medicines-they close @ 5p   Contact information:   Pharmacy @  [facility] [address] [phone]       The results of significant diagnostics from this hospitalization (including imaging, microbiology, ancillary and laboratory) are listed below for reference.    Significant Diagnostic Studies: US Paracentesis  04/13/2015  INDICATION: Cirrhosis, recurrent ascites and request for paracentesis. EXAM: ULTRASOUND-GUIDED PARACENTESIS COMPARISON:  Paracentesis 03/28/2015. MEDICATIONS: None. COMPLICATIONS: None immediate TECHNIQUE: Informed written consent was obtained from the patient after a discussion of the risks, benefits and alternatives to treatment. A timeout was performed prior to the initiation of the procedure. Initial ultrasound scanning demonstrates a large amount of ascites within the right lower abdominal quadrant. The right lower abdomen was prepped and draped in the usual sterile fashion. 1% lidocaine was used for local anesthesia. Under direct ultrasound guidance, a 19 gauge, 10-cm, Yueh catheter was introduced. An ultrasound image was saved for documentation purposed. The paracentesis was performed. The catheter was removed and a dressing was applied. The patient tolerated the procedure well without immediate post procedural complication. FINDINGS: A total of approximately 6 liters of serous fluid was removed. IMPRESSION: Successful ultrasound-guided paracentesis yielding 6 liters of  peritoneal fluid. Read By:  Pattricia Boss PA-C Electronically Signed   By: Irish Lack M.D.   On: 04/13/2015 16:37   US Paracentesis  03/28/2015  CLINICAL DATA:  Cirrhosis, ascites EXAM: ULTRASOUND GUIDED PARACENTESIS TECHNIQUE: The procedure, risks (including but not limited to bleeding, infection, organ damage ), benefits, and alternatives were explained to the patient. Questions regarding the procedure were encouraged and answered. The patient understands and consents to the procedure. Survey ultrasound of the abdomen was performed and an appropriate skin entry site in the right lateral abdomen was selected. Skin site was marked, prepped with Betadine, and draped in usual sterile fashion, and infiltrated locally with 1% lidocaine. A Safe-T-Centesis sheath needle was advanced into the peritoneal space until fluid could be aspirated. The sheath was advanced and the needle removed. 4.6 L of clear pale yellowascites were aspirated. COMPLICATIONS: COMPLICATIONS none IMPRESSION: Technically successful ultrasound guided paracentesis, removing 4.6 L of ascites. Electronically Signed   By: Corlis Leak M.D.   On: 03/28/2015 12:08   Dg Chest Portable 1 View  04/10/2015  CLINICAL DATA:  Dyspnea. pt from home/hotel. Pt with ascites d/t chronic alcohol use. Drank today. Abdomen distended. Abdominal pain. Smoker. EXAM: PORTABLE CHEST 1 VIEW COMPARISON:  03/28/2015 FINDINGS: Heart size is normal. There is mild prominence of interstitial markings and perihilar bronchitic change. More focal opacity at the left lung base appears new and likely represents acute infectious process. Consider aspiration pneumonia. No pulmonary edema. IMPRESSION: 1. Bronchitic changes. 2. Left lower lobe infiltrate. Electronically Signed   By: Norva Pavlov M.D.   On: 04/10/2015 18:33   Dg Abd Acute W/chest  03/28/2015  CLINICAL DATA:  Abdominal pain and distention EXAM: DG ABDOMEN ACUTE W/ 1V CHEST COMPARISON:  Chest radiograph August 25, 2013; CT abdomen and pelvis March 12, 2015 FINDINGS: PA chest: There is no edema or consolidation. The heart size and pulmonary vascularity are normal. No adenopathy. Supine and upright abdomen: There is no bowel dilatation or air-fluid level suggesting obstruction. No free air. There is moderate stool throughout the colon. There are scattered foci of arterial vascular calcification. IMPRESSION: Bowel gas pattern unremarkable. Moderate stool in colon. No lung edema or consolidation. Electronically Signed   By: Bretta Bang III M.D.   On: 03/28/2015 02:27    Microbiology: Recent Results (from the past 240 hour(s))  Blood  culture (routine x 2)     Status: None (Preliminary result)   Collection Time: 04/10/15  7:50 PM  Result Value Ref Range Status   Specimen Description BLOOD RIGHT HAND  Final   Special Requests BOTTLES DRAWN AEROBIC ONLY 5CC  Final   Culture   Final    NO GROWTH 3 DAYS [facility]formed at [facility]    Report Status PENDING  Incomplete  Blood culture (routine x 2)     Status: None (Preliminary result)   Collection Time: 04/10/15  7:55 PM  Result Value Ref Range Status   Specimen Description BLOOD RIGHT ARM  Final   Special Requests BOTTLES DRAWN AEROBIC AND ANAEROBIC 10CC  Final   Culture   Final    NO GROWTH 3 DAYS [facility]formed at [facility]    Report Status PENDING  Incomplete  Body fluid culture     Status: None   Collection Time: 04/10/15  9:21 PM  Result Value Ref Range Status   Specimen Description ASCITIC  Final   Special Requests NONE  Final   Gram Stain   Final    FEW WBC PRESENT, PREDOMINANTLY MONONUCLEAR NO ORGANISMS SEEN    Culture   Final    NO GROWTH 3 DAYS Performed at [facility]    Report Status 04/15/2015 FINAL  Final  Culture, body fluid-bottle     Status: None (Preliminary result)   Collection Time: 04/10/15  9:29 PM  Result Value Ref Range Status   Specimen Description PARACENTESIS  Final   Special Requests  NONE  Final   Culture   Final    NO GROWTH 3 DAYS Performed at [facility]    Report Status PENDING  Incomplete  Gram stain     Status: None   Collection Time: 04/10/15  9:29 PM  Result Value Ref Range Status   Specimen Description PARACENTESIS  Final   Special Requests NONE  Final   Gram Stain   Final    RARE WBC PRESENT, PREDOMINANTLY MONONUCLEAR NO ORGANISMS SEEN Performed at [facility]    Report Status 04/11/2015 FINAL  Final     Labs: Basic Metabolic Panel:  Recent Labs Lab 04/10/15 1930 04/11/15 0532 04/14/15 0850 04/15/15 0458  NA 129* 130* 128* 128*  K 3.3* 3.5 2.9* 3.4*  CL 94* 101 95* 97*  CO2 25 23 27 27   GLUCOSE 111* 131* 134* 115*  BUN <5* <5* 5* 5*  CREATININE 0.55* 0.45* 0.73 0.69  CALCIUM 8.2* 7.7* 8.0* 7.8*  MG  --   --  1.6* 1.6*   Liver Function Tests:  Recent Labs Lab 04/10/15 1930 04/11/15 0532 04/14/15 0850  AST 109* 80* 46*  ALT 41 33 27  ALKPHOS 178* 143* 137*  BILITOT 2.5* 2.0* 2.4*  PROT 6.7 5.4* 5.6*  ALBUMIN 2.6* 2.2* 2.1*    Recent Labs Lab 04/10/15 1930  LIPASE 84*   No results for input(s): AMMONIA in the last 168 hours. CBC:  Recent Labs Lab 04/10/15 1930 04/11/15 0532 04/14/15 0850 04/15/15 0458  WBC 10.0 8.1 6.2 6.9  NEUTROABS 7.0 5.1  --   --   HGB 15.3 12.7* 14.6 13.0  HCT 43.0 37.1* 42.3 37.4*  MCV 101.2* 102.5* 103.2* 103.3*  PLT 125* 102* 85* 92*   Cardiac Enzymes: No results for input(s): CKTOTAL, CKMB, CKMBINDEX, TROPONINI in the last 168 hours. BNP: BNP (last 3 results) No results for input(s): BNP in the last 8760 hours.  ProBNP (  last 3 results) No results for input(s): PROBNP in the last 8760 hours.  CBG: No results for input(s): GLUCAP in the last 168 hours.      Signed:  Marcellus Scott, MD, FACP, FHM. [facility] Pager [phone]  If 7PM-7AM, please contact night-coverage [email or web address] Password [facility] 04/15/2015, 12:36 PM

## 2015-04-16 ENCOUNTER — Encounter (HOSPITAL_COMMUNITY): Payer: Self-pay | Admitting: *Deleted

## 2015-04-16 ENCOUNTER — Emergency Department (HOSPITAL_COMMUNITY)
Admission: EM | Admit: 2015-04-16 | Discharge: 2015-04-17 | Disposition: A | Discharge status: Home / Self Care | Payer: Self-pay | Attending: Emergency Medicine | Admitting: Emergency Medicine

## 2015-04-16 DIAGNOSIS — R0602 Shortness of breath: Secondary | ICD-10-CM | POA: Insufficient documentation

## 2015-04-16 DIAGNOSIS — R2240 Localized swelling, mass and lump, unspecified lower limb: Secondary | ICD-10-CM | POA: Insufficient documentation

## 2015-04-16 DIAGNOSIS — F17210 Nicotine dependence, cigarettes, uncomplicated: Secondary | ICD-10-CM | POA: Insufficient documentation

## 2015-04-16 DIAGNOSIS — K7031 Alcoholic cirrhosis of liver with ascites: Secondary | ICD-10-CM | POA: Insufficient documentation

## 2015-04-16 DIAGNOSIS — Z79899 Other long term (current) drug therapy: Secondary | ICD-10-CM | POA: Insufficient documentation

## 2015-04-16 LAB — CULTURE, BODY FLUID-BOTTLE

## 2015-04-16 LAB — CULTURE, BLOOD (ROUTINE X 2)
CULTURE: NO GROWTH
CULTURE: NO GROWTH

## 2015-04-16 LAB — CULTURE, BODY FLUID W GRAM STAIN -BOTTLE: Culture: NO GROWTH

## 2015-04-16 NOTE — ED Notes (Signed)
Pt recently seen and admitted to hospital and paracentesis performed for ascites,  Pt is homeless he is having difficulty getting his medications filled,  Also has leg swelling and shortness of breath with increased abdominal pain

## 2015-04-16 NOTE — ED Notes (Signed)
Bed: [id number] Expected date:  Expected time:  Means of arrival:  Comments: EMS 14M Liver failure

## 2015-04-17 ENCOUNTER — Emergency Department (HOSPITAL_COMMUNITY): Payer: Self-pay

## 2015-04-17 LAB — COMPREHENSIVE METABOLIC PANEL
ALBUMIN: 2.4 g/dL — AB (ref 3.5–5.0)
ALK PHOS: 129 U/L — AB (ref 38–126)
ALT: 25 U/L (ref 17–63)
AST: 45 U/L — AB (ref 15–41)
Anion gap: 9 (ref 5–15)
BILIRUBIN TOTAL: 2.2 mg/dL — AB (ref 0.3–1.2)
BUN: 5 mg/dL — AB (ref 6–20)
CALCIUM: 8.3 mg/dL — AB (ref 8.9–10.3)
CO2: 26 mmol/L (ref 22–32)
CREATININE: 0.64 mg/dL (ref 0.61–1.24)
Chloride: 91 mmol/L — ABNORMAL LOW (ref 101–111)
GFR calc Af Amer: >60 mL/min (ref >60)
GLUCOSE: 113 mg/dL — AB (ref 65–99)
POTASSIUM: 3.6 mmol/L (ref 3.5–5.1)
Sodium: 126 mmol/L — ABNORMAL LOW (ref 135–145)
TOTAL PROTEIN: 6.5 g/dL (ref 6.5–8.1)

## 2015-04-17 LAB — CBC WITH DIFFERENTIAL/PLATELET
Basophils Absolute: 0.1 10*3/uL (ref 0.0–0.1)
Basophils Relative: 1 %
EOS PCT: 2 %
Eosinophils Absolute: 0.2 10*3/uL (ref 0.0–0.7)
HEMATOCRIT: 44.9 % (ref 39.0–52.0)
HEMOGLOBIN: 15.9 g/dL (ref 13.0–17.0)
LYMPHS ABS: 2.1 10*3/uL (ref 0.7–4.0)
LYMPHS PCT: 26 %
MCH: 36.5 pg — ABNORMAL HIGH (ref 26.0–34.0)
MCHC: 35.4 g/dL (ref 30.0–36.0)
MCV: 103 fL — AB (ref 78.0–100.0)
MONOS PCT: 18 %
Monocytes Absolute: 1.5 10*3/uL — ABNORMAL HIGH (ref 0.1–1.0)
Neutro Abs: 4.3 10*3/uL (ref 1.7–7.7)
Neutrophils Relative %: 53 %
Platelets: 122 10*3/uL — ABNORMAL LOW (ref 150–400)
RBC: 4.36 MIL/uL (ref 4.22–5.81)
RDW: 13.4 % (ref 11.5–15.5)
WBC: 8.2 10*3/uL (ref 4.0–10.5)

## 2015-04-17 LAB — PROTIME-INR
INR: 1.25 (ref 0.00–1.49)
Prothrombin Time: 15.9 seconds — ABNORMAL HIGH (ref 11.6–15.2)

## 2015-04-17 LAB — APTT: aPTT: 34 seconds (ref 24–37)

## 2015-04-17 LAB — LIPASE, BLOOD: LIPASE: 50 U/L (ref 11–51)

## 2015-04-17 MED ORDER — ONDANSETRON HCL 4 MG/2ML IJ SOLN
4.0000 mg | Freq: Once | INTRAMUSCULAR | Status: AC
  Administered 2015-04-17: 4 mg via INTRAVENOUS
  Filled 2015-04-17: qty 2

## 2015-04-17 MED ORDER — IPRATROPIUM-ALBUTEROL 0.5-2.5 (3) MG/3ML IN SOLN
3.0000 mL | Freq: Once | RESPIRATORY_TRACT | Status: AC
  Administered 2015-04-17: 3 mL via RESPIRATORY_TRACT
  Filled 2015-04-17: qty 3

## 2015-04-17 MED ORDER — MORPHINE SULFATE (PF) 4 MG/ML IV SOLN
4.0000 mg | Freq: Once | INTRAVENOUS | Status: AC
  Administered 2015-04-17: 4 mg via INTRAVENOUS
  Filled 2015-04-17: qty 1

## 2015-04-17 NOTE — ED Notes (Signed)
Made 3rd request for urine,pt tried with no success.

## 2015-04-17 NOTE — ED Notes (Signed)
Made 1st request for urine,pt states he is unable to provide one at this time.

## 2015-04-17 NOTE — ED Notes (Signed)
Pt reminded that we need a urine sample.  

## 2015-04-17 NOTE — ED Provider Notes (Signed)
Assumed care of pt from Dr. Elesa MassedWard pending paracentesis.  This was performed uneventfully with improvement in symptoms.  DC home in stable condition.  Mirian MoMatthew Rylee Huestis, MD 04/17/15 [phone]

## 2015-04-17 NOTE — ED Notes (Signed)
Pt will hold in ED until IR does US paracentesis, which can be later today per Dr Loreta AveWagner whom Dr Elesa MassedWard spoke with , Dr Elesa MassedWard says she will also inform charge pt may hold in ED for hours for paracentesis

## 2015-04-17 NOTE — ED Notes (Addendum)
Radiology called and plans to send for pt at this time. Pt reported that he is unable to void at this time.

## 2015-04-17 NOTE — ED Notes (Signed)
Awake. Verbally responsive. Resp even and unlabored. No audible adventitious breath sounds noted. ABC's intact. Abd soft/distended but tender to palpate. BS (+) and active x4 quadrants. No N/V/D reported. 

## 2015-04-17 NOTE — ED Notes (Signed)
Pt returned to room from radiology without distress noted.

## 2015-04-17 NOTE — ED Notes (Signed)
Pt resting quietly in NAD, vital signs stable

## 2015-04-17 NOTE — ED Notes (Signed)
[name] called from ultrasound stated pt is in line and it may be a while before paracentesis done and he does not have to be NPO for this procedure

## 2015-04-17 NOTE — ED Notes (Signed)
Bugs visible crawling on bed. RN aware.

## 2015-04-17 NOTE — Discharge Instructions (Signed)
Ascites Ascites is a collection of excess fluid in the abdomen. Ascites can range from mild to severe. It can get worse without treatment. CAUSES Possible causes include:  Cirrhosis. This is the most common cause of ascites.  Infection or inflammation in the abdomen.  Cancer in the abdomen.  Heart failure.  Kidney disease.  Inflammation of the pancreas.  Clots in the veins of the liver. SIGNS AND SYMPTOMS Signs and symptoms may include:  A feeling of fullness in your abdomen. This is common.  An increase in the size of your abdomen or your waist.  Swelling in your legs.  Swelling of the scrotum in men.  Difficulty breathing.  Abdominal pain.  Sudden weight gain. If the condition is mild, you may not have symptoms. DIAGNOSIS To make a diagnosis, your health care provider will:  Ask about your medical history.  Perform a physical exam.  Order imaging tests, such as an ultrasound or CT scan of your abdomen. TREATMENT Treatment depends on the cause of the ascites. It may include:  Taking a pill to make you urinate. This is called a water pill (diuretic pill).  Strictly reducing your salt (sodium) intake. Salt can cause extra fluid to be kept in the body, and this makes ascites worse.  Having a procedure to remove fluid from your abdomen (paracentesis).  Having a procedure to transfer fluid from your abdomen into a vein.  Having a procedure that connects two of the major veins within your liver and relieves pressure on your liver (TIPS procedure). Ascites may go away or improve with treatment of the condition that caused it.  HOME CARE INSTRUCTIONS  Keep track of your weight. To do this, weigh yourself at the same time every day and record your weight.  Keep track of how much you drink and any changes in the amount you urinate.  Follow any instructions that your health care provider gives you about how much to drink.  Try not to eat salty (high-sodium)  foods.  Take medicines only as directed by your health care provider.  Keep all follow-up visits as directed by your health care provider. This is important.  Report any changes in your health to your health care provider, especially if you develop new symptoms or your symptoms get worse. SEEK MEDICAL CARE IF:  Your gain more than 3 pounds in 3 days.  Your abdominal size or your waist size increases.  You have new swelling in your legs.  The swelling in your legs gets worse. SEEK IMMEDIATE MEDICAL CARE IF:  You develop a fever.  You develop confusion.  You develop new or worsening difficulty breathing.  You develop new or worsening abdominal pain.  You develop new or worsening swelling in the scrotum (in men).   This information is not intended to replace advice given to you by your health care provider. Make sure you discuss any questions you have with your health care provider.   Document Released: 05/15/2005 Document Revised: 06/05/2014 Document Reviewed: 12/12/2013 Elsevier Interactive Patient Education 2016 Elsevier Inc.  Alcoholic Liver Disease The liver is an organ that converts food into energy, absorbs vitamins from food, removes toxins from the blood, and makes proteins. Alcoholic liver disease happens when the liver becomes damaged due to alcohol consumption and it stops working properly. CAUSES This condition is caused by drinking too much alcohol over a number of years. RISK FACTORS This condition is more likely to develop in:  Women.  People who have a family  history of the disease.  People who have poor nutrition.  People who are obese. SYMPTOMS Early symptoms of this condition include:  Fatigue.  Weakness.  Abdominal discomfort on the upper right side. Symptoms of moderate disease include:  Fever.  Yellow, pale, or darkening skin.  Abdominal pain.  Nausea and vomiting.  Weight loss.  Loss of appetite. Symptoms of advanced disease  include:  Abdominal swelling.  Nosebleeds or bleeding gums.  Itchy skin.  Enlarged fingertips (clubbing).  Light-colored stools that smell very bad (steatorrhea).  Mood changes.  Feeling agitated.  Confusion.  Trouble concentrating. Some people do not have symptoms until the condition becomes severe. Symptoms are often worse after heavy drinking. DIAGNOSIS This condition is diagnosed with:  A physical exam.  Blood tests.  Taking a sample of liver tissue to examine under a microscope (liver biopsy). You may also have other tests, including:   X-rays.  Ultrasound. TREATMENT Treatment may include:  Stopping alcohol use to allow the liver to heal.  Joining a support group or meeting with a counselor.  Medicines to reduce inflammation. These may be recommended if the disease is severe.  A liver transplant. This is the only treatment if the disease is very severe.  Nutritional therapy. This may involve:  Taking vitamins.  Eating foods that are high in thiamine, such as whole-wheat cereals, pork, and raw vegetables.  Eating foods that have a lot of folic acid, such as vegetables, fruits, meats, beans, nuts, and dairy foods.  Eating a diet that includes carbohydrate-rich foods, such as yogurt, beans, potatoes, and rice. HOME CARE INSTRUCTIONS  Do not drink alcohol.  Take medicines only as directed by your health care provider.  Take vitamins only as directed by your health care provider.  Follow any diet instructions that are given to you by your health care provider. SEEK MEDICAL CARE IF:  You have a fever.  You have shortness of breath or have difficulty breathing.  You have bright red blood in your stool, or you have black, tarry stools.  You are vomiting blood.  Your skin color becomes more yellow, pale, or dark.  You develop headaches.  You have trouble thinking.  You have problems balancing or walking.   This information is not intended to  replace advice given to you by your health care provider. Make sure you discuss any questions you have with your health care provider.   Document Released: 06/05/2014 Document Reviewed: 06/05/2014 Elsevier Interactive Patient Education [employer]2016 Elsevier Inc.

## 2015-04-17 NOTE — ED Notes (Signed)
Made 2nd request for urine,pt unable to provide one at this time.

## 2015-04-17 NOTE — ED Notes (Signed)
Pt has black bugs crawling on bed that were killed by Kendal HymenBonnie

## 2015-04-17 NOTE — ED Notes (Signed)
Pt given urinal for urine sample. Pt stated he has not urinated all day. RN aware

## 2015-04-17 NOTE — Procedures (Signed)
Successful US guided paracentesis yielding 6.9 liters of opaque yellow fluid.  No immediate complication.

## 2015-04-17 NOTE — ED Notes (Signed)
Pt has completed breathing treatment , slight improvement in breathing

## 2015-04-17 NOTE — ED Notes (Signed)
Awake. Verbally responsive. A/O x4. Resp even and unlabored. No audible adventitious breath sounds noted. ABC's intact.  

## 2015-04-17 NOTE — ED Provider Notes (Addendum)
By signing my name below, I, Arlan Organ, attest that this documentation has been prepared under the direction and in the presence of Kristen N Ward, DO.  Electronically Signed: Arlan Organ, ED Scribe. 04/17/2015. 1:04 AM.   TIME SEEN: 12:57 AM   CHIEF COMPLAINT:  Chief Complaint  Patient presents with  . Abdominal Pain  . Leg Swelling  . Shortness of Breath     HPI:  HPI Comments: [name] is a 54 y.o. male with a PMHx of cirrhosis of the liver, ETOH abuse, and COPD who presents to the Emergency Department complaining of constant, ongoing abdominal pain x 1 week; worsened today. Pain is described as "swelling". Ongoing abdominal distention, mild shortness of breath, and lower extremity swelling also reported. No aggravating or alleviating factors at this time. 1 episode of vomiting with associated nausea reported this morning. No OTC medications or home remedies attempted prior to arrival. No recent fever, chills, chest pain, or shortness of breath. [name] was admitted to the hospital on 11/12 for ascites. At that time paracentesis performed on 11/13 and he was discharged home with a prescription for Augmentin as he was found to have healthcare associated pneumonia. States he did get this prescription filled yesterday and has been taking this antibiotic. However, pt states he feels like his abdomen is again swollen and uncomfortable. No fever. Pt admits to consuming a beer and a half this evening. No recent follow with a PCP but has follow-up scheduled with health and wellness on November 22. Last previous paracentesis before November 12 was October 31 which happened to be his first paracentesis.  PCP: No primary care provider on file.    ROS: See HPI Constitutional: no fever  Eyes: no drainage  ENT: no runny nose   Cardiovascular:  no chest pain  Resp: Positive SOB  GI: Positive vomiting, abdominal pain, nausea, abdominal distention GU: no dysuria Integumentary: no rash   Allergy: no hives  Musculoskeletal: no leg swelling  Neurological: no slurred speech ROS otherwise negative  PAST MEDICAL HISTORY/PAST SURGICAL HISTORY:  Past Medical History  Diagnosis Date  . ETOH abuse     MEDICATIONS:  Prior to Admission medications   Medication Sig Start Date End Date Taking? Authorizing Provider  amoxicillin-clavulanate (AUGMENTIN XR) 1000-62.5 MG 12 hr tablet Take 2 tablets by mouth 2 (two) times daily. 04/15/15   Elease Etienne, MD  folic acid (FOLVITE) 1 MG tablet Take 1 tablet (1 mg total) by mouth daily. 04/15/15   Elease Etienne, MD  furosemide (LASIX) 40 MG tablet Take 1 tablet (40 mg total) by mouth daily. 04/15/15   Elease Etienne, MD  pantoprazole (PROTONIX) 40 MG tablet Take 1 tablet (40 mg total) by mouth daily. 04/15/15   Elease Etienne, MD  spironolactone (ALDACTONE) 50 MG tablet Take 1 tablet (50 mg total) by mouth daily. 04/15/15   Elease Etienne, MD  thiamine 100 MG tablet Take 1 tablet (100 mg total) by mouth daily. 04/15/15   Elease Etienne, MD    ALLERGIES:  No Known Allergies  SOCIAL HISTORY:  Social History  Substance Use Topics  . Smoking status: Current Every Day Smoker -- 1.00 packs/day for 40 years    Types: Cigarettes  . Smokeless tobacco: Never Used  . Alcohol Use: 4.8 oz/week    8 Cans of beer per week     Comment: drinks daily-drinks whatever is available    FAMILY HISTORY: Family History  Problem Relation Age of  Onset  . Diabetes Mother     EXAM: BP 122/88 mmHg  Pulse 91  Temp(Src) 98 F (36.7 C) (Oral)  Resp 18  SpO2 97% CONSTITUTIONAL: Alert and oriented and responds appropriately to questions. Chronically ill appearing, appears uncomfortable, afebrile  HEAD: Normocephalic EYES: Conjunctivae clear, PERRL ENT: normal nose; no rhinorrhea; moist mucous membranes; pharynx without lesions noted NECK: Supple, no meningismus, no LAD  CARD: RRR; S1 and S2 appreciated; no murmurs, no clicks, no rubs, no  gallops RESP: Normal chest excursion without splinting or tachypnea; very mild mild expiratory wheezing diminished at bases bilateally, no rhonchi, no rales, no hypoxia or respiratory distress, speaking full sentences ABD/GI: Normal bowel sounds; soft, distended with fluid wave, no tympany, tender to palpation diffusely, no rebound, no guarding, no peritoneal signs BACK:  The back appears normal and is non-tender to palpation, there is no CVA tenderness EXT: Normal ROM in all joints; non-tender to palpation; no edema; normal capillary refill; no cyanosis, no calf tenderness. Plus 1 pitting edema in bilateral lower extremities  SKIN: Normal color for age and race; warm NEURO: Moves all extremities equally, sensation to light touch intact diffusely, cranial nerves II through XII intact PSYCH: The patient's mood and manner are appropriate. Grooming and personal hygiene are appropriate.  MEDICAL DECISION MAKING: Patient here with abdominal distention, ascites. He reports that he feel short of breath but has not had any hypoxia. He does have some mild expiratory wheezing and diminished aeration at his bases. Recently treated for HCAP but is on antibiotics. Suspect that his abdominal distention is causing him to feel poorly. I do not think that he has SBP based on his exam. He is not febrile and his abdomen is minimally tender diffusely with fluid wave. I suspect patient needs another therapeutic paracentesis. We'll repeat labs today to ensure there is no significant change in the chest x-ray. We'll give him pain medication.  ED PROGRESS: Patient's labs show hyponatremia with sodium of 126 which appears stable from baseline likely from continued alcohol abuse. LFTs, coags also baseline. No leukocytosis. Chest x-ray shows improving left lower lobe pneumonia. Have advised him to continue his antibiotics. I do not feel at this time patient needs admission but rather a therapeutic paracentesis. Have discussed with  interventional radiologist Dr. Loreta Ave. Will Place order to have this test done today. He states that they will place patient on the schedule to have a therapeutic paracentesis. Patient is comfortable with waiting in the emergency department to have this test done. He does have outpatient PCP follow-up scheduled for November 22. I do not feel this time he has any indication to need admission to the hospital. Have discussed with patient at length that he needs to quit drinking alcohol.    EKG Interpretation  Date/Time:  Saturday April 17 2015 01:53:11 EST Ventricular Rate:  85 PR Interval:  142 QRS Duration: 104 QT Interval:  402 QTC Calculation: 478 R Axis:   86 Text Interpretation:  Sinus rhythm Borderline prolonged QT interval Baseline wander in lead(s) V2 V3 Confirmed by WARD,  DO, KRISTEN ([id number]) on 04/17/2015 2:50:00 AM       9:00 AM  Radiology plans to take pt to have therapeutic paracentesis soon. Plan will be to discharge from the emergency department afterwards. Signed out to Dr. Littie Deeds.    I personally performed the services described in this documentation, which was scribed in my presence. The recorded information has been reviewed and is accurate.   Layla Maw Ward, DO  04/17/15 0801  Layla MawKristen N Ward, DO 04/17/15 [id number]

## 2015-04-20 ENCOUNTER — Ambulatory Visit: Payer: Self-pay | Attending: Family Medicine | Admitting: Family Medicine

## 2015-04-20 ENCOUNTER — Encounter (HOSPITAL_COMMUNITY): Payer: Self-pay

## 2015-04-20 ENCOUNTER — Inpatient Hospital Stay: Payer: Self-pay | Admitting: Family Medicine

## 2015-04-20 ENCOUNTER — Inpatient Hospital Stay (HOSPITAL_COMMUNITY)
Admission: EM | Admit: 2015-04-20 | Discharge: 2015-04-25 | DRG: 432 | Disposition: A | Discharge status: Home / Self Care | Payer: Self-pay | Attending: Internal Medicine | Admitting: Internal Medicine

## 2015-04-20 ENCOUNTER — Encounter: Payer: Self-pay | Admitting: Family Medicine

## 2015-04-20 VITALS — BP 119/79 | HR 94 | Temp 97.6°F | Resp 16 | Ht 73.0 in | Wt 192.0 lb

## 2015-04-20 DIAGNOSIS — D7589 Other specified diseases of blood and blood-forming organs: Secondary | ICD-10-CM | POA: Diagnosis present

## 2015-04-20 DIAGNOSIS — J189 Pneumonia, unspecified organism: Secondary | ICD-10-CM

## 2015-04-20 DIAGNOSIS — Z515 Encounter for palliative care: Secondary | ICD-10-CM | POA: Diagnosis present

## 2015-04-20 DIAGNOSIS — R600 Localized edema: Secondary | ICD-10-CM

## 2015-04-20 DIAGNOSIS — E876 Hypokalemia: Secondary | ICD-10-CM | POA: Diagnosis present

## 2015-04-20 DIAGNOSIS — F1010 Alcohol abuse, uncomplicated: Secondary | ICD-10-CM | POA: Diagnosis present

## 2015-04-20 DIAGNOSIS — R339 Retention of urine, unspecified: Secondary | ICD-10-CM | POA: Diagnosis present

## 2015-04-20 DIAGNOSIS — R140 Abdominal distension (gaseous): Secondary | ICD-10-CM | POA: Insufficient documentation

## 2015-04-20 DIAGNOSIS — Z5189 Encounter for other specified aftercare: Secondary | ICD-10-CM | POA: Insufficient documentation

## 2015-04-20 DIAGNOSIS — K7290 Hepatic failure, unspecified without coma: Secondary | ICD-10-CM | POA: Diagnosis present

## 2015-04-20 DIAGNOSIS — K7011 Alcoholic hepatitis with ascites: Secondary | ICD-10-CM

## 2015-04-20 DIAGNOSIS — E8809 Other disorders of plasma-protein metabolism, not elsewhere classified: Secondary | ICD-10-CM | POA: Diagnosis present

## 2015-04-20 DIAGNOSIS — R1900 Intra-abdominal and pelvic swelling, mass and lump, unspecified site: Secondary | ICD-10-CM | POA: Insufficient documentation

## 2015-04-20 DIAGNOSIS — D6959 Other secondary thrombocytopenia: Secondary | ICD-10-CM | POA: Diagnosis present

## 2015-04-20 DIAGNOSIS — R0602 Shortness of breath: Secondary | ICD-10-CM | POA: Insufficient documentation

## 2015-04-20 DIAGNOSIS — Z79899 Other long term (current) drug therapy: Secondary | ICD-10-CM | POA: Insufficient documentation

## 2015-04-20 DIAGNOSIS — K859 Acute pancreatitis without necrosis or infection, unspecified: Secondary | ICD-10-CM | POA: Diagnosis present

## 2015-04-20 DIAGNOSIS — R05 Cough: Secondary | ICD-10-CM | POA: Insufficient documentation

## 2015-04-20 DIAGNOSIS — Z720 Tobacco use: Secondary | ICD-10-CM

## 2015-04-20 DIAGNOSIS — Z7189 Other specified counseling: Secondary | ICD-10-CM | POA: Insufficient documentation

## 2015-04-20 DIAGNOSIS — E871 Hypo-osmolality and hyponatremia: Secondary | ICD-10-CM | POA: Diagnosis present

## 2015-04-20 DIAGNOSIS — J690 Pneumonitis due to inhalation of food and vomit: Secondary | ICD-10-CM | POA: Diagnosis present

## 2015-04-20 DIAGNOSIS — R188 Other ascites: Secondary | ICD-10-CM

## 2015-04-20 DIAGNOSIS — K7030 Alcoholic cirrhosis of liver without ascites: Secondary | ICD-10-CM | POA: Insufficient documentation

## 2015-04-20 DIAGNOSIS — R9431 Abnormal electrocardiogram [ECG] [EKG]: Secondary | ICD-10-CM | POA: Diagnosis present

## 2015-04-20 DIAGNOSIS — K7031 Alcoholic cirrhosis of liver with ascites: Principal | ICD-10-CM | POA: Diagnosis present

## 2015-04-20 DIAGNOSIS — F17210 Nicotine dependence, cigarettes, uncomplicated: Secondary | ICD-10-CM | POA: Diagnosis present

## 2015-04-20 DIAGNOSIS — R109 Unspecified abdominal pain: Secondary | ICD-10-CM

## 2015-04-20 DIAGNOSIS — F1020 Alcohol dependence, uncomplicated: Secondary | ICD-10-CM | POA: Diagnosis present

## 2015-04-20 DIAGNOSIS — D696 Thrombocytopenia, unspecified: Secondary | ICD-10-CM | POA: Diagnosis present

## 2015-04-20 DIAGNOSIS — Y95 Nosocomial condition: Secondary | ICD-10-CM | POA: Diagnosis present

## 2015-04-20 DIAGNOSIS — M7989 Other specified soft tissue disorders: Secondary | ICD-10-CM | POA: Insufficient documentation

## 2015-04-20 HISTORY — DX: Unspecified cirrhosis of liver: K74.60

## 2015-04-20 LAB — CBC
HCT: 38.7 % — ABNORMAL LOW (ref 39.0–52.0)
Hemoglobin: 13.8 g/dL (ref 13.0–17.0)
MCH: 35.5 pg — ABNORMAL HIGH (ref 26.0–34.0)
MCHC: 35.7 g/dL (ref 30.0–36.0)
MCV: 99.5 fL (ref 78.0–100.0)
Platelets: 180 10*3/uL (ref 150–400)
RBC: 3.89 MIL/uL — ABNORMAL LOW (ref 4.22–5.81)
RDW: 12.7 % (ref 11.5–15.5)
WBC: 9.6 10*3/uL (ref 4.0–10.5)

## 2015-04-20 LAB — COMPREHENSIVE METABOLIC PANEL
ALT: 22 U/L (ref 17–63)
AST: 37 U/L (ref 15–41)
Albumin: 2.1 g/dL — ABNORMAL LOW (ref 3.5–5.0)
Alkaline Phosphatase: 122 U/L (ref 38–126)
Anion gap: 6 (ref 5–15)
BUN: 9 mg/dL (ref 6–20)
CO2: 24 mmol/L (ref 22–32)
Calcium: 7.7 mg/dL — ABNORMAL LOW (ref 8.9–10.3)
Chloride: 88 mmol/L — ABNORMAL LOW (ref 101–111)
Creatinine, Ser: 0.64 mg/dL (ref 0.61–1.24)
GFR calc Af Amer: >60 mL/min (ref >60)
GFR calc non Af Amer: >60 mL/min (ref >60)
Glucose, Bld: 136 mg/dL — ABNORMAL HIGH (ref 65–99)
Potassium: 3.6 mmol/L (ref 3.5–5.1)
Sodium: 118 mmol/L — CL (ref 135–145)
Total Bilirubin: 1.7 mg/dL — ABNORMAL HIGH (ref 0.3–1.2)
Total Protein: 5.9 g/dL — ABNORMAL LOW (ref 6.5–8.1)

## 2015-04-20 LAB — LIPASE, BLOOD: Lipase: 63 U/L — ABNORMAL HIGH (ref 11–51)

## 2015-04-20 LAB — AMMONIA: Ammonia: 38 umol/L (ref 16–53)

## 2015-04-20 MED ORDER — LACTULOSE 10 GM/15ML PO SOLN: 10.0000 g | Freq: Two times a day (BID) | ORAL | Status: DC

## 2015-04-20 MED ORDER — LORAZEPAM 1 MG PO TABS: 0.0000 mg | ORAL_TABLET | Freq: Two times a day (BID) | ORAL | Status: DC

## 2015-04-20 MED ORDER — VITAMIN B-1 100 MG PO TABS
100.0000 mg | ORAL_TABLET | Freq: Every day | ORAL | Status: DC
  Filled 2015-04-20: qty 1

## 2015-04-20 MED ORDER — LORAZEPAM 2 MG/ML IJ SOLN: 0.0000 mg | Freq: Two times a day (BID) | INTRAMUSCULAR | Status: AC

## 2015-04-20 MED ORDER — LORAZEPAM 1 MG PO TABS
0.0000 mg | ORAL_TABLET | Freq: Four times a day (QID) | ORAL | Status: DC
Start: 2015-04-21 — End: 2015-04-21

## 2015-04-20 MED ORDER — LORAZEPAM 2 MG/ML IJ SOLN: 0.0000 mg | Freq: Four times a day (QID) | INTRAMUSCULAR | Status: AC

## 2015-04-20 MED ORDER — LIDOCAINE-EPINEPHRINE 2 %-1:100000 IJ SOLN
20.0000 mL | Freq: Once | INTRAMUSCULAR | Status: AC
Start: 2015-04-20 — End: 2015-04-20
  Administered 2015-04-20: 20 mL via INTRADERMAL
  Filled 2015-04-20: qty 1

## 2015-04-20 MED ORDER — HYDROMORPHONE HCL 1 MG/ML IJ SOLN
1.0000 mg | Freq: Once | INTRAMUSCULAR | Status: AC
  Administered 2015-04-20: 1 mg via INTRAVENOUS
  Filled 2015-04-20: qty 1

## 2015-04-20 MED ORDER — AMOXICILLIN-POT CLAVULANATE 875-125 MG PO TABS: 1.0000 | ORAL_TABLET | Freq: Two times a day (BID) | ORAL | Status: DC

## 2015-04-20 MED ORDER — SODIUM CHLORIDE 0.9 % IV BOLUS (SEPSIS)
1000.0000 mL | Freq: Once | INTRAVENOUS | Status: AC
  Administered 2015-04-20: 1000 mL via INTRAVENOUS

## 2015-04-20 MED ORDER — FUROSEMIDE 40 MG PO TABS: 40.0000 mg | ORAL_TABLET | Freq: Every day | ORAL | Status: DC

## 2015-04-20 MED ORDER — THIAMINE HCL 100 MG/ML IJ SOLN: 100.0000 mg | Freq: Every day | INTRAMUSCULAR | Status: DC

## 2015-04-20 NOTE — Progress Notes (Signed)
CC: Follow-up from hospitalization  HPI: [name] is a 54 y.o. male with a history of alcoholic liver cirrhosis (diagnosed in 02/2015) hospitalized from 10/14-10/17 and 10/30-10/31 status post 2.5 initially and 4.6 L of therapeutic paracentesis, negative for spontaneous bacterial peritonitis and again from 04/10/15-04/15/15 after he presented to [facility] ED with worsening abdominal distention and cough.  He had 1 L abdominal paracentesis in the ED followed by 6 L by IR [name] 11/12, CT fluid culture and blood cultures were no growth to date. He was treated for left lower lobe healthcare associated pneumonia with IV vancomycin and Zosyn which was later transitioned to oral Augmentin and he had replacement of his electrolytes. He had an ED presentation again [name] 04/17/15 with abdominal pain, swelling and shortness of breath where he had an ultrasound-guided abdominal paracentesis yielding 6.9 L of fluid.  Today he reports his abdomen is swollen but not as much as it had been when he presented to the ED and he denies any shortness of breath. He has been unable to pick up some of his medications due to cost and this includes Lasix. He complains of pain and swelling in both legs.  No Known Allergies Past Medical History  Diagnosis Date  . ETOH abuse    Current Outpatient Prescriptions [name] File Prior to Visit  Medication Sig Dispense Refill  . albuterol (PROVENTIL HFA;VENTOLIN HFA) 108 (90 BASE) MCG/ACT inhaler Inhale into the lungs every 6 (six) hours as needed for wheezing or shortness of breath.    . folic acid (FOLVITE) 1 MG tablet Take 1 tablet (1 mg total) by mouth daily. 30 tablet 0  . pantoprazole (PROTONIX) 40 MG tablet Take 1 tablet (40 mg total) by mouth daily. 30 tablet 0  . spironolactone (ALDACTONE) 50 MG tablet Take 1 tablet (50 mg total) by mouth daily. 30 tablet 0  . thiamine 100 MG tablet Take 1 tablet (100 mg total) by mouth daily. 30 tablet 0   No current  facility-administered medications [name] file prior to visit.   Family History  Problem Relation Age of Onset  . Diabetes Mother    Social History   Social History  . Marital Status: Single    Spouse Name: N/A  . Number of Children: N/A  . Years of Education: N/A   Occupational History  . Not [name] file.   Social History Main Topics  . Smoking status: Current Every Day Smoker -- 1.00 packs/day for 40 years    Types: Cigarettes  . Smokeless tobacco: Never Used  . Alcohol Use: 4.8 oz/week    8 Cans of beer per week     Comment: drinks daily-drinks whatever is available  . Drug Use: No  . Sexual Activity: No   Other Topics Concern  . Not [name] file   Social History Narrative    Review of Systems: Constitutional: Negative for fever, chills, diaphoresis, activity change, appetite change and fatigue. HENT: Negative for ear pain, nosebleeds, congestion, facial swelling, rhinorrhea, neck pain, neck stiffness and ear discharge.  Eyes: Negative for pain, discharge, redness, itching and visual disturbance. Respiratory: Negative for cough, choking, chest tightness, shortness of breath, wheezing and stridor.  Cardiovascular: Negative for chest pain, palpitations and positive for leg swelling. Gastrointestinal: Positive for abdominal distention. Genitourinary: Negative for dysuria, urgency, frequency, hematuria, flank pain, decreased urine volume, difficulty urinating and dyspareunia.  Musculoskeletal: Negative for back pain, joint swelling, arthralgias and gait problem. Neurological: Negative for dizziness, tremors, seizures, syncope, facial asymmetry, speech  difficulty, weakness, light-headedness, numbness and headaches.  Hematological: Negative for adenopathy. Does not bruise/bleed easily. Psychiatric/Behavioral: Negative for hallucinations, behavioral problems, confusion, dysphoric mood, decreased concentration and agitation.    Objective:   Filed Vitals:   04/20/15 1003  BP: 119/79    Pulse: 94  Temp: 97.6 F (36.4 C)  Resp: 16    Physical Exam: Constitutional: Patient appears, chronically ill looking HENT: Normocephalic, atraumatic, External right and left ear normal. Oropharynx is clear and moist.  Eyes: mild scleral icterus. Neck: Normal ROM. Neck supple. No JVD. No tracheal deviation. No thyromegaly. CVS: RRR, S1/S2 +, no murmurs, no gallops, no carotid bruit.  Pulmonary: Effort and breath sounds normal, no stridor, rhonchi, wheezes, rales.  Abdominal: Massive ascites  Musculoskeletal: 3+ bilateral pedal edema  Lymphadenopathy: No lymphadenopathy noted, cervical, inguinal or axillary Neuro: Alert. Normal reflexes, muscle tone coordination. No cranial nerve deficit. Skin: Skin is warm and dry. No rash noted. Not diaphoretic. No erythema. No pallor. Psychiatric: Normal mood and affect. Behavior, judgment, thought content normal.  Lab Results  Component Value Date   WBC 8.2 04/17/2015   HGB 15.9 04/17/2015   HCT 44.9 04/17/2015   MCV 103.0* 04/17/2015   PLT 122* 04/17/2015   Lab Results  Component Value Date   CREATININE 0.64 04/17/2015   BUN 5* 04/17/2015   NA 126* 04/17/2015   K 3.6 04/17/2015   CL 91* 04/17/2015   CO2 26 04/17/2015       Assessment and plan:  Liver cirrhosis with hepatitis: MELD score (mayo) = 8 Ammonia level sent off. Commenced [name] lactulose today He will likely need an abdominal paracentesis as his next visit as well as repeat labs I have emphasized the need to [facility] so we can refer him to GI for optimization of management.  Pedal edema: He has not been taking Lasix due to cost. I have refilled this and advised him to elevate his legs.  Healthcare associated pneumonia: He never picked up his Augmentin prescription due to cost.  I have refilled that today. CXR in 1 month to evaluate for resolution  Tobacco abuse: Spent 3 minutes counseling [name] cessation and he is not ready to quit yet.  This note  has been created with Education officer, environmentalDragon speech recognition software and smart phrase technology. Any transcriptional errors are unintentional.         Jaclyn ShaggyEnobong, [name], MD. [facility] [phone] 04/20/2015, 10:34 AM

## 2015-04-20 NOTE — Progress Notes (Signed)
Pt's here for hospital f/up for pneumonia. Pt reports feeling sore. Pain is located on pt right leg and swelling rated at 10/10. Describes pain as dull, aching, sharp, pain.  Pt was recently Dx with Cirrhoris x2wks ago. Pt would like to est. care with PCP.  Pt has had the flu shot and pneumonia shot.

## 2015-04-20 NOTE — ED Notes (Addendum)
PT RECEIVED VIA EMS C/O RUQ PAIN X4 DAYS. DENIES N/V/D. PT HAS A HX OF LIVER CIRRHOSIS, AND HE IS DUE FOR A PARACENTESIS ON Tuesday.

## 2015-04-20 NOTE — ED Provider Notes (Signed)
CSN: [id number]     Arrival date & time 04/20/15  2125 History   First MD Initiated Contact with Patient 04/20/15 2246     Chief Complaint  Patient presents with  . Abdominal Pain    RUQ X4 DAYS     (Consider location/radiation/quality/duration/timing/severity/associated sxs/prior Treatment) Patient is a 54 y.o. male presenting with abdominal pain. The history is provided by the patient.  Abdominal Pain Pain location:  Generalized Pain quality: aching and pressure   Pain radiates to:  Does not radiate Pain severity:  Severe Onset quality:  Gradual Duration:  3 days Timing:  Constant Progression:  Worsening Chronicity:  New Context comment:  Large ascities Relieved by:  Nothing Worsened by:  Palpation and movement Associated symptoms: fatigue   Associated symptoms: no anorexia, no belching, no chest pain, no chills, no constipation, no diarrhea, no fever, no hematemesis and no nausea   Risk factors: alcohol abuse    This is a 54 year old male with a past medical history of alcoholic cirrhosis who presents emergency Department with chief complaint of abdominal pain. The patient last had a paracentesis 5 days ago. He states that he immediately began swelling again after discharge. States that over the past 2 days. He's had severe abdominal pain and tenderness. He denies any or chills. He states that he has been having difficulty urinating. He states he cannot remember the last time he did urinate. He has been making regular bowel movements. His abdominal pain is generalized, aching, tender to palpation, worse with movement. Past Medical History  Diagnosis Date  . ETOH abuse   . Cirrhosis of liver [facility])    Past Surgical History  Procedure Laterality Date  . No past surgeries      jaw surgery 69yrs ago   Family History  Problem Relation Age of Onset  . Diabetes Mother    Social History  Substance Use Topics  . Smoking status: Current Every Day Smoker -- 1.00 packs/day for 40  years    Types: Cigarettes  . Smokeless tobacco: Never Used  . Alcohol Use: 4.8 oz/week    8 Cans of beer per week     Comment: drinks daily-drinks whatever is available    Review of Systems  Constitutional: Positive for fatigue. Negative for fever and chills.  Cardiovascular: Negative for chest pain.  Gastrointestinal: Positive for abdominal pain. Negative for nausea, diarrhea, constipation, anorexia and hematemesis.  All other systems reviewed and are negative.     Allergies  Review of patient's allergies indicates no known allergies.  Home Medications   Prior to Admission medications   Medication Sig Start Date End Date Taking? Authorizing Provider  albuterol (PROVENTIL HFA;VENTOLIN HFA) 108 (90 BASE) MCG/ACT inhaler Inhale into the lungs every 6 (six) hours as needed for wheezing or shortness of breath.   Yes Historical Provider, MD  folic acid (FOLVITE) 1 MG tablet Take 1 tablet (1 mg total) by mouth daily. 04/15/15  Yes Elease Etienne, MD  pantoprazole (PROTONIX) 40 MG tablet Take 1 tablet (40 mg total) by mouth daily. 04/15/15  Yes Elease Etienne, MD  spironolactone (ALDACTONE) 50 MG tablet Take 1 tablet (50 mg total) by mouth daily. 04/15/15  Yes Elease Etienne, MD  amoxicillin-clavulanate (AUGMENTIN) 875-125 MG tablet Take 1 tablet by mouth 2 (two) times daily. Patient not taking: Reported on 04/21/2015 04/20/15   [name], MD  furosemide (LASIX) 40 MG tablet Take 1 tablet (40 mg total) by mouth daily. Patient not taking: Reported  on 04/21/2015 04/20/15   [name], MD  lactulose (CHRONULAC) 10 GM/15ML solution Take 15 mLs (10 g total) by mouth 2 (two) times daily. Patient not taking: Reported on 04/21/2015 04/20/15   [name], MD  thiamine 100 MG tablet Take 1 tablet (100 mg total) by mouth daily. Patient not taking: Reported on 04/21/2015 04/15/15   Elease Etienne, MD   BP 111/76 mmHg  Pulse 100  Temp(Src) 97.6 F (36.4 C) (Oral)  Resp 16  Ht 6'  1" (1.854 m)  Wt 75.6 kg  BMI 21.99 kg/m2  SpO2 97% Physical Exam  Constitutional: He appears well-developed and well-nourished. No distress.  HENT:  Head: Normocephalic and atraumatic.  Eyes: Conjunctivae are normal. No scleral icterus.  Neck: Normal range of motion. Neck supple.  Cardiovascular: Normal rate, regular rhythm and normal heart sounds.   3+pitting edema  Pulmonary/Chest: Effort normal and breath sounds normal. No respiratory distress.  Abdominal: Soft. He exhibits distension and ascites. There is tenderness.    Musculoskeletal: He exhibits no edema.  Neurological: He is alert.  Skin: Skin is warm and dry. He is not diaphoretic.  Psychiatric: His behavior is normal.  Nursing note and vitals reviewed.   ED Course  .Paracentesis Date/Time: 04/20/2015 11:59 PM Performed by: Arthor Captain Authorized by: Arthor Captain Consent: Verbal consent obtained. Risks and benefits: risks, benefits and alternatives were discussed Consent given by: patient Patient identity confirmed: provided demographic data Time out: Immediately prior to procedure a "time out" was called to verify the correct patient, procedure, equipment, support staff and site/side marked as required. Initial or subsequent exam: initial Procedure purpose: diagnostic and therapeutic Indications: abdominal discomfort secondary to ascites and suspected peritonitis Anesthesia: local infiltration Local anesthetic: lidocaine 1% without epinephrine Anesthetic total: 5 ml Preparation: Patient was prepped and draped in the usual sterile fashion. Needle gauge: 18 Ultrasound guidance: yes Puncture site: right lower quadrant Fluid removed: 2.3(ml) Fluid appearance: clear Dressing: 4x4 sterile gauze Patient tolerance: Patient tolerated the procedure well with no immediate complications   (including critical care time) Labs Review Labs Reviewed  LIPASE, BLOOD - Abnormal; Notable for the following:    Lipase  63 (*)    All other components within normal limits  COMPREHENSIVE METABOLIC PANEL - Abnormal; Notable for the following:    Sodium 118 (*)    Chloride 88 (*)    Glucose, Bld 136 (*)    Calcium 7.7 (*)    Total Protein 5.9 (*)    Albumin 2.1 (*)    Total Bilirubin 1.7 (*)    All other components within normal limits  CBC - Abnormal; Notable for the following:    RBC 3.89 (*)    HCT 38.7 (*)    MCH 35.5 (*)    All other components within normal limits  URINALYSIS, ROUTINE W REFLEX MICROSCOPIC (NOT AT [facility]) - Abnormal; Notable for the following:    Color, Urine AMBER (*)    APPearance CLOUDY (*)    Leukocytes, UA TRACE (*)    All other components within normal limits  MAGNESIUM - Abnormal; Notable for the following:    Magnesium 1.5 (*)    All other components within normal limits  BODY FLUID CELL COUNT WITH DIFFERENTIAL - Abnormal; Notable for the following:    Appearance, Fluid CLOUDY (*)    Monocyte-Macrophage-Serous Fluid 42 (*)    All other components within normal limits  BASIC METABOLIC PANEL - Abnormal; Notable for the following:    Sodium 121 (*)  Potassium 3.3 (*)    Chloride 88 (*)    Glucose, Bld 110 (*)    Calcium 7.8 (*)    Anion gap 4 (*)    All other components within normal limits  BASIC METABOLIC PANEL - Abnormal; Notable for the following:    Sodium 120 (*)    Potassium 3.2 (*)    Chloride 88 (*)    Glucose, Bld 154 (*)    Calcium 7.6 (*)    Anion gap 4 (*)    All other components within normal limits  CBC - Abnormal; Notable for the following:    RBC 3.72 (*)    HCT 37.8 (*)    MCV 101.6 (*)    MCH 36.0 (*)    Platelets 145 (*)    All other components within normal limits  PREALBUMIN - Abnormal; Notable for the following:    Prealbumin 2.6 (*)    All other components within normal limits  URINE MICROSCOPIC-ADD ON - Abnormal; Notable for the following:    Squamous Epithelial / LPF 0-5 (*)    Bacteria, UA RARE (*)    All other components  within normal limits  MAGNESIUM - Abnormal; Notable for the following:    Magnesium 1.6 (*)    All other components within normal limits  CBC - Abnormal; Notable for the following:    RBC 3.50 (*)    Hemoglobin 12.1 (*)    HCT 35.0 (*)    MCH 34.6 (*)    Platelets 121 (*)    All other components within normal limits  COMPREHENSIVE METABOLIC PANEL - Abnormal; Notable for the following:    Sodium 121 (*)    Chloride 91 (*)    Glucose, Bld 107 (*)    Calcium 7.6 (*)    Total Protein 4.9 (*)    Albumin 1.7 (*)    Total Bilirubin 1.4 (*)    Anion gap 4 (*)    All other components within normal limits  LIPASE, BLOOD - Abnormal; Notable for the following:    Lipase 57 (*)    All other components within normal limits  CULTURE, BODY FLUID-BOTTLE  GRAM STAIN  CULTURE, BLOOD (ROUTINE X 2)  CULTURE, BLOOD (ROUTINE X 2)  URINE CULTURE  CULTURE, EXPECTORATED SPUTUM-ASSESSMENT  GRAM STAIN  LACTATE DEHYDROGENASE, BODY FLUID  GLUCOSE, PERITONEAL FLUID  PROTEIN, BODY FLUID  ALBUMIN, FLUID  ETHANOL  STREP PNEUMONIAE URINARY ANTIGEN  MAGNESIUM  LEGIONELLA PNEUMOPHILA SEROGP 1 UR AG    Imaging Review No results found. I have personally reviewed and evaluated these images and lab results as part of my medical decision-making.   EKG Interpretation None      MDM   Final diagnoses:  Hyponatremia  Ascites    Patient with Sig hyponatremia.He is alert and oriented. His abdomen is tender, warm, and red. I have concern for possible SBP. Paracentesis performed. Patient suddenly became nauseated and began vomiting. His body fluid analysis is pending.   Patient will be admitted .   Arthor Captainbigail Effrey Davidow, PA-C 04/22/15 1012  Raeford RazorStephen Kohut, MD 05/01/15 [phone]

## 2015-04-20 NOTE — Patient Instructions (Signed)
Cirrhosis °Cirrhosis is long-term (chronic) liver injury. The liver is your largest internal organ, and it performs many functions. The liver converts food into energy, removes toxic material from your blood, makes important proteins, and absorbs necessary vitamins from your diet. °If you have cirrhosis, it means many of your healthy liver cells have been replaced by scar tissue. This prevents blood from flowing through your liver, which makes it difficult for your liver to function. This scarring is not reversible, but treatment can prevent it from getting worse.  °CAUSES  °Hepatitis C and long-term alcohol abuse are the most common causes of cirrhosis. Other causes include: °· Nonalcoholic fatty liver disease. °· Hepatitis B infection. °· Autoimmune hepatitis. °· Diseases that cause blockage of ducts inside the liver. °· Inherited liver diseases. °· Reactions to certain long-term medicines. °· Parasitic infections. °· Long-term exposure to certain toxins. °RISK FACTORS °You may have a higher risk of cirrhosis if you: °· Have certain hepatitis viruses. °· Abuse alcohol, especially if you are male. °· Are overweight. °· Share needles. °· Have unprotected sex with someone who has hepatitis. °SYMPTOMS  °You may not have any signs and symptoms at first. Symptoms may not develop until the damage to your liver starts to get worse. Signs and symptoms of cirrhosis may include:  °· Tenderness in the right-upper part of your abdomen. °· Weakness and tiredness (fatigue). °· Loss of appetite. °· Nausea. °· Weight loss and muscle loss. °· Itchiness. °· Yellow skin and eyes (jaundice). °· Buildup of fluid in the abdomen (ascites). °· Swelling of the feet and ankles (edema). °· Appearance of tiny blood vessels under the skin. °· Mental confusion. °· Easy bruising and bleeding. °DIAGNOSIS  °Your health care provider may suspect cirrhosis based on your symptoms and medical history, especially if you have other medical conditions  or a history of alcohol abuse. Your health care provider will do a physical exam to feel your liver and check for signs of cirrhosis. Your health care provider may perform other tests, including:  °· Blood tests to check:   °¨ Whether you have hepatitis B or C.   °¨ Kidney function. °¨ Liver function. °· Imaging tests such as: °¨ MRI or CT scan to look for changes seen in advanced cirrhosis. °¨ Ultrasound to see if normal liver tissue is being replaced by scar tissue. °· A procedure using a long needle to take a sample of liver tissue (biopsy) for examination under a microscope. Liver biopsy can confirm the diagnosis of cirrhosis.   °TREATMENT  °Treatment depends on how damaged your liver is and what caused the damage. Treatment may include treating cirrhosis symptoms or treating the underlying causes of the condition to try to slow the progression of the damage. Treatment may include: °· Making lifestyle changes, such as:   °¨ Eating a healthy diet. °¨ Restricting salt intake.  °¨ Maintaining a healthy weight.   °¨ Not abusing drugs or alcohol. °· Taking medicines to: °¨ Treat liver infections or other infections. °¨ Control itching. °¨ Reduce fluid buildup. °¨ Reduce certain blood toxins. °¨ Reduce risk of bleeding from enlarged blood vessels in the stomach or esophagus (varices). °· If varices are causing bleeding problems, you may need treatment with a procedure that ties up the vessels causing them to fall off (band ligation). °· If cirrhosis is causing your liver to fail, your health care provider may recommend a liver transplant. °· Other treatments may be recommended depending on any complications of cirrhosis, such as liver-related kidney failure (hepatorenal   syndrome). °HOME CARE INSTRUCTIONS  °· Take medicines only as directed by your health care provider. Do not use drugs that are toxic to your liver. Ask your health care provider before taking any new medicines, including over-the-counter medicines.    °· Rest as needed. °· Eat a well-balanced diet. Ask your health care provider or dietitian for more information.   °· You may have to follow a low-salt diet or restrict your water intake as directed. °· Do not drink alcohol. This is especially important if you are taking acetaminophen. °· Keep all follow-up visits as directed by your health care provider. This is important. °SEEK MEDICAL CARE IF: °· You have fatigue or weakness that is getting worse. °· You develop swelling of the hands, feet, legs, or face. °· You have a fever. °· You develop loss of appetite. °· You have nausea or vomiting. °· You develop jaundice. °· You develop easy bruising or bleeding. °SEEK IMMEDIATE MEDICAL CARE IF: °· You vomit bright red blood or a material that looks like coffee grounds. °· You have blood in your stools. °· Your stools appear black and tarry. °· You become confused. °· You have chest pain or trouble breathing. °  °This information is not intended to replace advice given to you by your health care provider. Make sure you discuss any questions you have with your health care provider. °  °Document Released: 05/15/2005 Document Revised: 06/05/2014 Document Reviewed: 01/21/2014 °Elsevier Interactive Patient Education ©2016 [employer] ° °

## 2015-04-21 DIAGNOSIS — J189 Pneumonia, unspecified organism: Secondary | ICD-10-CM | POA: Diagnosis present

## 2015-04-21 DIAGNOSIS — E8809 Other disorders of plasma-protein metabolism, not elsewhere classified: Secondary | ICD-10-CM

## 2015-04-21 DIAGNOSIS — I4581 Long QT syndrome: Secondary | ICD-10-CM

## 2015-04-21 DIAGNOSIS — Z720 Tobacco use: Secondary | ICD-10-CM

## 2015-04-21 DIAGNOSIS — E871 Hypo-osmolality and hyponatremia: Secondary | ICD-10-CM

## 2015-04-21 DIAGNOSIS — R9431 Abnormal electrocardiogram [ECG] [EKG]: Secondary | ICD-10-CM | POA: Diagnosis present

## 2015-04-21 DIAGNOSIS — K7290 Hepatic failure, unspecified without coma: Secondary | ICD-10-CM | POA: Diagnosis present

## 2015-04-21 LAB — BASIC METABOLIC PANEL
ANION GAP: 4 — AB (ref 5–15)
ANION GAP: 4 — AB (ref 5–15)
BUN: 9 mg/dL (ref 6–20)
BUN: 8 mg/dL (ref 6–20)
CALCIUM: 7.8 mg/dL — AB (ref 8.9–10.3)
CALCIUM: 7.6 mg/dL — AB (ref 8.9–10.3)
CO2: 29 mmol/L (ref 22–32)
CO2: 28 mmol/L (ref 22–32)
CREATININE: 0.62 mg/dL (ref 0.61–1.24)
CREATININE: 0.62 mg/dL (ref 0.61–1.24)
Chloride: 88 mmol/L — ABNORMAL LOW (ref 101–111)
Chloride: 88 mmol/L — ABNORMAL LOW (ref 101–111)
GFR calc Af Amer: >60 mL/min (ref >60)
GFR calc Af Amer: >60 mL/min (ref >60)
GLUCOSE: 110 mg/dL — AB (ref 65–99)
GLUCOSE: 154 mg/dL — AB (ref 65–99)
Potassium: 3.3 mmol/L — ABNORMAL LOW (ref 3.5–5.1)
Potassium: 3.2 mmol/L — ABNORMAL LOW (ref 3.5–5.1)
Sodium: 121 mmol/L — ABNORMAL LOW (ref 135–145)
Sodium: 120 mmol/L — ABNORMAL LOW (ref 135–145)

## 2015-04-21 LAB — BODY FLUID CELL COUNT WITH DIFFERENTIAL
Lymphs, Fluid: 25 %
Monocyte-Macrophage-Serous Fluid: 42 % — ABNORMAL LOW (ref 50–90)
Neutrophil Count, Fluid: 17 % (ref 0–25)
Other Cells, Fluid: 16 %
Total Nucleated Cell Count, Fluid: 118 cu mm (ref 0–1000)

## 2015-04-21 LAB — STREP PNEUMONIAE URINARY ANTIGEN: STREP PNEUMO URINARY ANTIGEN: NEGATIVE

## 2015-04-21 LAB — CBC
HCT: 37.8 % — ABNORMAL LOW (ref 39.0–52.0)
Hemoglobin: 13.4 g/dL (ref 13.0–17.0)
MCH: 36 pg — AB (ref 26.0–34.0)
MCHC: 35.4 g/dL (ref 30.0–36.0)
MCV: 101.6 fL — ABNORMAL HIGH (ref 78.0–100.0)
PLATELETS: 145 10*3/uL — AB (ref 150–400)
RBC: 3.72 MIL/uL — ABNORMAL LOW (ref 4.22–5.81)
RDW: 12.6 % (ref 11.5–15.5)
WBC: 8.1 10*3/uL (ref 4.0–10.5)

## 2015-04-21 LAB — URINALYSIS, ROUTINE W REFLEX MICROSCOPIC
Bilirubin Urine: NEGATIVE
Glucose, UA: NEGATIVE mg/dL
HGB URINE DIPSTICK: NEGATIVE
Ketones, ur: NEGATIVE mg/dL
NITRITE: NEGATIVE
Protein, ur: NEGATIVE mg/dL
SPECIFIC GRAVITY, URINE: 1.016 (ref 1.005–1.030)
pH: 6 (ref 5.0–8.0)

## 2015-04-21 LAB — LACTATE DEHYDROGENASE, PLEURAL OR PERITONEAL FLUID: LD, Fluid: 23 U/L (ref 3–23)

## 2015-04-21 LAB — MAGNESIUM
MAGNESIUM: 1.5 mg/dL — AB (ref 1.7–2.4)
Magnesium: 1.6 mg/dL — ABNORMAL LOW (ref 1.7–2.4)

## 2015-04-21 LAB — GRAM STAIN

## 2015-04-21 LAB — ALBUMIN, FLUID (OTHER)

## 2015-04-21 LAB — URINE MICROSCOPIC-ADD ON

## 2015-04-21 LAB — PROTEIN, BODY FLUID

## 2015-04-21 LAB — PREALBUMIN: PREALBUMIN: 2.6 mg/dL — AB (ref 18–38)

## 2015-04-21 LAB — GLUCOSE, PERITONEAL FLUID: GLUCOSE, PERITONEAL FLUID: 133 mg/dL

## 2015-04-21 LAB — ETHANOL

## 2015-04-21 MED ORDER — ALBUTEROL SULFATE (2.5 MG/3ML) 0.083% IN NEBU: 3.0000 mL | INHALATION_SOLUTION | Freq: Four times a day (QID) | RESPIRATORY_TRACT | Status: DC | PRN

## 2015-04-21 MED ORDER — LACTULOSE 10 GM/15ML PO SOLN
10.0000 g | Freq: Two times a day (BID) | ORAL | Status: DC
  Administered 2015-04-21 – 2015-04-25 (×9): 10 g via ORAL
  Filled 2015-04-21 (×9): qty 30

## 2015-04-21 MED ORDER — ONDANSETRON HCL 4 MG/2ML IJ SOLN
4.0000 mg | Freq: Once | INTRAMUSCULAR | Status: AC
  Administered 2015-04-21: 4 mg via INTRAVENOUS
  Filled 2015-04-21: qty 2

## 2015-04-21 MED ORDER — HEPARIN SODIUM (PORCINE) 5000 UNIT/ML IJ SOLN
5000.0000 [IU] | Freq: Three times a day (TID) | INTRAMUSCULAR | Status: DC
  Administered 2015-04-21 – 2015-04-25 (×13): 5000 [IU] via SUBCUTANEOUS
  Filled 2015-04-21 (×13): qty 1

## 2015-04-21 MED ORDER — FOLIC ACID 1 MG PO TABS
1.0000 mg | ORAL_TABLET | Freq: Every day | ORAL | Status: DC
  Administered 2015-04-21 – 2015-04-25 (×5): 1 mg via ORAL
  Filled 2015-04-21 (×5): qty 1

## 2015-04-21 MED ORDER — FUROSEMIDE 40 MG PO TABS
40.0000 mg | ORAL_TABLET | Freq: Every day | ORAL | Status: DC
  Administered 2015-04-21 – 2015-04-25 (×5): 40 mg via ORAL
  Filled 2015-04-21 (×5): qty 1

## 2015-04-21 MED ORDER — POTASSIUM CHLORIDE CRYS ER 20 MEQ PO TBCR
40.0000 meq | EXTENDED_RELEASE_TABLET | Freq: Once | ORAL | Status: AC
  Administered 2015-04-21: 40 meq via ORAL
  Filled 2015-04-21: qty 2

## 2015-04-21 MED ORDER — SPIRONOLACTONE 25 MG PO TABS
50.0000 mg | ORAL_TABLET | Freq: Every day | ORAL | Status: DC
  Administered 2015-04-21 – 2015-04-25 (×5): 50 mg via ORAL
  Filled 2015-04-21 (×5): qty 2

## 2015-04-21 MED ORDER — OXYCODONE HCL 5 MG PO TABS
5.0000 mg | ORAL_TABLET | Freq: Four times a day (QID) | ORAL | Status: DC | PRN
  Administered 2015-04-21 – 2015-04-24 (×6): 5 mg via ORAL
  Filled 2015-04-21 (×6): qty 1

## 2015-04-21 MED ORDER — MAGNESIUM SULFATE 2 GM/50ML IV SOLN
2.0000 g | Freq: Once | INTRAVENOUS | Status: AC
  Administered 2015-04-21: 2 g via INTRAVENOUS
  Filled 2015-04-21: qty 50

## 2015-04-21 MED ORDER — FUROSEMIDE 10 MG/ML IJ SOLN
40.0000 mg | Freq: Once | INTRAMUSCULAR | Status: AC
  Administered 2015-04-21: 40 mg via INTRAVENOUS
  Filled 2015-04-21: qty 4

## 2015-04-21 MED ORDER — ONDANSETRON HCL 4 MG PO TABS: 4.0000 mg | ORAL_TABLET | Freq: Four times a day (QID) | ORAL | Status: DC | PRN

## 2015-04-21 MED ORDER — PANTOPRAZOLE SODIUM 40 MG PO TBEC
40.0000 mg | DELAYED_RELEASE_TABLET | Freq: Every day | ORAL | Status: DC
  Administered 2015-04-21 – 2015-04-25 (×5): 40 mg via ORAL
  Filled 2015-04-21 (×5): qty 1

## 2015-04-21 MED ORDER — HYDROMORPHONE HCL 1 MG/ML IJ SOLN: 0.5000 mg | INTRAMUSCULAR | Status: DC | PRN

## 2015-04-21 MED ORDER — ONDANSETRON HCL 4 MG/2ML IJ SOLN: 4.0000 mg | Freq: Four times a day (QID) | INTRAMUSCULAR | Status: DC | PRN

## 2015-04-21 MED ORDER — AMOXICILLIN-POT CLAVULANATE 875-125 MG PO TABS
1.0000 | ORAL_TABLET | Freq: Two times a day (BID) | ORAL | Status: DC
  Administered 2015-04-21 – 2015-04-25 (×9): 1 via ORAL
  Filled 2015-04-21 (×9): qty 1

## 2015-04-21 MED ORDER — VITAMIN B-1 100 MG PO TABS
100.0000 mg | ORAL_TABLET | Freq: Every day | ORAL | Status: DC
  Administered 2015-04-21 – 2015-04-25 (×5): 100 mg via ORAL
  Filled 2015-04-21 (×5): qty 1

## 2015-04-21 MED ORDER — MORPHINE SULFATE (PF) 2 MG/ML IV SOLN: 1.0000 mg | INTRAVENOUS | Status: DC | PRN

## 2015-04-21 NOTE — Progress Notes (Signed)
Pt seen and examined at bedside, admitted after midnight, please see earlier admission note by Dr. Katrinka BlazingSmith. Pt admitted for decompensated alcoholic liver cirrhosis, underwent paracentesis and reports feeling better this AM. His blood work still shows low Na but trending up 118 --> 120. K and Mg both low and need supplementation. Will keep inpatient, repeat CBC, BMP, Mg levels in AM. There was also report of urinary retention and foley placed but pt asking to take it out and asking to eat. Advance diet.   Debbora PrestoMAGICK-Reichen Hutzler, MD  [facility] Pager [phone]  If 7PM-7AM, please contact night-coverage [email or web address] Password [email or web address]

## 2015-04-21 NOTE — Evaluation (Signed)
Physical Therapy Evaluation Patient Details Name: Bryce MawRobert Escher MRN: [id number] DOB: 03/08/1961 Today's Date: 04/21/2015   History of Present Illness  54 yo male admitted with abdominal pain, incontinence.  Hx of liver cirrhosis, ETOH abuse, ascites. Recent hospitalizations 10/14-10/31/16 and 11/12-11/17/16 for PNA, ascites. s/p multiple paracenteses  Clinical Impression  Pt is independent with mobility. He ambulated 200' with RW independently without loss of balance. No further PT indicated. PT signing off.     Follow Up Recommendations No PT follow up    Equipment Recommendations  None recommended by PT    Recommendations for Other Services       Precautions / Restrictions Precautions Precautions: Fall Restrictions Weight Bearing Restrictions: No      Mobility  Bed Mobility Overal bed mobility: Independent                Transfers Overall transfer level: Independent                  Ambulation/Gait Ambulation/Gait assistance: Independent Ambulation Distance (Feet): 200 Feet Assistive device: Rolling walker (2 wheeled) Gait Pattern/deviations: WFL(Within Functional Limits)   Gait velocity interpretation: at or above normal speed for age/gender General Gait Details: steady with turns, no LOB  Stairs            Wheelchair Mobility    Modified Rankin (Stroke Patients Only)       Balance Overall balance assessment: Modified Independent                                           Pertinent Vitals/Pain Pain Assessment: 0-10 Pain Score: 8  Pain Location: foley catheter insertion site Pain Descriptors / Indicators: Sore Pain Intervention(s): Monitored during session    Home Living Family/patient expects to be discharged to:: Private residence Living Arrangements: Other relatives (brother) Available Help at Discharge: Family;Available 24 hours/day   Home Access: Stairs to enter   Entrance Stairs-Number of Steps: 1 Home  Layout: One level Home Equipment: Walker - 2 wheels Additional Comments: pt reports his brother is always there and that daughter takes them to the grocery store    Prior Function Level of Independence: Independent with assistive device(s)         Comments: uses RW PRN     Hand Dominance   Dominant Hand: Right    Extremity/Trunk Assessment   Upper Extremity Assessment: Overall WFL for tasks assessed           Lower Extremity Assessment: Overall WFL for tasks assessed      Cervical / Trunk Assessment: Normal  Communication      Cognition Arousal/Alertness: Awake/alert Behavior During Therapy: WFL for tasks assessed/performed Overall Cognitive Status: Within Functional Limits for tasks assessed                      General Comments      Exercises        Assessment/Plan    PT Assessment Patent does not need any further PT services  PT Diagnosis     PT Problem List    PT Treatment Interventions     PT Goals (Current goals can be found in the Care Plan section) Acute Rehab PT Goals Patient Stated Goal: home soon PT Goal Formulation: All assessment and education complete, DC therapy    Frequency     Barriers to discharge  Co-evaluation               End of Session Equipment Utilized During Treatment: Gait belt Activity Tolerance: Patient tolerated treatment well Patient left: with call bell/phone within reach;in chair;with chair alarm set           Time: 1191-4782 PT Time Calculation (min) (ACUTE ONLY): 14 min   Charges:   PT Evaluation $Initial PT Evaluation Tier I: 1 Procedure     PT G CodesRalene Bathe Kistler 04/21/2015, 1:27 PM [phone]

## 2015-04-21 NOTE — ED Notes (Signed)
MD at bedside. 

## 2015-04-21 NOTE — Progress Notes (Signed)
Pt was setup with [facility] on 04/15/2015 and we are unable to assist with medication at this time.  Pt has an appointment with [facility] on 04/27/15  At 2:30 PM. Pt will be able to purchase medications at [facility]/Pharmacy. Pt is aware.

## 2015-04-21 NOTE — H&P (Signed)
[facility] History and Physical  Bryce MawRobert [name] ZOX:[id number] DOB: 07-21-1960 DOA: 04/20/2015  Referring physician: ED PCP: No primary care provider on file.   Chief Complaint: abdominal pain  HPI:  [name] is a 54 year old male with a past medical history significant for alcoholic liver cirrhosis diagnosed 02/2015, alcohol abuse, and tobacco abuse; who presented to the emergency department for a least 4 day history of progressively worsening right-sided abdominal pain. Pain is described as a achy pressure feeling. Symptoms worsened with movement. Associated symptoms include shortness of breath and reported inability to feel the need to urinate with intermittent incontinence of urine. Patient reports the that in the last week and a half he's had at least 4 paracentesis. Review of records shows previous paracentesis with removal of fluid: 2.5L on 10/14, 4.6L on 10/30, 6 L on 11/12, and 6.9L 11/19.  [facility]-up visit yesterday and they took his blood pressure and obtain blood work, but did not do a paracentesis. In reviewing office note, today had discussed with him the need to take his medications, started him on lactulose, and represcribed him Augmentin for history of a possible healthcare associated pneumonia Patient states that he was able to get home he was okay for a few hours; however his abdominal pain progressively worsened to the point where he had to call EMS. Patient notes that he lacks money to obtain medications as well as transportation issues as barriers to adequate medical treatment. He notes that the last drink he had was approximately 5 days ago.   Review of Systems  Constitutional: Positive for chills. Negative for fever.  HENT: Negative for hearing loss.   Eyes: Negative for double vision and photophobia.  Respiratory: Positive for shortness of breath and wheezing.   Cardiovascular: Positive for leg swelling. Negative for palpitations.   Gastrointestinal: Positive for abdominal pain. Negative for constipation.  Genitourinary: Positive for urgency. Negative for hematuria.  Musculoskeletal: Positive for falls. Negative for back pain.  Skin: Positive for rash. Negative for itching.  Neurological: Positive for weakness. Negative for speech change, focal weakness and headaches.  Endo/Heme/Allergies: Positive for environmental allergies. Negative for polydipsia.  Psychiatric/Behavioral: Positive for substance abuse.       Past Medical History  Diagnosis Date  . ETOH abuse   . Cirrhosis of liver [facility](HCC)      Past Surgical History  Procedure Laterality Date  . No past surgeries      jaw surgery 7981yrs ago      Social History:  reports that he has been smoking Cigarettes.  He has a 40 pack-year smoking history. He has never used smokeless tobacco. He reports that he drinks about 4.8 oz of alcohol per week. He reports that he does not use illicit drugs. Where does patient live--home  and with whom if at home? His brother Can patient participate in ADLs? Yes  No Known Allergies  Family History  Problem Relation Age of Onset  . Diabetes Mother        Prior to Admission medications   Medication Sig Start Date End Date Taking? Authorizing Provider  albuterol (PROVENTIL HFA;VENTOLIN HFA) 108 (90 BASE) MCG/ACT inhaler Inhale into the lungs every 6 (six) hours as needed for wheezing or shortness of breath.    Historical Provider, MD  amoxicillin-clavulanate (AUGMENTIN) 875-125 MG tablet Take 1 tablet by mouth 2 (two) times daily. 04/20/15   [name], MD  folic acid (FOLVITE) 1 MG tablet Take 1 tablet (1  mg total) by mouth daily. 04/15/15   Elease Etienne, MD  furosemide (LASIX) 40 MG tablet Take 1 tablet (40 mg total) by mouth daily. 04/20/15   [name], MD  lactulose (CHRONULAC) 10 GM/15ML solution Take 15 mLs (10 g total) by mouth 2 (two) times daily. 04/20/15   [name], MD  pantoprazole (PROTONIX) 40 MG  tablet Take 1 tablet (40 mg total) by mouth daily. 04/15/15   Elease Etienne, MD  spironolactone (ALDACTONE) 50 MG tablet Take 1 tablet (50 mg total) by mouth daily. 04/15/15   Elease Etienne, MD  thiamine 100 MG tablet Take 1 tablet (100 mg total) by mouth daily. 04/15/15   Elease Etienne, MD     Physical Exam: Filed Vitals:   04/20/15 2136 04/20/15 2318 04/21/15 0008 04/21/15 0232  BP: 126/89 126/89 107/76 115/79  Pulse: 112 112 87 83  Temp: 97.6 F (36.4 C)     TempSrc: Oral     Resp: 20  16 16   Height: 6\' 1"  (1.854 m)     Weight: 86.637 kg (191 lb)     SpO2: 100%  94% 97%     Constitutional: Vital signs reviewed. Patient appears ill and significantly older than stated age, but does not appear to be toxic. Head: Normocephalic and atraumatic  Ear: TM normal bilaterally  Mouth: no erythema or exudates, MMM  Eyes: PERRL, EOMI, conjunctivae normal, No scleral icterus.  Neck: Supple, Trachea midline normal ROM, No JVD, mass, thyromegaly, or carotid bruit present.  Cardiovascular: RRR, S1 normal, S2 normal, no MRG, pulses symmetric and intact bilaterally  Pulmonary/Chest: Decreased inspiratory and expiratory air movement, no wheezes, rales, or rhonchi  Abdominal: Protuberant abdomen that is tender to palpation over the right side quadrant. Patient has a bandage over the paracentesis spot abdomen is less tense than previous. Positive fluid wave. Positive bowel sounds no significant guarding. GU: no CVA tenderness Musculoskeletal: No joint deformities, erythema, or stiffness, ROM full and no nontender Ext:3+ pitting edema to the bilateral thigh. No cyanosis, pulses palpable bilaterally (DP and PT)  Hematology: no cervical, inginal, or axillary adenopathy.  Neurological: A&O x3, Strenght is normal and symmetric bilaterally, cranial nerve II-XII are grossly intact, no focal motor deficit, sensory intact to light touch bilaterally.  Skin: Significantly sun aged skin with multiple pits  noted on the back. No cyanosis, or clubbing.  Psychiatric: Normal mood and affect. speech and behavior is normal. Judgment and thought content  poor. Cognition and memory are normal.      Data Review   Micro Results Recent Results (from the past 240 hour(s))  Culture, body fluid-bottle     Status: None (Preliminary result)   Collection Time: 04/20/15 11:40 PM  Result Value Ref Range Status   Specimen Description PERITONEAL  Final   Special Requests   Final    BOTTLES DRAWN AEROBIC AND ANAEROBIC Performed at [facility]    Culture PENDING  Incomplete   Report Status PENDING  Incomplete    Radiology Reports Dg Chest 2 View  04/17/2015  CLINICAL DATA:  Shortness of breath. Recent healthcare acquired pneumonia. EXAM: CHEST  2 VIEW COMPARISON:  04/10/2015 FINDINGS: Decreased patchy opacity in the left lower lobe consistent with improving pneumonia. Right lung is clear. Cardiomediastinal contours are normal. Mild hyperinflation and bronchitic change, stable. No evident pneumothorax or pleural effusion. No pulmonary edema. No acute osseous abnormalities. IMPRESSION: Improving left lower lobe pneumonia. No new acute abnormality is seen. Electronically Signed  By: Rubye Oaks M.D.   On: 04/17/2015 01:48   US Paracentesis  04/17/2015  CLINICAL DATA:  54 year-old with cirrhosis and recurrent ascites. Patient complains of abdominal distension. EXAM: ULTRASOUND GUIDED PARACENTESIS COMPARISON:  04/13/2015 PROCEDURE: An ultrasound guided paracentesis was thoroughly discussed with the patient and questions answered. The benefits, risks, alternatives and complications were also discussed. The patient understands and wishes to proceed with the procedure. Written consent was obtained. Ultrasound was performed to localize and mark an adequate pocket of fluid in the right lower quadrant of the abdomen. The area was then prepped and draped in the normal sterile fashion. 1% Lidocaine was used for  local anesthesia. Under ultrasound guidance a Safe-T-Centesis catheter was introduced. Paracentesis was performed. The catheter was removed and a dressing applied. COMPLICATIONS: None. FINDINGS: A total of approximately 6.9 L of opaque yellow fluid was removed. IMPRESSION: Successful ultrasound guided paracentesis yielding 6.9 L of ascites. Electronically Signed   By: Richarda Overlie M.D.   On: 04/17/2015 10:11   US Paracentesis  04/13/2015  INDICATION: Cirrhosis, recurrent ascites and request for paracentesis. EXAM: ULTRASOUND-GUIDED PARACENTESIS COMPARISON:  Paracentesis 03/28/2015. MEDICATIONS: None. COMPLICATIONS: None immediate TECHNIQUE: Informed written consent was obtained from the patient after a discussion of the risks, benefits and alternatives to treatment. A timeout was performed prior to the initiation of the procedure. Initial ultrasound scanning demonstrates a large amount of ascites within the right lower abdominal quadrant. The right lower abdomen was prepped and draped in the usual sterile fashion. 1% lidocaine was used for local anesthesia. Under direct ultrasound guidance, a 19 gauge, 10-cm, Yueh catheter was introduced. An ultrasound image was saved for documentation purposed. The paracentesis was performed. The catheter was removed and a dressing was applied. The patient tolerated the procedure well without immediate post procedural complication. FINDINGS: A total of approximately 6 liters of serous fluid was removed. IMPRESSION: Successful ultrasound-guided paracentesis yielding 6 liters of peritoneal fluid. Read By:  Pattricia Boss PA-C Electronically Signed   By: Irish Lack M.D.   On: 04/13/2015 16:37   US Paracentesis  03/28/2015  CLINICAL DATA:  Cirrhosis, ascites EXAM: ULTRASOUND GUIDED PARACENTESIS TECHNIQUE: The procedure, risks (including but not limited to bleeding, infection, organ damage ), benefits, and alternatives were explained to the patient. Questions regarding the  procedure were encouraged and answered. The patient understands and consents to the procedure. Survey ultrasound of the abdomen was performed and an appropriate skin entry site in the right lateral abdomen was selected. Skin site was marked, prepped with Betadine, and draped in usual sterile fashion, and infiltrated locally with 1% lidocaine. A Safe-T-Centesis sheath needle was advanced into the peritoneal space until fluid could be aspirated. The sheath was advanced and the needle removed. 4.6 L of clear pale yellowascites were aspirated. COMPLICATIONS: COMPLICATIONS none IMPRESSION: Technically successful ultrasound guided paracentesis, removing 4.6 L of ascites. Electronically Signed   By: Corlis Leak M.D.   On: 03/28/2015 12:08   Dg Chest Portable 1 View  04/10/2015  CLINICAL DATA:  Dyspnea. pt from home/hotel. Pt with ascites d/t chronic alcohol use. Drank today. Abdomen distended. Abdominal pain. Smoker. EXAM: PORTABLE CHEST 1 VIEW COMPARISON:  03/28/2015 FINDINGS: Heart size is normal. There is mild prominence of interstitial markings and perihilar bronchitic change. More focal opacity at the left lung base appears new and likely represents acute infectious process. Consider aspiration pneumonia. No pulmonary edema. IMPRESSION: 1. Bronchitic changes. 2. Left lower lobe infiltrate. Electronically Signed   By: Norva Pavlov  M.D.   On: 04/10/2015 18:33   Dg Abd Acute W/chest  03/28/2015  CLINICAL DATA:  Abdominal pain and distention EXAM: DG ABDOMEN ACUTE W/ 1V CHEST COMPARISON:  Chest radiograph August 25, 2013; CT abdomen and pelvis March 12, 2015 FINDINGS: PA chest: There is no edema or consolidation. The heart size and pulmonary vascularity are normal. No adenopathy. Supine and upright abdomen: There is no bowel dilatation or air-fluid level suggesting obstruction. No free air. There is moderate stool throughout the colon. There are scattered foci of arterial vascular calcification. IMPRESSION:  Bowel gas pattern unremarkable. Moderate stool in colon. No lung edema or consolidation. Electronically Signed   By: Bretta Bang III M.D.   On: 03/28/2015 02:27     CBC  Recent Labs Lab 04/14/15 0850 04/15/15 0458 04/17/15 0115 04/20/15 2215  WBC 6.2 6.9 8.2 9.6  HGB 14.6 13.0 15.9 13.8  HCT 42.3 37.4* 44.9 38.7*  PLT 85* 92* 122* 180  MCV 103.2* 103.3* 103.0* 99.5  MCH 35.6* 35.9* 36.5* 35.5*  MCHC 34.5 34.8 35.4 35.7  RDW 12.9 13.2 13.4 12.7  LYMPHSABS  --   --  2.1  --   MONOABS  --   --  1.5*  --   EOSABS  --   --  0.2  --   BASOSABS  --   --  0.1  --     Chemistries   Recent Labs Lab 04/14/15 0850 04/15/15 0458 04/17/15 0115 04/20/15 2211 04/20/15 2215  NA 128* 128* 126*  --  118*  K 2.9* 3.4* 3.6  --  3.6  CL 95* 97* 91*  --  88*  CO2 27 27 26   --  24  GLUCOSE 134* 115* 113*  --  136*  BUN 5* 5* 5*  --  9  CREATININE 0.73 0.69 0.64  --  0.64  CALCIUM 8.0* 7.8* 8.3*  --  7.7*  MG 1.6* 1.6*  --  1.5*  --   AST 46*  --  45*  --  37  ALT 27  --  25  --  22  ALKPHOS 137*  --  129*  --  122  BILITOT 2.4*  --  2.2*  --  1.7*   ------------------------------------------------------------------------------------------------------------------ estimated creatinine clearance is 119.3 mL/min (by C-G formula based on Cr of 0.64). ------------------------------------------------------------------------------------------------------------------ No results for input(s): HGBA1C in the last 72 hours. ------------------------------------------------------------------------------------------------------------------ No results for input(s): CHOL, HDL, LDLCALC, TRIG, CHOLHDL, LDLDIRECT in the last 72 hours. ------------------------------------------------------------------------------------------------------------------ No results for input(s): TSH, T4TOTAL, T3FREE, THYROIDAB in the last 72 hours.  Invalid input(s):  FREET3 ------------------------------------------------------------------------------------------------------------------ No results for input(s): VITAMINB12, FOLATE, FERRITIN, TIBC, IRON, RETICCTPCT in the last 72 hours.  Coagulation profile  Recent Labs Lab 04/17/15 0115  INR 1.25    No results for input(s): DDIMER in the last 72 hours.  Cardiac Enzymes No results for input(s): CKMB, TROPONINI, MYOGLOBIN in the last 168 hours.  Invalid input(s): CK ------------------------------------------------------------------------------------------------------------------ Invalid input(s): POCBNP   CBG: No results for input(s): GLUCAP in the last 168 hours.     EKG: ordered   Assessment/Plan Principal Problem:    Acute decompensated alcoholic cirrhosis: Patient with history of inability to obtain medications due to cost. Presenting with significant abdominal distention and pain. Patient underwent paracentesis with interventional radiology. Patient was calculated to have a meld score of 8 in clinic yesterday. Initial paracentesis fluid showing 76 WBCs, 30 lymphs, 22 neutrophils, 48 monocyte-macrophages. No significant signs of SBP at this time -Care team  consult for a.m. for difficulty obtaining meds  -IV Lasix  x1 dose -strict in&out -Restart home regimen of Lasix, spironolactone, lactulose in am -prn morphine /oxycodone IR for pain  Reported urinary retention: Acute suspect secondary to significant amount of ascites. -Place Foley -reevaluate continuation in am  Hyponatremia with excess extracellular volume: Acutely worsening previously patient had had sodium levels in the 120s on presentation today sodium 118. Alert and not obtunded on physical exam. -BMP q4 x3 - Placed and renal fluid restricted diet  Healthcare-associated pneumonia : Patient reports shortness of breath than a recurrent cough chest x-ray shows improved left lower lobe. -Checking sputum cultures and  blood cultures -Continue Augmentin per outpatient recommendations  Hypoalbuminemia: Patient's albumin was noted to be 2.1 -Checking prealbumin in a.m. suspect severe protein calorie nutrition  Alcohol abuse: Patient reports last drink being less than a week ago. -Checking ethanol level -CWIAA protocols  -Social work for alcohol abuse  Tobacco abuse: Chronic.  -Counseled on need of tobacco cessation -Will offer patient a nicotine patch    Prolonged Q-T interval on ECG: Patient with borderline QT on last admission  - EKG ordered  DVT prophylaxis: Heparin  Code Status:   full Family Communication: bedside Disposition Plan: admit   Total time spent 55 minutes.Greater than 50% of this time was spent in counseling, explanation of diagnosis, planning of further management, and coordination of care  Clydie Braun [facility] Pager [phone]  If 7PM-7AM, please contact night-coverage [email or web address] Password [facility] 04/21/2015, 2:34 AM

## 2015-04-22 DIAGNOSIS — F1020 Alcohol dependence, uncomplicated: Secondary | ICD-10-CM

## 2015-04-22 DIAGNOSIS — F1010 Alcohol abuse, uncomplicated: Secondary | ICD-10-CM

## 2015-04-22 DIAGNOSIS — K7031 Alcoholic cirrhosis of liver with ascites: Principal | ICD-10-CM

## 2015-04-22 DIAGNOSIS — Z7189 Other specified counseling: Secondary | ICD-10-CM

## 2015-04-22 DIAGNOSIS — J189 Pneumonia, unspecified organism: Secondary | ICD-10-CM

## 2015-04-22 DIAGNOSIS — Z515 Encounter for palliative care: Secondary | ICD-10-CM

## 2015-04-22 DIAGNOSIS — K859 Acute pancreatitis without necrosis or infection, unspecified: Secondary | ICD-10-CM | POA: Diagnosis present

## 2015-04-22 DIAGNOSIS — E876 Hypokalemia: Secondary | ICD-10-CM

## 2015-04-22 LAB — CBC
HCT: 35 % — ABNORMAL LOW (ref 39.0–52.0)
HEMOGLOBIN: 12.1 g/dL — AB (ref 13.0–17.0)
MCH: 34.6 pg — ABNORMAL HIGH (ref 26.0–34.0)
MCHC: 34.6 g/dL (ref 30.0–36.0)
MCV: 100 fL (ref 78.0–100.0)
PLATELETS: 121 10*3/uL — AB (ref 150–400)
RBC: 3.5 MIL/uL — AB (ref 4.22–5.81)
RDW: 12.8 % (ref 11.5–15.5)
WBC: 8.7 10*3/uL (ref 4.0–10.5)

## 2015-04-22 LAB — URINE CULTURE: CULTURE: NO GROWTH

## 2015-04-22 LAB — COMPREHENSIVE METABOLIC PANEL
ALBUMIN: 1.7 g/dL — AB (ref 3.5–5.0)
ALK PHOS: 91 U/L (ref 38–126)
ALT: 17 U/L (ref 17–63)
AST: 27 U/L (ref 15–41)
Anion gap: 4 — ABNORMAL LOW (ref 5–15)
BUN: 9 mg/dL (ref 6–20)
CALCIUM: 7.6 mg/dL — AB (ref 8.9–10.3)
CHLORIDE: 91 mmol/L — AB (ref 101–111)
CO2: 26 mmol/L (ref 22–32)
CREATININE: 0.65 mg/dL (ref 0.61–1.24)
GFR calc non Af Amer: >60 mL/min (ref >60)
GLUCOSE: 107 mg/dL — AB (ref 65–99)
Potassium: 3.8 mmol/L (ref 3.5–5.1)
SODIUM: 121 mmol/L — AB (ref 135–145)
Total Bilirubin: 1.4 mg/dL — ABNORMAL HIGH (ref 0.3–1.2)
Total Protein: 4.9 g/dL — ABNORMAL LOW (ref 6.5–8.1)

## 2015-04-22 LAB — MAGNESIUM: MAGNESIUM: 1.8 mg/dL (ref 1.7–2.4)

## 2015-04-22 LAB — LEGIONELLA PNEUMOPHILA SEROGP 1 UR AG: L. PNEUMOPHILA SEROGP 1 UR AG: NEGATIVE

## 2015-04-22 LAB — LIPASE, BLOOD: Lipase: 57 U/L — ABNORMAL HIGH (ref 11–51)

## 2015-04-22 MED ORDER — MAGNESIUM SULFATE 2 GM/50ML IV SOLN
2.0000 g | Freq: Once | INTRAVENOUS | Status: AC
  Administered 2015-04-22: 2 g via INTRAVENOUS
  Filled 2015-04-22: qty 50

## 2015-04-22 NOTE — Consult Note (Signed)
Consultation Note Date: 04/22/2015   Patient Name: [name]  DOB: 12-04-1960  MRN: [id number]  Age / Sex: 54 y.o., male  PCP: No primary care provider on file. 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 He has had multiple paracentesis with removal of fluid: 2.5L on 10/14, 4.6L on 10/30, 6 L on 11/12, and 6.9L 11/19. [facility] a [facility]-up visit yesterday, but he does not have insurance or finances to obtain medications as well as transportation issues as barriers to adequate medical treatment. Palliative consulted for goals of care.  [name] reports that the most important things to him are feeling as well as possible as long as possible, being at his home (lives with his brother), and his family (especially his daughter and brother).  He says that his doctors have been doing a good job explaining things to him and he reports understanding that his underlying problem is related to his liver and that it is not something that can be fixed and his options are limited by the fact he has continued to drink.  He became upset during conversation and kept going back to the past and how he wishes that he had made different choices.  He reports that moving forward, he needs to figure out how to get the resources to get his medications and get to his appointments.  We started conversation on advance care planning, and he reports that his daughter is who knows him best and who he would want to serve as surrogate decision maker.  Did not press  conversation further today as my main goal was to get to know [name] and begin to build rapport.  Contacts/Participants in Discussion: Patient Primary Decision Maker: self Relationship to Patient self HCPOA: No.  Reports that he would like his daughter to serve as Runner, broadcasting/film/video.  He is not married and so she would be his default surrogate as his next-of-kin  SUMMARY OF RECOMMENDATIONS - He would like his daughter to serve as surrogate if he is not able to make his own decisions. - He became overwhelmed when talking about future and seriousness of his disease.  Will plan to follow-up to continue conversation tomorrow. - No change in care plan at this time.  FULL CODE and want to pursue all interventions that may extend his life.  Code Status/Advance Care Planning: Full code    Code Status Orders        Start     Ordered   04/21/15 0443  Full code   Continuous     04/21/15 0442      Other Directives:None  Symptom Management:  Pain: reports well controlled today.  Continue current regimen.  Continue paracentesis asa needed  Palliative Prophylaxis:   Frequent Pain Assessment   Psycho-social/Spiritual:  Support System: Fair. Lives with brother and has daughter he talks to regularly Desire for further Chaplaincy support:no Additional Recommendations: Medicaid/Financial Assistance  Prognosis: Unable to determine  Discharge Planning: To be determined.   Chief Complaint/ Primary Diagnoses: Present on Admission:  . Decompensated hepatic cirrhosis (HCC) . Alcoholic cirrhosis of liver with ascites (HCC) . Alcohol abuse . Healthcare-associated pneumonia . Tobacco abuse . (  Resolved) Prolonged Q-T interval on ECG . Hyponatremia with excess extracellular fluid volume . Alcohol dependence (HCC) . Thrombocytopenia (HCC) . Pancreatitis, acute . Hypokalemia . Hypomagnesemia  I have reviewed the medical record, interviewed the patient and family, and examined  the patient. The following aspects are pertinent.  Past Medical History  Diagnosis Date  . ETOH abuse   . Cirrhosis of liver [facility])    Social History   Social History  . Marital Status: Single    Spouse Name: N/A  . Number of Children: N/A  . Years of Education: N/A   Social History Main Topics  . Smoking status: Current Every Day Smoker -- 1.00 packs/day for 40 years    Types: Cigarettes  . Smokeless tobacco: Never Used  . Alcohol Use: 4.8 oz/week    8 Cans of beer per week     Comment: drinks daily-drinks whatever is available  . Drug Use: No  . Sexual Activity: No   Other Topics Concern  . None   Social History Narrative   Family History  Problem Relation Age of Onset  . Diabetes Mother    Scheduled Meds: . amoxicillin-clavulanate  1 tablet Oral Q12H  . folic acid  1 mg Oral Daily  . furosemide  40 mg Oral Daily  . heparin  5,000 Units Subcutaneous 3 times per day  . lactulose  10 g Oral BID  . LORazepam  0-4 mg Intravenous 4 times per day  . LORazepam  0-4 mg Intravenous Q12H  . pantoprazole  40 mg Oral Daily  . spironolactone  50 mg Oral Daily  . thiamine  100 mg Oral Daily   Continuous Infusions:  PRN Meds:.albuterol, HYDROmorphone (DILAUDID) injection, ondansetron **OR** ondansetron (ZOFRAN) IV, oxyCODONE Medications Prior to Admission:  Prior to Admission medications   Medication Sig Start Date End Date Taking? Authorizing Provider  albuterol (PROVENTIL HFA;VENTOLIN HFA) 108 (90 BASE) MCG/ACT inhaler Inhale into the lungs every 6 (six) hours as needed for wheezing or shortness of breath.   Yes Historical Provider, MD  folic acid (FOLVITE) 1 MG tablet Take 1 tablet (1 mg total) by mouth daily. 04/15/15  Yes Elease Etienne, MD  pantoprazole (PROTONIX) 40 MG tablet Take 1 tablet (40 mg total) by mouth daily. 04/15/15  Yes Elease Etienne, MD  spironolactone (ALDACTONE) 50 MG tablet Take 1 tablet (50 mg total) by mouth daily. 04/15/15  Yes Elease Etienne,  MD  amoxicillin-clavulanate (AUGMENTIN) 875-125 MG tablet Take 1 tablet by mouth 2 (two) times daily. Patient not taking: Reported on 04/21/2015 04/20/15   [name], MD  furosemide (LASIX) 40 MG tablet Take 1 tablet (40 mg total) by mouth daily. Patient not taking: Reported on 04/21/2015 04/20/15   [name], MD  lactulose (CHRONULAC) 10 GM/15ML solution Take 15 mLs (10 g total) by mouth 2 (two) times daily. Patient not taking: Reported on 04/21/2015 04/20/15   [name], MD  thiamine 100 MG tablet Take 1 tablet (100 mg total) by mouth daily. Patient not taking: Reported on 04/21/2015 04/15/15   Elease Etienne, MD   No Known Allergies  Review of Systems  Constitutional: Positive for activity change, appetite change, fatigue and unexpected weight change.  Respiratory: Positive for shortness of breath.   Cardiovascular: Positive for leg swelling.  Gastrointestinal: Positive for nausea, abdominal pain and abdominal distention.  Musculoskeletal: Positive for back pain.  All other systems reviewed and are negative.   Physical Exam  Constitutional: He is oriented to  person, place, and time. No distress.  Older than stated age  HENT:  Head: Normocephalic and atraumatic.  Eyes: Right eye exhibits no discharge. Left eye exhibits no discharge. No scleral icterus.  Neck: JVD present. No tracheal deviation present. No thyromegaly present.  Cardiovascular: Normal rate and regular rhythm.   Respiratory: Effort normal. No respiratory distress. He has no wheezes.  GI: He exhibits distension. There is tenderness.  Musculoskeletal: He exhibits edema.  Neurological: He is alert and oriented to person, place, and time. No cranial nerve deficit.  Skin: Skin is warm and dry. He is not diaphoretic.  Psychiatric: He has a normal mood and affect. Thought content normal.    Vital Signs: BP 95/69 mmHg  Pulse 77  Temp(Src) 98 F (36.7 C) (Oral)  Resp 16  Ht 6\' 1"  (1.854 m)  Wt 75.6 kg (166  lb 10.7 oz)  BMI 21.99 kg/m2  SpO2 98%  SpO2: SpO2: 98 % O2 Device:SpO2: 98 % O2 Flow Rate: .   IO: Intake/output summary:  Intake/Output Summary (Last 24 hours) at 04/22/15 2200 Last data filed at 04/22/15 [id number]  Gross per 24 hour  Intake    700 ml  Output    850 ml  Net   -150 ml    LBM: Last BM Date: 04/22/15 Baseline Weight: Weight: 86.637 kg (191 lb) Most recent weight: Weight: 75.6 kg (166 lb 10.7 oz)      Palliative Assessment/Data:  Flowsheet Rows        Most Recent Value   Intake Tab    Referral Department  Hospitalist   Unit at Time of Referral  Med/Surg Unit   Palliative Care Primary Diagnosis  Other (Comment) [Cirrhosis]   Date Notified  04/21/15   Palliative Care Type  New Palliative care   Reason for referral  Clarify Goals of Care   Date of Admission  04/20/15   Date first seen by Palliative Care  04/22/15   # of days Palliative referral response time  1 Day(s)   # of days IP prior to Palliative referral  1   Clinical Assessment    Palliative Performance Scale Score  60%   Pain Max last 24 hours  8   Pain Min Last 24 hours  2   Psychosocial & Spiritual Assessment    Palliative Care Outcomes       Additional Data Reviewed:  CBC:    Component Value Date/Time   WBC 8.7 04/22/2015 0455   HGB 12.1* 04/22/2015 0455   HCT 35.0* 04/22/2015 0455   HCT 39.5 03/13/2015 0558   PLT 121* 04/22/2015 0455   MCV 100.0 04/22/2015 0455   NEUTROABS 4.3 04/17/2015 0115   LYMPHSABS 2.1 04/17/2015 0115   MONOABS 1.5* 04/17/2015 0115   EOSABS 0.2 04/17/2015 0115   BASOSABS 0.1 04/17/2015 0115   Comprehensive Metabolic Panel:    Component Value Date/Time   NA 121* 04/22/2015 0455   K 3.8 04/22/2015 0455   CL 91* 04/22/2015 0455   CO2 26 04/22/2015 0455   BUN 9 04/22/2015 0455   CREATININE 0.65 04/22/2015 0455   GLUCOSE 107* 04/22/2015 0455   CALCIUM 7.6* 04/22/2015 0455   AST 27 04/22/2015 0455   ALT 17 04/22/2015 0455   ALKPHOS 91 04/22/2015 0455    BILITOT 1.4* 04/22/2015 0455   PROT 4.9* 04/22/2015 0455   ALBUMIN 1.7* 04/22/2015 0455     Time In: 1015 Time Out: 1120 Time Total: 65 Greater than 50%  of this time  was spent counseling and coordinating care related to the above assessment and plan.  Signed by: Romie Minus, MD  Romie Minus, MD  04/22/2015, 10:00 PM  Please contact Palliative Medicine Team phone at [phone] for questions and concerns.

## 2015-04-22 NOTE — Progress Notes (Signed)
Patient ID: [name], male   DOB: 03-Feb-1961, 54 y.o.   MRN: [id number]  [facility] PROGRESS NOTE  [name] BMW:[id number] DOB: [birth year] DOA: 04/20/2015 PCP: No primary care provider on file.   Brief narrative:    Pt is 54 yo male with known alcoholic liver cirrhosis complicated by recurrent ascites, admitted for decompensated alcoholic liver cirrhosis, underwent paracentesis 11/19 and felt better initially but started to feel worse 24 hours prior to this admission. Of note pt has had three paracentesis done since 10/30 and had > 17 L fluid removed.   In ED, pt's blood work notable for low Na but trending up 118 --> 120. K and Mg both low and needed supplementation.  Assessment/Plan:    Principal Problem:   Decompensated hepatic cirrhosis (HCC) with recurrent ascites  - clinically improving but more abd distension this AM - needs repeat paracentesis which will be done in AM - continue monitoring   Active Problems:   Alcohol dependence (HCC) and alcohol abuse  - alcohol cessation consultation provided - no signs of withdrawal while inpatient  - continue thiamine, folic acid, lactulose, lasix     Healthcare-associated pneumonia, LLL, ? Aspiration PNA - continue Augmentin day #2    Hyponatremia with excess extracellular fluid volume - improving slowly  - BMP In AM    Thrombocytopenia (HCC) - alcohol induced bone marrow damage - no signs of bleeding - CBC in AM    Pancreatitis, acute - lipase still elevated but slowly trending down - pt denies abd pain this AM - OK to advance diet if pt able to tolerate     Hypomagnesemia - with hypokalemia - both electrolytes supplemented and WNL - Mg is low end of normal so will give additional dose of Mg this AM    Tobacco abuse - allow nicotine patch if pt wants   DVT prophylaxis - Heparin SQ  Code Status: Full.  Family Communication:  plan of care discussed with the patient Disposition Plan: Home by 11/26,  needs paracentesis which will be done 11/25.  IV access:  Peripheral IV  Procedures and diagnostic studies:    Dg Chest 2 View 04/17/2015   Improving left lower lobe pneumonia. No new acute abnormality is seen.   US Paracentesis 04/17/2015 Successful ultrasound guided paracentesis yielding 6.9 L of ascites.   US Paracentesis 04/13/2015  Successful ultrasound-guided paracentesis yielding 6 liters of peritoneal fluid.   US Paracentesis 03/28/2015  Technically successful ultrasound guided paracentesis, removing 4.6 L of ascites.  Medical Consultants:  IR  Other Consultants:  PT - no follow up needed   IAnti-Infectives:   Augmentin 11/23 -->  Debbora Presto, MD  [facility] Pager [phone]  If 7PM-7AM, please contact night-coverage [email or web address] Password [facility] 04/22/2015, 11:43 AM   LOS: 1 day   HPI/Subjective: No events overnight. Has more abd distension this AM.  Objective: Filed Vitals:   04/21/15 0404 04/21/15 1707 04/21/15 2207 04/22/15 0345  BP: 120/85 102/69 96/68 111/76  Pulse: 70 86 91 100  Temp: 97.4 F (36.3 C) 97.3 F (36.3 C) 97.9 F (36.6 C) 97.6 F (36.4 C)  TempSrc: Oral Oral Oral Oral  Resp: Height:      Weight: 86.8 kg (191 lb 5.8 oz)   75.6 kg (166 lb 10.7 oz)  SpO2: 100% 100% 97% 97%    Intake/Output Summary (Last 24 hours) at 04/22/15 1143 Last data filed at 04/21/15 2300  Gross per 24 hour  Intake  390 ml  Output   1100 ml  Net   -710 ml    Exam:   General:  Pt is alert, not in acute distress  Cardiovascular: Regular rate and rhythm, no rubs, no gallops  Respiratory: Clear to auscultation bilaterally, rhonchi at bases   Abdomen: Soft, distended with fluid wace, bowel sounds present, no guarding  Extremities: pulses DP and PT palpable bilaterally   Data Reviewed: Basic Metabolic Panel:  Recent Labs Lab 04/17/15 0115 04/20/15 2211 04/20/15 2215 04/21/15 0555 04/21/15 0910 04/22/15 0455  NA 126*  --   118* 121* 120* 121*  K 3.6  --  3.6 3.3* 3.2* 3.8  CL 91*  --  88* 88* 88* 91*  CO2 26  --  24 29 28 26   GLUCOSE 113*  --  136* 110* 154* 107*  BUN 5*  --  9 9 8 9   CREATININE 0.64  --  0.64 0.62 0.62 0.65  CALCIUM 8.3*  --  7.7* 7.8* 7.6* 7.6*  MG  --  1.5*  --   --  1.6* 1.8   Liver Function Tests:  Recent Labs Lab 04/17/15 0115 04/20/15 2215 04/22/15 0455  AST 45* 37 27  ALT 25 22 17   ALKPHOS 129* 122 91  BILITOT 2.2* 1.7* 1.4*  PROT 6.5 5.9* 4.9*  ALBUMIN 2.4* 2.1* 1.7*    Recent Labs Lab 04/17/15 0115 04/20/15 2215 04/22/15 0455  LIPASE 50 63* 57*   CBC:  Recent Labs Lab 04/17/15 0115 04/20/15 2215 04/21/15 0555 04/22/15 0455  WBC 8.2 9.6 8.1 8.7  NEUTROABS 4.3  --   --   --   HGB 15.9 13.8 13.4 12.1*  HCT 44.9 38.7* 37.8* 35.0*  MCV 103.0* 99.5 101.6* 100.0  PLT 122* 180 145* 121*   Recent Results (from the past 240 hour(s))  Culture, body fluid-bottle     Status: None (Preliminary result)   Collection Time: 04/20/15 11:40 PM  Result Value Ref Range Status   Specimen Description PERITONEAL  Final   Special Requests BOTTLES DRAWN AEROBIC AND ANAEROBIC  Final   Culture   Final    NO GROWTH 1 DAY Per[facility]    Report Status PENDING  Incomplete  Gram stain     Status: None   Collection Time: 04/20/15 11:40 PM  Result Value Ref Range Status   Specimen Description PERITONEAL  Final   Special Requests NONE  Final   Gram Stain   Final    CYTOSPIN SMEAR WBC PRESENT,BOTH PMN AND MONONUCLEAR NO ORGANISMS SEEN Performed at [facility]    Report Status 04/21/2015 FINAL  Final  Culture, blood (routine x 2) Call MD if unable to obtain prior to antibiotics being given     Status: None (Preliminary result)   Collection Time: 04/21/15  5:55 AM  Result Value Ref Range Status   Specimen Description BLOOD LEFT ARM  Final   Special Requests BOTTLES DRAWN AEROBIC ONLY 7CC  Final   Culture   Final    NO GROWTH 1 DAY Performed at  [facility]     Report Status PENDING  Incomplete  Culture, blood (routine x 2) Call MD if unable to obtain prior to antibiotics being given     Status: None (Preliminary result)   Collection Time: 04/21/15  5:55 AM  Result Value Ref Range Status   Specimen Description BLOOD RIGHT ARM  Final   Special Requests BOTTLES DRAWN AEROBIC ONLY 5CC  Final  Culture   Final    NO GROWTH 1 DAY Performed at [facility]    Report Status PENDING  Incomplete  Culture, Urine     Status: None   Collection Time: 04/21/15  1:32 PM  Result Value Ref Range Status   Specimen Description URINE, CATHETERIZED  Final   Special Requests NONE  Final   Culture   Final    NO GROWTH 1 DAY Performed at [facility]    Report Status 04/22/2015 FINAL  Final     Scheduled Meds: . amoxicillin-clavulanate  1 tablet Oral Q12H  . folic acid  1 mg Oral Daily  . furosemide  40 mg Oral Daily  . heparin  5,000 Units Subcutaneous 3 times per day  . lactulose  10 g Oral BID  . LORazepam  0-4 mg Intravenous 4 times per day  . LORazepam  0-4 mg Intravenous Q12H  . pantoprazole  40 mg Oral Daily  . spironolactone  50 mg Oral Daily  . thiamine  100 mg Oral Daily   Continuous Infusions:

## 2015-04-23 ENCOUNTER — Inpatient Hospital Stay (HOSPITAL_COMMUNITY): Payer: Self-pay

## 2015-04-23 DIAGNOSIS — Z7189 Other specified counseling: Secondary | ICD-10-CM | POA: Insufficient documentation

## 2015-04-23 DIAGNOSIS — Z515 Encounter for palliative care: Secondary | ICD-10-CM | POA: Insufficient documentation

## 2015-04-23 DIAGNOSIS — R109 Unspecified abdominal pain: Secondary | ICD-10-CM | POA: Insufficient documentation

## 2015-04-23 LAB — CBC
HEMATOCRIT: 34.7 % — AB (ref 39.0–52.0)
Hemoglobin: 12 g/dL — ABNORMAL LOW (ref 13.0–17.0)
MCH: 35 pg — ABNORMAL HIGH (ref 26.0–34.0)
MCHC: 34.6 g/dL (ref 30.0–36.0)
MCV: 101.2 fL — ABNORMAL HIGH (ref 78.0–100.0)
PLATELETS: 119 10*3/uL — AB (ref 150–400)
RBC: 3.43 MIL/uL — ABNORMAL LOW (ref 4.22–5.81)
RDW: 12.8 % (ref 11.5–15.5)
WBC: 8 10*3/uL (ref 4.0–10.5)

## 2015-04-23 LAB — COMPREHENSIVE METABOLIC PANEL
ALT: 16 U/L — AB (ref 17–63)
AST: 26 U/L (ref 15–41)
Albumin: 1.7 g/dL — ABNORMAL LOW (ref 3.5–5.0)
Alkaline Phosphatase: 89 U/L (ref 38–126)
Anion gap: 4 — ABNORMAL LOW (ref 5–15)
BUN: 7 mg/dL (ref 6–20)
CHLORIDE: 93 mmol/L — AB (ref 101–111)
CO2: 28 mmol/L (ref 22–32)
CREATININE: 0.75 mg/dL (ref 0.61–1.24)
Calcium: 7.7 mg/dL — ABNORMAL LOW (ref 8.9–10.3)
Glucose, Bld: 111 mg/dL — ABNORMAL HIGH (ref 65–99)
Potassium: 3.4 mmol/L — ABNORMAL LOW (ref 3.5–5.1)
Sodium: 125 mmol/L — ABNORMAL LOW (ref 135–145)
Total Bilirubin: 1.3 mg/dL — ABNORMAL HIGH (ref 0.3–1.2)
Total Protein: 5 g/dL — ABNORMAL LOW (ref 6.5–8.1)

## 2015-04-23 LAB — MAGNESIUM: MAGNESIUM: 2 mg/dL (ref 1.7–2.4)

## 2015-04-23 MED ORDER — POTASSIUM CHLORIDE CRYS ER 20 MEQ PO TBCR
40.0000 meq | EXTENDED_RELEASE_TABLET | Freq: Once | ORAL | Status: AC
  Administered 2015-04-23: 40 meq via ORAL
  Filled 2015-04-23: qty 2

## 2015-04-23 NOTE — Procedures (Signed)
   US guided RLQ paracentesis  4 liters collected Pt tolerated well

## 2015-04-23 NOTE — Progress Notes (Signed)
Patient ID: [name], male   DOB: 1960-06-08, 54 y.o.   MRN: [id number]  [facility] PROGRESS NOTE  [name] NWG:[id number] DOB: January 20, 1961 DOA: 04/20/2015 PCP: No primary care provider on file.   Brief narrative:    Pt is 54 yo male with known alcoholic liver cirrhosis complicated by recurrent ascites, admitted for decompensated alcoholic liver cirrhosis, underwent paracentesis 11/19 and felt better initially but started to feel worse 24 hours prior to this admission. Of note pt has had three paracentesis done since 10/30 and had > 17 L fluid removed.   In ED, pt's blood work notable for low Na but trending up 118 --> 120. K and Mg both low and needed supplementation.  Assessment/Plan:    Principal Problem:   Decompensated hepatic cirrhosis (HCC) with recurrent ascites  - clinically improving, had paracentesis this AM, 4 L removed  - continue monitoring   Active Problems:   Alcohol dependence (HCC) and alcohol abuse  - alcohol cessation consultation provided - no signs of withdrawal while inpatient  - continue thiamine, folic acid, lactulose, lasix     Healthcare-associated pneumonia, LLL, ? Aspiration PNA - continue Augmentin day #3    Hyponatremia with excess extracellular fluid volume - improving slowly, Na 125 this AM - BMP In AM    Thrombocytopenia (HCC) - alcohol induced bone marrow damage - no signs of bleeding - CBC in AM    Pancreatitis, acute - lipase still elevated but slowly trending down - pt denies abd pain this AM    Hypomagnesemia - with hypokalemia - supplement K this AM    Tobacco abuse - allow nicotine patch if pt wants   DVT prophylaxis - Heparin SQ  Code Status: Full.  Family Communication:  plan of care discussed with the patient Disposition Plan: Home by 11/26, if Na > 127   IV access:  Peripheral IV  Procedures and diagnostic studies:    Dg Chest 2 View 04/17/2015   Improving left lower lobe pneumonia. No new acute  abnormality is seen.   US Paracentesis 04/17/2015 Successful ultrasound guided paracentesis yielding 6.9 L of ascites.   US Paracentesis 04/13/2015  Successful ultrasound-guided paracentesis yielding 6 liters of peritoneal fluid.   US Paracentesis 03/28/2015  Technically successful ultrasound guided paracentesis, removing 4.6 L of ascites.  Medical Consultants:  IR  Other Consultants:  PT - no follow up needed   IAnti-Infectives:   Augmentin 11/23 -->  Debbora Presto, MD  [facility] Pager [phone]  If 7PM-7AM, please contact night-coverage [email or web address] Password [facility] 04/23/2015, 11:26 AM   LOS: 2 days   HPI/Subjective: No events overnight.   Objective: Filed Vitals:   04/23/15 0835 04/23/15 0850 04/23/15 0858 04/23/15 0916  BP: 100/74 105/76 95/61 104/78  Pulse:      Temp:      TempSrc:      Resp:      Height:      Weight:      SpO2:        Intake/Output Summary (Last 24 hours) at 04/23/15 1126 Last data filed at 04/23/15 0816  Gross per 24 hour  Intake    960 ml  Output   1050 ml  Net    -90 ml    Exam:   General:  Pt is alert, not in acute distress  Cardiovascular: Regular rate and rhythm, no rubs, no gallops  Respiratory: Clear to auscultation bilaterally, rhonchi at bases   Abdomen: Soft, mildly distended, bowel sounds present, no guarding  Extremities: pulses DP and PT palpable bilaterally   Data Reviewed: Basic Metabolic Panel:  Recent Labs Lab 04/20/15 2211 04/20/15 2215 04/21/15 0555 04/21/15 0910 04/22/15 0455 04/23/15 0438  NA  --  118* 121* 120* 121* 125*  K  --  3.6 3.3* 3.2* 3.8 3.4*  CL  --  88* 88* 88* 91* 93*  CO2  --  GLUCOSE  --  136* 110* 154* 107* 111*  BUN  --  CREATININE  --  0.64 0.62 0.62 0.65 0.75  CALCIUM  --  7.7* 7.8* 7.6* 7.6* 7.7*  MG 1.5*  --   --  1.6* 1.8 2.0   Liver Function Tests:  Recent Labs Lab 04/17/15 0115 04/20/15 2215 04/22/15 0455 04/23/15 0438  AST 45*  37 27 26  ALT 16*  ALKPHOS 129* 122 91 89  BILITOT 2.2* 1.7* 1.4* 1.3*  PROT 6.5 5.9* 4.9* 5.0*  ALBUMIN 2.4* 2.1* 1.7* 1.7*    Recent Labs Lab 04/17/15 0115 04/20/15 2215 04/22/15 0455  LIPASE 50 63* 57*   CBC:  Recent Labs Lab 04/17/15 0115 04/20/15 2215 04/21/15 0555 04/22/15 0455 04/23/15 0438  WBC 8.2 9.6 8.1 8.7 8.0  NEUTROABS 4.3  --   --   --   --   HGB 15.9 13.8 13.4 12.1* 12.0*  HCT 44.9 38.7* 37.8* 35.0* 34.7*  MCV 103.0* 99.5 101.6* 100.0 101.2*  PLT 122* 180 145* 121* 119*   Recent Results (from the past 240 hour(s))  Culture, body fluid-bottle     Status: None (Preliminary result)   Collection Time: 04/20/15 11:40 PM  Result Value Ref Range Status   Specimen Description PERITONEAL  Final   Special Requests BOTTLES DRAWN AEROBIC AND ANAEROBIC  Final   Culture   Final    NO GROWTH 1 DAY Performed at [facility]    Report Status PENDING  Incomplete  Gram stain     Status: None   Collection Time: 04/20/15 11:40 PM  Result Value Ref Range Status   Specimen Description PERITONEAL  Final   Special Requests NONE  Final   Gram Stain   Final    CYTOSPIN SMEAR WBC PRESENT,BOTH PMN AND MONONUCLEAR NO ORGANISMS SEEN Performed at [facility]    Report Status 04/21/2015 FINAL  Final  Culture, blood (routine x 2) Call MD if unable to obtain prior to antibiotics being given     Status: None (Preliminary result)   Collection Time: 04/21/15  5:55 AM  Result Value Ref Range Status   Specimen Description BLOOD LEFT ARM  Final   Special Requests BOTTLES DRAWN AEROBIC ONLY 7CC  Final   Culture   Final    NO GROWTH 1 DAY Performed at [facility]    Report Status PENDING  Incomplete  Culture, blood (routine x 2) Call MD if unable to obtain prior to antibiotics being given     Status: None (Preliminary result)   Collection Time: 04/21/15  5:55 AM  Result Value Ref Range Status   Specimen Description BLOOD RIGHT ARM  Final    Special Requests BOTTLES DRAWN AEROBIC ONLY 5CC  Final   Culture   Final    NO GROWTH 1 DAY Performed at [facility]    Report Status PENDING  Incomplete  Culture, Urine     Status: None   Collection Time: 04/21/15  1:32 PM  Result Value Ref  Range Status   Specimen Description URINE, CATHETERIZED  Final   Special Requests NONE  Final   Culture   Final    NO GROWTH 1 DAY Performed at [facility]    Report Status 04/22/2015 FINAL  Final     Scheduled Meds: . amoxicillin-clavulanate  1 tablet Oral Q12H  . folic acid  1 mg Oral Daily  . furosemide  40 mg Oral Daily  . heparin  5,000 Units Subcutaneous 3 times per day  . lactulose  10 g Oral BID  . LORazepam  0-4 mg Intravenous Q12H  . pantoprazole  40 mg Oral Daily  . spironolactone  50 mg Oral Daily  . thiamine  100 mg Oral Daily   Continuous Infusions:

## 2015-04-23 NOTE — Progress Notes (Signed)
Daily Progress Note   Patient Name: [name]       Date: 04/23/2015 DOB: July 29, 1960  Age: 54 y.o. MRN#: [id number] Attending Physician: Theodis Blaze, MD Primary Care Physician: No primary care provider on file. 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 He has had multiple paracentesis with removal of fluid: 2.5L on 10/14, 4.6L on 10/30, 6 L on 11/12, and 6.9L 11/19. He went to the [facility] for a follow-up visit yesterday, but he does not have insurance or finances to obtain medications as well as transportation issues as barriers to adequate medical treatment. Palliative consulted for goals of care.   Interval Events: I met with [name] this afternoon. We discussed again about the chronic nature of his illness and this is something is not going to be cured. He became upset again today talking about how he should've done things differently in the past and now he is unsure how he is going to manage as he becomes more ill and has been unable to make his doctor's appointments were for his medications.  As he noted yesterday the most important things to him are his family and being at home, we discussed that as he does become more ill we will need to develop plan of care to focus on continuing therapies that would maximize chance of being well enough to return home and limiting therapies not in line with this goal. Discussed with him that I'm concerned that if he continues to become more ill to the point where he would require ICU level care or have cardiac or respiratory arrest that aggressive care in this setting would  not be likely to result in him getting well enough to live independently as he has been to this point in time.   He reports understanding this fact but also this is overwhelming to him. He told me that for now he needs to focus on how he is going to achieve short-term goals of obtaining his medications and making it to follow-up appointments once he is discharged.  Length of Stay: 2 days  Current Medications: Scheduled Meds:  . amoxicillin-clavulanate  1 tablet Oral Q12H  . folic acid  1 mg Oral Daily  . furosemide  40 mg Oral Daily  . heparin  5,000 Units Subcutaneous 3 times per day  . lactulose  10 g Oral BID  . LORazepam  0-4 mg Intravenous Q12H  . pantoprazole  40 mg Oral Daily  . spironolactone  50 mg Oral Daily  . thiamine  100 mg Oral Daily    Continuous Infusions:    PRN Meds: albuterol, HYDROmorphone (DILAUDID) injection, ondansetron **OR** ondansetron (ZOFRAN) IV, oxyCODONE  Palliative Performance Scale: 50%     Vital Signs: BP 114/75 mmHg  Pulse 102  Temp(Src) 97.6 F (36.4 C) (Oral)  Resp 16  Ht 6' 1"  (1.854 m)  Wt 70.9 kg (156 lb 4.9 oz)  BMI 20.63 kg/m2  SpO2 100% SpO2: SpO2: 100 % O2 Device: O2 Device: Not Delivered O2 Flow Rate:    Intake/output summary:  Intake/Output Summary (Last 24 hours) at 04/23/15 1939 Last data filed at 04/23/15 1700  Gross per 24 hour  Intake  1080 ml  Output    800 ml  Net    280 ml   LBM:   Baseline Weight: Weight: 86.637 kg (191 lb) Most recent weight: Weight: 70.9 kg (156 lb 4.9 oz)  Physical Exam: Constitutional: Awake and alert. No distress.  Older than stated age  HENT:  Head: Normocephalic and atraumatic.  Eyes: Right eye exhibits no discharge. Left eye exhibits no discharge. No scleral icterus.  Neck: JVD present. No tracheal deviation present. No thyromegaly present.  Cardiovascular: Normal rate and regular rhythm.  Respiratory: Effort normal. No respiratory distress. He has no wheezes.  GI: He  exhibits distension. There is tenderness.  Musculoskeletal: He exhibits edema.  Neurological: He is alert and oriented to person, place, and time. No cranial nerve deficit.  Skin: Skin is warm and dry. He is not diaphoretic.  Psychiatric: He has a normal mood and affect. Thought content normal.               Additional Data Reviewed: Recent Labs     04/22/15  0455  04/23/15  0438  WBC  8.7  8.0  HGB  12.1*  12.0*  PLT  121*  119*  NA  121*  125*  BUN  9  7  CREATININE  0.65  0.75     Problem List:  Patient Active Problem List   Diagnosis Date Noted  . Pancreatitis, acute 04/22/2015  . Hypokalemia 04/22/2015  . Hypomagnesemia 04/22/2015  . Decompensated hepatic cirrhosis ([place]) 04/21/2015  . Healthcare-associated pneumonia 04/21/2015  . Hyponatremia with excess extracellular fluid volume 04/21/2015  . Tobacco abuse 04/20/2015  . Alcoholic cirrhosis of liver with ascites ([place])   . Alcohol abuse 03/12/2015  . Thrombocytopenia ([place]) 03/12/2015  . Alcohol dependence ([place]) 08/25/2013     Palliative Care Assessment & Plan    Code Status:  Full code  Goals of Care:  Patient reports understanding he has a serious disease; wants any interventions that would possibly extend his life.  He would like his daughter to service surrogate if he is unable to make his own decisions.  He remains full code  Will continue discussion with patient tomorrow if he is still admitted. I also advised him that it may be beneficial for [place] to continue to follow with him on future admissions to continue to help him process where he is at in his disease course and how we can continue to modify his care plan to ensure we are keeping his goals at the center of his care.  Psycho-social/Spiritual:  Desire for further Chaplaincy support:no   Prognosis: Unable to determine Discharge Planning: Home  Care plan was discussed with patient  Thank you for allowing the Palliative Medicine Team to  assist in the care of this patient.   Time In: 1600 Time Out: 1630 Total Time 30 Prolonged Time Billed no    Greater than 50%  of this time was spent counseling and coordinating care related to the above assessment and plan.   [name] Rough, MD  04/23/2015, 7:39 PM  Please contact Palliative Medicine Team phone at [phone] for questions and concerns.

## 2015-04-24 DIAGNOSIS — F10220 Alcohol dependence with intoxication, uncomplicated: Secondary | ICD-10-CM

## 2015-04-24 DIAGNOSIS — R109 Unspecified abdominal pain: Secondary | ICD-10-CM

## 2015-04-24 LAB — CBC
HEMATOCRIT: 37.1 % — AB (ref 39.0–52.0)
HEMOGLOBIN: 12.7 g/dL — AB (ref 13.0–17.0)
MCH: 34.7 pg — ABNORMAL HIGH (ref 26.0–34.0)
MCHC: 34.2 g/dL (ref 30.0–36.0)
MCV: 101.4 fL — ABNORMAL HIGH (ref 78.0–100.0)
Platelets: 118 10*3/uL — ABNORMAL LOW (ref 150–400)
RBC: 3.66 MIL/uL — ABNORMAL LOW (ref 4.22–5.81)
RDW: 12.8 % (ref 11.5–15.5)
WBC: 9.1 10*3/uL (ref 4.0–10.5)

## 2015-04-24 LAB — BASIC METABOLIC PANEL
ANION GAP: 8 (ref 5–15)
BUN: 7 mg/dL (ref 6–20)
CHLORIDE: 94 mmol/L — AB (ref 101–111)
CO2: 23 mmol/L (ref 22–32)
Calcium: 7.7 mg/dL — ABNORMAL LOW (ref 8.9–10.3)
Creatinine, Ser: 0.7 mg/dL (ref 0.61–1.24)
GFR calc non Af Amer: >60 mL/min (ref >60)
Glucose, Bld: 110 mg/dL — ABNORMAL HIGH (ref 65–99)
Potassium: 3.8 mmol/L (ref 3.5–5.1)
Sodium: 125 mmol/L — ABNORMAL LOW (ref 135–145)

## 2015-04-24 LAB — LIPASE, BLOOD: Lipase: 57 U/L — ABNORMAL HIGH (ref 11–51)

## 2015-04-24 LAB — MAGNESIUM: MAGNESIUM: 1.8 mg/dL (ref 1.7–2.4)

## 2015-04-24 MED ORDER — FUROSEMIDE 10 MG/ML IJ SOLN
40.0000 mg | Freq: Once | INTRAMUSCULAR | Status: AC
  Administered 2015-04-24: 40 mg via INTRAVENOUS
  Filled 2015-04-24: qty 4

## 2015-04-24 MED ORDER — LORAZEPAM 2 MG/ML IJ SOLN: 0.0000 mg | Freq: Two times a day (BID) | INTRAMUSCULAR | Status: DC

## 2015-04-24 MED ORDER — FUROSEMIDE 10 MG/ML IJ SOLN
INTRAMUSCULAR | Status: AC
  Filled 2015-04-24: qty 4

## 2015-04-24 NOTE — Progress Notes (Signed)
I met again with Mr. Statz this evening.  He reports being hopeful that he can be discharged tomorrow.  He remains worried about his financial and transportation situation after discharge, but reports that he spoke with SW and she was very helpful and supportive and he is feeling much better about his ability to remain compliant with regimen on discharge.    I tried to talk with him again about long term prognosis. He was able to verbalize that he is very sick and will not likely get any better, but he was unwilling to engage in further conversation regarding goals of care.  I advised that he should continue this conversation with his PCP as outpatient, and he reports that he will do so.    Will plan to follow up again tomorrow if he is still inpatient.  [name] Rough, MD [facility] Team [phone]

## 2015-04-24 NOTE — Clinical Social Work Note (Signed)
Clinical Social Work Assessment  Patient Details  Name: [name] MRN: [id number] Date of Birth: 02-19-1961  Date of referral:  04/24/15               Reason for consult:  Substance Use/ETOH Abuse                Permission sought to share information with:  Family Supports Permission granted to share information::  No  Name::        Agency::     Relationship::     Contact Information:     Housing/Transportation Living arrangements for the past 2 months:  Hotel/Motel Source of Information:  Patient Patient Interpreter Needed:  None Criminal Activity/Legal Involvement Pertinent to Current Situation/Hospitalization:  No - Comment as needed Significant Relationships:  Siblings Lives with:  Siblings Do you feel safe going back to the place where you live?  Yes Need for family participation in patient care:  No (Coment)  Care giving concerns:  Pt admitted from hotel where pt lives with pt brother. Pt identifies main concerns as being financial and transportation concerns that are a barrier to him being able to follow up with outpatient doctor's visits.    Social Worker assessment / plan:  CSW received referral for current substance abuse.   CSW reviewed chart and pt was negative of alcohol from [facility] upon admission.  CSW met with pt at bedside to complete psychosocial assessment. Pt reports that he lives at [facility] with his brother. CSW discussed history of alcohol use and pt admitted to history of alcohol use, but states that he has been sober for the past two weeks. Pt discussed that he is motivated to maintain his sobriety as his medical conditions are worsening. Pt states that he thinks that he has found an AA meeting and plans to attend because he attended [place] meetings in the past and at one point remained sober for 4 years. CSW discussed with pt that CSW can provide list of AA meetings in [facility] and pt agreeable. SBIRT not appropriate at this time as pt did not test  positive for alcohol upon admission.  CSW provided supportive listening as pt discussed that his main concerns are financial and transportation. Pt stated that he tries his best to make follow up appointment, but transportation is often a barrier. Pt stated that he is aware the he has a follow up appointment scheduled next week at [facility]. CSW discussed applying for Medicaid and Disability and pt agreeable to receiving information regarding how to apply. CSW encourage pt to ask to meet with social worker at [facility] who can continue to assist pt with psychosocial concerns.   CSW provided pt a list of AA meetings, information on how to apply for Medicaid and Social Security Disability. CSW provided pt bus passes in order to get to follow up appointment at [place] and to assist with getting to [place] to apply for Medicaid.   Pt will need bus pass when medically ready for discharge. CSW to provide upon discharge.   Employment status:  Unemployed Forensic scientist:  Self Pay (Medicaid Pending) PT Recommendations:  No Follow Up Information / Referral to community resources:  Other (Comment Required), Outpatient Substance Abuse Treatment Options (Information on how to apply for disability and how to apply for Medicaid)  Patient/Family's Response to care:  Pt alert and oriented x 4. Pt motivated  to maintain sobriety and resources provided. Pt hopeful to get further assistance from [facility] social worker during his appointment next week. Pt confirmed that he plans to keep appointment and was aware of time and had plan to catch bus to appointment.   Patient/Family's Understanding of and Emotional Response to Diagnosis, Current Treatment, and Prognosis:  Pt displayed knowledge surrounding his diagnosis and prognosis. Pt stated, "I know I am dying and I have to continue to not drink". Pt  agreeable to plan for follow up at [place] and appreciative of resources.  Emotional Assessment Appearance:  Appears stated age Attitude/Demeanor/Rapport:  Other (pt appropriate) Affect (typically observed):  Appropriate Orientation:  Oriented to Self, Oriented to Place, Oriented to  Time, Oriented to Situation Alcohol / Substance use:  Not Applicable Psych involvement (Current and /or in the community):  No (Comment)  Discharge Needs  Concerns to be addressed:  Financial / Insurance Concerns, Substance Abuse Concerns Readmission within the last 30 days:  No Current discharge risk:  Lack of support system, Inadequate Financial Supports, Chronically ill Barriers to Discharge:  Continued Medical Work up   Alison Murray A, LCSW 04/24/2015, 3:59 PM  [phone]

## 2015-04-24 NOTE — Progress Notes (Signed)
Patient ID: [name], male   DOB: 1960/11/16, 54 y.o.   MRN: [id number]  [facility] PROGRESS NOTE  Bryce MawRobert Torain BJY:[id number] DOB: 1960/11/16 DOA: 04/20/2015 PCP: No primary care provider on file.   Brief narrative:    Pt is 54 yo male with known alcoholic liver cirrhosis complicated by recurrent ascites, admitted for decompensated alcoholic liver cirrhosis, underwent paracentesis 11/19 and felt better initially but started to feel worse 24 hours prior to this admission. Of note pt has had three paracentesis done since 10/30 and had > 17 L fluid removed.   In ED, pt's blood work notable for low Na but trending up 118 --> 120. K and Mg both low and needed supplementation.  Assessment/Plan:    Principal Problem:   Decompensated hepatic cirrhosis (HCC) with recurrent ascites  - clinically improving, had paracentesis 11/25, 4 L removed  - continue monitoring  - may need repeat paracentesis in AM and possible d/c home   Active Problems:   Alcohol dependence (HCC) and alcohol abuse  - alcohol cessation consultation provided - no signs of withdrawal while inpatient  - continue thiamine, folic acid, lactulose, lasix     Healthcare-associated pneumonia, LLL, ? Aspiration PNA - continue Augmentin day #4    Hyponatremia with excess extracellular fluid volume - improving slowly, Na 125 this AM, same in the past 24 hours  - BMP In AM    Thrombocytopenia (HCC) - alcohol induced bone marrow damage - no signs of bleeding - CBC in AM    Pancreatitis, acute - lipase still elevated but slowly trending down - pt denies abd pain this AM - tolerating diet well     Hypomagnesemia - with hypokalemia - supplemented and WNL this AM    Tobacco abuse - allow nicotine patch if pt wants   DVT prophylaxis - Heparin SQ  Code Status: Full.  Family Communication:  plan of care discussed with the patient Disposition Plan: Home by 11/27, if Na > 127   IV access:  Peripheral  IV  Procedures and diagnostic studies:    Dg Chest 2 View 04/17/2015   Improving left lower lobe pneumonia. No new acute abnormality is seen.   [place] Paracentesis 04/17/2015 Successful ultrasound guided paracentesis yielding 6.9 L of ascites.   [place] Paracentesis 04/13/2015  Successful ultrasound-guided paracentesis yielding 6 liters of peritoneal fluid.   [place] Paracentesis 03/28/2015  Technically successful ultrasound guided paracentesis, removing 4.6 L of ascites.  Medical Consultants:  IR  Other Consultants:  PT - no follow up needed   IAnti-Infectives:   Augmentin 11/23 -->  Debbora PrestoMAGICK-Makensie Mulhall, MD  [facility] Pager [phone]  If 7PM-7AM, please contact night-coverage [email or web address] Password [email or web address] 04/24/2015, 8:59 AM   LOS: 3 days   HPI/Subjective: No events overnight.   Objective: Filed Vitals:   04/23/15 0916 04/23/15 1425 04/23/15 2229 04/24/15 0441  BP: 104/78 114/75 101/67 97/61  Pulse:  102 81 89  Temp:  97.6 F (36.4 C) 97.8 F (36.6 C) 98.1 F (36.7 C)  TempSrc:  Oral Oral Oral  Resp:  16 20 20   Height:      Weight:    67.1 kg (147 lb 14.9 oz)  SpO2:  100% 99% 98%    Intake/Output Summary (Last 24 hours) at 04/24/15 0859 Last data filed at 04/24/15 0730  Gross per 24 hour  Intake    720 ml  Output    275 ml  Net    445 ml    Exam:   General:  Pt is alert, not in acute distress  Cardiovascular: Regular rate and rhythm, no rubs, no gallops  Respiratory: Clear to auscultation bilaterally, rhonchi at bases   Abdomen: Soft, distended, bowel sounds present, no guarding  Extremities: pulses DP and PT palpable bilaterally, +2-3 bilateral LE pitting edema with chronic venous stasis changes   Data Reviewed: Basic Metabolic Panel:  Recent Labs Lab 04/20/15 2211  04/21/15 0555 04/21/15 0910 04/22/15 0455 04/23/15 0438 04/24/15 0531  NA  --   < > 121* 120* 121* 125* 125*  K  --   < > 3.3* 3.2* 3.8 3.4* 3.8  CL  --   < > 88* 88* 91* 93* 94*  CO2   --   < > GLUCOSE  --   < > 110* 154* 107* 111* 110*  BUN  --   < > CREATININE  --   < > 0.62 0.62 0.65 0.75 0.70  CALCIUM  --   < > 7.8* 7.6* 7.6* 7.7* 7.7*  MG 1.5*  --   --  1.6* 1.8 2.0 1.8  < > = values in this interval not displayed. Liver Function Tests:  Recent Labs Lab 04/20/15 2215 04/22/15 0455 04/23/15 0438  AST 37 27 26  ALT 22 17 16*  ALKPHOS 122 91 89  BILITOT 1.7* 1.4* 1.3*  PROT 5.9* 4.9* 5.0*  ALBUMIN 2.1* 1.7* 1.7*    Recent Labs Lab 04/20/15 2215 04/22/15 0455 04/24/15 0531  LIPASE 63* 57* 57*   CBC:  Recent Labs Lab 04/20/15 2215 04/21/15 0555 04/22/15 0455 04/23/15 0438 04/24/15 0531  WBC 9.6 8.1 8.7 8.0 9.1  HGB 13.8 13.4 12.1* 12.0* 12.7*  HCT 38.7* 37.8* 35.0* 34.7* 37.1*  MCV 99.5 101.6* 100.0 101.2* 101.4*  PLT 180 145* 121* 119* 118*   Recent Results (from the past 240 hour(s))  Culture, body fluid-bottle     Status: None (Preliminary result)   Collection Time: 04/20/15 11:40 PM  Result Value Ref Range Status   Specimen Description PERITONEAL  Final   Special Requests BOTTLES DRAWN AEROBIC AND ANAEROBIC  Final   Culture   Final    NO GROWTH 2 DAYS Performed at [facility]    Report Status PENDING  Incomplete  Gram stain     Status: None   Collection Time: 04/20/15 11:40 PM  Result Value Ref Range Status   Specimen Description PERITONEAL  Final   Special Requests NONE  Final   Gram Stain   Final    CYTOSPIN SMEAR WBC PRESENT,BOTH PMN AND MONONUCLEAR NO ORGANISMS SEEN Performed at [facility]    Report Status 04/21/2015 FINAL  Final  Culture, blood (routine x 2) Call MD if unable to obtain prior to antibiotics being given     Status: None (Preliminary result)   Collection Time: 04/21/15  5:55 AM  Result Value Ref Range Status   Specimen Description BLOOD LEFT ARM  Final   Special Requests BOTTLES DRAWN AEROBIC ONLY 7CC  Final   Culture   Final    NO GROWTH 2 DAYS Performed at  [facility]    Report Status PENDING  Incomplete  Culture, blood (routine x 2) Call MD if unable to obtain prior to antibiotics being given     Status: None (Preliminary result)   Collection Time: 04/21/15  5:55 AM  Result Value Ref Range Status   Specimen Description BLOOD RIGHT ARM  Final  Special Requests BOTTLES DRAWN AEROBIC ONLY 5CC  Final   Culture   Final    NO GROWTH 2 DAYS Performed at [facility]    Report Status PENDING  Incomplete  Culture, Urine     Status: None   Collection Time: 04/21/15  1:32 PM  Result Value Ref Range Status   Specimen Description URINE, CATHETERIZED  Final   Special Requests NONE  Final   Culture   Final    NO GROWTH 1 DAY Performed at [facility]    Report Status 04/22/2015 FINAL  Final     Scheduled Meds: . amoxicillin-clavulanate  1 tablet Oral Q12H  . folic acid  1 mg Oral Daily  . furosemide  40 mg Oral Daily  . heparin  5,000 Units Subcutaneous 3 times per day  . lactulose  10 g Oral BID  . LORazepam  0-4 mg Intravenous Q12H  . pantoprazole  40 mg Oral Daily  . spironolactone  50 mg Oral Daily  . thiamine  100 mg Oral Daily   Continuous Infusions:

## 2015-04-25 LAB — CBC
HCT: 38.9 % — ABNORMAL LOW (ref 39.0–52.0)
Hemoglobin: 13.1 g/dL (ref 13.0–17.0)
MCH: 34.6 pg — AB (ref 26.0–34.0)
MCHC: 33.7 g/dL (ref 30.0–36.0)
MCV: 102.6 fL — ABNORMAL HIGH (ref 78.0–100.0)
PLATELETS: 125 10*3/uL — AB (ref 150–400)
RBC: 3.79 MIL/uL — AB (ref 4.22–5.81)
RDW: 12.9 % (ref 11.5–15.5)
WBC: 9.1 10*3/uL (ref 4.0–10.5)

## 2015-04-25 LAB — BASIC METABOLIC PANEL
Anion gap: 8 (ref 5–15)
BUN: 6 mg/dL (ref 6–20)
CALCIUM: 7.8 mg/dL — AB (ref 8.9–10.3)
CHLORIDE: 88 mmol/L — AB (ref 101–111)
CO2: 27 mmol/L (ref 22–32)
CREATININE: 0.74 mg/dL (ref 0.61–1.24)
GFR calc non Af Amer: >60 mL/min (ref >60)
Glucose, Bld: 104 mg/dL — ABNORMAL HIGH (ref 65–99)
Potassium: 2.8 mmol/L — ABNORMAL LOW (ref 3.5–5.1)
SODIUM: 123 mmol/L — AB (ref 135–145)

## 2015-04-25 MED ORDER — POTASSIUM CHLORIDE CRYS ER 20 MEQ PO TBCR
40.0000 meq | EXTENDED_RELEASE_TABLET | Freq: Two times a day (BID) | ORAL | Status: DC
  Administered 2015-04-25: 40 meq via ORAL
  Filled 2015-04-25: qty 2

## 2015-04-25 MED ORDER — LACTULOSE 10 GM/15ML PO SOLN: 10.0000 g | Freq: Two times a day (BID) | ORAL | Status: DC

## 2015-04-25 MED ORDER — AMOXICILLIN-POT CLAVULANATE 875-125 MG PO TABS: 1.0000 | ORAL_TABLET | Freq: Two times a day (BID) | ORAL | Status: DC

## 2015-04-25 MED ORDER — POTASSIUM CHLORIDE CRYS ER 20 MEQ PO TBCR: 20.0000 meq | EXTENDED_RELEASE_TABLET | Freq: Every day | ORAL | Status: DC

## 2015-04-25 MED ORDER — SPIRONOLACTONE 50 MG PO TABS: 50.0000 mg | ORAL_TABLET | Freq: Every day | ORAL | Status: DC

## 2015-04-25 MED ORDER — OXYCODONE HCL 5 MG PO TABS: 5.0000 mg | ORAL_TABLET | Freq: Four times a day (QID) | ORAL | Status: DC | PRN

## 2015-04-25 MED ORDER — FUROSEMIDE 40 MG PO TABS: 40.0000 mg | ORAL_TABLET | Freq: Every day | ORAL | Status: DC

## 2015-04-25 NOTE — Progress Notes (Signed)
Patient discharged home, discharge instructions given and explained to patient and he verbalized understanding, denies any pain/distress. No wound noted, skin intact. Patient taken to bus stop for transportation, bus pass given to patient.

## 2015-04-25 NOTE — Clinical Social Work Note (Signed)
CSW received consult for bus pass being needed for possible Sunday DC. CSW provided 2 bus passes- placed on chart for patient at time of discharge.  Vickii PennaGina Yamen Castrogiovanni, LCSW [phone]  [facility] Licensed Clinical Social Worker

## 2015-04-25 NOTE — Discharge Summary (Signed)
Physician Discharge Summary  [name] ZOX:[id number] DOB: 1961/03/15 DOA: 04/20/2015  PCP: No primary care provider on file.  Admit date: 04/20/2015 Discharge date: 04/25/2015  Recommendations for Outpatient Follow-up:  1. Pt will need to follow up with PCP in 1-2 weeks post discharge 2. Please obtain CMP to evaluate electrolytes and kidney function 3. Please also check CBC to evaluate Hg and Hct levels  Discharge Diagnoses:  Principal Problem:   Decompensated hepatic cirrhosis (HCC) Active Problems:   Alcohol dependence (HCC)   Alcohol abuse   Alcoholic cirrhosis of liver with ascites (HCC)   Healthcare-associated pneumonia   Hyponatremia with excess extracellular fluid volume   Thrombocytopenia (HCC)   Pancreatitis, acute   Hypomagnesemia   Tobacco abuse   Hypokalemia   Palliative care encounter   Goals of care, counseling/discussion   Abdominal discomfort  Discharge Condition: Stable  Diet recommendation: 2 g Na diet    Brief narrative:    Pt is 54 yo male with known alcoholic liver cirrhosis complicated by recurrent ascites, admitted for decompensated alcoholic liver cirrhosis, underwent paracentesis 11/19 and felt better initially but started to feel worse 24 hours prior to this admission. Of note pt has had three paracentesis done since 10/30 and had > 17 L fluid removed.   In ED, pt's blood work notable for low Na but trending up 118 --> 120. K and Mg both low and needed supplementation.  Assessment/Plan:    Principal Problem:  Decompensated hepatic cirrhosis (HCC) with recurrent ascites  - clinically improving, had paracentesis 11/25, 4 L removed  - continue monitoring  - holding off on paracentesis for now, pt will see how he does in few days and decide on it   Active Problems:  Alcohol dependence (HCC) and alcohol abuse  - alcohol cessation consultation provided - no signs of withdrawal while inpatient  - continue thiamine, folic acid,  lactulose, lasix    Healthcare-associated pneumonia, LLL, ? Aspiration PNA - continue Augmentin to complete therapy    Hyponatremia with excess extracellular fluid volume - from alcoholic cirrhosis and volume status   Thrombocytopenia (HCC) - alcohol induced bone marrow damage - no signs of bleeding   Pancreatitis, acute - lipase still elevated but slowly trending down - pt denies abd pain this AM   Hypomagnesemia - with hypokalemia - continue to supplement upon discharge    Tobacco abuse - allow nicotine patch if pt wants   Code Status: Full.  Family Communication: plan of care discussed with the patient Disposition Plan: Home   IV access:  Peripheral IV  Procedures and diagnostic studies:   Dg Chest 2 View 04/17/2015 Improving left lower lobe pneumonia. No new acute abnormality is seen.   US Paracentesis 04/17/2015 Successful ultrasound guided paracentesis yielding 6.9 L of ascites.   US Paracentesis 04/13/2015 Successful ultrasound-guided paracentesis yielding 6 liters of peritoneal fluid.   US Paracentesis 03/28/2015 Technically successful ultrasound guided paracentesis, removing 4.6 L of ascites.  Medical Consultants:  IR PCT  Other Consultants:  PT - no follow up needed   IAnti-Infectives:   Augmentin 11/23 -->  Debbora Presto, MD [facility] Pager [phone]      Discharge Exam: Filed Vitals:   04/24/15 2037 04/25/15 0638  BP: 96/65 102/72  Pulse: 83 77  Temp: 98.5 F (36.9 C) 98.5 F (36.9 C)  Resp: 20 18   Filed Vitals:   04/24/15 1345 04/24/15 2037 04/25/15 0441 04/25/15 0638  BP: 89/69 96/65  102/72  Pulse: 90 83  77  Temp: 98.5 F (36.9 C) 98.5 F (36.9 C)  98.5 F (36.9 C)  TempSrc: Oral Oral  Oral  Resp: Height:      Weight:   67.7 kg (149 lb 4 oz)   SpO2: 100% 99%  100%    General: Pt is alert, follows commands appropriately, not in acute distress Cardiovascular: Regular  rate and rhythm, no rubs, no gallops Respiratory: Clear to auscultation bilaterally, no wheezing, no crackles, no rhonchi Abdominal: Soft, non tender, distended, bowel sounds +, no guarding Extremities: +2 bilateral LE edema, pulses palpable bilaterally DP and PT Neuro: Grossly nonfocal  Discharge Instructions  Discharge Instructions    Diet - low sodium heart healthy    Complete by:  As directed      Increase activity slowly    Complete by:  As directed             Medication List    TAKE these medications        albuterol 108 (90 BASE) MCG/ACT inhaler  Commonly known as:  PROVENTIL HFA;VENTOLIN HFA  Inhale into the lungs every 6 (six) hours as needed for wheezing or shortness of breath.     amoxicillin-clavulanate 875-125 MG tablet  Commonly known as:  AUGMENTIN  Take 1 tablet by mouth every 12 (twelve) hours.     folic acid 1 MG tablet  Commonly known as:  FOLVITE  Take 1 tablet (1 mg total) by mouth daily.     furosemide 40 MG tablet  Commonly known as:  LASIX  Take 1 tablet (40 mg total) by mouth daily.     lactulose 10 GM/15ML solution  Commonly known as:  CHRONULAC  Take 15 mLs (10 g total) by mouth 2 (two) times daily.     oxyCODONE 5 MG immediate release tablet  Commonly known as:  Oxy IR/ROXICODONE  Take 1 tablet (5 mg total) by mouth every 6 (six) hours as needed for moderate pain.     pantoprazole 40 MG tablet  Commonly known as:  PROTONIX  Take 1 tablet (40 mg total) by mouth daily.     potassium chloride SA 20 MEQ tablet  Commonly known as:  K-DUR,KLOR-CON  Take 1 tablet (20 mEq total) by mouth daily.     spironolactone 50 MG tablet  Commonly known as:  ALDACTONE  Take 1 tablet (50 mg total) by mouth daily.     thiamine 100 MG tablet  Take 1 tablet (100 mg total) by mouth daily.           Follow-up Information    Follow up with [facility] On 04/27/2015.   Why:  appointment at 1130 AM please keep  appointment, and arrive 15 mins early.   Contact information:   [address] [place] [address] [phone]       The results of significant diagnostics from this hospitalization (including imaging, microbiology, ancillary and laboratory) are listed below for reference.     Microbiology: Recent Results (from the past 240 hour(s))  Culture, body fluid-bottle     Status: None (Preliminary result)   Collection Time: 04/20/15 11:40 PM  Result Value Ref Range Status   Specimen Description PERITONEAL  Final   Special Requests BOTTLES DRAWN AEROBIC AND ANAEROBIC  Final   Culture   Final    NO GROWTH 4 DAYS Performed at [facility]    Report Status PENDING  Incomplete  Gram stain  Status: None   Collection Time: 04/20/15 11:40 PM  Result Value Ref Range Status   Specimen Description PERITONEAL  Final   Special Requests NONE  Final   Gram Stain   Final    CYTOSPIN SMEAR WBC PRESENT,BOTH PMN AND MONONUCLEAR NO ORGANISMS SEEN Performed [facility]    Report Status 04/21/2015 FINAL  Final  Culture, blood (routine x 2) Call MD if unable to obtain prior to antibiotics being given     Status: None (Preliminary result)   Collection Time: 04/21/15  5:55 AM  Result Value Ref Range Status   Specimen Description BLOOD LEFT ARM  Final   Special Requests BOTTLES DRAWN AEROBIC ONLY 7CC  Final   Culture   Final    NO GROWTH 4 DAYS [facility]formed at [facility]    Report Status PENDING  Incomplete  Culture, blood (routine x 2) Call MD if unable to obtain prior to antibiotics being given     Status: None (Preliminary result)   Collection Time: 04/21/15  5:55 AM  Result Value Ref Range Status   Specimen Description BLOOD RIGHT ARM  Final   Special Requests BOTTLES DRAWN AEROBIC ONLY 5CC  Final   Culture   Final    NO GROWTH 4 DAYS Performed at [facility]    Report Status PENDING  Incomplete  Culture, Urine     Status: None    Collection Time: 04/21/15  1:32 PM  Result Value Ref Range Status   Specimen Description URINE, CATHETERIZED  Final   Special Requests NONE  Final   Culture   Final    NO GROWTH 1 DAY Performed at [facility]    Report Status 04/22/2015 FINAL  Final     Labs: Basic Metabolic Panel:  Recent Labs Lab 04/20/15 2211  04/21/15 0910 04/22/15 0455 04/23/15 0438 04/24/15 0531 04/25/15 0531  NA  --   < > 120* 121* 125* 125* 123*  K  --   < > 3.2* 3.8 3.4* 3.8 2.8*  CL  --   < > 88* 91* 93* 94* 88*  CO2  --   < > 28 26 28 23 27   GLUCOSE  --   < > 154* 107* 111* 110* 104*  BUN  --   < > 8 9 7 7 6   CREATININE  --   < > 0.62 0.65 0.75 0.70 0.74  CALCIUM  --   < > 7.6* 7.6* 7.7* 7.7* 7.8*  MG 1.5*  --  1.6* 1.8 2.0 1.8  --   < > = values in this interval not displayed. Liver Function Tests:  Recent Labs Lab 04/20/15 2215 04/22/15 0455 04/23/15 0438  AST 37 27 26  ALT 22 17 16*  ALKPHOS 122 91 89  BILITOT 1.7* 1.4* 1.3*  PROT 5.9* 4.9* 5.0*  ALBUMIN 2.1* 1.7* 1.7*    Recent Labs Lab 04/20/15 2215 04/22/15 0455 04/24/15 0531  LIPASE 63* 57* 57*   No results for input(s): AMMONIA in the last 168 hours. CBC:  Recent Labs Lab 04/21/15 0555 04/22/15 0455 04/23/15 0438 04/24/15 0531 04/25/15 0531  WBC 8.1 8.7 8.0 9.1 9.1  HGB 13.4 12.1* 12.0* 12.7* 13.1  HCT 37.8* 35.0* 34.7* 37.1* 38.9*  MCV 101.6* 100.0 101.2* 101.4* 102.6*  PLT 145* 121* 119* 118* 125*   SIGNED: Time coordinating discharge: 30 minutes  MAGICK-Kwane Rohl, MD  [facility] 04/25/2015, 10:28 AM Pager [phone]  If 7PM-7AM, please contact night-coverage [email or web address]  Password [email or web address]

## 2015-04-25 NOTE — Discharge Instructions (Signed)
Ascites °Ascites is a collection of excess fluid in the abdomen. Ascites can range from mild to severe. It can get worse without treatment. °CAUSES °Possible causes include: °· Cirrhosis. This is the most common cause of ascites. °· Infection or inflammation in the abdomen. °· Cancer in the abdomen. °· Heart failure. °· Kidney disease. °· Inflammation of the pancreas. °· Clots in the veins of the liver. °SIGNS AND SYMPTOMS °Signs and symptoms may include: °· A feeling of fullness in your abdomen. This is common. °· An increase in the size of your abdomen or your waist. °· Swelling in your legs. °· Swelling of the scrotum in men. °· Difficulty breathing. °· Abdominal pain. °· Sudden weight gain. °If the condition is mild, you may not have symptoms. °DIAGNOSIS °To make a diagnosis, your health care provider will: °· Ask about your medical history. °· Perform a physical exam. °· Order imaging tests, such as an ultrasound or CT scan of your abdomen. °TREATMENT °Treatment depends on the cause of the ascites. It may include: °· Taking a pill to make you urinate. This is called a water pill (diuretic pill). °· Strictly reducing your salt (sodium) intake. Salt can cause extra fluid to be kept in the body, and this makes ascites worse. °· Having a procedure to remove fluid from your abdomen (paracentesis). °· Having a procedure to transfer fluid from your abdomen into a vein. °· Having a procedure that connects two of the major veins within your liver and relieves pressure on your liver (TIPS procedure). °Ascites may go away or improve with treatment of the condition that caused it.  °HOME CARE INSTRUCTIONS °· Keep track of your weight. To do this, weigh yourself at the same time every day and record your weight. °· Keep track of how much you drink and any changes in the amount you urinate. °· Follow any instructions that your health care provider gives you about how much to drink. °· Try not to eat salty (high-sodium)  foods. °· Take medicines only as directed by your health care provider. °· Keep all follow-up visits as directed by your health care provider. This is important. °· Report any changes in your health to your health care provider, especially if you develop new symptoms or your symptoms get worse. °SEEK MEDICAL CARE IF: °· Your gain more than 3 pounds in 3 days. °· Your abdominal size or your waist size increases. °· You have new swelling in your legs. °· The swelling in your legs gets worse. °SEEK IMMEDIATE MEDICAL CARE IF: °· You develop a fever. °· You develop confusion. °· You develop new or worsening difficulty breathing. °· You develop new or worsening abdominal pain. °· You develop new or worsening swelling in the scrotum (in men). °  °This information is not intended to replace advice given to you by your health care provider. Make sure you discuss any questions you have with your health care provider. °  °Document Released: 05/15/2005 Document Revised: 06/05/2014 Document Reviewed: 12/12/2013 °Elsevier Interactive Patient Education ©2016 [employer] ° °

## 2015-04-26 LAB — CULTURE, BLOOD (ROUTINE X 2)
CULTURE: NO GROWTH
CULTURE: NO GROWTH

## 2015-04-26 LAB — CULTURE, BODY FLUID W GRAM STAIN -BOTTLE: Culture: NO GROWTH

## 2015-04-27 ENCOUNTER — Ambulatory Visit: Payer: Self-pay | Admitting: Family Medicine

## 2015-04-28 ENCOUNTER — Ambulatory Visit: Payer: Self-pay | Attending: Family Medicine | Admitting: Family Medicine

## 2015-04-28 ENCOUNTER — Encounter (HOSPITAL_BASED_OUTPATIENT_CLINIC_OR_DEPARTMENT_OTHER): Payer: Self-pay | Admitting: Clinical

## 2015-04-28 ENCOUNTER — Encounter: Payer: Self-pay | Admitting: Family Medicine

## 2015-04-28 VITALS — BP 116/79 | HR 85 | Temp 98.2°F | Resp 14 | Ht 73.0 in | Wt 197.0 lb

## 2015-04-28 DIAGNOSIS — K7290 Hepatic failure, unspecified without coma: Secondary | ICD-10-CM | POA: Insufficient documentation

## 2015-04-28 DIAGNOSIS — D696 Thrombocytopenia, unspecified: Secondary | ICD-10-CM | POA: Insufficient documentation

## 2015-04-28 DIAGNOSIS — E871 Hypo-osmolality and hyponatremia: Secondary | ICD-10-CM | POA: Insufficient documentation

## 2015-04-28 DIAGNOSIS — F10220 Alcohol dependence with intoxication, uncomplicated: Secondary | ICD-10-CM | POA: Insufficient documentation

## 2015-04-28 DIAGNOSIS — F329 Major depressive disorder, single episode, unspecified: Secondary | ICD-10-CM

## 2015-04-28 DIAGNOSIS — E876 Hypokalemia: Secondary | ICD-10-CM | POA: Insufficient documentation

## 2015-04-28 DIAGNOSIS — F418 Other specified anxiety disorders: Secondary | ICD-10-CM

## 2015-04-28 DIAGNOSIS — Z79899 Other long term (current) drug therapy: Secondary | ICD-10-CM | POA: Insufficient documentation

## 2015-04-28 DIAGNOSIS — K7460 Unspecified cirrhosis of liver: Secondary | ICD-10-CM | POA: Insufficient documentation

## 2015-04-28 DIAGNOSIS — B199 Unspecified viral hepatitis without hepatic coma: Secondary | ICD-10-CM | POA: Insufficient documentation

## 2015-04-28 DIAGNOSIS — F17210 Nicotine dependence, cigarettes, uncomplicated: Secondary | ICD-10-CM | POA: Insufficient documentation

## 2015-04-28 DIAGNOSIS — F419 Anxiety disorder, unspecified: Secondary | ICD-10-CM

## 2015-04-28 DIAGNOSIS — J189 Pneumonia, unspecified organism: Secondary | ICD-10-CM | POA: Insufficient documentation

## 2015-04-28 DIAGNOSIS — F1010 Alcohol abuse, uncomplicated: Secondary | ICD-10-CM

## 2015-04-28 DIAGNOSIS — R600 Localized edema: Secondary | ICD-10-CM | POA: Insufficient documentation

## 2015-04-28 NOTE — Patient Instructions (Signed)
Cirrhosis °Cirrhosis is long-term (chronic) liver injury. The liver is your largest internal organ, and it performs many functions. The liver converts food into energy, removes toxic material from your blood, makes important proteins, and absorbs necessary vitamins from your diet. °If you have cirrhosis, it means many of your healthy liver cells have been replaced by scar tissue. This prevents blood from flowing through your liver, which makes it difficult for your liver to function. This scarring is not reversible, but treatment can prevent it from getting worse.  °CAUSES  °Hepatitis C and long-term alcohol abuse are the most common causes of cirrhosis. Other causes include: °· Nonalcoholic fatty liver disease. °· Hepatitis B infection. °· Autoimmune hepatitis. °· Diseases that cause blockage of ducts inside the liver. °· Inherited liver diseases. °· Reactions to certain long-term medicines. °· Parasitic infections. °· Long-term exposure to certain toxins. °RISK FACTORS °You may have a higher risk of cirrhosis if you: °· Have certain hepatitis viruses. °· Abuse alcohol, especially if you are male. °· Are overweight. °· Share needles. °· Have unprotected sex with someone who has hepatitis. °SYMPTOMS  °You may not have any signs and symptoms at first. Symptoms may not develop until the damage to your liver starts to get worse. Signs and symptoms of cirrhosis may include:  °· Tenderness in the right-upper part of your abdomen. °· Weakness and tiredness (fatigue). °· Loss of appetite. °· Nausea. °· Weight loss and muscle loss. °· Itchiness. °· Yellow skin and eyes (jaundice). °· Buildup of fluid in the abdomen (ascites). °· Swelling of the feet and ankles (edema). °· Appearance of tiny blood vessels under the skin. °· Mental confusion. °· Easy bruising and bleeding. °DIAGNOSIS  °Your health care provider may suspect cirrhosis based on your symptoms and medical history, especially if you have other medical conditions  or a history of alcohol abuse. Your health care provider will do a physical exam to feel your liver and check for signs of cirrhosis. Your health care provider may perform other tests, including:  °· Blood tests to check:   °¨ Whether you have hepatitis B or C.   °¨ Kidney function. °¨ Liver function. °· Imaging tests such as: °¨ MRI or CT scan to look for changes seen in advanced cirrhosis. °¨ Ultrasound to see if normal liver tissue is being replaced by scar tissue. °· A procedure using a long needle to take a sample of liver tissue (biopsy) for examination under a microscope. Liver biopsy can confirm the diagnosis of cirrhosis.   °TREATMENT  °Treatment depends on how damaged your liver is and what caused the damage. Treatment may include treating cirrhosis symptoms or treating the underlying causes of the condition to try to slow the progression of the damage. Treatment may include: °· Making lifestyle changes, such as:   °¨ Eating a healthy diet. °¨ Restricting salt intake.  °¨ Maintaining a healthy weight.   °¨ Not abusing drugs or alcohol. °· Taking medicines to: °¨ Treat liver infections or other infections. °¨ Control itching. °¨ Reduce fluid buildup. °¨ Reduce certain blood toxins. °¨ Reduce risk of bleeding from enlarged blood vessels in the stomach or esophagus (varices). °· If varices are causing bleeding problems, you may need treatment with a procedure that ties up the vessels causing them to fall off (band ligation). °· If cirrhosis is causing your liver to fail, your health care provider may recommend a liver transplant. °· Other treatments may be recommended depending on any complications of cirrhosis, such as liver-related kidney failure (hepatorenal   syndrome). °HOME CARE INSTRUCTIONS  °· Take medicines only as directed by your health care provider. Do not use drugs that are toxic to your liver. Ask your health care provider before taking any new medicines, including over-the-counter medicines.    °· Rest as needed. °· Eat a well-balanced diet. Ask your health care provider or dietitian for more information.   °· You may have to follow a low-salt diet or restrict your water intake as directed. °· Do not drink alcohol. This is especially important if you are taking acetaminophen. °· Keep all follow-up visits as directed by your health care provider. This is important. °SEEK MEDICAL CARE IF: °· You have fatigue or weakness that is getting worse. °· You develop swelling of the hands, feet, legs, or face. °· You have a fever. °· You develop loss of appetite. °· You have nausea or vomiting. °· You develop jaundice. °· You develop easy bruising or bleeding. °SEEK IMMEDIATE MEDICAL CARE IF: °· You vomit bright red blood or a material that looks like coffee grounds. °· You have blood in your stools. °· Your stools appear black and tarry. °· You become confused. °· You have chest pain or trouble breathing. °  °This information is not intended to replace advice given to you by your health care provider. Make sure you discuss any questions you have with your health care provider. °  °Document Released: 05/15/2005 Document Revised: 06/05/2014 Document Reviewed: 01/21/2014 °Elsevier Interactive Patient Education ©2016 [employer] ° °

## 2015-04-28 NOTE — Progress Notes (Signed)
HFU for ascites Patient states 7/10 right sided abdominal pain sharp and intermittent in nature SOB present Reports drinking mainly beer 105 beers in 7 days Started drinking at 54 yo He would like help to quit [facility] him

## 2015-04-28 NOTE — Progress Notes (Signed)
Subjective:    Patient ID: [name], male    DOB: 1960/12/22, 54 y.o.   MRN: [id number]  HPI Nicoli Nardozzi is a 54 year old male with a history of alcohol abuse, tobacco abuse,liver cirrhosis with ascites, recently managed for healthcare associated pneumonia who was hospitalized from 04/20/15-04/25/15 for decompensated liver cirrhosis.  In the last month he has had four ultrasound-guided abdominal paracentesis yielding a total of 22.5L the last of which was on 04/23/15. His blood work revealed hyponatremia of 118, hypokalemia and hypomagnesemia which were all supplemented. Lipase was mildly elevated which pointed to  pancreatitis and he was placed on pain medication. He was also placed on Augmentin because he never completed the treatment course of his health care associated pneumonia from his last discharge. His condition improved symptomatically and he was subsequently discharged.  Interval history: He is yet to apply for [facility] discount as instructed at his last office visit and has not been able to see GI yet. He also had the hard copy of his prescriptions and is yet to pick up some of them from the pharmacy. Complains of swelling in his legs and cramping occasionally; his abdomen is distended but he does not feel so uncomfortable.  Past Medical History  Diagnosis Date  . ETOH abuse   . Cirrhosis of liver [facility])     Past Surgical History  Procedure Laterality Date  . No past surgeries      jaw surgery 69yrs ago   Social History   Social History  . Marital Status: Single    Spouse Name: N/A  . Number of Children: N/A  . Years of Education: N/A   Occupational History  . Not on file.   Social History Main Topics  . Smoking status: Current Every Day Smoker -- 1.00 packs/day for 40 years    Types: Cigarettes  . Smokeless tobacco: Never Used  . Alcohol Use: 63.0 oz/week    105 Cans of beer per week     Comment: 04/28/15 states no drink in 3 weeks  . Drug Use: No  .  Sexual Activity: No   Other Topics Concern  . Not on file   Social History Narrative    No Known Allergies  Current Outpatient Prescriptions on File Prior to Visit  Medication Sig Dispense Refill  . albuterol (PROVENTIL HFA;VENTOLIN HFA) 108 (90 BASE) MCG/ACT inhaler Inhale into the lungs every 6 (six) hours as needed for wheezing or shortness of breath.    Marland Kitchen amoxicillin-clavulanate (AUGMENTIN) 875-125 MG tablet Take 1 tablet by mouth every 12 (twelve) hours. 6 tablet 0  . folic acid (FOLVITE) 1 MG tablet Take 1 tablet (1 mg total) by mouth daily. 30 tablet 0  . furosemide (LASIX) 40 MG tablet Take 1 tablet (40 mg total) by mouth daily. 30 tablet 2  . lactulose (CHRONULAC) 10 GM/15ML solution Take 15 mLs (10 g total) by mouth 2 (two) times daily. 946 mL 2  . oxyCODONE (OXY IR/ROXICODONE) 5 MG immediate release tablet Take 1 tablet (5 mg total) by mouth every 6 (six) hours as needed for moderate pain. (Patient not taking: Reported on 04/28/2015) 30 tablet 0  . pantoprazole (PROTONIX) 40 MG tablet Take 1 tablet (40 mg total) by mouth daily. 30 tablet 0  . potassium chloride SA (K-DUR,KLOR-CON) 20 MEQ tablet Take 1 tablet (20 mEq total) by mouth daily. (Patient not taking: Reported on 04/28/2015) 30 tablet 0  . spironolactone (ALDACTONE) 50 MG tablet Take 1 tablet (50 mg  total) by mouth daily. 30 tablet 0  . thiamine 100 MG tablet Take 1 tablet (100 mg total) by mouth daily. (Patient not taking: Reported on 04/21/2015) 30 tablet 0   No current facility-administered medications on file prior to visit.        Review of Systems Constitutional: Negative for fever, chills, diaphoresis, activity change, appetite change and fatigue. HENT: Negative for ear pain, nosebleeds, congestion, facial swelling, rhinorrhea, neck pain, neck stiffness and ear discharge.  Eyes: Negative for pain, discharge, redness, itching and visual disturbance. Respiratory: Negative for cough, choking, chest tightness,  shortness of breath, wheezing and stridor.  Cardiovascular: Negative for chest pain, palpitations and positive for leg swelling. Gastrointestinal: Positive for abdominal distention. Genitourinary: Negative for dysuria, urgency, frequency, hematuria, flank pain, decreased urine volume, difficulty urinating and dyspareunia.  Musculoskeletal: Negative for back pain, positive for leg swelling and leg cramps Neurological: Negative for dizziness, tremors, seizures, syncope, facial asymmetry, speech difficulty, weakness, light-headedness, numbness and headaches.  Hematological: Negative for adenopathy. Does not bruise/bleed easily. Psychiatric/Behavioral: Negative for hallucinations, behavioral problems, confusion, dysphoric mood, decreased concentration and agitation.      Objective: Filed Vitals:   04/28/15 1149  BP: 116/79  Pulse: 85  Temp: 98.2 F (36.8 C)  Resp: 14  Height: 6\' 1"  (1.854 m)  Weight: 197 lb (89.359 kg)  SpO2: 100%      Physical Exam Constitutional: Patient appears, chronically ill looking HENT: Normocephalic, atraumatic, External right and left ear normal. Oropharynx is clear and moist.  Eyes: mild scleral icterus. Neck: Normal ROM. Neck supple. No JVD. No tracheal deviation. No thyromegaly. CVS: RRR, S1/S2 +, no murmurs, no gallops, no carotid bruit.  Pulmonary: Effort and breath sounds normal, no stridor, rhonchi, wheezes, rales.  Abdominal: Massive ascites, abdominal girth is 44 inches  Musculoskeletal: 3+ bilateral pedal edema up to the thighs Lymphadenopathy: No lymphadenopathy noted, cervical, inguinal or axillary Neuro: Alert. Normal reflexes, muscle tone coordination. No cranial nerve deficit. Skin: Skin is warm and dry. No rash noted. Not diaphoretic. No erythema. No pallor. Psychiatric: Normal mood and affect. Behavior, judgment, thought content normal.        Assessment & Plan:  Liver cirrhosis with hepatitis: MELD score = 7 Continue lactulose He  is not in any significant distress at this time but will likely need an abdominal paracentesis as his next visit as well as repeat labs I have emphasized the need to [facility] so we can refer him to GI for optimization of management.  Pedal edema: Advised to pickup spironolactone which he is to take in conjunction with Lasix I have refilled this and advised him to elevate his legs.  Healthcare associated pneumonia: Currently on Augmentin  Hypokalemia: Review of his medications reveal he is not taking potassium By the taking spironolactone and he has a hard copy of the prescription with him. We'll hold off on ordering labs today because his potassium will still be low and I have emphasized the need for him to pick up his medications and stopped taking them today. I will send off labs at his next visit.  Alcohol abuse: The patient says he has quit alcohol. LCSW called in to see the patient for counseling session and to help link him up with community resources and Alcoholics Anonymous information.  Tobacco abuse: Spent 3 minutes counseling on cessation and he is not ready to quit yet.   Holding off on ordering labs today as he was just discharged 3 days ago  and is yet to pick up some of his medications. At his next visit he will be needing a CBC, complete metabolic panel, magnesium level, ammonia level.  This note has been created with Education officer, environmental. Any transcriptional errors are unintentional.

## 2015-04-28 NOTE — Progress Notes (Signed)
ASSESSMENT: Pt currently experiencing alcohol abuse, as well as sympoms of anxiety and depression. Pt needs [facility] f[facility]; would benefit from alcohol treatment, may benefit from supportive counseling and psychoeducation regarding healthy coping with symptoms of depression.  Stage of Change: precontemplative/ contemplative  PLAN: 1. F/U with behavioral health consultant in as needed 2. Psychiatric Medications: none. 3. Behavioral recommendation(s):   -Go to AA meeting on [address] -Consider calling [facility] for inpatient treatment, as needed -Consider reading educational material regarding coping with symptoms of depression SUBJECTIVE: Pt. referred by Dr Venetia NightAmao for alcohol :  Pt. reports the following symptoms/concerns: Pt states that he has been in "several" alcohol treatment programs, and is most interested in going to AA meetings again, as that was most helpful to him; he would consider [facility] inpatient if he feels like he wants to drink again, has not had a drink in three weeks. He states that he has felt depressed for many years, tried antidepressant once for three days, didn't like it, and self-medicated the depression with alcohol.  Duration of problem: Motivation in past week Severity: severe  OBJECTIVE: Orientation & Cognition: Oriented x3. Thought processes normal and appropriate to situation. Mood: low. Affect: appropriate Appearance: appropriate Risk of harm to self or others: no intentional harm to self or others today Substance use: alcohol Assessments administered: PHQ9: 21/GAD7: 21  Diagnosis: Alcohol abuse/ Anxiety and depression CPT Code: F10.10 / F41.8  -------------------------------------------- Other(s) present in the room: none  Time spent with patient in exam room: 10 minutes

## 2015-05-01 ENCOUNTER — Encounter (HOSPITAL_COMMUNITY): Payer: Self-pay | Admitting: Emergency Medicine

## 2015-05-01 ENCOUNTER — Emergency Department (HOSPITAL_COMMUNITY)
Admission: EM | Admit: 2015-05-01 | Discharge: 2015-05-02 | Disposition: A | Discharge status: Home / Self Care | Payer: Self-pay | Attending: Emergency Medicine | Admitting: Emergency Medicine

## 2015-05-01 DIAGNOSIS — R1084 Generalized abdominal pain: Secondary | ICD-10-CM

## 2015-05-01 DIAGNOSIS — R188 Other ascites: Secondary | ICD-10-CM

## 2015-05-01 DIAGNOSIS — Z79899 Other long term (current) drug therapy: Secondary | ICD-10-CM | POA: Insufficient documentation

## 2015-05-01 DIAGNOSIS — F1010 Alcohol abuse, uncomplicated: Secondary | ICD-10-CM | POA: Insufficient documentation

## 2015-05-01 DIAGNOSIS — F17210 Nicotine dependence, cigarettes, uncomplicated: Secondary | ICD-10-CM | POA: Insufficient documentation

## 2015-05-01 DIAGNOSIS — K7031 Alcoholic cirrhosis of liver with ascites: Secondary | ICD-10-CM | POA: Insufficient documentation

## 2015-05-01 LAB — CBC WITH DIFFERENTIAL/PLATELET
BASOS ABS: 0.1 10*3/uL (ref 0.0–0.1)
BASOS PCT: 1 %
EOS ABS: 0.3 10*3/uL (ref 0.0–0.7)
Eosinophils Relative: 3 %
HEMATOCRIT: 39.6 % (ref 39.0–52.0)
HEMOGLOBIN: 13.9 g/dL (ref 13.0–17.0)
Lymphocytes Relative: 24 %
Lymphs Abs: 2.2 10*3/uL (ref 0.7–4.0)
MCH: 35.4 pg — ABNORMAL HIGH (ref 26.0–34.0)
MCHC: 35.1 g/dL (ref 30.0–36.0)
MCV: 100.8 fL — ABNORMAL HIGH (ref 78.0–100.0)
Monocytes Absolute: 1.1 10*3/uL — ABNORMAL HIGH (ref 0.1–1.0)
Monocytes Relative: 12 %
NEUTROS ABS: 5.4 10*3/uL (ref 1.7–7.7)
NEUTROS PCT: 60 %
Platelets: 137 10*3/uL — ABNORMAL LOW (ref 150–400)
RBC: 3.93 MIL/uL — ABNORMAL LOW (ref 4.22–5.81)
RDW: 12.8 % (ref 11.5–15.5)
WBC: 9.1 10*3/uL (ref 4.0–10.5)

## 2015-05-01 LAB — COMPREHENSIVE METABOLIC PANEL
ALBUMIN: 2.2 g/dL — AB (ref 3.5–5.0)
ALK PHOS: 94 U/L (ref 38–126)
ALT: 17 U/L (ref 17–63)
AST: 30 U/L (ref 15–41)
Anion gap: 6 (ref 5–15)
BILIRUBIN TOTAL: 1 mg/dL (ref 0.3–1.2)
BUN: 6 mg/dL (ref 6–20)
CO2: 25 mmol/L (ref 22–32)
Calcium: 8.2 mg/dL — ABNORMAL LOW (ref 8.9–10.3)
Chloride: 94 mmol/L — ABNORMAL LOW (ref 101–111)
Creatinine, Ser: 0.82 mg/dL (ref 0.61–1.24)
GFR calc Af Amer: >60 mL/min (ref >60)
GFR calc non Af Amer: >60 mL/min (ref >60)
GLUCOSE: 117 mg/dL — AB (ref 65–99)
POTASSIUM: 4.2 mmol/L (ref 3.5–5.1)
SODIUM: 125 mmol/L — AB (ref 135–145)
TOTAL PROTEIN: 6.1 g/dL — AB (ref 6.5–8.1)

## 2015-05-01 LAB — PROTIME-INR
INR: 1.36 (ref 0.00–1.49)
Prothrombin Time: 16.9 seconds — ABNORMAL HIGH (ref 11.6–15.2)

## 2015-05-01 LAB — LIPASE, BLOOD: Lipase: 60 U/L — ABNORMAL HIGH (ref 11–51)

## 2015-05-01 LAB — APTT: APTT: 36 s (ref 24–37)

## 2015-05-01 MED ORDER — SODIUM CHLORIDE 0.9 % IV SOLN
INTRAVENOUS | Status: DC
  Administered 2015-05-02: 20 mL/h via INTRAVENOUS

## 2015-05-01 NOTE — ED Provider Notes (Signed)
CSN: [id number]     Arrival date & time 05/01/15  2212 History  By signing my name below, I, Ronney LionSuzanne Le, attest that this documentation has been prepared under the direction and in the presence of Gilda Creasehristopher J Pollina, MD. Electronically Signed: Ronney LionSuzanne Le, ED Scribe. 05/01/2015. 1:28 AM.  Chief Complaint  Patient presents with  . Abdominal Pain  . Cirrhosis   The history is provided by the patient. No language interpreter was used.   HPI Comments: [name] is a 54 y.o. male with a history of EtOH abuse and cirrhosis of the liver, who presents to the Emergency Department complaining of recurrent, constant, worsening abdominal pain that began today. No associated factors were noted. Patient states he was in the hospital 6 days ago for a paracentesis. No modifying factors were noted.   Past Medical History  Diagnosis Date  . ETOH abuse   . Cirrhosis of liver [facility](HCC)    Past Surgical History  Procedure Laterality Date  . No past surgeries      jaw surgery 5088yrs ago   Family History  Problem Relation Age of Onset  . Diabetes Mother    Social History  Substance Use Topics  . Smoking status: Current Every Day Smoker -- 1.00 packs/day for 40 years    Types: Cigarettes  . Smokeless tobacco: Never Used  . Alcohol Use: 63.0 oz/week    105 Cans of beer per week     Comment: 04/28/15 states no drink in 3 weeks    Review of Systems  Gastrointestinal: Positive for abdominal pain.  All other systems reviewed and are negative.  Allergies  Review of patient's allergies indicates no known allergies.  Home Medications   Prior to Admission medications   Medication Sig Start Date End Date Taking? Authorizing Provider  albuterol (PROVENTIL HFA;VENTOLIN HFA) 108 (90 BASE) MCG/ACT inhaler Inhale into the lungs every 6 (six) hours as needed for wheezing or shortness of breath.    Historical Provider, MD  amoxicillin-clavulanate (AUGMENTIN) 875-125 MG tablet Take 1 tablet by mouth every 12  (twelve) hours. 04/25/15   Dorothea OgleIskra M Myers, MD  folic acid (FOLVITE) 1 MG tablet Take 1 tablet (1 mg total) by mouth daily. 04/15/15   Elease EtienneAnand D Hongalgi, MD  furosemide (LASIX) 40 MG tablet Take 1 tablet (40 mg total) by mouth daily. 04/25/15   Dorothea OgleIskra M Myers, MD  lactulose (CHRONULAC) 10 GM/15ML solution Take 15 mLs (10 g total) by mouth 2 (two) times daily. 04/25/15   Dorothea OgleIskra M Myers, MD  oxyCODONE (OXY IR/ROXICODONE) 5 MG immediate release tablet Take 1 tablet (5 mg total) by mouth every 6 (six) hours as needed for moderate pain. Patient not taking: Reported on 04/28/2015 04/25/15   Dorothea OgleIskra M Myers, MD  pantoprazole (PROTONIX) 40 MG tablet Take 1 tablet (40 mg total) by mouth daily. 04/15/15   Elease EtienneAnand D Hongalgi, MD  potassium chloride SA (K-DUR,KLOR-CON) 20 MEQ tablet Take 1 tablet (20 mEq total) by mouth daily. Patient not taking: Reported on 04/28/2015 04/25/15   Dorothea OgleIskra M Myers, MD  spironolactone (ALDACTONE) 50 MG tablet Take 1 tablet (50 mg total) by mouth daily. 04/25/15   Dorothea OgleIskra M Myers, MD  thiamine 100 MG tablet Take 1 tablet (100 mg total) by mouth daily. Patient not taking: Reported on 04/21/2015 04/15/15   Elease EtienneAnand D Hongalgi, MD   BP 108/89 mmHg  Pulse 93  Temp(Src) 98.3 F (36.8 C) (Oral)  Resp 17  SpO2 100% Physical Exam  Constitutional: He  is oriented to person, place, and time. He appears well-developed and well-nourished. No distress.  HENT:  Head: Normocephalic and atraumatic.  Right Ear: Hearing normal.  Left Ear: Hearing normal.  Nose: Nose normal.  Mouth/Throat: Oropharynx is clear and moist and mucous membranes are normal.  Eyes: Conjunctivae and EOM are normal. Pupils are equal, round, and reactive to light.  Neck: Normal range of motion. Neck supple.  Cardiovascular: Regular rhythm, S1 normal and S2 normal.  Exam reveals no gallop and no friction rub.   No murmur heard. Pulmonary/Chest: Effort normal and breath sounds normal. No respiratory distress. He exhibits no  tenderness.  Abdominal: Soft. Normal appearance and bowel sounds are normal. He exhibits distension (mild). There is no hepatosplenomegaly. There is tenderness. There is no rebound, no guarding, no tenderness at McBurney's point and negative Murphy's sign. No hernia.  Abdominal distention. Diffuse tenderness.  Musculoskeletal: Normal range of motion.  Neurological: He is alert and oriented to person, place, and time. He has normal strength. No cranial nerve deficit or sensory deficit. Coordination normal. GCS eye subscore is 4. GCS verbal subscore is 5. GCS motor subscore is 6.  Skin: Skin is warm, dry and intact. No rash noted. No cyanosis.  Psychiatric: He has a normal mood and affect. His speech is normal and behavior is normal. Thought content normal.  Nursing note and vitals reviewed.   ED Course  Procedures (including critical care time)  DIAGNOSTIC STUDIES: Oxygen Saturation is 100% on RA, normal by my interpretation.    COORDINATION OF CARE: 11:33 PM - Discussed treatment plan with pt at bedside which includes diagnostic tests. Pt verbalized understanding and agreed to plan.   Labs Review Labs Reviewed  CBC WITH DIFFERENTIAL/PLATELET - Abnormal; Notable for the following:    RBC 3.93 (*)    MCV 100.8 (*)    MCH 35.4 (*)    Platelets 137 (*)    Monocytes Absolute 1.1 (*)    All other components within normal limits  COMPREHENSIVE METABOLIC PANEL - Abnormal; Notable for the following:    Sodium 125 (*)    Chloride 94 (*)    Glucose, Bld 117 (*)    Calcium 8.2 (*)    Total Protein 6.1 (*)    Albumin 2.2 (*)    All other components within normal limits  LIPASE, BLOOD - Abnormal; Notable for the following:    Lipase 60 (*)    All other components within normal limits  PROTIME-INR - Abnormal; Notable for the following:    Prothrombin Time 16.9 (*)    All other components within normal limits  APTT  AMMONIA    Imaging Review No results found. I have personally  reviewed and evaluated these images and lab results as part of my medical decision-making.   EKG Interpretation None      MDM   Final diagnoses:  None   ascites Cirrhosis  Presents to the ER for evaluation of abdominal pain. Patient was hospitalized week ago for ascites. Paracentesis was performed at that time, cultures have been negative. Patient reports that he has had pain since discharge. He has not been able to fill his prescription for pain medication he was prescribed at time of discharge. Examination revealed very mild distention without any signs of peritonitis. He is afebrile. Lab work is entirely normal for him. There is nothing to indicate spontaneous bacterial peritonitis in this patient. Pain appears to be chronic in nature. He will need to follow-up as an outpatient with  GI.    Gilda Crease, MD 05/02/15 [phone]

## 2015-05-01 NOTE — ED Notes (Signed)
Pt from home via [facility] c/o abdominal pain and leg pain. Hx of Cirrhosis. Pt's abdomen is distended. He recently had a paracentesis.

## 2015-05-02 LAB — AMMONIA: AMMONIA: 35 umol/L (ref 9–35)

## 2015-05-05 ENCOUNTER — Emergency Department (HOSPITAL_COMMUNITY)
Admission: EM | Admit: 2015-05-05 | Discharge: 2015-05-06 | Disposition: A | Discharge status: Home / Self Care | Payer: Self-pay | Attending: Emergency Medicine | Admitting: Emergency Medicine

## 2015-05-05 ENCOUNTER — Emergency Department (HOSPITAL_COMMUNITY): Payer: Self-pay

## 2015-05-05 ENCOUNTER — Encounter (HOSPITAL_COMMUNITY): Payer: Self-pay | Admitting: Emergency Medicine

## 2015-05-05 DIAGNOSIS — R079 Chest pain, unspecified: Secondary | ICD-10-CM | POA: Insufficient documentation

## 2015-05-05 DIAGNOSIS — R140 Abdominal distension (gaseous): Secondary | ICD-10-CM | POA: Insufficient documentation

## 2015-05-05 DIAGNOSIS — Z8719 Personal history of other diseases of the digestive system: Secondary | ICD-10-CM | POA: Insufficient documentation

## 2015-05-05 DIAGNOSIS — Z792 Long term (current) use of antibiotics: Secondary | ICD-10-CM | POA: Insufficient documentation

## 2015-05-05 DIAGNOSIS — F17210 Nicotine dependence, cigarettes, uncomplicated: Secondary | ICD-10-CM | POA: Insufficient documentation

## 2015-05-05 DIAGNOSIS — R1011 Right upper quadrant pain: Secondary | ICD-10-CM | POA: Insufficient documentation

## 2015-05-05 DIAGNOSIS — R0602 Shortness of breath: Secondary | ICD-10-CM | POA: Insufficient documentation

## 2015-05-05 DIAGNOSIS — R42 Dizziness and giddiness: Secondary | ICD-10-CM | POA: Insufficient documentation

## 2015-05-05 DIAGNOSIS — R188 Other ascites: Secondary | ICD-10-CM | POA: Insufficient documentation

## 2015-05-05 DIAGNOSIS — Z79899 Other long term (current) drug therapy: Secondary | ICD-10-CM | POA: Insufficient documentation

## 2015-05-05 DIAGNOSIS — R0600 Dyspnea, unspecified: Secondary | ICD-10-CM | POA: Insufficient documentation

## 2015-05-05 DIAGNOSIS — R05 Cough: Secondary | ICD-10-CM | POA: Insufficient documentation

## 2015-05-05 LAB — I-STAT CG4 LACTIC ACID, ED: LACTIC ACID, VENOUS: 1.28 mmol/L (ref 0.5–2.0)

## 2015-05-05 NOTE — ED Notes (Signed)
Bed: WA01 Expected date:  Expected time:  Means of arrival:  Comments: EMS swelling

## 2015-05-05 NOTE — ED Provider Notes (Signed)
CSN: [id number]     Arrival date & time 05/05/15  2317 History  By signing my name below, I, Budd Palmer, attest that this documentation has been prepared under the direction and in the 1:38 AM.     Chief Complaint  Patient presents with  . Abdominal/Leg Swleling    The history is provided by the patient. No language interpreter was used.   HPI Comments: [name] is a 54 y.o. male smoker at 1 ppd with a PMHx of cirrhosis of the liver and EtOH abuse who presents to the Emergency Department complaining of recurrent, worsening, abdominal distension and bilateral leg swelling. He reports associated abdominal pain, SOB, right upper chest pain exacerbated with coughing, as well as dizziness and dyspnea on exertion. He notes a PMHx of cirrhosis due to alcohol and states he regularly has fluid removed at the hospital every 2 weeks, but states a week after the procedure the swelling begins to return. He states he has never had to be admitted for this. He notes that at his most recent procedure (in the ED on 11/24) about 8 L of fluid were removed. He reports he has not had any alcohol for "well over a month." He notes he does not have insurance. Pt denies fever and nausea.   Past Medical History  Diagnosis Date  . ETOH abuse   . Cirrhosis of liver [facility])    Past Surgical History  Procedure Laterality Date  . No past surgeries      jaw surgery 49yrs ago   Family History  Problem Relation Age of Onset  . Diabetes Mother    Social History  Substance Use Topics  . Smoking status: Current Every Day Smoker -- 1.00 packs/day for 40 years    Types: Cigarettes  . Smokeless tobacco: Never Used  . Alcohol Use: 63.0 oz/week    105 Cans of beer per week     Comment: 04/28/15 states no drink in 3 weeks    Review of Systems A complete 10 system review of systems was obtained and all systems are negative except as noted in the HPI and PMH.   Allergies  Review of patient's allergies indicates no  known allergies.  Home Medications   Prior to Admission medications   Medication Sig Start Date End Date Taking? Authorizing Provider  albuterol (PROVENTIL HFA;VENTOLIN HFA) 108 (90 BASE) MCG/ACT inhaler Inhale 2 puffs into the lungs every 6 (six) hours as needed for wheezing or shortness of breath.    Yes Historical Provider, MD  amoxicillin-clavulanate (AUGMENTIN) 875-125 MG tablet Take 1 tablet by mouth every 12 (twelve) hours. 04/25/15  Yes Dorothea Ogle, MD  folic acid (FOLVITE) 1 MG tablet Take 1 tablet (1 mg total) by mouth daily. 04/15/15  Yes Elease Etienne, MD  furosemide (LASIX) 40 MG tablet Take 1 tablet (40 mg total) by mouth daily. 04/25/15  Yes Dorothea Ogle, MD  lactulose (CHRONULAC) 10 GM/15ML solution Take 15 mLs (10 g total) by mouth 2 (two) times daily. 04/25/15  Yes Dorothea Ogle, MD  pantoprazole (PROTONIX) 40 MG tablet Take 1 tablet (40 mg total) by mouth daily. 04/15/15  Yes Elease Etienne, MD  potassium chloride SA (K-DUR,KLOR-CON) 20 MEQ tablet Take 1 tablet (20 mEq total) by mouth daily. 04/25/15  Yes Dorothea Ogle, MD  spironolactone (ALDACTONE) 50 MG tablet Take 1 tablet (50 mg total) by mouth daily. 04/25/15  Yes Dorothea Ogle, MD  oxyCODONE (OXY IR/ROXICODONE) 5 MG  immediate release tablet Take 1 tablet (5 mg total) by mouth every 6 (six) hours as needed for moderate pain. Patient not taking: Reported on 04/28/2015 04/25/15   Dorothea OgleIskra M Myers, MD  thiamine 100 MG tablet Take 1 tablet (100 mg total) by mouth daily. 04/15/15   Elease EtienneAnand D Hongalgi, MD   BP 113/86 mmHg  Pulse 94  Temp(Src) 97.8 F (36.6 C) (Oral)  Resp 20  SpO2 98% Physical Exam  Constitutional: He is oriented to person, place, and time. Vital signs are normal. He appears well-developed and well-nourished.  Non-toxic appearance. He does not appear ill. No distress.  HENT:  Head: Normocephalic and atraumatic.  Nose: Nose normal.  Mouth/Throat: Oropharynx is clear and moist. No oropharyngeal  exudate.  Eyes: Conjunctivae and EOM are normal. Pupils are equal, round, and reactive to light. No scleral icterus.  Neck: Normal range of motion. Neck supple. No tracheal deviation, no edema, no erythema and normal range of motion present. No thyroid mass and no thyromegaly present.  Cardiovascular: Normal rate, regular rhythm, S1 normal, S2 normal, normal heart sounds, intact distal pulses and normal pulses.  Exam reveals no gallop and no friction rub.   No murmur heard. Pulmonary/Chest: Effort normal and breath sounds normal. No respiratory distress. He has no wheezes. He has no rhonchi. He has no rales.  Abdominal: Soft. Normal appearance and bowel sounds are normal. He exhibits distension. He exhibits no ascites and no mass. There is no hepatosplenomegaly. There is tenderness. There is no rebound, no guarding and no CVA tenderness.  RUQ TTP  Musculoskeletal: Normal range of motion. He exhibits edema. He exhibits no tenderness.  BLE edema  Lymphadenopathy:    He has no cervical adenopathy.  Neurological: He is alert and oriented to person, place, and time. He has normal strength. No cranial nerve deficit or sensory deficit.  Skin: Skin is warm, dry and intact. No petechiae and no rash noted. He is not diaphoretic. No erythema. No pallor.  Psychiatric: He has a normal mood and affect. His behavior is normal. Judgment normal.  Nursing note and vitals reviewed.   ED Course  Procedures  DIAGNOSTIC STUDIES: Oxygen Saturation is 99% on RA, normal by my interpretation.    COORDINATION OF CARE: 11:35 PM - Discussed plans to order diagnostic studies. Advised pt may need to be admitted. Will refer to a clinic for bi-weekly paracentesis. Pt advised of plan for treatment and pt agrees.   Diagnostic & Therapeutic Paracentesis, U/S guided Procedure note  Performed [id number]:Amalio Loe Mora Bellmanni, MD at 1:22 AM  CONSENT:  Informed consent was obtained after risks and benefits were explained at length.    PROCEDURE SUMMARY:  A pre-procedural time-out was performed. The area of the LEFT abdomen was prepped and draped in a sterile fashion using Povoidone scrub. Lidocaine-epinephrine (Xilocaine w/Epi) 1% was used to numb the region.  The paracentesis catheter was inserted and advanced with negative pressure under ultrasound guidance. No blood was aspirated. Clear, yellow fluid was retrieved and collected. Approximately 500cc of ascitic fluid was collected and sent for laboratory analysis. The patient tolerated the procedure well without any immediate complications. Tomasita CrumbleAdeleke Zafirah Vanzee, MD was present during the procedure.   Labs Review Labs Reviewed  CBC WITH DIFFERENTIAL/PLATELET - Abnormal; Notable for the following:    RBC 4.13 (*)    MCV 100.5 (*)    MCH 34.1 (*)    Monocytes Absolute 1.1 (*)    All other components within normal limits  COMPREHENSIVE METABOLIC PANEL -  Abnormal; Notable for the following:    Sodium 130 (*)    Chloride 99 (*)    Glucose, Bld 110 (*)    Calcium 8.4 (*)    Total Protein 6.4 (*)    Albumin 2.3 (*)    ALT 16 (*)    Total Bilirubin 1.6 (*)    All other components within normal limits  LIPASE, BLOOD - Abnormal; Notable for the following:    Lipase 56 (*)    All other components within normal limits  BODY FLUID CELL COUNT WITH DIFFERENTIAL - Abnormal; Notable for the following:    Appearance, Fluid HAZY (*)    All other components within normal limits  BODY FLUID CULTURE  GRAM STAIN  PROTIME-INR  PROTEIN, BODY FLUID  AMYLASE, PERITONEAL FLUID  LACTATE DEHYDROGENASE, BODY FLUID  GLUCOSE, SEROUS FLUID  I-STAT CG4 LACTIC ACID, ED    Imaging Review Dg Chest 2 View  05/06/2015  CLINICAL DATA:  54 year old male with cough and shortness of breath EXAM: CHEST  2 VIEW COMPARISON:  Chest radiograph dated 04/17/2015 FINDINGS: Two views of the chest demonstrate emphysematous changes of the lungs. No focal consolidation, pleural effusion, or pneumothorax. There has  been interval resolution of the previously seen left lower lobe density. Stable cardiac silhouette. The osseous structures are grossly unremarkable IMPRESSION: No active cardiopulmonary disease. Emphysema. Electronically Signed   By: Elgie Collard M.D.   On: 05/06/2015 01:39   I have personally reviewed and evaluated these images and lab results as part of my medical decision-making.   EKG Interpretation None      MDM   Final diagnoses:  None    Patient presents to the emergency department for evaluation of abdominal pain. In the setting of ascites and cirrhosis, SBP is possible. Paracentesis was performed. Patient also complains of swelling in his legs. He states he has never been on Lasix. He was given IV dose emergency department. Patient was given referral to gastroenterology for further care. Will send home with Lasix to take and potassium daily.  Paracentesis results show a neutrophil count of 20 well below the range for SBP. He appears well in no acute distress, vital signs were within his normal limits and is safe for discharge.   I personally performed the services described in this documentation, which was scribed in my presence. The recorded information has been reviewed and is accurate.     Tomasita Crumble, MD 05/06/15 [phone]

## 2015-05-05 NOTE — ED Notes (Addendum)
Per EMS pt hx of cirrhosis and COPD; pt co pain to abdomen and legs as a result of swelling; went to hopsital 1 week ago to have fluid pulled off but staff told him there was no enough; pt present with mild SOB and nonproductive cough; Per EMS lungs clear throughout all bases; pt from [facility]

## 2015-05-06 ENCOUNTER — Emergency Department (HOSPITAL_COMMUNITY): Payer: Self-pay

## 2015-05-06 LAB — GLUCOSE, SEROUS FLUID: Glucose, Fluid: 112 mg/dL

## 2015-05-06 LAB — BODY FLUID CELL COUNT WITH DIFFERENTIAL
LYMPHS FL: 29 %
MONOCYTE-MACROPHAGE-SEROUS FLUID: 52 % (ref 50–90)
NEUTROPHIL FLUID: 19 % (ref 0–25)
Total Nucleated Cell Count, Fluid: 123 cu mm (ref 0–1000)

## 2015-05-06 LAB — COMPREHENSIVE METABOLIC PANEL
ALBUMIN: 2.3 g/dL — AB (ref 3.5–5.0)
ALK PHOS: 90 U/L (ref 38–126)
ALT: 16 U/L — AB (ref 17–63)
AST: 29 U/L (ref 15–41)
Anion gap: 6 (ref 5–15)
BUN: 9 mg/dL (ref 6–20)
CALCIUM: 8.4 mg/dL — AB (ref 8.9–10.3)
CO2: 25 mmol/L (ref 22–32)
CREATININE: 0.73 mg/dL (ref 0.61–1.24)
Chloride: 99 mmol/L — ABNORMAL LOW (ref 101–111)
GFR calc Af Amer: >60 mL/min (ref >60)
GFR calc non Af Amer: >60 mL/min (ref >60)
GLUCOSE: 110 mg/dL — AB (ref 65–99)
Potassium: 3.8 mmol/L (ref 3.5–5.1)
SODIUM: 130 mmol/L — AB (ref 135–145)
Total Bilirubin: 1.6 mg/dL — ABNORMAL HIGH (ref 0.3–1.2)
Total Protein: 6.4 g/dL — ABNORMAL LOW (ref 6.5–8.1)

## 2015-05-06 LAB — GRAM STAIN

## 2015-05-06 LAB — CBC WITH DIFFERENTIAL/PLATELET
BASOS PCT: 1 %
Basophils Absolute: 0.1 10*3/uL (ref 0.0–0.1)
EOS ABS: 0.2 10*3/uL (ref 0.0–0.7)
Eosinophils Relative: 2 %
HCT: 41.5 % (ref 39.0–52.0)
HEMOGLOBIN: 14.1 g/dL (ref 13.0–17.0)
Lymphocytes Relative: 21 %
Lymphs Abs: 2.2 10*3/uL (ref 0.7–4.0)
MCH: 34.1 pg — ABNORMAL HIGH (ref 26.0–34.0)
MCHC: 34 g/dL (ref 30.0–36.0)
MCV: 100.5 fL — ABNORMAL HIGH (ref 78.0–100.0)
Monocytes Absolute: 1.1 10*3/uL — ABNORMAL HIGH (ref 0.1–1.0)
Monocytes Relative: 11 %
NEUTROS PCT: 65 %
Neutro Abs: 6.7 10*3/uL (ref 1.7–7.7)
Platelets: 168 10*3/uL (ref 150–400)
RBC: 4.13 MIL/uL — AB (ref 4.22–5.81)
RDW: 13 % (ref 11.5–15.5)
WBC: 10.3 10*3/uL (ref 4.0–10.5)

## 2015-05-06 LAB — PROTIME-INR
INR: 1.17 (ref 0.00–1.49)
Prothrombin Time: 15.1 seconds (ref 11.6–15.2)

## 2015-05-06 LAB — LIPASE, BLOOD: Lipase: 56 U/L — ABNORMAL HIGH (ref 11–51)

## 2015-05-06 LAB — LACTATE DEHYDROGENASE, PLEURAL OR PERITONEAL FLUID: LD, Fluid: 23 U/L (ref 3–23)

## 2015-05-06 LAB — PROTEIN, BODY FLUID

## 2015-05-06 LAB — AMYLASE, PERITONEAL FLUID: Amylase, peritoneal fluid: 44 U/L

## 2015-05-06 LAB — PATHOLOGIST SMEAR REVIEW

## 2015-05-06 MED ORDER — POTASSIUM CHLORIDE CRYS ER 20 MEQ PO TBCR: 20.0000 meq | EXTENDED_RELEASE_TABLET | Freq: Every day | ORAL | Status: DC

## 2015-05-06 MED ORDER — LIDOCAINE-EPINEPHRINE 1 %-1:100000 IJ SOLN
INTRAMUSCULAR | Status: AC
  Administered 2015-05-06: 5 mL
  Filled 2015-05-06: qty 1

## 2015-05-06 MED ORDER — FUROSEMIDE 40 MG PO TABS: 40.0000 mg | ORAL_TABLET | Freq: Every day | ORAL | Status: DC

## 2015-05-06 MED ORDER — FUROSEMIDE 10 MG/ML IJ SOLN
80.0000 mg | Freq: Once | INTRAMUSCULAR | Status: AC
  Administered 2015-05-06: 80 mg via INTRAVENOUS
  Filled 2015-05-06: qty 8

## 2015-05-06 NOTE — ED Notes (Signed)
Pt ambulatory and stable. Pt will remain in room until buses begin running. Pt has bus pass along with his discharge instructions.

## 2015-05-06 NOTE — Discharge Instructions (Signed)
Paracentesis, Care After Mr. Burnadette PeterLynch, take Lasix and potassium every day to decrease swelling in your legs. See gastroenterology within 3 days for close follow-up. If any symptoms worsen come back to emergency department immediately. Thank you. Refer to this sheet in the next few weeks. These instructions provide you with information about caring for yourself after your procedure. Your health care provider may also give you more specific instructions. Your treatment has been planned according to current medical practices, but problems sometimes occur. Call your health care provider if you have any problems or questions after your procedure. WHAT TO EXPECT AFTER THE PROCEDURE After your procedure, it is common to have a small amount of clear fluid coming from the puncture site. HOME CARE INSTRUCTIONS  Return to your normal activities as told by your health care provider. Ask your health care provider what activities are safe for you.  Take over-the-counter and prescription medicines only as told by your health care provider.  Do not take baths, swim, or use a hot tub until your health care provider approves.  Follow instructions from your health care provider about:  How to take care of your puncture site.  When and how you should change your bandage (dressing).  When you should remove your dressing.  Check your puncture area every day signs of infection. Watch for:  Redness, swelling, or pain.  Fluid, blood, or pus.  Keep all follow-up visits as told by your health care provider. This is important. SEEK MEDICAL CARE IF:  You have redness, swelling, or pain at your puncture site.  You start to have more clear fluid coming from your puncture site.  You have blood or pus coming from your puncture site.  You have chills.  You have a fever. SEEK IMMEDIATE MEDICAL CARE IF:  You develop chest pain or shortness of breath.  You develop increasing pain, discomfort, or swelling in your  abdomen.  You feel dizzy or light-headed or you pass out.   This information is not intended to replace advice given to you by your health care provider. Make sure you discuss any questions you have with your health care provider.   Document Released: 09/29/2014 Document Reviewed: 09/29/2014 Elsevier Interactive Patient Education [employer]2016 [employer]

## 2015-05-07 ENCOUNTER — Ambulatory Visit: Payer: Self-pay | Admitting: Family Medicine

## 2015-05-07 ENCOUNTER — Encounter: Payer: Self-pay | Admitting: Family Medicine

## 2015-05-07 ENCOUNTER — Ambulatory Visit: Payer: Self-pay | Attending: Family Medicine | Admitting: Family Medicine

## 2015-05-07 VITALS — BP 109/77 | HR 89 | Temp 97.4°F | Resp 16 | Ht 73.0 in | Wt 208.0 lb

## 2015-05-07 DIAGNOSIS — E876 Hypokalemia: Secondary | ICD-10-CM | POA: Insufficient documentation

## 2015-05-07 DIAGNOSIS — K7031 Alcoholic cirrhosis of liver with ascites: Secondary | ICD-10-CM | POA: Insufficient documentation

## 2015-05-07 DIAGNOSIS — M7989 Other specified soft tissue disorders: Secondary | ICD-10-CM | POA: Insufficient documentation

## 2015-05-07 DIAGNOSIS — Z79899 Other long term (current) drug therapy: Secondary | ICD-10-CM | POA: Insufficient documentation

## 2015-05-07 DIAGNOSIS — Z720 Tobacco use: Secondary | ICD-10-CM | POA: Insufficient documentation

## 2015-05-07 DIAGNOSIS — F1010 Alcohol abuse, uncomplicated: Secondary | ICD-10-CM | POA: Insufficient documentation

## 2015-05-07 DIAGNOSIS — F17210 Nicotine dependence, cigarettes, uncomplicated: Secondary | ICD-10-CM | POA: Insufficient documentation

## 2015-05-07 LAB — CBC WITH DIFFERENTIAL/PLATELET
BASOS PCT: 1 % (ref 0–1)
Basophils Absolute: 0.1 10*3/uL (ref 0.0–0.1)
EOS ABS: 0.2 10*3/uL (ref 0.0–0.7)
EOS PCT: 2 % (ref 0–5)
HCT: 39.6 % (ref 39.0–52.0)
HEMOGLOBIN: 14 g/dL (ref 13.0–17.0)
LYMPHS ABS: 1.9 10*3/uL (ref 0.7–4.0)
LYMPHS PCT: 22 % (ref 12–46)
MCH: 34.8 pg — AB (ref 26.0–34.0)
MCHC: 35.4 g/dL (ref 30.0–36.0)
MCV: 98.5 fL (ref 78.0–100.0)
MPV: 9.3 fL (ref 8.6–12.4)
Monocytes Absolute: 1 10*3/uL (ref 0.1–1.0)
Monocytes Relative: 12 % (ref 3–12)
NEUTROS PCT: 63 % (ref 43–77)
Neutro Abs: 5.4 10*3/uL (ref 1.7–7.7)
PLATELETS: 157 10*3/uL (ref 150–400)
RBC: 4.02 MIL/uL — AB (ref 4.22–5.81)
RDW: 13.6 % (ref 11.5–15.5)
WBC: 8.5 10*3/uL (ref 4.0–10.5)

## 2015-05-07 LAB — COMPLETE METABOLIC PANEL WITH GFR
ALBUMIN: 2.1 g/dL — AB (ref 3.6–5.1)
ALT: 13 U/L (ref 9–46)
AST: 24 U/L (ref 10–35)
Alkaline Phosphatase: 84 U/L (ref 40–115)
BUN: 8 mg/dL (ref 7–25)
CHLORIDE: 97 mmol/L — AB (ref 98–110)
CO2: 24 mmol/L (ref 20–31)
CREATININE: 0.66 mg/dL — AB (ref 0.70–1.33)
Calcium: 8.2 mg/dL — ABNORMAL LOW (ref 8.6–10.3)
Glucose, Bld: 100 mg/dL — ABNORMAL HIGH (ref 65–99)
Potassium: 4 mmol/L (ref 3.5–5.3)
Sodium: 132 mmol/L — ABNORMAL LOW (ref 135–146)
TOTAL PROTEIN: 5.9 g/dL — AB (ref 6.1–8.1)
Total Bilirubin: 1.5 mg/dL — ABNORMAL HIGH (ref 0.2–1.2)

## 2015-05-07 LAB — MAGNESIUM: MAGNESIUM: 1.6 mg/dL (ref 1.5–2.5)

## 2015-05-07 LAB — AMMONIA: AMMONIA: 35 umol/L (ref 16–53)

## 2015-05-07 MED ORDER — PANTOPRAZOLE SODIUM 40 MG PO TBEC: 40.0000 mg | DELAYED_RELEASE_TABLET | Freq: Every day | ORAL | Status: DC

## 2015-05-07 MED ORDER — FUROSEMIDE 40 MG PO TABS: 40.0000 mg | ORAL_TABLET | Freq: Every day | ORAL | Status: DC

## 2015-05-07 MED ORDER — ALBUTEROL SULFATE HFA 108 (90 BASE) MCG/ACT IN AERS: 2.0000 | INHALATION_SPRAY | Freq: Four times a day (QID) | RESPIRATORY_TRACT | Status: DC | PRN

## 2015-05-07 MED ORDER — POTASSIUM CHLORIDE CRYS ER 20 MEQ PO TBCR: 20.0000 meq | EXTENDED_RELEASE_TABLET | Freq: Every day | ORAL | Status: DC

## 2015-05-07 MED ORDER — SPIRONOLACTONE 50 MG PO TABS: 50.0000 mg | ORAL_TABLET | Freq: Every day | ORAL | Status: DC

## 2015-05-07 MED ORDER — TRAMADOL HCL 50 MG PO TABS: 50.0000 mg | ORAL_TABLET | Freq: Three times a day (TID) | ORAL | Status: DC | PRN

## 2015-05-07 NOTE — Patient Instructions (Signed)
Cirrhosis °Cirrhosis is long-term (chronic) liver injury. The liver is your largest internal organ, and it performs many functions. The liver converts food into energy, removes toxic material from your blood, makes important proteins, and absorbs necessary vitamins from your diet. °If you have cirrhosis, it means many of your healthy liver cells have been replaced by scar tissue. This prevents blood from flowing through your liver, which makes it difficult for your liver to function. This scarring is not reversible, but treatment can prevent it from getting worse.  °CAUSES  °Hepatitis C and long-term alcohol abuse are the most common causes of cirrhosis. Other causes include: °· Nonalcoholic fatty liver disease. °· Hepatitis B infection. °· Autoimmune hepatitis. °· Diseases that cause blockage of ducts inside the liver. °· Inherited liver diseases. °· Reactions to certain long-term medicines. °· Parasitic infections. °· Long-term exposure to certain toxins. °RISK FACTORS °You may have a higher risk of cirrhosis if you: °· Have certain hepatitis viruses. °· Abuse alcohol, especially if you are male. °· Are overweight. °· Share needles. °· Have unprotected sex with someone who has hepatitis. °SYMPTOMS  °You may not have any signs and symptoms at first. Symptoms may not develop until the damage to your liver starts to get worse. Signs and symptoms of cirrhosis may include:  °· Tenderness in the right-upper part of your abdomen. °· Weakness and tiredness (fatigue). °· Loss of appetite. °· Nausea. °· Weight loss and muscle loss. °· Itchiness. °· Yellow skin and eyes (jaundice). °· Buildup of fluid in the abdomen (ascites). °· Swelling of the feet and ankles (edema). °· Appearance of tiny blood vessels under the skin. °· Mental confusion. °· Easy bruising and bleeding. °DIAGNOSIS  °Your health care provider may suspect cirrhosis based on your symptoms and medical history, especially if you have other medical conditions  or a history of alcohol abuse. Your health care provider will do a physical exam to feel your liver and check for signs of cirrhosis. Your health care provider may perform other tests, including:  °· Blood tests to check:   °¨ Whether you have hepatitis B or C.   °¨ Kidney function. °¨ Liver function. °· Imaging tests such as: °¨ MRI or CT scan to look for changes seen in advanced cirrhosis. °¨ Ultrasound to see if normal liver tissue is being replaced by scar tissue. °· A procedure using a long needle to take a sample of liver tissue (biopsy) for examination under a microscope. Liver biopsy can confirm the diagnosis of cirrhosis.   °TREATMENT  °Treatment depends on how damaged your liver is and what caused the damage. Treatment may include treating cirrhosis symptoms or treating the underlying causes of the condition to try to slow the progression of the damage. Treatment may include: °· Making lifestyle changes, such as:   °¨ Eating a healthy diet. °¨ Restricting salt intake.  °¨ Maintaining a healthy weight.   °¨ Not abusing drugs or alcohol. °· Taking medicines to: °¨ Treat liver infections or other infections. °¨ Control itching. °¨ Reduce fluid buildup. °¨ Reduce certain blood toxins. °¨ Reduce risk of bleeding from enlarged blood vessels in the stomach or esophagus (varices). °· If varices are causing bleeding problems, you may need treatment with a procedure that ties up the vessels causing them to fall off (band ligation). °· If cirrhosis is causing your liver to fail, your health care provider may recommend a liver transplant. °· Other treatments may be recommended depending on any complications of cirrhosis, such as liver-related kidney failure (hepatorenal   syndrome). °HOME CARE INSTRUCTIONS  °· Take medicines only as directed by your health care provider. Do not use drugs that are toxic to your liver. Ask your health care provider before taking any new medicines, including over-the-counter medicines.    °· Rest as needed. °· Eat a well-balanced diet. Ask your health care provider or dietitian for more information.   °· You may have to follow a low-salt diet or restrict your water intake as directed. °· Do not drink alcohol. This is especially important if you are taking acetaminophen. °· Keep all follow-up visits as directed by your health care provider. This is important. °SEEK MEDICAL CARE IF: °· You have fatigue or weakness that is getting worse. °· You develop swelling of the hands, feet, legs, or face. °· You have a fever. °· You develop loss of appetite. °· You have nausea or vomiting. °· You develop jaundice. °· You develop easy bruising or bleeding. °SEEK IMMEDIATE MEDICAL CARE IF: °· You vomit bright red blood or a material that looks like coffee grounds. °· You have blood in your stools. °· Your stools appear black and tarry. °· You become confused. °· You have chest pain or trouble breathing. °  °This information is not intended to replace advice given to you by your health care provider. Make sure you discuss any questions you have with your health care provider. °  °Document Released: 05/15/2005 Document Revised: 06/05/2014 Document Reviewed: 01/21/2014 °Elsevier Interactive Patient Education ©2016 [employer] ° °

## 2015-05-07 NOTE — Progress Notes (Signed)
Patient here for f/up cirrhosis. Patient reports swelling in abdomen and legs since October.   Patient reports pain rated 7/10. Describes pain constant sharp, stabbing pain, with cramping in legs and hands.  Patient requesting refill on medication.

## 2015-05-07 NOTE — Progress Notes (Signed)
CC: Follow-up on liver cirrhosis.  HPI: [name] is a 54 y.o. male  with a history of liver cirrhosis and ascites, tobacco abuse who comes into the clinic for follow-up visit. Medical history is notable for multiple ultrasound-guided abdominal paracentesis yielding a total of 22.5 L in the last 6 weeks.  He complains of abdominal discomfort and swelling which has worsened since her last visit; he was seen at the ED 2 days ago with 500 mL of fluid was removed and sent for paracentesis but he complains of worsening shortness of breath and worsening pedal edema  He is yet to see GI due to the fact that he has no medical coverage and is afraid to apply for the [employer] discount  No Known Allergies Past Medical History  Diagnosis Date  . ETOH abuse   . Cirrhosis of liver [facility])    Current Outpatient Prescriptions on File Prior to Visit  Medication Sig Dispense Refill  . albuterol (PROVENTIL HFA;VENTOLIN HFA) 108 (90 BASE) MCG/ACT inhaler Inhale 2 puffs into the lungs every 6 (six) hours as needed for wheezing or shortness of breath.     Marland Kitchen amoxicillin-clavulanate (AUGMENTIN) 875-125 MG tablet Take 1 tablet by mouth every 12 (twelve) hours. 6 tablet 0  . folic acid (FOLVITE) 1 MG tablet Take 1 tablet (1 mg total) by mouth daily. 30 tablet 0  . furosemide (LASIX) 40 MG tablet Take 1 tablet (40 mg total) by mouth daily. 14 tablet 0  . lactulose (CHRONULAC) 10 GM/15ML solution Take 15 mLs (10 g total) by mouth 2 (two) times daily. 946 mL 2  . pantoprazole (PROTONIX) 40 MG tablet Take 1 tablet (40 mg total) by mouth daily. 30 tablet 0  . potassium chloride SA (K-DUR,KLOR-CON) 20 MEQ tablet Take 1 tablet (20 mEq total) by mouth daily. 14 tablet 0  . spironolactone (ALDACTONE) 50 MG tablet Take 1 tablet (50 mg total) by mouth daily. 30 tablet 0  . thiamine 100 MG tablet Take 1 tablet (100 mg total) by mouth daily. 30 tablet 0   No current facility-administered medications on file prior to  visit.   Family History  Problem Relation Age of Onset  . Diabetes Mother    Social History   Social History  . Marital Status: Single    Spouse Name: N/A  . Number of Children: N/A  . Years of Education: N/A   Occupational History  . Not on file.   Social History Main Topics  . Smoking status: Current Every Day Smoker -- 1.00 packs/day for 40 years    Types: Cigarettes  . Smokeless tobacco: Never Used  . Alcohol Use: 63.0 oz/week    105 Cans of beer per week     Comment: 04/28/15 states no drink in 3 weeks  . Drug Use: No  . Sexual Activity: No   Other Topics Concern  . Not on file   Social History Narrative    Review of Systems: Constitutional: Negative for fever, chills, diaphoresis, activity change, appetite change and fatigue. HENT: Negative for ear pain, nosebleeds, congestion, facial swelling, rhinorrhea, neck pain, neck stiffness and ear discharge.  Eyes: Negative for pain, discharge, redness, itching and visual disturbance. Respiratory: Negative for cough, choking, chest tightness, shortness of breath, wheezing and stridor.  Cardiovascular: Negative for chest pain, palpitations and positive for leg swelling. Gastrointestinal: Positive for abdominal distention. Genitourinary: Negative for dysuria, urgency, frequency, hematuria, flank pain, decreased urine volume, difficulty urinating and dyspareunia.  Musculoskeletal: Negative for  back pain, positive for leg swelling and leg cramps Neurological: Negative for dizziness, tremors, seizures, syncope, facial asymmetry, speech difficulty, weakness, light-headedness, numbness and headaches.  Hematological: Negative for adenopathy. Does not bruise/bleed easily. Psychiatric/Behavioral: Negative for hallucinations, behavioral problems, confusion, dysphoric mood, decreased concentration and agitation.     Objective:   Filed Vitals:   05/07/15 1044  BP: 109/77  Pulse: 89  Temp: 97.4 F (36.3 C)  Resp: 16     Physical Exam: Constitutional: Patient appears, chronically ill looking HENT: Normocephalic, atraumatic, External right and left ear normal. Oropharynx is clear and moist.  Eyes: mild scleral icterus. Neck: Normal ROM. Neck supple. No JVD. No tracheal deviation. No thyromegaly. CVS: RRR, S1/S2 +, no murmurs, no gallops, no carotid bruit.  Pulmonary: Effort and breath sounds normal, no stridor, rhonchi, wheezes, rales.  Abdominal: Massive ascites, abdominal girth is 44 inches  Musculoskeletal: 3+ bilateral pedal edema up to the thighs Lymphadenopathy: No lymphadenopathy noted, cervical, inguinal or axillary Neuro: Alert. Normal reflexes, muscle tone coordination. No cranial nerve deficit. Skin: Skin is warm and dry. No rash noted. Not diaphoretic. No erythema. No pallor. Psychiatric: Normal mood and affect. Behavior, judgment, thought content normal.     Lab Results  Component Value Date   WBC 10.3 05/05/2015   HGB 14.1 05/05/2015   HCT 41.5 05/05/2015   MCV 100.5* 05/05/2015   PLT 168 05/05/2015   Lab Results  Component Value Date   CREATININE 0.73 05/05/2015   BUN 9 05/05/2015   NA 130* 05/05/2015   K 3.8 05/05/2015   CL 99* 05/05/2015   CO2 25 05/05/2015       Assessment and plan:  Liver cirrhosis with hepatitis: MELD score = 7 Continue lactulose Will send off labs-complete metabolic panel, CBC, ammonia level, magnesium. Will also call radiology department to schedule an ultrasound-guided paracentesis. I have emphasized the need to [facility] card so we can refer him to GI for optimization of management.  Pedal edema: Secondary to liver cirrhosis  Hypokalemia: Continue potassium supplements. Labs sent off  Alcohol abuse: The patient says he has quit alcohol.  Tobacco abuse: Spent 3 minutes counseling on cessation and he is not ready to quit yet.     Jaclyn ShaggyEnobong, [name], MD. [facility] [phone] 05/07/2015, 10:58 AM

## 2015-05-10 ENCOUNTER — Telehealth: Payer: Self-pay | Admitting: Clinical

## 2015-05-10 NOTE — Telephone Encounter (Signed)
Attempt to give pt best contact numbers provided by local [facility] [phone] for [facility] [address] location/ [phone] for [address] location), for pt to arrange possible transportation to [employer] meetings. No voicemail, no message left.

## 2015-05-11 ENCOUNTER — Emergency Department (HOSPITAL_COMMUNITY)
Admission: EM | Admit: 2015-05-11 | Discharge: 2015-05-12 | Disposition: A | Discharge status: Home / Self Care | Payer: Self-pay | Attending: Emergency Medicine | Admitting: Emergency Medicine

## 2015-05-11 ENCOUNTER — Telehealth: Payer: Self-pay | Admitting: *Deleted

## 2015-05-11 DIAGNOSIS — F17210 Nicotine dependence, cigarettes, uncomplicated: Secondary | ICD-10-CM | POA: Insufficient documentation

## 2015-05-11 DIAGNOSIS — K7460 Unspecified cirrhosis of liver: Secondary | ICD-10-CM

## 2015-05-11 DIAGNOSIS — K7469 Other cirrhosis of liver: Secondary | ICD-10-CM | POA: Insufficient documentation

## 2015-05-11 DIAGNOSIS — K7290 Hepatic failure, unspecified without coma: Secondary | ICD-10-CM

## 2015-05-11 DIAGNOSIS — R1084 Generalized abdominal pain: Secondary | ICD-10-CM

## 2015-05-11 DIAGNOSIS — Z79899 Other long term (current) drug therapy: Secondary | ICD-10-CM | POA: Insufficient documentation

## 2015-05-11 LAB — CULTURE, BODY FLUID-BOTTLE

## 2015-05-11 LAB — CULTURE, BODY FLUID W GRAM STAIN -BOTTLE: Culture: NO GROWTH

## 2015-05-11 NOTE — ED Notes (Signed)
Patient arrived via [facility]. EMS reports: patient had fluid drained from abdomen 3 weeks ago. Patient has cirrhosis. Patient c/o severe bilateral lower extremity pain, and severely distended abdomen. BP 127/86, Pulse 88, Resp 18, 98A% on room air. Wheezing noted. Hx - COPD, cirrhosis.

## 2015-05-11 NOTE — Telephone Encounter (Signed)
-----   Message from [name], MD sent at 05/10/2015  8:32 AM EST ----- Electrolytes are mildly deranged due to his cirrhosis

## 2015-05-11 NOTE — Telephone Encounter (Signed)
Verified name and date of birth and explained to patient that his electrolytes were mildly out of range due to his cirrhosis.  Patient verbalized understanding.

## 2015-05-12 ENCOUNTER — Encounter (HOSPITAL_COMMUNITY): Payer: Self-pay | Admitting: Emergency Medicine

## 2015-05-12 LAB — COMPREHENSIVE METABOLIC PANEL
ALT: 14 U/L — ABNORMAL LOW (ref 17–63)
AST: 23 U/L (ref 15–41)
Albumin: 1.9 g/dL — ABNORMAL LOW (ref 3.5–5.0)
Alkaline Phosphatase: 82 U/L (ref 38–126)
Anion gap: 6 (ref 5–15)
BUN: 6 mg/dL (ref 6–20)
CO2: 25 mmol/L (ref 22–32)
Calcium: 8.2 mg/dL — ABNORMAL LOW (ref 8.9–10.3)
Chloride: 100 mmol/L — ABNORMAL LOW (ref 101–111)
Creatinine, Ser: 0.98 mg/dL (ref 0.61–1.24)
GFR calc Af Amer: >60 mL/min (ref >60)
GFR calc non Af Amer: >60 mL/min (ref >60)
Glucose, Bld: 108 mg/dL — ABNORMAL HIGH (ref 65–99)
Potassium: 3.7 mmol/L (ref 3.5–5.1)
Sodium: 131 mmol/L — ABNORMAL LOW (ref 135–145)
Total Bilirubin: 0.9 mg/dL (ref 0.3–1.2)
Total Protein: 5.5 g/dL — ABNORMAL LOW (ref 6.5–8.1)

## 2015-05-12 LAB — ALBUMIN, FLUID (OTHER): Albumin, Fluid: <1 g/dL

## 2015-05-12 LAB — URINALYSIS, ROUTINE W REFLEX MICROSCOPIC
Bilirubin Urine: NEGATIVE
Glucose, UA: NEGATIVE mg/dL
Ketones, ur: NEGATIVE mg/dL
Nitrite: NEGATIVE
Protein, ur: NEGATIVE mg/dL
Specific Gravity, Urine: 1.018 (ref 1.005–1.030)
pH: 5.5 (ref 5.0–8.0)

## 2015-05-12 LAB — BODY FLUID CELL COUNT WITH DIFFERENTIAL
Lymphs, Fluid: 33 %
Monocyte-Macrophage-Serous Fluid: 43 % — ABNORMAL LOW (ref 50–90)
Neutrophil Count, Fluid: 24 % (ref 0–25)
Total Nucleated Cell Count, Fluid: 77 cu mm (ref 0–1000)

## 2015-05-12 LAB — URINE MICROSCOPIC-ADD ON

## 2015-05-12 LAB — LACTATE DEHYDROGENASE, PLEURAL OR PERITONEAL FLUID: LD, Fluid: 23 U/L (ref 3–23)

## 2015-05-12 LAB — GLUCOSE, PERITONEAL FLUID: Glucose, Peritoneal Fluid: 120 mg/dL

## 2015-05-12 LAB — CBC WITH DIFFERENTIAL/PLATELET
Basophils Absolute: 0.1 10*3/uL (ref 0.0–0.1)
Basophils Relative: 1 %
Eosinophils Absolute: 0.3 10*3/uL (ref 0.0–0.7)
Eosinophils Relative: 4 %
HCT: 35.8 % — ABNORMAL LOW (ref 39.0–52.0)
Hemoglobin: 12.1 g/dL — ABNORMAL LOW (ref 13.0–17.0)
Lymphocytes Relative: 27 %
Lymphs Abs: 1.9 10*3/uL (ref 0.7–4.0)
MCH: 34.2 pg — ABNORMAL HIGH (ref 26.0–34.0)
MCHC: 33.8 g/dL (ref 30.0–36.0)
MCV: 101.1 fL — ABNORMAL HIGH (ref 78.0–100.0)
Monocytes Absolute: 1.1 10*3/uL — ABNORMAL HIGH (ref 0.1–1.0)
Monocytes Relative: 15 %
Neutro Abs: 3.7 10*3/uL (ref 1.7–7.7)
Neutrophils Relative %: 53 %
Platelets: 174 10*3/uL (ref 150–400)
RBC: 3.54 MIL/uL — ABNORMAL LOW (ref 4.22–5.81)
RDW: 13.3 % (ref 11.5–15.5)
WBC: 7 10*3/uL (ref 4.0–10.5)

## 2015-05-12 LAB — PROTEIN, BODY FLUID: Total protein, fluid: <3 g/dL

## 2015-05-12 LAB — LIPASE, BLOOD: Lipase: 43 U/L (ref 11–51)

## 2015-05-12 MED ORDER — ALBUMIN HUMAN 25 % IV SOLN
50.0000 g | Freq: Once | INTRAVENOUS | Status: AC
  Administered 2015-05-12: 50 g via INTRAVENOUS
  Filled 2015-05-12: qty 200

## 2015-05-12 NOTE — ED Notes (Signed)
Unable to obtain IV start x2. Chelsea RN to attempt

## 2015-05-12 NOTE — Discharge Instructions (Signed)
Abdominal Pain, Adult Many things can cause abdominal pain. Usually, abdominal pain is not caused by a disease and will improve without treatment. It can often be observed and treated at home. Your health care provider will do a physical exam and possibly order blood tests and X-rays to help determine the seriousness of your pain. However, in many cases, more time must pass before a clear cause of the pain can be found. Before that point, your health care provider may not know if you need more testing or further treatment. HOME CARE INSTRUCTIONS Monitor your abdominal pain for any changes. The following actions may help to alleviate any discomfort you are experiencing:  Only take over-the-counter or prescription medicines as directed by your health care provider.  Do not take laxatives unless directed to do so by your health care provider.  Try a clear liquid diet (broth, tea, or water) as directed by your health care provider. Slowly move to a bland diet as tolerated. SEEK MEDICAL CARE IF:  You have unexplained abdominal pain.  You have abdominal pain associated with nausea or diarrhea.  You have pain when you urinate or have a bowel movement.  You experience abdominal pain that wakes you in the night.  You have abdominal pain that is worsened or improved by eating food.  You have abdominal pain that is worsened with eating fatty foods.  You have a fever. SEEK IMMEDIATE MEDICAL CARE IF:  Your pain does not go away within 2 hours.  You keep throwing up (vomiting).  Your pain is felt only in portions of the abdomen, such as the right side or the left lower portion of the abdomen.  You pass bloody or black tarry stools. MAKE SURE YOU:  Understand these instructions.  Will watch your condition.  Will get help right away if you are not doing well or get worse.   This information is not intended to replace advice given to you by your health care provider. Make sure you discuss  any questions you have with your health care provider.   Document Released: 02/22/2005 Document Revised: 02/03/2015 Document Reviewed: 01/22/2013 Elsevier Interactive Patient Education 2016 Elsevier Inc.  Liver Failure Liver failure is a condition in which the liver loses its ability to function due to injury or disease. The liver is a large organ in the upper right-hand side of the abdomen. It is involved in many important functions, including storing energy, producing fluids that the body needs, and removing harmful substances from the bloodstream. Liver failure can develop quickly, over days or weeks (acute liver failure). It can also develop gradually, over months or years (chronicliver failure).  CAUSES There are many possible causes of liver failure, including:  Liver infection (viral hepatitis).  Alcohol abuse.  An overdose of certain medicines. Acetaminophen overdose is a common cause.  Liver disease.  Ingesting poison from mushrooms or mold. SYMPTOMS  Symptoms of this condition may include:  Yellowing of the skin and the whites of the eyes (jaundice).  Bruising easily.  Persistent bleeding.  Itchy skin (pruritus).  Red, spider-like lines on the skin.  Loss of appetite.  Nausea.  Vomiting blood.  Bloody bowel movements.  Weight loss.  Muscle loss.  Jerky or floppy muscle movements, especially with hand movements (asterixis).  Fluid buildup in the belly (ascites).  Decreased urination. This may be a sign that your kidneys have stopped working (renal failure).  Confusion and sleepiness.  Difficulty sleeping.  Mood and personality changes.  Frequent infections.  Breath that smells sweet or musty.  Difficulty breathing. DIAGNOSIS  This condition is diagnosed based on your symptoms, your medical history, and a physical exam. You may have tests, including:  Blood tests.  CT scan.  MRI.  Ultrasound.  Removal of a small amount of liver tissue  to be examined under a microscope (biopsy). TREATMENT  Treatment depends on the cause and severity of your condition. Treatment for chronic liver failure may include:  Medicines to help reduce symptoms.  Lifestyle changes, such as limiting salt and animal proteins in your diet. Foods that contain animal proteins include red meat, fish, and dairy products. Acute or advanced (end stage) liver failure may require hospitalization. Treatment may include:  Antibiotic medicine.  IV fluids that contain sugar (glucose) and minerals (electrolytes).  Flushing out toxic substances from the body using medicine (lactulose) or a cleansing procedure (enema).  Adding certain amounts of the liquid part of blood (plasma) to your bloodstream (receiving a transfusion).  Using an artificial kidney to filter your blood (hemodialysis) if you have renal failure.  Breathing support and a breathing tube (respirator).  Liver transplant. This is a surgery to replace your liver with another person's liver (donor liver). This may be the best option if your liver has completely stopped functioning. HOME CARE INSTRUCTIONS  Take over-the-counter and prescription medicines only as told by your health care provider.  Do not drink alcohol.  Do not use tobacco products, including cigarettes, chewing tobacco, or e-cigarettes. If you need help quitting, ask your health care provider.  Follow instructions from your health care provider about eating and drinking restrictions. This may include:  Limiting the amount of animal protein that you eat.  Increasing the amount of plant-based protein that you eat. Foods that contain plant-based proteins include whole grains, nuts, and vegetables.  Taking vitamin supplements.  Limiting the amount of salt that you eat.  Follow instructions from your health care provider about maintaining your vaccinations, especially vaccinations against hepatitis A and B.  Exercise regularly,  as told by your health care provider.  Keep all follow-up visits as told by your health care provider. This is important. SEEK MEDICAL CARE IF:  You have symptoms that get worse.  You lose a lot of weight without trying.  You have a fever or chills. SEEK IMMEDIATE MEDICAL CARE IF:  You become confused or very sleepy.  You cannot take care of yourself or be taken care of at home.  You are not urinating.  You have difficulty breathing.  You vomit blood.   This information is not intended to replace advice given to you by your health care provider. Make sure you discuss any questions you have with your health care provider.   Document Released: 02/03/2015 Document Reviewed: 06/03/2014 Elsevier Interactive Patient Education [employer]

## 2015-05-12 NOTE — ED Provider Notes (Signed)
CSN: [id number]     Arrival date & time 05/11/15  2342 History  By signing my name below, I, Phillis HaggisGabriella Gaje, attest that this documentation has been prepared under the direction and in the presence of Raeford RazorStephen Bristyl Mclees, MD. Electronically Signed: Phillis HaggisGabriella Gaje, ED Scribe. 05/12/2015. 12:11 AM.   Chief Complaint  Patient presents with  . Leg Swelling   The history is provided by the patient. No language interpreter was used.  HPI Comments: Bryce MawRobert [name] is a 54 y.o. male with a hx of cirrhosis of the liver and ETOH abuse [facility] who presents to the Emergency Department complaining of leg swelling. Progressively worsening since last paracentesis ~2 weeks ago. Pt states that he is having severe pain and distention in his abdomen, right worse than left, intermittent SOB, and severe bilateral lower leg pain. He denies fever or chills. Reports scheduled for paracentesis Friday but feels like he cannot wait this long.   Past Medical History  Diagnosis Date  . ETOH abuse   . Cirrhosis of liver [facility](HCC)    Past Surgical History  Procedure Laterality Date  . No past surgeries      jaw surgery 633yrs ago   Family History  Problem Relation Age of Onset  . Diabetes Mother    Social History  Substance Use Topics  . Smoking status: Current Every Day Smoker -- 1.00 packs/day for 40 years    Types: Cigarettes  . Smokeless tobacco: Never Used  . Alcohol Use: 63.0 oz/week    105 Cans of beer per week     Comment: 04/28/15 states no drink in 3 weeks    Review of Systems  Constitutional: Negative for fever and chills.  Respiratory: Negative for shortness of breath.   Cardiovascular: Positive for leg swelling.  Gastrointestinal: Positive for abdominal pain and abdominal distention.  Musculoskeletal: Positive for arthralgias.  All other systems reviewed and are negative.  Allergies  Review of patient's allergies indicates no known allergies.  Home Medications   Prior to Admission  medications   Medication Sig Start Date End Date Taking? Authorizing Provider  albuterol (PROVENTIL HFA;VENTOLIN HFA) 108 (90 BASE) MCG/ACT inhaler Inhale 2 puffs into the lungs every 6 (six) hours as needed for wheezing or shortness of breath. 05/07/15   [name], MD  folic acid (FOLVITE) 1 MG tablet Take 1 tablet (1 mg total) by mouth daily. 04/15/15   Elease EtienneAnand D Hongalgi, MD  furosemide (LASIX) 40 MG tablet Take 1 tablet (40 mg total) by mouth daily. 05/07/15   [name], MD  lactulose (CHRONULAC) 10 GM/15ML solution Take 15 mLs (10 g total) by mouth 2 (two) times daily. 04/25/15   Dorothea OgleIskra M Myers, MD  pantoprazole (PROTONIX) 40 MG tablet Take 1 tablet (40 mg total) by mouth daily. 05/07/15   [name], MD  potassium chloride SA (K-DUR,KLOR-CON) 20 MEQ tablet Take 1 tablet (20 mEq total) by mouth daily. 05/07/15   [name], MD  spironolactone (ALDACTONE) 50 MG tablet Take 1 tablet (50 mg total) by mouth daily. 05/07/15   [name], MD  thiamine 100 MG tablet Take 1 tablet (100 mg total) by mouth daily. 04/15/15   Elease EtienneAnand D Hongalgi, MD  traMADol (ULTRAM) 50 MG tablet Take 1 tablet (50 mg total) by mouth every 8 (eight) hours as needed. 05/07/15   [name], MD   BP 119/81 mmHg  Pulse 98  Temp(Src) 97 F (36.1 C) (Oral)  Resp 16  SpO2 99% Physical Exam  Constitutional: He is oriented to person, place, and time. He appears well-developed and well-nourished.  HENT:  Head: Normocephalic and atraumatic.  Eyes: EOM are normal.  Neck: Normal range of motion.  Cardiovascular: Normal rate, regular rhythm, normal heart sounds and intact distal pulses.   Pulmonary/Chest: Effort normal and breath sounds normal. No respiratory distress.  Abdominal: Soft. He exhibits distension. There is no tenderness.  Tense and markedly distended abdomen. Diffuse tenderness. No guarding.   Musculoskeletal: Normal range of motion. He exhibits edema.  Severe, symmetric, pitting lower extremity edema   Neurological: He is alert and oriented to person, place, and time.  Skin: Skin is warm and dry.  Psychiatric: He has a normal mood and affect. Judgment normal.  Nursing note and vitals reviewed.   ED Course  .Paracentesis Date/Time: 05/12/2015 7:30 AM Performed by: Raeford Razor Authorized by: Raeford Razor Consent: Verbal consent obtained. Risks and benefits: risks, benefits and alternatives were discussed Consent given by: patient Required items: required blood products, implants, devices, and special equipment available Patient identity confirmed: verbally with patient and provided demographic data Initial or subsequent exam: subsequent Procedure purpose: therapeutic and diagnostic Indications: abdominal discomfort secondary to ascites Anesthesia: local infiltration Local anesthetic: lidocaine 1% without epinephrine Anesthetic total: 4 ml Patient sedated: no Preparation: Patient was prepped and draped in the usual sterile fashion. Puncture site: right lower quadrant Dressing: 4x4 sterile gauze Patient tolerance: Patient tolerated the procedure well with no immediate complications Comments: Bedside US with large amount of fluid through out. ~9cm deep pocket in RLQ. Removed 10.6L without    (including critical care time)   DIAGNOSTIC STUDIES: Oxygen Saturation is 99% on RA, normal by my interpretation.    COORDINATION OF CARE: 12:09 AM-Discussed treatment plan which includes removal of fluid from the abdomen with pt at bedside and pt agreed to plan.   Labs Review Labs Reviewed  COMPREHENSIVE METABOLIC PANEL - Abnormal; Notable for the following:    Sodium 131 (*)    Chloride 100 (*)    Glucose, Bld 108 (*)    Calcium 8.2 (*)    Total Protein 5.5 (*)    Albumin 1.9 (*)    ALT 14 (*)    All other components within normal limits  CBC WITH DIFFERENTIAL/PLATELET - Abnormal; Notable for the following:    RBC 3.54 (*)    Hemoglobin 12.1 (*)    HCT 35.8 (*)    MCV  101.1 (*)    MCH 34.2 (*)    Monocytes Absolute 1.1 (*)    All other components within normal limits  URINALYSIS, ROUTINE W REFLEX MICROSCOPIC (NOT AT [facility]) - Abnormal; Notable for the following:    Color, Urine AMBER (*)    APPearance TURBID (*)    Hgb urine dipstick SMALL (*)    Leukocytes, UA SMALL (*)    All other components within normal limits  BODY FLUID CELL COUNT WITH DIFFERENTIAL - Abnormal; Notable for the following:    Appearance, Fluid HAZY (*)    Monocyte-Macrophage-Serous Fluid 43 (*)    All other components within normal limits  URINE MICROSCOPIC-ADD ON - Abnormal; Notable for the following:    Squamous Epithelial / LPF 0-5 (*)    Bacteria, UA RARE (*)    Crystals CA OXALATE CRYSTALS (*)    All other components within normal limits  BODY FLUID CULTURE  LACTATE DEHYDROGENASE, BODY FLUID  GLUCOSE, PERITONEAL FLUID  PROTEIN, BODY FLUID  ALBUMIN, FLUID  LIPASE, BLOOD    Imaging  Review No results found. I have personally reviewed and evaluated these images and lab results as part of my medical decision-making.   EKG Interpretation None      MDM   Final diagnoses:  Decompensated hepatic cirrhosis (HCC)  Generalized abdominal pain    54yM with abdominal pain/distension. Seemed very uncomfortable, but not toxic. Paracentesis scheduled for Friday but, given degree of discomfort he is in and lack of transportation, did it in the ED today. Feels significantly better. Repeat exam w/o significant tenderness. Doubt SBP, but labs pending. Will give albumin with large volume removal. Anticipate discharge after albumin assuming labs not consistent with SBP.   I personally preformed the services scribed in my presence. The recorded information has been reviewed is accurate. Raeford Razor, MD.    Raeford Razor, MD 05/18/15 [phone]

## 2015-05-12 NOTE — ED Provider Notes (Signed)
Pt here s/p therapeutic paracentesis. Fluid analysis is not consistent with SBP. Patient feeling improved. Plan to DC home with outpatient follow-up.  Tilden FossaElizabeth Meilani Edmundson, MD 05/12/15 [id number]

## 2015-05-13 LAB — PATHOLOGIST SMEAR REVIEW

## 2015-05-14 ENCOUNTER — Ambulatory Visit (HOSPITAL_COMMUNITY)
Admission: RE | Admit: 2015-05-14 | Discharge: 2015-05-14 | Disposition: A | Discharge status: Home / Self Care | Payer: Self-pay | Source: Ambulatory Visit | Attending: Family Medicine | Admitting: Family Medicine

## 2015-05-14 DIAGNOSIS — K7031 Alcoholic cirrhosis of liver with ascites: Secondary | ICD-10-CM

## 2015-05-14 DIAGNOSIS — R188 Other ascites: Secondary | ICD-10-CM | POA: Insufficient documentation

## 2015-05-14 MED ORDER — LIDOCAINE HCL (PF) 1 % IJ SOLN
INTRAMUSCULAR | Status: AC
  Filled 2015-05-14: qty 10

## 2015-05-14 NOTE — Procedures (Signed)
   US guided LLQ paracentesis  4 liter max per MD ( just had "10 liters" removed in ED 12/14)  Tolerated well 4 liters yellow fluid removed

## 2015-05-15 LAB — BODY FLUID CULTURE: Culture: NO GROWTH

## 2015-05-24 ENCOUNTER — Emergency Department (HOSPITAL_COMMUNITY)
Admission: EM | Admit: 2015-05-24 | Discharge: 2015-05-25 | Disposition: A | Discharge status: Home / Self Care | Payer: Self-pay | Attending: Emergency Medicine | Admitting: Emergency Medicine

## 2015-05-24 ENCOUNTER — Encounter (HOSPITAL_COMMUNITY): Payer: Self-pay

## 2015-05-24 DIAGNOSIS — F17210 Nicotine dependence, cigarettes, uncomplicated: Secondary | ICD-10-CM | POA: Insufficient documentation

## 2015-05-24 DIAGNOSIS — Z8719 Personal history of other diseases of the digestive system: Secondary | ICD-10-CM | POA: Insufficient documentation

## 2015-05-24 DIAGNOSIS — Z79899 Other long term (current) drug therapy: Secondary | ICD-10-CM | POA: Insufficient documentation

## 2015-05-24 DIAGNOSIS — R000 Tachycardia, unspecified: Secondary | ICD-10-CM | POA: Insufficient documentation

## 2015-05-24 DIAGNOSIS — R188 Other ascites: Secondary | ICD-10-CM | POA: Insufficient documentation

## 2015-05-24 DIAGNOSIS — R140 Abdominal distension (gaseous): Secondary | ICD-10-CM | POA: Insufficient documentation

## 2015-05-24 DIAGNOSIS — J441 Chronic obstructive pulmonary disease with (acute) exacerbation: Secondary | ICD-10-CM | POA: Insufficient documentation

## 2015-05-24 HISTORY — DX: Chronic obstructive pulmonary disease, unspecified: J44.9

## 2015-05-24 LAB — LACTATE DEHYDROGENASE, PLEURAL OR PERITONEAL FLUID: LD FL: 24 U/L — AB (ref 3–23)

## 2015-05-24 LAB — CBC
HCT: 40.4 % (ref 39.0–52.0)
HEMOGLOBIN: 13.3 g/dL (ref 13.0–17.0)
MCH: 32.9 pg (ref 26.0–34.0)
MCHC: 32.9 g/dL (ref 30.0–36.0)
MCV: 100 fL (ref 78.0–100.0)
Platelets: 140 10*3/uL — ABNORMAL LOW (ref 150–400)
RBC: 4.04 MIL/uL — AB (ref 4.22–5.81)
RDW: 13.3 % (ref 11.5–15.5)
WBC: 6.1 10*3/uL (ref 4.0–10.5)

## 2015-05-24 LAB — COMPREHENSIVE METABOLIC PANEL
ALK PHOS: 85 U/L (ref 38–126)
ALT: 14 U/L — AB (ref 17–63)
AST: 25 U/L (ref 15–41)
Albumin: 2.1 g/dL — ABNORMAL LOW (ref 3.5–5.0)
Anion gap: 8 (ref 5–15)
BUN: <5 mg/dL — ABNORMAL LOW (ref 6–20)
CALCIUM: 8.2 mg/dL — AB (ref 8.9–10.3)
CO2: 30 mmol/L (ref 22–32)
CREATININE: 0.73 mg/dL (ref 0.61–1.24)
Chloride: 95 mmol/L — ABNORMAL LOW (ref 101–111)
Glucose, Bld: 115 mg/dL — ABNORMAL HIGH (ref 65–99)
Potassium: 3.6 mmol/L (ref 3.5–5.1)
Sodium: 133 mmol/L — ABNORMAL LOW (ref 135–145)
TOTAL PROTEIN: 5.9 g/dL — AB (ref 6.5–8.1)
Total Bilirubin: 1.3 mg/dL — ABNORMAL HIGH (ref 0.3–1.2)

## 2015-05-24 LAB — PROTIME-INR
INR: 1.34 (ref 0.00–1.49)
PROTHROMBIN TIME: 16.7 s — AB (ref 11.6–15.2)

## 2015-05-24 LAB — ALBUMIN, FLUID (OTHER)

## 2015-05-24 LAB — GLUCOSE, PERITONEAL FLUID: Glucose, Peritoneal Fluid: 115 mg/dL

## 2015-05-24 LAB — PROTEIN, BODY FLUID: Total protein, fluid: <3 g/dL

## 2015-05-24 MED ORDER — ALBUMIN HUMAN 25 % IV SOLN
25.0000 g | Freq: Once | INTRAVENOUS | Status: AC
  Administered 2015-05-24: 25 g via INTRAVENOUS
  Filled 2015-05-24: qty 100

## 2015-05-24 NOTE — ED Notes (Signed)
[facility], c/o SOB going on since Sat, hx of COPD, ran out of albuterol inhaler, SOB worse today, barrel chested.  PTA 5mg  albuterol neb.

## 2015-05-24 NOTE — ED Notes (Addendum)
Bedside Paracentis performed at bedside by MD Cyndie ChimeNguyen.

## 2015-05-24 NOTE — ED Provider Notes (Addendum)
CSN: [id number]     Arrival date & time 05/24/15  1626 History   First MD Initiated Contact with Patient 05/24/15 1627     Chief Complaint  Patient presents with  . Shortness of Breath     (Consider location/radiation/quality/duration/timing/severity/associated sxs/prior Treatment) HPI Comments: Patient with history of alcohol abuse, cirrhosis presents for abdominal pain and distention as well as shortness of breath and leg swelling. The patient reports these are the same symptoms he has had multiple times in the past that have required his belly to be drained. The patient was last seen here and underwent a paracentesis on December 13. He reports that he just recently gotten with the wellness Center but has not been following up outpatient regarding the need for his recurrent paracenteses. He denies fevers, chills, vomiting. He does state that he feels extremely short of breath and admits that he feels like his stomach starts to press up against his chest. Denies any chest pain. He says his abdominal pain is more significant on the right side.  Patient is a 54 y.o. male presenting with shortness of breath.  Shortness of Breath Associated symptoms: no cough, no diaphoresis, no fever, no headaches, no rash and no vomiting     Past Medical History  Diagnosis Date  . ETOH abuse   . Cirrhosis of liver (HCC)   . COPD (chronic obstructive pulmonary disease) [facility](HCC)    Past Surgical History  Procedure Laterality Date  . No past surgeries      jaw surgery 89109yrs ago   Family History  Problem Relation Age of Onset  . Diabetes Mother    Social History  Substance Use Topics  . Smoking status: Current Every Day Smoker -- 1.00 packs/day for 40 years    Types: Cigarettes  . Smokeless tobacco: Never Used  . Alcohol Use: 63.0 oz/week    105 Cans of beer per week     Comment: 04/28/15 states no drink in 3 weeks    Review of Systems  Constitutional: Negative for fever, diaphoresis, appetite  change and fatigue.  HENT: Negative for congestion, postnasal drip, rhinorrhea and sinus pressure.   Eyes: Negative for pain and redness.  Respiratory: Positive for shortness of breath. Negative for cough and chest tightness.   Cardiovascular: Negative for palpitations.  Gastrointestinal: Negative for nausea, vomiting and blood in stool.  Genitourinary: Negative for dysuria, urgency and hematuria.  Musculoskeletal: Negative for myalgias and back pain.  Skin: Negative for rash and wound.  Neurological: Negative for dizziness, weakness, light-headedness and headaches.  Hematological: Does not bruise/bleed easily.      Allergies  Review of patient's allergies indicates no known allergies.  Home Medications   Prior to Admission medications   Medication Sig Start Date End Date Taking? Authorizing Provider  albuterol (PROVENTIL HFA;VENTOLIN HFA) 108 (90 BASE) MCG/ACT inhaler Inhale 2 puffs into the lungs every 6 (six) hours as needed for wheezing or shortness of breath. 05/07/15  Yes [name], MD  folic acid (FOLVITE) 1 MG tablet Take 1 tablet (1 mg total) by mouth daily. 04/15/15  Yes Elease EtienneAnand D Hongalgi, MD  furosemide (LASIX) 40 MG tablet Take 1 tablet (40 mg total) by mouth daily. 05/07/15  Yes [name], MD  lactulose (CHRONULAC) 10 GM/15ML solution Take 15 mLs (10 g total) by mouth 2 (two) times daily. 04/25/15  Yes Dorothea OgleIskra M Myers, MD  pantoprazole (PROTONIX) 40 MG tablet Take 1 tablet (40 mg total) by mouth daily. 05/07/15  Yes [name] [name],  MD  potassium chloride SA (K-DUR,KLOR-CON) 20 MEQ tablet Take 1 tablet (20 mEq total) by mouth daily. 05/07/15  Yes [name], MD  spironolactone (ALDACTONE) 50 MG tablet Take 1 tablet (50 mg total) by mouth daily. 05/07/15  Yes [name], MD  thiamine 100 MG tablet Take 1 tablet (100 mg total) by mouth daily. 04/15/15  Yes Elease Etienne, MD  traMADol (ULTRAM) 50 MG tablet Take 1 tablet (50 mg total) by mouth every 8 (eight) hours as  needed. Patient taking differently: Take 50 mg by mouth every 8 (eight) hours as needed for moderate pain.  05/07/15  Yes [name] [name], MD   BP 101/66 mmHg  Pulse 91  Temp(Src) 98.5 F (36.9 C) (Oral)  Resp 18  Ht  (1.854 m)  Wt 194 lb (87.998 kg)  BMI 25.60 kg/m2  SpO2 97% Physical Exam  Constitutional: He is oriented to person, place, and time. He appears well-developed and well-nourished. No distress.  HENT:  Head: Normocephalic and atraumatic.  Right Ear: External ear normal.  Left Ear: External ear normal.  Mouth/Throat: Oropharynx is clear and moist. No oropharyngeal exudate.  Eyes: EOM are normal. Pupils are equal, round, and reactive to light.  Neck: Normal range of motion. Neck supple.  Cardiovascular: Regular rhythm, normal heart sounds and intact distal pulses.  Tachycardia present.   No murmur heard. Pulmonary/Chest: Effort normal. Tachypnea noted. No respiratory distress. He has no wheezes. He has no rales.  Abdominal: Soft. He exhibits distension. There is no tenderness.  Musculoskeletal: He exhibits no edema.  Neurological: He is alert and oriented to person, place, and time.  Skin: Skin is warm and dry. No rash noted. He is not diaphoretic.  Vitals reviewed.   ED Course  .Paracentesis Date/Time: 05/25/2015 2:28 AM Performed by: Tyrone Apple ROE Authorized by: Leta Baptist Consent: Verbal consent obtained. Risks and benefits: risks, benefits and alternatives were discussed Consent given by: patient Patient understanding: patient states understanding of the procedure being performed Patient consent: the patient's understanding of the procedure matches consent given Required items: required blood products, implants, devices, and special equipment available Patient identity confirmed: verbally with patient Procedure purpose: therapeutic Indications: abdominal discomfort secondary to ascites and respiratory distress secondary to ascites Anesthesia:  local infiltration Local anesthetic: lidocaine 1% without epinephrine Anesthetic total: 5 ml Patient sedated: no Preparation: Patient was prepped and draped in the usual sterile fashion. Needle gauge: 20 Ultrasound guidance: yes Puncture site: right lower quadrant Fluid removed: 8000(ml) Fluid appearance: clear Dressing: pressure dressing Patient tolerance: Patient tolerated the procedure well with no immediate complications   (including critical care time)  Labs Review Labs Reviewed  CBC - Abnormal; Notable for the following:    RBC 4.04 (*)    Platelets 140 (*)    All other components within normal limits  COMPREHENSIVE METABOLIC PANEL - Abnormal; Notable for the following:    Sodium 133 (*)    Chloride 95 (*)    Glucose, Bld 115 (*)    BUN <5 (*)    Calcium 8.2 (*)    Total Protein 5.9 (*)    Albumin 2.1 (*)    ALT 14 (*)    Total Bilirubin 1.3 (*)    All other components within normal limits  LACTATE DEHYDROGENASE, BODY FLUID - Abnormal; Notable for the following:    LD, Fluid 24 (*)    All other components within normal limits  PROTIME-INR - Abnormal; Notable for the following:  Prothrombin Time 16.7 (*)    All other components within normal limits  BODY FLUID CELL COUNT WITH DIFFERENTIAL - Abnormal; Notable for the following:    Color, Fluid YELLOW (*)    All other components within normal limits  BODY FLUID CULTURE  GLUCOSE, PERITONEAL FLUID  PROTEIN, BODY FLUID  ALBUMIN, FLUID    Imaging Review No results found. I have personally reviewed and evaluated these images and lab results as part of my medical decision-making.   EKG Interpretation   Date/Time:  Monday May 24 2015 16:33:48 EST Ventricular Rate:  102 PR Interval:  133 QRS Duration: 89 QT Interval:  338 QTC Calculation: 440 R Axis:   83 Text Interpretation:  Sinus tachycardia No significant change since last  tracing Confirmed by NGUYEN, EMILY ([id number]) on 05/24/2015 5:03:33 PM       MDM  Patient was seen and evaluated in stable condition. Patient with shortness of breath secondary to severe ascites. Examination did not seem consistent with SBP but in light of respiratory status with significant ascites paracentesis completed without complication as detailed above. Ascitic fluid not consistent with infection. Patient's breathing much improved after removal of ascitic fluid.  Patient was given IV albumin. Discussed at length with patient the need for outpatient follow-up and that this should not be a routine emergency Department procedure. He expressed understanding and said that he would make arrangements for follow-up outpatient. He was discharged home in stable condition with all questions answered. Final diagnoses:  Ascites    1. Ascites      Leta Baptist, MD 05/25/15 [phone]

## 2015-05-25 LAB — BODY FLUID CELL COUNT WITH DIFFERENTIAL
Eos, Fluid: 0 %
LYMPHS FL: 16 %
MONOCYTE-MACROPHAGE-SEROUS FLUID: 81 % (ref 50–90)
NEUTROPHIL FLUID: 3 % (ref 0–25)
Total Nucleated Cell Count, Fluid: 110 cu mm (ref 0–1000)

## 2015-05-25 NOTE — Discharge Instructions (Signed)
You were seen today for shortness of breath and abdominal distention. The fluid draining from her abdomen does not show any signs of infection. You need to make arrangements to follow up outpatient regarding having this issue addressed in the safest way for you. These procedures can be done outpatient through interventional radiology and your primary care physician can help to facilitate this.  Ascites Ascites is a collection of excess fluid in the abdomen. Ascites can range from mild to severe. It can get worse without treatment. CAUSES Possible causes include:  Cirrhosis. This is the most common cause of ascites.  Infection or inflammation in the abdomen.  Cancer in the abdomen.  Heart failure.  Kidney disease.  Inflammation of the pancreas.  Clots in the veins of the liver. SIGNS AND SYMPTOMS Signs and symptoms may include:  A feeling of fullness in your abdomen. This is common.  An increase in the size of your abdomen or your waist.  Swelling in your legs.  Swelling of the scrotum in men.  Difficulty breathing.  Abdominal pain.  Sudden weight gain. If the condition is mild, you may not have symptoms. DIAGNOSIS To make a diagnosis, your health care provider will:  Ask about your medical history.  Perform a physical exam.  Order imaging tests, such as an ultrasound or CT scan of your abdomen. TREATMENT Treatment depends on the cause of the ascites. It may include:  Taking a pill to make you urinate. This is called a water pill (diuretic pill).  Strictly reducing your salt (sodium) intake. Salt can cause extra fluid to be kept in the body, and this makes ascites worse.  Having a procedure to remove fluid from your abdomen (paracentesis).  Having a procedure to transfer fluid from your abdomen into a vein.  Having a procedure that connects two of the major veins within your liver and relieves pressure on your liver (TIPS procedure). Ascites may go away or  improve with treatment of the condition that caused it.  HOME CARE INSTRUCTIONS  Keep track of your weight. To do this, weigh yourself at the same time every day and record your weight.  Keep track of how much you drink and any changes in the amount you urinate.  Follow any instructions that your health care provider gives you about how much to drink.  Try not to eat salty (high-sodium) foods.  Take medicines only as directed by your health care provider.  Keep all follow-up visits as directed by your health care provider. This is important.  Report any changes in your health to your health care provider, especially if you develop new symptoms or your symptoms get worse. SEEK MEDICAL CARE IF:  Your gain more than 3 pounds in 3 days.  Your abdominal size or your waist size increases.  You have new swelling in your legs.  The swelling in your legs gets worse. SEEK IMMEDIATE MEDICAL CARE IF:  You develop a fever.  You develop confusion.  You develop new or worsening difficulty breathing.  You develop new or worsening abdominal pain.  You develop new or worsening swelling in the scrotum (in men).   This information is not intended to replace advice given to you by your health care provider. Make sure you discuss any questions you have with your health care provider.   Document Released: 05/15/2005 Document Revised: 06/05/2014 Document Reviewed: 12/12/2013 Elsevier Interactive Patient Education [employer]2016 Elsevier Inc.

## 2015-05-26 LAB — PATHOLOGIST SMEAR REVIEW

## 2015-05-27 ENCOUNTER — Telehealth: Payer: Self-pay | Admitting: Clinical

## 2015-05-27 NOTE — Telephone Encounter (Signed)
Attempt to determine if Mr. [name] has [facility]; no voicemail, no message left.

## 2015-05-28 ENCOUNTER — Encounter: Payer: Self-pay | Admitting: Family Medicine

## 2015-05-28 ENCOUNTER — Ambulatory Visit: Payer: Self-pay | Attending: Family Medicine | Admitting: Family Medicine

## 2015-05-28 ENCOUNTER — Encounter: Payer: Self-pay | Admitting: Clinical

## 2015-05-28 VITALS — BP 130/88 | HR 97 | Temp 97.8°F | Resp 14 | Ht 73.0 in | Wt 197.6 lb

## 2015-05-28 DIAGNOSIS — F419 Anxiety disorder, unspecified: Principal | ICD-10-CM

## 2015-05-28 DIAGNOSIS — R600 Localized edema: Secondary | ICD-10-CM | POA: Insufficient documentation

## 2015-05-28 DIAGNOSIS — F329 Major depressive disorder, single episode, unspecified: Secondary | ICD-10-CM

## 2015-05-28 DIAGNOSIS — R109 Unspecified abdominal pain: Secondary | ICD-10-CM | POA: Insufficient documentation

## 2015-05-28 DIAGNOSIS — R0602 Shortness of breath: Secondary | ICD-10-CM | POA: Insufficient documentation

## 2015-05-28 DIAGNOSIS — K7031 Alcoholic cirrhosis of liver with ascites: Secondary | ICD-10-CM | POA: Insufficient documentation

## 2015-05-28 DIAGNOSIS — Z79899 Other long term (current) drug therapy: Secondary | ICD-10-CM | POA: Insufficient documentation

## 2015-05-28 LAB — BODY FLUID CULTURE: CULTURE: NO GROWTH

## 2015-05-28 MED ORDER — FUROSEMIDE 40 MG PO TABS: 40.0000 mg | ORAL_TABLET | Freq: Every day | ORAL | Status: DC

## 2015-05-28 MED ORDER — POTASSIUM CHLORIDE CRYS ER 20 MEQ PO TBCR: 20.0000 meq | EXTENDED_RELEASE_TABLET | Freq: Every day | ORAL | Status: DC

## 2015-05-28 MED ORDER — ALBUTEROL SULFATE HFA 108 (90 BASE) MCG/ACT IN AERS: 2.0000 | INHALATION_SPRAY | Freq: Four times a day (QID) | RESPIRATORY_TRACT | Status: DC | PRN

## 2015-05-28 MED ORDER — LACTULOSE 10 GM/15ML PO SOLN: 10.0000 g | Freq: Two times a day (BID) | ORAL | Status: DC

## 2015-05-28 MED ORDER — PANTOPRAZOLE SODIUM 40 MG PO TBEC: 40.0000 mg | DELAYED_RELEASE_TABLET | Freq: Every day | ORAL | Status: DC

## 2015-05-28 MED ORDER — SPIRONOLACTONE 50 MG PO TABS: 50.0000 mg | ORAL_TABLET | Freq: Every day | ORAL | Status: DC

## 2015-05-28 NOTE — Progress Notes (Signed)
Subjective:  Patient ID: [name], male    DOB: [birth year]  Age: 54 y.o. MRN: [id number]  CC: Follow-up and Cirrhosis   HPI [name] is a 54 year old male with a history of alcoholic liver cirrhosis and massive ascites who comes into the clinic for a follow-up visit. Since his last office visit 3 weeks ago he has had abdominal paracentesis on 12/14, 12/16 and 12/26 with a total of 22 L of fluid removed. Today he complains of shortness of breath and abdominal pain; he has been unable to see GI because he states he could not afford it and he has not yet applied for the [facility] discount because he could not understand the paperwork. He no longer drinks alcohol He is completely out of all his medications and states he is also unable to afford his medications.  Outpatient Prescriptions Prior to Visit  Medication Sig Dispense Refill  . folic acid (FOLVITE) 1 MG tablet Take 1 tablet (1 mg total) by mouth daily. 30 tablet 0  . thiamine 100 MG tablet Take 1 tablet (100 mg total) by mouth daily. 30 tablet 0  . traMADol (ULTRAM) 50 MG tablet Take 1 tablet (50 mg total) by mouth every 8 (eight) hours as needed. (Patient taking differently: Take 50 mg by mouth every 8 (eight) hours as needed for moderate pain. ) 30 tablet 0  . albuterol (PROVENTIL HFA;VENTOLIN HFA) 108 (90 BASE) MCG/ACT inhaler Inhale 2 puffs into the lungs every 6 (six) hours as needed for wheezing or shortness of breath. 1 Inhaler 1  . furosemide (LASIX) 40 MG tablet Take 1 tablet (40 mg total) by mouth daily. 30 tablet 2  . lactulose (CHRONULAC) 10 GM/15ML solution Take 15 mLs (10 g total) by mouth 2 (two) times daily. 946 mL 2  . pantoprazole (PROTONIX) 40 MG tablet Take 1 tablet (40 mg total) by mouth daily. 30 tablet 2  . potassium chloride SA (K-DUR,KLOR-CON) 20 MEQ tablet Take 1 tablet (20 mEq total) by mouth daily. 30 tablet 1  . spironolactone (ALDACTONE) 50 MG tablet Take 1 tablet (50 mg total) by mouth daily. 30  tablet 2   No facility-administered medications prior to visit.    ROS Review of Systems Constitutional: Negative for fever, chills, diaphoresis, activity change, appetite change and fatigue. HENT: Negative for ear pain, nosebleeds, congestion, facial swelling, rhinorrhea, neck pain, neck stiffness and ear discharge.  Eyes: Negative for pain, discharge, redness, itching and visual disturbance. Respiratory: Negative for cough, choking, chest tightness, shortness of breath, wheezing and stridor.  Cardiovascular: Negative for chest pain, palpitations and positive for leg swelling. Gastrointestinal: Positive for abdominal distention. Genitourinary: Negative for dysuria, urgency, frequency, hematuria, flank pain, decreased urine volume, difficulty urinating and dyspareunia.  Musculoskeletal: Negative for back pain, positive for leg swelling and leg cramps Neurological: Negative for dizziness, tremors, seizures, syncope, facial asymmetry, speech difficulty, weakness, light-headedness, numbness and headaches.  Hematological: Negative for adenopathy. Does not bruise/bleed easily. Psychiatric/Behavioral: Negative for hallucinations, behavioral problems, confusion, dysphoric mood, decreased concentration and agitation.   Objective:  BP 130/88 mmHg  Pulse 97  Temp(Src) 97.8 F (36.6 C)  Resp 14  Ht  (1.854 m)  Wt 197 lb 9.6 oz (89.631 kg)  BMI 26.08 kg/m2  SpO2 100%  BP/Weight 05/28/2015 05/25/2015 05/24/2015  Systolic BP 130 101 -  Diastolic BP 88 66 -  Wt. (Lbs) 197.6 - 194  BMI 26.08 - 25.6  Some encounter information is confidential and restricted. Go to Review  Flowsheets activity to see all data.    CMP Latest Ref Rng 05/24/2015 05/12/2015 05/07/2015  Glucose 65 - 99 mg/dL 409(W115(H) 119(J108(H) 478(G100(H)  BUN 6 - 20 mg/dL <9(F<5(L) 6 8  Creatinine 6.210.61 - 1.24 mg/dL 3.080.73 6.570.98 8.46(N0.66(L)  Sodium 135 - 145 mmol/L 133(L) 131(L) 132(L)  Potassium 3.5 - 5.1 mmol/L 3.6 3.7 4.0  Chloride 101 - 111  mmol/L 95(L) 100(L) 97(L)  CO2 22 - 32 mmol/L 30 25 24   Calcium 8.9 - 10.3 mg/dL 8.2(L) 8.2(L) 8.2(L)  Total Protein 6.5 - 8.1 g/dL 5.9(L) 5.5(L) 5.9(L)  Total Bilirubin 0.3 - 1.2 mg/dL 6.2(X1.3(H) 0.9 5.2(W1.5(H)  Alkaline Phos 38 - 126 U/L 85 82 84  AST 15 - 41 U/L 25 23 24   ALT 17 - 63 U/L 14(L) 14(L) 13    CBC    Component Value Date/Time   WBC 6.1 05/24/2015 1730   RBC 4.04* 05/24/2015 1730   HGB 13.3 05/24/2015 1730   HCT 40.4 05/24/2015 1730   HCT 39.5 03/13/2015 0558   PLT 140* 05/24/2015 1730   MCV 100.0 05/24/2015 1730   MCH 32.9 05/24/2015 1730   MCHC 32.9 05/24/2015 1730   RDW 13.3 05/24/2015 1730   LYMPHSABS 1.9 05/12/2015 0029   MONOABS 1.1* 05/12/2015 0029   EOSABS 0.3 05/12/2015 0029   BASOSABS 0.1 05/12/2015 0029      Physical Exam Physical Exam: Constitutional: Patient appears, chronically ill looking CVS: RRR, S1/S2 +, no murmurs, no gallops, no carotid bruit.  Pulmonary: Mild respiratory difficulty, no stridor, rhonchi, wheezes, rales.  Abdominal: Massive ascites Musculoskeletal: 3+ bilateral pedal edema up to the thighs Lymphadenopathy: No lymphadenopathy noted, cervical, inguinal or axillary Neuro: Alert. Normal reflexes, muscle tone coordination. No cranial nerve deficit. Skin: Skin is warm and dry. No rash noted. Not diaphoretic. No erythema. No pallor. Psychiatric: Normal mood and affect. Behavior, judgment, thought content normal.  Assessment & Plan:   1. Alcoholic cirrhosis of liver with ascites (HCC) He has some form of respiratory difficulties secondary to massive ascites and so will be needing paracenteses. We will also call the ultrasound department to schedule a couple of abdominal paracenteses in advance to prevent multiple ED visits. He is unable to afford his medications today and so I have explained to him to the pharmacy will be willing to work with him today. Unable to be referred to GI because he is yet to complete his [facility] card and [facility] and LCSW has been called in to help him. - US Paracentesis; Future - furosemide (LASIX) 40 MG tablet; Take 1 tablet (40 mg total) by mouth daily.  Dispense: 30 tablet; Refill: 2 - pantoprazole (PROTONIX) 40 MG tablet; Take 1 tablet (40 mg total) by mouth daily.  Dispense: 30 tablet; Refill: 2 - spironolactone (ALDACTONE) 50 MG tablet; Take 1 tablet (50 mg total) by mouth daily.  Dispense: 30 tablet; Refill: 2 - lactulose (CHRONULAC) 10 GM/15ML solution; Take 15 mLs (10 g total) by mouth 2 (two) times daily.  Dispense: 946 mL; Refill: 2  2. Pedal edema Secondary to liver cirrhosis   Meds ordered this encounter  Medications  . furosemide (LASIX) 40 MG tablet    Sig: Take 1 tablet (40 mg total) by mouth daily.    Dispense:  30 tablet    Refill:  2  . pantoprazole (PROTONIX) 40 MG tablet    Sig: Take 1 tablet (40 mg total) by mouth daily.    Dispense:  30 tablet  Refill:  2  . spironolactone (ALDACTONE) 50 MG tablet    Sig: Take 1 tablet (50 mg total) by mouth daily.    Dispense:  30 tablet    Refill:  2  . lactulose (CHRONULAC) 10 GM/15ML solution    Sig: Take 15 mLs (10 g total) by mouth 2 (two) times daily.    Dispense:  946 mL    Refill:  2  . potassium chloride SA (K-DUR,KLOR-CON) 20 MEQ tablet    Sig: Take 1 tablet (20 mEq total) by mouth daily.    Dispense:  30 tablet    Refill:  2  . albuterol (PROVENTIL HFA;VENTOLIN HFA) 108 (90 Base) MCG/ACT inhaler    Sig: Inhale 2 puffs into the lungs every 6 (six) hours as needed for wheezing or shortness of breath.    Dispense:  1 Inhaler    Refill:  1    Follow-up: Return in about 2 weeks (around 06/11/2015) for follow up of liver cirrhosis and ascites.   [name] MD

## 2015-05-28 NOTE — Progress Notes (Signed)
Patient here for follow up on his ETOH cirrhosis He states he feels abdominal tightness pain is 8/10 He needs refills on all medications Patient scores 21 on his PHQ-9-will have social worker speak to him

## 2015-05-28 NOTE — Progress Notes (Unsigned)
ASSESSMENT: Pt currently experiencing symptoms of anxiety and depression. Pt [facility]; would benefit from community resources and supportive counseling regarding coping with symptoms of anxiety and depression.  Stage of Change: precontemplative  PLAN: 1. F/U with behavioral health consultant in as needed 2. Psychiatric Medications: none 3. Behavioral recommendation(s):   -Continue w AA meetings -Take home food resources for today -Consider [employer] of food resources -Make financial counseling appointment at [facility]  SUBJECTIVE: Pt. referred by Dr Venetia NightAmao for symptoms of anxiety and depression Pt. reports the following symptoms/concerns: Pt states that he has been going to new AA meetings, and that it has helped him to stay sober, his primary concern today is lack of food resources and needs help filling out paperwork to obtain orange card and [place] financial discount. Pt states he is not interested in case management services.  Duration of problem: Less than one month (help with paperwork)/ undetermined BH length Severity: mild/ severe  OBJECTIVE: Orientation & Cognition: Oriented x3. Thought processes normal and appropriate to situation. Mood: appropriate. Affect: appropriate Appearance: appropriate Risk of harm to self or others: no known risk of harm to self or others Substance use: alcohol, tobacco Assessments administered: PHQ9: 21/ GAD7: 21  Diagnosis: Anxiety and depression CPT Code: F41.8 -------------------------------------------- Other(s) present in the room: none  Time spent with patient in exam room: 16 minutes

## 2015-06-02 ENCOUNTER — Encounter (HOSPITAL_COMMUNITY)
Admission: RE | Admit: 2015-06-02 | Discharge: 2015-06-02 | Disposition: A | Discharge status: Home / Self Care | Payer: Self-pay | Source: Ambulatory Visit | Attending: Family Medicine | Admitting: Family Medicine

## 2015-06-02 ENCOUNTER — Ambulatory Visit (HOSPITAL_COMMUNITY)
Admission: RE | Admit: 2015-06-02 | Discharge: 2015-06-02 | Disposition: A | Discharge status: Home / Self Care | Payer: Self-pay | Source: Ambulatory Visit | Attending: Family Medicine | Admitting: Family Medicine

## 2015-06-02 DIAGNOSIS — K7460 Unspecified cirrhosis of liver: Secondary | ICD-10-CM | POA: Insufficient documentation

## 2015-06-02 DIAGNOSIS — K7031 Alcoholic cirrhosis of liver with ascites: Secondary | ICD-10-CM

## 2015-06-02 DIAGNOSIS — R188 Other ascites: Secondary | ICD-10-CM | POA: Insufficient documentation

## 2015-06-02 MED ORDER — ALBUMIN HUMAN 25 % IV SOLN
25.0000 g | Freq: Once | INTRAVENOUS | Status: AC
  Administered 2015-06-02: 25 g via INTRAVENOUS
  Filled 2015-06-02: qty 100

## 2015-06-02 NOTE — Procedures (Signed)
   US guided RLQ paracentesis 8 liter yellow fluid --maximum removed  Post procedure 25 gr IV albumin

## 2015-06-07 ENCOUNTER — Emergency Department (HOSPITAL_COMMUNITY)
Admission: EM | Admit: 2015-06-07 | Discharge: 2015-06-08 | Disposition: A | Discharge status: Home / Self Care | Payer: Self-pay | Attending: Emergency Medicine | Admitting: Emergency Medicine

## 2015-06-07 ENCOUNTER — Encounter (HOSPITAL_COMMUNITY): Payer: Self-pay | Admitting: Emergency Medicine

## 2015-06-07 ENCOUNTER — Emergency Department (HOSPITAL_COMMUNITY): Payer: Self-pay

## 2015-06-07 DIAGNOSIS — R188 Other ascites: Secondary | ICD-10-CM | POA: Insufficient documentation

## 2015-06-07 DIAGNOSIS — J441 Chronic obstructive pulmonary disease with (acute) exacerbation: Secondary | ICD-10-CM | POA: Insufficient documentation

## 2015-06-07 DIAGNOSIS — R0602 Shortness of breath: Secondary | ICD-10-CM

## 2015-06-07 DIAGNOSIS — R140 Abdominal distension (gaseous): Secondary | ICD-10-CM | POA: Insufficient documentation

## 2015-06-07 DIAGNOSIS — R1084 Generalized abdominal pain: Secondary | ICD-10-CM

## 2015-06-07 DIAGNOSIS — Z8719 Personal history of other diseases of the digestive system: Secondary | ICD-10-CM | POA: Insufficient documentation

## 2015-06-07 DIAGNOSIS — Z79899 Other long term (current) drug therapy: Secondary | ICD-10-CM | POA: Insufficient documentation

## 2015-06-07 DIAGNOSIS — R112 Nausea with vomiting, unspecified: Secondary | ICD-10-CM | POA: Insufficient documentation

## 2015-06-07 DIAGNOSIS — F17210 Nicotine dependence, cigarettes, uncomplicated: Secondary | ICD-10-CM | POA: Insufficient documentation

## 2015-06-07 LAB — COMPREHENSIVE METABOLIC PANEL
ALT: 14 U/L — ABNORMAL LOW (ref 17–63)
ANION GAP: 11 (ref 5–15)
AST: 29 U/L (ref 15–41)
Albumin: 2.2 g/dL — ABNORMAL LOW (ref 3.5–5.0)
Alkaline Phosphatase: 94 U/L (ref 38–126)
BILIRUBIN TOTAL: 1.4 mg/dL — AB (ref 0.3–1.2)
CO2: 23 mmol/L (ref 22–32)
Calcium: 8.2 mg/dL — ABNORMAL LOW (ref 8.9–10.3)
Chloride: 98 mmol/L — ABNORMAL LOW (ref 101–111)
Creatinine, Ser: 0.72 mg/dL (ref 0.61–1.24)
GFR calc Af Amer: >60 mL/min (ref >60)
Glucose, Bld: 102 mg/dL — ABNORMAL HIGH (ref 65–99)
POTASSIUM: 3.4 mmol/L — AB (ref 3.5–5.1)
Sodium: 132 mmol/L — ABNORMAL LOW (ref 135–145)
TOTAL PROTEIN: 6.2 g/dL — AB (ref 6.5–8.1)

## 2015-06-07 LAB — CBC WITH DIFFERENTIAL/PLATELET
Basophils Absolute: 0.1 10*3/uL (ref 0.0–0.1)
Basophils Relative: 1 %
EOS PCT: 2 %
Eosinophils Absolute: 0.2 10*3/uL (ref 0.0–0.7)
HEMATOCRIT: 39.9 % (ref 39.0–52.0)
Hemoglobin: 13.6 g/dL (ref 13.0–17.0)
Lymphocytes Relative: 25 %
Lymphs Abs: 3.1 10*3/uL (ref 0.7–4.0)
MCH: 33.3 pg (ref 26.0–34.0)
MCHC: 34.1 g/dL (ref 30.0–36.0)
MCV: 97.6 fL (ref 78.0–100.0)
MONO ABS: 1 10*3/uL (ref 0.1–1.0)
Monocytes Relative: 8 %
NEUTROS PCT: 64 %
Neutro Abs: 7.8 10*3/uL — ABNORMAL HIGH (ref 1.7–7.7)
Platelets: 178 10*3/uL (ref 150–400)
RBC: 4.09 MIL/uL — ABNORMAL LOW (ref 4.22–5.81)
RDW: 13.5 % (ref 11.5–15.5)
WBC: 12.2 10*3/uL — AB (ref 4.0–10.5)

## 2015-06-07 LAB — LACTATE DEHYDROGENASE, PLEURAL OR PERITONEAL FLUID: LD, Fluid: 28 U/L — ABNORMAL HIGH (ref 3–23)

## 2015-06-07 LAB — PROTEIN, BODY FLUID

## 2015-06-07 LAB — PROTIME-INR
INR: 1.4 (ref 0.00–1.49)
PROTHROMBIN TIME: 17.3 s — AB (ref 11.6–15.2)

## 2015-06-07 LAB — GLUCOSE, SEROUS FLUID: Glucose, Fluid: 112 mg/dL

## 2015-06-07 LAB — AMMONIA: AMMONIA: 25 umol/L (ref 9–35)

## 2015-06-07 LAB — ALBUMIN, FLUID (OTHER): Albumin, Fluid: <1 g/dL

## 2015-06-07 LAB — LIPASE, BLOOD: LIPASE: 44 U/L (ref 11–51)

## 2015-06-07 MED ORDER — OXYCODONE HCL 5 MG PO TABS
5.0000 mg | ORAL_TABLET | Freq: Once | ORAL | Status: AC
  Administered 2015-06-07: 5 mg via ORAL
  Filled 2015-06-07: qty 1

## 2015-06-07 MED ORDER — ONDANSETRON HCL 4 MG/2ML IJ SOLN
4.0000 mg | Freq: Once | INTRAMUSCULAR | Status: AC
  Administered 2015-06-07: 4 mg via INTRAVENOUS
  Filled 2015-06-07: qty 2

## 2015-06-07 MED ORDER — HYDROMORPHONE HCL 1 MG/ML IJ SOLN
1.0000 mg | Freq: Once | INTRAMUSCULAR | Status: AC
  Administered 2015-06-07: 1 mg via INTRAVENOUS
  Filled 2015-06-07: qty 1

## 2015-06-07 NOTE — ED Notes (Signed)
Pt to ER with complaint of abdominal pain and swelling. Pt has cirrhosis of the liver and presents with ascites. Pt reports every 8 days he has fluid taken from his abdomen however it has been only 4 days since last fluid removal and it is accumulating faster than normal. Pt also has hx of COPD. Pt is a/o x4. Reports he is having a hard time breathing. BP 142/80, 78 HR, RR 18, 96% RA.

## 2015-06-07 NOTE — ED Provider Notes (Signed)
CSN: [id number]     Arrival date & time 06/07/15  1817 History   First MD Initiated Contact with Patient 06/07/15 1825     Chief Complaint  Patient presents with  . Abdominal Pain     (Consider location/radiation/quality/duration/timing/severity/associated sxs/prior Treatment) Patient is a 55 y.o. male presenting with abdominal pain. The history is provided by the patient.  Abdominal Pain Pain location:  Generalized Pain quality: aching, bloating and fullness   Pain radiates to:  Does not radiate Pain severity:  Moderate Onset quality:  Gradual Duration:  4 days Timing:  Constant Progression:  Worsening Chronicity:  Recurrent Context: not medication withdrawal, not recent illness, not recent sexual activity, not recent travel, not sick contacts, not suspicious food intake and not trauma   Context comment:  Pt has history of cirrhosis and recurrent ascites Relieved by:  Nothing Worsened by:  Palpation Ineffective treatments:  Position changes and lying down Associated symptoms: nausea, shortness of breath and vomiting   Associated symptoms: no anorexia, no belching, no chest pain, no chills, no constipation, no diarrhea, no dysuria, no fatigue, no fever, no flatus, no hematemesis, no hematochezia, no hematuria, no melena and no sore throat     Past Medical History  Diagnosis Date  . ETOH abuse   . Cirrhosis of liver (HCC)   . COPD (chronic obstructive pulmonary disease) [facility](HCC)    Past Surgical History  Procedure Laterality Date  . No past surgeries      jaw surgery 7081yrs ago   Family History  Problem Relation Age of Onset  . Diabetes Mother    Social History  Substance Use Topics  . Smoking status: Current Every Day Smoker -- 1.00 packs/day for 40 years    Types: Cigarettes  . Smokeless tobacco: Never Used  . Alcohol Use: No     Comment: 05/28/15-states still not drinking    Review of Systems  Constitutional: Negative for fever, chills and fatigue.  HENT: Negative  for sore throat.   Eyes: Negative for pain and visual disturbance.  Respiratory: Positive for shortness of breath. Negative for chest tightness.   Cardiovascular: Negative for chest pain.  Gastrointestinal: Positive for nausea, vomiting, abdominal pain and abdominal distention. Negative for diarrhea, constipation, melena, hematochezia, anorexia, flatus and hematemesis.  Genitourinary: Negative for dysuria, hematuria and flank pain.  Musculoskeletal: Negative for back pain and gait problem.  Neurological: Negative for dizziness and headaches.  Psychiatric/Behavioral: Negative for confusion.      Allergies  Review of patient's allergies indicates no known allergies.  Home Medications   Prior to Admission medications   Medication Sig Start Date End Date Taking? Authorizing Provider  albuterol (PROVENTIL HFA;VENTOLIN HFA) 108 (90 Base) MCG/ACT inhaler Inhale 2 puffs into the lungs every 6 (six) hours as needed for wheezing or shortness of breath. 05/28/15  Yes [name], MD  folic acid (FOLVITE) 1 MG tablet Take 1 tablet (1 mg total) by mouth daily. 04/15/15  Yes Elease EtienneAnand D Hongalgi, MD  furosemide (LASIX) 40 MG tablet Take 1 tablet (40 mg total) by mouth daily. 05/28/15  Yes [name], MD  potassium chloride SA (K-DUR,KLOR-CON) 20 MEQ tablet Take 1 tablet (20 mEq total) by mouth daily. 05/28/15  Yes [name], MD  lactulose (CHRONULAC) 10 GM/15ML solution Take 15 mLs (10 g total) by mouth 2 (two) times daily. Patient not taking: Reported on 06/07/2015 05/28/15   [name], MD  oxyCODONE (ROXICODONE) 5 MG immediate release tablet Take 1 tablet (5 mg total) by  mouth every 4 (four) hours as needed for severe pain. 06/08/15   Stacy Gardner, MD  pantoprazole (PROTONIX) 40 MG tablet Take 1 tablet (40 mg total) by mouth daily. Patient not taking: Reported on 06/07/2015 05/28/15   [name], MD  spironolactone (ALDACTONE) 50 MG tablet Take 1 tablet (50 mg total) by mouth daily. Patient not  taking: Reported on 06/07/2015 05/28/15   [name], MD  thiamine 100 MG tablet Take 1 tablet (100 mg total) by mouth daily. Patient not taking: Reported on 06/07/2015 04/15/15   Elease Etienne, MD  traMADol (ULTRAM) 50 MG tablet Take 1 tablet (50 mg total) by mouth every 8 (eight) hours as needed. Patient not taking: Reported on 06/07/2015 05/07/15   [name], MD   BP 107/79 mmHg  Pulse 100  Temp(Src) 97.9 F (36.6 C) (Oral)  Resp 18  SpO2 92% Physical Exam  Constitutional: He is oriented to person, place, and time. He appears well-developed and well-nourished. No distress.  HENT:  Head: Normocephalic and atraumatic.  Eyes: Conjunctivae and EOM are normal. Pupils are equal, round, and reactive to light.  Neck: Normal range of motion. Neck supple.  Cardiovascular: Normal rate and regular rhythm.  Exam reveals no gallop and no friction rub.   No murmur heard. Pulmonary/Chest: Effort normal. No respiratory distress. He has no wheezes. He has no rales. He exhibits no tenderness.  Abdominal: He exhibits distension and ascites. There is generalized tenderness. There is no rigidity, no rebound, no guarding and no CVA tenderness.  Neurological: He is alert and oriented to person, place, and time. No cranial nerve deficit.  Skin: Skin is warm and dry. No rash noted. No erythema.  Psychiatric: He has a normal mood and affect.    ED Course  .Paracentesis Date/Time: 06/08/2015 12:38 AM Performed by: Stacy Gardner Authorized by: Stacy Gardner Consent: Verbal consent obtained. Written consent obtained. Risks and benefits: risks, benefits and alternatives were discussed Consent given by: patient Patient understanding: patient states understanding of the procedure being performed Patient consent: the patient's understanding of the procedure matches consent given Procedure consent: procedure consent matches procedure scheduled Relevant documents: relevant documents present and  verified Test results: test results available and properly labeled Site marked: the operative site was marked Imaging studies: imaging studies available Required items: required blood products, implants, devices, and special equipment available Patient identity confirmed: verbally with patient Time out: Immediately prior to procedure a "time out" was called to verify the correct patient, procedure, equipment, support staff and site/side marked as required. Initial or subsequent exam: initial Procedure purpose: diagnostic Indications: abdominal discomfort secondary to ascites and respiratory distress secondary to ascites Anesthesia: local infiltration Local anesthetic: lidocaine 1% without epinephrine Anesthetic total: 3 ml Preparation: Patient was prepped and draped in the usual sterile fashion. Needle gauge: 18 Ultrasound guidance: yes Puncture site: left lower quadrant Fluid removed: 60(ml) Fluid characteristics: straw colored. Dressing: 4x4 sterile gauze Patient tolerance: Patient tolerated the procedure well with no immediate complications   (including critical care time) Labs Review Labs Reviewed  CBC WITH DIFFERENTIAL/PLATELET - Abnormal; Notable for the following:    WBC 12.2 (*)    RBC 4.09 (*)    Neutro Abs 7.8 (*)    All other components within normal limits  COMPREHENSIVE METABOLIC PANEL - Abnormal; Notable for the following:    Sodium 132 (*)    Potassium 3.4 (*)    Chloride 98 (*)    Glucose, Bld 102 (*)    BUN <  5 (*)    Calcium 8.2 (*)    Total Protein 6.2 (*)    Albumin 2.2 (*)    ALT 14 (*)    Total Bilirubin 1.4 (*)    All other components within normal limits  PROTIME-INR - Abnormal; Notable for the following:    Prothrombin Time 17.3 (*)    All other components within normal limits  LACTATE DEHYDROGENASE, BODY FLUID - Abnormal; Notable for the following:    LD, Fluid 28 (*)    All other components within normal limits  BODY FLUID CELL COUNT WITH  DIFFERENTIAL - Abnormal; Notable for the following:    Color, Fluid STRAW (*)    Appearance, Fluid HAZY (*)    All other components within normal limits  GRAM STAIN  CULTURE, BODY FLUID-BOTTLE  LIPASE, BLOOD  AMMONIA  GLUCOSE, SEROUS FLUID  PROTEIN, BODY FLUID  ALBUMIN, FLUID    Imaging Review Dg Chest Portable 1 View  06/07/2015  CLINICAL DATA:  Abdominal pain and swelling. Hepatic cirrhosis. Ascites. Faster accumulation of ascites. Difficulty breathing. EXAM: PORTABLE CHEST 1 VIEW COMPARISON:  05/06/2015 FINDINGS: The heart size and mediastinal contours are within normal limits. Both lungs are clear. Old right clavicular fracture. Suspected old bilateral rib fractures. IMPRESSION: 1. No acute cardiopulmonary findings. Electronically Signed   By: Gaylyn Rong M.D.   On: 06/07/2015 19:32   I have personally reviewed and evaluated these images and lab results as part of my medical decision-making.   EKG Interpretation None      MDM   Final diagnoses:  Ascites  Generalized abdominal pain  SOB (shortness of breath)    55 year old Caucasian male with history of chronic ascites presents in the setting of ascites, abdominal pain, shortness of breath. Patient reports he had his last paracentesis performed 4 days ago. He reports he has been getting paracentesis every 8 days. He reports due to continued pain today presented to emergency department. On arrival patient was symptomatically stable and afebrile. Patient had significant ascites and generalized abdominal pain. Patient had therapeutic paracentesis performed emergency department recently and patient was advised that therapeutic paracenteses cannot continue in emergency department. At this time due to significant pain we'll obtain paracentesis to rule out intra-abdominal infection specifically SBP. Additionally will obtain laboratory analysis. Do not believe therapeutic paracentesis is indicated as patient O2 saturations are within  normal limits.  Patient had elevation in white blood cell count but otherwise no significant laboratory abnormalities noted from baseline. Chest x-ray without significant abnormality. Paracentesis not consistent with spontaneous bacterial peritonitis. Patient's pain improved with oral medications. At this time will discharge home with short course of pain medications and planned follow-up in 2 days for prescheduled paracentesis. Patient advised to follow with PCP for further options regarding need for recurrent paracentesis. Patient stable at time of discharge and given strict return precautions. Patient in agreement with plan..  Attending has seen and evaluate patient and Dr. Dalene Seltzer is in agreement with plan.    Stacy Gardner, MD 06/08/15 [id number]  Alvira Monday, MD 06/09/15 1650

## 2015-06-08 LAB — BODY FLUID CELL COUNT WITH DIFFERENTIAL
EOS FL: 0 %
Lymphs, Fluid: 16 %
MONOCYTE-MACROPHAGE-SEROUS FLUID: 69 % (ref 50–90)
NEUTROPHIL FLUID: 15 % (ref 0–25)
Total Nucleated Cell Count, Fluid: 62 cu mm (ref 0–1000)

## 2015-06-08 LAB — PATHOLOGIST SMEAR REVIEW

## 2015-06-08 LAB — GRAM STAIN

## 2015-06-08 MED ORDER — OXYCODONE HCL 5 MG PO TABS: 5.0000 mg | ORAL_TABLET | ORAL | Status: DC | PRN

## 2015-06-08 NOTE — ED Notes (Signed)
Taxi voucher provided, signed by Consulting civil engineercharge RN.

## 2015-06-08 NOTE — Discharge Instructions (Signed)
Abdominal Pain, Adult Many things can cause abdominal pain. Usually, abdominal pain is not caused by a disease and will improve without treatment. It can often be observed and treated at home. Your health care provider will do a physical exam and possibly order blood tests and X-rays to help determine the seriousness of your pain. However, in many cases, more time must pass before a clear cause of the pain can be found. Before that point, your health care provider may not know if you need more testing or further treatment. HOME CARE INSTRUCTIONS Monitor your abdominal pain for any changes. The following actions may help to alleviate any discomfort you are experiencing:  Only take over-the-counter or prescription medicines as directed by your health care provider.  Do not take laxatives unless directed to do so by your health care provider.  Try a clear liquid diet (broth, tea, or water) as directed by your health care provider. Slowly move to a bland diet as tolerated. SEEK MEDICAL CARE IF:  You have unexplained abdominal pain.  You have abdominal pain associated with nausea or diarrhea.  You have pain when you urinate or have a bowel movement.  You experience abdominal pain that wakes you in the night.  You have abdominal pain that is worsened or improved by eating food.  You have abdominal pain that is worsened with eating fatty foods.  You have a fever. SEEK IMMEDIATE MEDICAL CARE IF:  Your pain does not go away within 2 hours.  You keep throwing up (vomiting).  Your pain is felt only in portions of the abdomen, such as the right side or the left lower portion of the abdomen.  You pass bloody or black tarry stools. MAKE SURE YOU:  Understand these instructions.  Will watch your condition.  Will get help right away if you are not doing well or get worse.   This information is not intended to replace advice given to you by your health care provider. Make sure you discuss  any questions you have with your health care provider.   Document Released: 02/22/2005 Document Revised: 02/03/2015 Document Reviewed: 01/22/2013 Elsevier Interactive Patient Education 2016 Elsevier Inc.  Ascites Ascites is a collection of excess fluid in the abdomen. Ascites can range from mild to severe. It can get worse without treatment. CAUSES Possible causes include:  Cirrhosis. This is the most common cause of ascites.  Infection or inflammation in the abdomen.  Cancer in the abdomen.  Heart failure.  Kidney disease.  Inflammation of the pancreas.  Clots in the veins of the liver. SIGNS AND SYMPTOMS Signs and symptoms may include:  A feeling of fullness in your abdomen. This is common.  An increase in the size of your abdomen or your waist.  Swelling in your legs.  Swelling of the scrotum in men.  Difficulty breathing.  Abdominal pain.  Sudden weight gain. If the condition is mild, you may not have symptoms. DIAGNOSIS To make a diagnosis, your health care provider will:  Ask about your medical history.  Perform a physical exam.  Order imaging tests, such as an ultrasound or CT scan of your abdomen. TREATMENT Treatment depends on the cause of the ascites. It may include:  Taking a pill to make you urinate. This is called a water pill (diuretic pill).  Strictly reducing your salt (sodium) intake. Salt can cause extra fluid to be kept in the body, and this makes ascites worse.  Having a procedure to remove fluid from your abdomen (  paracentesis).  Having a procedure to transfer fluid from your abdomen into a vein.  Having a procedure that connects two of the major veins within your liver and relieves pressure on your liver (TIPS procedure). Ascites may go away or improve with treatment of the condition that caused it.  HOME CARE INSTRUCTIONS  Keep track of your weight. To do this, weigh yourself at the same time every day and record your  weight.  Keep track of how much you drink and any changes in the amount you urinate.  Follow any instructions that your health care provider gives you about how much to drink.  Try not to eat salty (high-sodium) foods.  Take medicines only as directed by your health care provider.  Keep all follow-up visits as directed by your health care provider. This is important.  Report any changes in your health to your health care provider, especially if you develop new symptoms or your symptoms get worse. SEEK MEDICAL CARE IF:  Your gain more than 3 pounds in 3 days.  Your abdominal size or your waist size increases.  You have new swelling in your legs.  The swelling in your legs gets worse. SEEK IMMEDIATE MEDICAL CARE IF:  You develop a fever.  You develop confusion.  You develop new or worsening difficulty breathing.  You develop new or worsening abdominal pain.  You develop new or worsening swelling in the scrotum (in men).   This information is not intended to replace advice given to you by your health care provider. Make sure you discuss any questions you have with your health care provider.   Document Released: 05/15/2005 Document Revised: 06/05/2014 Document Reviewed: 12/12/2013 Elsevier Interactive Patient Education 2016 Elsevier Inc.   

## 2015-06-08 NOTE — ED Notes (Signed)
Dr. Lonia MadSeymore at the bedside to update plan of care. Preparing for discharge.

## 2015-06-09 ENCOUNTER — Emergency Department (HOSPITAL_COMMUNITY)
Admission: EM | Admit: 2015-06-09 | Discharge: 2015-06-09 | Disposition: A | Discharge status: Home / Self Care | Payer: Self-pay | Attending: Emergency Medicine | Admitting: Emergency Medicine

## 2015-06-09 ENCOUNTER — Encounter (HOSPITAL_COMMUNITY): Payer: Self-pay

## 2015-06-09 DIAGNOSIS — R000 Tachycardia, unspecified: Secondary | ICD-10-CM | POA: Insufficient documentation

## 2015-06-09 DIAGNOSIS — J441 Chronic obstructive pulmonary disease with (acute) exacerbation: Secondary | ICD-10-CM | POA: Insufficient documentation

## 2015-06-09 DIAGNOSIS — Z79899 Other long term (current) drug therapy: Secondary | ICD-10-CM | POA: Insufficient documentation

## 2015-06-09 DIAGNOSIS — R188 Other ascites: Secondary | ICD-10-CM | POA: Insufficient documentation

## 2015-06-09 DIAGNOSIS — F17210 Nicotine dependence, cigarettes, uncomplicated: Secondary | ICD-10-CM | POA: Insufficient documentation

## 2015-06-09 DIAGNOSIS — Z8719 Personal history of other diseases of the digestive system: Secondary | ICD-10-CM | POA: Insufficient documentation

## 2015-06-09 MED ORDER — OXYCODONE HCL ER 10 MG PO T12A
10.0000 mg | EXTENDED_RELEASE_TABLET | Freq: Once | ORAL | Status: AC
  Administered 2015-06-09: 10 mg via ORAL
  Filled 2015-06-09: qty 1

## 2015-06-09 NOTE — Progress Notes (Signed)
CSW met with patient at bedside.   CSW provided the patient with [employer] transportation application, and [place] information. CSW encouraged patient to go to DSS to inquire about insurance information and transportation for future appointments.  CSW provided the the patient with a bus pass.  Willette Brace [phone] ED CSW 06/09/2015  11:01 PM

## 2015-06-09 NOTE — Progress Notes (Signed)
[facility]  [facility].  Patient will be provided bus passes here in ED.  EDCM will email [facility] to make them aware of patient's transportation issues.  Patient without insurance.  [facility]

## 2015-06-09 NOTE — ED Notes (Signed)
Bed: [id number] Expected date:  Expected time:  Means of arrival:  Comments: Hall C- EKG

## 2015-06-09 NOTE — ED Provider Notes (Signed)
CSN: [id number]     Arrival date & time 06/09/15  1715 History   First MD Initiated Contact with Patient 06/09/15 1727     Chief Complaint  Patient presents with  . Abdominal Pain     (Consider location/radiation/quality/duration/timing/severity/associated sxs/prior Treatment) HPI Comments: 55 year old male with history of alcoholic cirrhosis, chronic ascites requiring multiple paracenteses presents for abdominal pain and distention as well as shortness of breath. The patient reports that he can't handle the pain that he has at home and that he is not going to be able to make his appointment for an ultrasound-guided paracentesis tomorrow because of transportation issues. The patient frequents the ER for the same issue. He said that he just couldn't handle the pain and so he came back to the emergency department. He says that he can't keep living like this but has no thoughts of suicide. Denies fever, nausea, vomiting. He reports that he chronically has shortness of breath.  Patient is a 55 y.o. male presenting with abdominal pain.  Abdominal Pain Associated symptoms: shortness of breath (chronic)   Associated symptoms: no chest pain, no chills, no cough, no diarrhea, no dysuria, no fever, no hematuria, no nausea and no vomiting     Past Medical History  Diagnosis Date  . ETOH abuse   . Cirrhosis of liver (HCC)   . COPD (chronic obstructive pulmonary disease) [facility](HCC)    Past Surgical History  Procedure Laterality Date  . No past surgeries      jaw surgery 4458yrs ago   Family History  Problem Relation Age of Onset  . Diabetes Mother    Social History  Substance Use Topics  . Smoking status: Current Every Day Smoker -- 1.00 packs/day for 40 years    Types: Cigarettes  . Smokeless tobacco: Never Used  . Alcohol Use: No     Comment: 05/28/15-states still not drinking    Review of Systems  Constitutional: Negative for fever and chills.  HENT: Negative for congestion, postnasal drip  and rhinorrhea.   Respiratory: Positive for shortness of breath (chronic). Negative for cough and chest tightness.   Cardiovascular: Negative for chest pain and palpitations.  Gastrointestinal: Positive for abdominal pain and abdominal distention. Negative for nausea, vomiting and diarrhea.  Genitourinary: Negative for dysuria and hematuria.  Musculoskeletal: Negative for myalgias and back pain.      Allergies  Review of patient's allergies indicates no known allergies.  Home Medications   Prior to Admission medications   Medication Sig Start Date End Date Taking? Authorizing Provider  albuterol (PROVENTIL HFA;VENTOLIN HFA) 108 (90 Base) MCG/ACT inhaler Inhale 2 puffs into the lungs every 6 (six) hours as needed for wheezing or shortness of breath. 05/28/15  Yes [name], MD  folic acid (FOLVITE) 1 MG tablet Take 1 tablet (1 mg total) by mouth daily. 04/15/15  Yes Elease EtienneAnand D Hongalgi, MD  furosemide (LASIX) 40 MG tablet Take 1 tablet (40 mg total) by mouth daily. 05/28/15  Yes [name], MD  lactulose (CHRONULAC) 10 GM/15ML solution Take 15 mLs (10 g total) by mouth 2 (two) times daily. 05/28/15  Yes [name], MD  pantoprazole (PROTONIX) 40 MG tablet Take 1 tablet (40 mg total) by mouth daily. 05/28/15  Yes [name], MD  oxyCODONE (ROXICODONE) 5 MG immediate release tablet Take 1 tablet (5 mg total) by mouth every 4 (four) hours as needed for severe pain. 06/08/15   Stacy GardnerAndrew Seymore, MD  potassium chloride SA (K-DUR,KLOR-CON) 20 MEQ tablet Take 1 tablet (  20 mEq total) by mouth daily. 05/28/15   [name], MD  spironolactone (ALDACTONE) 50 MG tablet Take 1 tablet (50 mg total) by mouth daily. 05/28/15   [name], MD  thiamine 100 MG tablet Take 1 tablet (100 mg total) by mouth daily. Patient not taking: Reported on 06/07/2015 04/15/15   Elease Etienne, MD  traMADol (ULTRAM) 50 MG tablet Take 1 tablet (50 mg total) by mouth every 8 (eight) hours as needed. Patient not  taking: Reported on 06/07/2015 05/07/15   [name], MD   BP 163/114 mmHg  Pulse 103  Temp(Src) 97.9 F (36.6 C) (Oral)  Resp 20  SpO2 98% Physical Exam  Constitutional: He is oriented to person, place, and time. He appears well-developed and well-nourished. No distress.  HENT:  Head: Normocephalic and atraumatic.  Right Ear: External ear normal.  Left Ear: External ear normal.  Mouth/Throat: Oropharynx is clear and moist. No oropharyngeal exudate.  Eyes: EOM are normal. Pupils are equal, round, and reactive to light.  Neck: Normal range of motion. Neck supple.  Cardiovascular: Regular rhythm, normal heart sounds and intact distal pulses.  Tachycardia present.   No murmur heard. Pulmonary/Chest: Effort normal. No respiratory distress. He has wheezes (bilateral, symmetric). He has no rales.  Abdominal: Soft. He exhibits distension. There is tenderness (mild, generalized). There is no rebound and no guarding.  Musculoskeletal: He exhibits no edema.  Neurological: He is alert and oriented to person, place, and time.  Skin: Skin is warm and dry. No rash noted. He is not diaphoretic.  Vitals reviewed.   ED Course  Procedures (including critical care time) Labs Review Labs Reviewed - No data to display  Imaging Review Dg Chest Portable 1 View  06/07/2015  CLINICAL DATA:  Abdominal pain and swelling. Hepatic cirrhosis. Ascites. Faster accumulation of ascites. Difficulty breathing. EXAM: PORTABLE CHEST 1 VIEW COMPARISON:  05/06/2015 FINDINGS: The heart size and mediastinal contours are within normal limits. Both lungs are clear. Old right clavicular fracture. Suspected old bilateral rib fractures. IMPRESSION: 1. No acute cardiopulmonary findings. Electronically Signed   By: Gaylyn Rong M.D.   On: 06/07/2015 19:32   I have personally reviewed and evaluated these images and lab results as part of my medical decision-making.   EKG Interpretation None      MDM  Patient was seen  and evaluated in stable condition. Patient with chronic complaints. Patient frequents the ER for the same issue. Patient with wheezing on examination but with normal respirations and normal saturation on room air. Abdominal examination not consistent with SBP. Patient has been drained multiple times in the ER and never had SBP in the past. Patient nontoxic in appearance. He has a scheduled paracentesis tomorrow morning. Social work and case management consult said case discussed with them. We will give the patient bus passes to get to and from his appointment tomorrow. Care management will recheck out to his primary care physician's office so that way there social worker can get in touch with him for ongoing transportation issues and to help him establish medical insurance. The patient expressed understanding and agreement with this plan of care. Final diagnoses:  Ascites    1. Ascites    Leta Baptist, MD 06/09/15 [phone]

## 2015-06-09 NOTE — ED Notes (Signed)
Per [facility]- Seen and treated several days ago  For presenting complaints of ETOH present. Stomach distended and stomach pain

## 2015-06-09 NOTE — ED Notes (Signed)
MD at bedside. 

## 2015-06-09 NOTE — Discharge Instructions (Signed)
Keep your appointment for follow-up in drainage of your abdomen for tomorrow. Presented to [facility] as directed. Also follow-up with her primary care physician regarding issues with your transportation and with help with facilitating getting new insurance.  Ascites Ascites is a collection of excess fluid in the abdomen. Ascites can range from mild to severe. It can get worse without treatment. CAUSES Possible causes include:  Cirrhosis. This is the most common cause of ascites.  Infection or inflammation in the abdomen.  Cancer in the abdomen.  Heart failure.  Kidney disease.  Inflammation of the pancreas.  Clots in the veins of the liver. SIGNS AND SYMPTOMS Signs and symptoms may include:  A feeling of fullness in your abdomen. This is common.  An increase in the size of your abdomen or your waist.  Swelling in your legs.  Swelling of the scrotum in men.  Difficulty breathing.  Abdominal pain.  Sudden weight gain. If the condition is mild, you may not have symptoms. DIAGNOSIS To make a diagnosis, your health care provider will:  Ask about your medical history.  Perform a physical exam.  Order imaging tests, such as an ultrasound or CT scan of your abdomen. TREATMENT Treatment depends on the cause of the ascites. It may include:  Taking a pill to make you urinate. This is called a water pill (diuretic pill).  Strictly reducing your salt (sodium) intake. Salt can cause extra fluid to be kept in the body, and this makes ascites worse.  Having a procedure to remove fluid from your abdomen (paracentesis).  Having a procedure to transfer fluid from your abdomen into a vein.  Having a procedure that connects two of the major veins within your liver and relieves pressure on your liver (TIPS procedure). Ascites may go away or improve with treatment of the condition that caused it.  HOME CARE INSTRUCTIONS  Keep track of your weight. To do this, weigh yourself at  the same time every day and record your weight.  Keep track of how much you drink and any changes in the amount you urinate.  Follow any instructions that your health care provider gives you about how much to drink.  Try not to eat salty (high-sodium) foods.  Take medicines only as directed by your health care provider.  Keep all follow-up visits as directed by your health care provider. This is important.  Report any changes in your health to your health care provider, especially if you develop new symptoms or your symptoms get worse. SEEK MEDICAL CARE IF:  Your gain more than 3 pounds in 3 days.  Your abdominal size or your waist size increases.  You have new swelling in your legs.  The swelling in your legs gets worse. SEEK IMMEDIATE MEDICAL CARE IF:  You develop a fever.  You develop confusion.  You develop new or worsening difficulty breathing.  You develop new or worsening abdominal pain.  You develop new or worsening swelling in the scrotum (in men).   This information is not intended to replace advice given to you by your health care provider. Make sure you discuss any questions you have with your health care provider.   Document Released: 05/15/2005 Document Revised: 06/05/2014 Document Reviewed: 12/12/2013 Elsevier Interactive Patient Education [employer]2016 Elsevier Inc.

## 2015-06-10 ENCOUNTER — Encounter (HOSPITAL_BASED_OUTPATIENT_CLINIC_OR_DEPARTMENT_OTHER): Payer: Self-pay | Admitting: Clinical

## 2015-06-10 ENCOUNTER — Ambulatory Visit (HOSPITAL_COMMUNITY)
Admission: RE | Admit: 2015-06-10 | Discharge: 2015-06-10 | Disposition: A | Discharge status: Home / Self Care | Payer: Self-pay | Source: Ambulatory Visit | Attending: Family Medicine | Admitting: Family Medicine

## 2015-06-10 ENCOUNTER — Telehealth: Payer: Self-pay

## 2015-06-10 DIAGNOSIS — F1010 Alcohol abuse, uncomplicated: Secondary | ICD-10-CM | POA: Insufficient documentation

## 2015-06-10 DIAGNOSIS — K7031 Alcoholic cirrhosis of liver with ascites: Secondary | ICD-10-CM | POA: Insufficient documentation

## 2015-06-10 DIAGNOSIS — Z658 Other specified problems related to psychosocial circumstances: Secondary | ICD-10-CM

## 2015-06-10 MED ORDER — LIDOCAINE HCL (PF) 1 % IJ SOLN
INTRAMUSCULAR | Status: AC
  Filled 2015-06-10: qty 10

## 2015-06-10 MED ORDER — ALBUMIN HUMAN 25 % IV SOLN
25.0000 g | Freq: Once | INTRAVENOUS | Status: AC
  Administered 2015-06-10: 25 g via INTRAVENOUS
  Filled 2015-06-10: qty 100

## 2015-06-10 MED FILL — VENTOLIN HFA 90 MCG INHALER: 108 (90 BAS | 25 days supply | Qty: 18 | Fill #0

## 2015-06-10 MED FILL — LACTULOSE 10 GM/15 ML SOLN: 10 | 32 days supply | Qty: 946 | Fill #0

## 2015-06-10 NOTE — Procedures (Signed)
Successful US guided paracentesis from RLQ.  Yielded 8L of clear yellow fluid.  No immediate complications.  Pt tolerated well.   Specimen was not sent for labs.  Brayton ElBRUNING, Eragon Hammond PA-C 06/10/2015 10:48 AM

## 2015-06-10 NOTE — Progress Notes (Signed)
ASSESSMENT: Pt continuing to experience psychosocial stressors, and would benefit from community resources.  Stage of Change: precontemplative  PLAN: 1. F/U with behavioral health consultant in as needed 2. Psychiatric Medications: none. 3. Behavioral recommendation(s):   -continue with AA meetings -Continue to consider food resource booklets (food pantries) -F/U with financial counseling paperwork -Take home small bag of food for today -Take bus pass to get to upcoming [facility] appointment  SUBJECTIVE: Pt. referred by self for psychosocial (food, transportation):  Pt. reports the following symptoms/concerns: Pt states his primary concern is lack of transportation to his upcoming [facility] appointment, and he needs food for today, and knows his brother is unable to help with everything he needs.  Duration of problem: today Severity: mild  OBJECTIVE: Orientation & Cognition: Oriented x3. Thought processes normal and appropriate to situation. Mood: appropriate. Affect: appropriate Appearance: appropriate Risk of harm to self or others: no known risk of harm to self or others Substance use: tobacco Assessments administered: none  Diagnosis: Psychosocial stressors CPT Code: Z65.8 -------------------------------------------- Other(s) present in the room: none  Time spent with patient in exam room: 7 minutes

## 2015-06-10 NOTE — Progress Notes (Signed)
Pt arrived from IR for IV start and albumin administration via w/c and then ambulated without assist to bed.

## 2015-06-10 NOTE — Telephone Encounter (Signed)
This Case Manager received communication from Radford PaxAmy Ferrero, RN CM that patient informed her that he had difficulty getting to his appointments. She indicated bus pass given to him on 06/09/15 so he could get to his paracentesis appointment on 06/10/15. Patient has upcoming appointment on 06/14/15 at 1000 with Dr. Venetia NightAmao. Call placed to patient to determine if patient needing transportation to upcoming appointment on 06/14/15 and to also discuss meeting with Financial Counselor to determine if eligible for [employer] as [employer] recipients can receive transportation. Call placed to #[phone]. Unable to reach patient and unable to leave voicemail.

## 2015-06-12 LAB — CULTURE, BODY FLUID-BOTTLE

## 2015-06-12 LAB — CULTURE, BODY FLUID W GRAM STAIN -BOTTLE: Culture: NO GROWTH

## 2015-06-14 ENCOUNTER — Ambulatory Visit: Payer: Self-pay | Attending: Family Medicine | Admitting: Family Medicine

## 2015-06-14 ENCOUNTER — Telehealth: Payer: Self-pay | Admitting: Clinical

## 2015-06-14 ENCOUNTER — Encounter: Payer: Self-pay | Admitting: Family Medicine

## 2015-06-14 VITALS — BP 116/77 | HR 88 | Temp 97.6°F | Resp 13 | Ht 73.0 in | Wt 180.0 lb

## 2015-06-14 DIAGNOSIS — K7290 Hepatic failure, unspecified without coma: Secondary | ICD-10-CM

## 2015-06-14 DIAGNOSIS — Z6823 Body mass index (BMI) 23.0-23.9, adult: Secondary | ICD-10-CM | POA: Insufficient documentation

## 2015-06-14 DIAGNOSIS — K7460 Unspecified cirrhosis of liver: Secondary | ICD-10-CM

## 2015-06-14 DIAGNOSIS — Z131 Encounter for screening for diabetes mellitus: Secondary | ICD-10-CM

## 2015-06-14 DIAGNOSIS — R140 Abdominal distension (gaseous): Secondary | ICD-10-CM | POA: Insufficient documentation

## 2015-06-14 DIAGNOSIS — Z79899 Other long term (current) drug therapy: Secondary | ICD-10-CM | POA: Insufficient documentation

## 2015-06-14 DIAGNOSIS — K7031 Alcoholic cirrhosis of liver with ascites: Secondary | ICD-10-CM | POA: Insufficient documentation

## 2015-06-14 LAB — POCT GLYCOSYLATED HEMOGLOBIN (HGB A1C): Hemoglobin A1C: 5

## 2015-06-14 NOTE — Progress Notes (Signed)
Subjective:  Patient ID: [name], male    DOB: 26-Jun-1960  Age: 55 y.o. MRN: [id number]  CC: Follow-up   HPI [name] is a 55 year old male with a history of decompensated alcoholic liver cirrhosis status post multiple ultrasound-guided abdominal paracentesis with the last paracentesis done on 06/10/15 yielding 8 L of fluid at which time he also received albumin infusion. He denies significant pedal edema or shortness of breath but endorses abdominal distention. He has multiple scheduled future paracentesis with the next one coming up on 06/17/15. He is also wondering why he is losing weight in his arms and his back.  Outpatient Prescriptions Prior to Visit  Medication Sig Dispense Refill  . albuterol (PROVENTIL HFA;VENTOLIN HFA) 108 (90 Base) MCG/ACT inhaler Inhale 2 puffs into the lungs every 6 (six) hours as needed for wheezing or shortness of breath. 1 Inhaler 1  . folic acid (FOLVITE) 1 MG tablet Take 1 tablet (1 mg total) by mouth daily. 30 tablet 0  . furosemide (LASIX) 40 MG tablet Take 1 tablet (40 mg total) by mouth daily. 30 tablet 2  . lactulose (CHRONULAC) 10 GM/15ML solution Take 15 mLs (10 g total) by mouth 2 (two) times daily. 946 mL 2  . pantoprazole (PROTONIX) 40 MG tablet Take 1 tablet (40 mg total) by mouth daily. 30 tablet 2  . potassium chloride SA (K-DUR,KLOR-CON) 20 MEQ tablet Take 1 tablet (20 mEq total) by mouth daily. 30 tablet 2  . spironolactone (ALDACTONE) 50 MG tablet Take 1 tablet (50 mg total) by mouth daily. (Patient not taking: Reported on 06/14/2015) 30 tablet 2  . thiamine 100 MG tablet Take 1 tablet (100 mg total) by mouth daily. (Patient not taking: Reported on 06/07/2015) 30 tablet 0  . oxyCODONE (ROXICODONE) 5 MG immediate release tablet Take 1 tablet (5 mg total) by mouth every 4 (four) hours as needed for severe pain. 15 tablet 0  . traMADol (ULTRAM) 50 MG tablet Take 1 tablet (50 mg total) by mouth every 8 (eight) hours as needed. (Patient not  taking: Reported on 06/07/2015) 30 tablet 0   No facility-administered medications prior to visit.    ROS Review of Systems Constitutional: Negative for fever, chills, diaphoresis, activity change, appetite change and fatigue. HENT: Negative for ear pain, nosebleeds, congestion, facial swelling, rhinorrhea, neck pain, neck stiffness and ear discharge.  Eyes: Negative for pain, discharge, redness, itching and visual disturbance. Respiratory: Negative for cough, choking, chest tightness, shortness of breath, wheezing and stridor.  Cardiovascular: Negative for chest pain, palpitations and positive for leg swelling. Gastrointestinal: Positive for abdominal distention. Genitourinary: Negative for dysuria, urgency, frequency, hematuria, flank pain, decreased urine volume, difficulty urinating and dyspareunia.  Musculoskeletal: Negative for back pain, leg swelling Neurological: Negative for dizziness, tremors, seizures, syncope, facial asymmetry, speech difficulty, weakness, light-headedness, numbness and headaches.  Hematological: Negative for adenopathy. Does not bruise/bleed easily. Psychiatric/Behavioral: Negative for hallucinations, behavioral problems, confusion, dysphoric mood, decreased concentration and agitation.   Objective:  BP 116/77 mmHg  Pulse 88  Temp(Src) 97.6 F (36.4 C)  Resp 13  Ht 6\' 1"  (1.854 m)  Wt 180 lb (81.647 kg)  BMI 23.75 kg/m2  SpO2 97%  BP/Weight 06/14/2015 06/10/2015 06/09/2015  Systolic BP 116 109 142  Diastolic BP 77 72 102  Wt. (Lbs) 180 - -  BMI 23.75 - -  Some encounter information is confidential and restricted. Go to Review Flowsheets activity to see all data.    Lab Results  Component Value Date  HGBA1C 5.0 06/14/2015     Physical Exam Constitutional: Patient appears, chronically ill looking CVS: RRR, S1/S2 +, no murmurs, no gallops, no carotid bruit.  Pulmonary: No evidence of respiratory distress, no stridor, rhonchi, wheezes, rales.    Abdominal: Massive ascites Musculoskeletal: 1+ bilateral pedal edema Lymphadenopathy: No lymphadenopathy noted, cervical, inguinal or axillary Neuro: Alert. Normal reflexes, muscle tone coordination. No cranial nerve deficit. Skin: Skin is warm and dry. No rash noted. Not diaphoretic. No erythema. No pallor. Psychiatric: Normal mood and affect. Behavior, judgment, thought content normal  Assessment & Plan:   1. Diabetes mellitus screening A1c is normal-5.0 - HgB A1c  2. Alcoholic cirrhosis of liver with ascites (HCC) Scheduled for ultrasound-guided paracentesis on 06/17/15 He has been strongly encouraged to complete [facility] to facilitate referral to a gastroenterologist as he is not currently under the care of GI at this time. He has also been encouraged on adequate oral intake to help build his muscle mass.  3. Decompensated hepatic cirrhosis (HCC)    No orders of the defined types were placed in this encounter.    Follow-up: Return in about 1 month (around 07/15/2015) for Follow-up of hepatic cirrhosis.   [name] MD

## 2015-06-14 NOTE — Telephone Encounter (Signed)
Follow-up with [name], encouraging him to make an appointment to see financial counselors at [facility], and reassuring him that they will help him fill out the orange card and [place] discount applications; [name] states that he is thinking about coming in later this week or next, and he will call back to make appointment.

## 2015-06-14 NOTE — Progress Notes (Signed)
He reports no pain today He would like to know why he is losing weight in his arms and back He does not have anything for pain and states he is not taking his aldactone He has scheduled paracentesis on Thursday the 19th

## 2015-06-17 ENCOUNTER — Ambulatory Visit (HOSPITAL_COMMUNITY)
Admission: RE | Admit: 2015-06-17 | Discharge: 2015-06-17 | Disposition: A | Discharge status: Home / Self Care | Payer: Self-pay | Source: Ambulatory Visit | Attending: Family Medicine | Admitting: Family Medicine

## 2015-06-17 DIAGNOSIS — K7031 Alcoholic cirrhosis of liver with ascites: Secondary | ICD-10-CM | POA: Insufficient documentation

## 2015-06-17 MED ORDER — ALBUMIN HUMAN 25 % IV SOLN
25.0000 g | Freq: Once | INTRAVENOUS | Status: AC
  Administered 2015-06-17: 25 g via INTRAVENOUS
  Filled 2015-06-17: qty 100

## 2015-06-17 MED ORDER — LIDOCAINE HCL (PF) 1 % IJ SOLN
INTRAMUSCULAR | Status: AC
  Filled 2015-06-17: qty 10

## 2015-06-17 NOTE — Procedures (Signed)
   US guided RLQ paracentesis  8 liter maximum per MD Collected without complication  Post procedure IV Albumin 25 gr Per MD

## 2015-06-24 ENCOUNTER — Encounter (HOSPITAL_COMMUNITY)
Admission: RE | Admit: 2015-06-24 | Discharge: 2015-06-24 | Disposition: A | Discharge status: Home / Self Care | Payer: Self-pay | Source: Ambulatory Visit | Attending: Family Medicine | Admitting: Family Medicine

## 2015-06-24 ENCOUNTER — Ambulatory Visit (HOSPITAL_COMMUNITY)
Admission: RE | Admit: 2015-06-24 | Discharge: 2015-06-24 | Disposition: A | Discharge status: Home / Self Care | Payer: Self-pay | Source: Ambulatory Visit | Attending: Family Medicine | Admitting: Family Medicine

## 2015-06-24 DIAGNOSIS — K7031 Alcoholic cirrhosis of liver with ascites: Secondary | ICD-10-CM | POA: Insufficient documentation

## 2015-06-24 MED ORDER — LIDOCAINE HCL (PF) 1 % IJ SOLN
INTRAMUSCULAR | Status: AC
  Filled 2015-06-24: qty 10

## 2015-06-24 MED ORDER — ALBUMIN HUMAN 25 % IV SOLN
25.0000 g | Freq: Once | INTRAVENOUS | Status: AC
  Administered 2015-06-24: 25 g via INTRAVENOUS
  Filled 2015-06-24: qty 100

## 2015-06-24 NOTE — Procedures (Signed)
   US guided RLQ paracentesis  8 liters maximum removed without complication 25 gr IV Albumin post procedure per MD  Tolerated well

## 2015-06-30 ENCOUNTER — Other Ambulatory Visit: Payer: Self-pay | Admitting: Internal Medicine

## 2015-06-30 MED ORDER — ALBUTEROL SULFATE HFA 108 (90 BASE) MCG/ACT IN AERS
2.0000 | INHALATION_SPRAY | Freq: Four times a day (QID) | RESPIRATORY_TRACT | Status: DC | PRN
Start: 2015-06-30 — End: 2015-07-22

## 2015-07-01 ENCOUNTER — Ambulatory Visit (HOSPITAL_COMMUNITY)
Admission: RE | Admit: 2015-07-01 | Discharge: 2015-07-01 | Disposition: A | Discharge status: Home / Self Care | Payer: Self-pay | Source: Ambulatory Visit | Attending: Family Medicine | Admitting: Family Medicine

## 2015-07-01 DIAGNOSIS — K7031 Alcoholic cirrhosis of liver with ascites: Secondary | ICD-10-CM | POA: Insufficient documentation

## 2015-07-01 MED ORDER — ALBUMIN HUMAN 25 % IV SOLN
25.0000 g | Freq: Once | INTRAVENOUS | Status: AC
  Administered 2015-07-01: 25 g via INTRAVENOUS
  Filled 2015-07-01: qty 100

## 2015-07-01 MED ORDER — LIDOCAINE HCL (PF) 1 % IJ SOLN
INTRAMUSCULAR | Status: AC
  Filled 2015-07-01: qty 10

## 2015-07-01 NOTE — Progress Notes (Signed)
Pt received 25 gm albumin IV tolerated well.

## 2015-07-01 NOTE — Procedures (Signed)
   US guided RLQ paracentesis  8 liter maximum per MD Yellow  Post procedure IV Albumin 25 gr per MD

## 2015-07-07 ENCOUNTER — Telehealth (HOSPITAL_COMMUNITY): Payer: Self-pay

## 2015-07-07 NOTE — Telephone Encounter (Signed)
Tried to call pt to remind him of appt at [facility]. The number in the system was to a business and could not reach patient. AW

## 2015-07-08 ENCOUNTER — Ambulatory Visit (HOSPITAL_COMMUNITY)
Admission: RE | Admit: 2015-07-08 | Discharge: 2015-07-08 | Disposition: A | Discharge status: Home / Self Care | Payer: Self-pay | Source: Ambulatory Visit | Attending: Family Medicine | Admitting: Family Medicine

## 2015-07-08 DIAGNOSIS — K7031 Alcoholic cirrhosis of liver with ascites: Secondary | ICD-10-CM | POA: Insufficient documentation

## 2015-07-08 MED ORDER — LIDOCAINE HCL (PF) 1 % IJ SOLN
INTRAMUSCULAR | Status: AC
  Filled 2015-07-08: qty 10

## 2015-07-08 MED ORDER — ALBUMIN HUMAN 25 % IV SOLN
25.0000 g | Freq: Once | INTRAVENOUS | Status: AC
  Administered 2015-07-08: 25 g via INTRAVENOUS

## 2015-07-08 MED ORDER — ALBUMIN HUMAN 25 % IV SOLN
12.5000 g | Freq: Once | INTRAVENOUS | Status: DC
  Filled 2015-07-08: qty 50

## 2015-07-08 MED ORDER — ALBUMIN HUMAN 25 % IV SOLN
25.0000 g | INTRAVENOUS | Status: AC
  Filled 2015-07-08: qty 100

## 2015-07-08 NOTE — Procedures (Signed)
   US guided RLQ paracentesis  8 liter maximum per MD 25 gr IV albumin post procedure per MD  Tolerated well

## 2015-07-15 ENCOUNTER — Ambulatory Visit (HOSPITAL_COMMUNITY)
Admission: RE | Admit: 2015-07-15 | Discharge: 2015-07-15 | Disposition: A | Discharge status: Home / Self Care | Payer: Self-pay | Source: Ambulatory Visit | Attending: Family Medicine | Admitting: Family Medicine

## 2015-07-15 DIAGNOSIS — K7031 Alcoholic cirrhosis of liver with ascites: Secondary | ICD-10-CM | POA: Insufficient documentation

## 2015-07-15 MED ORDER — ALBUMIN HUMAN 25 % IV SOLN
25.0000 g | Freq: Once | INTRAVENOUS | Status: AC
  Administered 2015-07-15: 25 g via INTRAVENOUS
  Filled 2015-07-15: qty 100

## 2015-07-15 MED ORDER — LIDOCAINE HCL (PF) 1 % IJ SOLN
INTRAMUSCULAR | Status: AC
  Filled 2015-07-15: qty 10

## 2015-07-15 NOTE — Procedures (Signed)
  US guided RLQ paracentesis 7.4 liters yellow fluid  25 gr IV Albumin post procedure per MD  Tolerated well

## 2015-07-16 ENCOUNTER — Ambulatory Visit: Payer: Self-pay | Admitting: Family Medicine

## 2015-07-22 ENCOUNTER — Ambulatory Visit: Payer: Self-pay | Attending: Family Medicine | Admitting: Family Medicine

## 2015-07-22 ENCOUNTER — Encounter: Payer: Self-pay | Admitting: Family Medicine

## 2015-07-22 VITALS — BP 128/82 | HR 104 | Temp 98.1°F | Resp 15 | Ht 73.0 in | Wt 170.8 lb

## 2015-07-22 DIAGNOSIS — K7460 Unspecified cirrhosis of liver: Secondary | ICD-10-CM | POA: Insufficient documentation

## 2015-07-22 DIAGNOSIS — K7290 Hepatic failure, unspecified without coma: Secondary | ICD-10-CM

## 2015-07-22 DIAGNOSIS — K7031 Alcoholic cirrhosis of liver with ascites: Secondary | ICD-10-CM | POA: Insufficient documentation

## 2015-07-22 DIAGNOSIS — E46 Unspecified protein-calorie malnutrition: Secondary | ICD-10-CM | POA: Insufficient documentation

## 2015-07-22 DIAGNOSIS — R140 Abdominal distension (gaseous): Secondary | ICD-10-CM | POA: Insufficient documentation

## 2015-07-22 DIAGNOSIS — Z79899 Other long term (current) drug therapy: Secondary | ICD-10-CM | POA: Insufficient documentation

## 2015-07-22 DIAGNOSIS — Z6822 Body mass index (BMI) 22.0-22.9, adult: Secondary | ICD-10-CM | POA: Insufficient documentation

## 2015-07-22 LAB — COMPLETE METABOLIC PANEL WITH GFR
ALBUMIN: 2.5 g/dL — AB (ref 3.6–5.1)
ALK PHOS: 118 U/L — AB (ref 40–115)
ALT: 18 U/L (ref 9–46)
AST: 36 U/L — ABNORMAL HIGH (ref 10–35)
BILIRUBIN TOTAL: 1.4 mg/dL — AB (ref 0.2–1.2)
BUN: 7 mg/dL (ref 7–25)
CALCIUM: 7.9 mg/dL — AB (ref 8.6–10.3)
CO2: 24 mmol/L (ref 20–31)
Chloride: 92 mmol/L — ABNORMAL LOW (ref 98–110)
Creat: 0.49 mg/dL — ABNORMAL LOW (ref 0.70–1.33)
Glucose, Bld: 115 mg/dL — ABNORMAL HIGH (ref 65–99)
POTASSIUM: 3.5 mmol/L (ref 3.5–5.3)
SODIUM: 125 mmol/L — AB (ref 135–146)
Total Protein: 6.3 g/dL (ref 6.1–8.1)

## 2015-07-22 LAB — CBC WITH DIFFERENTIAL/PLATELET
BASOS ABS: 0.1 10*3/uL (ref 0.0–0.1)
Basophils Relative: 1 % (ref 0–1)
EOS PCT: 0 % (ref 0–5)
Eosinophils Absolute: 0 10*3/uL (ref 0.0–0.7)
HEMATOCRIT: 41.4 % (ref 39.0–52.0)
Hemoglobin: 14.6 g/dL (ref 13.0–17.0)
LYMPHS ABS: 1.7 10*3/uL (ref 0.7–4.0)
LYMPHS PCT: 21 % (ref 12–46)
MCH: 33.8 pg (ref 26.0–34.0)
MCHC: 35.3 g/dL (ref 30.0–36.0)
MCV: 95.8 fL (ref 78.0–100.0)
MONOS PCT: 8 % (ref 3–12)
MPV: 9.6 fL (ref 8.6–12.4)
Monocytes Absolute: 0.7 10*3/uL (ref 0.1–1.0)
Neutro Abs: 5.7 10*3/uL (ref 1.7–7.7)
Neutrophils Relative %: 70 % (ref 43–77)
Platelets: 124 10*3/uL — ABNORMAL LOW (ref 150–400)
RBC: 4.32 MIL/uL (ref 4.22–5.81)
RDW: 14.4 % (ref 11.5–15.5)
WBC: 8.2 10*3/uL (ref 4.0–10.5)

## 2015-07-22 LAB — PROTIME-INR
INR: 1.17 (ref <1.50)
Prothrombin Time: 15 seconds (ref 11.6–15.2)

## 2015-07-22 MED ORDER — LACTULOSE 10 GM/15ML PO SOLN: 10.0000 g | Freq: Two times a day (BID) | ORAL | Status: DC

## 2015-07-22 MED ORDER — SPIRONOLACTONE 50 MG PO TABS: 50.0000 mg | ORAL_TABLET | Freq: Every day | ORAL | Status: DC

## 2015-07-22 MED ORDER — POTASSIUM CHLORIDE CRYS ER 20 MEQ PO TBCR: 20.0000 meq | EXTENDED_RELEASE_TABLET | Freq: Every day | ORAL | Status: DC

## 2015-07-22 MED ORDER — ALBUTEROL SULFATE HFA 108 (90 BASE) MCG/ACT IN AERS: 2.0000 | INHALATION_SPRAY | Freq: Four times a day (QID) | RESPIRATORY_TRACT | Status: DC | PRN

## 2015-07-22 MED ORDER — PANTOPRAZOLE SODIUM 40 MG PO TBEC: 40.0000 mg | DELAYED_RELEASE_TABLET | Freq: Every day | ORAL | Status: DC

## 2015-07-22 MED FILL — PANTOPRAZOLE SOD DR 40 MG T: 40 | 30 days supply | Qty: 30 | Fill #0

## 2015-07-22 MED FILL — VENTOLIN HFA 90 MCG INHALER: 108 (90 BAS | 30 days supply | Qty: 18 | Fill #0

## 2015-07-22 MED FILL — POTASSIUM CL ER 20 MEQ TAB: 20 | 30 days supply | Qty: 30 | Fill #0

## 2015-07-22 MED FILL — LACTULOSE 10 GM/15 ML SOLN: 10 | 32 days supply | Qty: 946 | Fill #0

## 2015-07-22 MED FILL — SPIRONOLACTONE 50 MG TABLET: 50 | 30 days supply | Qty: 30 | Fill #0

## 2015-07-22 NOTE — Progress Notes (Signed)
Subjective:  Patient ID: [name], male    DOB: [birth year]  Age: 55 y.o. MRN: [id number]  CC: Follow-up   HPI [name] is a 55 year old male with a history of decompensated liver cirrhosis with massive ascites who comes into the clinic for a follow-up visit. He has had repeated weekly abdominal paracentesis with infusion of albumin with removal of 8 L of fluid each time and 7.4 at his last visit which was a week ago. He complains of abdominal distention today with associated discomfort and denies any pedal edema. He has also noticed bruising of his skin.  He has run out of all his medications and is needing refills. Denies chest pain, shortness of breath, fever.  Outpatient Prescriptions Prior to Visit  Medication Sig Dispense Refill  . folic acid (FOLVITE) 1 MG tablet Take 1 tablet (1 mg total) by mouth daily. (Patient not taking: Reported on 07/22/2015) 30 tablet 0  . furosemide (LASIX) 40 MG tablet Take 1 tablet (40 mg total) by mouth daily. (Patient not taking: Reported on 07/22/2015) 30 tablet 2  . thiamine 100 MG tablet Take 1 tablet (100 mg total) by mouth daily. (Patient not taking: Reported on 06/07/2015) 30 tablet 0  . albuterol (PROVENTIL HFA;VENTOLIN HFA) 108 (90 Base) MCG/ACT inhaler Inhale 2 puffs into the lungs every 6 (six) hours as needed for wheezing or shortness of breath. (Patient not taking: Reported on 07/22/2015) 54 g 3  . lactulose (CHRONULAC) 10 GM/15ML solution Take 15 mLs (10 g total) by mouth 2 (two) times daily. (Patient not taking: Reported on 07/22/2015) 946 mL 2  . pantoprazole (PROTONIX) 40 MG tablet Take 1 tablet (40 mg total) by mouth daily. (Patient not taking: Reported on 07/22/2015) 30 tablet 2  . potassium chloride SA (K-DUR,KLOR-CON) 20 MEQ tablet Take 1 tablet (20 mEq total) by mouth daily. (Patient not taking: Reported on 07/22/2015) 30 tablet 2  . spironolactone (ALDACTONE) 50 MG tablet Take 1 tablet (50 mg total) by mouth daily. (Patient not taking:  Reported on 06/14/2015) 30 tablet 2   No facility-administered medications prior to visit.    ROS Review of Systems Constitutional: Negative for fever, chills, diaphoresis, activity change, appetite change and fatigue. HENT: Negative for ear pain, nosebleeds, congestion, facial swelling, rhinorrhea, neck pain, neck stiffness and ear discharge.  Eyes: Negative for pain, discharge, redness, itching and visual disturbance. Respiratory: Negative for cough, choking, chest tightness, shortness of breath, wheezing and stridor.  Cardiovascular: Negative for chest pain, palpitations and leg swelling. Gastrointestinal: Positive for abdominal distention. Genitourinary: Negative for dysuria, urgency, frequency, hematuria, flank pain, decreased urine volume, difficulty urinating and dyspareunia.  Musculoskeletal: Negative for back pain, leg swelling Hematologic: Positive for bruising Neurological: Negative for dizziness, tremors, seizures, syncope, facial asymmetry, speech difficulty, weakness, light-headedness, numbness and headaches.   Objective:  BP 128/82 mmHg  Pulse 104  Temp(Src) 98.1 F (36.7 C)  Resp 15  Ht  (1.854 m)  Wt 170 lb 12.8 oz (77.474 kg)  BMI 22.54 kg/m2  SpO2 100%  BP/Weight 07/22/2015 07/15/2015 07/08/2015  Systolic BP 128 115 114  Diastolic BP 82 88 92  Wt. (Lbs) 170.8 - -  BMI 22.54 - -      Physical Exam Constitutional: Patient appears, chronically ill looking CVS: Tachycardic rate, S1/S2 +, no murmurs, no gallops, no carotid bruit.  Pulmonary: No evidence of respiratory distress, no stridor, rhonchi, wheezes, rales.  Abdominal: Massive ascites Musculoskeletal: No pedal edema Lymphadenopathy: No lymphadenopathy noted, cervical, inguinal or  axillary Neuro: Alert. Normal reflexes, muscle tone coordination. No cranial nerve deficit. Skin: Skin is dry and scaly, multiple bruises noted. Psychiatric: Normal mood and affect. Behavior, judgment, thought content  normal  CMP Latest Ref Rng 06/07/2015 05/24/2015 05/12/2015  Glucose 65 - 99 mg/dL 161(W) 960(A) 540(J)  BUN 6 - 20 mg/dL <8(J) <1(B) 6  Creatinine 0.61 - 1.24 mg/dL 1.47 8.29 5.62  Sodium 135 - 145 mmol/L 132(L) 133(L) 131(L)  Potassium 3.5 - 5.1 mmol/L 3.4(L) 3.6 3.7  Chloride 101 - 111 mmol/L 98(L) 95(L) 100(L)  CO2 22 - 32 mmol/L Calcium 8.9 - 10.3 mg/dL 8.2(L) 8.2(L) 8.2(L)  Total Protein 6.5 - 8.1 g/dL 6.2(L) 5.9(L) 5.5(L)  Total Bilirubin 0.3 - 1.2 mg/dL 1.3(Y) 1.3(H) 0.9  Alkaline Phos 38 - 126 U/L 94 85 82  AST 15 - 41 U/L ALT 17 - 63 U/L 14(L) 14(L) 14(L)    Lab Results  Component Value Date   INR 1.40 06/07/2015   INR 1.34 05/24/2015   INR 1.17 05/05/2015    Assessment & Plan:   1. Decompensated hepatic cirrhosis (HCC) He is currently not under the care of a gastroenterologist and I have expressed my concern to him. His condition is worsening with associated stigmata of chronic liver disease including bruising-prognosis is guarded He has been nonchalant with regards to obtaining the [facility] discount which would facilitate the referral to GI. - US Paracentesis; Future - US Paracentesis; Future - US Paracentesis; Future - US Paracentesis; Future - US Paracentesis; Future - US Paracentesis; Future  2. Alcoholic cirrhosis of liver with ascites (HCC) - lactulose (CHRONULAC) 10 GM/15ML solution; Take 15 mLs (10 g total) by mouth 2 (two) times daily.  Dispense: 946 mL; Refill: 2 - spironolactone (ALDACTONE) 50 MG tablet; Take 1 tablet (50 mg total) by mouth daily.  Dispense: 30 tablet; Refill: 2 - pantoprazole (PROTONIX) 40 MG tablet; Take 1 tablet (40 mg total) by mouth daily.  Dispense: 30 tablet; Refill: 2 - COMPLETE METABOLIC PANEL WITH GFR - CBC with Differential/Platelet - Protime-INR - US Paracentesis; Future - US Paracentesis; Future - US Paracentesis; Future - US Paracentesis; Future - US Paracentesis; Future - US Paracentesis;  Future  3. Protein-calorie malnutrition (HCC) Advised to increase oral intake as the patient is losing weight.   Meds ordered this encounter  Medications  . lactulose (CHRONULAC) 10 GM/15ML solution    Sig: Take 15 mLs (10 g total) by mouth 2 (two) times daily.    Dispense:  946 mL    Refill:  2  . potassium chloride SA (K-DUR,KLOR-CON) 20 MEQ tablet    Sig: Take 1 tablet (20 mEq total) by mouth daily.    Dispense:  30 tablet    Refill:  2  . spironolactone (ALDACTONE) 50 MG tablet    Sig: Take 1 tablet (50 mg total) by mouth daily.    Dispense:  30 tablet    Refill:  2  . pantoprazole (PROTONIX) 40 MG tablet    Sig: Take 1 tablet (40 mg total) by mouth daily.    Dispense:  30 tablet    Refill:  2  . albuterol (PROVENTIL HFA;VENTOLIN HFA) 108 (90 Base) MCG/ACT inhaler    Sig: Inhale 2 puffs into the lungs every 6 (six) hours as needed for wheezing or shortness of breath.    Dispense:  54 g    Refill:  3    Follow-up: 6 weeks  Consuello Lassalle  Jarold Song MD

## 2015-07-22 NOTE — Patient Instructions (Signed)
Cirrhosis °Cirrhosis is long-term (chronic) liver injury. The liver is your largest internal organ, and it performs many functions. The liver converts food into energy, removes toxic material from your blood, makes important proteins, and absorbs necessary vitamins from your diet. °If you have cirrhosis, it means many of your healthy liver cells have been replaced by scar tissue. This prevents blood from flowing through your liver, which makes it difficult for your liver to function. This scarring is not reversible, but treatment can prevent it from getting worse.  °CAUSES  °Hepatitis C and long-term alcohol abuse are the most common causes of cirrhosis. Other causes include: °· Nonalcoholic fatty liver disease. °· Hepatitis B infection. °· Autoimmune hepatitis. °· Diseases that cause blockage of ducts inside the liver. °· Inherited liver diseases. °· Reactions to certain long-term medicines. °· Parasitic infections. °· Long-term exposure to certain toxins. °RISK FACTORS °You may have a higher risk of cirrhosis if you: °· Have certain hepatitis viruses. °· Abuse alcohol, especially if you are male. °· Are overweight. °· Share needles. °· Have unprotected sex with someone who has hepatitis. °SYMPTOMS  °You may not have any signs and symptoms at first. Symptoms may not develop until the damage to your liver starts to get worse. Signs and symptoms of cirrhosis may include:  °· Tenderness in the right-upper part of your abdomen. °· Weakness and tiredness (fatigue). °· Loss of appetite. °· Nausea. °· Weight loss and muscle loss. °· Itchiness. °· Yellow skin and eyes (jaundice). °· Buildup of fluid in the abdomen (ascites). °· Swelling of the feet and ankles (edema). °· Appearance of tiny blood vessels under the skin. °· Mental confusion. °· Easy bruising and bleeding. °DIAGNOSIS  °Your health care provider may suspect cirrhosis based on your symptoms and medical history, especially if you have other medical conditions  or a history of alcohol abuse. Your health care provider will do a physical exam to feel your liver and check for signs of cirrhosis. Your health care provider may perform other tests, including:  °· Blood tests to check:   °¨ Whether you have hepatitis B or C.   °¨ Kidney function. °¨ Liver function. °· Imaging tests such as: °¨ MRI or CT scan to look for changes seen in advanced cirrhosis. °¨ Ultrasound to see if normal liver tissue is being replaced by scar tissue. °· A procedure using a long needle to take a sample of liver tissue (biopsy) for examination under a microscope. Liver biopsy can confirm the diagnosis of cirrhosis.   °TREATMENT  °Treatment depends on how damaged your liver is and what caused the damage. Treatment may include treating cirrhosis symptoms or treating the underlying causes of the condition to try to slow the progression of the damage. Treatment may include: °· Making lifestyle changes, such as:   °¨ Eating a healthy diet. °¨ Restricting salt intake.  °¨ Maintaining a healthy weight.   °¨ Not abusing drugs or alcohol. °· Taking medicines to: °¨ Treat liver infections or other infections. °¨ Control itching. °¨ Reduce fluid buildup. °¨ Reduce certain blood toxins. °¨ Reduce risk of bleeding from enlarged blood vessels in the stomach or esophagus (varices). °· If varices are causing bleeding problems, you may need treatment with a procedure that ties up the vessels causing them to fall off (band ligation). °· If cirrhosis is causing your liver to fail, your health care provider may recommend a liver transplant. °· Other treatments may be recommended depending on any complications of cirrhosis, such as liver-related kidney failure (hepatorenal   syndrome). °HOME CARE INSTRUCTIONS  °· Take medicines only as directed by your health care provider. Do not use drugs that are toxic to your liver. Ask your health care provider before taking any new medicines, including over-the-counter medicines.    °· Rest as needed. °· Eat a well-balanced diet. Ask your health care provider or dietitian for more information.   °· You may have to follow a low-salt diet or restrict your water intake as directed. °· Do not drink alcohol. This is especially important if you are taking acetaminophen. °· Keep all follow-up visits as directed by your health care provider. This is important. °SEEK MEDICAL CARE IF: °· You have fatigue or weakness that is getting worse. °· You develop swelling of the hands, feet, legs, or face. °· You have a fever. °· You develop loss of appetite. °· You have nausea or vomiting. °· You develop jaundice. °· You develop easy bruising or bleeding. °SEEK IMMEDIATE MEDICAL CARE IF: °· You vomit bright red blood or a material that looks like coffee grounds. °· You have blood in your stools. °· Your stools appear black and tarry. °· You become confused. °· You have chest pain or trouble breathing. °  °This information is not intended to replace advice given to you by your health care provider. Make sure you discuss any questions you have with your health care provider. °  °Document Released: 05/15/2005 Document Revised: 06/05/2014 Document Reviewed: 01/21/2014 °Elsevier Interactive Patient Education ©2016 [employer] ° °

## 2015-07-22 NOTE — Progress Notes (Signed)
Patient states he tried to go to have his paracentisis done this am but they told him he had no more orders He has been out of medications since last Tuesday

## 2015-07-26 ENCOUNTER — Telehealth: Payer: Self-pay | Admitting: *Deleted

## 2015-07-26 ENCOUNTER — Ambulatory Visit (HOSPITAL_COMMUNITY)
Admission: RE | Admit: 2015-07-26 | Discharge: 2015-07-26 | Disposition: A | Discharge status: Home / Self Care | Payer: Self-pay | Source: Ambulatory Visit | Attending: Family Medicine | Admitting: Family Medicine

## 2015-07-26 DIAGNOSIS — K7031 Alcoholic cirrhosis of liver with ascites: Secondary | ICD-10-CM | POA: Insufficient documentation

## 2015-07-26 DIAGNOSIS — K7290 Hepatic failure, unspecified without coma: Secondary | ICD-10-CM | POA: Insufficient documentation

## 2015-07-26 MED ORDER — LIDOCAINE HCL (PF) 1 % IJ SOLN
INTRAMUSCULAR | Status: AC
  Filled 2015-07-26: qty 10

## 2015-07-26 MED ORDER — ALBUMIN HUMAN 25 % IV SOLN
25.0000 g | Freq: Once | INTRAVENOUS | Status: AC
  Administered 2015-07-26: 25 g via INTRAVENOUS
  Filled 2015-07-26 (×2): qty 100

## 2015-07-26 NOTE — Telephone Encounter (Signed)
Attempted to contact patient.  His phone rang 15 times with no answer or voicemail.

## 2015-07-26 NOTE — Telephone Encounter (Signed)
-----   Message from [name], MD sent at 07/26/2015  8:49 AM EST ----- Abnormal labs due to liver cirhosis

## 2015-07-26 NOTE — Procedures (Signed)
Ultrasound-guided therapeutic paracentesis performed yielding 7 liters of clear yellow colored fluid. No immediate complications.  25g of albumin to be given in short stay before discharge.  Jona Erkkila E 11:59 AM 07/26/2015

## 2015-07-27 ENCOUNTER — Telehealth: Payer: Self-pay | Admitting: *Deleted

## 2015-07-27 NOTE — Telephone Encounter (Signed)
-----   Message from [name] [name], MD sent at 07/26/2015  8:49 AM EST ----- Abnormal labs due to liver cirhosis 

## 2015-07-27 NOTE — Telephone Encounter (Signed)
Mailed letter to patient after trying again with no success to reach by phone

## 2015-08-02 ENCOUNTER — Ambulatory Visit (HOSPITAL_COMMUNITY)
Admission: RE | Admit: 2015-08-02 | Discharge: 2015-08-02 | Disposition: A | Discharge status: Home / Self Care | Payer: Self-pay | Source: Ambulatory Visit | Attending: Family Medicine | Admitting: Family Medicine

## 2015-08-02 DIAGNOSIS — K7031 Alcoholic cirrhosis of liver with ascites: Secondary | ICD-10-CM | POA: Insufficient documentation

## 2015-08-02 DIAGNOSIS — K7290 Hepatic failure, unspecified without coma: Secondary | ICD-10-CM | POA: Insufficient documentation

## 2015-08-02 DIAGNOSIS — K7460 Unspecified cirrhosis of liver: Secondary | ICD-10-CM

## 2015-08-02 MED ORDER — LIDOCAINE HCL (PF) 1 % IJ SOLN
INTRAMUSCULAR | Status: AC
  Filled 2015-08-02: qty 10

## 2015-08-02 MED ORDER — ALBUMIN HUMAN 25 % IV SOLN
25.0000 g | Freq: Once | INTRAVENOUS | Status: AC
  Administered 2015-08-02: 25 g via INTRAVENOUS
  Filled 2015-08-02: qty 100

## 2015-08-02 NOTE — Procedures (Signed)
Ultrasound-guided  therapeutic paracentesis performed yielding 7.2 liters of yellow colored fluid. No immediate complications.  25G of IV albumin were received in short stay following the procedure.  Danaly Bari E 12:27 PM 08/02/2015

## 2015-08-09 ENCOUNTER — Ambulatory Visit (HOSPITAL_COMMUNITY)
Admission: RE | Admit: 2015-08-09 | Discharge: 2015-08-09 | Disposition: A | Discharge status: Home / Self Care | Payer: Self-pay | Source: Ambulatory Visit | Attending: Family Medicine | Admitting: Family Medicine

## 2015-08-09 DIAGNOSIS — K7031 Alcoholic cirrhosis of liver with ascites: Secondary | ICD-10-CM | POA: Insufficient documentation

## 2015-08-09 DIAGNOSIS — K7290 Hepatic failure, unspecified without coma: Secondary | ICD-10-CM | POA: Insufficient documentation

## 2015-08-09 DIAGNOSIS — K7460 Unspecified cirrhosis of liver: Secondary | ICD-10-CM

## 2015-08-09 MED ORDER — ALBUMIN HUMAN 25 % IV SOLN
25.0000 g | Freq: Once | INTRAVENOUS | Status: AC
Start: 2015-08-09 — End: 2015-08-09
  Administered 2015-08-09: 25 g via INTRAVENOUS
  Filled 2015-08-09: qty 100

## 2015-08-09 MED ORDER — LIDOCAINE HCL (PF) 1 % IJ SOLN
INTRAMUSCULAR | Status: AC
  Filled 2015-08-09: qty 10

## 2015-08-09 NOTE — Procedures (Signed)
Ultrasound-guided therapeutic paracentesis performed yielding 7 liters of clear yellow colored fluid. No immediate complications.  [name] E 12:30 PM 08/09/2015

## 2015-08-16 ENCOUNTER — Ambulatory Visit (HOSPITAL_COMMUNITY)
Admission: RE | Admit: 2015-08-16 | Discharge: 2015-08-16 | Disposition: A | Discharge status: Home / Self Care | Payer: Self-pay | Source: Ambulatory Visit | Attending: Family Medicine | Admitting: Family Medicine

## 2015-08-16 DIAGNOSIS — K7031 Alcoholic cirrhosis of liver with ascites: Secondary | ICD-10-CM | POA: Insufficient documentation

## 2015-08-16 DIAGNOSIS — K7460 Unspecified cirrhosis of liver: Secondary | ICD-10-CM

## 2015-08-16 DIAGNOSIS — K7290 Hepatic failure, unspecified without coma: Secondary | ICD-10-CM

## 2015-08-16 DIAGNOSIS — F1010 Alcohol abuse, uncomplicated: Secondary | ICD-10-CM | POA: Insufficient documentation

## 2015-08-16 MED ORDER — LIDOCAINE HCL (PF) 1 % IJ SOLN
INTRAMUSCULAR | Status: AC
  Filled 2015-08-16: qty 10

## 2015-08-16 MED ORDER — ALBUMIN HUMAN 25 % IV SOLN
25.0000 g | Freq: Once | INTRAVENOUS | Status: AC
  Administered 2015-08-16: 25 g via INTRAVENOUS
  Filled 2015-08-16 (×2): qty 100

## 2015-08-16 NOTE — Procedures (Signed)
Ultrasound-guided therapeutic paracentesis performed yielding 7 liters of clear yellow colored fluid. No immediate complications.  Received 25g of albumin in short stay post procedure  [name] E 12:34 PM 08/16/2015

## 2015-08-17 NOTE — Telephone Encounter (Signed)
error 

## 2015-08-23 ENCOUNTER — Ambulatory Visit (HOSPITAL_COMMUNITY)
Admission: RE | Admit: 2015-08-23 | Discharge: 2015-08-23 | Disposition: A | Discharge status: Home / Self Care | Payer: Self-pay | Source: Ambulatory Visit | Attending: Family Medicine | Admitting: Family Medicine

## 2015-08-23 DIAGNOSIS — K7031 Alcoholic cirrhosis of liver with ascites: Secondary | ICD-10-CM | POA: Insufficient documentation

## 2015-08-23 DIAGNOSIS — K7460 Unspecified cirrhosis of liver: Secondary | ICD-10-CM

## 2015-08-23 DIAGNOSIS — K7290 Hepatic failure, unspecified without coma: Secondary | ICD-10-CM | POA: Insufficient documentation

## 2015-08-23 MED ORDER — LIDOCAINE HCL (PF) 1 % IJ SOLN
INTRAMUSCULAR | Status: AC
  Filled 2015-08-23: qty 10

## 2015-08-23 MED ORDER — ALBUMIN HUMAN 25 % IV SOLN
25.0000 g | Freq: Once | INTRAVENOUS | Status: AC
Start: 2015-08-23 — End: 2015-08-23
  Administered 2015-08-23: 25 g via INTRAVENOUS

## 2015-08-23 NOTE — Procedures (Signed)
Ultrasound-guided therapeutic paracentesis performed yielding 7 liters of yellow colored fluid. No immediate complications.  He received 25g of albumin in short stay following the procedure  [name] E 12:57 PM 08/23/2015

## 2015-08-30 ENCOUNTER — Ambulatory Visit (HOSPITAL_COMMUNITY): Payer: Self-pay

## 2015-09-03 ENCOUNTER — Telehealth: Payer: Self-pay | Admitting: Clinical

## 2015-09-03 NOTE — Telephone Encounter (Signed)
Attempt to f/u with pt, phone rang numerous times to room number provided by pt, then went back to front desk. When front desk answered, given HIPPA-compliant response that "I'm attempting to reach [name] in room 430", staff replied, "oh, well they checked out already". No message left.

## 2015-09-05 ENCOUNTER — Encounter (HOSPITAL_COMMUNITY): Payer: Self-pay

## 2015-09-05 ENCOUNTER — Observation Stay (HOSPITAL_COMMUNITY): Payer: Medicaid Other

## 2015-09-05 ENCOUNTER — Inpatient Hospital Stay (HOSPITAL_COMMUNITY)
Admission: EM | Admit: 2015-09-05 | Discharge: 2015-09-10 | DRG: 432 | Disposition: A | Discharge status: Home / Self Care | Payer: Medicaid Other | Attending: Family Medicine | Admitting: Family Medicine

## 2015-09-05 DIAGNOSIS — F17210 Nicotine dependence, cigarettes, uncomplicated: Secondary | ICD-10-CM | POA: Diagnosis present

## 2015-09-05 DIAGNOSIS — Z8719 Personal history of other diseases of the digestive system: Secondary | ICD-10-CM | POA: Diagnosis not present

## 2015-09-05 DIAGNOSIS — R0602 Shortness of breath: Secondary | ICD-10-CM | POA: Diagnosis present

## 2015-09-05 DIAGNOSIS — E860 Dehydration: Secondary | ICD-10-CM | POA: Diagnosis present

## 2015-09-05 DIAGNOSIS — K652 Spontaneous bacterial peritonitis: Secondary | ICD-10-CM | POA: Diagnosis present

## 2015-09-05 DIAGNOSIS — R64 Cachexia: Secondary | ICD-10-CM | POA: Diagnosis present

## 2015-09-05 DIAGNOSIS — E46 Unspecified protein-calorie malnutrition: Secondary | ICD-10-CM | POA: Diagnosis present

## 2015-09-05 DIAGNOSIS — E8809 Other disorders of plasma-protein metabolism, not elsewhere classified: Secondary | ICD-10-CM | POA: Diagnosis present

## 2015-09-05 DIAGNOSIS — Z6822 Body mass index (BMI) 22.0-22.9, adult: Secondary | ICD-10-CM

## 2015-09-05 DIAGNOSIS — E878 Other disorders of electrolyte and fluid balance, not elsewhere classified: Secondary | ICD-10-CM | POA: Diagnosis present

## 2015-09-05 DIAGNOSIS — I9581 Postprocedural hypotension: Secondary | ICD-10-CM | POA: Diagnosis not present

## 2015-09-05 DIAGNOSIS — J9610 Chronic respiratory failure, unspecified whether with hypoxia or hypercapnia: Secondary | ICD-10-CM | POA: Diagnosis present

## 2015-09-05 DIAGNOSIS — Z79899 Other long term (current) drug therapy: Secondary | ICD-10-CM | POA: Diagnosis not present

## 2015-09-05 DIAGNOSIS — K7031 Alcoholic cirrhosis of liver with ascites: Secondary | ICD-10-CM | POA: Diagnosis present

## 2015-09-05 DIAGNOSIS — E871 Hypo-osmolality and hyponatremia: Secondary | ICD-10-CM | POA: Diagnosis present

## 2015-09-05 DIAGNOSIS — Z590 Homelessness: Secondary | ICD-10-CM

## 2015-09-05 DIAGNOSIS — F1010 Alcohol abuse, uncomplicated: Secondary | ICD-10-CM | POA: Diagnosis present

## 2015-09-05 DIAGNOSIS — M545 Low back pain: Secondary | ICD-10-CM | POA: Diagnosis not present

## 2015-09-05 DIAGNOSIS — R188 Other ascites: Secondary | ICD-10-CM | POA: Diagnosis present

## 2015-09-05 DIAGNOSIS — K219 Gastro-esophageal reflux disease without esophagitis: Secondary | ICD-10-CM | POA: Diagnosis present

## 2015-09-05 DIAGNOSIS — E8770 Fluid overload, unspecified: Secondary | ICD-10-CM | POA: Diagnosis not present

## 2015-09-05 DIAGNOSIS — J449 Chronic obstructive pulmonary disease, unspecified: Secondary | ICD-10-CM | POA: Diagnosis present

## 2015-09-05 DIAGNOSIS — R600 Localized edema: Secondary | ICD-10-CM | POA: Diagnosis not present

## 2015-09-05 DIAGNOSIS — Z9119 Patient's noncompliance with other medical treatment and regimen: Secondary | ICD-10-CM | POA: Diagnosis not present

## 2015-09-05 DIAGNOSIS — R1084 Generalized abdominal pain: Secondary | ICD-10-CM

## 2015-09-05 DIAGNOSIS — R531 Weakness: Secondary | ICD-10-CM | POA: Diagnosis present

## 2015-09-05 DIAGNOSIS — D696 Thrombocytopenia, unspecified: Secondary | ICD-10-CM | POA: Diagnosis present

## 2015-09-05 LAB — APTT: APTT: 38 s — AB (ref 24–37)

## 2015-09-05 LAB — COMPREHENSIVE METABOLIC PANEL
ALT: 38 U/L (ref 17–63)
ANION GAP: 13 (ref 5–15)
AST: 105 U/L — ABNORMAL HIGH (ref 15–41)
Albumin: 2 g/dL — ABNORMAL LOW (ref 3.5–5.0)
Alkaline Phosphatase: 99 U/L (ref 38–126)
BILIRUBIN TOTAL: 3.5 mg/dL — AB (ref 0.3–1.2)
BUN: 16 mg/dL (ref 6–20)
CHLORIDE: 85 mmol/L — AB (ref 101–111)
CO2: 20 mmol/L — ABNORMAL LOW (ref 22–32)
Calcium: 7.8 mg/dL — ABNORMAL LOW (ref 8.9–10.3)
Creatinine, Ser: 1.05 mg/dL (ref 0.61–1.24)
GFR calc Af Amer: >60 mL/min (ref >60)
Glucose, Bld: 81 mg/dL (ref 65–99)
POTASSIUM: 4.1 mmol/L (ref 3.5–5.1)
Sodium: 118 mmol/L — CL (ref 135–145)
TOTAL PROTEIN: 5.5 g/dL — AB (ref 6.5–8.1)

## 2015-09-05 LAB — CBC WITH DIFFERENTIAL/PLATELET
BASOS ABS: 0 10*3/uL (ref 0.0–0.1)
BASOS PCT: 0 %
EOS PCT: 0 %
Eosinophils Absolute: 0 10*3/uL (ref 0.0–0.7)
HEMATOCRIT: 37.1 % — AB (ref 39.0–52.0)
Hemoglobin: 13.5 g/dL (ref 13.0–17.0)
LYMPHS PCT: 8 %
Lymphs Abs: 1.6 10*3/uL (ref 0.7–4.0)
MCH: 34.2 pg — ABNORMAL HIGH (ref 26.0–34.0)
MCHC: 36.4 g/dL — AB (ref 30.0–36.0)
MCV: 93.9 fL (ref 78.0–100.0)
Monocytes Absolute: 2.2 10*3/uL — ABNORMAL HIGH (ref 0.1–1.0)
Monocytes Relative: 11 %
NEUTROS ABS: 16.5 10*3/uL — AB (ref 1.7–7.7)
Neutrophils Relative %: 81 %
PLATELETS: 82 10*3/uL — AB (ref 150–400)
RBC: 3.95 MIL/uL — AB (ref 4.22–5.81)
RDW: 13.6 % (ref 11.5–15.5)
WBC: 20.4 10*3/uL — AB (ref 4.0–10.5)

## 2015-09-05 LAB — AMMONIA: AMMONIA: 64 umol/L — AB (ref 9–35)

## 2015-09-05 LAB — PROTIME-INR
INR: 1.45 (ref 0.00–1.49)
PROTHROMBIN TIME: 17.7 s — AB (ref 11.6–15.2)

## 2015-09-05 MED ORDER — CEFTRIAXONE SODIUM 2 G IJ SOLR
2.0000 g | INTRAMUSCULAR | Status: DC
Start: 2015-09-05 — End: 2015-09-10
  Administered 2015-09-06 – 2015-09-09 (×5): 2 g via INTRAVENOUS
  Filled 2015-09-05 (×6): qty 2

## 2015-09-05 MED ORDER — LACTULOSE 10 GM/15ML PO SOLN: 10.0000 g | Freq: Two times a day (BID) | ORAL | Status: DC

## 2015-09-05 MED ORDER — ONDANSETRON HCL 4 MG/2ML IJ SOLN
4.0000 mg | Freq: Four times a day (QID) | INTRAMUSCULAR | Status: DC | PRN
  Administered 2015-09-06 (×2): 4 mg via INTRAVENOUS
  Filled 2015-09-05 (×2): qty 2

## 2015-09-05 MED ORDER — VITAMIN B-1 100 MG PO TABS
100.0000 mg | ORAL_TABLET | Freq: Every day | ORAL | Status: DC
Start: 2015-09-06 — End: 2015-09-10
  Administered 2015-09-06 – 2015-09-10 (×5): 100 mg via ORAL
  Filled 2015-09-05 (×5): qty 1

## 2015-09-05 MED ORDER — IPRATROPIUM-ALBUTEROL 0.5-2.5 (3) MG/3ML IN SOLN
3.0000 mL | Freq: Two times a day (BID) | RESPIRATORY_TRACT | Status: DC
  Administered 2015-09-06 – 2015-09-10 (×9): 3 mL via RESPIRATORY_TRACT
  Filled 2015-09-05 (×10): qty 3

## 2015-09-05 MED ORDER — OXYCODONE HCL 5 MG PO TABS
5.0000 mg | ORAL_TABLET | ORAL | Status: DC | PRN
  Administered 2015-09-06 – 2015-09-08 (×6): 5 mg via ORAL
  Filled 2015-09-05 (×6): qty 1

## 2015-09-05 MED ORDER — ZOLPIDEM TARTRATE 5 MG PO TABS: 5.0000 mg | ORAL_TABLET | Freq: Every evening | ORAL | Status: DC | PRN

## 2015-09-05 MED ORDER — LORAZEPAM 1 MG PO TABS
1.0000 mg | ORAL_TABLET | Freq: Four times a day (QID) | ORAL | Status: AC | PRN
  Filled 2015-09-05: qty 1

## 2015-09-05 MED ORDER — PANTOPRAZOLE SODIUM 40 MG PO TBEC: 40.0000 mg | DELAYED_RELEASE_TABLET | Freq: Every day | ORAL | Status: DC

## 2015-09-05 MED ORDER — SPIRONOLACTONE 50 MG PO TABS
50.0000 mg | ORAL_TABLET | Freq: Every day | ORAL | Status: DC
  Administered 2015-09-06 – 2015-09-10 (×5): 50 mg via ORAL
  Filled 2015-09-05 (×5): qty 1

## 2015-09-05 MED ORDER — ALBUTEROL SULFATE (2.5 MG/3ML) 0.083% IN NEBU: 2.5000 mg | INHALATION_SOLUTION | Freq: Four times a day (QID) | RESPIRATORY_TRACT | Status: DC | PRN

## 2015-09-05 MED ORDER — LORAZEPAM 2 MG/ML IJ SOLN: 1.0000 mg | Freq: Four times a day (QID) | INTRAMUSCULAR | Status: AC | PRN

## 2015-09-05 MED ORDER — IPRATROPIUM-ALBUTEROL 0.5-2.5 (3) MG/3ML IN SOLN
3.0000 mL | Freq: Four times a day (QID) | RESPIRATORY_TRACT | Status: DC
  Administered 2015-09-05: 3 mL via RESPIRATORY_TRACT

## 2015-09-05 MED ORDER — FUROSEMIDE 40 MG PO TABS: 40.0000 mg | ORAL_TABLET | Freq: Every day | ORAL | Status: DC

## 2015-09-05 MED ORDER — PANTOPRAZOLE SODIUM 40 MG IV SOLR
40.0000 mg | INTRAVENOUS | Status: DC
  Administered 2015-09-06 – 2015-09-08 (×3): 40 mg via INTRAVENOUS
  Filled 2015-09-05 (×3): qty 40

## 2015-09-05 MED ORDER — MORPHINE SULFATE (PF) 2 MG/ML IV SOLN
2.0000 mg | INTRAVENOUS | Status: DC | PRN
  Administered 2015-09-06 – 2015-09-09 (×2): 2 mg via INTRAVENOUS
  Filled 2015-09-05 (×2): qty 1

## 2015-09-05 MED ORDER — ONDANSETRON HCL 4 MG PO TABS: 4.0000 mg | ORAL_TABLET | Freq: Four times a day (QID) | ORAL | Status: DC | PRN

## 2015-09-05 MED ORDER — FOLIC ACID 1 MG PO TABS
1.0000 mg | ORAL_TABLET | Freq: Every day | ORAL | Status: DC
  Administered 2015-09-06 – 2015-09-10 (×5): 1 mg via ORAL
  Filled 2015-09-05 (×5): qty 1

## 2015-09-05 MED ORDER — POTASSIUM CHLORIDE CRYS ER 20 MEQ PO TBCR
20.0000 meq | EXTENDED_RELEASE_TABLET | Freq: Every day | ORAL | Status: DC
  Administered 2015-09-06 – 2015-09-10 (×5): 20 meq via ORAL
  Filled 2015-09-05 (×5): qty 1

## 2015-09-05 MED ORDER — FUROSEMIDE 10 MG/ML IJ SOLN
40.0000 mg | Freq: Every day | INTRAMUSCULAR | Status: DC
  Administered 2015-09-06: 40 mg via INTRAVENOUS
  Filled 2015-09-05: qty 4

## 2015-09-05 MED ORDER — BUDESONIDE 0.5 MG/2ML IN SUSP
0.2500 mg | Freq: Two times a day (BID) | RESPIRATORY_TRACT | Status: DC
  Administered 2015-09-06 – 2015-09-07 (×4): 0.25 mg via RESPIRATORY_TRACT
  Filled 2015-09-05 (×7): qty 2

## 2015-09-05 MED ORDER — NICOTINE 14 MG/24HR TD PT24
14.0000 mg | MEDICATED_PATCH | Freq: Every day | TRANSDERMAL | Status: DC
  Administered 2015-09-06 – 2015-09-10 (×6): 14 mg via TRANSDERMAL
  Filled 2015-09-05 (×6): qty 1

## 2015-09-05 MED ORDER — ADULT MULTIVITAMIN W/MINERALS CH
1.0000 | ORAL_TABLET | Freq: Every day | ORAL | Status: DC
  Administered 2015-09-06 – 2015-09-10 (×5): 1 via ORAL
  Filled 2015-09-05 (×5): qty 1

## 2015-09-05 MED ORDER — LACTULOSE 10 GM/15ML PO SOLN
10.0000 g | Freq: Three times a day (TID) | ORAL | Status: DC
  Administered 2015-09-06 – 2015-09-10 (×13): 10 g via ORAL
  Filled 2015-09-05 (×13): qty 15

## 2015-09-05 MED ORDER — ALBUTEROL SULFATE HFA 108 (90 BASE) MCG/ACT IN AERS: 2.0000 | INHALATION_SPRAY | RESPIRATORY_TRACT | Status: DC | PRN

## 2015-09-05 NOTE — ED Notes (Addendum)
Patient transported to X-ray 

## 2015-09-05 NOTE — ED Notes (Signed)
Pt. Here via [facility] for shortness of breath. EMS notes rhonchi in all fields with some wheezing. Pt. 91% on room air. Pt. Hx of COPD and cirrhosis. EMS reports 12-lead unremarkable. EMS gave 250 mL NS en route because pt. Systolic 88 mmHG. Pt. Also reports he has not eaten in 4 days and has continued to fall. Pt. Noted to have extended and taunt abdomen.

## 2015-09-05 NOTE — H&P (Signed)
[facility] History and Physical    Bryce MawRobert [name] ZOX:[id number] DOB: 10-Nov-1960 DOA: 09/05/2015  Referring physician:  PCP: Jaclyn ShaggyEnobong, [name], MD   Chief Complaint: abdominal pain and distension   HPI: Bryce MawRobert Weller is a 55 y.o. 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 Denied fever, chills or sweating. York SpanielSaid he has routine paracentesis and missed his appointment 7 days ago on 08/30/15. Patient said at that time also ran out of his diuretics as well. Normally on Lasix 40 daily and aldactone 50 daily. Since has gained weight but could not quantify how much. Patient also on Lactulose daily, said he's been having frequent bowel movements, loose brown sans blood about once per hour. Patient also complains of shortness of breath worse since abdominal swelling. Associated with wheezing and cough with sputum productive of white phlegm. Denied hemoptysis. Patient ran out of his albuterol inhaler. Patient last admitted in November, seen by palliative care.   In the ER he had CBC significant for WBC of 20, increased neutrophils at 81%, low PLT at 82 without evidence of bleeding/bruising. Metabolic panel significant for hyponatremia at 118, hypochloremia at 85. Calcium decreased at 7.8. Ionized calicum and coagulation studies as well as CXR pending. Because of worsening ascites, hospitalist called to admit.     CODE STATUS: Full    Review of Systems:  Constitutional: genearlized malaise   No weight loss, night sweats, Fevers, chills, fatigue.  HEENT: denied headache, reported occasional blurry vision No headaches, Difficulty swallowing,Tooth/dental problems,Sore throat, Cardio-vascular: No chest pain,  No chest pain,  Orthopnea, PND, swelling in lower extremities, anasarca, dizziness, palpitations  GI: diffuse abd pain, swelling, tenderness. No heartburn, indigestion, abdominal pain, nausea, vomiting, diarrhea, change in bowel habits, loss of appetite  Resp: wheeze, short of breath  No shortness of breath with exertion or at rest. No excess mucus, no productive cough, No non-productive cough, No coughing up of blood.No change in color of mucus.No wheezing.No chest wall deformity  Skin: no rash no rash or lesions.  GU: denied dysuria no dysuria, change in color of urine, no urgency or frequency. No flank pain.  Musculoskeletal:  Denied muscle tenderness No joint pain or swelling. No decreased range of motion. No back pain.  Psych: not homicidal or suicidal No change in mood or affect. No depression or anxiety. No memory loss.  Neuro: No focal neurologic deficits No change in sensation, unilateral strength, or cognitive abilities  All other systems were reviewed and are negative.  Past Medical History  Diagnosis Date  . ETOH abuse   . Cirrhosis of liver (HCC)   . COPD (chronic obstructive pulmonary disease) [facility](HCC)    Past Surgical History  Procedure Laterality Date  . No past surgeries      jaw surgery 5872yrs ago   Social History:  reports that he has been smoking Cigarettes.  He has a 20 pack-year smoking history. He has never used smokeless tobacco. He reports that he does not drink alcohol or use illicit drugs.  No Known Allergies  Family History  Problem Relation Age of Onset  . Diabetes Mother      Prior to Admission medications   Medication Sig Start Date End Date Taking? Authorizing Provider  albuterol (PROVENTIL HFA;VENTOLIN HFA) 108 (90 Base) MCG/ACT  inhaler Inhale 2 puffs into the lungs every 6 (six) hours as needed for wheezing or shortness of breath. 07/22/15   [name], MD  folic acid (FOLVITE) 1 MG tablet Take 1 tablet (1 mg total) by mouth daily. Patient not taking: Reported  on 07/22/2015 04/15/15   Elease Etienne, MD  furosemide (LASIX) 40 MG tablet Take 1 tablet (40 mg total) by mouth daily. Patient not taking: Reported on 07/22/2015 05/28/15   [name], MD  lactulose (CHRONULAC) 10 GM/15ML solution Take 15 mLs (10 g total) by mouth 2 (two) times daily. 07/22/15   [name], MD  pantoprazole (PROTONIX) 40 MG tablet Take 1 tablet (40 mg total) by mouth daily. 07/22/15   [name], MD  potassium chloride SA (K-DUR,KLOR-CON) 20 MEQ tablet Take 1 tablet (20 mEq total) by mouth daily. 07/22/15   [name], MD  spironolactone (ALDACTONE) 50 MG tablet Take 1 tablet (50 mg total) by mouth daily. 07/22/15   [name], MD  thiamine 100 MG tablet Take 1 tablet (100 mg total) by mouth daily. Patient not taking: Reported on 06/07/2015 04/15/15   Elease Etienne, MD   Physical Exam: Filed Vitals:   09/05/15 1900 09/05/15 1930 09/05/15 1945 09/05/15 2000  BP: 108/80 104/74 118/77 107/74  Pulse: 99 106 99 102  Temp:      TempSrc:      Resp: SpO2: 96% 97% 97% 97%    Wt Readings from Last 3 Encounters:  08/16/15 83.915 kg (185 lb)  08/09/15 83.915 kg (185 lb)  07/26/15 77.111 kg (170 lb)    General:  Appears calm and comfortable Eyes:  PERRL, EOMI, normal lids, iris ENT:  grossly normal hearing, lips & tongue Neck:  no LAD, masses or thyromegaly, JVD Cardiovascular: RRR, no m/r/g. No LE edema.  Respiratory: Bilateral wheeze, no appreciable rhonchi or rales. Normal respiratory effort. Abdomen: Tender, distended, caput medusae Skin: no rash or induration seen on limited exam Musculoskeletal:  grossly normal tone BUE/BLE Psychiatric:  grossly normal mood and affect, speech fluent and appropriate Neurologic:  CN 2-12 grossly intact, moves all extremities in coordinated fashion.          Labs on Admission:  Basic Metabolic Panel:  Recent Labs Lab 09/05/15 1841  NA 118*  K 4.1  CL 85*  CO2 20*  GLUCOSE 81  BUN 16  CREATININE 1.05    CALCIUM 7.8*   Liver Function Tests:  Recent Labs Lab 09/05/15 1841  AST 105*  ALT 38  ALKPHOS 99  BILITOT 3.5*  PROT 5.5*  ALBUMIN 2.0*   No results for input(s): LIPASE, AMYLASE in the last 168 hours. No results for input(s): AMMONIA in the last 168 hours. CBC:  Recent Labs Lab 09/05/15 1841  WBC 20.4*  NEUTROABS 16.5*  HGB 13.5  HCT 37.1*  MCV 93.9  PLT 82*   Cardiac Enzymes: No results for input(s): CKTOTAL, CKMB, CKMBINDEX, TROPONINI in the last 168 hours.  BNP (last 3 results) No results for input(s): BNP in the last 8760 hours.  ProBNP (last 3 results) No results for input(s): PROBNP in the last 8760 hours.   Creatinine clearance cannot be calculated (Unknown ideal weight.)  CBG: No results for input(s): GLUCAP in the last 168 hours.  Radiological Exams on Admission: No results found.  EKG: Independently reviewed.   Assessment/Plan Active Problems:   Ascites   SBP (spontaneous bacterial peritonitis) (HCC)   Plan. 1. Ascities. Primary reason for  admission. Patient with diffuse abd distension, caput medusae and stigmata consistent with cirrhosis. Will resume Lasix 40 PO daily, aldactone 50 daily. Request US guided paracentesis with fluid analysis for possible SBP. Afebrile but reports diffuse abd pain, Started on ceftriaxone 2g Q24H, check blood culture. Seen by Palliative care during last admit but wishes to remain full code at this time. Consider starting Rifamaxin but unable to afford even directrices. Unknown if has history of varices. Do not see previous GI note but no active bleed on admission.  2. SBP. Follow up cultures and fluid analysis from paracentesis. Continue ABx for now. If results negative, discontinue.  3. Chronic resp failure. No acute exacerbation. Has mild wheeze, white sputum. Continue proventil, pulmicort and duoneb. Not on supplemental oxygen  4. Thrombocytopenia. No active bleed/bruise, monitor. Coagulation studies pending.  Continue to monitor  5. Tobacco abuse. Topical nicoderm daily, cessation advised  6. GERD. Continue home protonix  daily  7. Non-compliance. Homeless, unable to afford medications. Goes to wellness center  8. History of Alcohol Abuse. Continues to consume about 1 beer daily. Continue thiamine and folate.  9. Hypocalcemia. Likely secondary to low protein given cirrhosis. Check ionized calcium  10. DVT prophylaxis. SCDs for now, coag studies pending.   5. Hyponatremic, hypochloremia. Likely secondary to ascities and diuretics.   Code Status: Full    DVT Prophylaxis: SCDs for now, coagulation studies pending. PLT < 100,000 Family Communication: Absent from bedside. Disposition Plan: Pending Improvement    Joella Prince, MD [phone] [facility] [email or web address] Password [email or web address]

## 2015-09-05 NOTE — ED Provider Notes (Signed)
CSN: [id number]     Arrival date & time 09/05/15  1730 History   First MD Initiated Contact with Patient 09/05/15 1807     Chief Complaint  Patient presents with  . Shortness of Breath     (Consider location/radiation/quality/duration/timing/severity/associated sxs/prior Treatment) HPI.Marland KitchenMarland KitchenMarland KitchenLevel V caveat for urgent need for intervention. Patient with end-stage cirrhosis with ascites presents with a recurrent ascitic abdomen. He has required multiple paracentesis'  since fall of 2016. He is homeless and not eating well. His has fallen several times.  Past Medical History  Diagnosis Date  . ETOH abuse   . Cirrhosis of liver (HCC)   . COPD (chronic obstructive pulmonary disease) [facility])    Past Surgical History  Procedure Laterality Date  . No past surgeries      jaw surgery 19yrs ago   Family History  Problem Relation Age of Onset  . Diabetes Mother    Social History  Substance Use Topics  . Smoking status: Current Every Day Smoker -- 0.50 packs/day for 40 years    Types: Cigarettes  . Smokeless tobacco: Never Used  . Alcohol Use: No     Comment: 05/28/15-states still not drinking    Review of Systems  Reason unable to perform ROS: Urgent need for intervention.      Allergies  Review of patient's allergies indicates no known allergies.  Home Medications   Prior to Admission medications   Medication Sig Start Date End Date Taking? Authorizing Provider  albuterol (PROVENTIL HFA;VENTOLIN HFA) 108 (90 Base) MCG/ACT inhaler Inhale 2 puffs into the lungs every 6 (six) hours as needed for wheezing or shortness of breath. 07/22/15   [name], MD  folic acid (FOLVITE) 1 MG tablet Take 1 tablet (1 mg total) by mouth daily. Patient not taking: Reported on 07/22/2015 04/15/15   Elease Etienne, MD  furosemide (LASIX) 40 MG tablet Take 1 tablet (40 mg total) by mouth daily. Patient not taking: Reported on 07/22/2015 05/28/15   [name], MD  lactulose (CHRONULAC) 10 GM/15ML  solution Take 15 mLs (10 g total) by mouth 2 (two) times daily. 07/22/15   [name], MD  pantoprazole (PROTONIX) 40 MG tablet Take 1 tablet (40 mg total) by mouth daily. 07/22/15   [name], MD  potassium chloride SA (K-DUR,KLOR-CON) 20 MEQ tablet Take 1 tablet (20 mEq total) by mouth daily. 07/22/15   [name], MD  spironolactone (ALDACTONE) 50 MG tablet Take 1 tablet (50 mg total) by mouth daily. 07/22/15   [name], MD  thiamine 100 MG tablet Take 1 tablet (100 mg total) by mouth daily. Patient not taking: Reported on 06/07/2015 04/15/15   Elease Etienne, MD   BP 103/68 mmHg  Pulse 102  Temp(Src) 98.3 F (36.8 C) (Oral)  Resp 20  SpO2 91% Physical Exam  Constitutional: He is oriented to person, place, and time.  Cachectic  HENT:  Head: Normocephalic and atraumatic.  Eyes: Conjunctivae and EOM are normal. Pupils are equal, round, and reactive to light.  Neck: Normal range of motion. Neck supple.  Cardiovascular: Normal rate and regular rhythm.   Pulmonary/Chest: Effort normal and breath sounds normal.  Abdominal:  Ascitic abdomen  Musculoskeletal: Normal range of motion.  Neurological: He is alert and oriented to person, place, and time.  Skin: Skin is warm and dry.  Psychiatric: He has a normal mood and affect. His behavior is normal.  Nursing note and vitals reviewed.   ED Course  Procedures (including critical care time)  Labs Review Labs Reviewed  CBC WITH DIFFERENTIAL/PLATELET - Abnormal; Notable for the following:    WBC 20.4 (*)    RBC 3.95 (*)    HCT 37.1 (*)    MCH 34.2 (*)    MCHC 36.4 (*)    Platelets 82 (*)    Neutro Abs 16.5 (*)    Monocytes Absolute 2.2 (*)    All other components within normal limits  COMPREHENSIVE METABOLIC PANEL - Abnormal; Notable for the following:    Sodium 118 (*)    Chloride 85 (*)    CO2 20 (*)    Calcium 7.8 (*)    Total Protein 5.5 (*)    Albumin 2.0 (*)    AST 105 (*)    Total Bilirubin 3.5 (*)    All other  components within normal limits  URINALYSIS, ROUTINE W REFLEX MICROSCOPIC (NOT [facility])    Imaging Review No results found. I have personally reviewed and evaluated these images and lab results as part of my medical decision-making.   EKG Interpretation None      MDM   Final diagnoses:  Alcoholic cirrhosis of liver with ascites (HCC)    Patient has terminal liver disease with ascites. Labs identify a leukocytosis, hyponatremia, hypoalbuminemia.  Urinalysis and chest x-ray pending. Admit to general medicine for paracentesis    Donnetta HutchingBrian Therese Rocco, MD 09/05/15 [phone]

## 2015-09-05 NOTE — ED Notes (Signed)
EDP at bedside  

## 2015-09-06 ENCOUNTER — Inpatient Hospital Stay (HOSPITAL_COMMUNITY): Payer: Medicaid Other

## 2015-09-06 DIAGNOSIS — E871 Hypo-osmolality and hyponatremia: Secondary | ICD-10-CM

## 2015-09-06 DIAGNOSIS — D696 Thrombocytopenia, unspecified: Secondary | ICD-10-CM

## 2015-09-06 DIAGNOSIS — K7031 Alcoholic cirrhosis of liver with ascites: Principal | ICD-10-CM

## 2015-09-06 DIAGNOSIS — J9611 Chronic respiratory failure with hypoxia: Secondary | ICD-10-CM

## 2015-09-06 DIAGNOSIS — R109 Unspecified abdominal pain: Secondary | ICD-10-CM

## 2015-09-06 DIAGNOSIS — J9610 Chronic respiratory failure, unspecified whether with hypoxia or hypercapnia: Secondary | ICD-10-CM | POA: Diagnosis present

## 2015-09-06 DIAGNOSIS — E46 Unspecified protein-calorie malnutrition: Secondary | ICD-10-CM

## 2015-09-06 DIAGNOSIS — R1084 Generalized abdominal pain: Secondary | ICD-10-CM

## 2015-09-06 LAB — CBC WITH DIFFERENTIAL/PLATELET
BASOS PCT: 0 %
Basophils Absolute: 0 10*3/uL (ref 0.0–0.1)
EOS PCT: 0 %
Eosinophils Absolute: 0 10*3/uL (ref 0.0–0.7)
HEMATOCRIT: 36 % — AB (ref 39.0–52.0)
HEMOGLOBIN: 13.2 g/dL (ref 13.0–17.0)
LYMPHS PCT: 12 %
Lymphs Abs: 1.7 10*3/uL (ref 0.7–4.0)
MCH: 34 pg (ref 26.0–34.0)
MCHC: 36.7 g/dL — AB (ref 30.0–36.0)
MCV: 92.8 fL (ref 78.0–100.0)
MONO ABS: 1.5 10*3/uL — AB (ref 0.1–1.0)
MONOS PCT: 10 %
NEUTROS PCT: 78 %
Neutro Abs: 11.3 10*3/uL — ABNORMAL HIGH (ref 1.7–7.7)
Platelets: 65 10*3/uL — ABNORMAL LOW (ref 150–400)
RBC: 3.88 MIL/uL — AB (ref 4.22–5.81)
RDW: 13.8 % (ref 11.5–15.5)
WBC: 14.5 10*3/uL — ABNORMAL HIGH (ref 4.0–10.5)

## 2015-09-06 LAB — COMPREHENSIVE METABOLIC PANEL
ALK PHOS: 96 U/L (ref 38–126)
ALT: 39 U/L (ref 17–63)
ANION GAP: 11 (ref 5–15)
AST: 90 U/L — ABNORMAL HIGH (ref 15–41)
Albumin: 2 g/dL — ABNORMAL LOW (ref 3.5–5.0)
BILIRUBIN TOTAL: 2.7 mg/dL — AB (ref 0.3–1.2)
BUN: 19 mg/dL (ref 6–20)
CALCIUM: 8.2 mg/dL — AB (ref 8.9–10.3)
CO2: 24 mmol/L (ref 22–32)
CREATININE: 1.25 mg/dL — AB (ref 0.61–1.24)
Chloride: 82 mmol/L — ABNORMAL LOW (ref 101–111)
GFR calc non Af Amer: >60 mL/min (ref >60)
Glucose, Bld: 83 mg/dL (ref 65–99)
Potassium: 4 mmol/L (ref 3.5–5.1)
Sodium: 117 mmol/L — CL (ref 135–145)
TOTAL PROTEIN: 5.3 g/dL — AB (ref 6.5–8.1)

## 2015-09-06 LAB — GLUCOSE, SEROUS FLUID: GLUCOSE FL: 84 mg/dL

## 2015-09-06 LAB — SODIUM, URINE, RANDOM

## 2015-09-06 LAB — CREATININE, URINE, RANDOM: Creatinine, Urine: 138.12 mg/dL

## 2015-09-06 LAB — GRAM STAIN

## 2015-09-06 LAB — URINALYSIS, ROUTINE W REFLEX MICROSCOPIC
Glucose, UA: NEGATIVE mg/dL
KETONES UR: 15 mg/dL — AB
NITRITE: POSITIVE — AB
PROTEIN: NEGATIVE mg/dL
Specific Gravity, Urine: 1.017 (ref 1.005–1.030)
pH: 6 (ref 5.0–8.0)

## 2015-09-06 LAB — PROTEIN, BODY FLUID

## 2015-09-06 LAB — URINE MICROSCOPIC-ADD ON

## 2015-09-06 LAB — LACTATE DEHYDROGENASE, PLEURAL OR PERITONEAL FLUID: LD, Fluid: 38 U/L — ABNORMAL HIGH (ref 3–23)

## 2015-09-06 LAB — AMMONIA: Ammonia: 67 umol/L — ABNORMAL HIGH (ref 9–35)

## 2015-09-06 MED ORDER — LIDOCAINE HCL (PF) 1 % IJ SOLN
INTRAMUSCULAR | Status: AC
  Filled 2015-09-06: qty 10

## 2015-09-06 MED ORDER — ALBUMIN HUMAN 25 % IV SOLN
25.0000 g | Freq: Four times a day (QID) | INTRAVENOUS | Status: AC
  Administered 2015-09-06: 25 g via INTRAVENOUS
  Filled 2015-09-06: qty 100

## 2015-09-06 MED ORDER — SODIUM CHLORIDE 0.9 % IV BOLUS (SEPSIS)
500.0000 mL | Freq: Once | INTRAVENOUS | Status: AC
  Administered 2015-09-06: 500 mL via INTRAVENOUS

## 2015-09-06 NOTE — Progress Notes (Signed)
New Admission Note:   Arrival Method: Via stretcher from ED Mental Orientation:  Alert and oriented. Telemetry: N/A Assessment: Completed Skin:  Skin is dry and scaly.  He has multiple tattoos over entire body.  He is slightly jaundiced appearing. IV:  Right Hand NSL Pain: C/O severe abdominal pain - 10/10 Tubes:  None Safety Measures: Safety Fall Prevention Plan has been given, discussed and signed Admission: Completed [address] [place] Orientation: Patient has been orientated to the room, unit and staff.  Family:  None at bedside.  Patient has chronic paracentesis.  He missed his appointment 1 week ago because he did not have a bus pass.  He has been out of his medications for one week because he cannot afford them.  He is also homeless on the streets with his brother.  He states he has fallen multiple times.  His brother currently has his walker.  I have placed a Child psychotherapistsocial worker consult.  Orders have been reviewed and implemented. Will continue to monitor the patient. Call light has been placed within reach and bed alarm has been activated.   Bernie CoveyKimberly Wealthy Danielski RN- Marsa ArisBC, [place] Phone number: [phone]

## 2015-09-06 NOTE — Progress Notes (Signed)
Upon patient's arrival to the floor, he vomited approximately 400 cc of greenish vomitus.  He feels that he is unable to take any PO medications.  Called and made [facility] aware.  They updated several medications to IV route.  These were given.  Will continue to monitor patient.  Owens & MinorKimberly Maili Shutters RN-BC, WTA.

## 2015-09-06 NOTE — Progress Notes (Signed)
[facility] PROGRESS NOTE    Progress Note   [name] ZOX:[id number] DOB: Sep 08, 1960 DOA: 09/05/2015 PCP: [name], MD   Brief Narrative:   [name] is an 55 y.o. male with ongoing alcohol abuse and noncompliance the comes into the hospital because diffuse abdominal pain with nausea and vomiting, he denies any fever or chills. He did not get his paracentesis last week.  Assessment/Plan:   Diffuse abdominal pain in the setting of Ascites/? Due to SBP (spontaneous bacterial peritonitis) (HCC): Hold Lasix as his creatinine has increased. We'll proceed with ultrasound-guided paracentesis will send for cytology cell count and stain and cultures. Continue spironolactone and agree with IV antibiotic regimen. Patient has not been compliant with diet. As remained afebrile with improvement in his leukocytosis.  Chronic Thrombocytopenia (HCC) Baseline continue to monitor.  Alcoholic cirrhosis of liver with ascites (HCC) Continue Aldactone hold Lasix. He relates he continues to drink alcohol.  Hyponatremia with excess extracellular fluid volume: Check urinary sodium and urinary creatinine, his specific gravity on admission was 1017. Putting towards dehydration.  Protein-calorie malnutrition (HCC) Ensure 3 times a day.    Chronic respiratory failure (HCC) Cont oxygen.  Hypocalcemia: Corrected for his albumin calcium is within normal.    DVT Prophylaxis - Lovenox ordered.  Family Communication: none Disposition Plan: Home unable to detertmine Code Status:     Code Status Orders        Start     Ordered   09/05/15 2028  Full code   Continuous     09/05/15 2031    Code Status History    Date Active Date Inactive Code Status Order ID Comments User Context   04/21/2015  4:42 AM 04/25/2015  3:53 PM Full Code [id number]  Clydie Braun, MD Inpatient   04/10/2015 10:34 PM 04/15/2015  5:19 PM Full Code [id number]  Eston Esters, MD ED   03/28/2015  6:23 AM  03/29/2015  4:24 PM Full Code [id number]  Rolly Salter, MD Inpatient   03/12/2015  3:59 PM 03/15/2015  8:08 PM Full Code [id number]  Jerald Kief, MD ED   08/25/2013  4:16 PM 08/29/2013  5:32 PM Full Code [id number]  Sanjuana Kava, NP Inpatient   08/25/2013 12:36 AM 08/25/2013  4:16 PM Full Code [id number]  Gavin Pound. Ghim, MD ED        IV Access:    Peripheral IV   Procedures and diagnostic studies:   Dg Chest 2 View  09/05/2015  CLINICAL DATA:  Nausea, vomiting, diarrhea for 3 days EXAM: CHEST  2 VIEW COMPARISON:  06/07/2015 FINDINGS: There is left lower lobe airspace disease. There is no pleural effusion or pneumothorax. The heart and mediastinal contours are unremarkable. The osseous structures are unremarkable. IMPRESSION: Left lower lobe pneumonia. Followup PA and lateral chest X-ray is recommended in 3-4 weeks following trial of antibiotic therapy to ensure resolution and exclude underlying malignancy. Electronically Signed   By: Elige Ko   On: 09/05/2015 20:31     Medical Consultants:    None.  Anti-Infectives:   Anti-infectives    Start     Dose/Rate Route Frequency Ordered Stop   09/05/15 2030  cefTRIAXone (ROCEPHIN) 2 g in dextrose 5 % 50 mL IVPB     2 g 100 mL/hr over 30 Minutes Intravenous Every 24 hours 09/05/15 2023        Subjective:    [name] continues to complain of abdominal pain.  Objective:    Filed Vitals:  09/05/15 2150 09/05/15 2243 09/06/15 0521 09/06/15 0608  BP: 111/68  81/50 84/44  Pulse: 102  89   Temp: 98 F (36.7 C)  97.8 F (36.6 C)   TempSrc: Oral  Oral   Resp: 20  18   Height: 6\' 1"  (1.854 m)     Weight: 77.6 kg (171 lb 1.2 oz)     SpO2: 94% 95% 95%     Intake/Output Summary (Last 24 hours) at 09/06/15 0747 Last data filed at 09/06/15 0524  Gross per 24 hour  Intake     50 ml  Output      0 ml  Net     50 ml   Filed Weights   09/05/15 2150  Weight: 77.6 kg (171 lb 1.2 oz)    Exam: Gen:  NAD Cardiovascular:   RRR. Chest and lungs:   CTAB Abdomen: Abdomen is soft with diffuse tenderness, extremely distended no rebound or guarding. Extremities:  Mild woody edema   Data Reviewed:    Labs: Basic Metabolic Panel:  Recent Labs Lab 09/05/15 1841 09/06/15 0530  NA 118* 117*  K 4.1 4.0  CL 85* 82*  CO2 20* 24  GLUCOSE 81 83  BUN 16 19  CREATININE 1.05 1.25*  CALCIUM 7.8* 8.2*   GFR Estimated Creatinine Clearance: 74.2 mL/min (by C-G formula based on Cr of 1.25). Liver Function Tests:  Recent Labs Lab 09/05/15 1841 09/06/15 0530  AST 105* 90*  ALT 38 39  ALKPHOS 99 96  BILITOT 3.5* 2.7*  PROT 5.5* 5.3*  ALBUMIN 2.0* 2.0*   No results for input(s): LIPASE, AMYLASE in the last 168 hours.  Recent Labs Lab 09/05/15 2114 09/06/15 0530  AMMONIA 64* 67*   Coagulation profile  Recent Labs Lab 09/05/15 2114  INR 1.45    CBC:  Recent Labs Lab 09/05/15 1841 09/06/15 0530  WBC 20.4* 14.5*  NEUTROABS 16.5* 11.3*  HGB 13.5 13.2  HCT 37.1* 36.0*  MCV 93.9 92.8  PLT 82* 65*   Cardiac Enzymes: No results for input(s): CKTOTAL, CKMB, CKMBINDEX, TROPONINI in the last 168 hours. BNP (last 3 results) No results for input(s): PROBNP in the last 8760 hours. CBG: No results for input(s): GLUCAP in the last 168 hours. D-Dimer: No results for input(s): DDIMER in the last 72 hours. Hgb A1c: No results for input(s): HGBA1C in the last 72 hours. Lipid Profile: No results for input(s): CHOL, HDL, LDLCALC, TRIG, CHOLHDL, LDLDIRECT in the last 72 hours. Thyroid function studies: No results for input(s): TSH, T4TOTAL, T3FREE, THYROIDAB in the last 72 hours.  Invalid input(s): FREET3 Anemia work up: No results for input(s): VITAMINB12, FOLATE, FERRITIN, TIBC, IRON, RETICCTPCT in the last 72 hours. Sepsis Labs:  Recent Labs Lab 09/05/15 1841 09/06/15 0530  WBC 20.4* 14.5*   Microbiology No results found for this or any previous visit (from the past 240  hour(s)).   Medications:   . budesonide (PULMICORT) nebulizer solution  0.25 mg Nebulization BID  . cefTRIAXone (ROCEPHIN)  IV  2 g Intravenous Q24H  . folic acid  1 mg Oral Daily  . furosemide  40 mg Intravenous Daily  . ipratropium-albuterol  3 mL Nebulization BID  . lactulose  10 g Oral TID  . multivitamin with minerals  1 tablet Oral Daily  . nicotine  14 mg Transdermal Daily  . pantoprazole (PROTONIX) IV  40 mg Intravenous Q24H  . potassium chloride SA  20 mEq Oral Daily  . sodium chloride  500 mL  Intravenous Once  . spironolactone  50 mg Oral Daily  . thiamine  100 mg Oral Daily   Continuous Infusions:   Time spent: 25 min   LOS: 1 day   Marinda Elk  [facility] Pager [phone]  *Please refer to [email or web address], password [email or web address] to get updated schedule on who will round on this patient, as hospitalists switch teams weekly. If 7PM-7AM, please contact night-coverage at [email or web address], password [email or web address] for any overnight needs.  09/06/2015, 7:47 AM

## 2015-09-06 NOTE — Progress Notes (Signed)
At 0521, VS were obtained via Dinam[employer].  B/P was 81/50.  Patient was c/o feeling light headed.  At 0608, manual b/p was obtained and B/P was 84/44.  Also, at beginning of shift, a condom cath was put on patient.  During the night, the patient had no urine output.  Unable to do bladder scan d/t extreme tenderness of abdomen and abdominal distention.  Lab called with a critical Na+ level of 117.  [facility] was called and made aware of the above.  Received order for Foley and this was inserted without difficulty.  Also, 500 NS Bolus was started to run over 2 hours.  Rechecked B/P manually at 0755 and it is now 98/56.  I am concerned about this IV lasting so I asked for IV Team to assess for 2nd site.  Will continue to monitor patient.  Owens & MinorKimberly Brae Schaafsma RN-BC, WTA.

## 2015-09-06 NOTE — Procedures (Signed)
US guided RLQ paracentesis  5.2 liter yellow fluid obtained Max per MD Sent for labs per MD  Tolerated well

## 2015-09-07 DIAGNOSIS — R188 Other ascites: Secondary | ICD-10-CM

## 2015-09-07 DIAGNOSIS — K652 Spontaneous bacterial peritonitis: Secondary | ICD-10-CM

## 2015-09-07 LAB — BASIC METABOLIC PANEL
Anion gap: 9 (ref 5–15)
Anion gap: 8 (ref 5–15)
BUN: 18 mg/dL (ref 6–20)
BUN: 16 mg/dL (ref 6–20)
CHLORIDE: 89 mmol/L — AB (ref 101–111)
CHLORIDE: 90 mmol/L — AB (ref 101–111)
CO2: 25 mmol/L (ref 22–32)
CO2: 25 mmol/L (ref 22–32)
CREATININE: 0.99 mg/dL (ref 0.61–1.24)
CREATININE: 0.91 mg/dL (ref 0.61–1.24)
Calcium: 8.2 mg/dL — ABNORMAL LOW (ref 8.9–10.3)
Calcium: 8 mg/dL — ABNORMAL LOW (ref 8.9–10.3)
GFR calc Af Amer: >60 mL/min (ref >60)
GFR calc non Af Amer: >60 mL/min (ref >60)
GFR calc non Af Amer: >60 mL/min (ref >60)
GLUCOSE: 107 mg/dL — AB (ref 65–99)
Glucose, Bld: 107 mg/dL — ABNORMAL HIGH (ref 65–99)
Potassium: 4.2 mmol/L (ref 3.5–5.1)
Potassium: 3.7 mmol/L (ref 3.5–5.1)
Sodium: 123 mmol/L — ABNORMAL LOW (ref 135–145)
Sodium: 123 mmol/L — ABNORMAL LOW (ref 135–145)

## 2015-09-07 LAB — MISC LABCORP TEST (SEND OUT): LABCORP TEST CODE: 88062

## 2015-09-07 LAB — TRIGLYCERIDES, BODY FLUIDS: Triglycerides, Fluid: 27 mg/dL

## 2015-09-07 LAB — CBC
HEMATOCRIT: 36.4 % — AB (ref 39.0–52.0)
HEMOGLOBIN: 13.2 g/dL (ref 13.0–17.0)
MCH: 34.6 pg — AB (ref 26.0–34.0)
MCHC: 36.3 g/dL — AB (ref 30.0–36.0)
MCV: 95.5 fL (ref 78.0–100.0)
Platelets: 38 10*3/uL — ABNORMAL LOW (ref 150–400)
RBC: 3.81 MIL/uL — ABNORMAL LOW (ref 4.22–5.81)
RDW: 14.3 % (ref 11.5–15.5)
WBC: 9.2 10*3/uL (ref 4.0–10.5)

## 2015-09-07 LAB — CALCIUM, IONIZED: CALCIUM, IONIZED, SERUM: 4.6 mg/dL (ref 4.5–5.6)

## 2015-09-07 MED ORDER — SODIUM CHLORIDE 1 G PO TABS
1.0000 g | ORAL_TABLET | Freq: Two times a day (BID) | ORAL | Status: AC
  Administered 2015-09-07 – 2015-09-09 (×4): 1 g via ORAL
  Filled 2015-09-07 (×4): qty 1

## 2015-09-07 NOTE — Progress Notes (Signed)
Patient c/o problems swallowing and throat hurting, text page sent to Dr Cena BentonVega awaiting call back.  [name], Bryce HellerMiranda Lynn, RN

## 2015-09-07 NOTE — Clinical Social Work Note (Signed)
CSW received a consult that the patient was homeless. Patient is alert and oriented. Mr. [name] was watching television when CSW intern entered the room.When asked how he was doing, the patient expressed that he "felt bad" and couldn't swallow.  Mr. [name] stated that since last Saturday he and his brother [name]([name]) has been homeless. According to Mr. [name], he and his brother were paying rent at a motel for the past four years, but did not remember the name of the motel. Mr. [name] stated that his brother has MS and receives disability checks, which is how they were able to afford rent at the motel. 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  After a deduction was taken from his brother's disability check, the patient and his brother could no longer pay the rent at the motel. After leaving the motel,  Mr. [name] stated that he and his brother went to the homeless shelters in the area, but all were full.  CSW will continue to follow patient. Patient had no further questions or concerns at this time.    Bryce DaubJolan Pearlena [name], Student Social Work Intern  [facility]  [phone]

## 2015-09-07 NOTE — Progress Notes (Signed)
[facility] PROGRESS NOTE    Progress Note   Ved Martos ZOX:[id number] DOB: [birth year] DOA: 09/05/2015 PCP: [name], MD   Brief Narrative:   [name] is an 55 y.o. male with ongoing alcohol abuse and noncompliance the comes into the hospital because diffuse abdominal pain with nausea and vomiting, he denies any fever or chills. He did not get his paracentesis last week.  Assessment/Plan:   Diffuse abdominal pain in the setting of Ascites/? Due to SBP (spontaneous bacterial peritonitis) (HCC): Hold Lasix as his creatinine has increased. Currently awaiting ultrasound-guided paracentesis will send for cytology cell count and stain and cultures. Continue spironolactone and agree with IV antibiotic regimen. Patient has not been compliant with diet. As remained afebrile with improvement in his leukocytosis.  Chronic Thrombocytopenia (HCC) Baseline continue to monitor.  Alcoholic cirrhosis of liver with ascites (HCC) Continue Aldactone hold Lasix. He relates he continues to drink alcohol.  Hyponatremia with excess extracellular fluid volume: Most likely due to poor oral solute intake and hypervolemic hyponatremia given history of cirrhosis. Will administer sodium tablets at low dose since I am unable to provide normal saline administration.  Protein-calorie malnutrition (HCC) Ensure 3 times a day.    Chronic respiratory failure (HCC) Cont oxygen.  Hypocalcemia: Corrected for his albumin calcium is within normal.    DVT Prophylaxis - Lovenox ordered.  Family Communication: none Disposition Plan: With improvement in sodium levels and  Code Status:     Code Status Orders        Start     Ordered   09/05/15 2028  Full code   Continuous     09/05/15 2031    Code Status History    Date Active Date Inactive Code Status Order ID Comments User Context   04/21/2015  4:42 AM 04/25/2015  3:53 PM Full Code [id number]  Clydie Braun, MD Inpatient   04/10/2015  10:34 PM 04/15/2015  5:19 PM Full Code [id number]  Eston Esters, MD ED   03/28/2015  6:23 AM 03/29/2015  4:24 PM Full Code [id number]  Rolly Salter, MD Inpatient   03/12/2015  3:59 PM 03/15/2015  8:08 PM Full Code [id number]  Jerald Kief, MD ED   08/25/2013  4:16 PM 08/29/2013  5:32 PM Full Code [id number]  Sanjuana Kava, NP Inpatient   08/25/2013 12:36 AM 08/25/2013  4:16 PM Full Code [id number]  Gavin Pound. Ghim, MD ED        IV Access:    Peripheral IV   Procedures and diagnostic studies:   Dg Chest 2 View  09/05/2015  CLINICAL DATA:  Nausea, vomiting, diarrhea for 3 days EXAM: CHEST  2 VIEW COMPARISON:  06/07/2015 FINDINGS: There is left lower lobe airspace disease. There is no pleural effusion or pneumothorax. The heart and mediastinal contours are unremarkable. The osseous structures are unremarkable. IMPRESSION: Left lower lobe pneumonia. Followup PA and lateral chest X-ray is recommended in 3-4 weeks following trial of antibiotic therapy to ensure resolution and exclude underlying malignancy. Electronically Signed   By: Elige Ko   On: 09/05/2015 20:31   US Paracentesis  09/06/2015  INDICATION: ascites EXAM: ULTRASOUND-GUIDED PARACENTESIS COMPARISON:  Previous para MEDICATIONS: 10 cc 1% lidocaine COMPLICATIONS: None immediate. TECHNIQUE: Informed written consent was obtained from the patient after a discussion of the risks, benefits and alternatives to treatment. A timeout was performed prior to the initiation of the procedure. Initial ultrasound scanning demonstrates a large amount of ascites within the right lower abdominal quadrant. The  right lower abdomen was prepped and draped in the usual sterile fashion. 1% lidocaine with epinephrine was used for local anesthesia. Under direct ultrasound guidance, a 19 gauge, 7-cm, Yueh catheter was introduced. An ultrasound image was saved for documentation purposed. The paracentesis was performed. The catheter was removed and a dressing was applied.  The patient tolerated the procedure well without immediate post procedural complication. FINDINGS: A total of approximately 5.2 liters of yellow fluid was removed. Samples were sent to the laboratory as requested by the clinical team. IMPRESSION: Successful ultrasound-guided paracentesis yielding 5.2 liters of peritoneal fluid. Maximum withdrawn per MD Read by:  Robet Leu [facility] Electronically Signed   By: Judie Petit.  Shick M.D.   On: 09/06/2015 11:32     Medical Consultants:    None.  Anti-Infectives:   Anti-infectives    Start     Dose/Rate Route Frequency Ordered Stop   09/05/15 2030  cefTRIAXone (ROCEPHIN) 2 g in dextrose 5 % 50 mL IVPB     2 g 100 mL/hr over 30 Minutes Intravenous Every 24 hours 09/05/15 2023        Subjective:    [name] continues to complain of abdominal pain.  Objective:    Filed Vitals:   09/06/15 2100 09/07/15 0500 09/07/15 0923 09/07/15 1022  BP: 91/54 90/56  91/61  Pulse: 89 104  105  Temp: 97.6 F (36.4 C) 97.9 F (36.6 C)  98.2 F (36.8 C)  TempSrc: Oral Oral  Oral  Resp: Height:      Weight:      SpO2: 92% 93% 83% 93%    Intake/Output Summary (Last 24 hours) at 09/07/15 1635 Last data filed at 09/07/15 1300  Gross per 24 hour  Intake    960 ml  Output    875 ml  Net     85 ml   Filed Weights   09/05/15 2150  Weight: 77.6 kg (171 lb 1.2 oz)    Exam: Gen:  NAD Cardiovascular:  RRR. Chest and lungs:   CTAB, no wheezes Abdomen: Abdomen is soft with diffuse tenderness, extremely distended no rebound or guarding. Extremities:  Mild woody edema   Data Reviewed:    Labs: Basic Metabolic Panel:  Recent Labs Lab 09/05/15 1841 09/06/15 0530  NA 118* 117*  K 4.1 4.0  CL 85* 82*  CO2 20* 24  GLUCOSE 81 83  BUN 16 19  CREATININE 1.05 1.25*  CALCIUM 7.8* 8.2*   GFR Estimated Creatinine Clearance: 74.2 mL/min (by C-G formula based on Cr of 1.25). Liver Function Tests:  Recent Labs Lab 09/05/15 1841  09/06/15 0530  AST 105* 90*  ALT 38 39  ALKPHOS 99 96  BILITOT 3.5* 2.7*  PROT 5.5* 5.3*  ALBUMIN 2.0* 2.0*   No results for input(s): LIPASE, AMYLASE in the last 168 hours.  Recent Labs Lab 09/05/15 2114 09/06/15 0530  AMMONIA 64* 67*   Coagulation profile  Recent Labs Lab 09/05/15 2114  INR 1.45    CBC:  Recent Labs Lab 09/05/15 1841 09/06/15 0530  WBC 20.4* 14.5*  NEUTROABS 16.5* 11.3*  HGB 13.5 13.2  HCT 37.1* 36.0*  MCV 93.9 92.8  PLT 82* 65*   Cardiac Enzymes: No results for input(s): CKTOTAL, CKMB, CKMBINDEX, TROPONINI in the last 168 hours. BNP (last 3 results) No results for input(s): PROBNP in the last 8760 hours. CBG: No results for input(s): GLUCAP in the last 168 hours. D-Dimer: No results for input(s): DDIMER  in the last 72 hours. Hgb A1c: No results for input(s): HGBA1C in the last 72 hours. Lipid Profile: No results for input(s): CHOL, HDL, LDLCALC, TRIG, CHOLHDL, LDLDIRECT in the last 72 hours. Thyroid function studies: No results for input(s): TSH, T4TOTAL, T3FREE, THYROIDAB in the last 72 hours.  Invalid input(s): FREET3 Anemia work up: No results for input(s): VITAMINB12, FOLATE, FERRITIN, TIBC, IRON, RETICCTPCT in the last 72 hours. Sepsis Labs:  Recent Labs Lab 09/05/15 1841 09/06/15 0530  WBC 20.4* 14.5*   Microbiology Recent Results (from the past 240 hour(s))  Culture, blood (routine x 2)     Status: None (Preliminary result)   Collection Time: 09/05/15  9:02 PM  Result Value Ref Range Status   Specimen Description BLOOD RIGHT ANTECUBITAL  Final   Special Requests BOTTLES DRAWN AEROBIC AND ANAEROBIC 5CC  Final   Culture NO GROWTH 2 DAYS  Final   Report Status PENDING  Incomplete  Culture, blood (routine x 2)     Status: None (Preliminary result)   Collection Time: 09/05/15  9:16 PM  Result Value Ref Range Status   Specimen Description BLOOD LEFT HAND  Final   Special Requests BOTTLES DRAWN AEROBIC AND ANAEROBIC 5CC   Final   Culture NO GROWTH 2 DAYS  Final   Report Status PENDING  Incomplete  Culture, body fluid-bottle     Status: None (Preliminary result)   Collection Time: 09/06/15 11:37 AM  Result Value Ref Range Status   Specimen Description FLUID PERITONEAL  Final   Special Requests NONE  Final   Culture NO GROWTH 1 DAY  Final   Report Status PENDING  Incomplete  Gram stain     Status: None   Collection Time: 09/06/15 11:37 AM  Result Value Ref Range Status   Specimen Description FLUID PERITONEAL  Final   Special Requests NONE  Final   Gram Stain   Final    FEW WBC PRESENT,BOTH PMN AND MONONUCLEAR NO ORGANISMS SEEN    Report Status 09/06/2015 FINAL  Final     Medications:   . budesonide (PULMICORT) nebulizer solution  0.25 mg Nebulization BID  . cefTRIAXone (ROCEPHIN)  IV  2 g Intravenous Q24H  . folic acid  1 mg Oral Daily  . ipratropium-albuterol  3 mL Nebulization BID  . lactulose  10 g Oral TID  . multivitamin with minerals  1 tablet Oral Daily  . nicotine  14 mg Transdermal Daily  . pantoprazole (PROTONIX) IV  40 mg Intravenous Q24H  . potassium chloride SA  20 mEq Oral Daily  . spironolactone  50 mg Oral Daily  . thiamine  100 mg Oral Daily   Continuous Infusions:   Time spent: 25 min   LOS: 2 days   [name], [name]  [facility] Pager [phone]  *Please refer to [email or web address], password [email or web address] to get updated schedule on who will round on this patient, as hospitalists switch teams weekly. If 7PM-7AM, please contact night-coverage at [email or web address], password [email or web address] for any overnight needs.  09/07/2015, 4:35 PM

## 2015-09-08 LAB — BASIC METABOLIC PANEL
ANION GAP: 9 (ref 5–15)
Anion gap: 7 (ref 5–15)
Anion gap: 10 (ref 5–15)
BUN: 15 mg/dL (ref 6–20)
BUN: 14 mg/dL (ref 6–20)
BUN: 13 mg/dL (ref 6–20)
CHLORIDE: 90 mmol/L — AB (ref 101–111)
CHLORIDE: 90 mmol/L — AB (ref 101–111)
CHLORIDE: 89 mmol/L — AB (ref 101–111)
CO2: 24 mmol/L (ref 22–32)
CO2: 25 mmol/L (ref 22–32)
CO2: 26 mmol/L (ref 22–32)
CREATININE: 0.84 mg/dL (ref 0.61–1.24)
Calcium: 7.9 mg/dL — ABNORMAL LOW (ref 8.9–10.3)
Calcium: 8 mg/dL — ABNORMAL LOW (ref 8.9–10.3)
Calcium: 8.1 mg/dL — ABNORMAL LOW (ref 8.9–10.3)
Creatinine, Ser: 0.78 mg/dL (ref 0.61–1.24)
Creatinine, Ser: 0.87 mg/dL (ref 0.61–1.24)
GFR calc non Af Amer: >60 mL/min (ref >60)
GFR calc non Af Amer: >60 mL/min (ref >60)
GFR calc non Af Amer: >60 mL/min (ref >60)
Glucose, Bld: 111 mg/dL — ABNORMAL HIGH (ref 65–99)
Glucose, Bld: 118 mg/dL — ABNORMAL HIGH (ref 65–99)
Glucose, Bld: 130 mg/dL — ABNORMAL HIGH (ref 65–99)
POTASSIUM: 3.7 mmol/L (ref 3.5–5.1)
POTASSIUM: 3.9 mmol/L (ref 3.5–5.1)
POTASSIUM: 4.1 mmol/L (ref 3.5–5.1)
SODIUM: 121 mmol/L — AB (ref 135–145)
SODIUM: 125 mmol/L — AB (ref 135–145)
SODIUM: 124 mmol/L — AB (ref 135–145)

## 2015-09-08 MED ORDER — FUROSEMIDE 40 MG PO TABS
40.0000 mg | ORAL_TABLET | Freq: Every day | ORAL | Status: DC
  Administered 2015-09-08 – 2015-09-09 (×2): 40 mg via ORAL
  Filled 2015-09-08 (×3): qty 1

## 2015-09-08 MED ORDER — BUDESONIDE 0.25 MG/2ML IN SUSP
0.2500 mg | Freq: Two times a day (BID) | RESPIRATORY_TRACT | Status: DC
  Administered 2015-09-08 – 2015-09-10 (×5): 0.25 mg via RESPIRATORY_TRACT
  Filled 2015-09-08 (×5): qty 2

## 2015-09-08 MED ORDER — PANTOPRAZOLE SODIUM 40 MG PO TBEC
40.0000 mg | DELAYED_RELEASE_TABLET | Freq: Every day | ORAL | Status: DC
Start: 2015-09-09 — End: 2015-09-10
  Administered 2015-09-09 – 2015-09-10 (×2): 40 mg via ORAL
  Filled 2015-09-08 (×2): qty 1

## 2015-09-08 NOTE — Progress Notes (Signed)
PHARMACIST - PHYSICIAN COMMUNICATION DR:   Cena BentonVega CONCERNING: Protonix IV to Oral Route Change Policy  RECOMMENDATION: This patient is receiving Protonix by the intravenous route.  Based on criteria approved by the Pharmacy and Therapeutics Committee, this drug is being converted to the equivalent oral dose form(s).  DESCRIPTION: These criteria include:  The patient is eating (either orally or via tube) and/or has been taking other orally administered medications for a least 24 hours  There is no active GI bleed or impaired GI absorption noted.   If you have questions about this conversion, please contact the Pharmacy Department  []   [phone]( [phone] )  [facility] [x]   [phone]( [phone] )  [facility]  []   [phone]( [phone] )  [facility] []   [phone]( [phone] )  [facility]     Georgina PillionElizabeth Wania Longstreth, PharmD, BCPS Clinical Pharmacist Pager: [phone] 09/08/2015 1:47 PM

## 2015-09-08 NOTE — Progress Notes (Signed)
[facility] PROGRESS NOTE    Progress Note   [name] ZOX:[id number] DOB: 1960-06-04 DOA: 09/05/2015 PCP: Jaclyn ShaggyEnobong, [name], MD   Brief Narrative:   [name] is an 55 y.o. male with ongoing alcohol abuse and noncompliance the comes into the hospital because diffuse abdominal pain with nausea and vomiting, he denies any fever or chills. He did not get his paracentesis last week.  Assessment/Plan:   Diffuse abdominal pain in the setting of Ascites/? Due to SBP (spontaneous bacterial peritonitis) (HCC): Hold Lasix as his creatinine has increased. S/p ultrasound-guided paracentesis, will send for cytology cell count and stain and cultures. Continue spironolactone and agree with IV antibiotic regimen. Patient has not been compliant with diet. has remained afebrile with improvement in his leukocytosis. - obtain cbc next am. - Add lasix today.  Chronic Thrombocytopenia (HCC) Baseline continue to monitor.  Alcoholic cirrhosis of liver with ascites (HCC) Continue Aldactone, restart Lasix. He relates he continues to drink alcohol.  Hyponatremia with excess extracellular fluid volume: Most likely due to poor oral solute intake and hypervolemic hyponatremia given history of cirrhosis. Improving on sodium tablets at low dose since I am unable to provide normal saline administration.  Protein-calorie malnutrition (HCC) Ensure 3 times a day.    Chronic respiratory failure (HCC) Cont oxygen.  Hypocalcemia: Corrected for his albumin calcium is within normal.    DVT Prophylaxis - Lovenox ordered.  Family Communication: none Disposition Plan: With improvement in sodium levels and  Code Status:     Code Status Orders        Start     Ordered   09/05/15 2028  Full code   Continuous     09/05/15 2031    Code Status History    Date Active Date Inactive Code Status Order ID Comments User Context   04/21/2015  4:42 AM 04/25/2015  3:53 PM Full Code [id number]  Clydie Braunondell A  Smith, MD Inpatient   04/10/2015 10:34 PM 04/15/2015  5:19 PM Full Code [id number]  Eston EstersAhmad Hamad, MD ED   03/28/2015  6:23 AM 03/29/2015  4:24 PM Full Code [id number]  Rolly SalterPranav M Patel, MD Inpatient   03/12/2015  3:59 PM 03/15/2015  8:08 PM Full Code [id number]  Jerald KiefStephen K Chiu, MD ED   08/25/2013  4:16 PM 08/29/2013  5:32 PM Full Code [id number]  Sanjuana KavaAgnes I Nwoko, NP Inpatient   08/25/2013 12:36 AM 08/25/2013  4:16 PM Full Code [id number]  Gavin PoundMichael Y. Ghim, MD ED        IV Access:    Peripheral IV   Procedures and diagnostic studies:   No results found.   Medical Consultants:    None.  Anti-Infectives:   Anti-infectives    Start     Dose/Rate Route Frequency Ordered Stop   09/05/15 2030  cefTRIAXone (ROCEPHIN) 2 g in dextrose 5 % 50 mL IVPB     2 g 100 mL/hr over 30 Minutes Intravenous Every 24 hours 09/05/15 2023        Subjective:    [name] continues to complain of abdominal pain.  Objective:    Filed Vitals:   09/07/15 2126 09/08/15 0521 09/08/15 0929 09/08/15 1000  BP: 86/59 90/63  104/71  Pulse: 89 89  20  Temp: 97.5 F (36.4 C) 97.8 F (36.6 C)  98.1 F (36.7 C)  TempSrc: Oral Oral  Oral  Resp: 18 3  18   Height:      Weight: 73 kg (160 lb 15 oz)  SpO2: 96% 95% 92% 93%    Intake/Output Summary (Last 24 hours) at 09/08/15 1657 Last data filed at 09/08/15 0900  Gross per 24 hour  Intake    480 ml  Output    450 ml  Net     30 ml   Filed Weights   09/05/15 2150 09/07/15 2126  Weight: 77.6 kg (171 lb 1.2 oz) 73 kg (160 lb 15 oz)    Exam: Gen:  NAD, alert and awake Cardiovascular:  RRR. Chest and lungs:   CTAB, no wheezes Abdomen: Abdomen is soft with diffuse tenderness, extremely distended no rebound or guarding. Extremities:  Mild woody edema   Data Reviewed:    Labs: Basic Metabolic Panel:  Recent Labs Lab 09/06/15 0530 09/07/15 1703 09/07/15 2239 09/08/15 0533 09/08/15 1001  NA 117* 123* 123* 121* 125*  K 4.0 4.2 3.7 3.7 3.9   CL 82* 89* 90* 90* 90*  CO2 GLUCOSE 83 107* 107* 111* 118*  BUN CREATININE 1.25* 0.99 0.91 0.78 0.87  CALCIUM 8.2* 8.2* 8.0* 7.9* 8.0*   GFR Estimated Creatinine Clearance: 100.2 mL/min (by C-G formula based on Cr of 0.87). Liver Function Tests:  Recent Labs Lab 09/05/15 1841 09/06/15 0530  AST 105* 90*  ALT 38 39  ALKPHOS 99 96  BILITOT 3.5* 2.7*  PROT 5.5* 5.3*  ALBUMIN 2.0* 2.0*   No results for input(s): LIPASE, AMYLASE in the last 168 hours.  Recent Labs Lab 09/05/15 2114 09/06/15 0530  AMMONIA 64* 67*   Coagulation profile  Recent Labs Lab 09/05/15 2114  INR 1.45    CBC:  Recent Labs Lab 09/05/15 1841 09/06/15 0530 09/07/15 2239  WBC 20.4* 14.5* 9.2  NEUTROABS 16.5* 11.3*  --   HGB 13.5 13.2 13.2  HCT 37.1* 36.0* 36.4*  MCV 93.9 92.8 95.5  PLT 82* 65* 38*   Cardiac Enzymes: No results for input(s): CKTOTAL, CKMB, CKMBINDEX, TROPONINI in the last 168 hours. BNP (last 3 results) No results for input(s): PROBNP in the last 8760 hours. CBG: No results for input(s): GLUCAP in the last 168 hours. D-Dimer: No results for input(s): DDIMER in the last 72 hours. Hgb A1c: No results for input(s): HGBA1C in the last 72 hours. Lipid Profile: No results for input(s): CHOL, HDL, LDLCALC, TRIG, CHOLHDL, LDLDIRECT in the last 72 hours. Thyroid function studies: No results for input(s): TSH, T4TOTAL, T3FREE, THYROIDAB in the last 72 hours.  Invalid input(s): FREET3 Anemia work up: No results for input(s): VITAMINB12, FOLATE, FERRITIN, TIBC, IRON, RETICCTPCT in the last 72 hours. Sepsis Labs:  Recent Labs Lab 09/05/15 1841 09/06/15 0530 09/07/15 2239  WBC 20.4* 14.5* 9.2   Microbiology Recent Results (from the past 240 hour(s))  Culture, blood (routine x 2)     Status: None (Preliminary result)   Collection Time: 09/05/15  9:02 PM  Result Value Ref Range Status   Specimen Description BLOOD RIGHT ANTECUBITAL   Final   Special Requests BOTTLES DRAWN AEROBIC AND ANAEROBIC 5CC  Final   Culture NO GROWTH 3 DAYS  Final   Report Status PENDING  Incomplete  Culture, blood (routine x 2)     Status: None (Preliminary result)   Collection Time: 09/05/15  9:16 PM  Result Value Ref Range Status   Specimen Description BLOOD LEFT HAND  Final   Special Requests BOTTLES DRAWN AEROBIC AND ANAEROBIC 5CC  Final   Culture NO GROWTH 3 DAYS  Final  Report Status PENDING  Incomplete  Culture, body fluid-bottle     Status: None (Preliminary result)   Collection Time: 09/06/15 11:37 AM  Result Value Ref Range Status   Specimen Description FLUID PERITONEAL  Final   Special Requests NONE  Final   Culture NO GROWTH 2 DAYS  Final   Report Status PENDING  Incomplete  Gram stain     Status: None   Collection Time: 09/06/15 11:37 AM  Result Value Ref Range Status   Specimen Description FLUID PERITONEAL  Final   Special Requests NONE  Final   Gram Stain   Final    FEW WBC PRESENT,BOTH PMN AND MONONUCLEAR NO ORGANISMS SEEN    Report Status 09/06/2015 FINAL  Final     Medications:   . budesonide (PULMICORT) nebulizer solution  0.25 mg Nebulization BID  . cefTRIAXone (ROCEPHIN)  IV  2 g Intravenous Q24H  . folic acid  1 mg Oral Daily  . furosemide  40 mg Oral Daily  . ipratropium-albuterol  3 mL Nebulization BID  . lactulose  10 g Oral TID  . multivitamin with minerals  1 tablet Oral Daily  . nicotine  14 mg Transdermal Daily  . [START ON 09/09/2015] pantoprazole  40 mg Oral Daily  . potassium chloride SA  20 mEq Oral Daily  . sodium chloride  1 g Oral BID WC  . spironolactone  50 mg Oral Daily  . thiamine  100 mg Oral Daily   Continuous Infusions:   Time spent: 25 min   LOS: 3 days   [name]  [facility] Pager [phone]  *Please refer to [email or web address], password [email or web address] to get updated schedule on who will round on this patient, as hospitalists switch teams weekly. If 7PM-7AM, please contact  night-coverage at [email or web address], password [email or web address] for any overnight needs.  09/08/2015, 4:57 PM

## 2015-09-09 ENCOUNTER — Inpatient Hospital Stay (HOSPITAL_COMMUNITY): Payer: Medicaid Other

## 2015-09-09 LAB — CBC
HEMATOCRIT: 37.9 % — AB (ref 39.0–52.0)
HEMOGLOBIN: 13.5 g/dL (ref 13.0–17.0)
MCH: 34.8 pg — AB (ref 26.0–34.0)
MCHC: 35.6 g/dL (ref 30.0–36.0)
MCV: 97.7 fL (ref 78.0–100.0)
Platelets: 39 10*3/uL — ABNORMAL LOW (ref 150–400)
RBC: 3.88 MIL/uL — AB (ref 4.22–5.81)
RDW: 14.5 % (ref 11.5–15.5)
WBC: 10.1 10*3/uL (ref 4.0–10.5)

## 2015-09-09 LAB — BASIC METABOLIC PANEL
Anion gap: 10 (ref 5–15)
BUN: 13 mg/dL (ref 6–20)
CHLORIDE: 91 mmol/L — AB (ref 101–111)
CO2: 26 mmol/L (ref 22–32)
Calcium: 8.1 mg/dL — ABNORMAL LOW (ref 8.9–10.3)
Creatinine, Ser: 0.87 mg/dL (ref 0.61–1.24)
GFR calc Af Amer: >60 mL/min (ref >60)
GFR calc non Af Amer: >60 mL/min (ref >60)
Glucose, Bld: 108 mg/dL — ABNORMAL HIGH (ref 65–99)
POTASSIUM: 3.6 mmol/L (ref 3.5–5.1)
SODIUM: 127 mmol/L — AB (ref 135–145)

## 2015-09-09 LAB — TOTAL BILIRUBIN, BODY FLUID: Total bilirubin, fluid: 0.4 mg/dL

## 2015-09-09 MED ORDER — LIDOCAINE HCL (PF) 1 % IJ SOLN
INTRAMUSCULAR | Status: AC
  Filled 2015-09-09: qty 10

## 2015-09-09 NOTE — Evaluation (Signed)
Physical Therapy Evaluation Patient Details Name: [name] MRN: [id number] DOB: 1961/02/23 Today's Date: 09/09/2015   History of Present Illness  [name] is an 55 y.o. male with ongoing alcohol abuse and noncompliance the comes into the hospital because diffuse abdominal pain with nausea and vomiting  Clinical Impression  Pt admitted with above diagnosis. Pt currently with functional limitations due to the deficits listed below (see PT Problem List). Ambulates 150 feet with min guard assist for safety, using a rolling walker for support. SpO2 did drop to 87% on room air while OOB. Pt will benefit from skilled PT to increase their independence and safety with mobility to allow discharge to the venue listed below.       Follow Up Recommendations No PT follow up    Equipment Recommendations  Rolling walker with 5" wheels    Recommendations for Other Services OT consult     Precautions / Restrictions Precautions Precautions: Fall Precaution Comments: monitor O2 Restrictions Weight Bearing Restrictions: No      Mobility  Bed Mobility Overal bed mobility: Needs Assistance Bed Mobility: Supine to Sit     Supine to sit: Supervision     General bed mobility comments: Requires extra time, assist with equipment but able to self complete today.  Transfers Overall transfer level: Needs assistance Equipment used: Rolling walker (2 wheeled) Transfers: Sit to/from Stand Sit to Stand: Supervision         General transfer comment: supervision for safety. Slow to rise but steady with RW for support. Cues for hand placement.  Ambulation/Gait Ambulation/Gait assistance: Min guard Ambulation Distance (Feet): 150 Feet Assistive device: Rolling walker (2 wheeled) Gait Pattern/deviations: Step-through pattern;Decreased stride length;Trunk flexed Gait velocity: decreased Gait velocity interpretation: Below normal speed for age/gender General Gait Details: Educated on safe DME  use with a rolling walker for support. VC for upright posture and forward gaze, occasionally cues for walker placement closer to base of support. No buckling noted. SpO2 down to 87% on room air. Minimal dyspnea.  Stairs            Wheelchair Mobility    Modified Rankin (Stroke Patients Only)       Balance Overall balance assessment: Needs assistance Sitting-balance support: No upper extremity supported;Feet supported Sitting balance-Leahy Scale: Normal     Standing balance support: No upper extremity supported Standing balance-Leahy Scale: Fair                               Pertinent Vitals/Pain Pain Assessment: 0-10 Pain Score:  ("when I swallow" no value given) Pain Location: throat Pain Descriptors / Indicators: Sore Pain Intervention(s): Monitored during session;Repositioned    Home Living Family/patient expects to be discharged to:: Shelter/Homeless Living Arrangements: Other (Comment) (States none) Available Help at Discharge:  (States none - homeless) Type of Home: Homeless         Home Equipment: None Additional Comments: States he and his brother are homeless    Prior Function Level of Independence: Independent with assistive device(s)               Hand Dominance   Dominant Hand: Right    Extremity/Trunk Assessment   Upper Extremity Assessment: Defer to OT evaluation           Lower Extremity Assessment: Generalized weakness         Communication   Communication: HOH  Cognition Arousal/Alertness: Awake/alert Behavior During Therapy: WFL for tasks assessed/performed  Overall Cognitive Status: Within Functional Limits for tasks assessed                      General Comments General comments (skin integrity, edema, etc.): SpO2 on room air 93%, on 2L 93% (at rest) -- Ambulating on room air 87%    Exercises        Assessment/Plan    PT Assessment Patient needs continued PT services  PT Diagnosis  Difficulty walking;Abnormality of gait;Generalized weakness   PT Problem List Decreased strength;Decreased activity tolerance;Decreased balance;Decreased mobility;Decreased knowledge of use of DME;Cardiopulmonary status limiting activity  PT Treatment Interventions DME instruction;Gait training;Functional mobility training;Therapeutic activities;Therapeutic exercise;Balance training;Patient/family education   PT Goals (Current goals can be found in the Care Plan section) Acute Rehab PT Goals Patient Stated Goal: Walk better PT Goal Formulation: With patient Time For Goal Achievement: 09/23/15 Potential to Achieve Goals: Fair    Frequency Min 3X/week   Barriers to discharge Inaccessible home environment homeless    Co-evaluation               End of Session Equipment Utilized During Treatment: Gait belt Activity Tolerance: Patient tolerated treatment well Patient left: in bed;with call bell/phone within reach;with bed alarm set Nurse Communication: Mobility status (sats)         Time: 1610-9604 PT Time Calculation (min) (ACUTE ONLY): 21 min   Charges:   PT Evaluation $PT Eval Low Complexity: 1 Procedure     PT G CodesBerton Mount 09/09/2015, 9:39 AM  Charlsie Merles, PT [phone]

## 2015-09-09 NOTE — Evaluation (Signed)
Clinical/Bedside Swallow Evaluation Patient Details  Name: Bryce MawRobert Ziller MRN: [id number] Date of Birth: 19-Apr-1961  Today's Date: 09/09/2015 Time: SLP Start Time (ACUTE ONLY): 1448 SLP Stop Time (ACUTE ONLY): 1502 SLP Time Calculation (min) (ACUTE ONLY): 14 min  Past Medical History:  Past Medical History  Diagnosis Date  . ETOH abuse   . Cirrhosis of liver (HCC)   . COPD (chronic obstructive pulmonary disease) (HCC)    Past Surgical History:  Past Surgical History  Procedure Laterality Date  . No past surgeries      jaw surgery 655yrs ago   HPI:  [name] is an 55 y.o. male with ongoing alcohol abuse and noncompliance the comes into the hospital because of diffuse abdominal pain with nausea and vomiting.  Hx of SBP, alcoholic cirrhosis of liver with ascites, protein-cal malnutrition, chronic resp failure.  Lungs are CTA, no wheezes.    Assessment / Plan / Recommendation Clinical Impression  Pt exhibited no overt s/s of oropharyngeal dysphagia. Pt frequently c/o globus sensation and odynophagia, possibly indicative of esophageal dysphagia. Per pt report, pain in throat increased during intake and wakes up when sleeping due to cough. Pt and brother [name] educated re: esophageal precautions (sit upright during intake, remain upright >30 minutes after intake, alternate solids and liquids) as well as recommendation for esophageal testing. No further SLP intervention warranted at this time. Pt will need esophageal assessment to evaluate motility disorder and/or stricture.    Aspiration Risk  Mild aspiration risk    Diet Recommendation Regular;Thin liquid   Liquid Administration via: Cup;Straw Medication Administration: Whole meds with liquid Supervision: Patient able to self feed Compensations: Minimize environmental distractions;Slow rate;Small sips/bites;Follow solids with liquid Postural Changes: Seated upright at 90 degrees    Other  Recommendations Recommended Consults:  Consider esophageal assessment Oral Care Recommendations: Oral care BID   Follow up Recommendations  None    Frequency and Duration            Prognosis Prognosis for Safe Diet Advancement: Good      Swallow Study   General HPI: Bryce MawRobert Idrovo is an 55 y.o. male with ongoing alcohol abuse and noncompliance the comes into the hospital because of diffuse abdominal pain with nausea and vomiting.  Hx of SBP, alcoholic cirrhosis of liver with ascites, protein-cal malnutrition, chronic resp failure.  Lungs are CTA, no wheezes.  Type of Study: Bedside Swallow Evaluation Previous Swallow Assessment: none found Diet Prior to this Study: Regular;Thin liquids Temperature Spikes Noted: No Respiratory Status: Nasal cannula History of Recent Intubation: No Behavior/Cognition: Alert;Cooperative Oral Cavity Assessment: Within Functional Limits Oral Care Completed by SLP: No Oral Cavity - Dentition: Adequate natural dentition;Missing dentition Vision: Functional for self-feeding Self-Feeding Abilities: Able to feed self Patient Positioning: Upright in chair Baseline Vocal Quality: Normal Volitional Cough: Strong Volitional Swallow: Able to elicit    Oral/Motor/Sensory Function Overall Oral Motor/Sensory Function: Within functional limits   Ice Chips Ice chips: Not tested   Thin Liquid Thin Liquid: Within functional limits Presentation: Cup;Self Fed    Nectar Thick Nectar Thick Liquid: Not tested   Honey Thick Honey Thick Liquid: Not tested   Puree Puree: Within functional limits Presentation: Self Fed;Spoon   Solid   Lynita LombardLauren Monterius Rolf, Student-SLP Solid: Not tested        Lynita LombardLauren Danee Soller 09/09/2015,3:15 PM

## 2015-09-09 NOTE — Procedures (Signed)
Successful US guided paracentesis from RLQ.  Yielded 7.2L of clear yellow fluid.  No immediate complications.  Pt tolerated well.   Specimen was not sent for labs.  Brayton ElBRUNING, Yoskar Murrillo PA-C 09/09/2015 4:13 PM

## 2015-09-09 NOTE — Progress Notes (Addendum)
[facility] PROGRESS NOTE    Progress Note   Bryce MawRobert Sizelove NWG:[id number] DOB: 02/03/1961 DOA: 09/05/2015 PCP: Jaclyn ShaggyEnobong, [name], MD   Brief Narrative:   [name] is an 55 y.o. male with ongoing alcohol abuse and noncompliance the comes into the hospital because diffuse abdominal pain with nausea and vomiting, he denies any fever or chills. He did not get his paracentesis last week.  Assessment/Plan:   Diffuse abdominal pain in the setting of Ascites/? Due to SBP (spontaneous bacterial peritonitis) (HCC): Addendum: Add lasix S/p ultrasound-guided paracentesis, will send for cytology cell count and stain and cultures. Continue spironolactone and agree with IV antibiotic regimen. Patient has not been compliant with diet. has remained afebrile with improvement in his leukocytosis. - obtain cbc next am. - Still distended as such will place order for therapeutic paracentesis - no malignant cells noted on cytology of prior paracentesis  Chronic Thrombocytopenia (HCC) Baseline continue to monitor.  Alcoholic cirrhosis of liver with ascites (HCC) Continue Aldactone, restart Lasix. He relates he continues to drink alcohol.  Hyponatremia with excess extracellular fluid volume: Most likely due to hypervolemic hyponatremia given history of cirrhosis. Patient will need diuresis  Protein-calorie malnutrition (HCC) Ensure 3 times a day.    Chronic respiratory failure (HCC) Cont oxygen.  Hypocalcemia: Corrected for his albumin calcium is within normal.    DVT Prophylaxis - Lovenox ordered.  Family Communication: none Disposition Plan: With improvement in abdominal discomfort. Discharge most likely next am. Code Status:     Code Status Orders        Start     Ordered   09/05/15 2028  Full code   Continuous     09/05/15 2031    Code Status History    Date Active Date Inactive Code Status Order ID Comments User Context   04/21/2015  4:42 AM 04/25/2015  3:53 PM Full Code  [id number]  Clydie Braunondell A Smith, MD Inpatient   04/10/2015 10:34 PM 04/15/2015  5:19 PM Full Code [id number]  Eston EstersAhmad Hamad, MD ED   03/28/2015  6:23 AM 03/29/2015  4:24 PM Full Code [id number]  Rolly SalterPranav M Patel, MD Inpatient   03/12/2015  3:59 PM 03/15/2015  8:08 PM Full Code [id number]  Jerald KiefStephen K Chiu, MD ED   08/25/2013  4:16 PM 08/29/2013  5:32 PM Full Code [id number]  Sanjuana KavaAgnes I Nwoko, NP Inpatient   08/25/2013 12:36 AM 08/25/2013  4:16 PM Full Code [id number]  Gavin PoundMichael Y. Ghim, MD ED        IV Access:    Peripheral IV   Procedures and diagnostic studies:   No results found.   Medical Consultants:    None.  Anti-Infectives:   Anti-infectives    Start     Dose/Rate Route Frequency Ordered Stop   09/05/15 2030  cefTRIAXone (ROCEPHIN) 2 g in dextrose 5 % 50 mL IVPB     2 g 100 mL/hr over 30 Minutes Intravenous Every 24 hours 09/05/15 2023        Subjective:    Bryce MawRobert Yiu continues to complain of abdominal pain secondary to fullness.  Objective:    Filed Vitals:   09/08/15 1741 09/08/15 1924 09/08/15 1949 09/09/15 0609  BP: 93/73 90/61  98/67  Pulse: 95 95 91 90  Temp:  98 F (36.7 C)  97.9 F (36.6 C)  TempSrc:  Oral  Oral  Resp: 18 18 16 18   Height:      Weight:  69.9 kg (154 lb 1.6 oz)    SpO2:  95% 96% 96% 94%    Intake/Output Summary (Last 24 hours) at 09/09/15 0908 Last data filed at 09/09/15 0454  Gross per 24 hour  Intake      0 ml  Output    675 ml  Net   -675 ml   Filed Weights   09/05/15 2150 09/07/15 2126 09/08/15 1924  Weight: 77.6 kg (171 lb 1.2 oz) 73 kg (160 lb 15 oz) 69.9 kg (154 lb 1.6 oz)    Exam: Gen:  NAD, alert and awake Cardiovascular:  RRR, no increased wob Chest and lungs:   CTAB, no wheezes Abdomen: Abdomen is soft with diffuse tenderness,  distended no rebound or guarding. Extremities:  Mild woody edema   Data Reviewed:    Labs: Basic Metabolic Panel:  Recent Labs Lab 09/07/15 1703 09/07/15 2239 09/08/15 0533  09/08/15 1001 09/08/15 1611  NA 123* 123* 121* 125* 124*  K 4.2 3.7 3.7 3.9 4.1  CL 89* 90* 90* 90* 89*  CO2 GLUCOSE 107* 107* 111* 118* 130*  BUN CREATININE 0.99 0.91 0.78 0.87 0.84  CALCIUM 8.2* 8.0* 7.9* 8.0* 8.1*   GFR Estimated Creatinine Clearance: 99.4 mL/min (by C-G formula based on Cr of 0.84). Liver Function Tests:  Recent Labs Lab 09/05/15 1841 09/06/15 0530  AST 105* 90*  ALT 38 39  ALKPHOS 99 96  BILITOT 3.5* 2.7*  PROT 5.5* 5.3*  ALBUMIN 2.0* 2.0*   No results for input(s): LIPASE, AMYLASE in the last 168 hours.  Recent Labs Lab 09/05/15 2114 09/06/15 0530  AMMONIA 64* 67*   Coagulation profile  Recent Labs Lab 09/05/15 2114  INR 1.45    CBC:  Recent Labs Lab 09/05/15 1841 09/06/15 0530 09/07/15 2239 09/09/15 0014  WBC 20.4* 14.5* 9.2 10.1  NEUTROABS 16.5* 11.3*  --   --   HGB 13.5 13.2 13.2 13.5  HCT 37.1* 36.0* 36.4* 37.9*  MCV 93.9 92.8 95.5 97.7  PLT 82* 65* 38* 39*   Cardiac Enzymes: No results for input(s): CKTOTAL, CKMB, CKMBINDEX, TROPONINI in the last 168 hours. BNP (last 3 results) No results for input(s): PROBNP in the last 8760 hours. CBG: No results for input(s): GLUCAP in the last 168 hours. D-Dimer: No results for input(s): DDIMER in the last 72 hours. Hgb A1c: No results for input(s): HGBA1C in the last 72 hours. Lipid Profile: No results for input(s): CHOL, HDL, LDLCALC, TRIG, CHOLHDL, LDLDIRECT in the last 72 hours. Thyroid function studies: No results for input(s): TSH, T4TOTAL, T3FREE, THYROIDAB in the last 72 hours.  Invalid input(s): FREET3 Anemia work up: No results for input(s): VITAMINB12, FOLATE, FERRITIN, TIBC, IRON, RETICCTPCT in the last 72 hours. Sepsis Labs:  Recent Labs Lab 09/05/15 1841 09/06/15 0530 09/07/15 2239 09/09/15 0014  WBC 20.4* 14.5* 9.2 10.1   Microbiology Recent Results (from the past 240 hour(s))  Culture, blood (routine x 2)     Status:  None (Preliminary result)   Collection Time: 09/05/15  9:02 PM  Result Value Ref Range Status   Specimen Description BLOOD RIGHT ANTECUBITAL  Final   Special Requests BOTTLES DRAWN AEROBIC AND ANAEROBIC 5CC  Final   Culture NO GROWTH 3 DAYS  Final   Report Status PENDING  Incomplete  Culture, blood (routine x 2)     Status: None (Preliminary result)   Collection Time: 09/05/15  9:16 PM  Result Value Ref Range Status   Specimen Description BLOOD LEFT HAND  Final   Special Requests BOTTLES DRAWN AEROBIC AND ANAEROBIC 5CC  Final   Culture NO GROWTH 3 DAYS  Final   Report Status PENDING  Incomplete  Culture, body fluid-bottle     Status: None (Preliminary result)   Collection Time: 09/06/15 11:37 AM  Result Value Ref Range Status   Specimen Description FLUID PERITONEAL  Final   Special Requests NONE  Final   Culture NO GROWTH 2 DAYS  Final   Report Status PENDING  Incomplete  Gram stain     Status: None   Collection Time: 09/06/15 11:37 AM  Result Value Ref Range Status   Specimen Description FLUID PERITONEAL  Final   Special Requests NONE  Final   Gram Stain   Final    FEW WBC PRESENT,BOTH PMN AND MONONUCLEAR NO ORGANISMS SEEN    Report Status 09/06/2015 FINAL  Final     Medications:   . budesonide (PULMICORT) nebulizer solution  0.25 mg Nebulization BID  . cefTRIAXone (ROCEPHIN)  IV  2 g Intravenous Q24H  . folic acid  1 mg Oral Daily  . furosemide  40 mg Oral Daily  . ipratropium-albuterol  3 mL Nebulization BID  . lactulose  10 g Oral TID  . multivitamin with minerals  1 tablet Oral Daily  . nicotine  14 mg Transdermal Daily  . pantoprazole  40 mg Oral Daily  . potassium chloride SA  20 mEq Oral Daily  . spironolactone  50 mg Oral Daily  . thiamine  100 mg Oral Daily   Continuous Infusions:   Time spent: 25 min   LOS: 4 days   [name]  [facility] Pager [phone]  *Please refer to [email or web address], password [email or web address] to get updated schedule on who will round  on this patient, as hospitalists switch teams weekly. If 7PM-7AM, please contact night-coverage at [email or web address], password [email or web address] for any overnight needs.  09/09/2015, 9:08 AM

## 2015-09-09 NOTE — Progress Notes (Addendum)
Patient wanting to know if he was going to be scheduled for another paracenteses.  Abdomen distended and patient complaining of fullness.  MD notified.

## 2015-09-10 ENCOUNTER — Emergency Department (HOSPITAL_COMMUNITY): Payer: Medicaid Other

## 2015-09-10 ENCOUNTER — Emergency Department (HOSPITAL_COMMUNITY)
Admission: EM | Admit: 2015-09-10 | Discharge: 2015-09-10 | Disposition: A | Discharge status: Home / Self Care | Payer: Medicaid Other | Attending: Emergency Medicine | Admitting: Emergency Medicine

## 2015-09-10 ENCOUNTER — Encounter (HOSPITAL_COMMUNITY): Payer: Self-pay | Admitting: Emergency Medicine

## 2015-09-10 DIAGNOSIS — Z79899 Other long term (current) drug therapy: Secondary | ICD-10-CM | POA: Insufficient documentation

## 2015-09-10 DIAGNOSIS — E860 Dehydration: Secondary | ICD-10-CM | POA: Insufficient documentation

## 2015-09-10 DIAGNOSIS — F17210 Nicotine dependence, cigarettes, uncomplicated: Secondary | ICD-10-CM | POA: Insufficient documentation

## 2015-09-10 DIAGNOSIS — I9581 Postprocedural hypotension: Secondary | ICD-10-CM | POA: Diagnosis not present

## 2015-09-10 DIAGNOSIS — Z8719 Personal history of other diseases of the digestive system: Secondary | ICD-10-CM | POA: Insufficient documentation

## 2015-09-10 DIAGNOSIS — R600 Localized edema: Secondary | ICD-10-CM | POA: Insufficient documentation

## 2015-09-10 DIAGNOSIS — M545 Low back pain: Secondary | ICD-10-CM | POA: Insufficient documentation

## 2015-09-10 DIAGNOSIS — J449 Chronic obstructive pulmonary disease, unspecified: Secondary | ICD-10-CM | POA: Insufficient documentation

## 2015-09-10 LAB — CULTURE, BLOOD (ROUTINE X 2)
CULTURE: NO GROWTH
Culture: NO GROWTH

## 2015-09-10 LAB — CBC WITH DIFFERENTIAL/PLATELET
BASOS ABS: 0 10*3/uL (ref 0.0–0.1)
BASOS PCT: 0 %
Eosinophils Absolute: 0.1 10*3/uL (ref 0.0–0.7)
Eosinophils Relative: 1 %
HEMATOCRIT: 40.3 % (ref 39.0–52.0)
HEMOGLOBIN: 14.6 g/dL (ref 13.0–17.0)
LYMPHS PCT: 12 %
Lymphs Abs: 1.7 10*3/uL (ref 0.7–4.0)
MCH: 34.5 pg — ABNORMAL HIGH (ref 26.0–34.0)
MCHC: 36.2 g/dL — ABNORMAL HIGH (ref 30.0–36.0)
MCV: 95.3 fL (ref 78.0–100.0)
MONO ABS: 1.8 10*3/uL — AB (ref 0.1–1.0)
Monocytes Relative: 13 %
NEUTROS ABS: 10.3 10*3/uL — AB (ref 1.7–7.7)
NEUTROS PCT: 75 %
Platelets: 47 10*3/uL — ABNORMAL LOW (ref 150–400)
RBC: 4.23 MIL/uL (ref 4.22–5.81)
RDW: 14.5 % (ref 11.5–15.5)
WBC: 13.8 10*3/uL — AB (ref 4.0–10.5)

## 2015-09-10 LAB — COMPREHENSIVE METABOLIC PANEL
ALBUMIN: 2.2 g/dL — AB (ref 3.5–5.0)
ALK PHOS: 89 U/L (ref 38–126)
ALT: 32 U/L (ref 17–63)
AST: 51 U/L — AB (ref 15–41)
Anion gap: 10 (ref 5–15)
BILIRUBIN TOTAL: 2.4 mg/dL — AB (ref 0.3–1.2)
BUN: 18 mg/dL (ref 6–20)
CO2: 22 mmol/L (ref 22–32)
CREATININE: 0.95 mg/dL (ref 0.61–1.24)
Calcium: 8 mg/dL — ABNORMAL LOW (ref 8.9–10.3)
Chloride: 92 mmol/L — ABNORMAL LOW (ref 101–111)
GFR calc Af Amer: >60 mL/min (ref >60)
GLUCOSE: 123 mg/dL — AB (ref 65–99)
POTASSIUM: 3.4 mmol/L — AB (ref 3.5–5.1)
Sodium: 124 mmol/L — ABNORMAL LOW (ref 135–145)
TOTAL PROTEIN: 5.4 g/dL — AB (ref 6.5–8.1)

## 2015-09-10 LAB — AMMONIA: Ammonia: 37 umol/L — ABNORMAL HIGH (ref 9–35)

## 2015-09-10 LAB — I-STAT CG4 LACTIC ACID, ED: LACTIC ACID, VENOUS: 1.94 mmol/L (ref 0.5–2.0)

## 2015-09-10 LAB — I-STAT TROPONIN, ED: TROPONIN I, POC: 0.01 ng/mL (ref 0.00–0.08)

## 2015-09-10 LAB — ETHANOL: Alcohol, Ethyl (B): <5 mg/dL (ref <5)

## 2015-09-10 MED ORDER — THIAMINE HCL 100 MG PO TABS: 100.0000 mg | ORAL_TABLET | Freq: Every day | ORAL | Status: DC

## 2015-09-10 MED ORDER — FUROSEMIDE 20 MG PO TABS: 20.0000 mg | ORAL_TABLET | Freq: Every day | ORAL | Status: DC

## 2015-09-10 MED ORDER — SODIUM CHLORIDE 0.9 % IV BOLUS (SEPSIS)
500.0000 mL | Freq: Once | INTRAVENOUS | Status: AC
  Administered 2015-09-10: 500 mL via INTRAVENOUS

## 2015-09-10 MED ORDER — ADULT MULTIVITAMIN W/MINERALS CH: 1.0000 | ORAL_TABLET | Freq: Every day | ORAL | Status: DC

## 2015-09-10 NOTE — Discharge Summary (Signed)
Physician Discharge Summary  Bryce MawRobert [name] BJY:[id number] DOB: 01-14-1961 DOA: 09/05/2015  PCP: Jaclyn ShaggyEnobong, [name], MD  Admit date: 09/05/2015 Discharge date: 09/10/2015  Time spent: > 35 minutes  Recommendations for Outpatient Follow-up:  1. Monitor sodium levels. Elevated due to hypervolemia from liver cirrhosis  2. Will d/c on low dose lasix to prevent fast reacumulation of ascites    Discharge Diagnoses:  Active Problems:   Thrombocytopenia (HCC)   Alcoholic cirrhosis of liver with ascites (HCC)   Hyponatremia with excess extracellular fluid volume   Protein-calorie malnutrition (HCC)   Ascites   SBP (spontaneous bacterial peritonitis) (HCC)   Chronic respiratory failure (HCC)   Hypocalcemia   Diffuse abdominal pain   Discharge Condition: stable  Diet recommendation: heart healthy diet  Filed Weights   09/05/15 2150 09/07/15 2126 09/08/15 1924  Weight: 77.6 kg (171 lb 1.2 oz) 73 kg (160 lb 15 oz) 69.9 kg (154 lb 1.6 oz)    History of present illness:  [name] is an 55 y.o. male with ongoing alcohol abuse and noncompliance the comes into the hospital because diffuse abdominal pain with nausea and vomiting,  Hospital Course:  Diffuse Abdominal discomfort - most likely due to amount of ascitic fluid build up. While in house patient had 2 paracentesis with the last one yielding 7.3 L of clear fluid. - Patient was treated for possible SBP, but gram stain was negative, only few PMN reported, and culture was negative. As such will d/c off antibiotics. Of note patient afebrile   Liver Cirrhosis - Will add small dose of lasix - Continue spironolactone.  - Recommended patient follow up with pcp or GI specialist for continued recommendations  Hyponatremia - hypervolemic hyponatremia. Should improve with continued diuresis  Procedures:  Paracentesis yielding > 12 liter (on 2 separate occasions)  Consultations:  IR  Discharge Exam: Filed Vitals:   09/10/15 0600 09/10/15  0840  BP: 94/62 93/70  Pulse: 80 86  Temp:  97.7 F (36.5 C)  Resp:  18    General: Pt in nad, alert and awake Cardiovascular: rrr, no rubs Respiratory: no increased wob, no wheezes  Discharge Instructions   Discharge Instructions    Call MD for:  persistant dizziness or light-headedness    Complete by:  As directed      Call MD for:  severe uncontrolled pain    Complete by:  As directed      Call MD for:  temperature >100.4    Complete by:  As directed      Diet - low sodium heart healthy    Complete by:  As directed      Discharge instructions    Complete by:  As directed   Please follow up with your primary care physician within the next 1 weeks for further evaluation and recommendations.     Increase activity slowly    Complete by:  As directed           Current Discharge Medication List    START taking these medications   Details  furosemide (LASIX) 20 MG tablet Take 1 tablet (20 mg total) by mouth daily. Qty: 30 tablet, Refills: 0    Multiple Vitamin (MULTIVITAMIN WITH MINERALS) TABS tablet Take 1 tablet by mouth daily.      CONTINUE these medications which have CHANGED   Details  thiamine 100 MG tablet Take 1 tablet (100 mg total) by mouth daily. Qty: 30 tablet, Refills: 0      CONTINUE these medications which  have NOT CHANGED   Details  albuterol (PROVENTIL HFA;VENTOLIN HFA) 108 (90 Base) MCG/ACT inhaler Inhale 2 puffs into the lungs every 6 (six) hours as needed for wheezing or shortness of breath. Qty: 54 g, Refills: 3    lactulose (CHRONULAC) 10 GM/15ML solution Take 15 mLs (10 g total) by mouth 2 (two) times daily. Qty: 946 mL, Refills: 2   Associated Diagnoses: Alcoholic cirrhosis of liver with ascites (HCC)    pantoprazole (PROTONIX) 40 MG tablet Take 1 tablet (40 mg total) by mouth daily. Qty: 30 tablet, Refills: 2   Associated Diagnoses: Alcoholic cirrhosis of liver with ascites (HCC)    potassium chloride SA (K-DUR,KLOR-CON) 20 MEQ tablet  Take 1 tablet (20 mEq total) by mouth daily. Qty: 30 tablet, Refills: 2    spironolactone (ALDACTONE) 50 MG tablet Take 1 tablet (50 mg total) by mouth daily. Qty: 30 tablet, Refills: 2   Associated Diagnoses: Alcoholic cirrhosis of liver with ascites (HCC)      STOP taking these medications     folic acid (FOLVITE) 1 MG tablet        No Known Allergies    The results of significant diagnostics from this hospitalization (including imaging, microbiology, ancillary and laboratory) are listed below for reference.    Significant Diagnostic Studies: Dg Chest 2 View  09/05/2015  CLINICAL DATA:  Nausea, vomiting, diarrhea for 3 days EXAM: CHEST  2 VIEW COMPARISON:  06/07/2015 FINDINGS: There is left lower lobe airspace disease. There is no pleural effusion or pneumothorax. The heart and mediastinal contours are unremarkable. The osseous structures are unremarkable. IMPRESSION: Left lower lobe pneumonia. Followup PA and lateral chest X-ray is recommended in 3-4 weeks following trial of antibiotic therapy to ensure resolution and exclude underlying malignancy. Electronically Signed   By: Elige Ko   On: 09/05/2015 20:31   US Paracentesis  09/09/2015  INDICATION: Abdominal distention secondary to recurrent ascites. Request therapeutic paracentesis. EXAM: ULTRASOUND GUIDED RIGHT LOWER QUADRANT PARACENTESIS MEDICATIONS: None. COMPLICATIONS: None immediate. PROCEDURE: Informed written consent was obtained from the patient after a discussion of the risks, benefits and alternatives to treatment. A timeout was performed prior to the initiation of the procedure. Initial ultrasound scanning demonstrates a large amount of ascites within the right lower abdominal quadrant. The right lower abdomen was prepped and draped in the usual sterile fashion. 1% lidocaine with epinephrine was used for local anesthesia. Following this, a Safe-T-Centesis catheter was introduced. An ultrasound image was saved for  documentation purposes. The paracentesis was performed. The catheter was removed and a dressing was applied. The patient tolerated the procedure well without immediate post procedural complication. FINDINGS: A total of approximately 7.2 L of clear yellow fluid was removed. IMPRESSION: Successful ultrasound-guided paracentesis yielding 7.2 liters of peritoneal fluid. Read by: Brayton El PA-C Electronically Signed   By: Irish Lack M.D.   On: 09/09/2015 16:12   US Paracentesis  09/06/2015  INDICATION: ascites EXAM: ULTRASOUND-GUIDED PARACENTESIS COMPARISON:  Previous para MEDICATIONS: 10 cc 1% lidocaine COMPLICATIONS: None immediate. TECHNIQUE: Informed written consent was obtained from the patient after a discussion of the risks, benefits and alternatives to treatment. A timeout was performed prior to the initiation of the procedure. Initial ultrasound scanning demonstrates a large amount of ascites within the right lower abdominal quadrant. The right lower abdomen was prepped and draped in the usual sterile fashion. 1% lidocaine with epinephrine was used for local anesthesia. Under direct ultrasound guidance, a 19 gauge, 7-cm, Yueh catheter was introduced. An ultrasound  image was saved for documentation purposed. The paracentesis was performed. The catheter was removed and a dressing was applied. The patient tolerated the procedure well without immediate post procedural complication. FINDINGS: A total of approximately 5.2 liters of yellow fluid was removed. Samples were sent to the laboratory as requested by the clinical team. IMPRESSION: Successful ultrasound-guided paracentesis yielding 5.2 liters of peritoneal fluid. Maximum withdrawn per MD Read by:  Robet Leu [facility] Electronically Signed   By: Judie Petit.  Shick M.D.   On: 09/06/2015 11:32   US Paracentesis  08/23/2015  INDICATION: Cirrhosis with recurrent ascites. Presents today for a therapeutic paracentesis. EXAM: ULTRASOUND GUIDED THERAPEUTIC  PARACENTESIS MEDICATIONS: 1% lidocaine COMPLICATIONS: None immediate. PROCEDURE: Informed written consent was obtained from the patient after a discussion of the risks, benefits and alternatives to treatment. A timeout was performed prior to the initiation of the procedure. Initial ultrasound scanning demonstrates a large amount of ascites within the right lower abdominal quadrant. The right lower abdomen was prepped and draped in the usual sterile fashion. 1% lidocaine was used for local anesthesia. Following this, a 19 gauge, 7-cm, Yueh catheter was introduced. An ultrasound image was saved for documentation purposes. The paracentesis was performed. The catheter was removed and a dressing was applied. The patient tolerated the procedure well without immediate post procedural complication. FINDINGS: A total of approximately 7 L of yellow fluid was removed. IMPRESSION: Successful ultrasound-guided paracentesis yielding 7 liters of peritoneal fluid. The patient went to short-stay following his procedure to receive 25 g of albumin. Read by: Barnetta Chapel, PA-C Electronically Signed   By: Irish Lack M.D.   On: 08/23/2015 13:11   US Paracentesis  08/16/2015  INDICATION: Decompensated cirrhosis with recurrent ascites. Patient presents for his weekly therapeutic paracentesis. EXAM: ULTRASOUND GUIDED THERAPEUTIC PARACENTESIS MEDICATIONS: 1% lidocaine COMPLICATIONS: None immediate. PROCEDURE: Informed written consent was obtained from the patient after a discussion of the risks, benefits and alternatives to treatment. A timeout was performed prior to the initiation of the procedure. Initial ultrasound scanning demonstrates a large amount of ascites within the right lower abdominal quadrant. The right lower abdomen was prepped and draped in the usual sterile fashion. 1% lidocaine was used for local anesthesia. Following this, a 19 gauge, 7-cm, Yueh catheter was introduced. The paracentesis was performed. The catheter  was removed and a dressing was applied. The patient tolerated the procedure well without immediate post procedural complication. FINDINGS: A total of approximately 7 L of clear yellow fluid was removed. IMPRESSION: Successful ultrasound-guided paracentesis yielding 7 liters of peritoneal fluid. The patient went to short-stay following the procedure and received 25 g of albumin. Read by: Barnetta Chapel, PA-C Electronically Signed   By: Corlis Leak M.D.   On: 08/16/2015 12:36    Microbiology: Recent Results (from the past 240 hour(s))  Culture, blood (routine x 2)     Status: None (Preliminary result)   Collection Time: 09/05/15  9:02 PM  Result Value Ref Range Status   Specimen Description BLOOD RIGHT ANTECUBITAL  Final   Special Requests BOTTLES DRAWN AEROBIC AND ANAEROBIC 5CC  Final   Culture NO GROWTH 4 DAYS  Final   Report Status PENDING  Incomplete  Culture, blood (routine x 2)     Status: None (Preliminary result)   Collection Time: 09/05/15  9:16 PM  Result Value Ref Range Status   Specimen Description BLOOD LEFT HAND  Final   Special Requests BOTTLES DRAWN AEROBIC AND ANAEROBIC 5CC  Final   Culture NO GROWTH  4 DAYS  Final   Report Status PENDING  Incomplete  Culture, body fluid-bottle     Status: None (Preliminary result)   Collection Time: 09/06/15 11:37 AM  Result Value Ref Range Status   Specimen Description FLUID PERITONEAL  Final   Special Requests NONE  Final   Culture NO GROWTH 3 DAYS  Final   Report Status PENDING  Incomplete  Gram stain     Status: None   Collection Time: 09/06/15 11:37 AM  Result Value Ref Range Status   Specimen Description FLUID PERITONEAL  Final   Special Requests NONE  Final   Gram Stain   Final    FEW WBC PRESENT,BOTH PMN AND MONONUCLEAR NO ORGANISMS SEEN    Report Status 09/06/2015 FINAL  Final     Labs: Basic Metabolic Panel:  Recent Labs Lab 09/07/15 2239 09/08/15 0533 09/08/15 1001 09/08/15 1611 09/09/15 0943  NA 123* 121* 125*  124* 127*  K 3.7 3.7 3.9 4.1 3.6  CL 90* 90* 90* 89* 91*  CO2 GLUCOSE 107* 111* 118* 130* 108*  BUN CREATININE 0.91 0.78 0.87 0.84 0.87  CALCIUM 8.0* 7.9* 8.0* 8.1* 8.1*   Liver Function Tests:  Recent Labs Lab 09/05/15 1841 09/06/15 0530  AST 105* 90*  ALT 38 39  ALKPHOS 99 96  BILITOT 3.5* 2.7*  PROT 5.5* 5.3*  ALBUMIN 2.0* 2.0*   No results for input(s): LIPASE, AMYLASE in the last 168 hours.  Recent Labs Lab 09/05/15 2114 09/06/15 0530  AMMONIA 64* 67*   CBC:  Recent Labs Lab 09/05/15 1841 09/06/15 0530 09/07/15 2239 09/09/15 0014  WBC 20.4* 14.5* 9.2 10.1  NEUTROABS 16.5* 11.3*  --   --   HGB 13.5 13.2 13.2 13.5  HCT 37.1* 36.0* 36.4* 37.9*  MCV 93.9 92.8 95.5 97.7  PLT 82* 65* 38* 39*   Cardiac Enzymes: No results for input(s): CKTOTAL, CKMB, CKMBINDEX, TROPONINI in the last 168 hours. BNP: BNP (last 3 results) No results for input(s): BNP in the last 8760 hours.  ProBNP (last 3 results) No results for input(s): PROBNP in the last 8760 hours.  CBG: No results for input(s): GLUCAP in the last 168 hours.   Signed:  [name] MD.  [facility] 09/10/2015, 10:43 AM

## 2015-09-10 NOTE — ED Notes (Signed)
Ambulated in hallway with walker with success

## 2015-09-10 NOTE — Progress Notes (Signed)
[facility] to [facility]  Patient is homeless.  [facility] to speak to patient regarding homelessness.  EDCM spoke to patient at bedside.  Patient reports he is "staying on the street."  He confirms [facility].  [facility] him with the [facility] of his medications.  Patient reports having difficulty with transportation to his appointments.  EDCM [facility]'s [facility] and need for follow up appointment.  EDSW [facility].  [facility].  [facility].  Patient to be given bus passes.  Patient thankful for services.  No further EDCM needs at this time.  Discussed patient with EDP.

## 2015-09-10 NOTE — Progress Notes (Signed)
Re-assessed O2 and patient is at 96% room air

## 2015-09-10 NOTE — ED Notes (Signed)
[facility] for weakness and cirrhosis. Pt has some jaundice to his abdomen. Pt is alert and oriented x 4. Pt was hypotensive with EMS who first got a bp of 78/60 with a heartrate of 120. EMS administered a 500ml bolus which led to a bp of 103/82 and a heartrate of 96. Pt reports weakness in both legs that increased after his dc this AM

## 2015-09-10 NOTE — ED Notes (Signed)
[facility] Expected date:  Expected time:  Means of arrival:  Comments: EMS 54yo M leg weakness just dc'd today for same

## 2015-09-10 NOTE — Progress Notes (Signed)
PT Cancellation Note  Patient Details Name: Libby MawRobert Leiphart MRN: [id number] DOB: [birth year]   Cancelled Treatment:    Reason Eval/Treat Not Completed: Other (comment).  Pt is imminently discharging.     Rollene Rotundaebecca B. Orvilla Truett, PT, DPT [phone]   09/10/2015, 2:37 PM

## 2015-09-10 NOTE — Progress Notes (Signed)
Oxygen [place] removed at this time, will re-assess status  Periodically. Patient requested that he would like to have lunch before discharging. Will re-assess O2 hourly before d/c

## 2015-09-10 NOTE — Care Management Note (Addendum)
Case Management Note  Patient Details  Name: [name] MRN: [id number] Date of Birth: [birth year]  Subjective/Objective:          CM following for progression and d/c planning.          Action/Plan: 09/10/2015 Met with pt re d/c needs, rolling walker ordered, CSW, [place] will provide a bus pass. RN attempting to wean oxygen.   Expected Discharge Date:  09/10/15               Expected Discharge Plan:  Home/Self Care  In-House Referral:  Clinical Social Work  Discharge planning Services  CM Consult  Post Acute Care Choice:  Durable Medical Equipment Choice offered to:  Patient  DME Arranged:  Gilford Rile rolling DME Agency:  [place]:    [place] Agency:     Status of Service:  Completed, signed off  Medicare Important Message Given:    Date Medicare IM Given:    Medicare IM give by:    Date Additional Medicare IM Given:    Additional Medicare Important Message give by:     If discussed at [place] of Stay Meetings, dates discussed:    Additional Comments:  Adron Bene, RN 09/10/2015, 11:03 AM

## 2015-09-10 NOTE — ED Notes (Signed)
Pt reenters hospital with generalized weakness and hypotension. Report given to team nurse. On assessment of pt, pt verbalizes his legs started hurting after a fall Friday. Pt says pain started next the next morning.

## 2015-09-10 NOTE — Progress Notes (Signed)
CSW was consulted by Nurse CM to speak with patient regarding homelessness.   CSW met with patient at bedside. Patient confirms that he is homeless. Patient states that he has been homeless since last Thursday. Patient states that prior to Thursday he was living at the [facility] with his brother. Patient states that his brother is his primary support.  Patient states that he is unemployed and does not have any source of income. CSW provided the patient with community resources which included shelters, and food pantries. CSW offered to find patient a shelter at [facility]. Patient accepted. CSW reached out to [employer] who state patient is welcomed to come tonight.  CSW made patient aware. CSW provided patient with bus passes.  Willette Brace [phone] ED CSW 09/10/2015 10:37 PM

## 2015-09-10 NOTE — ED Provider Notes (Signed)
CSN: [id number]     Arrival date & time 09/10/15  1929 History   First MD Initiated Contact with Patient 09/10/15 2100     Chief Complaint  Patient presents with  . Weakness  . Hypotension     (Consider location/radiation/quality/duration/timing/severity/associated sxs/prior Treatment) Patient is a 55 y.o. male presenting with weakness. The history is provided by the patient.  Weakness This is a new problem. The current episode started 1 to 2 hours ago. The problem occurs constantly. The problem has been gradually improving. Pertinent negatives include no abdominal pain and no shortness of breath. Associated symptoms comments: Dizziness that felt better with sitting down. The symptoms are aggravated by standing. The symptoms are relieved by lying down. Treatments tried: ivf. The treatment provided moderate relief.    Past Medical History  Diagnosis Date  . ETOH abuse   . Cirrhosis of liver (HCC)   . COPD (chronic obstructive pulmonary disease) [facility])    Past Surgical History  Procedure Laterality Date  . No past surgeries      jaw surgery 51yrs ago   Family History  Problem Relation Age of Onset  . Diabetes Mother    Social History  Substance Use Topics  . Smoking status: Current Every Day Smoker -- 0.50 packs/day for 40 years    Types: Cigarettes  . Smokeless tobacco: Never Used  . Alcohol Use: No     Comment: 05/28/15-states still not drinking    Review of Systems  Respiratory: Negative for shortness of breath.   Gastrointestinal: Negative for abdominal pain.  Neurological: Positive for weakness.  All other systems reviewed and are negative.     Allergies  Review of patient's allergies indicates no known allergies.  Home Medications   Prior to Admission medications   Medication Sig Start Date End Date Taking? Authorizing Provider  albuterol (PROVENTIL HFA;VENTOLIN HFA) 108 (90 Base) MCG/ACT inhaler Inhale 2 puffs into the lungs every 6 (six) hours as needed  for wheezing or shortness of breath. 07/22/15  Yes [name], MD  furosemide (LASIX) 20 MG tablet Take 1 tablet (20 mg total) by mouth daily. 09/10/15  Yes [name], MD  lactulose (CHRONULAC) 10 GM/15ML solution Take 15 mLs (10 g total) by mouth 2 (two) times daily. 07/22/15  Yes [name], MD  Multiple Vitamin (MULTIVITAMIN WITH MINERALS) TABS tablet Take 1 tablet by mouth daily. 09/10/15  Yes [name], MD  pantoprazole (PROTONIX) 40 MG tablet Take 1 tablet (40 mg total) by mouth daily. 07/22/15  Yes [name], MD  potassium chloride SA (K-DUR,KLOR-CON) 20 MEQ tablet Take 1 tablet (20 mEq total) by mouth daily. 07/22/15  Yes [name], MD  spironolactone (ALDACTONE) 50 MG tablet Take 1 tablet (50 mg total) by mouth daily. 07/22/15  Yes [name], MD  thiamine 100 MG tablet Take 1 tablet (100 mg total) by mouth daily. 09/10/15  Yes [name], MD   BP 110/88 mmHg  Pulse 69  Temp(Src) 99.3 F (37.4 C) (Oral)  Resp 17  SpO2 95% Physical Exam  Constitutional: He is oriented to person, place, and time. He appears well-developed and well-nourished. No distress.  HENT:  Head: Normocephalic and atraumatic.  Mouth/Throat: Oropharynx is clear and moist.  Healing ecchymosis over the right eye  Eyes: Conjunctivae and EOM are normal. Pupils are equal, round, and reactive to light.  Neck: Normal range of motion. Neck supple.  Cardiovascular: Normal rate, regular rhythm and intact distal pulses.   No murmur heard. Pulmonary/Chest: Effort  normal and breath sounds normal. No respiratory distress. He has no wheezes. He has no rales.  Abdominal: Soft. He exhibits ascites. He exhibits no distension. There is no tenderness. There is no rebound and no guarding.  Non-tender  Musculoskeletal: Normal range of motion. He exhibits edema and tenderness.       Lumbar back: He exhibits tenderness.       Back:  2+ edema  Neurological: He is alert and oriented to person, place, and time.  Skin:  Skin is warm and dry. No rash noted. No erythema.  Psychiatric: He has a normal mood and affect. His behavior is normal.  Nursing note and vitals reviewed.   ED Course  Procedures (including critical care time) Labs Review Labs Reviewed  CBC WITH DIFFERENTIAL/PLATELET - Abnormal; Notable for the following:    WBC 13.8 (*)    MCH 34.5 (*)    MCHC 36.2 (*)    Platelets 47 (*)    Neutro Abs 10.3 (*)    Monocytes Absolute 1.8 (*)    All other components within normal limits  COMPREHENSIVE METABOLIC PANEL - Abnormal; Notable for the following:    Sodium 124 (*)    Potassium 3.4 (*)    Chloride 92 (*)    Glucose, Bld 123 (*)    Calcium 8.0 (*)    Total Protein 5.4 (*)    Albumin 2.2 (*)    AST 51 (*)    Total Bilirubin 2.4 (*)    All other components within normal limits  AMMONIA - Abnormal; Notable for the following:    Ammonia 37 (*)    All other components within normal limits  ETHANOL  I-STAT TROPOININ, ED  I-STAT CG4 LACTIC ACID, ED    Imaging Review Dg Lumbar Spine Complete  09/10/2015  CLINICAL DATA:  Lower back pain for 1 week. Nontraumatic. Lower extremity weakness for 2 or 3 days. EXAM: LUMBAR SPINE - COMPLETE 4+ VIEW COMPARISON:  None. FINDINGS: There is mild right convex curvature centered at L3. The lumbar vertebrae are normal in height. Good preservation of intervertebral disc spaces. No bone lesion or bony destruction. No fracture or other acute bony abnormality. IMPRESSION: Mild curvature.  No fracture or other acute bone abnormality Electronically Signed   By: Ellery Plunkaniel R [name] M.D.   On: 09/10/2015 22:09   [place] Paracentesis  09/09/2015  INDICATION: Abdominal distention secondary to recurrent ascites. Request therapeutic paracentesis. EXAM: ULTRASOUND GUIDED RIGHT LOWER QUADRANT PARACENTESIS MEDICATIONS: None. COMPLICATIONS: None immediate. PROCEDURE: Informed written consent was obtained from the patient after a discussion of the risks, benefits and alternatives to  treatment. A timeout was performed prior to the initiation of the procedure. Initial ultrasound scanning demonstrates a large amount of ascites within the right lower abdominal quadrant. The right lower abdomen was prepped and draped in the usual sterile fashion. 1% lidocaine with epinephrine was used for local anesthesia. Following this, a Safe-T-Centesis catheter was introduced. An ultrasound image was saved for documentation purposes. The paracentesis was performed. The catheter was removed and a dressing was applied. The patient tolerated the procedure well without immediate post procedural complication. FINDINGS: A total of approximately 7.2 L of clear yellow fluid was removed. IMPRESSION: Successful ultrasound-guided paracentesis yielding 7.2 liters of peritoneal fluid. Read by: Brayton ElKevin Bruning PA-C Electronically Signed   By: Irish LackGlenn  Yamagata M.D.   On: 09/09/2015 16:12   I have personally reviewed and evaluated these images and lab results as part of my medical decision-making.   EKG Interpretation  Date/Time:  Friday September 10 2015 21:12:15 EDT Ventricular Rate:  89 PR Interval:  148 QRS Duration: 102 QT Interval:  360 QTC Calculation: 438 R Axis:   89 Text Interpretation:  Sinus rhythm Consider right atrial enlargement No  significant change since last tracing Confirmed by Anitra Lauth  MD, Alphonzo Lemmings  [phone]) on 09/10/2015 9:32:41 PM      MDM   Final diagnoses:  Postprocedural hypotension  Dehydration    Patient is a 55 year old male with significant medical problems including alcoholic cirrhosis and the complications of that. Patient was discharged from the hospital today however she has no place to live so was [place] to the park. Patient had not noted any food all day since leaving the hospital and was sitting outside on a park bench. He attempted to stand and felt very weak. He states he had pain in his back and his legs and was unable to stand. He was also complaining of feeling  dizzy. When EMS arrived patient had a heart rate of 120 and a blood pressure of 78/50. Because of patient's advanced cirrhosis he typically has a blood pressure in the 90s and he recently had 12 L of fluid removed from his belly into different paracenteses.   Upon arrival here patient's blood pressure was in the 80s systolic. He was given 500 mL of fluid with improvement of blood pressure and symptoms. He ate and drink here without difficulty. Labs are unchanged and low suspicion for SBP. Patient has no abdominal pain at this time. Patient was complaining of pain in the back and in the legs but is neurovascularly intact. He can move his legs without difficulty. Some lumbar tenderness which he states has been there since the fall. Lumbar imaging negative today. Patient was able to get out of bed and ambulate with a walker. Case management and social work saw the patient and [facility]. He was given bus passes. He was also given follow-up with health and wellness.    Gwyneth Sprout, MD 09/10/15 [phone]

## 2015-09-10 NOTE — Progress Notes (Signed)
Discharge instructions reviewed with patient, he requested to go home with a cup of applesauce, applesauce and spoon given to patient. He is waiting on a bus pass to go home.

## 2015-09-11 LAB — CULTURE, BODY FLUID W GRAM STAIN -BOTTLE: Culture: NO GROWTH

## 2015-09-13 ENCOUNTER — Telehealth: Payer: Self-pay

## 2015-09-13 MED FILL — FUROSEMIDE 20 MG TABLET: 20 | 30 days supply | Qty: 30 | Fill #0

## 2015-09-13 NOTE — Telephone Encounter (Signed)
Request received from Radford PaxAmy Ferrero, RN CM requesting a hospital follow up appointment for the patient. As noted in the request, the patient has no phone and was [facility].   [phone] and [facility] that they do not have a Libby Mawobert Das staying there.   Update provided to A. Bennie DallasFerrero, RN CM

## 2015-09-16 ENCOUNTER — Inpatient Hospital Stay (HOSPITAL_COMMUNITY)
Admission: EM | Admit: 2015-09-16 | Discharge: 2015-09-22 | DRG: 433 | Disposition: A | Discharge status: Home / Self Care | Payer: Medicaid Other | Attending: Family Medicine | Admitting: Family Medicine

## 2015-09-16 ENCOUNTER — Encounter (HOSPITAL_COMMUNITY): Payer: Self-pay | Admitting: Emergency Medicine

## 2015-09-16 DIAGNOSIS — E876 Hypokalemia: Secondary | ICD-10-CM | POA: Diagnosis present

## 2015-09-16 DIAGNOSIS — R188 Other ascites: Secondary | ICD-10-CM

## 2015-09-16 DIAGNOSIS — E860 Dehydration: Secondary | ICD-10-CM | POA: Diagnosis present

## 2015-09-16 DIAGNOSIS — K769 Liver disease, unspecified: Secondary | ICD-10-CM

## 2015-09-16 DIAGNOSIS — R600 Localized edema: Secondary | ICD-10-CM | POA: Diagnosis present

## 2015-09-16 DIAGNOSIS — K7031 Alcoholic cirrhosis of liver with ascites: Principal | ICD-10-CM | POA: Diagnosis present

## 2015-09-16 DIAGNOSIS — E871 Hypo-osmolality and hyponatremia: Secondary | ICD-10-CM | POA: Diagnosis present

## 2015-09-16 DIAGNOSIS — D696 Thrombocytopenia, unspecified: Secondary | ICD-10-CM | POA: Diagnosis present

## 2015-09-16 DIAGNOSIS — E43 Unspecified severe protein-calorie malnutrition: Secondary | ICD-10-CM | POA: Insufficient documentation

## 2015-09-16 DIAGNOSIS — R7989 Other specified abnormal findings of blood chemistry: Secondary | ICD-10-CM | POA: Diagnosis present

## 2015-09-16 DIAGNOSIS — Z79899 Other long term (current) drug therapy: Secondary | ICD-10-CM

## 2015-09-16 DIAGNOSIS — D689 Coagulation defect, unspecified: Secondary | ICD-10-CM | POA: Diagnosis present

## 2015-09-16 DIAGNOSIS — Z833 Family history of diabetes mellitus: Secondary | ICD-10-CM

## 2015-09-16 DIAGNOSIS — K219 Gastro-esophageal reflux disease without esophagitis: Secondary | ICD-10-CM | POA: Diagnosis present

## 2015-09-16 DIAGNOSIS — F1010 Alcohol abuse, uncomplicated: Secondary | ICD-10-CM | POA: Diagnosis present

## 2015-09-16 DIAGNOSIS — R197 Diarrhea, unspecified: Secondary | ICD-10-CM | POA: Diagnosis present

## 2015-09-16 DIAGNOSIS — K7460 Unspecified cirrhosis of liver: Secondary | ICD-10-CM | POA: Insufficient documentation

## 2015-09-16 DIAGNOSIS — F17200 Nicotine dependence, unspecified, uncomplicated: Secondary | ICD-10-CM | POA: Diagnosis present

## 2015-09-16 DIAGNOSIS — I959 Hypotension, unspecified: Secondary | ICD-10-CM | POA: Diagnosis present

## 2015-09-16 DIAGNOSIS — Z590 Homelessness: Secondary | ICD-10-CM

## 2015-09-16 DIAGNOSIS — J449 Chronic obstructive pulmonary disease, unspecified: Secondary | ICD-10-CM | POA: Diagnosis present

## 2015-09-16 DIAGNOSIS — N179 Acute kidney failure, unspecified: Secondary | ICD-10-CM | POA: Diagnosis present

## 2015-09-16 DIAGNOSIS — E875 Hyperkalemia: Secondary | ICD-10-CM | POA: Diagnosis present

## 2015-09-16 DIAGNOSIS — Z9119 Patient's noncompliance with other medical treatment and regimen: Secondary | ICD-10-CM

## 2015-09-16 DIAGNOSIS — J209 Acute bronchitis, unspecified: Secondary | ICD-10-CM | POA: Diagnosis present

## 2015-09-16 LAB — COMPREHENSIVE METABOLIC PANEL
ALK PHOS: 79 U/L (ref 38–126)
ALT: 40 U/L (ref 17–63)
AST: 70 U/L — ABNORMAL HIGH (ref 15–41)
Albumin: 2.1 g/dL — ABNORMAL LOW (ref 3.5–5.0)
Anion gap: 11 (ref 5–15)
BILIRUBIN TOTAL: 1.5 mg/dL — AB (ref 0.3–1.2)
BUN: 50 mg/dL — ABNORMAL HIGH (ref 6–20)
CALCIUM: 7.8 mg/dL — AB (ref 8.9–10.3)
CO2: 21 mmol/L — AB (ref 22–32)
CREATININE: 2.24 mg/dL — AB (ref 0.61–1.24)
Chloride: 89 mmol/L — ABNORMAL LOW (ref 101–111)
GFR, EST AFRICAN AMERICAN: 36 mL/min — AB (ref >60)
GFR, EST NON AFRICAN AMERICAN: 31 mL/min — AB (ref >60)
Glucose, Bld: 108 mg/dL — ABNORMAL HIGH (ref 65–99)
Potassium: 3 mmol/L — ABNORMAL LOW (ref 3.5–5.1)
SODIUM: 121 mmol/L — AB (ref 135–145)
TOTAL PROTEIN: 5.2 g/dL — AB (ref 6.5–8.1)

## 2015-09-16 LAB — CBC WITH DIFFERENTIAL/PLATELET
Basophils Absolute: 0 10*3/uL (ref 0.0–0.1)
Basophils Relative: 0 %
EOS PCT: 0 %
Eosinophils Absolute: 0 10*3/uL (ref 0.0–0.7)
HEMATOCRIT: 33.7 % — AB (ref 39.0–52.0)
HEMOGLOBIN: 12.6 g/dL — AB (ref 13.0–17.0)
LYMPHS PCT: 15 %
Lymphs Abs: 2 10*3/uL (ref 0.7–4.0)
MCH: 34.5 pg — AB (ref 26.0–34.0)
MCHC: 37.4 g/dL — AB (ref 30.0–36.0)
MCV: 92.3 fL (ref 78.0–100.0)
MONOS PCT: 9 %
Monocytes Absolute: 1.2 10*3/uL — ABNORMAL HIGH (ref 0.1–1.0)
NEUTROS ABS: 10.2 10*3/uL — AB (ref 1.7–7.7)
NEUTROS PCT: 76 %
Platelets: 104 10*3/uL — ABNORMAL LOW (ref 150–400)
RBC: 3.65 MIL/uL — ABNORMAL LOW (ref 4.22–5.81)
RDW: 14.1 % (ref 11.5–15.5)
WBC: 13.4 10*3/uL — ABNORMAL HIGH (ref 4.0–10.5)

## 2015-09-16 LAB — ETHANOL: Alcohol, Ethyl (B): <5 mg/dL (ref <5)

## 2015-09-16 LAB — PROTIME-INR
INR: 1.36 (ref 0.00–1.49)
Prothrombin Time: 16.3 seconds — ABNORMAL HIGH (ref 11.6–15.2)

## 2015-09-16 LAB — APTT: aPTT: 37 seconds (ref 24–37)

## 2015-09-16 MED ORDER — VITAMIN B-1 100 MG PO TABS
100.0000 mg | ORAL_TABLET | Freq: Every day | ORAL | Status: DC
  Administered 2015-09-16 – 2015-09-22 (×7): 100 mg via ORAL
  Filled 2015-09-16 (×7): qty 1

## 2015-09-16 MED ORDER — LACTULOSE 10 GM/15ML PO SOLN
10.0000 g | Freq: Two times a day (BID) | ORAL | Status: DC
  Administered 2015-09-16: 10 g via ORAL
  Filled 2015-09-16 (×4): qty 15

## 2015-09-16 MED ORDER — SODIUM CHLORIDE 0.9 % IV SOLN
INTRAVENOUS | Status: DC
  Administered 2015-09-16: 21:00:00 via INTRAVENOUS
  Administered 2015-09-17: 1000 mL via INTRAVENOUS

## 2015-09-16 MED ORDER — PANTOPRAZOLE SODIUM 40 MG PO TBEC
40.0000 mg | DELAYED_RELEASE_TABLET | Freq: Every day | ORAL | Status: DC
  Administered 2015-09-16 – 2015-09-18 (×3): 40 mg via ORAL
  Filled 2015-09-16 (×3): qty 1

## 2015-09-16 MED ORDER — ALBUTEROL SULFATE HFA 108 (90 BASE) MCG/ACT IN AERS: 2.0000 | INHALATION_SPRAY | Freq: Four times a day (QID) | RESPIRATORY_TRACT | Status: DC | PRN

## 2015-09-16 MED ORDER — DEXTROSE 5 % IV SOLN
2.0000 g | INTRAVENOUS | Status: DC
  Administered 2015-09-16 – 2015-09-17 (×2): 2 g via INTRAVENOUS
  Filled 2015-09-16 (×2): qty 2

## 2015-09-16 MED ORDER — ALBUTEROL SULFATE (2.5 MG/3ML) 0.083% IN NEBU: 2.5000 mg | INHALATION_SOLUTION | Freq: Four times a day (QID) | RESPIRATORY_TRACT | Status: DC | PRN

## 2015-09-16 MED ORDER — POTASSIUM CHLORIDE CRYS ER 20 MEQ PO TBCR
40.0000 meq | EXTENDED_RELEASE_TABLET | Freq: Once | ORAL | Status: DC
  Filled 2015-09-16: qty 2

## 2015-09-16 NOTE — H&P (Signed)
History and Physical    [name] ONG:[id number] DOB: 11-17-1960 DOA: 09/16/2015  Referring MD/NP/PA: Dr. Clydene Pugh PCP: [name], MD Outpatient Specialists: None Patient coming from: Homeless  Chief Complaint: Homeless  HPI: [name] is a 55 y.o. male with medical history significant of EtOH cirrhosis, COPD, homeless.  Patient presents to the ED with chief complaint of homelessness.  He [place] the park, has no where to go, he was noted to be hypotensive by EMS and on arrival to the ED.  Though apparently asymptomatic from this other than his chronic abdominal and back pain.  ED Course: patient was found to have new onset AKI on work up today in the ED.  Other labs, while very abnormal, appear to be baseline for the patient.  Due to hypotension in the ED, he was given IVF, BP has improved.  Review of Systems: As per HPI otherwise 10 point review of systems negative.    Past Medical History  Diagnosis Date  . ETOH abuse   . Cirrhosis of liver (HCC)   . COPD (chronic obstructive pulmonary disease) [facility])     Past Surgical History  Procedure Laterality Date  . No past surgeries      jaw surgery 30yrs ago     reports that he has been smoking Cigarettes.  He has a 20 pack-year smoking history. He has never used smokeless tobacco. He reports that he does not drink alcohol or use illicit drugs.  No Known Allergies  Family History  Problem Relation Age of Onset  . Diabetes Mother      Prior to Admission medications   Medication Sig Start Date End Date Taking? Authorizing Provider  albuterol (PROVENTIL HFA;VENTOLIN HFA) 108 (90 Base) MCG/ACT inhaler Inhale 2 puffs into the lungs every 6 (six) hours as needed for wheezing or shortness of breath. 07/22/15  Yes [name], MD  furosemide (LASIX) 20 MG tablet Take 1 tablet (20 mg total) by mouth daily. 09/10/15  Yes [name], MD  lactulose (CHRONULAC) 10 GM/15ML solution Take 15 mLs (10 g total) by  mouth 2 (two) times daily. 07/22/15  Yes [name], MD  pantoprazole (PROTONIX) 40 MG tablet Take 1 tablet (40 mg total) by mouth daily. 07/22/15  Yes [name], MD  potassium chloride SA (K-DUR,KLOR-CON) 20 MEQ tablet Take 1 tablet (20 mEq total) by mouth daily. 07/22/15  Yes [name], MD  spironolactone (ALDACTONE) 50 MG tablet Take 1 tablet (50 mg total) by mouth daily. 07/22/15  Yes [name], MD  thiamine 100 MG tablet Take 1 tablet (100 mg total) by mouth daily. 09/10/15  Yes [name], MD    Physical Exam: Filed Vitals:   09/16/15 1843 09/16/15 1900 09/16/15 1930 09/16/15 2030  BP: 112/81 88/65 86/68  97/81  Pulse: 88 89 90 86  Temp:      TempSrc:      Resp: SpO2: 97% 97% 96% 98%      Constitutional: NAD, calm, comfortable Filed Vitals:   09/16/15 1843 09/16/15 1900 09/16/15 1930 09/16/15 2030  BP: 112/81 88/65 86/68  97/81  Pulse: 88 89 90 86  Temp:      TempSrc:      Resp: SpO2: 97% 97% 96% 98%   Eyes: PERRL, lids and conjunctivae normal ENMT: Mucous membranes are moist. Posterior pharynx clear of any exudate or lesions.Normal dentition.  Neck: normal, supple, no masses, no thyromegaly Respiratory: clear to  auscultation bilaterally, no wheezing, no crackles. Normal respiratory effort. No accessory muscle use.  Cardiovascular: Regular rate and rhythm, no murmurs / rubs / gallops. No extremity edema. 2+ pedal pulses. No carotid bruits.  Abdomen: Diffusely tender, no rebound, is distended with ascites, no masses palpated. No hepatosplenomegaly. Bowel sounds positive.  Musculoskeletal: no clubbing / cyanosis. No joint deformity upper and lower extremities. Good ROM, no contractures. Normal muscle tone.  Skin: no rashes, lesions, ulcers. No induration Neurologic: CN 2-12 grossly intact. Sensation intact, DTR normal. Strength 5/5 in all 4.  Psychiatric: Normal judgment and insight. Alert and oriented x 3. Normal mood.    Labs on  Admission: I have personally reviewed following labs and imaging studies  CBC:  Recent Labs Lab 09/10/15 2128 09/16/15 1719  WBC 13.8* 13.4*  NEUTROABS 10.3* 10.2*  HGB 14.6 12.6*  HCT 40.3 33.7*  MCV 95.3 92.3  PLT 47* 104*   Basic Metabolic Panel:  Recent Labs Lab 09/10/15 2128 09/16/15 1719  NA 124* 121*  K 3.4* 3.0*  CL 92* 89*  CO2 22 21*  GLUCOSE 123* 108*  BUN 18 50*  CREATININE 0.95 2.24*  CALCIUM 8.0* 7.8*   GFR: Estimated Creatinine Clearance: 37.3 mL/min (by C-G formula based on Cr of 2.24). Liver Function Tests:  Recent Labs Lab 09/10/15 2128 09/16/15 1719  AST 51* 70*  ALT 32 40  ALKPHOS 89 79  BILITOT 2.4* 1.5*  PROT 5.4* 5.2*  ALBUMIN 2.2* 2.1*   No results for input(s): LIPASE, AMYLASE in the last 168 hours.  Recent Labs Lab 09/10/15 2128  AMMONIA 37*   Coagulation Profile:  Recent Labs Lab 09/16/15 1719  INR 1.36   Cardiac Enzymes: No results for input(s): CKTOTAL, CKMB, CKMBINDEX, TROPONINI in the last 168 hours. BNP (last 3 results) No results for input(s): PROBNP in the last 8760 hours. HbA1C: No results for input(s): HGBA1C in the last 72 hours. CBG: No results for input(s): GLUCAP in the last 168 hours. Lipid Profile: No results for input(s): CHOL, HDL, LDLCALC, TRIG, CHOLHDL, LDLDIRECT in the last 72 hours. Thyroid Function Tests: No results for input(s): TSH, T4TOTAL, FREET4, T3FREE, THYROIDAB in the last 72 hours. Anemia Panel: No results for input(s): VITAMINB12, FOLATE, FERRITIN, TIBC, IRON, RETICCTPCT in the last 72 hours. Urine analysis:    Component Value Date/Time   COLORURINE AMBER* 09/06/2015 0714   APPEARANCEUR CLEAR 09/06/2015 0714   LABSPEC 1.017 09/06/2015 0714   PHURINE 6.0 09/06/2015 0714   GLUCOSEU NEGATIVE 09/06/2015 0714   HGBUR TRACE* 09/06/2015 0714   BILIRUBINUR SMALL* 09/06/2015 0714   KETONESUR 15* 09/06/2015 0714   PROTEINUR NEGATIVE 09/06/2015 0714   UROBILINOGEN 0.2 04/11/2015 0800     NITRITE POSITIVE* 09/06/2015 0714   LEUKOCYTESUR SMALL* 09/06/2015 0714   Sepsis Labs: (procalcitonin:4,lacticidven:4) )No results found for this or any previous visit (from the past 240 hour(s)).   Radiological Exams on Admission: No results found.  EKG: Independently reviewed.  Assessment/Plan Principal Problem:   AKI (acute kidney injury) (HCC) Active Problems:   Alcoholic cirrhosis of liver with ascites (HCC)   Hyponatremia with excess extracellular fluid volume   Hypokalemia    AKI - Suspect pre-renal etiology associated with his hypotension today.  IVF - bolus in ED followed by continuous at 125 cc/hr  EDP does not suspect spontaneous bacterial peritonitis at this point.  He does have chronic abdominal pain but says that this is worse over the past 4 days.  Will go ahead and empirically treat  for SBP with rocephin.  US paracentesis ordered with labs on the fluid  Repeat CBC / BMP in AM  Holding diuretics and BP meds due to hypotension Hypokalemia - replacing potassium PO and rechecking BMP in AM Hyponatremia - giving IVF and holding diuretics as above, will trend by checking repeat BMP in AM.  Mostly appears to be chronic though with very poor baseline.   DVT prophylaxis: SCDs Code Status: Full Family Communication: No family in room Consults called: None Admission status: Admit to obs   GARDNER, Heywood IlesJARED M. DO [facility] Pager [phone] from 7PM-7AM  If 7AM-7PM, please contact the day physician for the patient [email or web address] Password [email or web address]  09/16/2015, 9:23 PM

## 2015-09-16 NOTE — ED Notes (Signed)
Patient presents via EMS for ascites, hypotension, cirrhosis of liver. Patient recently paracentesis.   Last VS: 85/56, 91hr, 20resp, 100%ra.

## 2015-09-16 NOTE — ED Notes (Signed)
Bed: [id number] Expected date:  Expected time:  Means of arrival:  Comments: 7164 - Ascites

## 2015-09-16 NOTE — ED Provider Notes (Signed)
CSN: [id number]     Arrival date & time 09/16/15  1636 History   First MD Initiated Contact with Patient 09/16/15 1646     No chief complaint on file.    (Consider location/radiation/quality/duration/timing/severity/associated sxs/prior Treatment) Patient is a 55 y.o. male presenting with general illness. The history is provided by the patient.  Illness Location:  Abdomen and back Quality:  Pain Severity:  Moderate Onset quality:  Gradual Duration:  1 week Timing:  Constant Progression:  Unchanged Chronicity:  Chronic Context:  Chronic hypotension, chronic liver disease Relieved by:  Nothing Ineffective treatments:  Fluids previously Associated symptoms: no loss of consciousness   Associated symptoms comment:  Generalized weakness, loss of energy Risk factors:  Homelessness, chronic liver disease, noncompliance and loss to follow up   Past Medical History  Diagnosis Date  . ETOH abuse   . Cirrhosis of liver (HCC)   . COPD (chronic obstructive pulmonary disease) [facility](HCC)    Past Surgical History  Procedure Laterality Date  . No past surgeries      jaw surgery 2740yrs ago   Family History  Problem Relation Age of Onset  . Diabetes Mother    Social History  Substance Use Topics  . Smoking status: Current Every Day Smoker -- 0.50 packs/day for 40 years    Types: Cigarettes  . Smokeless tobacco: Never Used  . Alcohol Use: No     Comment: 05/28/15-states still not drinking    Review of Systems  Neurological: Negative for loss of consciousness.  All other systems reviewed and are negative.     Allergies  Review of patient's allergies indicates no known allergies.  Home Medications   Prior to Admission medications   Medication Sig Start Date End Date Taking? Authorizing Provider  albuterol (PROVENTIL HFA;VENTOLIN HFA) 108 (90 Base) MCG/ACT inhaler Inhale 2 puffs into the lungs every 6 (six) hours as needed for wheezing or shortness of breath. 07/22/15  Yes Jaclyn ShaggyEnobong  [name], MD  furosemide (LASIX) 20 MG tablet Take 1 tablet (20 mg total) by mouth daily. 09/10/15  Yes Penny Piarlando Vega, MD  lactulose (CHRONULAC) 10 GM/15ML solution Take 15 mLs (10 g total) by mouth 2 (two) times daily. 07/22/15  Yes [name], MD  pantoprazole (PROTONIX) 40 MG tablet Take 1 tablet (40 mg total) by mouth daily. 07/22/15  Yes [name], MD  potassium chloride SA (K-DUR,KLOR-CON) 20 MEQ tablet Take 1 tablet (20 mEq total) by mouth daily. 07/22/15  Yes [name], MD  spironolactone (ALDACTONE) 50 MG tablet Take 1 tablet (50 mg total) by mouth daily. 07/22/15  Yes [name], MD  thiamine 100 MG tablet Take 1 tablet (100 mg total) by mouth daily. 09/10/15  Yes Penny Piarlando Vega, MD  Multiple Vitamin (MULTIVITAMIN WITH MINERALS) TABS tablet Take 1 tablet by mouth daily. Patient not taking: Reported on 09/16/2015 09/10/15   Penny Piarlando Vega, MD   BP 112/81 mmHg  Pulse 88  Temp(Src) 97.5 F (36.4 C) (Oral)  Resp 14  SpO2 97% Physical Exam  Constitutional: He is oriented to person, place, and time. He appears well-developed and well-nourished. No distress.  HENT:  Head: Normocephalic and atraumatic.  Eyes: Conjunctivae are normal.  Neck: Neck supple. No tracheal deviation present.  Cardiovascular: Normal rate, regular rhythm and normal heart sounds.   Pulmonary/Chest: Effort normal and breath sounds normal. No respiratory distress.  Abdominal: Soft. He exhibits ascites. He exhibits no distension. There is no tenderness. There is no rebound and no guarding.  Neurological: He is  alert and oriented to person, place, and time.  Skin: Skin is warm and dry.  Psychiatric: He has a normal mood and affect.    ED Course  Procedures (including critical care time) Labs Review Labs Reviewed  CBC WITH DIFFERENTIAL/PLATELET - Abnormal; Notable for the following:    WBC 13.4 (*)    RBC 3.65 (*)    Hemoglobin 12.6 (*)    HCT 33.7 (*)    MCH 34.5 (*)    MCHC 37.4 (*)    Platelets 104 (*)     Neutro Abs 10.2 (*)    Monocytes Absolute 1.2 (*)    All other components within normal limits  COMPREHENSIVE METABOLIC PANEL - Abnormal; Notable for the following:    Sodium 121 (*)    Potassium 3.0 (*)    Chloride 89 (*)    CO2 21 (*)    Glucose, Bld 108 (*)    BUN 50 (*)    Creatinine, Ser 2.24 (*)    Calcium 7.8 (*)    Total Protein 5.2 (*)    Albumin 2.1 (*)    AST 70 (*)    Total Bilirubin 1.5 (*)    GFR calc non Af Amer 31 (*)    GFR calc Af Amer 36 (*)    All other components within normal limits  PROTIME-INR - Abnormal; Notable for the following:    Prothrombin Time 16.3 (*)    All other components within normal limits  APTT  ETHANOL  CORTISOL  CBC  BASIC METABOLIC PANEL    Imaging Review No results found. I have personally reviewed and evaluated these images and lab results as part of my medical decision-making.   EKG Interpretation None      MDM   Final diagnoses:  AKI (acute kidney injury) (HCC)  Chronic liver disease and cirrhosis (HCC)    55 y.o. male presents with ongoing back pain, difficult social situation. Lost to follow up from previous visit. Last had paracentesis for chronic ascites 2 weeks ago and had 7 L removed. [place], called EMS today for ongoing pain difficulty. Found to have AKI, unclear if this is 2/2 chronic hypotension with liver disease, hepatorenal, dehydration, or another phenomenon. Ongoing belly pain without fever, not tense or significantly tender, low suspicion for SBP. Hospitalist was consulted for admission and will see the patient in the emergency department.     Lyndal Pulley, MD 09/17/15 [phone]

## 2015-09-16 NOTE — Progress Notes (Signed)
CSW was notified by Nurse CM that patient is having issues with shelter. CSW reached out to [facility] and spoke with Tammy. She states that the patient is welcome to come to the shelter tonight. However, she states that there are no available beds and that patient would be in lobby.  Trish MageBrittney Zakara Parkey, LCSWA [phone] ED CSW 09/16/2015 5:48 PM

## 2015-09-16 NOTE — Progress Notes (Signed)
[facility].  Patient is homeless.  Patient is without a phone.  [place] living [place] a park with his brother.  [facility].  [facility]  Per EDSW, [facility]  Discussed patient with EDP.

## 2015-09-17 ENCOUNTER — Observation Stay (HOSPITAL_COMMUNITY): Payer: Medicaid Other

## 2015-09-17 DIAGNOSIS — Z9119 Patient's noncompliance with other medical treatment and regimen: Secondary | ICD-10-CM | POA: Diagnosis not present

## 2015-09-17 DIAGNOSIS — J449 Chronic obstructive pulmonary disease, unspecified: Secondary | ICD-10-CM | POA: Diagnosis present

## 2015-09-17 DIAGNOSIS — F17200 Nicotine dependence, unspecified, uncomplicated: Secondary | ICD-10-CM | POA: Diagnosis present

## 2015-09-17 DIAGNOSIS — Z833 Family history of diabetes mellitus: Secondary | ICD-10-CM | POA: Diagnosis not present

## 2015-09-17 DIAGNOSIS — Z515 Encounter for palliative care: Secondary | ICD-10-CM | POA: Diagnosis not present

## 2015-09-17 DIAGNOSIS — F1010 Alcohol abuse, uncomplicated: Secondary | ICD-10-CM | POA: Diagnosis present

## 2015-09-17 DIAGNOSIS — Z590 Homelessness: Secondary | ICD-10-CM | POA: Diagnosis not present

## 2015-09-17 DIAGNOSIS — R197 Diarrhea, unspecified: Secondary | ICD-10-CM | POA: Diagnosis present

## 2015-09-17 DIAGNOSIS — Z6822 Body mass index (BMI) 22.0-22.9, adult: Secondary | ICD-10-CM | POA: Diagnosis not present

## 2015-09-17 DIAGNOSIS — R600 Localized edema: Secondary | ICD-10-CM | POA: Diagnosis present

## 2015-09-17 DIAGNOSIS — J209 Acute bronchitis, unspecified: Secondary | ICD-10-CM | POA: Diagnosis present

## 2015-09-17 DIAGNOSIS — D696 Thrombocytopenia, unspecified: Secondary | ICD-10-CM | POA: Diagnosis present

## 2015-09-17 DIAGNOSIS — E876 Hypokalemia: Secondary | ICD-10-CM | POA: Diagnosis present

## 2015-09-17 DIAGNOSIS — E46 Unspecified protein-calorie malnutrition: Secondary | ICD-10-CM | POA: Diagnosis not present

## 2015-09-17 DIAGNOSIS — E860 Dehydration: Secondary | ICD-10-CM | POA: Diagnosis present

## 2015-09-17 DIAGNOSIS — K219 Gastro-esophageal reflux disease without esophagitis: Secondary | ICD-10-CM | POA: Diagnosis present

## 2015-09-17 DIAGNOSIS — I959 Hypotension, unspecified: Secondary | ICD-10-CM | POA: Diagnosis present

## 2015-09-17 DIAGNOSIS — N179 Acute kidney failure, unspecified: Secondary | ICD-10-CM | POA: Diagnosis present

## 2015-09-17 DIAGNOSIS — E875 Hyperkalemia: Secondary | ICD-10-CM | POA: Diagnosis present

## 2015-09-17 DIAGNOSIS — E871 Hypo-osmolality and hyponatremia: Secondary | ICD-10-CM | POA: Diagnosis present

## 2015-09-17 DIAGNOSIS — R7989 Other specified abnormal findings of blood chemistry: Secondary | ICD-10-CM | POA: Diagnosis present

## 2015-09-17 DIAGNOSIS — Z79899 Other long term (current) drug therapy: Secondary | ICD-10-CM | POA: Diagnosis not present

## 2015-09-17 DIAGNOSIS — K7031 Alcoholic cirrhosis of liver with ascites: Secondary | ICD-10-CM | POA: Diagnosis present

## 2015-09-17 DIAGNOSIS — D689 Coagulation defect, unspecified: Secondary | ICD-10-CM | POA: Diagnosis present

## 2015-09-17 DIAGNOSIS — M549 Dorsalgia, unspecified: Secondary | ICD-10-CM | POA: Diagnosis present

## 2015-09-17 LAB — COMPREHENSIVE METABOLIC PANEL
ALBUMIN: 2 g/dL — AB (ref 3.5–5.0)
ALK PHOS: 79 U/L (ref 38–126)
ALT: 36 U/L (ref 17–63)
ANION GAP: 10 (ref 5–15)
AST: 56 U/L — ABNORMAL HIGH (ref 15–41)
BILIRUBIN TOTAL: 1 mg/dL (ref 0.3–1.2)
BUN: 47 mg/dL — ABNORMAL HIGH (ref 6–20)
CALCIUM: 7.8 mg/dL — AB (ref 8.9–10.3)
CO2: 18 mmol/L — AB (ref 22–32)
CREATININE: 1.52 mg/dL — AB (ref 0.61–1.24)
Chloride: 96 mmol/L — ABNORMAL LOW (ref 101–111)
GFR calc Af Amer: 58 mL/min — ABNORMAL LOW (ref >60)
GFR calc non Af Amer: 50 mL/min — ABNORMAL LOW (ref >60)
GLUCOSE: 130 mg/dL — AB (ref 65–99)
Potassium: 3.3 mmol/L — ABNORMAL LOW (ref 3.5–5.1)
SODIUM: 124 mmol/L — AB (ref 135–145)
TOTAL PROTEIN: 5.2 g/dL — AB (ref 6.5–8.1)

## 2015-09-17 LAB — BODY FLUID CELL COUNT WITH DIFFERENTIAL
LYMPHS FL: 27 %
MONOCYTE-MACROPHAGE-SEROUS FLUID: 71 % (ref 50–90)
Neutrophil Count, Fluid: 2 % (ref 0–25)
Total Nucleated Cell Count, Fluid: 32 cu mm (ref 0–1000)

## 2015-09-17 LAB — CBC
HEMATOCRIT: 32.6 % — AB (ref 39.0–52.0)
Hemoglobin: 12 g/dL — ABNORMAL LOW (ref 13.0–17.0)
MCH: 34.4 pg — AB (ref 26.0–34.0)
MCHC: 36.8 g/dL — ABNORMAL HIGH (ref 30.0–36.0)
MCV: 93.4 fL (ref 78.0–100.0)
PLATELETS: 55 10*3/uL — AB (ref 150–400)
RBC: 3.49 MIL/uL — ABNORMAL LOW (ref 4.22–5.81)
RDW: 14.3 % (ref 11.5–15.5)
WBC: 10.9 10*3/uL — ABNORMAL HIGH (ref 4.0–10.5)

## 2015-09-17 LAB — CORTISOL: Cortisol, Plasma: 19.8 ug/dL

## 2015-09-17 LAB — CREATININE, URINE, RANDOM: Creatinine, Urine: 126.26 mg/dL

## 2015-09-17 LAB — BASIC METABOLIC PANEL
ANION GAP: 7 (ref 5–15)
BUN: 52 mg/dL — AB (ref 6–20)
CALCIUM: 7.6 mg/dL — AB (ref 8.9–10.3)
CO2: 20 mmol/L — AB (ref 22–32)
Chloride: 96 mmol/L — ABNORMAL LOW (ref 101–111)
Creatinine, Ser: 1.85 mg/dL — ABNORMAL HIGH (ref 0.61–1.24)
GFR calc Af Amer: 46 mL/min — ABNORMAL LOW (ref >60)
GFR, EST NON AFRICAN AMERICAN: 40 mL/min — AB (ref >60)
GLUCOSE: 94 mg/dL (ref 65–99)
Potassium: 2.7 mmol/L — CL (ref 3.5–5.1)
Sodium: 123 mmol/L — ABNORMAL LOW (ref 135–145)

## 2015-09-17 LAB — SODIUM, URINE, RANDOM: Sodium, Ur: <10 mmol/L

## 2015-09-17 MED ORDER — OCTREOTIDE ACETATE 50 MCG/ML IJ SOLN
50.0000 ug | Freq: Three times a day (TID) | INTRAMUSCULAR | Status: DC
  Administered 2015-09-17 (×2): 50 ug via SUBCUTANEOUS
  Filled 2015-09-17 (×2): qty 1

## 2015-09-17 MED ORDER — MIDODRINE HCL 5 MG PO TABS
10.0000 mg | ORAL_TABLET | Freq: Three times a day (TID) | ORAL | Status: DC
  Administered 2015-09-17: 10 mg via ORAL
  Filled 2015-09-17 (×2): qty 2

## 2015-09-17 MED ORDER — PRO-STAT SUGAR FREE PO LIQD
30.0000 mL | Freq: Two times a day (BID) | ORAL | Status: DC
  Administered 2015-09-17 – 2015-09-20 (×6): 30 mL via ORAL
  Filled 2015-09-17 (×8): qty 30

## 2015-09-17 MED ORDER — DOXYCYCLINE HYCLATE 100 MG PO TABS
100.0000 mg | ORAL_TABLET | Freq: Two times a day (BID) | ORAL | Status: DC
  Administered 2015-09-17 – 2015-09-20 (×7): 100 mg via ORAL
  Filled 2015-09-17 (×8): qty 1

## 2015-09-17 MED ORDER — ALBUMIN HUMAN 25 % IV SOLN
25.0000 g | Freq: Once | INTRAVENOUS | Status: AC
  Administered 2015-09-17: 25 g via INTRAVENOUS
  Filled 2015-09-17: qty 100

## 2015-09-17 MED ORDER — ALBUMIN HUMAN 25 % IV SOLN: 12.5000 g | Freq: Two times a day (BID) | INTRAVENOUS | Status: DC

## 2015-09-17 MED ORDER — SODIUM CHLORIDE 0.9 % IV SOLN
INTRAVENOUS | Status: DC
  Administered 2015-09-17: 23:00:00 via INTRAVENOUS

## 2015-09-17 MED ORDER — CALCIUM CARBONATE ANTACID 500 MG PO CHEW
1.0000 | CHEWABLE_TABLET | Freq: Four times a day (QID) | ORAL | Status: DC | PRN
  Administered 2015-09-17 – 2015-09-18 (×3): 200 mg via ORAL
  Filled 2015-09-17 (×3): qty 1

## 2015-09-17 MED ORDER — ALBUMIN HUMAN 25 % IV SOLN
12.5000 g | Freq: Two times a day (BID) | INTRAVENOUS | Status: DC
  Administered 2015-09-18: 12.5 g via INTRAVENOUS
  Filled 2015-09-17 (×3): qty 50

## 2015-09-17 MED ORDER — POTASSIUM CHLORIDE CRYS ER 20 MEQ PO TBCR
40.0000 meq | EXTENDED_RELEASE_TABLET | ORAL | Status: AC
  Administered 2015-09-17 – 2015-09-18 (×2): 40 meq via ORAL
  Filled 2015-09-17 (×2): qty 2

## 2015-09-17 MED ORDER — POTASSIUM CHLORIDE CRYS ER 10 MEQ PO TBCR
30.0000 meq | EXTENDED_RELEASE_TABLET | ORAL | Status: AC
  Administered 2015-09-17 (×2): 30 meq via ORAL
  Filled 2015-09-17 (×3): qty 1

## 2015-09-17 NOTE — Procedures (Signed)
Ultrasound-guided diagnostic and therapeutic paracentesis performed yielding 8.8  liters of light yellow  fluid. No immediate complications. A portion of the fluid was sent to the lab for preordered studies.

## 2015-09-17 NOTE — Progress Notes (Signed)
Dr Butler Denmarkizwan made aware patient has had multiple loose BMs but no urine.  Patient reports no pressure to bladder. Bladder scanned

## 2015-09-17 NOTE — Progress Notes (Signed)
PROGRESS NOTE    [name]  GEX:[id number] DOB: 1960/11/10 DOA: 09/16/2015  PCP: [name], MD       Brief Narrative:   55y/o with alcoholic cirrhosis, smoker, noncompliance, numerous paracentesis for ascites. He has been off his medications for 3 wks and homeless for 2 wks. He was staying with his brother in a Motel prior to being homeless. He was admitted due to extensive ascites and AKI.   Assessment & Plan:   Principal Problem:   AKI (acute kidney injury)  - baseline Cr < 1- Cr on admission 2.24 - has been off his diuretics for 3 wks- may be hepato-renal vs third spacing into abdomen and legs -  no urine ouput for 24 hrs now- start Midodrine Octreotide and Albumin - check U sodium and Fena when able to make urine  Active Problems: Severe ascites - needs repeated paracentesis- has one ordered for today- started on Rocephin for possible SBP - stopped Lasix and Aldactone 3 wks ago- will hold for now    Alcoholic cirrhosis of liver with ascites, elevated LFTs, thrombocytopenia and mild coagulopathy - PT 16.3, plt 55 - stopped drinking a few wks ago - is supposed to be on Lactulose as outpt but as he has not had encephalopathy without the Lactulose for the past 3 wks, he does not need it at this time - 55y/o has declined to speak with palliative care in the past    Hypokalemia - replacing  Acute bronchitis/ smoker - Cough with brown sputum x 3 days- start Doxycycline - no wheezing, hypoxia or dyspnea noted - smokes about 4-5 cig / day - advised to stop  GERD - heartburn much better after receiving Protonix last night     DVT prophylaxis: SCDS Code Status: Full code Family Communication:  Disposition Plan: to be determined   Consultants:   IR  Procedures:   none  Antimicrobials:  Anti-infectives    Start     Dose/Rate Route Frequency Ordered Stop   09/17/15 1000  doxycycline (VIBRA-TABS) tablet 100 mg     100 mg Oral Every 12 hours 09/17/15 0822     09/16/15 2230  cefTRIAXone (ROCEPHIN) 2 g in dextrose 5 % 50 mL IVPB     2 g 100 mL/hr over 30 Minutes Intravenous Every 24 hours 09/16/15 2222        Subjective: Diffuse pain across abdomen, heart burn daily which is better since receiving Protonix last night. Cough with brown sputum. No shortness of breath or chest pain.   Objective: Filed Vitals:   09/16/15 2142 09/16/15 2151 09/17/15 0553 09/17/15 1034  BP: 110/75  97/58 98/63  Pulse: 87  85 87  Temp: 97.5 F (36.4 C)  98.5 F (36.9 C) 97.5 F (36.4 C)  TempSrc: Oral  Oral Oral  Resp: Height:   (1.676 m)    Weight:  77.565 kg (171 lb)    SpO2: 100%  99% 100%    Intake/Output Summary (Last 24 hours) at 09/17/15 1301 Last data filed at 09/17/15 1030  Gross per 24 hour  Intake 2029.16 ml  Output      0 ml  Net 2029.16 ml   Filed Weights   09/16/15 2151  Weight: 77.565 kg (171 lb)    Examination: General exam: Appears comfortable  Respiratory system: Clear to auscultation. Respiratory effort normal. Cardiovascular system: S1 & S2 heard, RRR. No JVD, murmurs, rubs, gallops or clicks. No pedal edema. Gastrointestinal system: Abdomen  is severely distended, moderately diffusely tender. BS + Central nervous system: Alert and oriented. No focal neurological deficits. Extremities: Symmetric 5 x 5 power. 2+ pedal edema Skin: No rashes, lesions or ulcers Psychiatry: Judgement and insight appear normal. Mood & affect appropriate.     Data Reviewed: I have personally reviewed following labs and imaging studies  CBC:  Recent Labs Lab 09/10/15 2128 09/16/15 1719 09/17/15 0423  WBC 13.8* 13.4* 10.9*  NEUTROABS 10.3* 10.2*  --   HGB 14.6 12.6* 12.0*  HCT 40.3 33.7* 32.6*  MCV 95.3 92.3 93.4  PLT 47* 104* 55*   Basic Metabolic Panel:  Recent Labs Lab 09/10/15 2128 09/16/15 1719 09/17/15 0423  NA 124* 121* 123*  K 3.4* 3.0* 2.7*  CL 92* 89* 96*  CO2 22 21* 20*  GLUCOSE 123* 108* 94  BUN 18  50* 52*  CREATININE 0.95 2.24* 1.85*  CALCIUM 8.0* 7.8* 7.6*   GFR: Estimated Creatinine Clearance: 44.7 mL/min (by C-G formula based on Cr of 1.85). Liver Function Tests:  Recent Labs Lab 09/10/15 2128 09/16/15 1719  AST 51* 70*  ALT 32 40  ALKPHOS 89 79  BILITOT 2.4* 1.5*  PROT 5.4* 5.2*  ALBUMIN 2.2* 2.1*   No results for input(s): LIPASE, AMYLASE in the last 168 hours.  Recent Labs Lab 09/10/15 2128  AMMONIA 37*   Coagulation Profile:  Recent Labs Lab 09/16/15 1719  INR 1.36   Cardiac Enzymes: No results for input(s): CKTOTAL, CKMB, CKMBINDEX, TROPONINI in the last 168 hours. BNP (last 3 results) No results for input(s): PROBNP in the last 8760 hours. HbA1C: No results for input(s): HGBA1C in the last 72 hours. CBG: No results for input(s): GLUCAP in the last 168 hours. Lipid Profile: No results for input(s): CHOL, HDL, LDLCALC, TRIG, CHOLHDL, LDLDIRECT in the last 72 hours. Thyroid Function Tests: No results for input(s): TSH, T4TOTAL, FREET4, T3FREE, THYROIDAB in the last 72 hours. Anemia Panel: No results for input(s): VITAMINB12, FOLATE, FERRITIN, TIBC, IRON, RETICCTPCT in the last 72 hours. Urine analysis:    Component Value Date/Time   COLORURINE AMBER* 09/06/2015 0714   APPEARANCEUR CLEAR 09/06/2015 0714   LABSPEC 1.017 09/06/2015 0714   PHURINE 6.0 09/06/2015 0714   GLUCOSEU NEGATIVE 09/06/2015 0714   HGBUR TRACE* 09/06/2015 0714   BILIRUBINUR SMALL* 09/06/2015 0714   KETONESUR 15* 09/06/2015 0714   PROTEINUR NEGATIVE 09/06/2015 0714   UROBILINOGEN 0.2 04/11/2015 0800   NITRITE POSITIVE* 09/06/2015 0714   LEUKOCYTESUR SMALL* 09/06/2015 0714   Sepsis Labs: @LABRCNTIP (procalcitonin:4,lacticidven:4)  )No results found for this or any previous visit (from the past 240 hour(s)).       Radiology Studies: No results found.      Scheduled Meds: . albumin human  25 g Intravenous Once  . cefTRIAXone (ROCEPHIN)  IV  2 g Intravenous  Q24H  . doxycycline  100 mg Oral Q12H  . pantoprazole  40 mg Oral Daily  . thiamine  100 mg Oral Daily   Continuous Infusions:       Time spent in minutes: 45    Paidyn Mcferran, MD [facility] Pager: [email or web address] Password [email or web address] 09/17/2015, 1:01 PM

## 2015-09-17 NOTE — Hospital Discharge Follow-Up (Signed)
[facility]:  This Case Manager received communication from Radford PaxAmy Ferrero, RN CM indicating patient needing hospital follow-up appointment. Patient is homeless. This Case Manager spoke with patient and Lorenda IshiharaSuzanne Peele, RN CM and hospital follow-up appointment scheduled for 09/28/15 at 1500 with [facility] Venetia [facility]. AVS updated.

## 2015-09-17 NOTE — Progress Notes (Signed)
Lab called for a critical potassium value of 2.7, notified on call provider and orders was given. We will continue to monitor.

## 2015-09-18 DIAGNOSIS — K769 Liver disease, unspecified: Secondary | ICD-10-CM

## 2015-09-18 DIAGNOSIS — K7460 Unspecified cirrhosis of liver: Secondary | ICD-10-CM | POA: Insufficient documentation

## 2015-09-18 LAB — COMPREHENSIVE METABOLIC PANEL
ALT: 28 U/L (ref 17–63)
ANION GAP: 9 (ref 5–15)
AST: 41 U/L (ref 15–41)
Albumin: 2.2 g/dL — ABNORMAL LOW (ref 3.5–5.0)
Alkaline Phosphatase: 63 U/L (ref 38–126)
BILIRUBIN TOTAL: 0.8 mg/dL (ref 0.3–1.2)
BUN: 38 mg/dL — AB (ref 6–20)
CO2: 18 mmol/L — ABNORMAL LOW (ref 22–32)
Calcium: 7.7 mg/dL — ABNORMAL LOW (ref 8.9–10.3)
Chloride: 97 mmol/L — ABNORMAL LOW (ref 101–111)
Creatinine, Ser: 1.18 mg/dL (ref 0.61–1.24)
Glucose, Bld: 105 mg/dL — ABNORMAL HIGH (ref 65–99)
POTASSIUM: 3 mmol/L — AB (ref 3.5–5.1)
Sodium: 124 mmol/L — ABNORMAL LOW (ref 135–145)
TOTAL PROTEIN: 4.9 g/dL — AB (ref 6.5–8.1)

## 2015-09-18 LAB — CBC
HEMATOCRIT: 32 % — AB (ref 39.0–52.0)
HEMOGLOBIN: 11.7 g/dL — AB (ref 13.0–17.0)
MCH: 34.8 pg — AB (ref 26.0–34.0)
MCHC: 36.6 g/dL — AB (ref 30.0–36.0)
MCV: 95.2 fL (ref 78.0–100.0)
Platelets: 42 10*3/uL — ABNORMAL LOW (ref 150–400)
RBC: 3.36 MIL/uL — AB (ref 4.22–5.81)
RDW: 14.6 % (ref 11.5–15.5)
WBC: 7.6 10*3/uL (ref 4.0–10.5)

## 2015-09-18 LAB — GRAM STAIN

## 2015-09-18 LAB — MAGNESIUM: Magnesium: 1.6 mg/dL — ABNORMAL LOW (ref 1.7–2.4)

## 2015-09-18 MED ORDER — MAGNESIUM SULFATE 2 GM/50ML IV SOLN
2.0000 g | Freq: Once | INTRAVENOUS | Status: AC
  Administered 2015-09-18: 2 g via INTRAVENOUS
  Filled 2015-09-18: qty 50

## 2015-09-18 MED ORDER — PANTOPRAZOLE SODIUM 40 MG PO TBEC
40.0000 mg | DELAYED_RELEASE_TABLET | Freq: Two times a day (BID) | ORAL | Status: DC
  Administered 2015-09-18 – 2015-09-20 (×4): 40 mg via ORAL
  Filled 2015-09-18 (×6): qty 1

## 2015-09-18 MED ORDER — POTASSIUM CHLORIDE CRYS ER 20 MEQ PO TBCR
40.0000 meq | EXTENDED_RELEASE_TABLET | ORAL | Status: DC
  Administered 2015-09-18 – 2015-09-20 (×11): 40 meq via ORAL
  Filled 2015-09-18 (×20): qty 2

## 2015-09-18 MED ORDER — POTASSIUM CHLORIDE CRYS ER 20 MEQ PO TBCR: 40.0000 meq | EXTENDED_RELEASE_TABLET | ORAL | Status: DC | PRN

## 2015-09-18 MED ORDER — CALCIUM CARBONATE ANTACID 500 MG PO CHEW
1.0000 | CHEWABLE_TABLET | Freq: Three times a day (TID) | ORAL | Status: DC
  Administered 2015-09-18 – 2015-09-22 (×16): 200 mg via ORAL
  Filled 2015-09-18 (×24): qty 1

## 2015-09-18 NOTE — Progress Notes (Addendum)
PROGRESS NOTE    [name]  WUJ:[id number] DOB: March 18, 1961 DOA: 09/16/2015  PCP: [name], MD   Brief Narrative:   54y/o with alcoholic cirrhosis, smoker, noncompliance, numerous paracentesis for ascites. He has been off his medications for 3 wks and homeless for 2 wks. He was staying with his brother in a Motel prior to being homeless. He was admitted due to extensive ascites and AKI.   Assessment & Plan:   Principal Problem:   AKI  - hyponatremia- pedal edema - AKI is prerenal and due to U retention - baseline Cr < 1- Cr on admission 2.24 - has been off his diuretics for 3 wks  - U sodium < 10 and FeNa low - suggests prerenal cause - Cr normalized with IVF and placement of foley cath 4/21- had close to 1.5 L of urine as soon as foley was placed - give voiding trial today - serum sodium still 124- maintaining fluid balance is very difficult in him- will have to stop IVF and albumin infusions today to prevent further third spacing  - as he is homeless, has limited access to water and presented with dehydration therefore, not sure what dose to resume diuretics at  Active Problems: Severe ascites - s/p 8 L paracentesis on 4/21- albumin given- started on empiric Rocephin for possible SBP - ascitic fluid negative for SBP- stop Rocephin today - stopped Lasix and Aldactone 3 wks ago- will hold for now as he had pre-renal AKI-  - he refuses TEDS for his pedal edema as they 'cut of my circulation'.     Alcoholic cirrhosis of liver with ascites, elevated LFTs, thrombocytopenia and mild coagulopathy - PT 16.3, plt 55 - stopped drinking a few wks ago - is supposed to be on Lactulose as outpt but as he has not had encephalopathy without the Lactulose for the past 3 wks, he does not need it at this time - has declined to speak with palliative care in the past    Hypokalemia/ hypomag - replacing  Acute bronchitis/ smoker - Cough with brown sputum x 3 days- start Doxycycline - no  wheezing, hypoxia or dyspnea noted - smokes about 4-5 cig / day - advised to stop  GERD - heartburn and reflux today- drinking Coke and plans to order a cheese burger for lunch - discussed dietary changes needed to control GERD - cont Protonix     DVT prophylaxis: SCDS Code Status: Full code Family Communication:  Disposition Plan: homeless   Consultants:   IR  Procedures:   none  Antimicrobials:  Anti-infectives    Start     Dose/Rate Route Frequency Ordered Stop   09/17/15 1000  doxycycline (VIBRA-TABS) tablet 100 mg     100 mg Oral Every 12 hours 09/17/15 0822     09/16/15 2230  cefTRIAXone (ROCEPHIN) 2 g in dextrose 5 % 50 mL IVPB  Status:  Discontinued     2 g 100 mL/hr over 30 Minutes Intravenous Every 24 hours 09/16/15 2222 09/17/15 2225      Subjective: Diffuse pain across abdomen, heart burn daily which is better since receiving Protonix last night. Cough with brown sputum. No shortness of breath or chest pain.   Objective: Filed Vitals:   09/17/15 2132 09/18/15 0300 09/18/15 0354 09/18/15 0504  BP: 96/80  86/59 90/56  Pulse: 77  82 83  Temp: 98.6 F (37 C)  98.2 F (36.8 C) 98.3 F (36.8 C)  TempSrc: Oral  Oral Oral  Resp: 14  16 14  Height:      Weight:  63.549 kg (140 lb 1.6 oz)    SpO2: 99%  100% 100%    Intake/Output Summary (Last 24 hours) at 09/18/15 1239 Last data filed at 09/18/15 0500  Gross per 24 hour  Intake     50 ml  Output   2350 ml  Net  -2300 ml   Filed Weights   09/16/15 2151 09/18/15 0300  Weight: 77.565 kg (171 lb) 63.549 kg (140 lb 1.6 oz)    Examination: General exam: Appears comfortable  Respiratory system: Clear to auscultation. Respiratory effort normal. Cardiovascular system: S1 & S2 heard, RRR. No JVD, murmurs, rubs, gallops or clicks. No pedal edema. Gastrointestinal system: Abdomen is severely distended, moderately diffusely tender. BS + Central nervous system: Alert and oriented. No focal neurological  deficits. Extremities: Symmetric 5 x 5 power. 2+ pedal edema Skin: No rashes, lesions or ulcers Psychiatry: Judgement and insight appear normal. Mood & affect appropriate.     Data Reviewed: I have personally reviewed following labs and imaging studies  CBC:  Recent Labs Lab 09/16/15 1719 09/17/15 0423 09/18/15 0435  WBC 13.4* 10.9* 7.6  NEUTROABS 10.2*  --   --   HGB 12.6* 12.0* 11.7*  HCT 33.7* 32.6* 32.0*  MCV 92.3 93.4 95.2  PLT 104* 55* 42*   Basic Metabolic Panel:  Recent Labs Lab 09/16/15 1719 09/17/15 0423 09/17/15 1529 09/18/15 0435  NA 121* 123* 124* 124*  K 3.0* 2.7* 3.3* 3.0*  CL 89* 96* 96* 97*  CO2 21* 20* 18* 18*  GLUCOSE 108* 94 130* 105*  BUN 50* 52* 47* 38*  CREATININE 2.24* 1.85* 1.52* 1.18  CALCIUM 7.8* 7.6* 7.8* 7.7*  MG  --   --   --  1.6*   GFR: Estimated Creatinine Clearance: 64.3 mL/min (by C-G formula based on Cr of 1.18). Liver Function Tests:  Recent Labs Lab 09/16/15 1719 09/17/15 1529 09/18/15 0435  AST 70* 56* 41  ALT 40 36 28  ALKPHOS 79 79 63  BILITOT 1.5* 1.0 0.8  PROT 5.2* 5.2* 4.9*  ALBUMIN 2.1* 2.0* 2.2*   No results for input(s): LIPASE, AMYLASE in the last 168 hours. No results for input(s): AMMONIA in the last 168 hours. Coagulation Profile:  Recent Labs Lab 09/16/15 1719  INR 1.36   Cardiac Enzymes: No results for input(s): CKTOTAL, CKMB, CKMBINDEX, TROPONINI in the last 168 hours. BNP (last 3 results) No results for input(s): PROBNP in the last 8760 hours. HbA1C: No results for input(s): HGBA1C in the last 72 hours. CBG: No results for input(s): GLUCAP in the last 168 hours. Lipid Profile: No results for input(s): CHOL, HDL, LDLCALC, TRIG, CHOLHDL, LDLDIRECT in the last 72 hours. Thyroid Function Tests: No results for input(s): TSH, T4TOTAL, FREET4, T3FREE, THYROIDAB in the last 72 hours. Anemia Panel: No results for input(s): VITAMINB12, FOLATE, FERRITIN, TIBC, IRON, RETICCTPCT in the last 72  hours. Urine analysis:    Component Value Date/Time   COLORURINE AMBER* 09/06/2015 0714   APPEARANCEUR CLEAR 09/06/2015 0714   LABSPEC 1.017 09/06/2015 0714   PHURINE 6.0 09/06/2015 0714   GLUCOSEU NEGATIVE 09/06/2015 0714   HGBUR TRACE* 09/06/2015 0714   BILIRUBINUR SMALL* 09/06/2015 0714   KETONESUR 15* 09/06/2015 0714   PROTEINUR NEGATIVE 09/06/2015 0714   UROBILINOGEN 0.2 04/11/2015 0800   NITRITE POSITIVE* 09/06/2015 0714   LEUKOCYTESUR SMALL* 09/06/2015 0714   Sepsis Labs: (procalcitonin:4,lacticidven:4)  ) Recent Results (from the past 240 hour(s))  Culture, body fluid-bottle     Status: None (Preliminary result)   Collection Time: 09/17/15  2:36 PM  Result Value Ref Range Status   Specimen Description FLUID PERITONEAL  Final   Special Requests BOTTLES DRAWN AEROBIC AND ANAEROBIC 10CC  Final   Culture   Final    NO GROWTH < 12 HOURS [facility]    Report Status PENDING  Incomplete  Gram stain     Status: None   Collection Time: 09/17/15  2:36 PM  Result Value Ref Range Status   Specimen Description FLUID PERITONEAL  Final   Special Requests NONE  Final   Gram Stain   Final    CYTOSPIN SMEAR WBC PRESENT, PREDOMINANTLY MONONUCLEAR NO ORGANISMS SEEN Performed at [facility]    Report Status 09/18/2015 FINAL  Final         Radiology Studies: [place] Paracentesis  09/17/2015  INDICATION: Cirrhosis, recurrent ascites. Request made for diagnostic and therapeutic paracentesis. EXAM: ULTRASOUND GUIDED DIAGNOSTIC AND THERAPEUTIC  PARACENTESIS MEDICATIONS: None. COMPLICATIONS: None immediate. PROCEDURE: Informed written consent was obtained from the patient after a discussion of the risks, benefits and alternatives to treatment. A timeout was performed prior to the initiation of the procedure. Initial ultrasound scanning demonstrates a large amount of ascites within the right lower abdominal quadrant. The right lower abdomen was  prepped and draped in the usual sterile fashion. 1% lidocaine was used for local anesthesia. Following this, a Yueh catheter was introduced. An ultrasound image was saved for documentation purposes. The paracentesis was performed. The catheter was removed and a dressing was applied. The patient tolerated the procedure well without immediate post procedural complication. FINDINGS: A total of approximately 8.8 liters of light yellow fluid was removed. Samples were sent to the laboratory as requested by the clinical team. IMPRESSION: Successful ultrasound-guided diagnostic and therapeutic paracentesis yielding 8.8 liters of peritoneal fluid. Read by: Jeananne RamaKevin Allred, PA-C Electronically Signed   By: Irish LackGlenn  Yamagata M.D.   On: 09/17/2015 15:22        Scheduled Meds: . doxycycline  100 mg Oral Q12H  . feeding supplement (PRO-STAT SUGAR FREE 64)  30 mL Oral BID  . pantoprazole  40 mg Oral Daily  . potassium chloride  40 mEq Oral Q4H  . thiamine  100 mg Oral Daily   Continuous Infusions:    LOS: 1 day    Time spent in minutes: 45    Tawonna Esquer, MD [facility] Pager: [email or web address] Password [email or web address] 09/18/2015, 12:39 PM

## 2015-09-19 LAB — BASIC METABOLIC PANEL
Anion gap: 6 (ref 5–15)
BUN: 22 mg/dL — AB (ref 6–20)
CALCIUM: 8.1 mg/dL — AB (ref 8.9–10.3)
CHLORIDE: 103 mmol/L (ref 101–111)
CO2: 21 mmol/L — AB (ref 22–32)
CREATININE: 0.88 mg/dL (ref 0.61–1.24)
GFR calc Af Amer: >60 mL/min (ref >60)
GFR calc non Af Amer: >60 mL/min (ref >60)
Glucose, Bld: 115 mg/dL — ABNORMAL HIGH (ref 65–99)
Potassium: 4.4 mmol/L (ref 3.5–5.1)
Sodium: 130 mmol/L — ABNORMAL LOW (ref 135–145)

## 2015-09-19 LAB — CBC
HCT: 33.7 % — ABNORMAL LOW (ref 39.0–52.0)
Hemoglobin: 11.9 g/dL — ABNORMAL LOW (ref 13.0–17.0)
MCH: 34.4 pg — AB (ref 26.0–34.0)
MCHC: 35.3 g/dL (ref 30.0–36.0)
MCV: 97.4 fL (ref 78.0–100.0)
PLATELETS: 69 10*3/uL — AB (ref 150–400)
RBC: 3.46 MIL/uL — ABNORMAL LOW (ref 4.22–5.81)
RDW: 15 % (ref 11.5–15.5)
WBC: 9.9 10*3/uL (ref 4.0–10.5)

## 2015-09-19 LAB — MAGNESIUM: MAGNESIUM: 1.6 mg/dL — AB (ref 1.7–2.4)

## 2015-09-19 MED ORDER — FENTANYL CITRATE (PF) 100 MCG/2ML IJ SOLN
12.5000 ug | Freq: Once | INTRAMUSCULAR | Status: AC
  Administered 2015-09-19: 12.5 ug via INTRAVENOUS
  Filled 2015-09-19: qty 2

## 2015-09-19 MED ORDER — MAGNESIUM SULFATE 2 GM/50ML IV SOLN
2.0000 g | Freq: Once | INTRAVENOUS | Status: AC
  Administered 2015-09-19: 2 g via INTRAVENOUS
  Filled 2015-09-19: qty 50

## 2015-09-19 NOTE — Progress Notes (Signed)
PROGRESS NOTE    [name]  EAV:[id number] DOB: 04-16-1961 DOA: 09/16/2015  PCP: [name], MD   Brief Narrative:   55y/o with alcoholic cirrhosis, smoker, noncompliance, numerous paracentesis for ascites. He has been off his medications for 3 wks and homeless for 2 wks. He was staying with his brother in a Motel prior to being homeless. He was admitted due to extensive ascites and AKI.   Assessment & Plan:   Principal Problem:   AKI  - hyponatremia- pedal edema - AKI is prerenal and due to U retention - baseline Cr < 1- Cr on admission 2.24 - has been off his diuretics for 3 wks  - U sodium < 10 and FeNa low - suggests prerenal cause - Cr normalized with IVF and placement of foley cath 4/21- had close to 1.5 L of urine as soon as foley was placed - give voiding trial today - serum sodium 130 - maintaining fluid balance is very difficult in him-  stopped IVF and albumin infusions 4/22 - as he is homeless, has limited access to water and presented with dehydration therefore, not sure what dose to resume diuretics at- social work is trying to get [facility] to [facility]  Active Problems: Severe ascites - s/p 8 L paracentesis on 4/21- albumin given- started on empiric Rocephin for possible SBP - ascitic fluid negative for SBP- stop Rocephin   - stopped Lasix and Aldactone 3 wks ago- will hold for now as he had pre-renal AKI-  - he refuses TEDS for his pedal edema as they 'cut of my circulation'.     Alcoholic cirrhosis of liver with ascites, elevated LFTs, thrombocytopenia and mild coagulopathy - PT 16.3, plt 55 - stopped drinking a few wks ago - is supposed to be on Lactulose as outpt but as he has not had encephalopathy without the Lactulose for the past 3 wks, he does not need it at this time - has declined to speak with palliative care in the past- he tells me he knows he will die soon    Hypokalemia/ hypomag - replacing  Acute bronchitis/ smoker - Cough with brown sputum x  3 days- started Doxycycline - no wheezing, hypoxia or dyspnea noted - smokes about 4-5 cig / day - advised to stop  GERD - heartburn and reflux  - was drinking Coke and planned to order a cheese burger for lunch - discussed dietary changes needed to control GERD - cont Protonix BID temporarily and TUMS  DVT prophylaxis: SCDS Code Status: Full code Family Communication:  Disposition Plan: homeless   Consultants:   IR  Procedures:   none  Antimicrobials:  Anti-infectives    Start     Dose/Rate Route Frequency Ordered Stop   09/17/15 1000  doxycycline (VIBRA-TABS) tablet 100 mg     100 mg Oral Every 12 hours 09/17/15 0822     09/16/15 2230  cefTRIAXone (ROCEPHIN) 2 g in dextrose 5 % 50 mL IVPB  Status:  Discontinued     2 g 100 mL/hr over 30 Minutes Intravenous Every 24 hours 09/16/15 2222 09/17/15 2225      Subjective: States he is having diarrhea 4 episodes this AM. I have asked tech to collect stool in hat and have asked RN to look at it this AM. RN states his stool is formed.  No abdominal pain. Reflux comes and goes. He is avoiding fatty, oily food and carbonated drinks as I advised.   Objective: Filed Vitals:   09/18/15  [id number] 09/18/15 1437 09/18/15 2150 09/19/15 0520  BP: 90/56 93/67 97/69  96/67  Pulse: 83 60 97 89  Temp: 98.3 F (36.8 C) 98.1 F (36.7 C) 98.2 F (36.8 C) 98 F (36.7 C)  TempSrc: Oral Oral Oral Oral  Resp: 14 14 18 18   Height:      Weight:      SpO2: 100% 100% 99% 96%    Intake/Output Summary (Last 24 hours) at 09/19/15 1258 Last data filed at 09/19/15 1222  Gross per 24 hour  Intake    480 ml  Output   1300 ml  Net   -820 ml   Filed Weights   09/16/15 2151 09/18/15 0300  Weight: 77.565 kg (171 lb) 63.549 kg (140 lb 1.6 oz)    Examination: General exam: Appears comfortable  Respiratory system: Clear to auscultation. Respiratory effort normal. Cardiovascular system: S1 & S2 heard, RRR. No JVD, murmurs, rubs, gallops or clicks. No  pedal edema. Gastrointestinal system: Abdomen is moderately distended, mostly tympanic today, non tender BS + Central nervous system: Alert and oriented. No focal neurological deficits. Extremities: Symmetric 5 x 5 power. 2+ pedal edema Skin: No rashes, lesions or ulcers Psychiatry: Judgement and insight appear normal. Mood & affect appropriate.     Data Reviewed: I have personally reviewed following labs and imaging studies  CBC:  Recent Labs Lab 09/16/15 1719 09/17/15 0423 09/18/15 0435 09/19/15 0440  WBC 13.4* 10.9* 7.6 9.9  NEUTROABS 10.2*  --   --   --   HGB 12.6* 12.0* 11.7* 11.9*  HCT 33.7* 32.6* 32.0* 33.7*  MCV 92.3 93.4 95.2 97.4  PLT 104* 55* 42* 69*   Basic Metabolic Panel:  Recent Labs Lab 09/16/15 1719 09/17/15 0423 09/17/15 1529 09/18/15 0435 09/19/15 0440  NA 121* 123* 124* 124* 130*  K 3.0* 2.7* 3.3* 3.0* 4.4  CL 89* 96* 96* 97* 103  CO2 21* 20* 18* 18* 21*  GLUCOSE 108* 94 130* 105* 115*  BUN 50* 52* 47* 38* 22*  CREATININE 2.24* 1.85* 1.52* 1.18 0.88  CALCIUM 7.8* 7.6* 7.8* 7.7* 8.1*  MG  --   --   --  1.6* 1.6*   GFR: Estimated Creatinine Clearance: 86.2 mL/min (by C-G formula based on Cr of 0.88). Liver Function Tests:  Recent Labs Lab 09/16/15 1719 09/17/15 1529 09/18/15 0435  AST 70* 56* 41  ALT 40 36 28  ALKPHOS 79 79 63  BILITOT 1.5* 1.0 0.8  PROT 5.2* 5.2* 4.9*  ALBUMIN 2.1* 2.0* 2.2*   No results for input(s): LIPASE, AMYLASE in the last 168 hours. No results for input(s): AMMONIA in the last 168 hours. Coagulation Profile:  Recent Labs Lab 09/16/15 1719  INR 1.36   Cardiac Enzymes: No results for input(s): CKTOTAL, CKMB, CKMBINDEX, TROPONINI in the last 168 hours. BNP (last 3 results) No results for input(s): PROBNP in the last 8760 hours. HbA1C: No results for input(s): HGBA1C in the last 72 hours. CBG: No results for input(s): GLUCAP in the last 168 hours. Lipid Profile: No results for input(s): CHOL, HDL,  LDLCALC, TRIG, CHOLHDL, LDLDIRECT in the last 72 hours. Thyroid Function Tests: No results for input(s): TSH, T4TOTAL, FREET4, T3FREE, THYROIDAB in the last 72 hours. Anemia Panel: No results for input(s): VITAMINB12, FOLATE, FERRITIN, TIBC, IRON, RETICCTPCT in the last 72 hours. Urine analysis:    Component Value Date/Time   COLORURINE AMBER* 09/06/2015 0714   APPEARANCEUR CLEAR 09/06/2015 0714   LABSPEC 1.017 09/06/2015 0714   PHURINE 6.0  09/06/2015 0714   GLUCOSEU NEGATIVE 09/06/2015 0714   HGBUR TRACE* 09/06/2015 0714   BILIRUBINUR SMALL* 09/06/2015 0714   KETONESUR 15* 09/06/2015 0714   PROTEINUR NEGATIVE 09/06/2015 0714   UROBILINOGEN 0.2 04/11/2015 0800   NITRITE POSITIVE* 09/06/2015 0714   LEUKOCYTESUR SMALL* 09/06/2015 0714   Sepsis Labs: (procalcitonin:4,lacticidven:4)  ) Recent Results (from the past 240 hour(s))  Culture, body fluid-bottle     Status: None (Preliminary result)   Collection Time: 09/17/15  2:36 PM  Result Value Ref Range Status   Specimen Description FLUID PERITONEAL  Final   Special Requests BOTTLES DRAWN AEROBIC AND ANAEROBIC 10CC  Final   Culture   Final    NO GROWTH < 12 HOURS Performed at [facility]    Report Status PENDING  Incomplete  Gram stain     Status: None   Collection Time: 09/17/15  2:36 PM  Result Value Ref Range Status   Specimen Description FLUID PERITONEAL  Final   Special Requests NONE  Final   Gram Stain   Final    CYTOSPIN SMEAR WBC PRESENT, PREDOMINANTLY MONONUCLEAR NO ORGANISMS SEEN Performed at [facility]    Report Status 09/18/2015 FINAL  Final         Radiology Studies: US Paracentesis  09/17/2015  INDICATION: Cirrhosis, recurrent ascites. Request made for diagnostic and therapeutic paracentesis. EXAM: ULTRASOUND GUIDED DIAGNOSTIC AND THERAPEUTIC  PARACENTESIS MEDICATIONS: None. COMPLICATIONS: None immediate. PROCEDURE: Informed written consent was obtained from the patient after  a discussion of the risks, benefits and alternatives to treatment. A timeout was performed prior to the initiation of the procedure. Initial ultrasound scanning demonstrates a large amount of ascites within the right lower abdominal quadrant. The right lower abdomen was prepped and draped in the usual sterile fashion. 1% lidocaine was used for local anesthesia. Following this, a Yueh catheter was introduced. An ultrasound image was saved for documentation purposes. The paracentesis was performed. The catheter was removed and a dressing was applied. The patient tolerated the procedure well without immediate post procedural complication. FINDINGS: A total of approximately 8.8 liters of light yellow fluid was removed. Samples were sent to the laboratory as requested by the clinical team. IMPRESSION: Successful ultrasound-guided diagnostic and therapeutic paracentesis yielding 8.8 liters of peritoneal fluid. Read by: Jeananne Rama, PA-C Electronically Signed   By: Irish Lack M.D.   On: 09/17/2015 15:22        Scheduled Meds: . calcium carbonate  1 tablet Oral TID AC & HS  . doxycycline  100 mg Oral Q12H  . feeding supplement (PRO-STAT SUGAR FREE 64)  30 mL Oral BID  . pantoprazole  40 mg Oral BID AC  . potassium chloride  40 mEq Oral Q4H  . thiamine  100 mg Oral Daily   Continuous Infusions:    LOS: 2 days    Time spent in minutes: 45    Arwen Haseley, MD [facility] Pager: [email or web address] Password [email or web address] 09/19/2015, 12:58 PM

## 2015-09-20 DIAGNOSIS — E43 Unspecified severe protein-calorie malnutrition: Secondary | ICD-10-CM | POA: Insufficient documentation

## 2015-09-20 DIAGNOSIS — E875 Hyperkalemia: Secondary | ICD-10-CM

## 2015-09-20 LAB — BASIC METABOLIC PANEL
ANION GAP: 3 — AB (ref 5–15)
BUN: 23 mg/dL — ABNORMAL HIGH (ref 6–20)
CO2: 21 mmol/L — AB (ref 22–32)
Calcium: 8.3 mg/dL — ABNORMAL LOW (ref 8.9–10.3)
Chloride: 104 mmol/L (ref 101–111)
Creatinine, Ser: 0.73 mg/dL (ref 0.61–1.24)
GFR calc Af Amer: >60 mL/min (ref >60)
GFR calc non Af Amer: >60 mL/min (ref >60)
GLUCOSE: 105 mg/dL — AB (ref 65–99)
POTASSIUM: 6.1 mmol/L — AB (ref 3.5–5.1)
Sodium: 128 mmol/L — ABNORMAL LOW (ref 135–145)

## 2015-09-20 LAB — CBC
HEMATOCRIT: 35.3 % — AB (ref 39.0–52.0)
Hemoglobin: 12.3 g/dL — ABNORMAL LOW (ref 13.0–17.0)
MCH: 34.6 pg — AB (ref 26.0–34.0)
MCHC: 34.8 g/dL (ref 30.0–36.0)
MCV: 99.2 fL (ref 78.0–100.0)
Platelets: 90 10*3/uL — ABNORMAL LOW (ref 150–400)
RBC: 3.56 MIL/uL — ABNORMAL LOW (ref 4.22–5.81)
RDW: 15.4 % (ref 11.5–15.5)
WBC: 10.7 10*3/uL — ABNORMAL HIGH (ref 4.0–10.5)

## 2015-09-20 LAB — PATHOLOGIST SMEAR REVIEW

## 2015-09-20 MED ORDER — PANTOPRAZOLE SODIUM 40 MG IV SOLR
40.0000 mg | Freq: Two times a day (BID) | INTRAVENOUS | Status: DC
  Administered 2015-09-20 – 2015-09-21 (×3): 40 mg via INTRAVENOUS
  Filled 2015-09-20 (×4): qty 40

## 2015-09-20 MED ORDER — FUROSEMIDE 10 MG/ML IJ SOLN
40.0000 mg | Freq: Once | INTRAMUSCULAR | Status: AC
  Administered 2015-09-20: 40 mg via INTRAVENOUS
  Filled 2015-09-20: qty 4

## 2015-09-20 MED ORDER — DIPHENOXYLATE-ATROPINE 2.5-0.025 MG PO TABS
2.0000 | ORAL_TABLET | Freq: Once | ORAL | Status: AC
  Administered 2015-09-20: 2 via ORAL
  Filled 2015-09-20: qty 2

## 2015-09-20 MED ORDER — PANCRELIPASE (LIP-PROT-AMYL) 12000-38000 UNITS PO CPEP
24000.0000 [IU] | ORAL_CAPSULE | Freq: Three times a day (TID) | ORAL | Status: DC
  Administered 2015-09-20 (×2): 24000 [IU] via ORAL
  Filled 2015-09-20 (×3): qty 2

## 2015-09-20 MED ORDER — FENTANYL CITRATE (PF) 100 MCG/2ML IJ SOLN
12.5000 ug | Freq: Once | INTRAMUSCULAR | Status: AC
  Administered 2015-09-20: 12.5 ug via INTRAVENOUS
  Filled 2015-09-20: qty 2

## 2015-09-20 MED ORDER — ENSURE ENLIVE PO LIQD
237.0000 mL | Freq: Three times a day (TID) | ORAL | Status: DC
  Administered 2015-09-20 – 2015-09-22 (×4): 237 mL via ORAL

## 2015-09-20 MED ORDER — DICLOFENAC SODIUM 1 % TD GEL
4.0000 g | Freq: Four times a day (QID) | TRANSDERMAL | Status: DC
  Administered 2015-09-20 – 2015-09-22 (×8): 4 g via TOPICAL
  Filled 2015-09-20: qty 100

## 2015-09-20 MED ORDER — FUROSEMIDE 10 MG/ML IJ SOLN
20.0000 mg | Freq: Two times a day (BID) | INTRAMUSCULAR | Status: DC
  Administered 2015-09-20 – 2015-09-21 (×3): 20 mg via INTRAVENOUS
  Filled 2015-09-20 (×5): qty 2

## 2015-09-20 MED ORDER — ZOLPIDEM TARTRATE 5 MG PO TABS
5.0000 mg | ORAL_TABLET | Freq: Once | ORAL | Status: AC
  Administered 2015-09-21: 5 mg via ORAL
  Filled 2015-09-20: qty 1

## 2015-09-20 NOTE — Progress Notes (Signed)
CRITICAL VALUE ALERT  Critical value received:  6.1 Potassium   Date of notification:  09/20/2015  Time of notification: 0932   Critical value read back: Yes  Nurse who received alert:  Darl PikesSusan    MD notified (1st page): Rizwan  Time of first page:  0934  MD notified (2nd page):  Time of second page:  Responding MD:  Butler Denmarkizwan  Time MD responded: [phone]

## 2015-09-20 NOTE — Progress Notes (Signed)
PROGRESS NOTE    [name]  ZOX:[id number] DOB: 16-Jun-1960 DOA: 09/16/2015  PCP: [name], MD   Brief Narrative:   54y/o with alcoholic cirrhosis, smoker, noncompliance, numerous paracentesis for ascites. He has been off his medications for 3 wks and homeless for 2 wks. He was staying with his brother in a Motel prior to being homeless. He was admitted due to extensive ascites and AKI.   Assessment & Plan:   Principal Problem:   AKI  - hyponatremia- pedal edema - AKI is prerenal and due to U retention - baseline Cr < 1- Cr on admission 2.24 - has been off his diuretics for 3 wks  - U sodium < 10 and FeNa low - suggests prerenal cause - Cr normalized with IVF and placement of foley cath 4/21- had close to 1.5 L of urine as soon as foley was placed - give voiding trial today - serum sodium 130 - maintaining fluid balance is very difficult in him-  stopped IVF and albumin infusions 4/22 - as he is homeless, has limited access to water and presented with dehydration therefore, not sure what dose to resume diuretics at- social work is trying to get [facility] to [facility]  Active Problems: Severe ascites - s/p 8 L paracentesis on 4/21- albumin given- started on empiric Rocephin for possible SBP - ascitic fluid negative for SBP- stop Rocephin   - stopped Lasix and Aldactone 3 wks ago- will carefully resume today - he refuses TEDS for his pedal edema as they 'cut of my circulation'.     Alcoholic cirrhosis of liver with ascites, elevated LFTs, thrombocytopenia and mild coagulopathy - PT 16.3, plt 55 - stopped drinking a few wks ago - is supposed to be on Lactulose as outpt but as he has not had encephalopathy without the Lactulose for the past 3 wks, he does not need it at this time - has declined to speak with palliative care in the past- he tells me he knows he will die soon  Hyperkalemia - due to K replacement- EKG OK- should improve with Lasix and his ongoing diarrhea- recheck  today  Acute bronchitis/ smoker - Cough with brown sputum x 3 days- started Doxycycline - no wheezing, hypoxia or dyspnea noted- brown sputum resolved- cough now persists due to GERD- stop Doxycycline - smokes about 4-5 cig / day - advised to stop  GERD - heartburn and reflux  - was drinking Coke and planned to order a cheese burger for lunch - discussed dietary changes needed to control GERD- he is doing well now with adhering to diet - cont Protonix BID temporarily and TUMS  Diarrhea - due to Protonix? Pancreatitic insufficieny? (chronic alcohol abuse)- per RN some of the food looks undigested- try pancreatic enzymes  DVT prophylaxis: SCDS Code Status: Full code Family Communication:  Disposition Plan: homeless   Consultants:   IR  Procedures:   none  Antimicrobials:  Anti-infectives    Start     Dose/Rate Route Frequency Ordered Stop   09/17/15 1000  doxycycline (VIBRA-TABS) tablet 100 mg  Status:  Discontinued     100 mg Oral Every 12 hours 09/17/15 0822 09/20/15 1106   09/16/15 2230  cefTRIAXone (ROCEPHIN) 2 g in dextrose 5 % 50 mL IVPB  Status:  Discontinued     2 g 100 mL/hr over 30 Minutes Intravenous Every 24 hours 09/16/15 2222 09/17/15 2225      Subjective: States he is having diarrhea 4 episodes this AM. I  have asked tech to collect stool in hat and have asked RN to look at it this AM. RN states his stool is formed.  No abdominal pain. Reflux comes and goes. He is avoiding fatty, oily food and carbonated drinks as I advised.   Objective: Filed Vitals:   09/19/15 0520 09/19/15 1348 09/19/15 2126 09/20/15 0420  BP: 97/61  Pulse: 89 86 82 95  Temp: 98 F (36.7 C) 97.7 F (36.5 C) 98.5 F (36.9 C) 98 F (36.7 C)  TempSrc: Oral Oral Oral Oral  Resp: Height:      Weight:      SpO2: 96% 100% 100% 99%    Intake/Output Summary (Last 24 hours) at 09/20/15 1244 Last data filed at 09/20/15 1030  Gross per 24 hour  Intake     360 ml  Output   1325 ml  Net   -965 ml   Filed Weights   09/16/15 2151 09/18/15 0300  Weight: 77.565 kg (171 lb) 63.549 kg (140 lb 1.6 oz)    Examination: General exam: Appears comfortable  Respiratory system: Clear to auscultation. Respiratory effort normal. Cardiovascular system: S1 & S2 heard, RRR. No JVD, murmurs, rubs, gallops or clicks. No pedal edema. Gastrointestinal system: Abdomen is moderately distended, mostly tympanic today, non tender BS + Central nervous system: Alert and oriented. No focal neurological deficits. Extremities: Symmetric 5 x 5 power. 2+ pedal edema Skin: No rashes, lesions or ulcers Psychiatry: Judgement and insight appear normal. Mood & affect appropriate.     Data Reviewed: I have personally reviewed following labs and imaging studies  CBC:  Recent Labs Lab 09/16/15 1719 09/17/15 0423 09/18/15 0435 09/19/15 0440 09/20/15 0841  WBC 13.4* 10.9* 7.6 9.9 10.7*  NEUTROABS 10.2*  --   --   --   --   HGB 12.6* 12.0* 11.7* 11.9* 12.3*  HCT 33.7* 32.6* 32.0* 33.7* 35.3*  MCV 92.3 93.4 95.2 97.4 99.2  PLT 104* 55* 42* 69* 90*   Basic Metabolic Panel:  Recent Labs Lab 09/17/15 0423 09/17/15 1529 09/18/15 0435 09/19/15 0440 09/20/15 0841  NA 123* 124* 124* 130* 128*  K 2.7* 3.3* 3.0* 4.4 6.1*  CL 96* 96* 97* 103 104  CO2 20* 18* 18* 21* 21*  GLUCOSE 94 130* 105* 115* 105*  BUN 52* 47* 38* 22* 23*  CREATININE 1.85* 1.52* 1.18 0.88 0.73  CALCIUM 7.6* 7.8* 7.7* 8.1* 8.3*  MG  --   --  1.6* 1.6*  --    GFR: Estimated Creatinine Clearance: 94.8 mL/min (by C-G formula based on Cr of 0.73). Liver Function Tests:  Recent Labs Lab 09/16/15 1719 09/17/15 1529 09/18/15 0435  AST 70* 56* 41  ALT 40 36 28  ALKPHOS 79 79 63  BILITOT 1.5* 1.0 0.8  PROT 5.2* 5.2* 4.9*  ALBUMIN 2.1* 2.0* 2.2*   No results for input(s): LIPASE, AMYLASE in the last 168 hours. No results for input(s): AMMONIA in the last 168 hours. Coagulation  Profile:  Recent Labs Lab 09/16/15 1719  INR 1.36   Cardiac Enzymes: No results for input(s): CKTOTAL, CKMB, CKMBINDEX, TROPONINI in the last 168 hours. BNP (last 3 results) No results for input(s): PROBNP in the last 8760 hours. HbA1C: No results for input(s): HGBA1C in the last 72 hours. CBG: No results for input(s): GLUCAP in the last 168 hours. Lipid Profile: No results for input(s): CHOL, HDL, LDLCALC, TRIG, CHOLHDL, LDLDIRECT in the last 72 hours. Thyroid Function  Tests: No results for input(s): TSH, T4TOTAL, FREET4, T3FREE, THYROIDAB in the last 72 hours. Anemia Panel: No results for input(s): VITAMINB12, FOLATE, FERRITIN, TIBC, IRON, RETICCTPCT in the last 72 hours. Urine analysis:    Component Value Date/Time   COLORURINE AMBER* 09/06/2015 0714   APPEARANCEUR CLEAR 09/06/2015 0714   LABSPEC 1.017 09/06/2015 0714   PHURINE 6.0 09/06/2015 0714   GLUCOSEU NEGATIVE 09/06/2015 0714   HGBUR TRACE* 09/06/2015 0714   BILIRUBINUR SMALL* 09/06/2015 0714   KETONESUR 15* 09/06/2015 0714   PROTEINUR NEGATIVE 09/06/2015 0714   UROBILINOGEN 0.2 04/11/2015 0800   NITRITE POSITIVE* 09/06/2015 0714   LEUKOCYTESUR SMALL* 09/06/2015 0714   Sepsis Labs: @LABRCNTIP (procalcitonin:4,lacticidven:4)  ) Recent Results (from the past 240 hour(s))  Culture, body fluid-bottle     Status: None (Preliminary result)   Collection Time: 09/17/15  2:36 PM  Result Value Ref Range Status   Specimen Description FLUID PERITONEAL  Final   Special Requests BOTTLES DRAWN AEROBIC AND ANAEROBIC 10CC  Final   Culture   Final    NO GROWTH 2 DAYS [facility]    Report Status PENDING  Incomplete  Gram stain     Status: None   Collection Time: 09/17/15  2:36 PM  Result Value Ref Range Status   Specimen Description FLUID PERITONEAL  Final   Special Requests NONE  Final   Gram Stain   Final    CYTOSPIN SMEAR WBC PRESENT, PREDOMINANTLY MONONUCLEAR NO ORGANISMS SEEN [facility]    Report Status 09/18/2015 FINAL  Final         Radiology Studies: No results found.      Scheduled Meds: . calcium carbonate  1 tablet Oral TID AC & HS  . diclofenac sodium  4 g Topical QID  . feeding supplement (ENSURE ENLIVE)  237 mL Oral TID BM  . lipase/protease/amylase  24,000 Units Oral TID AC  . pantoprazole  40 mg Oral BID AC  . thiamine  100 mg Oral Daily   Continuous Infusions:    LOS: 3 days    Time spent in minutes: 45    Lisbet Busker, MD [facility] Pager: [email or web address] Password [email or web address] 09/20/2015, 12:44 PM

## 2015-09-20 NOTE — Progress Notes (Signed)
Initial Nutrition Assessment  DOCUMENTATION CODES:   Severe malnutrition in context of chronic illness  INTERVENTION:  -Discontinue Prostat per pt request.  -Provide Ensure Enlive TID. Each supplement provides 350 kcals and 20 grams of protein.  -Educational handout "GERD Nutrition Therapy" and "Nutrition Therapy for Cirrhosis" from the Academy of Nutrition and Dietetics.  -Continue to monitor for nutritional needs.    NUTRITION DIAGNOSIS:   Inadequate oral intake related to nausea, vomiting as evidenced by per patient/family report.  GOAL:   Patient will meet greater than or equal to 90% of their needs  MONITOR:   PO intake, Supplement acceptance, Labs, Weight trends, Skin, I & O's  REASON FOR ASSESSMENT:   Consult Assessment of nutrition requirement/status  ASSESSMENT:   55 y.o. male with medical history significant of EtOH cirrhosis, COPD, homeless. Patient presents to the ED with chief complaint of homelessness. [place], he was noted to be hypotensive by EMS and on arrival to the ED. Though apparently asymptomatic from this other than his chronic abdominal and back pain.  Pt seen for consult for nutritional assessment. Pt BMI categorized as overweight. Suspect high BMI d/t ascites. Pt severely malnourished. Pt experienced a 24% weight loss in past month which is significant for time frame. Pt weighed 185 lbs on 3/20 and currently weighs 140 lbs. Pt has been eating 75-100% of meals.   Pt reports eating once a day for the past 2-3 months. Pt was having difficulty swallowing and chewing. Pt has poor detention and reports eating soft foods because chewing is difficult. Pt reports less difficulty swallowing now at the hospital. Pt is attempting to eat slower which helps. Pt denies poor appetite. Pt reports N/V once in a while associated with difficulty swallowing. Pt reports abdominal pain. Pt diet recall reveals eating jello,  pudding, and apple sauce because these foods are soft. Pt eating better now. Pt does not want prostat but is amenable to Ensure. Will order Ensure to supplement meals.   Intern discussed the importance of eating small, frequent meals to help minimize reflux. Pt instructed to avoid fatty foods and carbonated beverages. Intern provided pt with handouts "Nutrition Therapy for Cirrhosis" and "GERD Nutrition Therapy" from the Academy of Nutrition [employer] Dietetics.  NFPE: Severe muscle depletion, severe fat depletion, moderate edema.   Labs reviewed; Na 130 mmol/L, BUN 22 mg/dl, Ca 8.1 mg/dl, mag 1.6 mg/dl, glucose 401115 mg/dl.  Meds reviewed; Tums 200 mg QID, KCl 40 mEq, B1 100 mg.  Diet Order:  Diet regular Room service appropriate?: Yes; Fluid consistency:: Thin  Skin:  Reviewed, no issues  Last BM:  4/24  Height:   Ht Readings from Last 1 Encounters:  09/16/15 5\' 6"  (1.676 m)    Weight:   Wt Readings from Last 1 Encounters:  09/18/15 140 lb 1.6 oz (63.549 kg)    Ideal Body Weight:  64.5 kg  BMI:  Body mass index is 22.62 kg/(m^2).  Estimated Nutritional Needs:   Kcal:  1750-1950 (28-30 kcals/kg)  Protein:  85-95 g (1.5 g/kg)  Fluid:  1.7-1.9 L  EDUCATION NEEDS:   No education needs identified at this time  Beryle QuantMeredith Voyd Groft, MS [facility] Dietetic Intern Pager [phone]

## 2015-09-21 LAB — BASIC METABOLIC PANEL
ANION GAP: 6 (ref 5–15)
BUN: 31 mg/dL — AB (ref 6–20)
CHLORIDE: 101 mmol/L (ref 101–111)
CO2: 18 mmol/L — ABNORMAL LOW (ref 22–32)
Calcium: 8 mg/dL — ABNORMAL LOW (ref 8.9–10.3)
Creatinine, Ser: 0.71 mg/dL (ref 0.61–1.24)
GFR calc non Af Amer: >60 mL/min (ref >60)
GLUCOSE: 108 mg/dL — AB (ref 65–99)
POTASSIUM: 5 mmol/L (ref 3.5–5.1)
SODIUM: 125 mmol/L — AB (ref 135–145)

## 2015-09-21 MED ORDER — ZOLPIDEM TARTRATE 5 MG PO TABS
5.0000 mg | ORAL_TABLET | Freq: Every evening | ORAL | Status: DC | PRN
  Administered 2015-09-21: 5 mg via ORAL
  Filled 2015-09-21: qty 1

## 2015-09-21 MED ORDER — PANTOPRAZOLE SODIUM 40 MG PO TBEC
40.0000 mg | DELAYED_RELEASE_TABLET | Freq: Two times a day (BID) | ORAL | Status: DC
  Administered 2015-09-21: 40 mg via ORAL
  Filled 2015-09-21: qty 1

## 2015-09-21 MED ORDER — DIPHENOXYLATE-ATROPINE 2.5-0.025 MG PO TABS
1.0000 | ORAL_TABLET | Freq: Four times a day (QID) | ORAL | Status: DC
  Administered 2015-09-21 – 2015-09-22 (×5): 1 via ORAL
  Filled 2015-09-21 (×5): qty 1

## 2015-09-21 NOTE — Progress Notes (Signed)
The patient is receiving Protonix by the intravenous route.  Based on criteria approved by the Pharmacy and Therapeutics Committee and the Medical Executive Committee, the medication is being converted to the equivalent oral dose form.  These criteria include: -No Active GI bleeding -Able to tolerate diet of full liquids (or better) or tube feeding -Able to tolerate other medications by the oral or enteral route  If you have any questions about this conversion, please contact the Pharmacy Department (phone [phone]).  Thank you.  Otho BellowsGreen, Maitri Schnoebelen L PharmD Pager [phone] 09/21/2015, 1:52 PM

## 2015-09-21 NOTE — Clinical Social Work Note (Signed)
Clinical Social Work Assessment  Patient Details  Name: [name] MRN: [id number] Date of Birth: 1961-05-11  Date of referral:  09/21/15               Reason for consult:  Housing Concerns/Homelessness                Permission sought to share information with:    Permission granted to share information::     Name::        Agency::     Relationship::     Contact Information:     Housing/Transportation Living arrangements for the past 2 months:  Homeless Source of Information:  Patient Patient Interpreter Needed:  None Criminal Activity/Legal Involvement Pertinent to Current Situation/Hospitalization:  No - Comment as needed Significant Relationships:  Siblings Lives with:  Other (Comment) (Homeless) Do you feel safe going back to the place where you live?  Yes Need for family participation in patient care:  No (Coment)  Care giving concerns:  CSW consulted for homelessness.    Social Worker assessment / plan:  CSW recieved consult for homelessness. BSW Intern met with pt at bedside. BSW Intern introduced self and explained role. Pt confirms he is homeless and lives on the street with his brother who is disabled. BSW Intern provided pt with homeless shelter, [place], and food resources. Pt inquired about Alcoholics Anonymous meetings and contact information. BSW Intern provided pt with outpatient substance abuse facilities, list of AA meetings in [place], and [place] contact information.   Pt seems determined and hopeful in his treatment and plans. Pt thanked Engineer, water for the resources and help.   No further SW needs identified at this time.   BSW Intern signing off.   Employment status:  Unemployed Forensic scientist:  Self Pay (Medicaid Pending) PT Recommendations:  No Follow Up Information / Referral to community resources:  Shelter, Support Groups, Outpatient Substance Abuse Treatment Options  Patient/Family's Response to care:  Pt alert and oriented x4. Pt pleasant in  conversation and thanked Engineer, water for help.  Patient/Family's Understanding of and Emotional Response to Diagnosis, Current Treatment, and Prognosis:  Pt reports no further questions at this time.   Emotional Assessment Appearance:  Appears older than stated age Attitude/Demeanor/Rapport:  Other (Appropriate) Affect (typically observed):  Hopeful, Pleasant Orientation:  Oriented to Self, Oriented to Place, Oriented to  Time, Oriented to Situation Alcohol / Substance use:  Not Applicable Psych involvement (Current and /or in the community):  No (Comment)  Discharge Needs  Concerns to be addressed:  No discharge needs identified Readmission within the last 30 days:  Yes Current discharge risk:  None Barriers to Discharge:  No Barriers Identified   Harlon Flor, Student-SW 09/21/2015, 4:31 PM

## 2015-09-21 NOTE — Progress Notes (Signed)
PROGRESS NOTE    [name]  ZOX:[id number] DOB: [birth year] DOA: 09/16/2015  PCP: [name], MD   Brief Narrative:   54y/o with alcoholic cirrhosis, smoker, noncompliance, numerous paracentesis for ascites. He has been off his medications for 3 wks and homeless for 2 wks. He was staying with his brother in a Motel prior to being homeless. He was admitted due to extensive ascites and AKI.   Assessment & Plan:   Principal Problem:   AKI  - hyponatremia- pedal edema - AKI was prerenal (homeless for 2 wks, barely eating) and due to U retention - baseline Cr < 1- Cr on admission 2.24 - has been off his diuretics for 3 wks as he ran out and had no money for follow up - U sodium < 10 and FeNa low - suggests prerenal cause - Cr normalized with IVF and placement of foley cath 4/21- had close to 1.5 L of urine as soon as foley was placed - give voiding trial today - serum sodium 130 - maintaining fluid balance is very difficult in him-  stopped IVF and albumin infusions 4/22 - as he is homeless, has limited access to water and presented with dehydration therefore, not sure what dose to resume diuretics at- social work is trying to get [facility] to [facility]  Active Problems: Severe ascites/ pedal edema - s/p 8 L paracentesis on 4/21- albumin given- started on empiric Rocephin for possible SBP - ascitic fluid negative for SBP- stop Rocephin   - stopped Lasix and Aldactone 3 wks ago- carefully resumed Lasix 20 BID 4/24- will for for 1-2 days to make sure distension is improving and Cr is remaining stable prior to d/c - he refuses TEDS for his pedal edema as they 'cut of my circulation'.     Alcoholic cirrhosis of liver with ascites, elevated LFTs, thrombocytopenia and mild coagulopathy - PT 16.3, plt 55 - stopped drinking a few wks ago - is supposed to be on Lactulose as outpt but as he has not had encephalopathy without the Lactulose for the past 3 wks, he does not need it at this time - has  declined to speak with palliative care in the past- he tells me he knows he will die soon  Hyperkalemia - due to K replacement- EKG OK- should improve with Lasix and his ongoing diarrhea   Acute bronchitis/ smoker - Cough with brown sputum x 3 days- started Doxycycline - no wheezing, hypoxia or dyspnea noted- brown sputum resolved- cough now persists due to GERD- stopped Doxycycline 4/24- no recurrence of brown sputum yet- most of his cough is due to reflex now - smokes about 4-5 cig / day - advised to stop  GERD -severe heartburn and reflux  - was drinking Coke and planned to order a cheese burger for lunch - discussed dietary changes needed to control GERD- he is doing well now with adhering to diet - cont Protonix BID temporarily and TUMS  Diarrhea - due to Protonix? Pancreatitic insufficieny? (chronic alcohol abuse)- per RN some of the food looks undigested- tried pancreatic enzymes but this did not help- trying Lomotil now- he is eating a great deal as he has not eaten much in 2 wks  DVT prophylaxis: SCDS Code Status: Full code Family Communication:  Disposition Plan: homeless   Consultants:   IR  Procedures:   none  Antimicrobials:  Anti-infectives    Start     Dose/Rate Route Frequency Ordered Stop   09/17/15 1000  doxycycline (  VIBRA-TABS) tablet 100 mg  Status:  Discontinued     100 mg Oral Every 12 hours 09/17/15 0822 09/20/15 1106   09/16/15 2230  cefTRIAXone (ROCEPHIN) 2 g in dextrose 5 % 50 mL IVPB  Status:  Discontinued     2 g 100 mL/hr over 30 Minutes Intravenous Every 24 hours 09/16/15 2222 09/17/15 2225      Subjective: 3 more loose stools this AM. Reflux is still bad. Adhering to diet drinking caffeine free coffee and adhering to fluid restriction.   Objective: Filed Vitals:   09/20/15 1818 09/20/15 2206 09/21/15 0442 09/21/15 1155  BP:  99/67 93/59 107/61  Pulse:  101 65 92  Temp:  98.4 F (36.9 C) 98.4 F (36.9 C) 97.7 F (36.5 C)  TempSrc:   Oral Oral Oral  Resp:  18 18 16   Height:      Weight: 73.301 kg (161 lb 9.6 oz) 73.437 kg (161 lb 14.4 oz)    SpO2:  100% 95% 100%    Intake/Output Summary (Last 24 hours) at 09/21/15 1322 Last data filed at 09/21/15 1007  Gross per 24 hour  Intake   1493 ml  Output   2102 ml  Net   -609 ml   Filed Weights   09/18/15 0300 09/20/15 1818 09/20/15 2206  Weight: 63.549 kg (140 lb 1.6 oz) 73.301 kg (161 lb 9.6 oz) 73.437 kg (161 lb 14.4 oz)    Examination: General exam: Appears comfortable  Respiratory system: Clear to auscultation. Respiratory effort normal. Cardiovascular system: S1 & S2 heard, RRR. No JVD, murmurs, rubs, gallops or clicks. No pedal edema. Gastrointestinal system: Abdomen is moderately distended, non tender BS + Central nervous system: Alert and oriented. No focal neurological deficits. Extremities: Symmetric 5 x 5 power. 2+ pedal edema Skin: No rashes, lesions or ulcers Psychiatry: Judgement and insight appear normal. Mood & affect appropriate.     Data Reviewed: I have personally reviewed following labs and imaging studies  CBC:  Recent Labs Lab 09/16/15 1719 09/17/15 0423 09/18/15 0435 09/19/15 0440 09/20/15 0841  WBC 13.4* 10.9* 7.6 9.9 10.7*  NEUTROABS 10.2*  --   --   --   --   HGB 12.6* 12.0* 11.7* 11.9* 12.3*  HCT 33.7* 32.6* 32.0* 33.7* 35.3*  MCV 92.3 93.4 95.2 97.4 99.2  PLT 104* 55* 42* 69* 90*   Basic Metabolic Panel:  Recent Labs Lab 09/17/15 1529 09/18/15 0435 09/19/15 0440 09/20/15 0841 09/21/15 0503  NA 124* 124* 130* 128* 125*  K 3.3* 3.0* 4.4 6.1* 5.0  CL 96* 97* 103 104 101  CO2 18* 18* 21* 21* 18*  GLUCOSE 130* 105* 115* 105* 108*  BUN 47* 38* 22* 23* 31*  CREATININE 1.52* 1.18 0.88 0.73 0.71  CALCIUM 7.8* 7.7* 8.1* 8.3* 8.0*  MG  --  1.6* 1.6*  --   --    GFR: Estimated Creatinine Clearance: 95.3 mL/min (by C-G formula based on Cr of 0.71). Liver Function Tests:  Recent Labs Lab 09/16/15 1719 09/17/15 1529  09/18/15 0435  AST 70* 56* 41  ALT 40 36 28  ALKPHOS 79 79 63  BILITOT 1.5* 1.0 0.8  PROT 5.2* 5.2* 4.9*  ALBUMIN 2.1* 2.0* 2.2*   No results for input(s): LIPASE, AMYLASE in the last 168 hours. No results for input(s): AMMONIA in the last 168 hours. Coagulation Profile:  Recent Labs Lab 09/16/15 1719  INR 1.36   Cardiac Enzymes: No results for input(s): CKTOTAL, CKMB, CKMBINDEX, TROPONINI in the  last 168 hours. BNP (last 3 results) No results for input(s): PROBNP in the last 8760 hours. HbA1C: No results for input(s): HGBA1C in the last 72 hours. CBG: No results for input(s): GLUCAP in the last 168 hours. Lipid Profile: No results for input(s): CHOL, HDL, LDLCALC, TRIG, CHOLHDL, LDLDIRECT in the last 72 hours. Thyroid Function Tests: No results for input(s): TSH, T4TOTAL, FREET4, T3FREE, THYROIDAB in the last 72 hours. Anemia Panel: No results for input(s): VITAMINB12, FOLATE, FERRITIN, TIBC, IRON, RETICCTPCT in the last 72 hours. Urine analysis:    Component Value Date/Time   COLORURINE AMBER* 09/06/2015 0714   APPEARANCEUR CLEAR 09/06/2015 0714   LABSPEC 1.017 09/06/2015 0714   PHURINE 6.0 09/06/2015 0714   GLUCOSEU NEGATIVE 09/06/2015 0714   HGBUR TRACE* 09/06/2015 0714   BILIRUBINUR SMALL* 09/06/2015 0714   KETONESUR 15* 09/06/2015 0714   PROTEINUR NEGATIVE 09/06/2015 0714   UROBILINOGEN 0.2 04/11/2015 0800   NITRITE POSITIVE* 09/06/2015 0714   LEUKOCYTESUR SMALL* 09/06/2015 0714   Sepsis Labs: (procalcitonin:4,lacticidven:4)  ) Recent Results (from the past 240 hour(s))  Culture, body fluid-bottle     Status: None (Preliminary result)   Collection Time: 09/17/15  2:36 PM  Result Value Ref Range Status   Specimen Description FLUID PERITONEAL  Final   Special Requests BOTTLES DRAWN AEROBIC AND ANAEROBIC 10CC  Final   Culture   Final    NO GROWTH 4 DAYS Performed at [facility]    Report Status PENDING  Incomplete  Gram stain      Status: None   Collection Time: 09/17/15  2:36 PM  Result Value Ref Range Status   Specimen Description FLUID PERITONEAL  Final   Special Requests NONE  Final   Gram Stain   Final    CYTOSPIN SMEAR WBC PRESENT, PREDOMINANTLY MONONUCLEAR NO ORGANISMS SEEN Performed at [facility]    Report Status 09/18/2015 FINAL  Final         Radiology Studies: No results found.      Scheduled Meds: . calcium carbonate  1 tablet Oral TID AC & HS  . diclofenac sodium  4 g Topical QID  . diphenoxylate-atropine  1 tablet Oral QID  . feeding supplement (ENSURE ENLIVE)  237 mL Oral TID BM  . furosemide  20 mg Intravenous Q12H  . pantoprazole (PROTONIX) IV  40 mg Intravenous Q12H  . thiamine  100 mg Oral Daily   Continuous Infusions:    LOS: 4 days    Time spent in minutes: 45    Bryce Demuro, MD [facility] Pager: [email or web address] Password [email or web address] 09/21/2015, 1:22 PM

## 2015-09-22 ENCOUNTER — Ambulatory Visit (HOSPITAL_BASED_OUTPATIENT_CLINIC_OR_DEPARTMENT_OTHER): Payer: Self-pay | Admitting: Clinical

## 2015-09-22 ENCOUNTER — Encounter: Payer: Self-pay | Admitting: Family Medicine

## 2015-09-22 ENCOUNTER — Ambulatory Visit: Payer: Medicaid Other | Attending: Family Medicine | Admitting: Family Medicine

## 2015-09-22 VITALS — BP 96/66 | HR 105 | Temp 98.1°F | Resp 12 | Ht 73.0 in | Wt 172.0 lb

## 2015-09-22 DIAGNOSIS — Z590 Homelessness: Secondary | ICD-10-CM

## 2015-09-22 DIAGNOSIS — Z515 Encounter for palliative care: Secondary | ICD-10-CM | POA: Insufficient documentation

## 2015-09-22 DIAGNOSIS — F438 Other reactions to severe stress: Secondary | ICD-10-CM

## 2015-09-22 DIAGNOSIS — E46 Unspecified protein-calorie malnutrition: Secondary | ICD-10-CM | POA: Insufficient documentation

## 2015-09-22 DIAGNOSIS — K7031 Alcoholic cirrhosis of liver with ascites: Secondary | ICD-10-CM | POA: Insufficient documentation

## 2015-09-22 DIAGNOSIS — Z6822 Body mass index (BMI) 22.0-22.9, adult: Secondary | ICD-10-CM | POA: Insufficient documentation

## 2015-09-22 DIAGNOSIS — Z79899 Other long term (current) drug therapy: Secondary | ICD-10-CM | POA: Insufficient documentation

## 2015-09-22 LAB — CBC WITH DIFFERENTIAL/PLATELET
BASOS ABS: 0.1 10*3/uL (ref 0.0–0.1)
Basophils Relative: 1 %
EOS ABS: 0.3 10*3/uL (ref 0.0–0.7)
Eosinophils Relative: 3 %
HCT: 32 % — ABNORMAL LOW (ref 39.0–52.0)
HEMOGLOBIN: 11 g/dL — AB (ref 13.0–17.0)
LYMPHS ABS: 1.8 10*3/uL (ref 0.7–4.0)
Lymphocytes Relative: 21 %
MCH: 34 pg (ref 26.0–34.0)
MCHC: 34.4 g/dL (ref 30.0–36.0)
MCV: 98.8 fL (ref 78.0–100.0)
Monocytes Absolute: 1 10*3/uL (ref 0.1–1.0)
Monocytes Relative: 12 %
NEUTROS PCT: 63 %
Neutro Abs: 5.3 10*3/uL (ref 1.7–7.7)
PLATELETS: 100 10*3/uL — AB (ref 150–400)
RBC: 3.24 MIL/uL — AB (ref 4.22–5.81)
RDW: 15.4 % (ref 11.5–15.5)
WBC: 8.4 10*3/uL (ref 4.0–10.5)

## 2015-09-22 LAB — BASIC METABOLIC PANEL
Anion gap: 4 — ABNORMAL LOW (ref 5–15)
BUN: 33 mg/dL — ABNORMAL HIGH (ref 6–20)
CHLORIDE: 100 mmol/L — AB (ref 101–111)
CO2: 23 mmol/L (ref 22–32)
CREATININE: 0.91 mg/dL (ref 0.61–1.24)
Calcium: 8.2 mg/dL — ABNORMAL LOW (ref 8.9–10.3)
Glucose, Bld: 104 mg/dL — ABNORMAL HIGH (ref 65–99)
POTASSIUM: 4.3 mmol/L (ref 3.5–5.1)
SODIUM: 127 mmol/L — AB (ref 135–145)

## 2015-09-22 LAB — CULTURE, BODY FLUID-BOTTLE

## 2015-09-22 LAB — CULTURE, BODY FLUID W GRAM STAIN -BOTTLE: Culture: NO GROWTH

## 2015-09-22 MED ORDER — DIPHENOXYLATE-ATROPINE 2.5-0.025 MG PO TABS: 1.0000 | ORAL_TABLET | Freq: Four times a day (QID) | ORAL | Status: DC

## 2015-09-22 MED ORDER — FUROSEMIDE 20 MG PO TABS: 20.0000 mg | ORAL_TABLET | Freq: Every day | ORAL | Status: DC

## 2015-09-22 MED ORDER — SPIRONOLACTONE 50 MG PO TABS: 50.0000 mg | ORAL_TABLET | Freq: Every day | ORAL | Status: DC

## 2015-09-22 MED FILL — !VENTOLIN HFA INHALER: 108 (90 BAS | 30 days supply | Qty: 18 | Fill #1

## 2015-09-22 MED FILL — ?SPIRONOLACTONE 50 MG TABLE: 50 | 30 days supply | Qty: 30 | Fill #0

## 2015-09-22 NOTE — Discharge Summary (Signed)
Physician Discharge Summary  [name] RUE:[id number] DOB: 18-Apr-1961 DOA: 09/16/2015  PCP: [name], MD  Admit date: 09/16/2015 Discharge date: 09/22/2015  Time spent:minutes  Recommendations for Outpatient Follow-up:  1.  Patient will need close monitoring of renal function, LFTs as an outpatient probably within a week with labs if he is able to make it 2. Patient has been discharged home on Lasix 20 mg once daily, Aldactone 25 once daily purposely not using the 100-40 ratio as there was a real risk of volume depletion in a setting of cirrhosis-I've encouraged him to stay on 1200 cc fluid restriction and get labs as above 3. Patient will need to follow-up with primary care physician to coordinate with them at [facility] for therapeutic paracentesis-it appears he has had 8-10 appointments for this as an outpatient 4. I would recommend primary care physician to further delineate goals of care-patient was seen by me only for one day on one occasion but he does understand that "the end is near" yet he has no further advance planning for this 5. He is at high risk for readmission 6. Patient is homeless and social workers to coordinate with the patient with regards to discharge to [facility] or [facility]-he has been clean from alcohol per his report for 3 months prior to this admission  Discharge Diagnoses:  Principal Problem:   AKI (acute kidney injury) (HCC) Active Problems:   Alcoholic cirrhosis of liver with ascites (HCC)   Hyponatremia with excess extracellular fluid volume   Hypokalemia   Chronic liver disease and cirrhosis (HCC)   Protein-calorie malnutrition, severe   Discharge Condition:  Fair  Diet recommendation:  Heart healthy 1200 fluid restricted extremely low salt diet  Filed Weights   09/20/15 2206 09/21/15 1155 09/22/15 0508  Weight: 73.437 kg (161 lb 14.4 oz) 73.437 kg (161 lb 14.4 oz) 74.39 kg (164 lb)    History of present  illness:  55y/o with alcoholic cirrhosis, smoker, noncompliance, numerous paracentesis for ascites. He has been off his medications for 3 wks and homeless for 2 wks. He was staying with his brother in a Motel prior to being homeless. He was admitted due to extensive ascites and AKI.   Hospital Course:  AKI - hyponatremia- pedal edema - AKI was prerenal (homeless for 2 wks, barely eating) and due to U retention - baseline Cr < 1- Cr on admission 2.24 - has been off his diuretics for 3 wks as he ran out and had no money for follow up -he was given scripts for both Aldactone as well as Lasix on discharge at the above doses and will need close follow-up and lab work - Cr normalized with IVF and placement of foley cath 4/21- had close to 1.5 L of urine as soon as foley was placed -Foley d/c prior to d/c home - serum sodium 115-130 on d/c - maintaining fluid balance is very difficult in him- stopped IVF and albumin infusions 4/22 - as he is homeless, has limited access to water and presented with dehydration therefore, not sure what dose to resume diuretics at- social [facility] : Severe ascites/ pedal edema - s/p 8 L paracentesis on 4/21- albumin given- started on empiric Rocephin for possible SBP - ascitic fluid negative for SBP- stop Rocephin  - stopped Lasix and Aldactone 3 wks ago-  - resumed Lasix 20 mg daily as well as Aldactone 25 mg on discharge-needs lab  work in 1 week - he refuses TEDS for his pedal edema as they 'cut of my circulation'.    Alcoholic cirrhosis of liver with ascites, elevated LFTs, thrombocytopenia and mild coagulopathy - PT 16.3 and he tells me on discharge that he quit drinking 3 months prior., plt 55-->90 on d/c - stopped drinking a few wks ago-- - is supposed to be on Lactulose as outpt but as he has not had encephalopathy without the Lactulose for the past 3 wks, he does not need it at this time - has declined to speak with  palliative care in the past- he tells me he knows he will die soon -He will need further advance planning discussions as an outpatient. I had a detailed discussion with him on discharge regarding this   Hyperkalemia - due to K replacement- EKG OK- should improve with Lasix and his ongoing diarrhea   Acute bronchitis/ smoker - Cough with brown sputum x 3 days- started Doxycycline - no wheezing, hypoxia or dyspnea noted- brown sputum resolved- cough now persists due to GERD- stopped Doxycycline 4/24- no recurrence of brown sputum yet- most of his cough is due to reflex now - smokes about 4-5 cig / day - advised to stop  GERD -severe heartburn and reflux - was drinking Coke and planned to order a cheese burger for lunch - discussed dietary changes needed to control GERD -patient will use Tums as an outpatient versus Protonix.    Diarrhea - due to Protonix? Pancreatitic insufficieny? (chronic alcohol abuse)- per RN some of the food looks undigested- tried pancreatic enzymes but this did not help - trying Lomotil now - he is eating a great deal as he has not eaten much in 2 wks -scirpts for Lomotil given  Consultations:  None/IR para 4/21  Discharge Exam: Filed Vitals:   09/22/15 0533 09/22/15 0629  BP: 70/40 90/54  Pulse:    Temp:    Resp:      General: alert emaciate din nad Cardiovascular: s1 s2 no m/r/g  Respiratory: clear no added sound  Discharge Instructions   Discharge Instructions    Diet - low sodium heart healthy    Complete by:  As directed      Increase activity slowly    Complete by:  As directed           Current Discharge Medication List    CONTINUE these medications which have CHANGED   Details  furosemide (LASIX) 20 MG tablet Take 1 tablet (20 mg total) by mouth daily. Qty: 30 tablet, Refills: 0    spironolactone (ALDACTONE) 50 MG tablet Take 1 tablet (50 mg total) by mouth daily. Qty: 30 tablet, Refills: 2   Associated Diagnoses: Alcoholic  cirrhosis of liver with ascites (HCC)      CONTINUE these medications which have NOT CHANGED   Details  albuterol (PROVENTIL HFA;VENTOLIN HFA) 108 (90 Base) MCG/ACT inhaler Inhale 2 puffs into the lungs every 6 (six) hours as needed for wheezing or shortness of breath. Qty: 54 g, Refills: 3    lactulose (CHRONULAC) 10 GM/15ML solution Take 15 mLs (10 g total) by mouth 2 (two) times daily. Qty: 946 mL, Refills: 2   Associated Diagnoses: Alcoholic cirrhosis of liver with ascites (HCC)    pantoprazole (PROTONIX) 40 MG tablet Take 1 tablet (40 mg total) by mouth daily. Qty: 30 tablet, Refills: 2   Associated Diagnoses: Alcoholic cirrhosis of liver with ascites (HCC)    potassium chloride SA (K-DUR,KLOR-CON) 20 MEQ tablet  Take 1 tablet (20 mEq total) by mouth daily. Qty: 30 tablet, Refills: 2    thiamine 100 MG tablet Take 1 tablet (100 mg total) by mouth daily. Qty: 30 tablet, Refills: 0       No Known Allergies Follow-up Information    Follow up with [facility] On 09/28/2015.   Why:  Hospital follow-up appointment on 09/28/15 at 3:00 pm with Dr. Venetia NightAmao.   Contact information:   [address] [place] [id number] [phone]       The results of significant diagnostics from this hospitalization (including imaging, microbiology, ancillary and laboratory) are listed below for reference.    Significant Diagnostic Studies: Dg Chest 2 View  09/05/2015  CLINICAL DATA:  Nausea, vomiting, diarrhea for 3 days EXAM: CHEST  2 VIEW COMPARISON:  06/07/2015 FINDINGS: There is left lower lobe airspace disease. There is no pleural effusion or pneumothorax. The heart and mediastinal contours are unremarkable. The osseous structures are unremarkable. IMPRESSION: Left lower lobe pneumonia. Followup PA and lateral chest X-ray is recommended in 3-4 weeks following trial of antibiotic therapy to ensure resolution and exclude underlying malignancy.  Electronically Signed   By: Elige KoHetal  Patel   On: 09/05/2015 20:31   Dg Lumbar Spine Complete  09/10/2015  CLINICAL DATA:  Lower back pain for 1 week. Nontraumatic. Lower extremity weakness for 2 or 3 days. EXAM: LUMBAR SPINE - COMPLETE 4+ VIEW COMPARISON:  None. FINDINGS: There is mild right convex curvature centered at L3. The lumbar vertebrae are normal in height. Good preservation of intervertebral disc spaces. No bone lesion or bony destruction. No fracture or other acute bony abnormality. IMPRESSION: Mild curvature.  No fracture or other acute bone abnormality Electronically Signed   By: Ellery Plunkaniel R [name] M.D.   On: 09/10/2015 22:09   [place] Paracentesis  09/17/2015  INDICATION: Cirrhosis, recurrent ascites. Request made for diagnostic and therapeutic paracentesis. EXAM: ULTRASOUND GUIDED DIAGNOSTIC AND THERAPEUTIC  PARACENTESIS MEDICATIONS: None. COMPLICATIONS: None immediate. PROCEDURE: Informed written consent was obtained from the patient after a discussion of the risks, benefits and alternatives to treatment. A timeout was performed prior to the initiation of the procedure. Initial ultrasound scanning demonstrates a large amount of ascites within the right lower abdominal quadrant. The right lower abdomen was prepped and draped in the usual sterile fashion. 1% lidocaine was used for local anesthesia. Following this, a Yueh catheter was introduced. An ultrasound image was saved for documentation purposes. The paracentesis was performed. The catheter was removed and a dressing was applied. The patient tolerated the procedure well without immediate post procedural complication. FINDINGS: A total of approximately 8.8 liters of light yellow fluid was removed. Samples were sent to the laboratory as requested by the clinical team. IMPRESSION: Successful ultrasound-guided diagnostic and therapeutic paracentesis yielding 8.8 liters of peritoneal fluid. Read by: Jeananne RamaKevin Allred, PA-C Electronically Signed   By: Irish LackGlenn   Yamagata M.D.   On: 09/17/2015 15:22   [place] Paracentesis  09/09/2015  INDICATION: Abdominal distention secondary to recurrent ascites. Request therapeutic paracentesis. EXAM: ULTRASOUND GUIDED RIGHT LOWER QUADRANT PARACENTESIS MEDICATIONS: None. COMPLICATIONS: None immediate. PROCEDURE: Informed written consent was obtained from the patient after a discussion of the risks, benefits and alternatives to treatment. A timeout was performed prior to the initiation of the procedure. Initial ultrasound scanning demonstrates a large amount of ascites within the right lower abdominal quadrant. The right lower abdomen was prepped and draped in the usual sterile fashion. 1% lidocaine with epinephrine was used  for local anesthesia. Following this, a Safe-T-Centesis catheter was introduced. An ultrasound image was saved for documentation purposes. The paracentesis was performed. The catheter was removed and a dressing was applied. The patient tolerated the procedure well without immediate post procedural complication. FINDINGS: A total of approximately 7.2 L of clear yellow fluid was removed. IMPRESSION: Successful ultrasound-guided paracentesis yielding 7.2 liters of peritoneal fluid. Read by: Brayton El PA-C Electronically Signed   By: Irish Lack M.D.   On: 09/09/2015 16:12   [place] Paracentesis  09/06/2015  INDICATION: ascites EXAM: ULTRASOUND-GUIDED PARACENTESIS COMPARISON:  Previous para MEDICATIONS: 10 cc 1% lidocaine COMPLICATIONS: None immediate. TECHNIQUE: Informed written consent was obtained from the patient after a discussion of the risks, benefits and alternatives to treatment. A timeout was performed prior to the initiation of the procedure. Initial ultrasound scanning demonstrates a large amount of ascites within the right lower abdominal quadrant. The right lower abdomen was prepped and draped in the usual sterile fashion. 1% lidocaine with epinephrine was used for local anesthesia. Under direct  ultrasound guidance, a 19 gauge, 7-cm, Yueh catheter was introduced. An ultrasound image was saved for documentation purposed. The paracentesis was performed. The catheter was removed and a dressing was applied. The patient tolerated the procedure well without immediate post procedural complication. FINDINGS: A total of approximately 5.2 liters of yellow fluid was removed. Samples were sent to the laboratory as requested by the clinical team. IMPRESSION: Successful ultrasound-guided paracentesis yielding 5.2 liters of peritoneal fluid. Maximum withdrawn per MD Read by:  Robet Leu [facility] Electronically Signed   By: Judie Petit.  Shick M.D.   On: 09/06/2015 11:32   US Paracentesis  08/23/2015  INDICATION: Cirrhosis with recurrent ascites. Presents today for a therapeutic paracentesis. EXAM: ULTRASOUND GUIDED THERAPEUTIC PARACENTESIS MEDICATIONS: 1% lidocaine COMPLICATIONS: None immediate. PROCEDURE: Informed written consent was obtained from the patient after a discussion of the risks, benefits and alternatives to treatment. A timeout was performed prior to the initiation of the procedure. Initial ultrasound scanning demonstrates a large amount of ascites within the right lower abdominal quadrant. The right lower abdomen was prepped and draped in the usual sterile fashion. 1% lidocaine was used for local anesthesia. Following this, a 19 gauge, 7-cm, Yueh catheter was introduced. An ultrasound image was saved for documentation purposes. The paracentesis was performed. The catheter was removed and a dressing was applied. The patient tolerated the procedure well without immediate post procedural complication. FINDINGS: A total of approximately 7 L of yellow fluid was removed. IMPRESSION: Successful ultrasound-guided paracentesis yielding 7 liters of peritoneal fluid. The patient went to short-stay following his procedure to receive 25 g of albumin. Read by: Barnetta Chapel, PA-C Electronically Signed   By: Irish Lack M.D.    On: 08/23/2015 13:11    Microbiology: Recent Results (from the past 240 hour(s))  Culture, body fluid-bottle     Status: None (Preliminary result)   Collection Time: 09/17/15  2:36 PM  Result Value Ref Range Status   Specimen Description FLUID PERITONEAL  Final   Special Requests BOTTLES DRAWN AEROBIC AND ANAEROBIC 10CC  Final   Culture   Final    NO GROWTH 4 DAYS Performed at [facility]    Report Status PENDING  Incomplete  Gram stain     Status: None   Collection Time: 09/17/15  2:36 PM  Result Value Ref Range Status   Specimen Description FLUID PERITONEAL  Final   Special Requests NONE  Final   Gram Stain  Final    CYTOSPIN SMEAR WBC PRESENT, PREDOMINANTLY MONONUCLEAR NO ORGANISMS SEEN Performed at [facility]    Report Status 09/18/2015 FINAL  Final     Labs: Basic Metabolic Panel:  Recent Labs Lab 09/18/15 0435 09/19/15 0440 09/20/15 0841 09/21/15 0503 09/22/15 0356  NA 124* 130* 128* 125* 127*  K 3.0* 4.4 6.1* 5.0 4.3  CL 97* 103 104 101 100*  CO2 18* 21* 21* 18* 23  GLUCOSE 105* 115* 105* 108* 104*  BUN 38* 22* 23* 31* 33*  CREATININE 1.18 0.88 0.73 0.71 0.91  CALCIUM 7.7* 8.1* 8.3* 8.0* 8.2*  MG 1.6* 1.6*  --   --   --    Liver Function Tests:  Recent Labs Lab 09/16/15 1719 09/17/15 1529 09/18/15 0435  AST 70* 56* 41  ALT 40 36 28  ALKPHOS 79 79 63  BILITOT 1.5* 1.0 0.8  PROT 5.2* 5.2* 4.9*  ALBUMIN 2.1* 2.0* 2.2*   No results for input(s): LIPASE, AMYLASE in the last 168 hours. No results for input(s): AMMONIA in the last 168 hours. CBC:  Recent Labs Lab 09/16/15 1719 09/17/15 0423 09/18/15 0435 09/19/15 0440 09/20/15 0841  WBC 13.4* 10.9* 7.6 9.9 10.7*  NEUTROABS 10.2*  --   --   --   --   HGB 12.6* 12.0* 11.7* 11.9* 12.3*  HCT 33.7* 32.6* 32.0* 33.7* 35.3*  MCV 92.3 93.4 95.2 97.4 99.2  PLT 104* 55* 42* 69* 90*   Cardiac Enzymes: No results for input(s): CKTOTAL, CKMB, CKMBINDEX, TROPONINI in the last 168  hours. BNP: BNP (last 3 results) No results for input(s): BNP in the last 8760 hours.  ProBNP (last 3 results) No results for input(s): PROBNP in the last 8760 hours.  CBG: No results for input(s): GLUCAP in the last 168 hours.     SignedRhetta Mura MD   [facility] 09/22/2015, 9:46 AM

## 2015-09-22 NOTE — Progress Notes (Signed)
[facility]   [facility] is providing the following services: Rec'd order for rw.  Patient rec'd a wheelchair 02/2015 and is not eligible for a rw at this time.   If patient discharges after hours, please call [phone].   Renard HamperLecretia Williamson 09/22/2015, 11:10 AM

## 2015-09-22 NOTE — Progress Notes (Signed)
Subjective:  Patient ID: [name], male    DOB: 1961/01/31  Age: 55 y.o. MRN: [id number]  CC: Flank Pain   HPI [name] is a 55 year old male with a history of decompensated liver cirrhosis with massive ascites who comes into the clinic for a follow-up visit [facility].  He was hospitalized from 09/16/15 through today for prerenal acute kidney injury secondary to poor oral intake and urinary retention (which was treated with a foley) and decompensated alcoholic liver cirrhosis due to running out of medications for 3 weeks prior to presentation.  He received Rocephin for presumptive spontaneous bacterial peritonitis patient was also on doxycycline for acute bronchitis. Creatinine trended down from 2.24 and admission to 0.91 admission; he did have therapeutic paracentesis the last of which was on 4/21 with removal of 8 L.  He walked into the clinic after discharge from the hospital because he needed to have his outpatient therapeutic paracentesis set up. Complains of shortness of breath, abdominal distention, pedal edema and would like to have paracentesis as soon as possible.  He still lives and woods with his brother.  Outpatient Prescriptions Prior to Visit  Medication Sig Dispense Refill  . albuterol (PROVENTIL HFA;VENTOLIN HFA) 108 (90 Base) MCG/ACT inhaler Inhale 2 puffs into the lungs every 6 (six) hours as needed for wheezing or shortness of breath. 54 g 3  . diphenoxylate-atropine (LOMOTIL) 2.5-0.025 MG tablet Take 1 tablet by mouth 4 (four) times daily. 30 tablet 0  . furosemide (LASIX) 20 MG tablet Take 1 tablet (20 mg total) by mouth daily. 30 tablet 0  . lactulose (CHRONULAC) 10 GM/15ML solution Take 15 mLs (10 g total) by mouth 2 (two) times daily. 946 mL 2  . potassium chloride SA (K-DUR,KLOR-CON) 20 MEQ tablet Take 1 tablet (20 mEq total) by mouth daily. 30 tablet 2  . spironolactone (ALDACTONE) 50 MG tablet Take 1 tablet (50 mg total) by mouth  daily. 30 tablet 2  . thiamine 100 MG tablet Take 1 tablet (100 mg total) by mouth daily. 30 tablet 0  . albuterol (PROVENTIL) (2.5 MG/3ML) 0.083% nebulizer solution 2.5 mg     . calcium carbonate (TUMS - dosed in mg elemental calcium) chewable tablet 200 mg of elemental calcium     . calcium carbonate (TUMS - dosed in mg elemental calcium) chewable tablet 200 mg of elemental calcium     . diclofenac sodium (VOLTAREN) 1 % transdermal gel 4 g     . diphenoxylate-atropine (LOMOTIL) 2.5-0.025 MG per tablet 1 tablet     . feeding supplement (ENSURE ENLIVE) (ENSURE ENLIVE) liquid 237 mL     . furosemide (LASIX) injection 20 mg     . pantoprazole (PROTONIX) EC tablet 40 mg     . thiamine (VITAMIN B-1) tablet 100 mg     . zolpidem (AMBIEN) tablet 5 mg      No facility-administered medications prior to visit.    ROS Review of Systems Constitutional: Negative for fever, chills, diaphoresis, activity change, appetite change and fatigue. HENT: Negative for ear pain, nosebleeds, congestion, facial swelling, rhinorrhea, neck pain, neck stiffness and ear discharge.  Eyes: Negative for pain, discharge, redness, itching and visual disturbance. Respiratory: Negative for cough, choking, chest tightness, positive for shortness of breath, negative for wheezing and stridor.  Cardiovascular: Negative for chest pain, palpitations and positive for leg swelling. Gastrointestinal: Positive for abdominal distention. Genitourinary: Negative for dysuria, urgency, frequency, hematuria, flank pain, decreased urine volume, difficulty urinating and  dyspareunia.  Musculoskeletal: Negative for back pain Hematologic: Positive for bruising Neurological: Negative for dizziness, tremors, seizures, syncope, facial asymmetry, speech difficulty, weakness, light-headedness, numbness and headaches.   Objective:  BP 96/66 mmHg  Pulse 105  Temp(Src) 98.1 F (36.7 C) (Oral)  Resp 12  Ht 6\' 1"  (1.854 m)  Wt 172 lb (78.019 kg)   BMI 22.70 kg/m2  SpO2 100%  BP/Weight 09/22/2015 09/22/2015 09/10/2015  Systolic BP 96 90 110  Diastolic BP 66 54 88  Wt. (Lbs) 172 164 -  BMI 22.7 26.48 -      Physical Exam Constitutional: Patient appears, chronically ill looking, cachectic CVS: Tachycardic rate, S1/S2 +, no murmurs, no gallops, no carotid bruit.  Pulmonary: No evidence of respiratory distress, no stridor, rhonchi, wheezes, rales.  Abdominal: Massive ascites Musculoskeletal: significant pedal edema Lymphadenopathy: No lymphadenopathy noted, cervical, inguinal or axillary Neuro: Alert. Normal reflexes, muscle tone coordination. No cranial nerve deficit. Skin: Skin is dry and scaly, multiple bruises noted. Psychiatric: Normal mood and affect. Behavior, judgment, thought content normal   CMP Latest Ref Rng 09/22/2015 09/21/2015 09/20/2015  Glucose 65 - 99 mg/dL 161(W104(H) 960(A108(H) 540(J105(H)  BUN 6 - 20 mg/dL 81(X33(H) 91(Y31(H) 78(G23(H)  Creatinine 0.61 - 1.24 mg/dL 9.560.91 2.130.71 0.860.73  Sodium 135 - 145 mmol/L 127(L) 125(L) 128(L)  Potassium 3.5 - 5.1 mmol/L 4.3 5.0 6.1(HH)  Chloride 101 - 111 mmol/L 100(L) 101 104  CO2 22 - 32 mmol/L 23 18(L) 21(L)  Calcium 8.9 - 10.3 mg/dL 8.2(L) 8.0(L) 8.3(L)  Total Protein 6.5 - 8.1 g/dL - - -  Total Bilirubin 0.3 - 1.2 mg/dL - - -  Alkaline Phos 38 - 126 U/L - - -  AST 15 - 41 U/L - - -  ALT 17 - 63 U/L - - -      Assessment & Plan:   1. Alcoholic cirrhosis of liver with ascites (HCC) End-stage Refilled medications for patient and I have educated him about transportation services provided by the clinic for his appointment and also the assistance available in the pharmacy site. Labs at next office visit - US Paracentesis; Future - US Paracentesis; Future - US Paracentesis; Future - US Paracentesis; Future - US Paracentesis; Future  2. Palliative care encounter I have discussed goals of care with him and he is agreeable to a hospice referral. He states his brother is his healthcare power  of attorney but we have no documentation; his brother also has a hearing disability and has multiple sclerosis and they both live in the woods. [facility] called to speak with the patient; we will attempt to get [facility] or some other place  3. Protein-calorie malnutrition (HCC) Patient states he has food at this time. I have informed him of the resources available in the clinic and in the community as well.   No orders of the defined types were placed in this encounter.    Follow-up: Return in about 6 days (around 09/28/2015), or if symptoms worsen or fail to improve, for follow up of cirrhosis and ascites.   [name] MD

## 2015-09-22 NOTE — Progress Notes (Signed)
ASSESSMENT: Pt currently experiencing both Homelessness and Adjustment reaction to chronic stress. Pt needs to f/u with PCP; would benefit from brief therapeutic interventions and community resources regarding coping with homelessness and chronic stress.  Stage of Change: precontemplative  PLAN: 1. F/U with behavioral health consultant in as needed 2. Psychiatric Medications: none. 3. Behavioral recommendation(s):   -Take food bag to eat today -Answer brothers phone [phone]) when [facility] calls:  [facility] will arrange transportation to f/u appointment with Dr. Venetia NightAmao on 09-28-15, and will need to know where to pick up that day.  -Answer phone when hospice/palliative care calls (to arrange in-person assessment for bed placement) -Consider Advanced Directive at next visit (or with hospice/palliative care)  SUBJECTIVE: Pt. referred by Dr Venetia NightAmao for :  Pt. reports the following symptoms/concerns: Pt states that his primary concern today is that he and his deaf brother are living homeless/outside, that he needs to eat, and that he needs transportation to get back to his "home". Pt is open to palliative care, says that he is using his brothers phone as his main communication. Since last release from ED, pt motivated to get into any inside shelter, and also open to getting help with putting together Advanced Directive at his next visit. Pt feeling some anxiety over the unknown.  Duration of problem: Less than one week Severity: moderate  OBJECTIVE: Orientation & Cognition: Oriented x3. Thought processes normal and appropriate to situation. Mood: appropriate (but slightly lower mood than previous visits). Affect: appropriate Appearance: appropriate Risk of harm to self or others: no known risk of harm to self or others Substance use: alcohol, tobacco Assessments administered: PHQ2: 0  Diagnosis: Homelessness/ Adjustment reaction to chronic stress CPT Code: Z59.0/  F43.8 -------------------------------------------- Other(s) present in the room: none  Time spent with patient in exam room: 30 minutes, 3-3:30pm   [facility] 07/22/2015 06/14/2015 05/28/2015 05/07/2015 04/28/2015  Decreased Interest 3 3 3 3 3   Down, Depressed, Hopeless 3 3 3 3 3   PHQ - 2 Score 6 6 6 6 6   Altered sleeping 3 3 3  - 3  Tired, decreased energy 3 3 3  - 3  Change in appetite 3 3 3  - 3  Feeling bad or failure about yourself  - 3 3 - 3  Trouble concentrating 3 3 3  - 3  Moving slowly or fidgety/restless 3 3 0 - 0  Suicidal thoughts 3 0 0 - 0  PHQ-9 Score 24 24 21  - 21    GAD 7 : Generalized Anxiety Score 07/22/2015 06/14/2015 05/28/2015 04/28/2015  Nervous, Anxious, on Edge 3 3 3 3   Control/stop worrying 3 3 3 3   Worry too much - different things 3 3 3 3   Trouble relaxing 3 3 3 3   Restless 3 3 3 3   Easily annoyed or irritable 3 3 3 3   Afraid - awful might happen 3 3 3 3   Total GAD 7 Score 21 21 21 21   Anxiety Difficulty - - Very difficult Very difficult

## 2015-09-22 NOTE — Progress Notes (Signed)
Patient's here for f/up AKI and cirrohsis.  Patient requesting appts for US paracentesis.

## 2015-09-22 NOTE — Progress Notes (Signed)
Discharge instructions reviewed with patient, questions answered, verbalized understanding.  Patient given 3 bus passes that were provided by SW.  Patient states he plans [facility] so he can get his medications.  Patient ambulatory to bus stop accompanied by director of unit.

## 2015-09-22 NOTE — Progress Notes (Signed)
Spoke with patient at bedside, [facility], he plans to go there to get his meds. He states he has a rolling walker already. There are 3 bus passes for him to get where he needs to go.

## 2015-09-24 ENCOUNTER — Encounter: Payer: Self-pay | Admitting: Clinical

## 2015-09-24 ENCOUNTER — Ambulatory Visit (HOSPITAL_COMMUNITY)
Admission: RE | Admit: 2015-09-24 | Discharge: 2015-09-24 | Disposition: A | Discharge status: Home / Self Care | Payer: Medicaid Other | Source: Ambulatory Visit | Attending: Family Medicine | Admitting: Family Medicine

## 2015-09-24 ENCOUNTER — Telehealth: Payer: Self-pay

## 2015-09-24 ENCOUNTER — Ambulatory Visit (HOSPITAL_COMMUNITY): Payer: MEDICAID

## 2015-09-24 DIAGNOSIS — K7031 Alcoholic cirrhosis of liver with ascites: Secondary | ICD-10-CM | POA: Insufficient documentation

## 2015-09-24 MED ORDER — ALBUMIN HUMAN 25 % IV SOLN
25.0000 g | Freq: Once | INTRAVENOUS | Status: AC
  Administered 2015-09-24: 25 g via INTRAVENOUS
  Filled 2015-09-24: qty 100

## 2015-09-24 MED ORDER — LIDOCAINE HCL (PF) 1 % IJ SOLN
INTRAMUSCULAR | Status: AC
  Filled 2015-09-24: qty 10

## 2015-09-24 NOTE — Telephone Encounter (Signed)
This Case Manager placed call to patient to discuss transportation for Paracentesis on 09/28/15 at 1000 and appointment with Dr. Venetia NightAma[facility] on 09/28/15 at [facility]. Call placed to #[phone]; unable to reach patient. HIPPA compliant voicemail left requesting return call.

## 2015-09-24 NOTE — Progress Notes (Signed)
[facility] and indicated he will need transportation to his upcoming appointments (09/28/15 at 1000 for Paracentesis and 09/28/15 at 1500 with Dr. Venetia NightAmao). Patient indicated he can be called on 09/27/15 so it can be determined where the cab can pick him up on 09/28/15. Patient confirmed [phone] is the best phone number to reach him.

## 2015-09-24 NOTE — Progress Notes (Signed)
Referral to hospice has been faxed to hospice referral center at [phone]; hospice may be contacted at [phone].   Mr. [name] will need transportation to his next [facility] appointment on 09-28-15 at 3pm; also [facility].   [place] with his [place]. Mr. [name] is using his brothers phone, at [phone].

## 2015-09-25 DIAGNOSIS — Z5900 Homelessness unspecified: Secondary | ICD-10-CM

## 2015-09-25 DIAGNOSIS — K7031 Alcoholic cirrhosis of liver with ascites: Secondary | ICD-10-CM

## 2015-09-25 DIAGNOSIS — J441 Chronic obstructive pulmonary disease with (acute) exacerbation: Secondary | ICD-10-CM

## 2015-09-27 ENCOUNTER — Telehealth: Payer: Self-pay

## 2015-09-27 NOTE — Telephone Encounter (Signed)
[facility] # [phone] to check on status of the hospice referral and inquire of the hospice nurse is meeting the patient at [facility].  This CM spoke to [facility] who stated that the patient was admitted to hospice today by their admission nurse, Joni ReiningNicole. The patient is [facility] # [phone] room # 324. Adelia noted that the patient's primary nurse, Kenney Housemananya has yet to schedule an appointment with the patient and Delorise Royalsdelia was not aware of a nurse meeting the patient at [facility] tomorrow. Adelia said that he will be staying at that residence for about a month.   Call then placed to the patient at # [phone] room 324 and he confirmed that he will be [facility] and for the paracentesis at 1000. He said that he does not need cab transportation as he was given bus passes when he left the hospital. He said that he planned to take the bus. This CM inquired if he knew where he was to go for each appointment and he stated that he did. Provided him with the phone # [facility] and instructed him to call the clinic if his transportation plans change and he needs a cab.  He stated that he would.  This CM also instructed him to bring all of his medications with him to his appointment tomorrow and he said that he would. He noted that he only has 2 medications and will discuss what else he needs to get with the doctor tomorrow.   He noted that his brother is staying with him at the motel and they are provided meals. He said that the best # to reach him is the motel # and then try his brother's cell # [phone] he is not reached at the motel/  He was very appreciative of the call.

## 2015-09-27 NOTE — Telephone Encounter (Signed)
Attempted to contact the patient to determine where the cab can pick him up for his appointments tomorrow 09/28/15 at 1000 for a paracentesis and at 1500 with [facility]. Call placed to # [phone] and a HIPAA compliant voice mail message was left requesting a call back to # [phone] or [phone].

## 2015-09-28 ENCOUNTER — Ambulatory Visit: Payer: Medicaid Other | Attending: Family Medicine | Admitting: Family Medicine

## 2015-09-28 ENCOUNTER — Ambulatory Visit (HOSPITAL_COMMUNITY)
Admission: RE | Admit: 2015-09-28 | Discharge: 2015-09-28 | Disposition: A | Discharge status: Home / Self Care | Payer: Medicaid Other | Source: Ambulatory Visit | Attending: Family Medicine | Admitting: Family Medicine

## 2015-09-28 ENCOUNTER — Encounter: Payer: Self-pay | Admitting: Family Medicine

## 2015-09-28 ENCOUNTER — Inpatient Hospital Stay: Payer: Self-pay | Admitting: Family Medicine

## 2015-09-28 VITALS — BP 111/78 | HR 92 | Temp 98.4°F | Resp 16 | Ht 73.0 in | Wt 160.4 lb

## 2015-09-28 DIAGNOSIS — F32A Depression, unspecified: Secondary | ICD-10-CM | POA: Insufficient documentation

## 2015-09-28 DIAGNOSIS — Z515 Encounter for palliative care: Secondary | ICD-10-CM | POA: Insufficient documentation

## 2015-09-28 DIAGNOSIS — J449 Chronic obstructive pulmonary disease, unspecified: Secondary | ICD-10-CM | POA: Insufficient documentation

## 2015-09-28 DIAGNOSIS — K7031 Alcoholic cirrhosis of liver with ascites: Secondary | ICD-10-CM | POA: Insufficient documentation

## 2015-09-28 DIAGNOSIS — E46 Unspecified protein-calorie malnutrition: Secondary | ICD-10-CM

## 2015-09-28 DIAGNOSIS — F329 Major depressive disorder, single episode, unspecified: Secondary | ICD-10-CM | POA: Insufficient documentation

## 2015-09-28 DIAGNOSIS — Z79899 Other long term (current) drug therapy: Secondary | ICD-10-CM | POA: Insufficient documentation

## 2015-09-28 MED ORDER — CITALOPRAM HYDROBROMIDE 20 MG PO TABS: 20.0000 mg | ORAL_TABLET | Freq: Every day | ORAL | Status: DC

## 2015-09-28 MED ORDER — DICLOFENAC SODIUM 1 % TD GEL: 4.0000 g | Freq: Four times a day (QID) | TRANSDERMAL | Status: DC

## 2015-09-28 MED ORDER — LIDOCAINE HCL (PF) 1 % IJ SOLN
INTRAMUSCULAR | Status: AC
  Filled 2015-09-28: qty 10

## 2015-09-28 MED ORDER — DICLOFENAC SODIUM 75 MG PO TBEC: 75.0000 mg | DELAYED_RELEASE_TABLET | Freq: Two times a day (BID) | ORAL | Status: DC

## 2015-09-28 MED ORDER — ALBUMIN HUMAN 25 % IV SOLN
25.0000 g | Freq: Once | INTRAVENOUS | Status: AC
  Administered 2015-09-28: 25 g via INTRAVENOUS
  Filled 2015-09-28: qty 100

## 2015-09-28 MED FILL — ?CITALOPRAM HBR 20 MG TABLE: 20 | 30 days supply | Qty: 30 | Fill #0

## 2015-09-28 MED FILL — VOLTAREN 1% GEL: 1 | 6 days supply | Qty: 100 | Fill #0

## 2015-09-28 MED FILL — LACTULOSE 10 GM/15 ML SOLN: 10 | 32 days supply | Qty: 946 | Fill #1

## 2015-09-28 MED FILL — POTASSIUM CL ER 20 MEQ TAB: 20 | 30 days supply | Qty: 30 | Fill #1

## 2015-09-28 NOTE — Progress Notes (Signed)
Subjective:    Patient ID: [name], male    DOB: 1961/01/24, 55 y.o.   MRN: [id number]  HPI He is a 55 year old male with a history of decompensated liver cirrhosis with massive ascites, recent hospitalization for prerenal acute kidney injury secondary to urinary retention and poor oral intake, worsening of ascites secondary to liver cirrhosis who comes into the clinic for a follow-up visit  Since his last office visit he has had two therapeutic abdominal paracentesis sessions with removal of 6.9 L today and 7 L on 4/28. He continues to complain of significant pedal edema and has been compliant with all his medications. He also complains of severe pain in his lower back, legs and chest wall; his dyspnea has improved after his paracentesis.  He is depressed due to his medical conditions and snaps at people easily. He has been seen by hospice of the [place] and currently has a place to stay his brother.  Past Medical History  Diagnosis Date  . ETOH abuse   . Cirrhosis of liver (HCC)   . COPD (chronic obstructive pulmonary disease) [facility])     Past Surgical History  Procedure Laterality Date  . No past surgeries      jaw surgery 68yrs ago    Current Outpatient Prescriptions on File Prior to Visit  Medication Sig Dispense Refill  . albuterol (PROVENTIL HFA;VENTOLIN HFA) 108 (90 Base) MCG/ACT inhaler Inhale 2 puffs into the lungs every 6 (six) hours as needed for wheezing or shortness of breath. 54 g 3  . furosemide (LASIX) 20 MG tablet Take 1 tablet (20 mg total) by mouth daily. 30 tablet 0  . lactulose (CHRONULAC) 10 GM/15ML solution Take 15 mLs (10 g total) by mouth 2 (two) times daily. 946 mL 2  . potassium chloride SA (K-DUR,KLOR-CON) 20 MEQ tablet Take 1 tablet (20 mEq total) by mouth daily. 30 tablet 2  . spironolactone (ALDACTONE) 50 MG tablet Take 1 tablet (50 mg total) by mouth daily. 30 tablet 2  . diphenoxylate-atropine (LOMOTIL) 2.5-0.025 MG tablet Take 1 tablet by  mouth 4 (four) times daily. (Patient not taking: Reported on 09/26/2015) 30 tablet 0  . thiamine 100 MG tablet Take 1 tablet (100 mg total) by mouth daily. (Patient not taking: Reported on 09/26/2015) 30 tablet 0   Current Facility-Administered Medications on File Prior to Visit  Medication Dose Route Frequency Provider Last Rate Last Dose  . lidocaine (PF) (XYLOCAINE) 1 % injection              Review of Systems Constitutional: Negative for fever, chills, diaphoresis, activity change, appetite change and fatigue. HENT: Negative for ear pain, nosebleeds, congestion, facial swelling, rhinorrhea, neck pain, neck stiffness and ear discharge.  Eyes: Negative for pain, discharge, redness, itching and visual disturbance. Respiratory: Negative for cough, choking, chest tightness, positive for shortness of breath, negative for wheezing and stridor.  Cardiovascular: Negative for chest pain, palpitations and positive for leg swelling. Gastrointestinal: Positive for abdominal distention. Genitourinary: Negative for dysuria, urgency, frequency, hematuria, flank pain, decreased urine volume, difficulty urinating and dyspareunia.  Musculoskeletal: Positive for back pain Hematologic: Positive for bruising Neurological: Negative for dizziness, tremors, seizures, syncope, facial asymmetry, speech difficulty, weakness, light-headedness, numbness and headaches. Psych: positive for Depression      Objective: Filed Vitals:   09/28/15 1406  BP: 111/78  Pulse: 92  Temp: 98.4 F (36.9 C)  TempSrc: Oral  Resp: 16  Height:  (1.854 m)  Weight: 160 lb 6.4 oz (  72.757 kg)  SpO2: 98%      Physical Exam Constitutional: Patient appears, chronically ill looking, cachectic CVS: Tachycardic rate, S1/S2 +, no murmurs, no gallops, no carotid bruit.  Pulmonary: No evidence of respiratory distress, no stridor, rhonchi, wheezes, rales.  Abdominal: Massive ascites Musculoskeletal: significant pedal  edema Lymphadenopathy: No lymphadenopathy noted, cervical, inguinal or axillary Neuro: Alert. Normal reflexes, muscle tone coordination. No cranial nerve deficit. Skin: Skin is dry and scaly, multiple bruises noted. Psychiatric: Normal mood and affect. Behavior, judgment, thought content normal         Assessment & Plan:  1. 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 Palliative care encounter [facility]  3. Protein-calorie malnutrition (HCC) Secondary to underlying medical condition above Patient states he has food at this time.  4. Homelessness No longer living in the woods He currently has a place to stay for the next 30 days  5. Depression High pH Q9 score of 22 and GAD 7 score of 21 Secondary to medical condition and the fact that he knows his condition is end-stage Will benefit from counseling sessions with hospice Commenced on Celexa

## 2015-09-28 NOTE — Procedures (Signed)
   US guided RLQ paracentesis  Tolerated well 6.9 Liters- yellow fluid  Post procedure 25 gr IV albumin per MD

## 2015-09-28 NOTE — Progress Notes (Signed)
Patient's here for 6 day f/up cirrhosis and ascites.  Patient reports feeling better since paracentesis.  Patient is currently using diclofenac sodium topical gel 1% for pain.

## 2015-09-29 DIAGNOSIS — Z5900 Homelessness unspecified: Secondary | ICD-10-CM

## 2015-09-29 DIAGNOSIS — R188 Other ascites: Secondary | ICD-10-CM

## 2015-09-29 DIAGNOSIS — K7031 Alcoholic cirrhosis of liver with ascites: Secondary | ICD-10-CM

## 2015-10-04 ENCOUNTER — Telehealth: Payer: Self-pay | Admitting: Family Medicine

## 2015-10-04 NOTE — Congregational Nurse Program (Signed)
Congregational Nurse Program Note  Date of Encounter: 09/25/2015  Past Medical History: Past Medical History  Diagnosis Date  . ETOH abuse   . Cirrhosis of liver (HCC)   . COPD (chronic obstructive pulmonary disease) (HCC)     Encounter Details:     CNP Questionnaire - 09/25/15 1518    Patient Demographics   Is this a new or existing patient? New   Patient is considered a/an Not Applicable   Race Caucasian/White   Patient Assistance   Location of Patient Assistance [facility] patient   Patient's financial/insurance status Low Income   Uninsured Patient Yes   Interventions Appt. has been completed   Patient referred to apply for the following financial assistance Medicaid;[place] Card/Care Connects   Food insecurities addressed Provided food gift voucher   Transportation assistance Yes   Type of Assistance Bus Pass Given   Assistance securing medications No  [facility]    Educational health offerings Acute disease;Behavioral health;Chronic disease;Medications;Navigating the healthcare system;Nutrition   Encounter Details   Primary purpose of visit Chronic Illness/Condition Visit;Education/Health Concerns;Navigating the Healthcare System;Post ED/Hospitalization Visit;Safety   Was an Emergency Department visit averted? Not Applicable   Does patient have a medical provider? Yes   Patient referred to Follow up with established PCP;Area Agency   Was a mental health screening completed? (GAINS tool) No  Pt. has history of anxiety and depression and states that years ago was admitted into a mental institution, reports being on medications at different times over the years, but not currently.  Pt. states he has had suicidal thoughts in the past but hasonl   Does patient have dental issues? Yes   Was a dental referral made? Yes   Does patient have vision issues? No   Does your patient have an abnormal blood pressure today? No   Since previous encounter,  have you referred patient for abnormal blood pressure that resulted in a new diagnosis or medication change? No   Does your patient have an abnormal blood glucose today? No   Since previous encounter, have you referred patient for abnormal blood glucose that resulted in a new diagnosis or medication change? No   Was there a life-saving intervention made? No   For Abstraction Use Only   Was the patient insured? No         Amb Nursing Assessment - 09/28/15 1412    Pre-visit preparation   Pre-visit preparation completed Yes   Pain Assessment   Pain Assessment No/denies pain   Nutrition Screen   Diabetes No   Functional Status   Activities of Daily Living Independent   Ambulation Independent   Medication Administration Independent   Home Management Independent   Risk/Barriers  Assessment   Barriers to Care Management & Learning None   Psychosocial Barriers Uninsured/under-insured   Abuse/Neglect Assessment   Do you feel unsafe in your current relationship? No   Do you feel physically threatened by others? No   Anyone hurting you at home, work, or school? No   Unable to ask? No   Web designerLanguage Assistant   Interpreter Needed? No      BP 98/60 mmHg  Pulse 80  Resp 14  Initial visit made to patient along with Gordy CouncilmanKaren Townsend SW. Pt. In room 324 along with his brother [name].  They had just returned from [place]x. 2 [place] proceeded with her assessment and nurse followed. Review of meds noted that pt. Without  potassium and lactulose.  Advised to drink plenty of fluids in due to walking in the sun and heat, also to eat foods containing potassium (has bananas).  Advised to use walker at all times to avoid falls.  Bruises noted to face, arms and legs which are enlarged bilaterally with 3+ edema.  Feet are swollen as well with very hard and yellow toe nails. Abdomen very enlarged with scattered bowel sounds noted.  Lungs clear bilaterally. C/O of occasional burning with  urination no blood noted, states bowels are loose which is normal for him. Pt. States that he has not had a drink in 6 months. He does continue to smoke 4 cigarettes/day and states he is not ready to quit but will continue to try and cut down. Pt. Reports history of anxiety and depression for which he has had prescription medication for but not currently taking anything for either.  Pt. Also states that in the past he has had suicidal thoughts but never tried to hurt himself other than trying to drink himself to death.  SW spoke more with pt. Regarding his past mental health issues. Pt. Is aware of his MD appt. On 09/28/15 at 10a for paracentesis and later at 3:00pm. With Dr. Venetia Night.  Pt. To pick up additional meds at that appt. And also to let doctor know of burning with urination. Discussed Hospice referral with pt. Who became teary eyed. Nurse talked about difference in palliative care and end of life care.  Pt is glad that he has his brother who is a great source of comfort and support and does his best to look out for him. Hospice will make contact with pt. On Monday morning May 8. Nurse plans to visit again with pt. On May 3. [place] Sarinana D. Azucena Kuba MSN, RN

## 2015-10-04 NOTE — Telephone Encounter (Signed)
Patient has genital edema... Nurse would like to know of other options besides Lasix 20 mg

## 2015-10-06 DIAGNOSIS — J441 Chronic obstructive pulmonary disease with (acute) exacerbation: Secondary | ICD-10-CM

## 2015-10-06 DIAGNOSIS — K7031 Alcoholic cirrhosis of liver with ascites: Secondary | ICD-10-CM

## 2015-10-06 DIAGNOSIS — Z5900 Homelessness unspecified: Secondary | ICD-10-CM

## 2015-10-06 NOTE — Congregational Nurse Program (Signed)
Congregational Nurse Program Note  Date of Encounter: 10/06/2015  Past Medical History: Past Medical History  Diagnosis Date  . ETOH abuse   . Cirrhosis of liver (HCC)   . COPD (chronic obstructive pulmonary disease) (HCC)     Encounter Details:     CNP Questionnaire - 10/06/15 2103    Patient Demographics   Is this a new or existing patient? Existing   Patient is considered a/an Not Applicable   Race Caucasian/White   Patient Assistance   Location of Patient Assistance [facility] patient   Patient's financial/insurance status Low Income   Uninsured Patient Yes   Interventions Appt. has been completed;Follow-up/Education/Support provided after completed appt.   Patient referred to apply for the following financial assistance Medicaid;[place] Card/Care Connects   Food insecurities addressed Provided food gift voucher   Transportation assistance Yes   Type of Assistance Bus Pass Given   Assistance securing medications No  [facility]    Educational health offerings Acute disease;Behavioral health;Chronic disease;Medications;Navigating the healthcare system;Nutrition;Safety   Encounter Details   Primary purpose of visit Chronic Illness/Condition Visit;Education/Health Concerns;Navigating the Healthcare System;Post ED/Hospitalization Visit;Safety   Was an Emergency Department visit averted? Not Applicable   Does patient have a medical provider? Yes   Patient referred to Follow up with established PCP;Area Agency   Was a mental health screening completed? (GAINS tool) No  Pt. has history of anxiety and depression and states that years ago was admitted into a mental institution, reports being on medications at different times over the years, but not currently.  Pt. states he has had suicidal thoughts in the past but hasonl   Does patient have dental issues? No   Was a dental referral made? No   Does patient have vision issues? No   Does your patient  have an abnormal blood pressure today? No   Since previous encounter, have you referred patient for abnormal blood pressure that resulted in a new diagnosis or medication change? No   Does your patient have an abnormal blood glucose today? No   Since previous encounter, have you referred patient for abnormal blood glucose that resulted in a new diagnosis or medication change? No   Was there a life-saving intervention made? No   For Abstraction Use Only   Was the patient insured? No         Amb Nursing Assessment - 09/28/15 1412    Pre-visit preparation   Pre-visit preparation completed Yes   Pain Assessment   Pain Assessment No/denies pain   Nutrition Screen   Diabetes No   Functional Status   Activities of Daily Living Independent   Ambulation Independent   Medication Administration Independent   Home Management Independent   Risk/Barriers  Assessment   Barriers to Care Management & Learning None   Psychosocial Barriers Uninsured/under-insured   Abuse/Neglect Assessment   Do you feel unsafe in your current relationship? No   Do you feel physically threatened by others? No   Anyone hurting you at home, work, or school? No   Unable to ask? No   Web designerLanguage Assistant   Interpreter Needed? No      BP 98/70 mmHg  Pulse 80  Resp 14Visit to pt. Today with SW.  Client sitting up in chair and his brother [name] is present.  States he's doing ok.  Legs appear to be less swollen than last week and toe  Nails have been clipped by Scott.  States he  keeps his legs elevated on pillows while lying in bed at least 3x/day..  Reports no burning or stinging with urination, having 2-3 soft BM's/day, abdomen enlarged with scattered bowel sounds, appetite not good but did eat two sandwiches today. Lungs are clear bilaterally states he has more difficulty breathing first thing in the morning, uses inhaler twice a day, has a dry cough in the morning.  Has appt. For paracentesis and to see hid primary  doctor on May 12.  Hospice visited pt. On Monday.  Pt. Has received his food stamp card and is in the process of completing a disability application at the [facility]  Now has an ID card from the [employer].

## 2015-10-07 NOTE — Telephone Encounter (Signed)
Elevate legs and scrotum

## 2015-10-08 ENCOUNTER — Ambulatory Visit: Payer: Medicaid Other | Attending: Family Medicine | Admitting: Family Medicine

## 2015-10-08 ENCOUNTER — Ambulatory Visit (HOSPITAL_COMMUNITY)
Admission: RE | Admit: 2015-10-08 | Discharge: 2015-10-08 | Disposition: A | Discharge status: Home / Self Care | Payer: Medicaid Other | Source: Ambulatory Visit | Attending: Family Medicine | Admitting: Family Medicine

## 2015-10-08 ENCOUNTER — Encounter: Payer: Self-pay | Admitting: Family Medicine

## 2015-10-08 VITALS — BP 101/67 | HR 88 | Temp 98.2°F | Resp 16 | Ht 73.0 in | Wt 164.8 lb

## 2015-10-08 DIAGNOSIS — Z79899 Other long term (current) drug therapy: Secondary | ICD-10-CM | POA: Insufficient documentation

## 2015-10-08 DIAGNOSIS — F329 Major depressive disorder, single episode, unspecified: Secondary | ICD-10-CM | POA: Diagnosis not present

## 2015-10-08 DIAGNOSIS — F32A Depression, unspecified: Secondary | ICD-10-CM

## 2015-10-08 DIAGNOSIS — F17210 Nicotine dependence, cigarettes, uncomplicated: Secondary | ICD-10-CM | POA: Insufficient documentation

## 2015-10-08 DIAGNOSIS — K7031 Alcoholic cirrhosis of liver with ascites: Secondary | ICD-10-CM | POA: Insufficient documentation

## 2015-10-08 LAB — COMPLETE METABOLIC PANEL WITH GFR
ALBUMIN: 2.5 g/dL — AB (ref 3.6–5.1)
ALT: 13 U/L (ref 9–46)
AST: 23 U/L (ref 10–35)
Alkaline Phosphatase: 72 U/L (ref 40–115)
BILIRUBIN TOTAL: 0.6 mg/dL (ref 0.2–1.2)
BUN: 19 mg/dL (ref 7–25)
CALCIUM: 7.7 mg/dL — AB (ref 8.6–10.3)
CO2: 20 mmol/L (ref 20–31)
Chloride: 107 mmol/L (ref 98–110)
Creat: 0.9 mg/dL (ref 0.70–1.33)
Glucose, Bld: 115 mg/dL — ABNORMAL HIGH (ref 65–99)
POTASSIUM: 3.9 mmol/L (ref 3.5–5.3)
Sodium: 136 mmol/L (ref 135–146)
Total Protein: 5.3 g/dL — ABNORMAL LOW (ref 6.1–8.1)

## 2015-10-08 LAB — PROTIME-INR
INR: 1.17 (ref <1.50)
Prothrombin Time: 15 seconds (ref 11.6–15.2)

## 2015-10-08 MED ORDER — ALBUMIN HUMAN 25 % IV SOLN
25.0000 g | Freq: Once | INTRAVENOUS | Status: AC
  Administered 2015-10-08: 25 g via INTRAVENOUS
  Filled 2015-10-08: qty 100

## 2015-10-08 MED ORDER — LIDOCAINE HCL (PF) 1 % IJ SOLN
INTRAMUSCULAR | Status: AC
  Filled 2015-10-08: qty 10

## 2015-10-08 NOTE — Progress Notes (Signed)
Subjective:    Patient ID: [name], male    DOB: [birth year], 55 y.o.   MRN: [id number]  HPI He is a 55 year old male with a history of decompensated liver cirrhosis with massive ascites, And is status post 7 L of therapeutic paracentesis today with IV albumin infusion and reports feeling better after paracentesis. He continues to have ascites, pedal and scrotal edema but denies shortness of breath.  He is under the care of hospice at the [facility].  Past Medical History  Diagnosis Date  . ETOH abuse   . Cirrhosis of liver (HCC)   . COPD (chronic obstructive pulmonary disease) [facility])     Past Surgical History  Procedure Laterality Date  . No past surgeries      jaw surgery 40yrs ago    Social History   Social History  . Marital Status: Single    Spouse Name: N/A  . Number of Children: N/A  . Years of Education: N/A   Occupational History  . Not on file.   Social History Main Topics  . Smoking status: Current Every Day Smoker -- 0.50 packs/day for 40 years    Types: Cigarettes  . Smokeless tobacco: Never Used  . Alcohol Use: No     Comment: 05/28/15-states still not drinking  . Drug Use: No  . Sexual Activity: No   Other Topics Concern  . Not on file   Social History Narrative    No Known Allergies  Current Outpatient Prescriptions on File Prior to Visit  Medication Sig Dispense Refill  . albuterol (PROVENTIL HFA;VENTOLIN HFA) 108 (90 Base) MCG/ACT inhaler Inhale 2 puffs into the lungs every 6 (six) hours as needed for wheezing or shortness of breath. 54 g 3  . citalopram (CELEXA) 20 MG tablet Take 1 tablet (20 mg total) by mouth daily. 30 tablet 3  . diclofenac sodium (VOLTAREN) 1 % GEL Apply 4 g topically 4 (four) times daily. 100 g 1  . furosemide (LASIX) 20 MG tablet Take 1 tablet (20 mg total) by mouth daily. 30 tablet 0  . lactulose (CHRONULAC) 10 GM/15ML solution Take 15 mLs (10 g total) by mouth 2 (two) times daily. 946 mL 2  . potassium chloride  SA (K-DUR,KLOR-CON) 20 MEQ tablet Take 1 tablet (20 mEq total) by mouth daily. 30 tablet 2  . spironolactone (ALDACTONE) 50 MG tablet Take 1 tablet (50 mg total) by mouth daily. 30 tablet 2  . thiamine 100 MG tablet Take 1 tablet (100 mg total) by mouth daily. 30 tablet 0  . diphenoxylate-atropine (LOMOTIL) 2.5-0.025 MG tablet Take 1 tablet by mouth 4 (four) times daily. (Patient not taking: Reported on 10/08/2015) 30 tablet 0   Current Facility-Administered Medications on File Prior to Visit  Medication Dose Route Frequency Provider Last Rate Last Dose  . lidocaine (PF) (XYLOCAINE) 1 % injection               Review of Systems Constitutional: Negative for fever, chills, diaphoresis, activity change, appetite change and fatigue. HENT: Negative for ear pain, nosebleeds, congestion, facial swelling, rhinorrhea, neck pain, neck stiffness and ear discharge.  Eyes: Negative for pain, discharge, redness, itching and visual disturbance. Respiratory: Negative for cough, choking, chest tightness, positive for shortness of breath, negative for wheezing and stridor.  Cardiovascular: Negative for chest pain, palpitations and positive for leg swelling. Gastrointestinal: Positive for abdominal distention. Genitourinary: Negative for dysuria, urgency, frequency, hematuria, flank pain, decreased urine volume, difficulty urinating and dyspareunia.  Musculoskeletal: Positive for  back pain Hematologic: Positive for bruising Neurological: Negative for dizziness, tremors, seizures, syncope, facial asymmetry, speech difficulty, weakness, light-headedness, numbness and headaches. Psych: positive for Depression      Objective: Filed Vitals:   10/08/15 1236  BP: 101/67  Pulse: 88  Temp: 98.2 F (36.8 C)  TempSrc: Oral  Resp: 16  Height: 6\' 1"  (1.854 m)  Weight: 164 lb 12.8 oz (74.753 kg)  SpO2: 100%     Physical Exam Constitutional: Patient appears, chronically ill looking, cachectic CVS: Tachycardic  rate, S1/S2 +, no murmurs, no gallops, no carotid bruit.  Pulmonary: No evidence of respiratory distress, no stridor, rhonchi, wheezes, rales.  Abdominal: Massive ascites Musculoskeletal: significant pedal edema Lymphadenopathy: No lymphadenopathy noted, cervical, inguinal or axillary Neuro: Alert. Normal reflexes, muscle tone coordination. No cranial nerve deficit. Skin: Skin is dry and scaly, multiple bruises noted. Psychiatric: Normal mood and affect. Behavior, judgment, thought content normal        Assessment & Plan:  1. 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 Depression Secondary to medical condition and the fact that he knows his condition is end-stage Will benefit from counseling sessions with hospice Continue Celexa

## 2015-10-08 NOTE — Procedures (Signed)
Ultrasound-guided therapeutic paracentesis performed yielding 7 liters of clear yellow colored fluid. No immediate complications.  [name] E 11:09 AM 10/08/2015

## 2015-10-08 NOTE — Progress Notes (Signed)
Patient's here for f/up for liver cirroshis.  Patient denies any pain today.

## 2015-10-08 NOTE — Patient Instructions (Signed)
Cirrhosis °Cirrhosis is long-term (chronic) liver injury. The liver is your largest internal organ, and it performs many functions. The liver converts food into energy, removes toxic material from your blood, makes important proteins, and absorbs necessary vitamins from your diet. °If you have cirrhosis, it means many of your healthy liver cells have been replaced by scar tissue. This prevents blood from flowing through your liver, which makes it difficult for your liver to function. This scarring is not reversible, but treatment can prevent it from getting worse.  °CAUSES  °Hepatitis C and long-term alcohol abuse are the most common causes of cirrhosis. Other causes include: °· Nonalcoholic fatty liver disease. °· Hepatitis B infection. °· Autoimmune hepatitis. °· Diseases that cause blockage of ducts inside the liver. °· Inherited liver diseases. °· Reactions to certain long-term medicines. °· Parasitic infections. °· Long-term exposure to certain toxins. °RISK FACTORS °You may have a higher risk of cirrhosis if you: °· Have certain hepatitis viruses. °· Abuse alcohol, especially if you are male. °· Are overweight. °· Share needles. °· Have unprotected sex with someone who has hepatitis. °SYMPTOMS  °You may not have any signs and symptoms at first. Symptoms may not develop until the damage to your liver starts to get worse. Signs and symptoms of cirrhosis may include:  °· Tenderness in the right-upper part of your abdomen. °· Weakness and tiredness (fatigue). °· Loss of appetite. °· Nausea. °· Weight loss and muscle loss. °· Itchiness. °· Yellow skin and eyes (jaundice). °· Buildup of fluid in the abdomen (ascites). °· Swelling of the feet and ankles (edema). °· Appearance of tiny blood vessels under the skin. °· Mental confusion. °· Easy bruising and bleeding. °DIAGNOSIS  °Your health care provider may suspect cirrhosis based on your symptoms and medical history, especially if you have other medical conditions  or a history of alcohol abuse. Your health care provider will do a physical exam to feel your liver and check for signs of cirrhosis. Your health care provider may perform other tests, including:  °· Blood tests to check:   °¨ Whether you have hepatitis B or C.   °¨ Kidney function. °¨ Liver function. °· Imaging tests such as: °¨ MRI or CT scan to look for changes seen in advanced cirrhosis. °¨ Ultrasound to see if normal liver tissue is being replaced by scar tissue. °· A procedure using a long needle to take a sample of liver tissue (biopsy) for examination under a microscope. Liver biopsy can confirm the diagnosis of cirrhosis.   °TREATMENT  °Treatment depends on how damaged your liver is and what caused the damage. Treatment may include treating cirrhosis symptoms or treating the underlying causes of the condition to try to slow the progression of the damage. Treatment may include: °· Making lifestyle changes, such as:   °¨ Eating a healthy diet. °¨ Restricting salt intake.  °¨ Maintaining a healthy weight.   °¨ Not abusing drugs or alcohol. °· Taking medicines to: °¨ Treat liver infections or other infections. °¨ Control itching. °¨ Reduce fluid buildup. °¨ Reduce certain blood toxins. °¨ Reduce risk of bleeding from enlarged blood vessels in the stomach or esophagus (varices). °· If varices are causing bleeding problems, you may need treatment with a procedure that ties up the vessels causing them to fall off (band ligation). °· If cirrhosis is causing your liver to fail, your health care provider may recommend a liver transplant. °· Other treatments may be recommended depending on any complications of cirrhosis, such as liver-related kidney failure (hepatorenal   syndrome). °HOME CARE INSTRUCTIONS  °· Take medicines only as directed by your health care provider. Do not use drugs that are toxic to your liver. Ask your health care provider before taking any new medicines, including over-the-counter medicines.    °· Rest as needed. °· Eat a well-balanced diet. Ask your health care provider or dietitian for more information.   °· You may have to follow a low-salt diet or restrict your water intake as directed. °· Do not drink alcohol. This is especially important if you are taking acetaminophen. °· Keep all follow-up visits as directed by your health care provider. This is important. °SEEK MEDICAL CARE IF: °· You have fatigue or weakness that is getting worse. °· You develop swelling of the hands, feet, legs, or face. °· You have a fever. °· You develop loss of appetite. °· You have nausea or vomiting. °· You develop jaundice. °· You develop easy bruising or bleeding. °SEEK IMMEDIATE MEDICAL CARE IF: °· You vomit bright red blood or a material that looks like coffee grounds. °· You have blood in your stools. °· Your stools appear black and tarry. °· You become confused. °· You have chest pain or trouble breathing. °  °This information is not intended to replace advice given to you by your health care provider. Make sure you discuss any questions you have with your health care provider. °  °Document Released: 05/15/2005 Document Revised: 06/05/2014 Document Reviewed: 01/21/2014 °Elsevier Interactive Patient Education ©2016 [employer] ° °

## 2015-10-13 DIAGNOSIS — K7031 Alcoholic cirrhosis of liver with ascites: Secondary | ICD-10-CM

## 2015-10-13 DIAGNOSIS — Z5900 Homelessness unspecified: Secondary | ICD-10-CM

## 2015-10-13 DIAGNOSIS — J441 Chronic obstructive pulmonary disease with (acute) exacerbation: Secondary | ICD-10-CM

## 2015-10-14 ENCOUNTER — Telehealth: Payer: Self-pay

## 2015-10-14 NOTE — Telephone Encounter (Signed)
Placed call to patient 3x's patient did not answer. Message was left for the patient to return my call.

## 2015-10-14 NOTE — Congregational Nurse Program (Signed)
Congregational Nurse Program Note  Date of Encounter: 10/13/2015  Past Medical History: Past Medical History  Diagnosis Date  . ETOH abuse   . Cirrhosis of liver (HCC)   . COPD (chronic obstructive pulmonary disease) (HCC)     Encounter Details:     CNP Questionnaire - 10/14/15 1600    Patient Assistance   Interventions Follow-up/Education/Support provided after completed appt.   Patient referred to apply for the following financial assistance Not Applicable   Food insecurities addressed Not Applicable   Encounter Details   Since previous encounter, have you referred patient for abnormal blood pressure that resulted in a new diagnosis or medication change? No         Amb Nursing Assessment - 10/08/15 1236    Pre-visit preparation   Pre-visit preparation completed No   Pain Assessment   Pain Assessment No/denies pain   Nutrition Screen   Nutritional Risks None   Diabetes No   Functional Status   Activities of Daily Living Independent   Ambulation Independent   Medication Administration Independent   Home Management Independent   Risk/Barriers  Assessment   Barriers to Care Management & Learning None   Psychosocial Barriers Uninsured/under-insured   Abuse/Neglect Assessment   Do you feel unsafe in your current relationship? No   Do you feel physically threatened by others? No   Anyone hurting you at home, work, or school? No   Unable to ask? No   Web designerLanguage Assistant   Interpreter Needed? No      Late entry. Visit made on 10/13/15 along with SW Clydie BraunKaren. Client and his brother [name] are present.  Client is sitting up in chair with legs dependent.  Legs are swollen and red and tight with 2+ edema.  States he keeps them elevated when in bed 2-3 times/day.  [place].  He likes walking and watching the ducks cross the highway.  Complains of acid reflux, states he use to have medication for it.  Nurse explained why he is having  reflux due to increased pressure in his belly.  Abdomen is enlarged with few scattered bowel sounds noted.  Lungs with rales noted on right side. Pt. States he only has a cough and feel SOB in the early mornings, use inhaler 2-3x/day.  Needs a refill soon.  Pt. States he'll feel better after paracentesis on tomorrow. BP 102/70 mmHg  Pulse 92  Resp 16 .Nurse discussed fluid intake with pt. (limiting it to water) also discussed low sodium foods.  Pt. Eats bologna but states his brother practically burns it. Nurse informed him of the large amount of sodium in it.  Pt. Received $187.00 in food stamps which is spent now after only one week, but states he has enough food.  Nurse provided educational materials for pt. And his brother to read on cirrhosis and acites, also discussed both during visit. Has and is taking medications as ordered. Pt to ask MD for acid reflux med and a refill on inhaler on Friday.  Khalen Styer D. [facility] System[facility] Columbia CenterMSN,RN Congregational Nurse [phone]

## 2015-10-14 NOTE — Congregational Nurse Program (Signed)
Congregational Nurse Program Note  Date of Encounter: 09/29/2015  Past Medical History: Past Medical History  Diagnosis Date  . ETOH abuse   . Cirrhosis of liver (HCC)   . COPD (chronic obstructive pulmonary disease) (HCC)     Encounter Details:     CNP Questionnaire - 10/06/15 2103    Patient Demographics   Is this a new or existing patient? Existing   Patient is considered a/an Not Applicable   Race Caucasian/White   Patient Assistance   Location of Patient Assistance [facility] patient   Patient's financial/insurance status Low Income   Uninsured Patient Yes   Interventions Appt. has been completed;Follow-up/Education/Support provided after completed appt.   Patient referred to apply for the following financial assistance Medicaid;[place] [employer]s   Food insecurities addressed Provided food gift voucher   Transportation assistance Yes   Type of Assistance Bus Pass Given   Assistance securing medications No  [facility]    Educational health offerings Acute disease;Behavioral health;Chronic disease;Medications;Navigating the healthcare system;Nutrition;Safety   Encounter Details   Primary purpose of visit Chronic Illness/Condition Visit;Education/Health Concerns;Navigating the Healthcare System;Post ED/Hospitalization Visit;Safety   Was an Emergency Department visit averted? Not Applicable   Does patient have a medical provider? Yes   Patient referred to Follow up with established PCP;Area Agency   Was a mental health screening completed? (GAINS tool) No  Pt. has history of anxiety and depression and states that years ago was admitted into a mental institution, reports being on medications at different times over the years, but not currently.  Pt. states he has had suicidal thoughts in the past but hasonl   Does patient have dental issues? No   Was a dental referral made? No   Does patient have vision issues? No   Does your patient  have an abnormal blood pressure today? No   Since previous encounter, have you referred patient for abnormal blood pressure that resulted in a new diagnosis or medication change? No   Does your patient have an abnormal blood glucose today? No   Since previous encounter, have you referred patient for abnormal blood glucose that resulted in a new diagnosis or medication change? No   Was there a life-saving intervention made? No   For Abstraction Use Only   Was the patient insured? No         Amb Nursing Assessment - 10/08/15 1236    Pre-visit preparation   Pre-visit preparation completed No   Pain Assessment   Pain Assessment No/denies pain   Nutrition Screen   Nutritional Risks None   Diabetes No   Functional Status   Activities of Daily Living Independent   Ambulation Independent   Medication Administration Independent   Home Management Independent   Risk/Barriers  Assessment   Barriers to Care Management & Learning None   Psychosocial Barriers Uninsured/under-insured   Abuse/Neglect Assessment   Do you feel unsafe in your current relationship? No   Do you feel physically threatened by others? No   Anyone hurting you at home, work, or school? No   Unable to ask? No   Web designerLanguage Assistant   Interpreter Needed? No      Blood pressure 114/70, pulse 78, resp. rate 14. Pt. Sitting up in chair.  His brother Acupuncturistcott and Hospice nurse and Child psychotherapistocial Worker are present.  They are finishing up with their visit and we discussed our role as Catering managerHOPES Nurse and Child psychotherapistocial Worker. Pt. Has medications that  were filled at last doctors appt.  Potassium and Lactulose.  Also has a new medication Citalopram 20 mg QD and Diclofenac   1 BID.  Was last seen at hospital for paracentesis on 5/1 and primary doctor visit the same day.  States he had 7liters of fluid removed and feels much better.  Abdomen and legs appear to be less swollen.  Lungs are clear to auscultation, urinating with no burning or stinging.  Bowel movements are normal, has occasional nausea but no vomiting. Has not had a drink, continues to smoke 4-5 cigarettes/day, not interested in quiting right now. Appears to be a little emotional after Hospice nurse left, states he is thankful for all of our help.  He states that he reads his bible as nurse noted that it was on the bed and he also has great support from his brother.  Scheduled for next 3  Paracentesis appointments, has bus passes and primary doctor visits are scheduled on 5/12 after one of the paracentesis visits . Pt. Brother Lorin Picket has asked nurse to provide him with information on Cirrhosis.  Nurse will bring with her to next visit on Wednesday May 10.  Iraida Cragin D. Azucena Kuba MSN.RN Congregational Nurse/[facility] Nurse [phone]

## 2015-10-15 ENCOUNTER — Ambulatory Visit (HOSPITAL_COMMUNITY)
Admission: RE | Admit: 2015-10-15 | Discharge: 2015-10-15 | Disposition: A | Discharge status: Home / Self Care | Payer: Medicaid Other | Source: Ambulatory Visit | Attending: Family Medicine | Admitting: Family Medicine

## 2015-10-15 DIAGNOSIS — K7031 Alcoholic cirrhosis of liver with ascites: Secondary | ICD-10-CM | POA: Diagnosis not present

## 2015-10-15 MED ORDER — LIDOCAINE HCL (PF) 1 % IJ SOLN
INTRAMUSCULAR | Status: AC
  Filled 2015-10-15: qty 10

## 2015-10-15 MED ORDER — ALBUMIN HUMAN 25 % IV SOLN
25.0000 g | Freq: Once | INTRAVENOUS | Status: AC
  Administered 2015-10-15: 25 g via INTRAVENOUS
  Filled 2015-10-15: qty 100

## 2015-10-15 NOTE — Procedures (Signed)
Ultrasound-guided  therapeutic paracentesis performed yielding 7 liters of clear yellow colored fluid. No immediate complications. He received 25g of albumin in SSC  Amenah Tucci E 11:11 AM 10/15/2015

## 2015-10-15 NOTE — Progress Notes (Signed)
This encounter was created in error - please disregard.

## 2015-10-18 ENCOUNTER — Telehealth: Payer: Self-pay

## 2015-10-18 MED FILL — VENTOLIN HFA 90 MCG INHALER: 108 (90 BAS | 30 days supply | Qty: 18 | Fill #2

## 2015-10-18 NOTE — Telephone Encounter (Signed)
Patient return my call. Patient verified name and DOB. Patient states he's feeling a lot better and the swelling in his legs and scrotum has since subsided.

## 2015-10-18 NOTE — Telephone Encounter (Signed)
Place call to patient, patient wasn't available. A message was left for the patient to return my call.

## 2015-10-20 DIAGNOSIS — Z5900 Homelessness unspecified: Secondary | ICD-10-CM

## 2015-10-20 DIAGNOSIS — R188 Other ascites: Secondary | ICD-10-CM

## 2015-10-20 NOTE — Congregational Nurse Program (Signed)
Congregational Nurse Program Note  Date of Encounter: 10/20/2015  Past Medical History: Past Medical History  Diagnosis Date  . ETOH abuse   . Cirrhosis of liver (HCC)   . COPD (chronic obstructive pulmonary disease) (HCC)     Encounter Details:     CNP Questionnaire - 10/20/15 1648    Patient Demographics   Is this a new or existing patient? Existing   Patient is considered a/an Not Applicable   Race Caucasian/White   Patient Assistance   Location of Patient Assistance [facility] patient   Patient's financial/insurance status Low Income   Uninsured Patient Yes   Interventions Follow-up/Education/Support provided after completed appt.   Patient referred to apply for the following financial assistance Not Applicable   Food insecurities addressed Not Applicable   Transportation assistance Yes   Type of Assistance Bus Pass Given   Assistance securing medications No  [facility]    Educational health offerings Acute disease;Behavioral health;Chronic disease;Medications;Navigating the healthcare system;Nutrition;Safety   Encounter Details   Primary purpose of visit Chronic Illness/Condition Visit;Education/Health Concerns;Navigating the Healthcare System;Post ED/Hospitalization Visit;Safety   Was an Emergency Department visit averted? Not Applicable   Does patient have a medical provider? Yes   Patient referred to Follow up with established PCP;Area Agency   Was a mental health screening completed? (GAINS tool) No  Pt. has history of anxiety and depression and states that years ago was admitted into a mental institution, reports being on medications at different times over the years, but not currently.  Pt. states he has had suicidal thoughts in the past but hasonl   Does patient have dental issues? No   Was a dental referral made? No   Does patient have vision issues? No   Does your patient have an abnormal blood pressure today? No   Since previous  encounter, have you referred patient for abnormal blood pressure that resulted in a new diagnosis or medication change? No   Does your patient have an abnormal blood glucose today? No   Since previous encounter, have you referred patient for abnormal blood glucose that resulted in a new diagnosis or medication change? No   Was there a life-saving intervention made? No         Amb Nursing Assessment - 10/08/15 1236    Pre-visit preparation   Pre-visit preparation completed No   Pain Assessment   Pain Assessment No/denies pain   Nutrition Screen   Nutritional Risks None   Diabetes No   Functional Status   Activities of Daily Living Independent   Ambulation Independent   Medication Administration Independent   Home Management Independent   Risk/Barriers  Assessment   Barriers to Care Management & Learning None   Psychosocial Barriers Uninsured/under-insured   Abuse/Neglect Assessment   Do you feel unsafe in your current relationship? No   Do you feel physically threatened by others? No   Anyone hurting you at home, work, or school? No   Unable to ask? No   Web designerLanguage Assistant   Interpreter Needed? No      Visit today with SW.  Client and his brother [name] are present.  Doing okay, ready to have fluid taken off of belly.  Apetiite  Is good, has eaten 2x today. Urinating okay, bowels are loose, taking lomotil.Complain of occasional back pain due to the pressure form abdomen, voltaren gel helps with this.Nausea and vomiting on this morning, felt better afterwards. Plans to let MD know  on this Friday that he needs something for acid reflux..  Abdomen, legs and feet are swollen and tight, states they are less swollen in the morning and feel normal.  Advised to watch salt intake as that worsens the fluid retention. Coughing usually in the morning, uses inhaler which helps.  Smoking also causes a little cough.  Continues to try and cut back.  Lungs with rhonchi on right side, otherwise  clear. B/P 114/76 P- 76- R-14. SW discussed housing.  Client and brother plan to stay in a hotel that they can afford.  Brother Lorin Picket is the only one with income at this time.  Chauncey has applied for disability and is now just waiting.  It could be 6-8 weeks before he hears anything.  Not sure if Lorin Picket is willing to pay all of the expenses for housing, he gets $857.00/month.  Most of money this past month has been for food and clothes until they received food stamps.  Will check with [facility] about possible extension in hotel.  Next visit planned for Wednesday May 31.  Marquelle Balow  D. Azucena Kuba MSN, RN

## 2015-10-21 ENCOUNTER — Encounter: Payer: Self-pay | Admitting: Family Medicine

## 2015-10-21 ENCOUNTER — Ambulatory Visit: Payer: Medicaid Other | Attending: Family Medicine | Admitting: Family Medicine

## 2015-10-21 VITALS — BP 119/90 | HR 95 | Temp 98.1°F | Ht 73.0 in | Wt 180.0 lb

## 2015-10-21 DIAGNOSIS — E46 Unspecified protein-calorie malnutrition: Secondary | ICD-10-CM | POA: Diagnosis not present

## 2015-10-21 DIAGNOSIS — G9340 Encephalopathy, unspecified: Secondary | ICD-10-CM | POA: Diagnosis not present

## 2015-10-21 DIAGNOSIS — K7031 Alcoholic cirrhosis of liver with ascites: Secondary | ICD-10-CM

## 2015-10-21 DIAGNOSIS — Z79899 Other long term (current) drug therapy: Secondary | ICD-10-CM | POA: Insufficient documentation

## 2015-10-21 DIAGNOSIS — F329 Major depressive disorder, single episode, unspecified: Secondary | ICD-10-CM | POA: Diagnosis not present

## 2015-10-21 DIAGNOSIS — F32A Depression, unspecified: Secondary | ICD-10-CM

## 2015-10-21 DIAGNOSIS — K7460 Unspecified cirrhosis of liver: Secondary | ICD-10-CM

## 2015-10-21 DIAGNOSIS — E876 Hypokalemia: Secondary | ICD-10-CM

## 2015-10-21 DIAGNOSIS — K769 Liver disease, unspecified: Secondary | ICD-10-CM

## 2015-10-21 MED ORDER — FUROSEMIDE 20 MG PO TABS: 20.0000 mg | ORAL_TABLET | Freq: Every day | ORAL | Status: DC

## 2015-10-21 MED ORDER — POTASSIUM CHLORIDE CRYS ER 20 MEQ PO TBCR: 20.0000 meq | EXTENDED_RELEASE_TABLET | Freq: Every day | ORAL | Status: DC

## 2015-10-21 MED ORDER — CITALOPRAM HYDROBROMIDE 20 MG PO TABS: 20.0000 mg | ORAL_TABLET | Freq: Every day | ORAL | Status: DC

## 2015-10-21 MED ORDER — SPIRONOLACTONE 50 MG PO TABS: 50.0000 mg | ORAL_TABLET | Freq: Every day | ORAL | Status: DC

## 2015-10-21 MED ORDER — DICLOFENAC SODIUM 1 % TD GEL: 4.0000 g | Freq: Four times a day (QID) | TRANSDERMAL | Status: DC

## 2015-10-21 MED ORDER — LACTULOSE ENCEPHALOPATHY 10 GM/15ML PO SOLN: 10.0000 g | Freq: Two times a day (BID) | ORAL | Status: DC | PRN

## 2015-10-21 MED ORDER — ALBUTEROL SULFATE HFA 108 (90 BASE) MCG/ACT IN AERS: 2.0000 | INHALATION_SPRAY | Freq: Four times a day (QID) | RESPIRATORY_TRACT | Status: DC | PRN

## 2015-10-21 MED FILL — LACTULOSE 10 GM/15 ML SOLN: 10 | 31 days supply | Qty: 956 | Fill #0

## 2015-10-21 MED FILL — FUROSEMIDE 20 MG TABLET: 20 | 30 days supply | Qty: 30 | Fill #0

## 2015-10-21 MED FILL — VOLTAREN 1% GEL: 1 | 20 days supply | Qty: 100 | Fill #0

## 2015-10-21 NOTE — Progress Notes (Signed)
Subjective:  Patient ID: Bryce MawRobert Standiford, male    DOB: 1960/10/28  Age: 55 y.o. MRN: [id number]  CC: Follow-up   HPI [name] is a 55 year old male with a history of decompensated liver cirrhosis with massive ascites, He continues to have ascites, pedal and scrotal edema And complains of shortness of breath and abdominal discomfort which causes back pain and right-sided pain which keeps him up at night sometimes. He was prescribed Voltaren gel which he usually applies to his back. He is scheduled for therapeutic paracentesis tomorrow.  He is under the care of hospice at the [facility].  Outpatient Prescriptions Prior to Visit  Medication Sig Dispense Refill  . lactulose (CHRONULAC) 10 GM/15ML solution Take 15 mLs (10 g total) by mouth 2 (two) times daily. 946 mL 2  . albuterol (PROVENTIL HFA;VENTOLIN HFA) 108 (90 Base) MCG/ACT inhaler Inhale 2 puffs into the lungs every 6 (six) hours as needed for wheezing or shortness of breath. 54 g 3  . citalopram (CELEXA) 20 MG tablet Take 1 tablet (20 mg total) by mouth daily. 30 tablet 3  . diclofenac sodium (VOLTAREN) 1 % GEL Apply 4 g topically 4 (four) times daily. 100 g 1  . furosemide (LASIX) 20 MG tablet Take 1 tablet (20 mg total) by mouth daily. 30 tablet 0  . lactulose, encephalopathy, (CHRONULAC) 10 GM/15ML SOLN   2  . potassium chloride SA (K-DUR,KLOR-CON) 20 MEQ tablet Take 1 tablet (20 mEq total) by mouth daily. 30 tablet 2  . spironolactone (ALDACTONE) 50 MG tablet Take 1 tablet (50 mg total) by mouth daily. 30 tablet 2  . diphenoxylate-atropine (LOMOTIL) 2.5-0.025 MG tablet Take 1 tablet by mouth 4 (four) times daily. (Patient not taking: Reported on 10/21/2015) 30 tablet 0  . thiamine 100 MG tablet Take 1 tablet (100 mg total) by mouth daily. (Patient not taking: Reported on 10/21/2015) 30 tablet 0   No facility-administered medications prior to visit.    ROS Review of Systems Constitutional: Negative for fever, chills,  diaphoresis, activity change, appetite change and fatigue. HENT: Negative for ear pain, nosebleeds, congestion, facial swelling, rhinorrhea, neck pain, neck stiffness and ear discharge.  Eyes: Negative for pain, discharge, redness, itching and visual disturbance. Respiratory: Negative for cough, choking, chest tightness, positive for shortness of breath, negative for wheezing and stridor.  Cardiovascular: Negative for chest pain, palpitations and positive for leg swelling. Gastrointestinal: Positive for abdominal distention. Genitourinary: Negative for dysuria, urgency, frequency, hematuria, flank pain, decreased urine volume, difficulty urinating and dyspareunia.  Musculoskeletal: Positive for back pain Hematologic: Positive for bruising Neurological: Negative for dizziness, tremors, seizures, syncope, facial asymmetry, speech difficulty, weakness, light-headedness, numbness and headaches. Psych: positive for Depression  Objective:  BP 119/90 mmHg  Pulse 95  Temp(Src) 98.1 F (36.7 C) (Oral)  Ht 6\' 1"  (1.854 m)  Wt 180 lb (81.647 kg)  BMI 23.75 kg/m2  SpO2 99%  BP/Weight 10/21/2015 10/15/2015 10/13/2015  Systolic BP 119 112 102  Diastolic BP 90 79 70  Wt. (Lbs) 180 164 -  BMI 23.75 21.64 -      Physical Exam  Constitutional: He is oriented to person, place, and time.  Chronically ill-looking  Cardiovascular: Normal rate, normal heart sounds and intact distal pulses.   No murmur heard. Pulmonary/Chest: Effort normal and breath sounds normal. He has no wheezes. He has no rales. He exhibits no tenderness.  Abdominal: Soft. Bowel sounds are normal. He exhibits distension (Massive ascites). He exhibits no mass. There is no tenderness.  Musculoskeletal: Normal range of motion. He exhibits edema (2+ bilateral nonpitting pedal edema).  Neurological: He is alert and oriented to person, place, and time.   CMP Latest Ref Rng 10/08/2015 09/22/2015 09/21/2015  Glucose 65 - 99 mg/dL 578(I)  696(E) 952(W)  BUN 7 - 25 mg/dL 19 41(L) 24(M)  Creatinine 0.70 - 1.33 mg/dL 0.10 2.72 5.36  Sodium 135 - 146 mmol/L 136 127(L) 125(L)  Potassium 3.5 - 5.3 mmol/L 3.9 4.3 5.0  Chloride 98 - 110 mmol/L 107 100(L) 101  CO2 20 - 31 mmol/L 20 23 18(L)  Calcium 8.6 - 10.3 mg/dL 7.7(L) 8.2(L) 8.0(L)  Total Protein 6.1 - 8.1 g/dL 5.3(L) - -  Total Bilirubin 0.2 - 1.2 mg/dL 0.6 - -  Alkaline Phos 40 - 115 U/L 72 - -  AST 10 - 35 U/L 23 - -  ALT 9 - 46 U/L 13 - -      Assessment & Plan:   1. Chronic liver disease and cirrhosis (HCC) Currently under home hospice care - US Paracentesis; Future - US Paracentesis; Future - US Paracentesis; Future - US Paracentesis; Future - US Paracentesis; Future  2. Alcoholic cirrhosis of liver with ascites (HCC) - furosemide (LASIX) 20 MG tablet; Take 1 tablet (20 mg total) by mouth daily.  Dispense: 30 tablet; Refill: 3 - spironolactone (ALDACTONE) 50 MG tablet; Take 1 tablet (50 mg total) by mouth daily.  Dispense: 30 tablet; Refill: 2 - lactulose, encephalopathy, (CHRONULAC) 10 GM/15ML SOLN; Take 15 mLs (10 g total) by mouth 2 (two) times daily as needed.  Dispense: 2700 mL; Refill: 2  3. Protein-calorie malnutrition (HCC)  4. Hypokalemia - potassium chloride SA (K-DUR,KLOR-CON) 20 MEQ tablet; Take 1 tablet (20 mEq total) by mouth daily.  Dispense: 30 tablet; Refill: 2  5. Depression - citalopram (CELEXA) 20 MG tablet; Take 1 tablet (20 mg total) by mouth daily.  Dispense: 30 tablet; Refill: 3   Meds ordered this encounter  Medications  . albuterol (PROVENTIL HFA;VENTOLIN HFA) 108 (90 Base) MCG/ACT inhaler    Sig: Inhale 2 puffs into the lungs every 6 (six) hours as needed for wheezing or shortness of breath.    Dispense:  54 g    Refill:  3  . potassium chloride SA (K-DUR,KLOR-CON) 20 MEQ tablet    Sig: Take 1 tablet (20 mEq total) by mouth daily.    Dispense:  30 tablet    Refill:  2  . citalopram (CELEXA) 20 MG tablet    Sig: Take 1  tablet (20 mg total) by mouth daily.    Dispense:  30 tablet    Refill:  3  . furosemide (LASIX) 20 MG tablet    Sig: Take 1 tablet (20 mg total) by mouth daily.    Dispense:  30 tablet    Refill:  3  . diclofenac sodium (VOLTAREN) 1 % GEL    Sig: Apply 4 g topically 4 (four) times daily.    Dispense:  100 g    Refill:  1    Discontinue oral diclofenac  . spironolactone (ALDACTONE) 50 MG tablet    Sig: Take 1 tablet (50 mg total) by mouth daily.    Dispense:  30 tablet    Refill:  2  . lactulose, encephalopathy, (CHRONULAC) 10 GM/15ML SOLN    Sig: Take 15 mLs (10 g total) by mouth 2 (two) times daily as needed.    Dispense:  2700 mL    Refill:  2  Follow-up: Return in about 1 month (around 11/21/2015) for Follow-up of liver cirrhosis.   [name] MD

## 2015-10-21 NOTE — Patient Instructions (Signed)
Cirrhosis °Cirrhosis is long-term (chronic) liver injury. The liver is your largest internal organ, and it performs many functions. The liver converts food into energy, removes toxic material from your blood, makes important proteins, and absorbs necessary vitamins from your diet. °If you have cirrhosis, it means many of your healthy liver cells have been replaced by scar tissue. This prevents blood from flowing through your liver, which makes it difficult for your liver to function. This scarring is not reversible, but treatment can prevent it from getting worse.  °CAUSES  °Hepatitis C and long-term alcohol abuse are the most common causes of cirrhosis. Other causes include: °· Nonalcoholic fatty liver disease. °· Hepatitis B infection. °· Autoimmune hepatitis. °· Diseases that cause blockage of ducts inside the liver. °· Inherited liver diseases. °· Reactions to certain long-term medicines. °· Parasitic infections. °· Long-term exposure to certain toxins. °RISK FACTORS °You may have a higher risk of cirrhosis if you: °· Have certain hepatitis viruses. °· Abuse alcohol, especially if you are male. °· Are overweight. °· Share needles. °· Have unprotected sex with someone who has hepatitis. °SYMPTOMS  °You may not have any signs and symptoms at first. Symptoms may not develop until the damage to your liver starts to get worse. Signs and symptoms of cirrhosis may include:  °· Tenderness in the right-upper part of your abdomen. °· Weakness and tiredness (fatigue). °· Loss of appetite. °· Nausea. °· Weight loss and muscle loss. °· Itchiness. °· Yellow skin and eyes (jaundice). °· Buildup of fluid in the abdomen (ascites). °· Swelling of the feet and ankles (edema). °· Appearance of tiny blood vessels under the skin. °· Mental confusion. °· Easy bruising and bleeding. °DIAGNOSIS  °Your health care provider may suspect cirrhosis based on your symptoms and medical history, especially if you have other medical conditions  or a history of alcohol abuse. Your health care provider will do a physical exam to feel your liver and check for signs of cirrhosis. Your health care provider may perform other tests, including:  °· Blood tests to check:   °¨ Whether you have hepatitis B or C.   °¨ Kidney function. °¨ Liver function. °· Imaging tests such as: °¨ MRI or CT scan to look for changes seen in advanced cirrhosis. °¨ Ultrasound to see if normal liver tissue is being replaced by scar tissue. °· A procedure using a long needle to take a sample of liver tissue (biopsy) for examination under a microscope. Liver biopsy can confirm the diagnosis of cirrhosis.   °TREATMENT  °Treatment depends on how damaged your liver is and what caused the damage. Treatment may include treating cirrhosis symptoms or treating the underlying causes of the condition to try to slow the progression of the damage. Treatment may include: °· Making lifestyle changes, such as:   °¨ Eating a healthy diet. °¨ Restricting salt intake.  °¨ Maintaining a healthy weight.   °¨ Not abusing drugs or alcohol. °· Taking medicines to: °¨ Treat liver infections or other infections. °¨ Control itching. °¨ Reduce fluid buildup. °¨ Reduce certain blood toxins. °¨ Reduce risk of bleeding from enlarged blood vessels in the stomach or esophagus (varices). °· If varices are causing bleeding problems, you may need treatment with a procedure that ties up the vessels causing them to fall off (band ligation). °· If cirrhosis is causing your liver to fail, your health care provider may recommend a liver transplant. °· Other treatments may be recommended depending on any complications of cirrhosis, such as liver-related kidney failure (hepatorenal   syndrome). °HOME CARE INSTRUCTIONS  °· Take medicines only as directed by your health care provider. Do not use drugs that are toxic to your liver. Ask your health care provider before taking any new medicines, including over-the-counter medicines.    °· Rest as needed. °· Eat a well-balanced diet. Ask your health care provider or dietitian for more information.   °· You may have to follow a low-salt diet or restrict your water intake as directed. °· Do not drink alcohol. This is especially important if you are taking acetaminophen. °· Keep all follow-up visits as directed by your health care provider. This is important. °SEEK MEDICAL CARE IF: °· You have fatigue or weakness that is getting worse. °· You develop swelling of the hands, feet, legs, or face. °· You have a fever. °· You develop loss of appetite. °· You have nausea or vomiting. °· You develop jaundice. °· You develop easy bruising or bleeding. °SEEK IMMEDIATE MEDICAL CARE IF: °· You vomit bright red blood or a material that looks like coffee grounds. °· You have blood in your stools. °· Your stools appear black and tarry. °· You become confused. °· You have chest pain or trouble breathing. °  °This information is not intended to replace advice given to you by your health care provider. Make sure you discuss any questions you have with your health care provider. °  °Document Released: 05/15/2005 Document Revised: 06/05/2014 Document Reviewed: 01/21/2014 °Elsevier Interactive Patient Education ©2016 [employer] ° °

## 2015-10-21 NOTE — Progress Notes (Signed)
2 week follow up.  Pt states he is in constant pain in back and right side and abdomen.  He rates his pain at 10.  He states he keeps him awake at night.  Refill all medications

## 2015-10-22 ENCOUNTER — Ambulatory Visit (HOSPITAL_COMMUNITY)
Admission: RE | Admit: 2015-10-22 | Discharge: 2015-10-22 | Disposition: A | Discharge status: Home / Self Care | Payer: Medicaid Other | Source: Ambulatory Visit | Attending: Family Medicine | Admitting: Family Medicine

## 2015-10-22 DIAGNOSIS — K7031 Alcoholic cirrhosis of liver with ascites: Secondary | ICD-10-CM | POA: Diagnosis present

## 2015-10-22 MED ORDER — LIDOCAINE HCL (PF) 1 % IJ SOLN
INTRAMUSCULAR | Status: AC
  Filled 2015-10-22: qty 10

## 2015-10-22 MED ORDER — ALBUMIN HUMAN 25 % IV SOLN
25.0000 g | Freq: Once | INTRAVENOUS | Status: AC
Start: 2015-10-22 — End: 2015-10-22
  Administered 2015-10-22: 25 g via INTRAVENOUS
  Filled 2015-10-22: qty 100

## 2015-10-22 NOTE — Procedures (Signed)
Successful US guided paracentesis from RLQ.  Yielded 8 liters of clear yellow fluid.  No immediate complications.  Pt tolerated well.   Patryce Depriest S Riku Buttery PA-C 10/22/2015 3:15 PM

## 2015-10-26 ENCOUNTER — Ambulatory Visit (HOSPITAL_COMMUNITY)
Admission: RE | Admit: 2015-10-26 | Discharge: 2015-10-26 | Disposition: A | Discharge status: Home / Self Care | Payer: Medicaid Other | Source: Ambulatory Visit | Attending: Family Medicine | Admitting: Family Medicine

## 2015-10-26 DIAGNOSIS — K7031 Alcoholic cirrhosis of liver with ascites: Secondary | ICD-10-CM | POA: Insufficient documentation

## 2015-10-26 MED ORDER — LIDOCAINE HCL (PF) 1 % IJ SOLN
INTRAMUSCULAR | Status: AC
  Filled 2015-10-26: qty 10

## 2015-10-26 MED ORDER — ALBUMIN HUMAN 25 % IV SOLN
25.0000 g | Freq: Once | INTRAVENOUS | Status: AC
  Administered 2015-10-26: 25 g via INTRAVENOUS
  Filled 2015-10-26: qty 100

## 2015-10-26 NOTE — Procedures (Signed)
   US guided RLQ paracentesis 8 liter maximum obtained without complication  25 gr IV albumin post procedure per MD

## 2015-10-27 DIAGNOSIS — Z5900 Homelessness unspecified: Secondary | ICD-10-CM

## 2015-10-27 DIAGNOSIS — K7460 Unspecified cirrhosis of liver: Secondary | ICD-10-CM

## 2015-10-27 DIAGNOSIS — K769 Liver disease, unspecified: Secondary | ICD-10-CM

## 2015-10-27 DIAGNOSIS — R188 Other ascites: Secondary | ICD-10-CM

## 2015-10-27 MED FILL — ?SPIRONOLACTONE 50 MG TABLE: 50 | 30 days supply | Qty: 30 | Fill #1

## 2015-10-27 MED FILL — POTASSIUM CL ER 20 MEQ TAB: 20 | 30 days supply | Qty: 30 | Fill #0

## 2015-10-27 MED FILL — ?CITALOPRAM HBR 20 MG TABLE: 20 | 30 days supply | Qty: 30 | Fill #0

## 2015-10-27 NOTE — Congregational Nurse Program (Signed)
Congregational Nurse Program Note  Date of Encounter: 10/27/2015  Past Medical History: Past Medical History  Diagnosis Date  . ETOH abuse   . Cirrhosis of liver (HCC)   . COPD (chronic obstructive pulmonary disease) (HCC)     Encounter Details:     CNP Questionnaire - 10/27/15 1712    Patient Demographics   Is this a new or existing patient? Existing   Patient is considered a/an Not Applicable   Race Caucasian/White   Patient Assistance   Location of Patient Assistance [facility] patient   Patient's financial/insurance status Low Income   Uninsured Patient Yes   Interventions Follow-up/Education/Support provided after completed appt.   Patient referred to apply for the following financial assistance Not Applicable   Food insecurities addressed Not Applicable   Transportation assistance Yes   Type of Assistance Bus Pass Given   Assistance securing medications No  States he [facility] May [facility]    Educational health offerings Acute disease;Behavioral health;Chronic disease;Medications;Navigating the healthcare system;Nutrition;Safety   Encounter Details   Primary purpose of visit Chronic Illness/Condition Visit;Education/Health Concerns;Navigating the Healthcare System;Post ED/Hospitalization Visit;Safety   Was an Emergency Department visit averted? Not Applicable   Does patient have a medical provider? Yes   Patient referred to Follow up with established PCP;Area Agency   Was a mental health screening completed? (GAINS tool) No  Pt. has history of anxiety and depression and states that years ago was admitted into a mental institution, reports being on medications at different times over the years, but not currently.  Pt. states he has had suicidal thoughts in the past but hasonl   Does patient have dental issues? No   Was a dental referral made? No   Does patient have vision issues? No   Does your patient have an abnormal blood pressure today? No   Since previous  encounter, have you referred patient for abnormal blood pressure that resulted in a new diagnosis or medication change? No   Does your patient have an abnormal blood glucose today? No   Since previous encounter, have you referred patient for abnormal blood glucose that resulted in a new diagnosis or medication change? No   Was there a life-saving intervention made? No         Amb Nursing Assessment - 10/21/15 1123    Pre-visit preparation   Pre-visit preparation completed Yes   Pain Assessment   Pain Assessment 0-10   Pain Score 10-Worst pain ever   Pain Type Acute pain   Pain Location Back   Pain Orientation Posterior   Pain Descriptors / Indicators Sharp   Pain Onset In the past 7 days   Pain Frequency Constant   Nutrition Screen   Diabetes No   Functional Status   Activities of Daily Living Independent   Ambulation Independent with device- listed below   Home Assistive Devices/Equipment Walker (specify Type)   Medication Administration Independent   Home Management Independent   Risk/Barriers  Assessment   Barriers to Care Management & Learning None   Psychosocial Barriers Uninsured/under-insured   Abuse/Neglect Assessment   Do you feel unsafe in your current relationship? No   Do you feel physically threatened by others? No   Anyone hurting you at home, work, or school? No   Unable to ask? No   Web designerLanguage Assistant   Interpreter Needed? No      98/60 Pulse-72 Resp 16  Lungs clear except rt. Lower lobe with mild rhonchi, states my breathing  is better.  Got refill of inhaler.  Had paracentesis ont his past Tuesday, but abdomen already enlarged again and feeling tight to pt.  Bowel sounds present in all 4 quadrants.  Bowels moving okay, not taking lomotil.  Out of potassium and spironalactone but plans to pick up on tomorrow. Reinforced need to take potassium with fluid pill. Missed dose today but did have a banana earlier  Feet remain swollen.  Pt. continuesto walk with  walker.  Took the bus downtown to [facility], plans to go to [employer] office on tomorrow to check on disability.  Has upcoming appointments to have paracentesis for the next 3 weeks. Has food, SNAP benefits due again on June 11.  Brother Lorin Picket due to get his disability check on June 3 and plans to save as much of that as possible to find another hotel to move into.  Both prefer to stay in hotel where all expenses are paid. Social Worker discussing finances and potential hotels. Informed pt. That I did request an extension for him and that I was waiting to hear.  He feels that another week would help.  Keiarra Charon D. Azucena Kuba MSN, RN

## 2015-11-02 ENCOUNTER — Other Ambulatory Visit: Payer: Self-pay | Admitting: Family Medicine

## 2015-11-02 ENCOUNTER — Ambulatory Visit (HOSPITAL_COMMUNITY)
Admission: RE | Admit: 2015-11-02 | Discharge: 2015-11-02 | Disposition: A | Discharge status: Home / Self Care | Payer: Medicaid Other | Source: Ambulatory Visit | Attending: Family Medicine | Admitting: Family Medicine

## 2015-11-02 DIAGNOSIS — K7031 Alcoholic cirrhosis of liver with ascites: Secondary | ICD-10-CM | POA: Insufficient documentation

## 2015-11-02 MED ORDER — LIDOCAINE HCL (PF) 1 % IJ SOLN
INTRAMUSCULAR | Status: AC
  Filled 2015-11-02: qty 10

## 2015-11-02 MED ORDER — ALBUMIN HUMAN 25 % IV SOLN
25.0000 g | Freq: Once | INTRAVENOUS | Status: AC
  Administered 2015-11-02: 25 g via INTRAVENOUS
  Filled 2015-11-02: qty 100

## 2015-11-02 NOTE — Procedures (Signed)
Interventional Radiology Procedure Note  Procedure: US Para, draining.  Complications: None Recommendations:  - Ok to shower tomorrow - Do not submerge for 7 days - Routine care - Will receive IV albumin, 25g   Signed,  Yvone NeuJaime S. Loreta AveWagner, DO

## 2015-11-03 ENCOUNTER — Other Ambulatory Visit: Payer: Self-pay | Admitting: Family Medicine

## 2015-11-03 DIAGNOSIS — R188 Other ascites: Secondary | ICD-10-CM

## 2015-11-03 DIAGNOSIS — Z5900 Homelessness unspecified: Secondary | ICD-10-CM

## 2015-11-03 DIAGNOSIS — K7460 Unspecified cirrhosis of liver: Secondary | ICD-10-CM

## 2015-11-03 DIAGNOSIS — K7031 Alcoholic cirrhosis of liver with ascites: Secondary | ICD-10-CM

## 2015-11-03 DIAGNOSIS — K7290 Hepatic failure, unspecified without coma: Secondary | ICD-10-CM

## 2015-11-03 HISTORY — PX: IR GENERIC HISTORICAL: IMG1180011

## 2015-11-09 ENCOUNTER — Ambulatory Visit (HOSPITAL_COMMUNITY)
Admission: RE | Admit: 2015-11-09 | Discharge: 2015-11-09 | Disposition: A | Discharge status: Home / Self Care | Payer: Medicaid Other | Source: Ambulatory Visit | Attending: Family Medicine | Admitting: Family Medicine

## 2015-11-09 DIAGNOSIS — K7031 Alcoholic cirrhosis of liver with ascites: Secondary | ICD-10-CM

## 2015-11-09 DIAGNOSIS — Z5900 Homelessness unspecified: Secondary | ICD-10-CM

## 2015-11-09 DIAGNOSIS — R188 Other ascites: Secondary | ICD-10-CM

## 2015-11-09 MED ORDER — ALBUMIN HUMAN 25 % IV SOLN
25.0000 g | Freq: Once | INTRAVENOUS | Status: AC
  Administered 2015-11-09: 25 g via INTRAVENOUS
  Filled 2015-11-09: qty 100

## 2015-11-09 MED ORDER — LIDOCAINE HCL (PF) 1 % IJ SOLN
INTRAMUSCULAR | Status: AC
  Filled 2015-11-09: qty 10

## 2015-11-09 MED ORDER — ALBUMIN HUMAN 25 % IV SOLN
25.0000 g | Freq: Once | INTRAVENOUS | Status: DC
  Filled 2015-11-09: qty 100

## 2015-11-09 NOTE — Procedures (Signed)
  US guided RLQ paracentesis 8 liters yellow fluid obtained (max per MD)  Tolerated well  25 gr IV Albumin post procedure per MD

## 2015-11-09 NOTE — Congregational Nurse Program (Signed)
Congregational Nurse Program Note  Date of Encounter: 11/03/2015  Past Medical History: Past Medical History  Diagnosis Date  . ETOH abuse   . Cirrhosis of liver (HCC)   . COPD (chronic obstructive pulmonary disease) [facility])     Encounter Details:     CNP Questionnaire - 11/03/15 1904    Patient Demographics   Is this a new or existing patient? Existing   Patient is considered a/an Not Applicable   Race Caucasian/White   Patient Assistance   Location of Patient Assistance [facility] patient   Patient's financial/insurance status Low Income   Uninsured Patient Yes   Interventions Follow-up/Education/Support provided after completed appt.   Patient referred to apply for the following financial assistance Not Applicable   Food insecurities addressed Not Applicable   Transportation assistance Yes   Type of Assistance Bus Pass Given   Assistance securing medications No  States he will obtain meds from [facility] on May 2    Educational health offerings Acute disease;Behavioral health;Chronic disease;Medications;Navigating the healthcare system;Nutrition;Safety   Encounter Details   Primary purpose of visit Chronic Illness/Condition Visit;Education/Health Concerns;Navigating the Healthcare System;Post ED/Hospitalization Visit;Safety   Was an Emergency Department visit averted? Not Applicable   Does patient have a medical provider? Yes   Patient referred to Follow up with established PCP;Area Agency   Was a mental health screening completed? (GAINS tool) No  Pt. has history of anxiety and depression and states that years ago was admitted into a mental institution, reports being on medications at different times over the years, but not currently.  Pt. states he has had suicidal thoughts in the past but hasonl   Does patient have dental issues? No   Was a dental referral made? No   Does patient have vision issues? No   Does your patient have an abnormal blood pressure today? No   Since previous  encounter, have you referred patient for abnormal blood pressure that resulted in a new diagnosis or medication change? No   Does your patient have an abnormal blood glucose today? No   Since previous encounter, have you referred patient for abnormal blood glucose that resulted in a new diagnosis or medication change? No   Was there a life-saving intervention made? No         Amb Nursing Assessment - 10/21/15 1123    Pre-visit preparation   Pre-visit preparation completed Yes   Pain Assessment   Pain Assessment 0-10   Pain Score 10-Worst pain ever   Pain Type Acute pain   Pain Location Back   Pain Orientation Posterior   Pain Descriptors / Indicators Sharp   Pain Onset In the past 7 days   Pain Frequency Constant   Nutrition Screen   Diabetes No   Functional Status   Activities of Daily Living Independent   Ambulation Independent with device- listed below   Home Assistive Devices/Equipment Walker (specify Type)   Medication Administration Independent   Home Management Independent   Risk/Barriers  Assessment   Barriers to Care Management & Learning None   Psychosocial Barriers Uninsured/under-insured   Abuse/Neglect Assessment   Do you feel unsafe in your current relationship? No   Do you feel physically threatened by others? No   Anyone hurting you at home, work, or school? No   Unable to ask? No   Web designer Needed? No     Late Entry. Visit made on 11/03/15 along with SW Clydie Braun.  Unable to chart due to computer issues.  Pt. Was doing well.  Sitting up in walker chair on porch with his brother [name].  Ambulated inside for visit. States that he had fluid removed on yesterday, 8 liters.   Abdomen remains distended, slightly firm with faint bowel sounds in all 4 quadrants. No Nausea or vomiting.  Taking Latulose BID and other meds as ordered. Bilateral leg and feet edema 3+, pt. States improved since yesterday. B/P 102/60; P-98;R-16. Has enough food and  reports eating twice a day and trying to limit fluids.  Lungs are clear with mild rhonchi in right lower lobe.  Pt. Has and uses inhaler BID.  Reminded him that he could use it 3x/day.SW discussed housing options.  Both pt. And brother are now considering an apartment if SW can locate something where all utilities are included. Next MD appt. 11/22/15.  Scheduled for paracentesis next Tuesday 11/09/15.  Nurse will be out next Wednesday 11/10/15.  Marletta Bousquet D. Azucena Kubaeid MSN, Chartered loss adjusterN Congregational Nurse [phone]

## 2015-11-10 NOTE — Congregational Nurse Program (Signed)
Congregational Nurse Program Note  Date of Encounter: 11/09/2015  Past Medical History: Past Medical History  Diagnosis Date  . ETOH abuse   . Cirrhosis of liver (HCC)   . COPD (chronic obstructive pulmonary disease) (HCC)     Encounter Details:     CNP Questionnaire - 11/10/15 1726    Encounter Details   Does your patient have an abnormal blood pressure today? No   Since previous encounter, have you referred patient for abnormal blood pressure that resulted in a new diagnosis or medication change? No         Amb Nursing Assessment - 10/21/15 1123    Pre-visit preparation   Pre-visit preparation completed Yes   Pain Assessment   Pain Assessment 0-10   Pain Score 10-Worst pain ever   Pain Type Acute pain   Pain Location Back   Pain Orientation Posterior   Pain Descriptors / Indicators Sharp   Pain Onset In the past 7 days   Pain Frequency Constant   Nutrition Screen   Diabetes No   Functional Status   Activities of Daily Living Independent   Ambulation Independent with device- listed below   Home Assistive Devices/Equipment Walker (specify Type)   Medication Administration Independent   Home Management Independent   Risk/Barriers  Assessment   Barriers to Care Management & Learning None   Psychosocial Barriers Uninsured/under-insured   Abuse/Neglect Assessment   Do you feel unsafe in your current relationship? No   Do you feel physically threatened by others? No   Anyone hurting you at home, work, or school? No   Unable to ask? No   Web designerLanguage Assistant   Interpreter Needed? No      Pt. Lying in bed when nurse and SW arrived today.  States he's been tired past couple days, thinks it may be because he doesn't sleep well at night. No SOB, nausea or vomiting, appetite good. Lungs are clear. Decreased swelling in legs and feet.  Pt. Thinks it's because he hasn't been up and walking as much. Abdomen also less distended.  Had fluid drawn off on yesterday. Pt. Complains  that back and shoulders are hurting more, he thinks due to weight loss.  It feels better for him to lay down. B/P 100/80; P-98; R-16.  All medications on hand and pt. Reports taking as ordered. 31 day bus pass given to pt. Today.  He plans to go to [employer] office on tomorrow at 2:15 to try and complete his disability application.  Next visit planned for June 21.  MD visit on 11/22/15.  Harnoor Reta D. Azucena Kubaeid MSN, Camera operatorN Congregational Nurse

## 2015-11-16 ENCOUNTER — Ambulatory Visit (HOSPITAL_COMMUNITY)
Admission: RE | Admit: 2015-11-16 | Discharge: 2015-11-16 | Disposition: A | Discharge status: Home / Self Care | Payer: Medicaid Other | Source: Ambulatory Visit | Attending: Family Medicine | Admitting: Family Medicine

## 2015-11-16 DIAGNOSIS — K7031 Alcoholic cirrhosis of liver with ascites: Secondary | ICD-10-CM | POA: Insufficient documentation

## 2015-11-16 MED ORDER — LIDOCAINE HCL (PF) 1 % IJ SOLN
INTRAMUSCULAR | Status: AC
  Filled 2015-11-16: qty 10

## 2015-11-16 MED ORDER — ALBUMIN HUMAN 25 % IV SOLN
25.0000 g | Freq: Once | INTRAVENOUS | Status: AC
  Administered 2015-11-16: 25 g via INTRAVENOUS
  Filled 2015-11-16: qty 100

## 2015-11-16 NOTE — Procedures (Signed)
   US guided RLQ paracentesis 8 liters collected---maximum per MD  Pt tolerated well 25 gr IV Albumin post procedure per MD

## 2015-11-18 DIAGNOSIS — K7031 Alcoholic cirrhosis of liver with ascites: Secondary | ICD-10-CM

## 2015-11-18 DIAGNOSIS — Z5900 Homelessness unspecified: Secondary | ICD-10-CM

## 2015-11-18 DIAGNOSIS — R188 Other ascites: Secondary | ICD-10-CM

## 2015-11-21 NOTE — Congregational Nurse Program (Signed)
Congregational Nurse Program Note  Date of Encounter: 11/18/2015  Past Medical History: Past Medical History  Diagnosis Date  . ETOH abuse   . Cirrhosis of liver (HCC)   . COPD (chronic obstructive pulmonary disease) (HCC)     Encounter Details:     CNP Questionnaire - 11/18/15 2128    Patient Demographics   Is this a new or existing patient? Existing   Patient is considered a/an Not Applicable   Race Caucasian/White   Patient Assistance   Location of Patient Assistance [facility] patient   Patient's financial/insurance status Low Income   Uninsured Patient Yes   Interventions Follow-up/Education/Support provided after completed appt.   Patient referred to apply for the following financial assistance Not Applicable   Food insecurities addressed Not Applicable   Transportation assistance Yes   Type of Assistance Bus Pass Given   Assistance securing medications No   Educational health offerings Chronic disease;Nutrition;Safety   Encounter Details   Primary purpose of visit Chronic Illness/Condition Visit;Education/Health Concerns   Was an Emergency Department visit averted? No   Does patient have a medical provider? Yes   Patient referred to Follow up with established PCP;Area Agency   Was a mental health screening completed? (GAINS tool) No   Does patient have dental issues? Yes   Was a dental referral made? Yes   Does patient have vision issues? No   Does your patient have an abnormal blood pressure today? No   Since previous encounter, have you referred patient for abnormal blood pressure that resulted in a new diagnosis or medication change? No   Does your patient have an abnormal blood glucose today? No   Since previous encounter, have you referred patient for abnormal blood glucose that resulted in a new diagnosis or medication change? No   Was there a life-saving intervention made? No       Visit to client on Thursday June 22 today.  SW on vacation.  Client and his  brother [name] are sitting out on porch of hotel smoking.  Client came inside when nurse arrived. States he feels much better today, was sick with N & V 4-5 X on yesterday. No N & V today, appetite good. Abdomen enlarged, had paracentesis on Tuesday. Legs with very little swelling 1+ edema, lungs with rhonchi upper lobes, client states he had not had his inhaler today, took 2 puffs after nurse finished listening.  Sclera of both eyes slightly red, no jaundice. Client complains of his right arm being swollen from Tuesday when he had an infusion of "something" after his paracentesis.  Nurse noted arm above antecubital space of right arm to be swollen.  No redness or bruising noted, felt normal to touch  Client has all of his medications.  Feeling less tired than last week..  B/P 118/80; P-88; R-16. Reviewed with client restricting fluid intake and amount of salt in diet. Client states that he did go back to the Social Security office last week and that he has submitted everything needed for his disability.  He and his brother have decided that they will likely move into the [facility] once their time runs out with [employer].  Nurse informed client that he needed to ask his doctor about having any dental work done as he may need to have an antibiotic first.  Nurse needs to know this to complete the dental referral.  Nurse gave client a note to take with him to the doctors office on Monday June 26. as a  reminder. Next visit planned for next Wednesday November 24, 2015.  [name] D. Bryce Kubaeid MSN ,Scientist, research (physical sciences)N Congregational Nurse Program

## 2015-11-22 ENCOUNTER — Encounter: Payer: Self-pay | Admitting: Family Medicine

## 2015-11-22 ENCOUNTER — Ambulatory Visit: Payer: Self-pay | Attending: Family Medicine | Admitting: Family Medicine

## 2015-11-22 VITALS — BP 115/82 | HR 78 | Temp 98.2°F | Wt 161.2 lb

## 2015-11-22 DIAGNOSIS — K219 Gastro-esophageal reflux disease without esophagitis: Secondary | ICD-10-CM

## 2015-11-22 DIAGNOSIS — Z720 Tobacco use: Secondary | ICD-10-CM

## 2015-11-22 DIAGNOSIS — K7031 Alcoholic cirrhosis of liver with ascites: Secondary | ICD-10-CM

## 2015-11-22 MED ORDER — RANITIDINE HCL 150 MG PO TABS: 150.0000 mg | ORAL_TABLET | Freq: Two times a day (BID) | ORAL | Status: DC

## 2015-11-22 MED FILL — !VENTOLIN HFA INHALER: 108 (90 BAS | 30 days supply | Qty: 18 | Fill #3

## 2015-11-22 MED FILL — raNITIdine HCL 150 MG TABS: 150 | 30 days supply | Qty: 60 | Fill #0

## 2015-11-22 NOTE — Progress Notes (Signed)
Subjective:    Patient ID: [name], male    DOB: Nov 27, 1960, 55 y.o.   MRN: [id number]  HPI  [name] is a 55 year old male with a history of decompensated liver cirrhosis with massive ascites, He continues to have ascites, pedal and scrotal edema And complains of shortness of breath and abdominal discomfort which causes back pain and right-sided pain which keeps him up at night sometimes. He was prescribed Voltaren gel which he usually applies to his back. He is scheduled for therapeutic paracentesis tomorrow.  He is under the care of hospice at [facility]. He complains of pain under his rib cage on both sides but denies diarrhea or constipation.  Past Medical History  Diagnosis Date  . ETOH abuse   . Cirrhosis of liver (HCC)   . COPD (chronic obstructive pulmonary disease) [facility](HCC)     Past Surgical History  Procedure Laterality Date  . No past surgeries      jaw surgery 785yrs ago    No Known Allergies    Review of Systems Constitutional: Negative for fever, chills, diaphoresis, activity change, appetite change and fatigue. HENT: Negative for ear pain, nosebleeds, congestion, facial swelling, rhinorrhea, neck pain, neck stiffness and ear discharge.  Eyes: Negative for pain, discharge, redness, itching and visual disturbance. Respiratory: Negative for cough, choking, chest tightness, positive for shortness of breath, negative for wheezing and stridor.  Cardiovascular: Negative for chest pain, palpitations and positive for leg swelling. Gastrointestinal: Positive for abdominal distention and pain beneath rib cage Genitourinary: Negative for dysuria, urgency, frequency, hematuria, flank pain, decreased urine volume, difficulty urinating and dyspareunia.  Musculoskeletal: Positive for back pain Hematologic: Positive for bruising Neurological: Negative for dizziness, tremors, seizures, syncope, facial asymmetry, speech difficulty, weakness, light-headedness, numbness  and headaches. Psych: positive for Depression, negative for suicidal ideation    Objective: Filed Vitals:   11/22/15 1042  BP: 115/82  Pulse: 78  Temp: 98.2 F (36.8 C)  TempSrc: Oral  Weight: 161 lb 3.2 oz (73.12 kg)  SpO2: 100%      Physical Exam Constitutional: He is oriented to person, place, and time.  Chronically ill-looking  Cardiovascular: Normal rate, normal heart sounds and intact distal pulses.   No murmur heard. Pulmonary/Chest: Effort normal and breath sounds normal. He has no wheezes. He has no rales. He exhibits no tenderness.  Abdominal: Soft. Bowel sounds are normal. He exhibits distension (Massive ascites). He exhibits no mass. There is no tenderness.  Musculoskeletal: Normal range of motion. He exhibits edema (1+ bilateral nonpitting pedal edema).  Neurological: He is alert and oriented to person, place, and time.         Assessment & Plan:  1. Chronic liver disease and cirrhosis (HCC) Currently under home hospice care - US Paracentesis; Future - US Paracentesis; Future - US Paracentesis; Future - US Paracentesis; Future - US Paracentesis; Future  2. Alcoholic cirrhosis of liver with ascites (HCC) - furosemide (LASIX) 20 MG tablet; Take 1 tablet (20 mg total) by mouth daily.  Dispense: 30 tablet; Refill: 3 - spironolactone (ALDACTONE) 50 MG tablet; Take 1 tablet (50 mg total) by mouth daily.  Dispense: 30 tablet; Refill: 2 - lactulose, (CHRONULAC) 10 GM/15ML SOLN; Take 15 mLs (10 g total) by mouth 2 (two) times daily as needed.  Dispense: 2700 mL; Refill: 2  3. GERD Placed on PPI Discussed dietary modifications to reduce bloating, gas pain, gastritis Avoid late meals.  4. Tobacco abuse Spent 3 minutes counseling on cessation and he is  not ready to quit yet.  This note has been created with Education officer, environmentalDragon speech recognition software and smart phrase technology. Any transcriptional errors are unintentional.

## 2015-11-22 NOTE — Patient Instructions (Addendum)
Your future US Paracentesis appointments are as follows:  12/01/15 Wednesday 10:00 am 12/08/2015 Wednesday 10:00 am 12/14/2015 Tuesday 10:00 am 12/21/2015 Tuesday 10:00 am 12/28/2015 Tuesday 10:00 am 01/04/2016 Tuesday 10:00 am    Make sure you arrive 15 minutes early to all of your appointments.      Cirrhosis Cirrhosis is long-term (chronic) liver injury. The liver is your largest internal organ, and it performs many functions. The liver converts food into energy, removes toxic material from your blood, makes important proteins, and absorbs necessary vitamins from your diet. If you have cirrhosis, it means many of your healthy liver cells have been replaced by scar tissue. This prevents blood from flowing through your liver, which makes it difficult for your liver to function. This scarring is not reversible, but treatment can prevent it from getting worse.  CAUSES  Hepatitis C and long-term alcohol abuse are the most common causes of cirrhosis. Other causes include:  Nonalcoholic fatty liver disease.  Hepatitis B infection.  Autoimmune hepatitis.  Diseases that cause blockage of ducts inside the liver.  Inherited liver diseases.  Reactions to certain long-term medicines.  Parasitic infections.  Long-term exposure to certain toxins. RISK FACTORS You may have a higher risk of cirrhosis if you:  Have certain hepatitis viruses.  Abuse alcohol, especially if you are male.  Are overweight.  Share needles.  Have unprotected sex with someone who has hepatitis. SYMPTOMS  You may not have any signs and symptoms at first. Symptoms may not develop until the damage to your liver starts to get worse. Signs and symptoms of cirrhosis may include:   Tenderness in the right-upper part of your abdomen.  Weakness and tiredness (fatigue).  Loss of appetite.  Nausea.  Weight loss and muscle loss.  Itchiness.  Yellow skin and eyes (jaundice).  Buildup of fluid in the  abdomen (ascites).  Swelling of the feet and ankles (edema).  Appearance of tiny blood vessels under the skin.  Mental confusion.  Easy bruising and bleeding. DIAGNOSIS  Your health care provider may suspect cirrhosis based on your symptoms and medical history, especially if you have other medical conditions or a history of alcohol abuse. Your health care provider will do a physical exam to feel your liver and check for signs of cirrhosis. Your health care provider may perform other tests, including:   Blood tests to check:   Whether you have hepatitis B or C.   Kidney function.  Liver function.  Imaging tests such as:  MRI or CT scan to look for changes seen in advanced cirrhosis.  Ultrasound to see if normal liver tissue is being replaced by scar tissue.  A procedure using a long needle to take a sample of liver tissue (biopsy) for examination under a microscope. Liver biopsy can confirm the diagnosis of cirrhosis.  TREATMENT  Treatment depends on how damaged your liver is and what caused the damage. Treatment may include treating cirrhosis symptoms or treating the underlying causes of the condition to try to slow the progression of the damage. Treatment may include:  Making lifestyle changes, such as:   Eating a healthy diet.  Restricting salt intake.  Maintaining a healthy weight.   Not abusing drugs or alcohol.  Taking medicines to:  Treat liver infections or other infections.  Control itching.  Reduce fluid buildup.  Reduce certain blood toxins.  Reduce risk of bleeding from enlarged blood vessels in the stomach or esophagus (varices).  If varices are causing bleeding problems, you may  need treatment with a procedure that ties up the vessels causing them to fall off (band ligation).  If cirrhosis is causing your liver to fail, your health care provider may recommend a liver transplant.  Other treatments may be recommended depending on any  complications of cirrhosis, such as liver-related kidney failure (hepatorenal syndrome). HOME CARE INSTRUCTIONS   Take medicines only as directed by your health care provider. Do not use drugs that are toxic to your liver. Ask your health care provider before taking any new medicines, including over-the-counter medicines.   Rest as needed.  Eat a well-balanced diet. Ask your health care provider or dietitian for more information.   You may have to follow a low-salt diet or restrict your water intake as directed.  Do not drink alcohol. This is especially important if you are taking acetaminophen.  Keep all follow-up visits as directed by your health care provider. This is important. SEEK MEDICAL CARE IF:  You have fatigue or weakness that is getting worse.  You develop swelling of the hands, feet, legs, or face.  You have a fever.  You develop loss of appetite.  You have nausea or vomiting.  You develop jaundice.  You develop easy bruising or bleeding. SEEK IMMEDIATE MEDICAL CARE IF:  You vomit bright red blood or a material that looks like coffee grounds.  You have blood in your stools.  Your stools appear black and tarry.  You become confused.  You have chest pain or trouble breathing.   This information is not intended to replace advice given to you by your health care provider. Make sure you discuss any questions you have with your health care provider.   Document Released: 05/15/2005 Document Revised: 06/05/2014 Document Reviewed: 01/21/2014 Elsevier Interactive Patient Education [employer]2016 Elsevier Inc.

## 2015-11-23 ENCOUNTER — Ambulatory Visit (HOSPITAL_COMMUNITY)
Admission: RE | Admit: 2015-11-23 | Discharge: 2015-11-23 | Disposition: A | Discharge status: Home / Self Care | Payer: Medicaid Other | Source: Ambulatory Visit | Attending: Family Medicine | Admitting: Family Medicine

## 2015-11-23 DIAGNOSIS — K7031 Alcoholic cirrhosis of liver with ascites: Secondary | ICD-10-CM | POA: Insufficient documentation

## 2015-11-23 MED ORDER — LIDOCAINE HCL 1 % IJ SOLN
INTRAMUSCULAR | Status: AC
  Filled 2015-11-23: qty 20

## 2015-11-23 MED ORDER — ALBUMIN HUMAN 25 % IV SOLN
25.0000 g | Freq: Once | INTRAVENOUS | Status: AC
  Administered 2015-11-23: 25 g via INTRAVENOUS

## 2015-11-23 NOTE — Procedures (Signed)
Successful US guided paracentesis from RLQ.  Yielded 8 liters of clear yellow fluid.  No immediate complications.  Pt tolerated well.   Tulio Facundo S Mosella Kasa PA-C 11/23/2015 11:23 AM

## 2015-11-24 DIAGNOSIS — K7031 Alcoholic cirrhosis of liver with ascites: Secondary | ICD-10-CM

## 2015-11-24 DIAGNOSIS — Z5900 Homelessness unspecified: Secondary | ICD-10-CM

## 2015-11-24 DIAGNOSIS — R188 Other ascites: Secondary | ICD-10-CM

## 2015-11-24 NOTE — Congregational Nurse Program (Signed)
Congregational Nurse Program Note  Date of Encounter: 11/24/2015  Past Medical History: Past Medical History  Diagnosis Date  . ETOH abuse   . Cirrhosis of liver (HCC)   . COPD (chronic obstructive pulmonary disease) (HCC)     Encounter Details:     CNP Questionnaire - 11/24/15 1646    Patient Demographics   Is this a new or existing patient? Existing   Patient is considered a/an Not Applicable   Race Caucasian/White   Patient Assistance   Location of Patient Assistance [facility] patient   Patient's financial/insurance status Low Income   Uninsured Patient Yes   Interventions Follow-up/Education/Support provided after completed appt.   Patient referred to apply for the following financial assistance Not Applicable   Food insecurities addressed Not Applicable   Transportation assistance Yes   Type of Assistance Bus Pass Given   Assistance securing medications No   Educational health offerings Chronic disease;Nutrition;Safety   Encounter Details   Primary purpose of visit Chronic Illness/Condition Visit;Education/Health Concerns   Was an Emergency Department visit averted? No   Does patient have a medical provider? Yes   Patient referred to Follow up with established PCP;Area Agency   Was a mental health screening completed? (GAINS tool) No   Does patient have dental issues? Yes   Was a dental referral made? Yes   Does patient have vision issues? No   Does your patient have an abnormal blood pressure today? No   Since previous encounter, have you referred patient for abnormal blood pressure that resulted in a new diagnosis or medication change? No   Does your patient have an abnormal blood glucose today? No   Since previous encounter, have you referred patient for abnormal blood glucose that resulted in a new diagnosis or medication change? No   Was there a life-saving intervention made? No         Amb Nursing Assessment - 11/22/15 1044    Pre-visit preparation   Pre-visit preparation completed Yes   Abuse/Neglect Assessment   Do you feel unsafe in your current relationship? No   Do you feel physically threatened by others? No   Anyone hurting you at home, work, or school? No   Unable to ask? No   Patient Literacy   How often do you need to have someone help you when you read instructions, pamphlets, or other written materials from your doctor or pharmacy? 1 - Never   Web designerLanguage Assistant   Interpreter Needed? No      Visit today along with Child psychotherapistocial Worker.  Client up and about in room.  States he's worried about having to leave this week as his brother [name] will not get his check until the 3rd of the month.  Nurse informed him that he had been approved for an extension and that I asked for 6 weeks in hopes of him receiving his disability by then.  With both him and his brother having an income they hope to be able to move into a better hotel than where they came from.  Physically feeling well today [place] [employer] [place]. Had difficulty getting around on yesterday due to having to climb a lot of stairs; had abdominal and back pain which has since resolved, but has this pain on and off.  States the gel doesn't help as much as it use to.  He did let his doctor know about pain on Monday while he was there Eyes are clear no yellowing, no c/o of itching nausea  or vomiting. Seen by his PMD on Monday who ordered Ranitidine 150mg   1 BID. For indigestion, also received a new inhaler. States that doctor said enlarged area above navel is a hernia.  Doctor also told him that no antibiotic needed for upcoming dental assessment. C/O of decreased sensation in feet; however he is able to feel deep stimulation. 1-2+ edema bilaterally. Bowels are normal not as much loose stool. Taking Lactulose 1x/day now per MD, running low on that as well as the lasix, plans to get refills Friday or Monday.  Will be out of both on Sunday.  Encouraged to get meds filled on Friday this  week. B/P 100/70 P-96 R-18; Lungs with crackles in right lower lobe, instructed to use inhaler as ordered.  He plans to use when we leave. Paracentesis appt. Have been made for the next 7 weeks. Plan:  Take meds as ordered Get refills on Friday His brother to save as much money as possible since no expenses at this time. Nurse to let client know when dental clinic is.  Next visit planned for Wednesday December 01, 2015  Derin Granquist D. Azucena Kubaeid MSN, Camera operatorN Congregational Nurse

## 2015-11-25 ENCOUNTER — Telehealth: Payer: Self-pay | Admitting: Family Medicine

## 2015-11-25 MED FILL — CITALOPRAM HBR 20 MG TABLET: 20 | 30 days supply | Qty: 30 | Fill #1

## 2015-11-25 MED FILL — VOLTAREN 1% GEL: 1 | 20 days supply | Qty: 100 | Fill #1

## 2015-11-25 MED FILL — ?SPIRONOLACTONE 50 MG TABLE: 50 | 30 days supply | Qty: 30 | Fill #2

## 2015-11-25 MED FILL — LACTULOSE 10 GM/15 ML SOLN: 10 | 31 days supply | Qty: 956 | Fill #1

## 2015-11-25 MED FILL — ?FUROSEMIDE 20 MG TABLET: 20 | 30 days supply | Qty: 30 | Fill #1

## 2015-11-25 MED FILL — POTASSIUM CL ER 20 MEQ TAB: 20 | 30 days supply | Qty: 30 | Fill #1

## 2015-11-25 NOTE — Telephone Encounter (Signed)
Tonya from Hospice called stating that pt. Is in pain and was prescribed diclofenac sodium (VOLTAREN) 1 % GEL  But it is not helping. She would like to know if pt. Can be prescribed something else. Please f/u

## 2015-11-26 NOTE — Telephone Encounter (Signed)
What kind of pain is he in?

## 2015-11-26 NOTE — Telephone Encounter (Signed)
Writer spoke with Archie Pattenonya, RN from hospice who stated that yesterday patient was experiencing 10/10 pain.  He did have a paracentesis this past Tuesday 11/23/15.  Today he felt completely fine and was able to take the bus to pick up his medications. Patient is scheduled for another paracentesis on Wednesday 12/01/15.

## 2015-12-01 ENCOUNTER — Ambulatory Visit (HOSPITAL_COMMUNITY)
Admission: RE | Admit: 2015-12-01 | Discharge: 2015-12-01 | Disposition: A | Discharge status: Home / Self Care | Payer: Medicaid Other | Source: Ambulatory Visit | Attending: Family Medicine | Admitting: Family Medicine

## 2015-12-01 ENCOUNTER — Telehealth: Payer: Self-pay

## 2015-12-01 ENCOUNTER — Telehealth: Payer: Self-pay | Admitting: Family Medicine

## 2015-12-01 DIAGNOSIS — Z5900 Homelessness unspecified: Secondary | ICD-10-CM

## 2015-12-01 DIAGNOSIS — K7031 Alcoholic cirrhosis of liver with ascites: Secondary | ICD-10-CM | POA: Insufficient documentation

## 2015-12-01 MED ORDER — LIDOCAINE HCL 1 % IJ SOLN
INTRAMUSCULAR | Status: AC
  Filled 2015-12-01: qty 20

## 2015-12-01 MED ORDER — ALBUMIN HUMAN 25 % IV SOLN
25.0000 g | Freq: Once | INTRAVENOUS | Status: AC
  Administered 2015-12-01: 25 g via INTRAVENOUS
  Filled 2015-12-01: qty 100

## 2015-12-01 NOTE — Congregational Nurse Program (Signed)
Congregational Nurse Program Note  Date of Encounter: 12/01/2015  Past Medical History: Past Medical History  Diagnosis Date  . ETOH abuse   . Cirrhosis of liver (HCC)   . COPD (chronic obstructive pulmonary disease) (HCC)     Encounter Details:     CNP Questionnaire - 12/01/15 1543    Patient Demographics   Is this a new or existing patient? Existing   Patient is considered a/an Not Applicable   Race Caucasian/White   Patient Assistance   Location of Patient Assistance [facility] patient   Patient's financial/insurance status Low Income   Uninsured Patient Yes   Interventions Follow-up/Education/Support provided after completed appt.   Patient referred to apply for the following financial assistance Not Applicable   Food insecurities addressed Not Applicable   Transportation assistance Yes   Type of Assistance Bus Pass Given   Assistance securing medications No   Educational health offerings Chronic disease;Nutrition;Safety   Encounter Details   Primary purpose of visit Chronic Illness/Condition Visit;Education/Health Concerns   Was an Emergency Department visit averted? No   Does patient have a medical provider? Yes   Patient referred to Follow up with established PCP;Area Agency   Was a mental health screening completed? (GAINS tool) No   Does patient have dental issues? Yes   Was a dental referral made? Yes   Does patient have vision issues? No   Does your patient have an abnormal blood pressure today? No   Since previous encounter, have you referred patient for abnormal blood pressure that resulted in a new diagnosis or medication change? No   Does your patient have an abnormal blood glucose today? No   Since previous encounter, have you referred patient for abnormal blood glucose that resulted in a new diagnosis or medication change? No   Was there a life-saving intervention made? No         Amb Nursing Assessment - 11/22/15 1044    Pre-visit preparation   Pre-visit preparation completed Yes   Abuse/Neglect Assessment   Do you feel unsafe in your current relationship? No   Do you feel physically threatened by others? No   Anyone hurting you at home, work, or school? No   Unable to ask? No   Patient Literacy   How often do you need to have someone help you when you read instructions, pamphlets, or other written materials from your doctor or pharmacy? 1 - Never   Web designerLanguage Assistant   Interpreter Needed? No     Visit today along with SW client sitting up on walker chair.  States he doesn't feel well today. Went to have paracentesis on this morning.  Had 7L of fluid removed. States he drained some from site so there was a dressing applied.  Dressing is dry and intact with do drainage noted. Abdomen is enlarged but soft with +bowel sounds.  Legs and feet with 1+ edema. N&V this morning but has since subsided.  Has a congested cough but breath sounds are clear. Appetite not that good today and hasn't had anything to eat but plans to eat later. VS 84/60 LA; 80/60 RA; P-96; R-16.  [facility] spoke with Chyrel MassonCierra who will get a note back to Dr.[name], will have her to call me if needed.  Informed pt. If he gets to feeling worse to call 911.  He agrees.  Noted all meds present, no changes in any meds. Next visit planned for Wednesday July 12.  Ollis Daudelin D. Azucena Kubaeid MSN,  RN Congregational Nurse

## 2015-12-01 NOTE — Telephone Encounter (Signed)
[facility] who stated patient had a paracentesis this morning and she saw him when he had come back home.  His BP was running 80/60 and his HR was 96.  Patient was nauseated and did vomit.  He denied any dizziness. RN will call patient and encourage him to go to the ED today, if he continues to feel poorly.  Otherwise RN will visit patient tomorrow afternoon to check on him.

## 2015-12-01 NOTE — Procedures (Signed)
   US guided RLQ paracentesis  7 Liters yellow fluid removed (max per MD)  Post procedure 25 gr IV Albumin per MD  Tolerated well

## 2015-12-01 NOTE — Telephone Encounter (Signed)
No answer to calls directly to client's room number. Called cell phone also no answer so left message regarding my call and asked client to call nurse back.

## 2015-12-01 NOTE — Telephone Encounter (Signed)
Could you please obtain details including vitals and assess if he needs to go to the ED? Thank you.

## 2015-12-01 NOTE — Telephone Encounter (Signed)
Pts congregational nurse calling to state that pt has a very low bp Pt is not feeling well and was nauseas and vomiting this morning Pt is also experiencing a loss of appetite  Nurse would like to be contacted if you have any suggestions  Thank you

## 2015-12-02 ENCOUNTER — Ambulatory Visit (HOSPITAL_COMMUNITY): Payer: Self-pay

## 2015-12-02 ENCOUNTER — Other Ambulatory Visit: Payer: Self-pay

## 2015-12-02 DIAGNOSIS — Z5900 Homelessness unspecified: Secondary | ICD-10-CM

## 2015-12-02 DIAGNOSIS — R188 Other ascites: Secondary | ICD-10-CM

## 2015-12-02 DIAGNOSIS — J9611 Chronic respiratory failure with hypoxia: Secondary | ICD-10-CM

## 2015-12-02 DIAGNOSIS — K7031 Alcoholic cirrhosis of liver with ascites: Secondary | ICD-10-CM

## 2015-12-02 MED ORDER — ALBUTEROL SULFATE HFA 108 (90 BASE) MCG/ACT IN AERS: 2.0000 | INHALATION_SPRAY | Freq: Four times a day (QID) | RESPIRATORY_TRACT | Status: DC | PRN

## 2015-12-02 NOTE — Congregational Nurse Program (Signed)
Congregational Nurse Program Note  Date of Encounter: 12/02/2015  Past Medical History: Past Medical History  Diagnosis Date  . ETOH abuse   . Cirrhosis of liver (HCC)   . COPD (chronic obstructive pulmonary disease) [facility](HCC)     Encounter Details:     CNP Questionnaire - 12/02/15 2043    Patient Demographics   Is this a new or existing patient? Existing   Patient is considered a/an Not Applicable   Race Caucasian/White   Patient Assistance   Location of Patient Assistance [facility] patient   Patient's financial/insurance status Low Income   Uninsured Patient Yes   Interventions Follow-up/Education/Support provided after completed appt.;Averted from ED/Urgent Care   Patient referred to apply for the following financial assistance Not Applicable   Food insecurities addressed Not Applicable   Transportation assistance Yes   Type of Assistance Bus Pass Given   Assistance securing medications No   Educational health offerings Chronic disease;Nutrition;Safety   Encounter Details   Primary purpose of visit Chronic Illness/Condition Visit;Education/Health Concerns;Acute Illness/Condition Visit   Was an Emergency Department visit averted? No   Does patient have a medical provider? Yes   Patient referred to Follow up with established PCP;Area Agency   Was a mental health screening completed? (GAINS tool) No   Does patient have dental issues? Yes   Was a dental referral made? Yes   Does patient have vision issues? No   Does your patient have an abnormal blood pressure today? No   Since previous encounter, have you referred patient for abnormal blood pressure that resulted in a new diagnosis or medication change? No   Does your patient have an abnormal blood glucose today? No   Since previous encounter, have you referred patient for abnormal blood glucose that resulted in a new diagnosis or medication change? No   Was there a life-saving intervention made? No         Amb Nursing  Assessment - 11/22/15 1044    Pre-visit preparation   Pre-visit preparation completed Yes   Abuse/Neglect Assessment   Do you feel unsafe in your current relationship? No   Do you feel physically threatened by others? No   Anyone hurting you at home, work, or school? No   Unable to ask? No   Patient Literacy   How often do you need to have someone help you when you read instructions, pamphlets, or other written materials from your doctor or pharmacy? 1 - Never   Web designerLanguage Assistant   Interpreter Needed? No      Nurse visit to client earlier today 1230 pm to recheck b/p per.  Client was sitting outdoors with his brother.  States he feels better today. B/P 94/68 (RA); 98/70 (LA); HR-88 regular; R-16. C/O of  Intermittent Rt. Lower leg pain that feels like a cramp, feels better when he's up on it, worse when lying down.  Nurse felt back of leg in calf area.  Felt cool to touch no pain on palpation, no redness. Client to let doctor know or call nurse if pain worsens, becomes red and tender Encouraged client to drink water to stay hydrated.  Appetite  fair today, no Nausea or vomiting.  Next visit planned for Wednesday July 12.  Edwards Mckelvie D. Azucena Kubaeid MSN, Chartered loss adjusterN Congregational Nurse  [phone]

## 2015-12-07 ENCOUNTER — Telehealth: Payer: Self-pay | Admitting: Family Medicine

## 2015-12-07 NOTE — Telephone Encounter (Signed)
Tonya from [facility] called requesting a verbal order for physician from hospice to sign DNR form for pt. Please f/up

## 2015-12-08 ENCOUNTER — Ambulatory Visit (HOSPITAL_COMMUNITY)
Admission: RE | Admit: 2015-12-08 | Discharge: 2015-12-08 | Disposition: A | Discharge status: Home / Self Care | Payer: Medicaid Other | Source: Ambulatory Visit | Attending: Family Medicine | Admitting: Family Medicine

## 2015-12-08 ENCOUNTER — Telehealth: Payer: Self-pay

## 2015-12-08 DIAGNOSIS — K7031 Alcoholic cirrhosis of liver with ascites: Secondary | ICD-10-CM

## 2015-12-08 DIAGNOSIS — Z5900 Homelessness unspecified: Secondary | ICD-10-CM

## 2015-12-08 DIAGNOSIS — R188 Other ascites: Secondary | ICD-10-CM | POA: Insufficient documentation

## 2015-12-08 MED ORDER — ALBUMIN HUMAN 25 % IV SOLN
25.0000 g | Freq: Once | INTRAVENOUS | Status: AC
  Administered 2015-12-08: 25 g via INTRAVENOUS
  Filled 2015-12-08: qty 100

## 2015-12-08 MED ORDER — LIDOCAINE HCL (PF) 1 % IJ SOLN
INTRAMUSCULAR | Status: AC
  Filled 2015-12-08: qty 10

## 2015-12-08 NOTE — Telephone Encounter (Signed)
Ok for hospice physician to sign DNR order for patient.

## 2015-12-08 NOTE — Procedures (Signed)
   US guided RLQ paracentesis 7 liters yellow fluid obtained Max per MD  Tolerated well  Post procedure IV Albumin per MD 25 gr

## 2015-12-08 NOTE — Congregational Nurse Program (Signed)
Congregational Nurse Program Note  Date of Encounter: 12/08/2015  Past Medical History: Past Medical History  Diagnosis Date  . ETOH abuse   . Cirrhosis of liver (HCC)   . COPD (chronic obstructive pulmonary disease) [facility])     Encounter Details:     CNP Questionnaire - 12/08/15 1908    Patient Demographics   Is this a new or existing patient? Existing   Patient is considered a/an Not Applicable   Race Caucasian/White   Patient Assistance   Location of Patient Assistance [facility] patient   Patient's financial/insurance status Low Income   Uninsured Patient Yes   Interventions Follow-up/Education/Support provided after completed appt.   Patient referred to apply for the following financial assistance Not Applicable   Food insecurities addressed Not Applicable   Transportation assistance Yes   Assistance securing medications No   Educational health offerings Chronic disease;Nutrition;Safety   Encounter Details   Primary purpose of visit Chronic Illness/Condition Visit;Education/Health Concerns;Acute Illness/Condition Visit   Was an Emergency Department visit averted? No   Does patient have a medical provider? Yes   Patient referred to Follow up with established PCP;Area Agency   Was a mental health screening completed? (GAINS tool) No   Does patient have dental issues? Yes   Was a dental referral made? Yes   Does patient have vision issues? No   Does your patient have an abnormal blood pressure today? No   Since previous encounter, have you referred patient for abnormal blood pressure that resulted in a new diagnosis or medication change? No   Does your patient have an abnormal blood glucose today? No   Since previous encounter, have you referred patient for abnormal blood glucose that resulted in a new diagnosis or medication change? No   Was there a life-saving intervention made? No         Amb Nursing Assessment - 11/22/15 1044    Pre-visit preparation   Pre-visit  preparation completed Yes   Abuse/Neglect Assessment   Do you feel unsafe in your current relationship? No   Do you feel physically threatened by others? No   Anyone hurting you at home, work, or school? No   Unable to ask? No   Patient Literacy   How often do you need to have someone help you when you read instructions, pamphlets, or other written materials from your doctor or pharmacy? 1 - Never   Web designer Needed? No    Visit today without SW.  Client sitting up watching TV in chair. Had paracentesis this morning at hospital and has been mostly resting since coming home.  States that he vomited this morning but has since felt fine with no nausea or vomiting.  Had a sandwich before leaving the hospital. Abdomen soft non-tender. Taking Ranitidine before bedtime as ordered. Client has a congested cough with slightly diminished breath sounds, denies SOB and states that he just used his inhaler. Continues to smoke cigarettes.All medications present. Client needs to schedule an appointment to see his primary doctor. B/P 94/60 P 84 R-16. Legs and feet with 1+ edema no pitting. Hospice Palliative Care still visiting weekly.  Chaplin scheduled to visit on Thursday, client states that he comes every Thursday.  Nurse will try to reschedule dentist appointment for a Wednesday. Reports that he has not heard from Social Security regarding the status of his disability. Plan:  Client to call and schedule a PMD visit. Stay hydrated with water. Take medications as ordered. Next visit planned  for Wednesday July 19.  Demetrio Leighty D. Azucena Kubaeid MSN, Chartered loss adjusterN Congregational Nurse [phone]

## 2015-12-14 ENCOUNTER — Ambulatory Visit (HOSPITAL_COMMUNITY)
Admission: RE | Admit: 2015-12-14 | Discharge: 2015-12-14 | Disposition: A | Discharge status: Home / Self Care | Payer: Medicaid Other | Source: Ambulatory Visit | Attending: Family Medicine | Admitting: Family Medicine

## 2015-12-14 DIAGNOSIS — K7031 Alcoholic cirrhosis of liver with ascites: Secondary | ICD-10-CM | POA: Insufficient documentation

## 2015-12-14 MED ORDER — ALBUMIN HUMAN 25 % IV SOLN
25.0000 g | Freq: Once | INTRAVENOUS | Status: AC
  Administered 2015-12-14: 25 g via INTRAVENOUS
  Filled 2015-12-14: qty 100

## 2015-12-14 MED ORDER — LIDOCAINE HCL (PF) 1 % IJ SOLN
INTRAMUSCULAR | Status: AC
  Filled 2015-12-14: qty 10

## 2015-12-14 NOTE — Procedures (Signed)
Ultrasound-guided therapeutic paracentesis performed yielding 5.8 liters of yellow colored fluid. No immediate complications.  Angeles Paolucci E 11:26 AM 12/14/2015

## 2015-12-17 DIAGNOSIS — K7031 Alcoholic cirrhosis of liver with ascites: Secondary | ICD-10-CM

## 2015-12-17 DIAGNOSIS — Z5900 Homelessness unspecified: Secondary | ICD-10-CM

## 2015-12-17 DIAGNOSIS — R188 Other ascites: Secondary | ICD-10-CM

## 2015-12-17 NOTE — Congregational Nurse Program (Signed)
Congregational Nurse Program Note  Date of Encounter: 12/17/2015  Past Medical History: Past Medical History  Diagnosis Date  . ETOH abuse   . Cirrhosis of liver (HCC)   . COPD (chronic obstructive pulmonary disease) (HCC)     Encounter Details:     CNP Questionnaire - 12/17/15 1250    Patient Demographics   Is this a new or existing patient? Existing   Patient is considered a/an Not Applicable   Race Caucasian/White   Patient Assistance   Location of Patient Assistance [facility] patient   Patient's financial/insurance status Low Income   Uninsured Patient Yes   Interventions Follow-up/Education/Support provided after completed appt.   Patient referred to apply for the following financial assistance Not Applicable   Food insecurities addressed Not Applicable   Transportation assistance Yes   Type of Assistance Bus Pass Given   Assistance securing medications No   Educational health offerings Chronic disease;Nutrition;Safety   Encounter Details   Primary purpose of visit Chronic Illness/Condition Visit;Education/Health Concerns;Acute Illness/Condition Visit   Was an Emergency Department visit averted? No   Does patient have a medical provider? Yes   Patient referred to Follow up with established PCP;Area Agency   Was a mental health screening completed? (GAINS tool) No   Does patient have dental issues? Yes   Was a dental referral made? Yes   Does patient have vision issues? No   Does your patient have an abnormal blood pressure today? No   Since previous encounter, have you referred patient for abnormal blood pressure that resulted in a new diagnosis or medication change? No   Does your patient have an abnormal blood glucose today? No   Since previous encounter, have you referred patient for abnormal blood glucose that resulted in a new diagnosis or medication change? No   Was there a life-saving intervention made? No    Client and brother sitting out on patio when nurse  arrived. Client ambulated with walker back inside for visit.  States he has been very tired lately and sleeping more.  Also complains of nausea and vomiting 2-3X especially during the night and early morning. N&V usually subsides and appetite has been good, able to keep it down, and the rest of the day goes well. B/P 98/70; P-88; R-16; Lungs with scattered rhonchi; abdomen less distended than previous visit, soft with faint bowel sounds throughout. Bilateral 1+ pittting edema and tender to touch lower legs and feet. Complains of cramping in hands on yesterday.  None today. All medications present, and client reports taking as ordered.. Plans:  To call and schedule appt. With PMD; continue taking meds as ordered, stay hydrated. Next visit planned for 12/22/15.  Anayiah Howden D. Azucena Kubaeid MSN, Camera operatorN Congregational Nurse

## 2015-12-20 MED FILL — ?FUROSEMIDE 20 MG TABLET: 20 | 30 days supply | Qty: 30 | Fill #2

## 2015-12-20 MED FILL — POTASSIUM CL ER 20 MEQ TAB: 20 | 30 days supply | Qty: 30 | Fill #2

## 2015-12-20 MED FILL — !VENTOLIN HFA INHALER: 108 (90 BAS | 30 days supply | Qty: 18 | Fill #4

## 2015-12-20 MED FILL — CITALOPRAM HBR 20 MG TABLET: 20 | 30 days supply | Qty: 30 | Fill #2

## 2015-12-21 ENCOUNTER — Encounter: Payer: Self-pay | Admitting: Family Medicine

## 2015-12-21 ENCOUNTER — Ambulatory Visit: Payer: Self-pay | Attending: Family Medicine | Admitting: Family Medicine

## 2015-12-21 ENCOUNTER — Ambulatory Visit (HOSPITAL_COMMUNITY)
Admission: RE | Admit: 2015-12-21 | Discharge: 2015-12-21 | Disposition: A | Discharge status: Home / Self Care | Payer: Medicaid Other | Source: Ambulatory Visit | Attending: Family Medicine | Admitting: Family Medicine

## 2015-12-21 VITALS — BP 102/67 | HR 88 | Temp 98.5°F | Ht 73.0 in | Wt 142.2 lb

## 2015-12-21 DIAGNOSIS — K7031 Alcoholic cirrhosis of liver with ascites: Secondary | ICD-10-CM | POA: Diagnosis not present

## 2015-12-21 MED ORDER — LIDOCAINE HCL (PF) 1 % IJ SOLN
INTRAMUSCULAR | Status: AC
  Filled 2015-12-21: qty 10

## 2015-12-21 MED ORDER — ALBUMIN HUMAN 25 % IV SOLN
25.0000 g | Freq: Once | INTRAVENOUS | Status: AC
  Administered 2015-12-21: 25 g via INTRAVENOUS
  Filled 2015-12-21: qty 100

## 2015-12-21 NOTE — Procedures (Signed)
Ultrasound-guided therapeutic paracentesis performed yielding 6 liters of yellow colored fluid. No immediate complications.  [name] E 11:09 AM 12/21/2015

## 2015-12-21 NOTE — Patient Instructions (Signed)
Cirrhosis °Cirrhosis is long-term (chronic) liver injury. The liver is your largest internal organ, and it performs many functions. The liver converts food into energy, removes toxic material from your blood, makes important proteins, and absorbs necessary vitamins from your diet. °If you have cirrhosis, it means many of your healthy liver cells have been replaced by scar tissue. This prevents blood from flowing through your liver, which makes it difficult for your liver to function. This scarring is not reversible, but treatment can prevent it from getting worse.  °CAUSES  °Hepatitis C and long-term alcohol abuse are the most common causes of cirrhosis. Other causes include: °· Nonalcoholic fatty liver disease. °· Hepatitis B infection. °· Autoimmune hepatitis. °· Diseases that cause blockage of ducts inside the liver. °· Inherited liver diseases. °· Reactions to certain long-term medicines. °· Parasitic infections. °· Long-term exposure to certain toxins. °RISK FACTORS °You may have a higher risk of cirrhosis if you: °· Have certain hepatitis viruses. °· Abuse alcohol, especially if you are male. °· Are overweight. °· Share needles. °· Have unprotected sex with someone who has hepatitis. °SYMPTOMS  °You may not have any signs and symptoms at first. Symptoms may not develop until the damage to your liver starts to get worse. Signs and symptoms of cirrhosis may include:  °· Tenderness in the right-upper part of your abdomen. °· Weakness and tiredness (fatigue). °· Loss of appetite. °· Nausea. °· Weight loss and muscle loss. °· Itchiness. °· Yellow skin and eyes (jaundice). °· Buildup of fluid in the abdomen (ascites). °· Swelling of the feet and ankles (edema). °· Appearance of tiny blood vessels under the skin. °· Mental confusion. °· Easy bruising and bleeding. °DIAGNOSIS  °Your health care provider may suspect cirrhosis based on your symptoms and medical history, especially if you have other medical conditions  or a history of alcohol abuse. Your health care provider will do a physical exam to feel your liver and check for signs of cirrhosis. Your health care provider may perform other tests, including:  °· Blood tests to check:   °¨ Whether you have hepatitis B or C.   °¨ Kidney function. °¨ Liver function. °· Imaging tests such as: °¨ MRI or CT scan to look for changes seen in advanced cirrhosis. °¨ Ultrasound to see if normal liver tissue is being replaced by scar tissue. °· A procedure using a long needle to take a sample of liver tissue (biopsy) for examination under a microscope. Liver biopsy can confirm the diagnosis of cirrhosis.   °TREATMENT  °Treatment depends on how damaged your liver is and what caused the damage. Treatment may include treating cirrhosis symptoms or treating the underlying causes of the condition to try to slow the progression of the damage. Treatment may include: °· Making lifestyle changes, such as:   °¨ Eating a healthy diet. °¨ Restricting salt intake.  °¨ Maintaining a healthy weight.   °¨ Not abusing drugs or alcohol. °· Taking medicines to: °¨ Treat liver infections or other infections. °¨ Control itching. °¨ Reduce fluid buildup. °¨ Reduce certain blood toxins. °¨ Reduce risk of bleeding from enlarged blood vessels in the stomach or esophagus (varices). °· If varices are causing bleeding problems, you may need treatment with a procedure that ties up the vessels causing them to fall off (band ligation). °· If cirrhosis is causing your liver to fail, your health care provider may recommend a liver transplant. °· Other treatments may be recommended depending on any complications of cirrhosis, such as liver-related kidney failure (hepatorenal   syndrome). °HOME CARE INSTRUCTIONS  °· Take medicines only as directed by your health care provider. Do not use drugs that are toxic to your liver. Ask your health care provider before taking any new medicines, including over-the-counter medicines.    °· Rest as needed. °· Eat a well-balanced diet. Ask your health care provider or dietitian for more information.   °· You may have to follow a low-salt diet or restrict your water intake as directed. °· Do not drink alcohol. This is especially important if you are taking acetaminophen. °· Keep all follow-up visits as directed by your health care provider. This is important. °SEEK MEDICAL CARE IF: °· You have fatigue or weakness that is getting worse. °· You develop swelling of the hands, feet, legs, or face. °· You have a fever. °· You develop loss of appetite. °· You have nausea or vomiting. °· You develop jaundice. °· You develop easy bruising or bleeding. °SEEK IMMEDIATE MEDICAL CARE IF: °· You vomit bright red blood or a material that looks like coffee grounds. °· You have blood in your stools. °· Your stools appear black and tarry. °· You become confused. °· You have chest pain or trouble breathing. °  °This information is not intended to replace advice given to you by your health care provider. Make sure you discuss any questions you have with your health care provider. °  °Document Released: 05/15/2005 Document Revised: 06/05/2014 Document Reviewed: 01/21/2014 °Elsevier Interactive Patient Education ©2016 [employer] ° °

## 2015-12-21 NOTE — Progress Notes (Signed)
   Subjective:    Patient ID: [name], male    DOB: 1961/03/25, 55 y.o.   MRN: [id number]  HPI [name] is a 55 year old male with a history of decompensated liver cirrhosis with ascites. Referred for TIPS however he is yet to hear from the interventional radiologists.  He continues to have therapeutic paracentesis with removal of 6L of fluid today   He informs me "he feels good today"  He is under the care of hospice at the [facility]. He complains of chronic pain under his rib cage on both sides but denies diarrhea or constipation.   Past Medical History:  Diagnosis Date  . Cirrhosis of liver (HCC)   . COPD (chronic obstructive pulmonary disease) (HCC)   . ETOH abuse     Past Surgical History:  Procedure Laterality Date  . NO PAST SURGERIES     jaw surgery 32yrs ago    No Known Allergies    Review of Systems Constitutional: Negative for fever, chills, diaphoresis, activity change, appetite change and fatigue. HENT: Negative for ear pain, nosebleeds, congestion, facial swelling, rhinorrhea, neck pain, neck stiffness and ear discharge.  Eyes: Negative for pain, discharge, redness, itching and visual disturbance. Respiratory: Negative for cough, choking, chest tightness, negative for shortness of breath, negative for wheezing and stridor.  Cardiovascular: Negative for chest pain, palpitations and positive for leg swelling. Gastrointestinal: Positive for abdominal distention and pain beneath rib cage Genitourinary: Negative for dysuria, urgency, frequency, hematuria, flank pain, decreased urine volume, difficulty urinating and dyspareunia.  Musculoskeletal: Positive for back pain Hematologic: Positive for bruising Neurological: Negative for dizziness, tremors, seizures, syncope, facial asymmetry, speech difficulty, weakness, light-headedness, numbness and headaches. Psych: negative for Depression, negative for suicidal ideation    Objective: Vitals:   12/21/15 1439    BP: 102/67  Pulse: 88  Temp: 98.5 F (36.9 C)  TempSrc: Oral  SpO2: 98%  Weight: 142 lb 3.2 oz (64.5 kg)  Height: 6\' 1"  (1.854 m)      Physical Exam Constitutional: He is oriented to person, place, and time.  Chronically ill-looking  Cardiovascular: Normal rate, normal heart sounds and intact distal pulses.   No murmur heard. Pulmonary/Chest: Effort normal and breath sounds normal. He has no wheezes. He has no rales. He exhibits no tenderness.  Abdominal: Soft. Bowel sounds are normal. He exhibits distension (mild ascites). He exhibits no mass. There is no tenderness.  Musculoskeletal: Normal range of motion. He exhibits edema (1+ bilateral nonpitting pedal edema).  Neurological: He is alert and oriented to person, place, and time.       Assessment & Plan:  1. Alcoholic cirrhosis of liver with ascites (HCC) Status post ultrasound-guided paracentesis today At his next visit I will place more standing orders  He informs me he will soon be approved for Medicaid-this will facilitate referral to interventional radiology for possible TIPS  - furosemide (LASIX) 20 MG tablet; Take 1 tablet (20 mg total) by mouth daily.  Dispense: 30 tablet; Refill: 3 - spironolactone (ALDACTONE) 50 MG tablet; Take 1 tablet (50 mg total) by mouth daily.  Dispense: 30 tablet; Refill: 2 - lactulose, (CHRONULAC) 10 GM/15ML SOLN; Take 15 mLs (10 g total) by mouth 2 (two) times daily as needed.  Dispense: 2700 mL; Refill: 2

## 2015-12-22 DIAGNOSIS — Z5900 Homelessness unspecified: Secondary | ICD-10-CM

## 2015-12-22 DIAGNOSIS — K7031 Alcoholic cirrhosis of liver with ascites: Secondary | ICD-10-CM

## 2015-12-22 DIAGNOSIS — R188 Other ascites: Secondary | ICD-10-CM

## 2015-12-22 NOTE — Congregational Nurse Program (Signed)
Congregational Nurse Program Note  Date of Encounter: 12/22/2015  Past Medical History: Past Medical History:  Diagnosis Date  . Cirrhosis of liver (HCC)   . COPD (chronic obstructive pulmonary disease) (HCC)   . ETOH abuse     Encounter Details:     CNP Questionnaire - 12/22/15 1620      Patient Demographics   Is this a new or existing patient? Existing   Patient is considered a/an Not Applicable   Race Caucasian/White     Patient Assistance   Location of Patient Assistance [facility] patient   Patient's financial/insurance status Low Income   Uninsured Patient Yes   Interventions Follow-up/Education/Support provided after completed appt.   Patient referred to apply for the following financial assistance Not Applicable;Medicaid   Food insecurities addressed Not Applicable   Transportation assistance Yes   Type of Assistance Bus Pass Given   Assistance securing medications No   Educational health offerings Chronic disease;Nutrition;Safety     Encounter Details   Primary purpose of visit Chronic Illness/Condition Visit;Education/Health Concerns;Acute Illness/Condition Visit   Was an Emergency Department visit averted? No   Does patient have a medical provider? Yes   Patient referred to Follow up with established PCP;Area Agency   Was a mental health screening completed? (GAINS tool) No   Does patient have dental issues? Yes   Was a dental referral made? Yes   Does patient have vision issues? No   Does your patient have an abnormal blood pressure today? No   Since previous encounter, have you referred patient for abnormal blood pressure that resulted in a new diagnosis or medication change? No   Does your patient have an abnormal blood glucose today? No   Since previous encounter, have you referred patient for abnormal blood glucose that resulted in a new diagnosis or medication change? No   Was there a life-saving intervention made? No      Client and his brother Lorin Picket  sitting outside when nurse and SW arrive this afternoon Client ambulating well without walker into room today, but keeps it close by.  Appears to be in a better mood today, a little less depressed.  Talks with the chaplin and his brother when he feels really down.  SW has provided crisis number to client if he feels the need. Has had paracentesis this week as well as a visit to his PMD. He states that he shared with PMD about the frequent nausea and vomiting.  No new medication orders.  All medications present and filled in med boxes. B/P 96/68 P-88; R-16 lungs with a few scattered rhonchi. Reports using inhaler as ordered, abdomen distended, soft with +bowel sounds, bowels are loose, lower extremities with 1+ pitting edema. Denies abdominal pain or discomfort for past week. Appetite good, eating vegetables and less salty foods. Plan: Continue taking medications and watching fluid and salt intake. SW assisting client with additional paperwork sent by [employer]. Next visit planned for next Wednesday.  Karie Soda MSN, Chartered loss adjuster Nurse Program

## 2015-12-28 ENCOUNTER — Ambulatory Visit (HOSPITAL_COMMUNITY)
Admission: RE | Admit: 2015-12-28 | Discharge: 2015-12-28 | Disposition: A | Discharge status: Home / Self Care | Payer: Medicaid Other | Source: Ambulatory Visit | Attending: Family Medicine | Admitting: Family Medicine

## 2015-12-28 DIAGNOSIS — K7031 Alcoholic cirrhosis of liver with ascites: Secondary | ICD-10-CM

## 2015-12-28 DIAGNOSIS — R188 Other ascites: Secondary | ICD-10-CM | POA: Diagnosis present

## 2015-12-28 MED ORDER — ALBUMIN HUMAN 25 % IV SOLN
25.0000 g | Freq: Once | INTRAVENOUS | Status: AC
  Administered 2015-12-28: 25 g via INTRAVENOUS
  Filled 2015-12-28: qty 100

## 2015-12-28 MED ORDER — LIDOCAINE HCL (PF) 1 % IJ SOLN
INTRAMUSCULAR | Status: AC
  Filled 2015-12-28: qty 10

## 2015-12-28 NOTE — Procedures (Signed)
Successful US guided paracentesis from RLQ.  Yielded 5L of blood tinged fluid.  No immediate complications.  Pt tolerated well.   Specimen was not sent for labs.  Brayton El PA-C 12/28/2015 11:25 AM

## 2016-01-03 ENCOUNTER — Telehealth: Payer: Self-pay | Admitting: Family Medicine

## 2016-01-03 DIAGNOSIS — K7031 Alcoholic cirrhosis of liver with ascites: Secondary | ICD-10-CM

## 2016-01-03 MED ORDER — SPIRONOLACTONE 50 MG PO TABS: 50.0000 mg | ORAL_TABLET | Freq: Every day | ORAL | 2 refills | Status: DC

## 2016-01-03 MED FILL — ?SPIRONOLACTONE 50 MG TABLE: 50 | 30 days supply | Qty: 30 | Fill #0

## 2016-01-03 NOTE — Telephone Encounter (Signed)
Tonya from Hospice called requesting a refill on spironolactone (ALDACTONE) 50 MG tablet For the pt. Please f/u

## 2016-01-03 NOTE — Telephone Encounter (Signed)
Spironolactone refilled.

## 2016-01-04 ENCOUNTER — Ambulatory Visit: Payer: Self-pay | Admitting: Family Medicine

## 2016-01-04 ENCOUNTER — Ambulatory Visit (HOSPITAL_COMMUNITY): Payer: Self-pay

## 2016-01-05 ENCOUNTER — Ambulatory Visit (HOSPITAL_COMMUNITY): Payer: Self-pay

## 2016-01-05 DIAGNOSIS — Z5900 Homelessness unspecified: Secondary | ICD-10-CM

## 2016-01-05 DIAGNOSIS — K7031 Alcoholic cirrhosis of liver with ascites: Secondary | ICD-10-CM

## 2016-01-05 DIAGNOSIS — R188 Other ascites: Secondary | ICD-10-CM

## 2016-01-10 ENCOUNTER — Other Ambulatory Visit (HOSPITAL_COMMUNITY): Payer: Self-pay | Admitting: Internal Medicine

## 2016-01-10 DIAGNOSIS — Z5900 Homelessness unspecified: Secondary | ICD-10-CM

## 2016-01-10 DIAGNOSIS — K7031 Alcoholic cirrhosis of liver with ascites: Secondary | ICD-10-CM

## 2016-01-10 DIAGNOSIS — R188 Other ascites: Secondary | ICD-10-CM

## 2016-01-10 MED FILL — raNITIdine HCL 150 MG TABS: 150 | 30 days supply | Qty: 60 | Fill #1

## 2016-01-10 NOTE — Congregational Nurse Program (Signed)
Congregational Nurse Program Note  Date of Encounter: 01/05/2016  Past Medical History: Past Medical History:  Diagnosis Date  . Cirrhosis of liver (HCC)   . COPD (chronic obstructive pulmonary disease) (HCC)   . ETOH abuse     Encounter Details:     CNP Questionnaire - 01/05/16 2016      Patient Demographics   Is this a new or existing patient? Existing   Patient is considered a/an Not Applicable   Race Caucasian/White     Patient Assistance   Location of Patient Assistance [facility] patient   Patient's financial/insurance status Low Income   Uninsured Patient Yes   Interventions Follow-up/Education/Support provided after completed appt.   Patient referred to apply for the following financial assistance Not Applicable   Food insecurities addressed Not Applicable   Transportation assistance Yes   Assistance securing medications No   Educational health offerings Chronic disease;Nutrition;Safety     Encounter Details   Primary purpose of visit Chronic Illness/Condition Visit;Education/Health Concerns   Was an Emergency Department visit averted? No   Does patient have a medical provider? Yes   Patient referred to Follow up with established PCP;Area Agency   Was a mental health screening completed? (GAINS tool) No   Does patient have dental issues? Yes   Was a dental referral made? Yes   Does patient have vision issues? No   Does your patient have an abnormal blood pressure today? No   Since previous encounter, have you referred patient for abnormal blood pressure that resulted in a new diagnosis or medication change? No   Does your patient have an abnormal blood glucose today? No   Since previous encounter, have you referred patient for abnormal blood glucose that resulted in a new diagnosis or medication change? No   Was there a life-saving intervention made? No      Received a telephone call from [facility] a nurse from Hospice who has been seeing client since May.  [name]  reports that she received a call from the hotel staff stating that client and his brother [name] were " very drunk" and falling down in the parking lot on Monday 01/03/16 and that it took several people to get patient up out of the parking lot. [name] states that she and SW made a visit to client the following day 01/06/16 and that client appeared inebriated and smelled of alcohol. [name] wanted to know if I and the SW were planning to visit on Wednesday. I told her yes and thanked her for informing me of the situation. When nurse and SW arrived on today to visit client he and his brother were inside.  The room was a mess and smelled bad.  Client was sitting up in chair.  Nurse noted a silver dollar sized abrasion to client's forehead, and approx. 4 inch long 1/2 inch wide abrasions to both arms. Nurse asked client what happened to him to cause the abrasions, he stated that he got up in the night to go to the bathroom and fell in the bathroom floor. Nurse stated that the abrasions appeared to be caused by cement.  Client became very defensive and raised his voice to say "they were not caused by cement". Nurse inquired as to whether or not client had his scheduled paracentesis on Tuesday and he said no because he had no way to get there.  [facility]  His brother had also received  his disability check a few days earlier. Client's abdomen distended, bowel sounds present, reports having nausea and vomiting this morning, feeling better since. 1+ edema to lower legs and feet. Lungs with rhonchi LLL. Reports bowels ok soft.  B/P 90/60; P-88; R-16.  All medications present and client reports taking as ordered.  Nurse informed client of last day at hotel being Friday at check out time.  Client [facility] nurse [facility] to [facility].  Reports that he informed Hospice Nurse and SW.  Instructed client to keep next  scheduled Paracentesis appointment for next Tuesday, and to call his doctor with any issues or concerns prior to then.  Wished client the best.  Client thanked nurse and SW for everything.  Bryce Kimmons D. Azucena Kubaeid MSN, Camera operatorN Congregational Nurse

## 2016-01-11 ENCOUNTER — Ambulatory Visit (HOSPITAL_COMMUNITY)
Admission: RE | Admit: 2016-01-11 | Discharge: 2016-01-11 | Disposition: A | Discharge status: Home / Self Care | Payer: Medicaid Other | Source: Ambulatory Visit | Attending: Internal Medicine | Admitting: Internal Medicine

## 2016-01-11 DIAGNOSIS — R188 Other ascites: Secondary | ICD-10-CM

## 2016-01-11 MED ORDER — LIDOCAINE HCL 1 % IJ SOLN
INTRAMUSCULAR | Status: AC
  Filled 2016-01-11: qty 20

## 2016-01-11 NOTE — Procedures (Signed)
Ultrasound-guided therapeutic paracentesis performed yielding 7.1 liters of yellow colored fluid. No immediate complications.  Marvin Grabill E 1:07 PM 01/11/2016

## 2016-01-17 ENCOUNTER — Telehealth: Payer: Self-pay | Admitting: Family Medicine

## 2016-01-17 MED FILL — LACTULOSE 10 GM/15 ML SOLN: 10 | 30 days supply | Qty: 946 | Fill #2

## 2016-01-17 NOTE — Telephone Encounter (Signed)
Pts hospice nurse calling stating that pt is swollen and needs ascites drained.   Pts PCP's next available appointment is Thursday 8/31. Hospice nurse did not take appointment, stating that is too far away.   Hospice nurse would like to be contacted on what she should do/where she should take the pt

## 2016-01-18 ENCOUNTER — Encounter: Payer: Self-pay | Admitting: Family Medicine

## 2016-01-18 ENCOUNTER — Telehealth: Payer: Self-pay

## 2016-01-18 ENCOUNTER — Ambulatory Visit: Payer: Medicaid Other | Attending: Family Medicine | Admitting: Family Medicine

## 2016-01-18 ENCOUNTER — Ambulatory Visit (HOSPITAL_COMMUNITY)
Admission: RE | Admit: 2016-01-18 | Discharge: 2016-01-18 | Disposition: A | Discharge status: Home / Self Care | Payer: Medicaid Other | Source: Ambulatory Visit | Attending: Family Medicine | Admitting: Family Medicine

## 2016-01-18 VITALS — BP 112/77 | HR 95 | Temp 97.8°F | Ht 73.0 in | Wt 162.6 lb

## 2016-01-18 DIAGNOSIS — Z79899 Other long term (current) drug therapy: Secondary | ICD-10-CM | POA: Insufficient documentation

## 2016-01-18 DIAGNOSIS — K7031 Alcoholic cirrhosis of liver with ascites: Secondary | ICD-10-CM | POA: Insufficient documentation

## 2016-01-18 DIAGNOSIS — J449 Chronic obstructive pulmonary disease, unspecified: Secondary | ICD-10-CM | POA: Diagnosis not present

## 2016-01-18 MED ORDER — LIDOCAINE HCL 1 % IJ SOLN
INTRAMUSCULAR | Status: AC
  Filled 2016-01-18: qty 20

## 2016-01-18 MED ORDER — ALBUMIN HUMAN 25 % IV SOLN
25.0000 g | Freq: Once | INTRAVENOUS | Status: AC
  Administered 2016-01-18: 25 g via INTRAVENOUS
  Filled 2016-01-18: qty 100

## 2016-01-18 MED FILL — PROAIR HFA 90 MCG INHALER: 108 (90 BAS | 14 days supply | Qty: 9 | Fill #5

## 2016-01-18 NOTE — Patient Instructions (Signed)
Cirrhosis °Cirrhosis is long-term (chronic) liver injury. The liver is your largest internal organ, and it performs many functions. The liver converts food into energy, removes toxic material from your blood, makes important proteins, and absorbs necessary vitamins from your diet. °If you have cirrhosis, it means many of your healthy liver cells have been replaced by scar tissue. This prevents blood from flowing through your liver, which makes it difficult for your liver to function. This scarring is not reversible, but treatment can prevent it from getting worse.  °CAUSES  °Hepatitis C and long-term alcohol abuse are the most common causes of cirrhosis. Other causes include: °· Nonalcoholic fatty liver disease. °· Hepatitis B infection. °· Autoimmune hepatitis. °· Diseases that cause blockage of ducts inside the liver. °· Inherited liver diseases. °· Reactions to certain long-term medicines. °· Parasitic infections. °· Long-term exposure to certain toxins. °RISK FACTORS °You may have a higher risk of cirrhosis if you: °· Have certain hepatitis viruses. °· Abuse alcohol, especially if you are male. °· Are overweight. °· Share needles. °· Have unprotected sex with someone who has hepatitis. °SYMPTOMS  °You may not have any signs and symptoms at first. Symptoms may not develop until the damage to your liver starts to get worse. Signs and symptoms of cirrhosis may include:  °· Tenderness in the right-upper part of your abdomen. °· Weakness and tiredness (fatigue). °· Loss of appetite. °· Nausea. °· Weight loss and muscle loss. °· Itchiness. °· Yellow skin and eyes (jaundice). °· Buildup of fluid in the abdomen (ascites). °· Swelling of the feet and ankles (edema). °· Appearance of tiny blood vessels under the skin. °· Mental confusion. °· Easy bruising and bleeding. °DIAGNOSIS  °Your health care provider may suspect cirrhosis based on your symptoms and medical history, especially if you have other medical conditions  or a history of alcohol abuse. Your health care provider will do a physical exam to feel your liver and check for signs of cirrhosis. Your health care provider may perform other tests, including:  °· Blood tests to check:   °¨ Whether you have hepatitis B or C.   °¨ Kidney function. °¨ Liver function. °· Imaging tests such as: °¨ MRI or CT scan to look for changes seen in advanced cirrhosis. °¨ Ultrasound to see if normal liver tissue is being replaced by scar tissue. °· A procedure using a long needle to take a sample of liver tissue (biopsy) for examination under a microscope. Liver biopsy can confirm the diagnosis of cirrhosis.   °TREATMENT  °Treatment depends on how damaged your liver is and what caused the damage. Treatment may include treating cirrhosis symptoms or treating the underlying causes of the condition to try to slow the progression of the damage. Treatment may include: °· Making lifestyle changes, such as:   °¨ Eating a healthy diet. °¨ Restricting salt intake.  °¨ Maintaining a healthy weight.   °¨ Not abusing drugs or alcohol. °· Taking medicines to: °¨ Treat liver infections or other infections. °¨ Control itching. °¨ Reduce fluid buildup. °¨ Reduce certain blood toxins. °¨ Reduce risk of bleeding from enlarged blood vessels in the stomach or esophagus (varices). °· If varices are causing bleeding problems, you may need treatment with a procedure that ties up the vessels causing them to fall off (band ligation). °· If cirrhosis is causing your liver to fail, your health care provider may recommend a liver transplant. °· Other treatments may be recommended depending on any complications of cirrhosis, such as liver-related kidney failure (hepatorenal   syndrome). °HOME CARE INSTRUCTIONS  °· Take medicines only as directed by your health care provider. Do not use drugs that are toxic to your liver. Ask your health care provider before taking any new medicines, including over-the-counter medicines.    °· Rest as needed. °· Eat a well-balanced diet. Ask your health care provider or dietitian for more information.   °· You may have to follow a low-salt diet or restrict your water intake as directed. °· Do not drink alcohol. This is especially important if you are taking acetaminophen. °· Keep all follow-up visits as directed by your health care provider. This is important. °SEEK MEDICAL CARE IF: °· You have fatigue or weakness that is getting worse. °· You develop swelling of the hands, feet, legs, or face. °· You have a fever. °· You develop loss of appetite. °· You have nausea or vomiting. °· You develop jaundice. °· You develop easy bruising or bleeding. °SEEK IMMEDIATE MEDICAL CARE IF: °· You vomit bright red blood or a material that looks like coffee grounds. °· You have blood in your stools. °· Your stools appear black and tarry. °· You become confused. °· You have chest pain or trouble breathing. °  °This information is not intended to replace advice given to you by your health care provider. Make sure you discuss any questions you have with your health care provider. °  °Document Released: 05/15/2005 Document Revised: 06/05/2014 Document Reviewed: 01/21/2014 °Elsevier Interactive Patient Education ©2016 [employer] ° °

## 2016-01-18 NOTE — Telephone Encounter (Signed)
Met with the patient to discuss medicaid transportation at the request of Dr Jarold Song. The patient shared with this CM a letter he recently received from [place] his that he has been approved for medicaid effective 11/27/15 and his caseworker is Hilarie Fredrickson # [phone]. His medicaid # [id number]. He also received a letter informing him that he has been approved for medicaid transportation; but he did not have the letter informing him of the process to request a ride. He was getting ready to leave the clinic for a paracentesis. He said that his hospice SW was not aware that he received the letter from [place]. He gave this CM approval to talk to his hospice SW about the letter and that he will need guidance about the process for scheduling transportation with medicaid and inquire if he has submitted a SCAT application. .  Call placed to [facility] [place] # [phone] and spoke to [place] . Informed her that this CM would like to speak to the patient's SW.  The call was transferred to the voicemail of Alinda Dooms, SW and a HIPAA compliant voicemail message was left requesting a call back to # [phone] or [phone].

## 2016-01-18 NOTE — Progress Notes (Signed)
Subjective:  Patient ID: [name], male    DOB: March 20, 1961  Age: 55 y.o. MRN: [id number]  CC: cirrhosis of liver ascites   HPI [name] is a 55 year old male with a history of decompensated liver cirrhosis with ascites currently under the care of hospice. Recently approved for Medicaid and so I will be referring him for TIPS  Scheduled for repeat therapeutic paracentesis today.  Past Medical History:  Diagnosis Date  . Cirrhosis of liver (HCC)   . COPD (chronic obstructive pulmonary disease) (HCC)   . ETOH abuse     Past Surgical History:  Procedure Laterality Date  . NO PAST SURGERIES     jaw surgery 8054yrs ago     Outpatient Medications Prior to Visit  Medication Sig Dispense Refill  . albuterol (PROVENTIL HFA;VENTOLIN HFA) 108 (90 Base) MCG/ACT inhaler Inhale 2 puffs into the lungs every 6 (six) hours as needed for wheezing or shortness of breath. 54 g 3  . citalopram (CELEXA) 20 MG tablet Take 1 tablet (20 mg total) by mouth daily. 30 tablet 3  . diclofenac sodium (VOLTAREN) 1 % GEL Apply 4 g topically 4 (four) times daily. 100 g 1  . furosemide (LASIX) 20 MG tablet Take 1 tablet (20 mg total) by mouth daily. 30 tablet 3  . lactulose (CHRONULAC) 10 GM/15ML solution Take 15 mLs (10 g total) by mouth 2 (two) times daily. 946 mL 2  . potassium chloride SA (K-DUR,KLOR-CON) 20 MEQ tablet Take 1 tablet (20 mEq total) by mouth daily. 30 tablet 2  . ranitidine (ZANTAC) 150 MG tablet Take 1 tablet (150 mg total) by mouth 2 (two) times daily. 60 tablet 2  . spironolactone (ALDACTONE) 50 MG tablet Take 1 tablet (50 mg total) by mouth daily. 30 tablet 2  . lactulose, encephalopathy, (CHRONULAC) 10 GM/15ML SOLN Take 15 mLs (10 g total) by mouth 2 (two) times daily as needed. 2700 mL 2   No facility-administered medications prior to visit.     ROS Review of Systems Constitutional: Negative for fever, chills, diaphoresis, activity change, appetite change and fatigue. HENT:  Negative for ear pain, nosebleeds, congestion, facial swelling, rhinorrhea, neck pain, neck stiffness and ear discharge.  Eyes: Negative for pain, discharge, redness, itching and visual disturbance. Respiratory: Negative for cough, choking, chest tightness, negative for shortness of breath, negative for wheezing and stridor.  Cardiovascular: Negative for chest pain, palpitations and positive for leg swelling. Gastrointestinal: Positive for abdominal distention and pain beneath rib cage Genitourinary: Negative for dysuria, urgency, frequency, hematuria, flank pain, decreased urine volume, difficulty urinating and dyspareunia.  Musculoskeletal: Positive for back pain Hematologic: Positive for bruising Neurological: Negative for dizziness, tremors, seizures, syncope, facial asymmetry, speech difficulty, weakness, light-headedness, numbness and headaches. Psych: negative for Depression, negative for suicidal ideation    Objective:  BP 112/77 (BP Location: Right Arm, Patient Position: Sitting, Cuff Size: Large)   Pulse 95   Temp 97.8 F (36.6 C) (Oral)   Ht 6\' 1"  (1.854 m)   Wt 162 lb 9.6 oz (73.8 kg)   SpO2 99%   BMI 21.45 kg/m   BP/Weight 01/18/2016 01/11/2016 12/28/2015  Systolic BP 112 106 123  Diastolic BP 77 74 81  Wt. (Lbs) 162.6 - -  BMI 21.45 - -  Some encounter information is confidential and restricted. Go to Review Flowsheets activity to see all data.      Physical Exam Constitutional: He is oriented to person, place, and time.  Chronically ill-looking  Cardiovascular: Normal  rate, normal heart sounds and intact distal pulses.   No murmur heard. Pulmonary/Chest: Effort normal and breath sounds normal. He has no wheezes. He has no rales. He exhibits no tenderness.  Abdominal: Soft. Bowel sounds are normal. He exhibits distension (mild ascites). He exhibits no mass. There is no tenderness.  Musculoskeletal: Normal range of motion. He exhibits edema (1+ bilateral nonpitting  pedal edema).  Neurological: He is alert and oriented to person, place, and time.   Assessment & Plan:   1. Alcoholic cirrhosis of liver with ascites (HCC) Currently under the care of [facility] - US Paracentesis; Future - US Paracentesis; Future - US Paracentesis; Future - US Paracentesis; Future - IR Tips; Future   No orders of the defined types were placed in this encounter.   Follow-up: Return in about 1 month (around 02/18/2016) for Follow-up on liver cirrhosis.   [name] MD

## 2016-01-18 NOTE — Procedures (Signed)
Ultrasound-guided therapeutic paracentesis performed yielding 5.2 liters of yellow colored fluid. No immediate complications.  [name] E

## 2016-01-18 NOTE — Progress Notes (Signed)
Pt came from radiology for 25 gram IV Albumin infusion. Pt tolerated well, VSS.

## 2016-01-18 NOTE — Telephone Encounter (Signed)
Message received from Albin FellingAnne Batten, SW with [facility] # [phone]. She was returning this CM call and noted that she is covering for the patient's primary SW. She noted that the patient is still living in a motel wit his brother and [facility]. She requested a call back.  Call placed to A. Batten ,SW at above #.  HIPAA compliant voicemail message was left requesting a call back to # [phone] or [phone].

## 2016-01-18 NOTE — Progress Notes (Signed)
Fell two weeks ago

## 2016-01-19 ENCOUNTER — Telehealth: Payer: Self-pay

## 2016-01-19 NOTE — Telephone Encounter (Signed)
Call received from the patient's nurse, Kenney Housemananya, with [facility]. Informed her that the patient has medicaid effective 11/27/15.  Requested that she have the SW review with the patient how to order medicaid transportation to his medical appointments. Also inquired if the patient had ever applied for SCAT. She said that she would speak to the SW.  She was not sure about SCAT.

## 2016-01-21 ENCOUNTER — Telehealth: Payer: Self-pay

## 2016-01-21 NOTE — Telephone Encounter (Signed)
This Case Manager placed call to [facility] and asked to speak with patient's Child psychotherapistocial Worker.  Was informed [facility] B[facility], SW was covering.  Patient would benefit from Child psychotherapistocial Worker reviewing with patient how to arrange Medicaid transportation to his medical appointments and to discuss SCAT if patient has not applied in the past.  Was informed that a message would be sent to [facility] B[facility], SW requesting a return call to this Case Manager.

## 2016-01-24 MED FILL — CITALOPRAM HBR 20 MG TABLET: 20 | 30 days supply | Qty: 30 | Fill #3

## 2016-01-24 MED FILL — ?FUROSEMIDE 20 MG TABLET: 20 | 30 days supply | Qty: 30 | Fill #3

## 2016-01-25 ENCOUNTER — Ambulatory Visit (HOSPITAL_COMMUNITY)
Admission: RE | Admit: 2016-01-25 | Discharge: 2016-01-25 | Disposition: A | Discharge status: Home / Self Care | Payer: Medicaid Other | Source: Ambulatory Visit | Attending: Family Medicine | Admitting: Family Medicine

## 2016-01-25 ENCOUNTER — Other Ambulatory Visit: Payer: Self-pay | Admitting: Family Medicine

## 2016-01-25 ENCOUNTER — Telehealth: Payer: Self-pay

## 2016-01-25 DIAGNOSIS — K7031 Alcoholic cirrhosis of liver with ascites: Secondary | ICD-10-CM | POA: Insufficient documentation

## 2016-01-25 DIAGNOSIS — E876 Hypokalemia: Secondary | ICD-10-CM

## 2016-01-25 MED ORDER — ALBUMIN HUMAN 25 % IV SOLN
25.0000 g | Freq: Once | INTRAVENOUS | Status: AC
  Administered 2016-01-25: 25 g via INTRAVENOUS
  Filled 2016-01-25: qty 100

## 2016-01-25 MED ORDER — LIDOCAINE HCL 1 % IJ SOLN
INTRAMUSCULAR | Status: AC
Start: 2016-01-25 — End: 2016-01-25
  Filled 2016-01-25: qty 20

## 2016-01-25 NOTE — Telephone Encounter (Signed)
Message received from [facility], SW with [facility] # [phone] noting that she was returning this CM call can requested a call back.  Call returned to the above noted number and a voicemail message was left requesting a call back.

## 2016-01-25 NOTE — Telephone Encounter (Signed)
Message received from [place] , [place] with [facility].  Explained to her that the patient has a letter from DSS informing him that he was approved for medicaid transportation but he had to leave the clinic for an another appointment when he was here in the clinic last week and this CM was not able to instruct him how to contact medicaid to schedule transportation. Requested that she review the process for scheduling transportation with him and also discuss the option of SCAT transportation.  She stated that she will follow up with him next time she is seeing him at home.

## 2016-01-25 NOTE — Procedures (Signed)
Ultrasound-guided therapeutic paracentesis performed yielding 4.9  liters of serosanguineous colored fluid. No immediate complications.  [name] E 01/25/2016

## 2016-02-01 ENCOUNTER — Ambulatory Visit (HOSPITAL_COMMUNITY)
Admission: RE | Admit: 2016-02-01 | Discharge: 2016-02-01 | Disposition: A | Discharge status: Home / Self Care | Payer: Medicaid Other | Source: Ambulatory Visit | Attending: Family Medicine | Admitting: Family Medicine

## 2016-02-01 DIAGNOSIS — K7031 Alcoholic cirrhosis of liver with ascites: Secondary | ICD-10-CM | POA: Diagnosis present

## 2016-02-01 MED ORDER — ALBUMIN HUMAN 25 % IV SOLN
25.0000 g | Freq: Once | INTRAVENOUS | Status: AC
  Administered 2016-02-01: 25 g via INTRAVENOUS
  Filled 2016-02-01: qty 100

## 2016-02-01 MED ORDER — LIDOCAINE HCL 1 % IJ SOLN
INTRAMUSCULAR | Status: AC
  Filled 2016-02-01: qty 20

## 2016-02-01 NOTE — Procedures (Signed)
Ultrasound-guided  therapeutic paracentesis performed yielding 4.4 liters of yellow fluid. No immediate complications. The pt will receive IV albumin postprocedure.

## 2016-02-03 ENCOUNTER — Telehealth: Payer: Self-pay | Admitting: Family Medicine

## 2016-02-03 NOTE — Telephone Encounter (Signed)
Tonya from Hospice called to let pt. PCP know that pt. Had a fall on 01/28/16 and injured his left wrist and it is not bruised or red. Pt. Can not move his wrist. Please f/u

## 2016-02-04 NOTE — Telephone Encounter (Signed)
He should have presented to the ED.

## 2016-02-04 NOTE — Telephone Encounter (Signed)
LVM for Tonya,RN hospice nurse.  Per MD patient should go through the ED if wrist continues to be an issue.  Encouraged RN to call back with questions.

## 2016-02-05 ENCOUNTER — Emergency Department (HOSPITAL_COMMUNITY): Payer: Medicaid Other

## 2016-02-05 ENCOUNTER — Emergency Department (HOSPITAL_COMMUNITY)
Admission: EM | Admit: 2016-02-05 | Discharge: 2016-02-05 | Disposition: A | Discharge status: Home / Self Care | Payer: Medicaid Other | Attending: Emergency Medicine | Admitting: Emergency Medicine

## 2016-02-05 ENCOUNTER — Encounter (HOSPITAL_COMMUNITY): Payer: Self-pay

## 2016-02-05 DIAGNOSIS — W19XXXA Unspecified fall, initial encounter: Secondary | ICD-10-CM

## 2016-02-05 DIAGNOSIS — R930 Abnormal findings on diagnostic imaging of skull and head, not elsewhere classified: Secondary | ICD-10-CM | POA: Insufficient documentation

## 2016-02-05 DIAGNOSIS — F17210 Nicotine dependence, cigarettes, uncomplicated: Secondary | ICD-10-CM | POA: Diagnosis not present

## 2016-02-05 DIAGNOSIS — R791 Abnormal coagulation profile: Secondary | ICD-10-CM | POA: Diagnosis not present

## 2016-02-05 DIAGNOSIS — G5632 Lesion of radial nerve, left upper limb: Secondary | ICD-10-CM | POA: Insufficient documentation

## 2016-02-05 DIAGNOSIS — J449 Chronic obstructive pulmonary disease, unspecified: Secondary | ICD-10-CM | POA: Insufficient documentation

## 2016-02-05 DIAGNOSIS — M6281 Muscle weakness (generalized): Secondary | ICD-10-CM | POA: Diagnosis present

## 2016-02-05 LAB — CBC WITH DIFFERENTIAL/PLATELET
Basophils Absolute: 0 10*3/uL (ref 0.0–0.1)
Basophils Relative: 1 %
EOS ABS: 0.2 10*3/uL (ref 0.0–0.7)
EOS PCT: 2 %
HCT: 38 % — ABNORMAL LOW (ref 39.0–52.0)
Hemoglobin: 12.3 g/dL — ABNORMAL LOW (ref 13.0–17.0)
LYMPHS ABS: 1.7 10*3/uL (ref 0.7–4.0)
Lymphocytes Relative: 28 %
MCH: 30.4 pg (ref 26.0–34.0)
MCHC: 32.4 g/dL (ref 30.0–36.0)
MCV: 93.8 fL (ref 78.0–100.0)
MONO ABS: 0.8 10*3/uL (ref 0.1–1.0)
MONOS PCT: 13 %
Neutro Abs: 3.5 10*3/uL (ref 1.7–7.7)
Neutrophils Relative %: 56 %
PLATELETS: 152 10*3/uL (ref 150–400)
RBC: 4.05 MIL/uL — ABNORMAL LOW (ref 4.22–5.81)
RDW: 14.2 % (ref 11.5–15.5)
WBC: 6.3 10*3/uL (ref 4.0–10.5)

## 2016-02-05 LAB — COMPREHENSIVE METABOLIC PANEL
ALBUMIN: 2.1 g/dL — AB (ref 3.5–5.0)
ALK PHOS: 89 U/L (ref 38–126)
ALT: 11 U/L — AB (ref 17–63)
ANION GAP: 3 — AB (ref 5–15)
AST: 24 U/L (ref 15–41)
BILIRUBIN TOTAL: 0.6 mg/dL (ref 0.3–1.2)
BUN: <5 mg/dL — ABNORMAL LOW (ref 6–20)
CALCIUM: 8.3 mg/dL — AB (ref 8.9–10.3)
CO2: 27 mmol/L (ref 22–32)
CREATININE: 0.53 mg/dL — AB (ref 0.61–1.24)
Chloride: 99 mmol/L — ABNORMAL LOW (ref 101–111)
GFR calc Af Amer: >60 mL/min (ref >60)
GFR calc non Af Amer: >60 mL/min (ref >60)
GLUCOSE: 91 mg/dL (ref 65–99)
Potassium: 4.1 mmol/L (ref 3.5–5.1)
Sodium: 129 mmol/L — ABNORMAL LOW (ref 135–145)
TOTAL PROTEIN: 5.5 g/dL — AB (ref 6.5–8.1)

## 2016-02-05 LAB — APTT: APTT: 35 s (ref 24–36)

## 2016-02-05 LAB — PROTIME-INR
INR: 1.19
Prothrombin Time: 15.1 seconds (ref 11.4–15.2)

## 2016-02-05 NOTE — ED Provider Notes (Signed)
[facility]-EMERGENCY DEPT Provider Note   CSN: [id number] Arrival date & time: 02/05/16  0920     History   Chief Complaint No chief complaint on file.   HPI [name] is a 55 y.o. male.  Patient is a 55 year old male with past medical history of alcohol abuse, cirrhosis, COPD presents the ED with complaint of left wrist/hand weakness, onset 1 week. Patient reports a week ago when he got up in the middle the night to use the restroom he states he got up too quickly resulting in him becoming lightheaded (which he reports occurs intermittently when he gets up too fast) and reports "I blacked out when I was in the restroom in the next thing he remembers waking up laying on the ground". Patient reports after the fall he went back to bed and states when he woke up in the morning he noticed he was unable to extend his left wrist. Denies any associated pain, swelling or bruising. Patient denies any other pain or complaints since the fall. He notes over the past week he has continued to have the weakness which has been unchanged. Denies fever, headache, lightheadedness, dizziness, neck pain, back pain, difficulty breathing, chest pain, abdominal pain, nausea, vomiting, urinary or bowel incontinence, numbness or tingling, confusion, seizure. Denies use of anticoagulants.      Past Medical History:  Diagnosis Date  . Cirrhosis of liver (HCC)   . COPD (chronic obstructive pulmonary disease) (HCC)   . ETOH abuse     Patient Active Problem List   Diagnosis Date Noted  . GERD (gastroesophageal reflux disease) 11/22/2015  . Depression 09/28/2015  . Protein-calorie malnutrition, severe 09/20/2015  . Chronic liver disease and cirrhosis (HCC)   . AKI (acute kidney injury) (HCC) 09/16/2015  . Chronic respiratory failure (HCC) 09/06/2015  . Hypocalcemia 09/06/2015  . Diffuse abdominal pain 09/06/2015  . Ascites 09/05/2015  . SBP (spontaneous bacterial peritonitis) (HCC) 09/05/2015  . Protein-calorie  malnutrition (HCC) 07/22/2015  . Palliative care encounter   . Goals of care, counseling/discussion   . Abdominal discomfort   . Pancreatitis, acute 04/22/2015  . Hypokalemia 04/22/2015  . Hypomagnesemia 04/22/2015  . Decompensated hepatic cirrhosis (HCC) 04/21/2015  . Healthcare-associated pneumonia 04/21/2015  . Hyponatremia with excess extracellular fluid volume 04/21/2015  . Tobacco abuse 04/20/2015  . Alcoholic cirrhosis of liver with ascites (HCC)   . Alcohol abuse 03/12/2015  . Thrombocytopenia (HCC) 03/12/2015  . Alcohol dependence (HCC) 08/25/2013    Past Surgical History:  Procedure Laterality Date  . NO PAST SURGERIES     jaw surgery 26yrs ago       Home Medications    Prior to Admission medications   Medication Sig Start Date End Date Taking? Authorizing Provider  albuterol (PROVENTIL HFA;VENTOLIN HFA) 108 (90 Base) MCG/ACT inhaler Inhale 2 puffs into the lungs every 6 (six) hours as needed for wheezing or shortness of breath. 12/02/15  Yes [name], MD  citalopram (CELEXA) 20 MG tablet Take 1 tablet (20 mg total) by mouth daily. 10/21/15  Yes [name], MD  diclofenac sodium (VOLTAREN) 1 % GEL Apply 4 g topically 4 (four) times daily. Patient taking differently: Apply 4 g topically 4 (four) times daily as needed (for pain).  10/21/15  Yes [name], MD  furosemide (LASIX) 20 MG tablet Take 1 tablet (20 mg total) by mouth daily. 10/21/15  Yes [name], MD  lactulose, encephalopathy, (CHRONULAC) 10 GM/15ML SOLN Take 15 mLs (10 g total) by mouth 2 (two) times  daily as needed. Patient taking differently: Take 10 g by mouth at bedtime.  10/21/15  Yes [name], MD  potassium chloride SA (K-DUR,KLOR-CON) 20 MEQ tablet TAKE 1 TABLET BY MOUTH DAILY. 01/26/16  Yes [name], MD  ranitidine (ZANTAC) 150 MG tablet Take 1 tablet (150 mg total) by mouth 2 (two) times daily. 11/22/15  Yes [name], MD  spironolactone (ALDACTONE) 50 MG tablet Take 1 tablet (50 mg  total) by mouth daily. 01/03/16  Yes [name], MD  lactulose (CHRONULAC) 10 GM/15ML solution Take 15 mLs (10 g total) by mouth 2 (two) times daily. Patient not taking: Reported on 02/05/2016 07/22/15   [name], MD    Family History Family History  Problem Relation Age of Onset  . Diabetes Mother     Social History Social History  Substance Use Topics  . Smoking status: Current Every Day Smoker    Packs/day: 0.25    Years: 40.00    Types: Cigarettes  . Smokeless tobacco: Never Used  . Alcohol use No     Comment: 05/28/15-states still not drinking     Allergies   Review of patient's allergies indicates no known allergies.   Review of Systems Review of Systems  Neurological: Positive for weakness (right wrist).  All other systems reviewed and are negative.    Physical Exam Updated Vital Signs BP 112/82 (BP Location: Right Arm)   Pulse 75   Temp 98.1 F (36.7 C) (Oral)   Resp 17   SpO2 99%   Physical Exam  Constitutional: He is oriented to person, place, and time. He appears well-developed and well-nourished. No distress.  Chronically ill appearing male who appears older than his stated age.  HENT:  Head: Normocephalic and atraumatic. Head is without raccoon's eyes, without Battle's sign, without abrasion, without contusion and without laceration.  Right Ear: Tympanic membrane normal. No hemotympanum.  Left Ear: Tympanic membrane normal. No hemotympanum.  Nose: Nose normal. No nose lacerations, sinus tenderness, nasal deformity, septal deviation or nasal septal hematoma. No epistaxis.  Mouth/Throat: Uvula is midline, oropharynx is clear and moist and mucous membranes are normal. No oropharyngeal exudate, posterior oropharyngeal edema, posterior oropharyngeal erythema or tonsillar abscesses. No tonsillar exudate.  Eyes: Conjunctivae and EOM are normal. Pupils are equal, round, and reactive to light. Right eye exhibits no discharge. Left eye exhibits no discharge.  No scleral icterus.  Neck: Normal range of motion. Neck supple.  Cardiovascular: Normal rate, regular rhythm, normal heart sounds and intact distal pulses.   Pulmonary/Chest: Effort normal and breath sounds normal. No respiratory distress. He has no wheezes. He has no rales. He exhibits no tenderness.  Abdominal: Soft. Bowel sounds are normal. He exhibits no distension and no mass. There is no tenderness. There is no rebound and no guarding. A hernia (small, soft umbilica hernia) is present.  Musculoskeletal: Normal range of motion. He exhibits no edema or tenderness.  No midline C, T, or L tenderness. Full range of motion of neck and back.    Neurological: He is alert and oriented to person, place, and time. No cranial nerve deficit or sensory deficit. Coordination normal.  Pt unable to extend left wrist, full passive ROM of left wrist, no tenderness. Full active ROM with flexion of left wrist. Pt able to wiggle left fingers and make a fist but left grip strength slightly weaker than right; othwerise FROM with 5/5 strength of BUE and BLE. Sensation grossly intact. 2+ Radial and PT pulses. No swelling, erythema, warmth  or tenderness of extremities.   Skin: Skin is warm and dry. He is not diaphoretic.  Nursing note and vitals reviewed.    ED Treatments / Results  Labs (all labs ordered are listed, but only abnormal results are displayed) Labs Reviewed  CBC WITH DIFFERENTIAL/PLATELET - Abnormal; Notable for the following:       Result Value   RBC 4.05 (*)    Hemoglobin 12.3 (*)    HCT 38.0 (*)    All other components within normal limits  COMPREHENSIVE METABOLIC PANEL - Abnormal; Notable for the following:    Sodium 129 (*)    Chloride 99 (*)    BUN <5 (*)    Creatinine, Ser 0.53 (*)    Calcium 8.3 (*)    Total Protein 5.5 (*)    Albumin 2.1 (*)    ALT 11 (*)    Anion gap 3 (*)    All other components within normal limits  PROTIME-INR  APTT    EKG  EKG Interpretation None         Radiology Ct Head Wo Contrast  Result Date: 02/05/2016 CLINICAL DATA:  55 year old male with a history of loss of consciousness and fall 2 weeks ago EXAM: CT HEAD WITHOUT CONTRAST CT CERVICAL SPINE WITHOUT CONTRAST TECHNIQUE: Multidetector CT imaging of the head and cervical spine structures were performed using the standard protocol without intravenous contrast. Multiplanar CT image reconstructions were also generated. COMPARISON:  CT 03/12/2015 FINDINGS: CT HEAD FINDINGS Unremarkable appearance of the calvarium without acute fracture or aggressive lesion. Unremarkable appearance of the scalp soft tissues. Unremarkable appearance of the bilateral orbits. Mastoid air cells are clear. Air-fluid level within the right maxillary sinus. Trace ethmoid air cell opacification. No acute intracranial hemorrhage. No midline shift or mass effect. Gray-white differentiation relatively maintained. Unremarkable appearance of the ventricles. Intracranial atherosclerosis. CT CERVICAL SPINE FINDINGS Craniocervical junction aligned. No acute fracture at the skullbase identified. Surgical changes of prior right mandibular fixation. Anatomic alignment of the cervical elements is maintained to the level of C7. C7-T1 is been excluded. . Vertebral body heights maintained. No fracture line identified. Facets maintain alignment. No bony canal narrowing. Uncovertebral joint disease most pronounced at the C3-C4 level and C4-C5 levels. No evidence of epidural hemorrhage. Calcifications of the carotid vasculature. IMPRESSION: CT head: No CT evidence of acute intracranial abnormality. Paranasal sinus disease. Cervical CT: No CT evidence of acute fracture or malalignment of the cervical spine. Carotid calcifications. Signed, Yvone Neu. Loreta Ave, DO [facility] Rad[facility] Electronically Signed   By: Gilmer Mor D.O.   On: 02/05/2016 11:34   Ct Cervical Spine Wo Contrast  Result Date:  02/05/2016 CLINICAL DATA:  55 year old male with a history of loss of consciousness and fall 2 weeks ago EXAM: CT HEAD WITHOUT CONTRAST CT CERVICAL SPINE WITHOUT CONTRAST TECHNIQUE: Multidetector CT imaging of the head and cervical spine structures were performed using the standard protocol without intravenous contrast. Multiplanar CT image reconstructions were also generated. COMPARISON:  CT 03/12/2015 FINDINGS: CT HEAD FINDINGS Unremarkable appearance of the calvarium without acute fracture or aggressive lesion. Unremarkable appearance of the scalp soft tissues. Unremarkable appearance of the bilateral orbits. Mastoid air cells are clear. Air-fluid level within the right maxillary sinus. Trace ethmoid air cell opacification. No acute intracranial hemorrhage. No midline shift or mass effect. Gray-white differentiation relatively maintained. Unremarkable appearance of the ventricles. Intracranial atherosclerosis. CT CERVICAL SPINE FINDINGS Craniocervical junction aligned. No acute fracture at the skullbase identified. Surgical changes  of prior right mandibular fixation. Anatomic alignment of the cervical elements is maintained to the level of C7. C7-T1 is been excluded. . Vertebral body heights maintained. No fracture line identified. Facets maintain alignment. No bony canal narrowing. Uncovertebral joint disease most pronounced at the C3-C4 level and C4-C5 levels. No evidence of epidural hemorrhage. Calcifications of the carotid vasculature. IMPRESSION: CT head: No CT evidence of acute intracranial abnormality. Paranasal sinus disease. Cervical CT: No CT evidence of acute fracture or malalignment of the cervical spine. Carotid calcifications. Signed, Yvone NeuJaime S. Loreta AveWagner, DO [facility] Electronically Signed   By: Gilmer MorJaime  Wagner D.O.   On: 02/05/2016 11:34   Mr Brain Wo Contrast  Result Date: 02/05/2016 CLINICAL DATA:  Larey SeatFell last week. Persistent weakness in the  left hand. EXAM: MRI HEAD WITHOUT CONTRAST TECHNIQUE: Multiplanar, multiecho pulse sequences of the brain and surrounding structures were obtained without intravenous contrast. COMPARISON:  CT head without contrast 02/05/2016 FINDINGS: Brain: The diffusion-weighted images demonstrate no evidence for acute or subacute infarction. No acute hemorrhage or mass lesion is present. Moderate atrophy and white matter changes are present bilaterally. The ventricles are of normal size. The ventricles are proportionate to the degree of atrophy. No significant extraaxial fluid collection is present. Vascular: Flow is present in the major intracranial arteries. Skull and upper cervical spine: The skullbase is within normal limits. Midline sagittal structures are within normal limits. Sinuses/Orbits: A fluid level is present in the right maxillary sinus. There is mild mucosal thickening in the maxillary sinuses bilaterally. Mucosal thickening is present in the anterior ethmoid air cells is well. Minimal fluid is present in the mastoid air cells. No obstructing nasopharyngeal lesion is present. Other: IMPRESSION: 1. No acute or subacute infarct. 2. Moderate age advanced atrophy and white matter disease. This is nonspecific, but likely related to the chronic microvascular ischemia and alcohol abuse. 3. Mild sinus disease including a fluid level in the right maxillary sinus suggesting acute sinusitis. Electronically Signed   By: Marin Robertshristopher  Mattern M.D.   On: 02/05/2016 14:26    Procedures Procedures (including critical care time)  Medications Ordered in ED Medications - No data to display   Initial Impression / Assessment and Plan / ED Course  I have reviewed the triage vital signs and the nursing notes.  Pertinent labs & imaging results that were available during my care of the patient were reviewed by me and considered in my medical decision making (see chart for details).  Clinical Course   Patient presents with  left wrist weakness that started last week. Patient reports he fell due to becoming lightheaded while getting out of bed to use the restroom 8 days ago and notes he did not notice weakness in his left wrist until he woke up the following morning. Denies any other pain or complaints. Denies use of anticoagulants. History of cirrhosis, patient reports he is scheduled to see his PCP in 2 days to have labs done prior to his scheduled paracentesis in 3 days. VSS. Exam revealed left wrist drop, patient able to make a fist but weakness noted with left grip strength. Left upper extremity otherwise neurovascularly intact. No other neuro deficits on exam. Orders placed for labs and CT head and cervical spine.  Labs unremarkable. Sodium 129 which appears to be at patient's baseline values. CT head and neck negative.   Consulted neurology due to concern for possible stroke. Dr. Amada JupiterKirkpatrick advised to order MR brain to rule out stroke; if MRI  negative suspect radial nerve palsy and advised to place pt in splint and follow up with neurology outpatient.   MRI brain showed no acute infarct. Suspect pt's sxs are due to radial nerve palsy. Pt placed pt in wrist splint. Discussed results and plan for discharge with patient. Advised patient to continue wearing his wrist splint until he follows up with neurology outpatient. Advised patient to follow up with his PCP at his scheduled appointment in 2 days to have his labs rechecked prior to his scheduled paracentesis procedure. Discussed return precautions with patient.  Final Clinical Impressions(s) / ED Diagnoses   Final diagnoses:  Radial nerve palsy, left    New Prescriptions New Prescriptions   No medications on file     Barrett Henle, PA-C 02/05/16 1522    Benjiman Core, MD 02/05/16 1622

## 2016-02-05 NOTE — Discharge Instructions (Signed)
Wear your wrist splint daily until you follow up with the neurology clinic listed above.  Call the neurology clinic listed above in 2 days to schedule a follow up appointment.  Follow up with your primary care provider at your schedule appointment to have your labs performed. I recommend having your sodium rechecked as your sodium was 129 in the ED today. Please return to the Emergency Department if symptoms worsen or new onset of fever, headache, lightheadedness, dizziness, visual changes, chest pain, difficulty breathing, abdominal pain, vomiting, numbness, tingling, weakness.

## 2016-02-05 NOTE — ED Notes (Signed)
Pt returned from CT, placed back on monitor.

## 2016-02-05 NOTE — ED Triage Notes (Addendum)
Pt reports falling last week, hit left side and left arm/hand on ground. Pt reports difficulty straightening left wrist since last Thursday. Able to move left fingers, slight weakness in left hand grip compared to right hand grip. Denies numbness/tingling. Also c/o left rib cage pain. Hx cirrhosis, umbilical hernia. Normally ambulates w/ walker.

## 2016-02-07 ENCOUNTER — Ambulatory Visit: Payer: Medicaid Other | Attending: Family Medicine | Admitting: Family Medicine

## 2016-02-07 ENCOUNTER — Encounter: Payer: Self-pay | Admitting: Family Medicine

## 2016-02-07 VITALS — BP 109/73 | HR 75 | Temp 98.2°F | Resp 20 | Wt 165.0 lb

## 2016-02-07 DIAGNOSIS — Z23 Encounter for immunization: Secondary | ICD-10-CM | POA: Diagnosis not present

## 2016-02-07 DIAGNOSIS — M21332 Wrist drop, left wrist: Secondary | ICD-10-CM

## 2016-02-07 DIAGNOSIS — K7031 Alcoholic cirrhosis of liver with ascites: Secondary | ICD-10-CM | POA: Diagnosis not present

## 2016-02-07 DIAGNOSIS — K429 Umbilical hernia without obstruction or gangrene: Secondary | ICD-10-CM | POA: Diagnosis not present

## 2016-02-07 DIAGNOSIS — K420 Umbilical hernia with obstruction, without gangrene: Secondary | ICD-10-CM | POA: Insufficient documentation

## 2016-02-07 NOTE — Progress Notes (Signed)
Subjective:  Patient ID: [name], male    DOB: [birth year]  Age: 56 y.o. MRN: [id number]  CC: Cirrhosis   HPI [name] is a 55 year old male with a history of decompensated liver cirrhosis with ascites currently under the care of hospice. I had referred him for TIPS but he is yet to hear from interventional radiologist. Scheduled for repeat therapeutic paracentesis tomorrow.  Last week he took a fall after he blacked out and was evaluated at the emergency room; MRI was negative for an acute stroke. Since he had a fall he has had weakness of his left hand and this was treated as left wrist drop with a left wrist brace   Past Medical History:  Diagnosis Date  . Cirrhosis of liver (HCC)   . COPD (chronic obstructive pulmonary disease) (HCC)   . ETOH abuse     Past Surgical History:  Procedure Laterality Date  . NO PAST SURGERIES     jaw surgery 42yrs ago    No Known Allergies   Outpatient Medications Prior to Visit  Medication Sig Dispense Refill  . albuterol (PROVENTIL HFA;VENTOLIN HFA) 108 (90 Base) MCG/ACT inhaler Inhale 2 puffs into the lungs every 6 (six) hours as needed for wheezing or shortness of breath. 54 g 3  . citalopram (CELEXA) 20 MG tablet Take 1 tablet (20 mg total) by mouth daily. 30 tablet 3  . furosemide (LASIX) 20 MG tablet Take 1 tablet (20 mg total) by mouth daily. 30 tablet 3  . lactulose (CHRONULAC) 10 GM/15ML solution Take 15 mLs (10 g total) by mouth 2 (two) times daily. 946 mL 2  . potassium chloride SA (K-DUR,KLOR-CON) 20 MEQ tablet TAKE 1 TABLET BY MOUTH DAILY. 30 tablet 2  . ranitidine (ZANTAC) 150 MG tablet Take 1 tablet (150 mg total) by mouth 2 (two) times daily. 60 tablet 2  . spironolactone (ALDACTONE) 50 MG tablet Take 1 tablet (50 mg total) by mouth daily. 30 tablet 2  . diclofenac sodium (VOLTAREN) 1 % GEL Apply 4 g topically 4 (four) times daily. (Patient taking differently: Apply 4 g topically 4 (four) times daily as needed (for  pain). ) 100 g 1  . lactulose, encephalopathy, (CHRONULAC) 10 GM/15ML SOLN Take 15 mLs (10 g total) by mouth 2 (two) times daily as needed. (Patient taking differently: Take 10 g by mouth at bedtime. ) 2700 mL 2   No facility-administered medications prior to visit.     ROS Review of Systems Constitutional: Negative for fever, chills, diaphoresis, activity change, appetite change and fatigue. HENT: Negative for ear pain, nosebleeds, congestion, facial swelling, rhinorrhea, neck pain, neck stiffness and ear discharge.  Eyes: Negative for pain, discharge, redness, itching and visual disturbance. Respiratory: Negative for cough, choking, chest tightness, negative for shortness of breath, negative for wheezing and stridor.  Cardiovascular: Negative for chest pain, palpitations and positive for leg swelling. Gastrointestinal: Positive for abdominal distention and pain beneath rib cage Genitourinary: Negative for dysuria, urgency, frequency, hematuria, flank pain, decreased urine volume, difficulty urinating and dyspareunia.  Musculoskeletal: See history of present illness Hematologic: Positive for bruising Neurological: Negative for dizziness, tremors, seizures, syncope, facial asymmetry, speech difficulty, weakness, light-headedness, numbness and headaches. Psych: negative for Depression, negative for suicidal ideation  Objective:  BP 109/73 (BP Location: Left Arm, Patient Position: Sitting, Cuff Size: Normal)   Pulse 75   Temp 98.2 F (36.8 C) (Oral)   Resp 20   Wt 165 lb (74.8 kg)   SpO2 100%  BMI 21.77 kg/m   BP/Weight 02/07/2016 02/05/2016 02/01/2016  Systolic BP 109 112 122  Diastolic BP 73 82 80  Wt. (Lbs) 165 - -  BMI 21.77 - -  Some encounter information is confidential and restricted. Go to Review Flowsheets activity to see all data.      Physical Exam Constitutional: He is oriented to person, place, and time.  Chronically ill-looking  Cardiovascular: Normal rate, normal  heart sounds and intact distal pulses.   No murmur heard. Pulmonary/Chest: Effort normal and breath sounds normal. He has no wheezes. He has no rales. He exhibits no tenderness.  Abdominal: Soft. Bowel sounds are normal. He exhibits distension (mild ascites), reducible umbilical hernia. He exhibits no mass. There is no tenderness.  Musculoskeletal: Normal range of motion; left wrist in a brace. Reduced hand grip in the left, normal in the right. He exhibits edema (1+ bilateral nonpitting pedal edema).  Neurological: He is alert and oriented to person, place, and time.  CMP Latest Ref Rng & Units 02/05/2016 10/08/2015 09/22/2015  Glucose 65 - 99 mg/dL 91 409(W115(H) 119(J104(H)  BUN 6 - 20 mg/dL <4(N<5(L) 19 82(N33(H)  Creatinine 0.61 - 1.24 mg/dL 5.62(Z0.53(L) 3.080.90 6.570.91  Sodium 135 - 145 mmol/L 129(L) 136 127(L)  Potassium 3.5 - 5.1 mmol/L 4.1 3.9 4.3  Chloride 101 - 111 mmol/L 99(L) 107 100(L)  CO2 22 - 32 mmol/L 27 20 23   Calcium 8.9 - 10.3 mg/dL 8.3(L) 7.7(L) 8.2(L)  Total Protein 6.5 - 8.1 g/dL 8.4(O5.5(L) 5.3(L) -  Total Bilirubin 0.3 - 1.2 mg/dL 0.6 0.6 -  Alkaline Phos 38 - 126 U/L 89 72 -  AST 15 - 41 U/L 24 23 -  ALT 17 - 63 U/L 11(L) 13 -    Assessment & Plan:   1. Alcoholic cirrhosis of liver with ascites (HCC) - US Paracentesis; Future - US Paracentesis; Future - US Paracentesis; Future - US Paracentesis; Future  2. Umbilical hernia without obstruction and without gangrene I have explained to the patient that this is secondary to recurrent ascites. Once he has definitive management of his ascites will refer him to general surgery for surgical evaluation  3. Left wrist drop Continue using a wrist brace  4. Encounter for immunization - Flu Vaccine QUAD 36+ mos IM   No orders of the defined types were placed in this encounter.   Follow-up: Return in about 1 month (around 03/08/2016) for Follow-up on liver cirrhosis.   [name] MD

## 2016-02-07 NOTE — Telephone Encounter (Signed)
I had referred this patient to interventional radiology for a TIPS procedure for his alcoholic liver cirrhosis. Could you please follow-up on this? Thank you.

## 2016-02-07 NOTE — Progress Notes (Signed)
F/u liver Cirrhosis. Needs referral to radiology r/t fluid removal.

## 2016-02-07 NOTE — Patient Instructions (Addendum)
Paracentesis Appointments:  Arrive at 1:15 pm, Septemper 19th, 2017, September 26th, 2017, November 3rd, 2017, November 10th, 2017.   Cirrhosis Cirrhosis is long-term (chronic) liver injury. The liver is your largest internal organ, and it performs many functions. The liver converts food into energy, removes toxic material from your blood, makes important proteins, and absorbs necessary vitamins from your diet. If you have cirrhosis, it means many of your healthy liver cells have been replaced by scar tissue. This prevents blood from flowing through your liver, which makes it difficult for your liver to function. This scarring is not reversible, but treatment can prevent it from getting worse.  CAUSES  Hepatitis C and long-term alcohol abuse are the most common causes of cirrhosis. Other causes include:  Nonalcoholic fatty liver disease.  Hepatitis B infection.  Autoimmune hepatitis.  Diseases that cause blockage of ducts inside the liver.  Inherited liver diseases.  Reactions to certain long-term medicines.  Parasitic infections.  Long-term exposure to certain toxins. RISK FACTORS You may have a higher risk of cirrhosis if you:  Have certain hepatitis viruses.  Abuse alcohol, especially if you are male.  Are overweight.  Share needles.  Have unprotected sex with someone who has hepatitis. SYMPTOMS  You may not have any signs and symptoms at first. Symptoms may not develop until the damage to your liver starts to get worse. Signs and symptoms of cirrhosis may include:   Tenderness in the right-upper part of your abdomen.  Weakness and tiredness (fatigue).  Loss of appetite.  Nausea.  Weight loss and muscle loss.  Itchiness.  Yellow skin and eyes (jaundice).  Buildup of fluid in the abdomen (ascites).  Swelling of the feet and ankles (edema).  Appearance of tiny blood vessels under the skin.  Mental confusion.  Easy bruising and bleeding. DIAGNOSIS   Your health care provider may suspect cirrhosis based on your symptoms and medical history, especially if you have other medical conditions or a history of alcohol abuse. Your health care provider will do a physical exam to feel your liver and check for signs of cirrhosis. Your health care provider may perform other tests, including:   Blood tests to check:   Whether you have hepatitis B or C.   Kidney function.  Liver function.  Imaging tests such as:  MRI or CT scan to look for changes seen in advanced cirrhosis.  Ultrasound to see if normal liver tissue is being replaced by scar tissue.  A procedure using a long needle to take a sample of liver tissue (biopsy) for examination under a microscope. Liver biopsy can confirm the diagnosis of cirrhosis.  TREATMENT  Treatment depends on how damaged your liver is and what caused the damage. Treatment may include treating cirrhosis symptoms or treating the underlying causes of the condition to try to slow the progression of the damage. Treatment may include:  Making lifestyle changes, such as:   Eating a healthy diet.  Restricting salt intake.  Maintaining a healthy weight.   Not abusing drugs or alcohol.  Taking medicines to:  Treat liver infections or other infections.  Control itching.  Reduce fluid buildup.  Reduce certain blood toxins.  Reduce risk of bleeding from enlarged blood vessels in the stomach or esophagus (varices).  If varices are causing bleeding problems, you may need treatment with a procedure that ties up the vessels causing them to fall off (band ligation).  If cirrhosis is causing your liver to fail, your health care provider may recommend  a liver transplant.  Other treatments may be recommended depending on any complications of cirrhosis, such as liver-related kidney failure (hepatorenal syndrome). HOME CARE INSTRUCTIONS   Take medicines only as directed by your health care provider. Do not  use drugs that are toxic to your liver. Ask your health care provider before taking any new medicines, including over-the-counter medicines.   Rest as needed.  Eat a well-balanced diet. Ask your health care provider or dietitian for more information.   You may have to follow a low-salt diet or restrict your water intake as directed.  Do not drink alcohol. This is especially important if you are taking acetaminophen.  Keep all follow-up visits as directed by your health care provider. This is important. SEEK MEDICAL CARE IF:  You have fatigue or weakness that is getting worse.  You develop swelling of the hands, feet, legs, or face.  You have a fever.  You develop loss of appetite.  You have nausea or vomiting.  You develop jaundice.  You develop easy bruising or bleeding. SEEK IMMEDIATE MEDICAL CARE IF:  You vomit bright red blood or a material that looks like coffee grounds.  You have blood in your stools.  Your stools appear black and tarry.  You become confused.  You have chest pain or trouble breathing.   This information is not intended to replace advice given to you by your health care provider. Make sure you discuss any questions you have with your health care provider.   Document Released: 05/15/2005 Document Revised: 06/05/2014 Document Reviewed: 01/21/2014 Elsevier Interactive Patient Education [employer]

## 2016-02-08 ENCOUNTER — Ambulatory Visit (HOSPITAL_COMMUNITY)
Admission: RE | Admit: 2016-02-08 | Discharge: 2016-02-08 | Disposition: A | Discharge status: Home / Self Care | Payer: Medicaid Other | Source: Ambulatory Visit | Attending: Family Medicine | Admitting: Family Medicine

## 2016-02-08 DIAGNOSIS — K7031 Alcoholic cirrhosis of liver with ascites: Secondary | ICD-10-CM | POA: Diagnosis present

## 2016-02-08 MED ORDER — LIDOCAINE HCL 1 % IJ SOLN
INTRAMUSCULAR | Status: AC
  Filled 2016-02-08: qty 20

## 2016-02-08 MED ORDER — ALBUMIN HUMAN 25 % IV SOLN
25.0000 g | Freq: Once | INTRAVENOUS | Status: AC
  Administered 2016-02-08: 25 g via INTRAVENOUS
  Filled 2016-02-08: qty 100

## 2016-02-08 NOTE — Procedures (Signed)
Successful US guided paracentesis from RLQ.  Yielded 4.2L of clear yellow fluid.  No immediate complications.  Pt tolerated well.   Specimen was not sent for labs.  Brayton ElBRUNING, Abeeha Twist PA-C 02/08/2016 12:33 PM

## 2016-02-09 NOTE — Telephone Encounter (Signed)
Called patient and LVM regarding TIPS procedure.  Then called Tonya, RN hospice who states that patient has not had any other procedures except for the paracentesis. Writer does not see referral for TIPS procedure.

## 2016-02-14 ENCOUNTER — Other Ambulatory Visit: Payer: Self-pay | Admitting: Family Medicine

## 2016-02-14 DIAGNOSIS — K7031 Alcoholic cirrhosis of liver with ascites: Secondary | ICD-10-CM

## 2016-02-14 NOTE — Telephone Encounter (Signed)
I had referred this patient with Ascites to IR for TIPS procedure on 11/03/15 and 01/18/16. Could you please follow up with them regarding this referral? Thank you.

## 2016-02-14 NOTE — Telephone Encounter (Signed)
Good Morning Dr Venetia NightAmao  I spoke to [place] from IR she said that is weird that a doctor haven't review it but she is going to give it to a Dr and after that will call the patient to schedule .

## 2016-02-15 ENCOUNTER — Ambulatory Visit (HOSPITAL_COMMUNITY)
Admission: RE | Admit: 2016-02-15 | Discharge: 2016-02-15 | Disposition: A | Discharge status: Home / Self Care | Payer: Medicaid Other | Source: Ambulatory Visit | Attending: Family Medicine | Admitting: Family Medicine

## 2016-02-15 DIAGNOSIS — K7031 Alcoholic cirrhosis of liver with ascites: Secondary | ICD-10-CM | POA: Diagnosis not present

## 2016-02-15 MED ORDER — LIDOCAINE HCL (PF) 1 % IJ SOLN
INTRAMUSCULAR | Status: AC
  Filled 2016-02-15: qty 10

## 2016-02-15 MED ORDER — ALBUMIN HUMAN 25 % IV SOLN
25.0000 g | Freq: Once | INTRAVENOUS | Status: AC
  Administered 2016-02-15: 25 g via INTRAVENOUS
  Filled 2016-02-15: qty 100

## 2016-02-15 MED FILL — raNITIdine HCL 150 MG TABS: 150 | 30 days supply | Qty: 60 | Fill #2

## 2016-02-15 MED FILL — POTASSIUM CL ER 20 MEQ TAB: 20 | 30 days supply | Qty: 30 | Fill #0

## 2016-02-15 MED FILL — SPIRONOLACTONE 50 MG TABLET: 50 | 30 days supply | Qty: 30 | Fill #1

## 2016-02-15 NOTE — Procedures (Signed)
   US guided RLQ paracentesis 4 liters yellow fluid Max per MD  Post procedure 25 gr Albumin  Pt tolerated well

## 2016-02-22 ENCOUNTER — Ambulatory Visit (HOSPITAL_COMMUNITY)
Admission: RE | Admit: 2016-02-22 | Discharge: 2016-02-22 | Disposition: A | Discharge status: Home / Self Care | Payer: Medicaid Other | Source: Ambulatory Visit | Attending: Family Medicine | Admitting: Family Medicine

## 2016-02-22 DIAGNOSIS — K7031 Alcoholic cirrhosis of liver with ascites: Secondary | ICD-10-CM | POA: Insufficient documentation

## 2016-02-22 MED ORDER — ALBUMIN HUMAN 25 % IV SOLN
25.0000 g | Freq: Once | INTRAVENOUS | Status: AC
  Administered 2016-02-22: 25 g via INTRAVENOUS
  Filled 2016-02-22: qty 100

## 2016-02-22 MED ORDER — LIDOCAINE HCL 1 % IJ SOLN
INTRAMUSCULAR | Status: AC
  Filled 2016-02-22: qty 20

## 2016-02-22 MED FILL — PROAIR HFA 90 MCG INHALER: 108 (90 BAS | 25 days supply | Qty: 9 | Fill #0

## 2016-02-22 NOTE — Procedures (Signed)
Successful US guided paracentesis from right lateral abdomen.  Yielded 4 liters of clear yellow fluid.  No immediate complications.  Pt tolerated well.   Yogesh Cominsky S Janari Gagner PA-C 02/22/2016 3:19 PM

## 2016-02-29 ENCOUNTER — Ambulatory Visit (HOSPITAL_COMMUNITY)
Admission: RE | Admit: 2016-02-29 | Discharge: 2016-02-29 | Disposition: A | Discharge status: Home / Self Care | Payer: Medicaid Other | Source: Ambulatory Visit | Attending: Family Medicine | Admitting: Family Medicine

## 2016-02-29 ENCOUNTER — Telehealth: Payer: Self-pay

## 2016-02-29 DIAGNOSIS — K7031 Alcoholic cirrhosis of liver with ascites: Secondary | ICD-10-CM | POA: Insufficient documentation

## 2016-02-29 MED ORDER — LIDOCAINE HCL 1 % IJ SOLN
INTRAMUSCULAR | Status: AC
  Filled 2016-02-29: qty 20

## 2016-02-29 MED ORDER — ALBUMIN HUMAN 25 % IV SOLN
25.0000 g | Freq: Once | INTRAVENOUS | Status: AC
  Administered 2016-02-29: 25 g via INTRAVENOUS
  Filled 2016-02-29: qty 100

## 2016-02-29 NOTE — Telephone Encounter (Signed)
Tresa EndoKelly, PA from US called stating that patient's bladder is very distended.  Dr. Venetia NightAmao ask that  patient have an I&O catherization.  Patient went to short stay for his albumin infusion, Belenda CruiseKristin, RN called stating that patient was catheterized putting out 1,700 cc's. Dr. Venetia NightAmao would like patient to go to the ED to be evaluated.  Patient does not want to go, he states that he just wants to go home.  Patient to see Dr. Venetia NightAmao on 03/06/16.  Writer called Kenney Housemananya, RN- [facility] who will make sure to see patient tomorrow  and then update Dr. Venetia NightAmao.  Kenney Housemananya is aware that patient may need and I & O cath or possibly an indwelling cath.

## 2016-02-29 NOTE — Procedures (Signed)
Ultrasound-guided therapeutic paracentesis performed yielding 4.1 liters of yellow colored fluid. No immediate complications.  Patient also noted to have a very distended bladder on US.  PCP was notified and she requested I&O cath to be performed [facility].  Veria Stradley E 2:55 PM 02/29/2016

## 2016-02-29 NOTE — Progress Notes (Addendum)
In and out catherized patient per MD order.  Output noted to be 1700cc.  Called Dr Felipa Furnaceenobong's office and reported amount of output from in and out cath to QUALCOMMKay RN.  Joyce GrossKay stated to tell the patient they want him to go to the ER today for a full work up, that with all his issues they are concerned that something is going on with his kidneys and that is very dangerous and they do not have the equipment to do a full work up at the office.  I relayed that to Mr Burnadette Peterlynch and he stated he felt fine and was not going to go to the ER today.  I told Joyce GrossKay RN what Mr Burnadette PeterLynch said and she stated that they would get in touch with his hospice nurse and have her in and out cath him at home to keep an eye out on this problem.  I relayed that to Mr Burnadette PeterLynch and he stated that was fine and he left to catch his ride home.

## 2016-03-01 ENCOUNTER — Other Ambulatory Visit: Payer: Self-pay | Admitting: Family Medicine

## 2016-03-01 ENCOUNTER — Telehealth: Payer: Self-pay

## 2016-03-01 DIAGNOSIS — K7031 Alcoholic cirrhosis of liver with ascites: Secondary | ICD-10-CM

## 2016-03-01 NOTE — Telephone Encounter (Signed)
Bryce Housemananya, RN called from [facility] while she was visiting the patient today.  Patient would not allow her to cath him today neither I & O cath or indwelling.  He told her that he had urinated an hour prior to her coming.  RN states that his bladder did not seem distended on physical examination. RN was interested in knowing whether Dr. Venetia NightAmao was aware that patient was scheduled for a TIPS Procedure and if he were to go through with it patient would be taken off of hospice. [name] is requesting a call back regarding this matter.

## 2016-03-02 ENCOUNTER — Other Ambulatory Visit: Payer: Self-pay

## 2016-03-02 NOTE — Telephone Encounter (Signed)
It is okay for him to proceed with the TIPS procedure which I referred him for. Hospice can follow their protocol if they feel he does not qualify for hospice after the procedure.

## 2016-03-02 NOTE — Telephone Encounter (Signed)
Writer spoke with Kenney Housemananya, RN from [facility] per Dr. Venetia NightAmao and relayed msg that MD placed an order for the TIPS procedure and if protocol does not allow him to stay with hospice Dr. Venetia NightAmao feels that this procedure would be to the patient's benefit. Kenney Housemananya stated understanding and will keep MD updated on the decision with the procedure as well as the urinary retention issue.

## 2016-03-06 ENCOUNTER — Ambulatory Visit: Payer: Self-pay | Admitting: Family Medicine

## 2016-03-07 ENCOUNTER — Ambulatory Visit (HOSPITAL_COMMUNITY)
Admission: RE | Admit: 2016-03-07 | Discharge: 2016-03-07 | Disposition: A | Discharge status: Home / Self Care | Payer: Medicaid Other | Source: Ambulatory Visit | Attending: Family Medicine | Admitting: Family Medicine

## 2016-03-07 DIAGNOSIS — R188 Other ascites: Secondary | ICD-10-CM | POA: Insufficient documentation

## 2016-03-07 DIAGNOSIS — K7031 Alcoholic cirrhosis of liver with ascites: Secondary | ICD-10-CM | POA: Insufficient documentation

## 2016-03-07 MED ORDER — ALBUMIN HUMAN 25 % IV SOLN
25.0000 g | Freq: Once | INTRAVENOUS | Status: AC
  Administered 2016-03-07: 25 g via INTRAVENOUS
  Filled 2016-03-07: qty 100

## 2016-03-07 MED ORDER — LIDOCAINE HCL 1 % IJ SOLN
INTRAMUSCULAR | Status: AC
  Filled 2016-03-07: qty 20

## 2016-03-07 NOTE — Procedures (Signed)
   US guided RLQ paracentesis 4 L maximum collected per MD Amber color  Tolerated well

## 2016-03-10 ENCOUNTER — Ambulatory Visit: Payer: Medicaid Other | Attending: Family Medicine | Admitting: Family Medicine

## 2016-03-10 ENCOUNTER — Encounter: Payer: Self-pay | Admitting: Family Medicine

## 2016-03-10 DIAGNOSIS — F325 Major depressive disorder, single episode, in full remission: Secondary | ICD-10-CM

## 2016-03-10 DIAGNOSIS — K7031 Alcoholic cirrhosis of liver with ascites: Secondary | ICD-10-CM | POA: Diagnosis not present

## 2016-03-10 DIAGNOSIS — J449 Chronic obstructive pulmonary disease, unspecified: Secondary | ICD-10-CM | POA: Insufficient documentation

## 2016-03-10 DIAGNOSIS — E876 Hypokalemia: Secondary | ICD-10-CM | POA: Diagnosis not present

## 2016-03-10 DIAGNOSIS — F329 Major depressive disorder, single episode, unspecified: Secondary | ICD-10-CM | POA: Insufficient documentation

## 2016-03-10 MED ORDER — CITALOPRAM HYDROBROMIDE 20 MG PO TABS: 20.0000 mg | ORAL_TABLET | Freq: Every day | ORAL | 3 refills | Status: DC

## 2016-03-10 MED ORDER — POTASSIUM CHLORIDE CRYS ER 20 MEQ PO TBCR: 20.0000 meq | EXTENDED_RELEASE_TABLET | Freq: Every day | ORAL | 2 refills | Status: DC

## 2016-03-10 MED ORDER — SPIRONOLACTONE 50 MG PO TABS: 50.0000 mg | ORAL_TABLET | Freq: Every day | ORAL | 2 refills | Status: DC

## 2016-03-10 NOTE — Progress Notes (Signed)
Subjective:  Patient ID: [name], male    DOB: 09-Jan-1961  Age: 55 y.o. MRN: [id number]  CC: liver cirrhosis (drained tuesday 03/04/16)   HPI [name]  is a 55 year old male with a history of decompensated liver cirrhosis with ascites currently who has been undergoing therapeutic paracentesis and is under the care of hospice. I had referred him for TIPS and he has an upcoming appointment with interventional radiology on 03/15/16.  Last therapeutic paracentesis was two days ago.  2 weeks ago he did have an episode where he was found to have a distended bladder which was relieved by means of Foley catheter short stay but the patient had refused presenting to the ED. Today he denies lower urinary tract symptoms and informs me that he voids without difficulty however he promises to inform me should any of this change.  Past Medical History:  Diagnosis Date  . Cirrhosis of liver (HCC)   . COPD (chronic obstructive pulmonary disease) (HCC)   . ETOH abuse     Past Surgical History:  Procedure Laterality Date  . NO PAST SURGERIES     jaw surgery 4748yrs ago    Outpatient Medications Prior to Visit  Medication Sig Dispense Refill  . albuterol (PROVENTIL HFA;VENTOLIN HFA) 108 (90 Base) MCG/ACT inhaler Inhale 2 puffs into the lungs every 6 (six) hours as needed for wheezing or shortness of breath. 54 g 3  . diclofenac sodium (VOLTAREN) 1 % GEL Apply 4 g topically 4 (four) times daily. (Patient taking differently: Apply 4 g topically 4 (four) times daily as needed (for pain). ) 100 g 1  . furosemide (LASIX) 20 MG tablet TAKE 1 TABLET BY MOUTH DAILY. 30 tablet 3  . lactulose (CHRONULAC) 10 GM/15ML solution Take 15 mLs (10 g total) by mouth 2 (two) times daily. 946 mL 2  . ranitidine (ZANTAC) 150 MG tablet Take 1 tablet (150 mg total) by mouth 2 (two) times daily. 60 tablet 2  . citalopram (CELEXA) 20 MG tablet Take 1 tablet (20 mg total) by mouth daily. 30 tablet 3  . potassium chloride  SA (K-DUR,KLOR-CON) 20 MEQ tablet TAKE 1 TABLET BY MOUTH DAILY. 30 tablet 2  . spironolactone (ALDACTONE) 50 MG tablet Take 1 tablet (50 mg total) by mouth daily. 30 tablet 2  . lactulose, encephalopathy, (CHRONULAC) 10 GM/15ML SOLN Take 15 mLs (10 g total) by mouth 2 (two) times daily as needed. (Patient taking differently: Take 10 g by mouth at bedtime. ) 2700 mL 2   No facility-administered medications prior to visit.     ROS Review of Systems Constitutional: Negative for fever, chills, diaphoresis, activity change, appetite change and fatigue. HENT: Negative for ear pain, nosebleeds, congestion, facial swelling, rhinorrhea, neck pain, neck stiffness and ear discharge.  Eyes: Negative for pain, discharge, redness, itching and visual disturbance. Respiratory: Negative for cough, choking, chest tightness, negative for shortness of breath, negative for wheezing and stridor.  Cardiovascular: Negative for chest pain, palpitations and positive for leg swelling. Gastrointestinal: Positive for abdominal distention and pain beneath rib cage Genitourinary: Negative for dysuria, urgency, frequency, hematuria, flank pain, decreased urine volume, difficulty urinating and dyspareunia.  Musculoskeletal: See history of present illness Hematologic: Positive for bruising Neurological: Negative for dizziness, tremors, seizures, syncope, facial asymmetry, speech difficulty, weakness, light-headedness, numbness and headaches. Psych: negative for Depression, negative for suicidal ideation  Objective:  BP 107/75 (BP Location: Right Arm, Patient Position: Sitting, Cuff Size: Large)   Pulse 78   Temp 97.3  F (36.3 C) (Oral)   Ht 6\' 1"  (1.854 m)   Wt 158 lb 9.6 oz (71.9 kg)   SpO2 96%   BMI 20.92 kg/m   BP/Weight 03/10/2016 03/07/2016 02/29/2016  Systolic BP 107 104 108  Diastolic BP 75 67 65  Wt. (Lbs) 158.6 - -  BMI 20.92 - -  Some encounter information is confidential and restricted. Go to Review  Flowsheets activity to see all data.      Physical Exam Constitutional: He is oriented to person, place, and time.  Chronically ill-looking  Cardiovascular: Normal rate, normal heart sounds and intact distal pulses.   No murmur heard. Pulmonary/Chest: Effort normal and breath sounds normal. He has no wheezes. He has no rales. He exhibits no tenderness.  Abdominal: Soft. Bowel sounds are normal. He exhibits distension (mild ascites), reducible umbilical hernia. He exhibits no mass. There is no tenderness.  Musculoskeletal: Normal range of motion; left wrist in a brace. Reduced hand grip in the left, normal in the right. He exhibits edema (1+ bilateral nonpitting pedal edema).  Neurological: He is alert and oriented to person, place, and time.  Assessment & Plan:   1. Major depressive disorder with single episode, in full remission (HCC) Controlled - citalopram (CELEXA) 20 MG tablet; Take 1 tablet (20 mg total) by mouth daily.  Dispense: 30 tablet; Refill: 3  2. Alcoholic cirrhosis of liver with ascites (HCC) Scheduled to see interventional radiology on 03/15/16 for evaluation for TIPS procedure - spironolactone (ALDACTONE) 50 MG tablet; Take 1 tablet (50 mg total) by mouth daily.  Dispense: 30 tablet; Refill: 2 - US Paracentesis; Future - US Paracentesis; Future  3. Hypokalemia - potassium chloride SA (K-DUR,KLOR-CON) 20 MEQ tablet; Take 1 tablet (20 mEq total) by mouth daily.  Dispense: 30 tablet; Refill: 2   Meds ordered this encounter  Medications  . citalopram (CELEXA) 20 MG tablet    Sig: Take 1 tablet (20 mg total) by mouth daily.    Dispense:  30 tablet    Refill:  3  . spironolactone (ALDACTONE) 50 MG tablet    Sig: Take 1 tablet (50 mg total) by mouth daily.    Dispense:  30 tablet    Refill:  2  . potassium chloride SA (K-DUR,KLOR-CON) 20 MEQ tablet    Sig: Take 1 tablet (20 mEq total) by mouth daily.    Dispense:  30 tablet    Refill:  2    Follow-up: Return  in about 2 weeks (around 03/24/2016) for For follow-up on liver cirrhosis.   [name] MD

## 2016-03-10 NOTE — Patient Instructions (Signed)
Cirrhosis °Cirrhosis is long-term (chronic) liver injury. The liver is your largest internal organ, and it performs many functions. The liver converts food into energy, removes toxic material from your blood, makes important proteins, and absorbs necessary vitamins from your diet. °If you have cirrhosis, it means many of your healthy liver cells have been replaced by scar tissue. This prevents blood from flowing through your liver, which makes it difficult for your liver to function. This scarring is not reversible, but treatment can prevent it from getting worse.  °CAUSES  °Hepatitis C and long-term alcohol abuse are the most common causes of cirrhosis. Other causes include: °· Nonalcoholic fatty liver disease. °· Hepatitis B infection. °· Autoimmune hepatitis. °· Diseases that cause blockage of ducts inside the liver. °· Inherited liver diseases. °· Reactions to certain long-term medicines. °· Parasitic infections. °· Long-term exposure to certain toxins. °RISK FACTORS °You may have a higher risk of cirrhosis if you: °· Have certain hepatitis viruses. °· Abuse alcohol, especially if you are male. °· Are overweight. °· Share needles. °· Have unprotected sex with someone who has hepatitis. °SYMPTOMS  °You may not have any signs and symptoms at first. Symptoms may not develop until the damage to your liver starts to get worse. Signs and symptoms of cirrhosis may include:  °· Tenderness in the right-upper part of your abdomen. °· Weakness and tiredness (fatigue). °· Loss of appetite. °· Nausea. °· Weight loss and muscle loss. °· Itchiness. °· Yellow skin and eyes (jaundice). °· Buildup of fluid in the abdomen (ascites). °· Swelling of the feet and ankles (edema). °· Appearance of tiny blood vessels under the skin. °· Mental confusion. °· Easy bruising and bleeding. °DIAGNOSIS  °Your health care provider may suspect cirrhosis based on your symptoms and medical history, especially if you have other medical conditions  or a history of alcohol abuse. Your health care provider will do a physical exam to feel your liver and check for signs of cirrhosis. Your health care provider may perform other tests, including:  °· Blood tests to check:   °¨ Whether you have hepatitis B or C.   °¨ Kidney function. °¨ Liver function. °· Imaging tests such as: °¨ MRI or CT scan to look for changes seen in advanced cirrhosis. °¨ Ultrasound to see if normal liver tissue is being replaced by scar tissue. °· A procedure using a long needle to take a sample of liver tissue (biopsy) for examination under a microscope. Liver biopsy can confirm the diagnosis of cirrhosis.   °TREATMENT  °Treatment depends on how damaged your liver is and what caused the damage. Treatment may include treating cirrhosis symptoms or treating the underlying causes of the condition to try to slow the progression of the damage. Treatment may include: °· Making lifestyle changes, such as:   °¨ Eating a healthy diet. °¨ Restricting salt intake.  °¨ Maintaining a healthy weight.   °¨ Not abusing drugs or alcohol. °· Taking medicines to: °¨ Treat liver infections or other infections. °¨ Control itching. °¨ Reduce fluid buildup. °¨ Reduce certain blood toxins. °¨ Reduce risk of bleeding from enlarged blood vessels in the stomach or esophagus (varices). °· If varices are causing bleeding problems, you may need treatment with a procedure that ties up the vessels causing them to fall off (band ligation). °· If cirrhosis is causing your liver to fail, your health care provider may recommend a liver transplant. °· Other treatments may be recommended depending on any complications of cirrhosis, such as liver-related kidney failure (hepatorenal   syndrome). °HOME CARE INSTRUCTIONS  °· Take medicines only as directed by your health care provider. Do not use drugs that are toxic to your liver. Ask your health care provider before taking any new medicines, including over-the-counter medicines.    °· Rest as needed. °· Eat a well-balanced diet. Ask your health care provider or dietitian for more information.   °· You may have to follow a low-salt diet or restrict your water intake as directed. °· Do not drink alcohol. This is especially important if you are taking acetaminophen. °· Keep all follow-up visits as directed by your health care provider. This is important. °SEEK MEDICAL CARE IF: °· You have fatigue or weakness that is getting worse. °· You develop swelling of the hands, feet, legs, or face. °· You have a fever. °· You develop loss of appetite. °· You have nausea or vomiting. °· You develop jaundice. °· You develop easy bruising or bleeding. °SEEK IMMEDIATE MEDICAL CARE IF: °· You vomit bright red blood or a material that looks like coffee grounds. °· You have blood in your stools. °· Your stools appear black and tarry. °· You become confused. °· You have chest pain or trouble breathing. °  °This information is not intended to replace advice given to you by your health care provider. Make sure you discuss any questions you have with your health care provider. °  °Document Released: 05/15/2005 Document Revised: 06/05/2014 Document Reviewed: 01/21/2014 °Elsevier Interactive Patient Education ©2016 [employer] ° °

## 2016-03-13 ENCOUNTER — Ambulatory Visit (HOSPITAL_COMMUNITY)
Admission: RE | Admit: 2016-03-13 | Discharge: 2016-03-13 | Disposition: A | Discharge status: Home / Self Care | Payer: Medicaid Other | Source: Ambulatory Visit | Attending: Family Medicine | Admitting: Family Medicine

## 2016-03-13 DIAGNOSIS — K7031 Alcoholic cirrhosis of liver with ascites: Secondary | ICD-10-CM | POA: Diagnosis not present

## 2016-03-13 MED ORDER — LIDOCAINE HCL (PF) 1 % IJ SOLN
INTRAMUSCULAR | Status: AC
  Filled 2016-03-13: qty 10

## 2016-03-13 MED ORDER — ALBUMIN HUMAN 25 % IV SOLN
25.0000 g | Freq: Once | INTRAVENOUS | Status: AC
  Administered 2016-03-13: 25 g via INTRAVENOUS
  Filled 2016-03-13: qty 100

## 2016-03-13 NOTE — Procedures (Signed)
Ultrasound-guided therapeutic paracentesis performed yielding 4  liters of yellow colored fluid. No immediate complications.  [name] E 11:14 AM 03/13/2016

## 2016-03-15 ENCOUNTER — Ambulatory Visit
Admission: RE | Admit: 2016-03-15 | Discharge: 2016-03-15 | Disposition: A | Discharge status: Home / Self Care | Payer: Medicaid Other | Source: Ambulatory Visit | Attending: Family Medicine | Admitting: Family Medicine

## 2016-03-15 ENCOUNTER — Other Ambulatory Visit: Payer: Self-pay | Admitting: Interventional Radiology

## 2016-03-15 DIAGNOSIS — K7031 Alcoholic cirrhosis of liver with ascites: Secondary | ICD-10-CM

## 2016-03-15 HISTORY — PX: IR GENERIC HISTORICAL: IMG1180011

## 2016-03-15 NOTE — Consult Note (Addendum)
Chief Complaint: Patient was seen in consultation today for  Chief Complaint  Patient presents with  . Advice Only    Consult for TIPS   at the request of [name],[name]  Referring Physician(s): [name],[name]  History of Present Illness: [name] is a 55 y.o. male known to our service from multiple ultrasound-guided paracentesis procedures. He has a history of ethanol-induced cirrhosis and COPD. He's never had any GI bleeding. His recurrent large volume ascites has necessitated nearly weekly ultrasound-guided paracentesis procedures; he's had approximately 40   in this calendar year thus far. Typically 4-8 liters are removed at each setting. He feels good effort for a few days after each procedure but then is the fluid re-accumulates has abdominal discomfort and low energy. The ascites also results in pronounced protrusion of his umbilical hernia. He  presents for consultation regarding options.   Past Medical History:  Diagnosis Date  . Cirrhosis of liver (HCC)   . COPD (chronic obstructive pulmonary disease) (HCC)   . ETOH abuse     Past Surgical History:  Procedure Laterality Date  . NO PAST SURGERIES     jaw surgery 49yrs ago   tolerated general anesthetic without complication   Allergies: Review of patient's allergies indicates no known allergies. No allergy to latex or iodinated contrast.  Medications: Prior to Admission medications   Medication Sig Start Date End Date Taking? Authorizing Provider  albuterol (PROVENTIL HFA;VENTOLIN HFA) 108 (90 Base) MCG/ACT inhaler Inhale 2 puffs into the lungs every 6 (six) hours as needed for wheezing or shortness of breath. 12/02/15  Yes [name], MD  citalopram (CELEXA) 20 MG tablet Take 1 tablet (20 mg total) by mouth daily. 03/10/16  Yes [name], MD  diclofenac sodium (VOLTAREN) 1 % GEL Apply 4 g topically 4 (four) times daily. Patient taking differently: Apply 4 g topically 4 (four) times daily as needed (for pain).   10/21/15  Yes [name], MD  furosemide (LASIX) 20 MG tablet TAKE 1 TABLET BY MOUTH DAILY. 03/02/16  Yes [name], MD  lactulose (CHRONULAC) 10 GM/15ML solution Take 15 mLs (10 g total) by mouth 2 (two) times daily. 07/22/15  Yes [name], MD  potassium chloride SA (K-DUR,KLOR-CON) 20 MEQ tablet Take 1 tablet (20 mEq total) by mouth daily. 03/10/16  Yes [name], MD  ranitidine (ZANTAC) 150 MG tablet Take 1 tablet (150 mg total) by mouth 2 (two) times daily. 11/22/15  Yes [name], MD  spironolactone (ALDACTONE) 50 MG tablet Take 1 tablet (50 mg total) by mouth daily. 03/10/16  Yes [name], MD     Family History  Problem Relation Age of Onset  . Diabetes Mother     Social History   Social History  . Marital status: Single    Spouse name: N/A  . Number of children: N/A  . Years of education: N/A   Social History Main Topics  . Smoking status: Current Every Day Smoker    Packs/day: 0.25    Years: 40.00    Types: Cigarettes  . Smokeless tobacco: Never Used  . Alcohol use No     Comment: 05/28/15-states still not drinking  . Drug use: No  . Sexual activity: No   Other Topics Concern  . Not on file   Social History Narrative  . No narrative on file  Patient is divorced, 2 daughters, 2 siblings, lives in [place]. Positive tobacco and caffeine use.  ECOG Status: 2 - Symptomatic, <50% confined to bed  Review of Systems: A 12 point ROS discussed and pertinent positives are indicated in the HPI above.  All other systems are negative. Positive for nausea and vomiting, balance difficulties, neck and back pain, joint pain, depression and anxiety. Review of Systems  Vital Signs: BP 102/71 (BP Location: Left Arm, Patient Position: Sitting, Cuff Size: Normal)   Pulse 86   Temp 98.3 F (36.8 C) (Oral)   Resp 15   Ht 6\' 1"  (1.854 m)   Wt 161 lb (73 kg)   SpO2 98%   BMI 21.24 kg/m   Physical Exam Easily reducible protuberant umbilical hernia. Mallampati  Score:     Imaging: EXAM: CT ABDOMEN AND PELVIS WITH CONTRAST  TECHNIQUE: Multidetector CT imaging of the abdomen and pelvis was performed using the standard protocol following bolus administration of intravenous contrast.  CONTRAST:  OMNIPAQUE IOHEXOL 300 MG/ML SOLN, 25mL OMNIPAQUE IOHEXOL 300 MG/ML SOLN  COMPARISON:  None.  FINDINGS: Lung bases are clear.  No effusions.  Heart is normal size.  Heterogeneous appearance of the liver with nodular contours. Findings compatible with cirrhosis. There is associated moderate ascites in the abdomen and pelvis.  There are gallstones within the gallbladder. Gallbladder is distended. No biliary ductal dilatation.  Spleen, pancreas, adrenals are unremarkable. Small cysts in the kidneys bilaterally. No hydronephrosis.  Aorta and iliac vessels are calcified, non aneurysmal.  Urinary bladder is distended. Stomach and small bowel are decompressed and unremarkable. Large bowel decompressed, grossly unremarkable. Large bowel decompressed, grossly unremarkable. No adenopathy.  No acute bony abnormality or focal bone lesion.  IMPRESSION: Changes of cirrhosis with associated moderate ascites.  Layering gallstones within the gallbladder. Gallbladder is moderately distended.  No evidence of diverticulosis or diverticulitis.   Electronically Signed   By: Charlett Nose M.D.   On: 03/12/2015 14:44  US Paracentesis  Result Date: 03/13/2016 INDICATION: Current ascites secondary to alcoholic cirrhosis. Request is made for therapeutic paracentesis with a limit of 4 L and albumin postprocedure. EXAM: ULTRASOUND GUIDED THERAPEUTIC PARACENTESIS MEDICATIONS: 1% lidocaine COMPLICATIONS: None immediate. PROCEDURE: Informed written consent was obtained from the patient after a discussion of the risks, benefits and alternatives to treatment. A timeout was performed prior to the initiation of the procedure. Initial ultrasound  scanning demonstrates a large amount of ascites within the right lower abdominal quadrant. The right lower abdomen was prepped and draped in the usual sterile fashion. 1% lidocaine was used for local anesthesia. Following this, a 19 gauge, 7-cm, Yueh catheter was introduced. An ultrasound image was saved for documentation purposes. The paracentesis was performed. The catheter was removed and a dressing was applied. The patient tolerated the procedure well without immediate post procedural complication. FINDINGS: A total of approximately 4 L of yellow fluid was removed. IMPRESSION: Successful ultrasound-guided paracentesis yielding 4 liters of peritoneal fluid. Read by: Barnetta Chapel, PA-C Electronically Signed   By: Irish Lack M.D.   On: 03/13/2016 11:16   US Paracentesis  Result Date: 03/07/2016 INDICATION: Recurrent ascites EXAM: ULTRASOUND-GUIDED PARACENTESIS COMPARISON:  Previous paracentesis MEDICATIONS: 10 cc 1% lidocaine COMPLICATIONS: None immediate. TECHNIQUE: Informed written consent was obtained from the patient after a discussion of the risks, benefits and alternatives to treatment. A timeout was performed prior to the initiation of the procedure. Initial ultrasound scanning demonstrates a large amount of ascites within the right lower abdominal quadrant. The right lower abdomen was prepped and draped in the usual sterile fashion. 1% lidocaine with epinephrine was used for local anesthesia. Under direct ultrasound guidance, a  19 gauge, 7-cm, Yueh catheter was introduced. An ultrasound image was saved for documentation purposed. The paracentesis was performed. The catheter was removed and a dressing was applied. The patient tolerated the procedure well without immediate post procedural complication. FINDINGS: A total of approximately 4 liters of amber fluid was removed. 4 Liter max per MD. Distended bladder again noted on limited US Abdomen; pt is asymptomatic IMPRESSION: Successful  ultrasound-guided paracentesis yielding 4 liters of peritoneal fluid. Read by Robet LeuPamela A Turpin [facility] Electronically Signed   By: Corlis Leak  Marris Frontera M.D.   On: 03/07/2016 15:10   [place] Paracentesis  Result Date: 02/29/2016 INDICATION: History of alcoholic cirrhosis with recurrent ascites. Request is made for therapeutic paracentesis. Maximum is given of 4 L. EXAM: ULTRASOUND GUIDED THERAPEUTIC PARACENTESIS MEDICATIONS: 1% lidocaine COMPLICATIONS: None immediate. PROCEDURE: Informed written consent was obtained from the patient after a discussion of the risks, benefits and alternatives to treatment. A timeout was performed prior to the initiation of the procedure. Initial ultrasound scanning demonstrates a large amount of ascites within the right mid abdominal quadrant. The right mid abdomen was prepped and draped in the usual sterile fashion. 1% lidocaine was used for local anesthesia. Following this, a 19 gauge, 7-cm, Yueh catheter was introduced. An ultrasound image was saved for documentation purposes. The paracentesis was performed. The catheter was removed and a dressing was applied. The patient tolerated the procedure well without immediate post procedural complication. FINDINGS: A total of approximately 4.1 L of yellow fluid was removed. The patient also had a very distended bladder. IMPRESSION: Successful ultrasound-guided paracentesis yielding 4.1 liters of peritoneal fluid. The patient's primary care physician was contacted about his bladder distention. She has advised for an in and out catheter to be placed. He is going to the short-stay Center for albumin and this will be done while he is in short-stay. Read by: Barnetta ChapelKelly Osborne, PA-C Electronically Signed   By: Gilmer MorJaime  Wagner D.O.   On: 02/29/2016 15:04   [place] Paracentesis  Result Date: 02/22/2016 INDICATION: Ascites secondary to alcoholic cirrhosis. Request for therapeutic paracentesis. EXAM: ULTRASOUND GUIDED RIGHT LATERAL ABDOMEN PARACENTESIS MEDICATIONS: None.  COMPLICATIONS: None immediate. PROCEDURE: Informed written consent was obtained from the patient after a discussion of the risks, benefits and alternatives to treatment. A timeout was performed prior to the initiation of the procedure. Initial ultrasound scanning demonstrates a large amount of ascites within the right lateral abdomen. The right lateral abdomen was prepped and draped in the usual sterile fashion. 1% lidocaine with epinephrine was used for local anesthesia. Following this, a 19 gauge, 7-cm, Yueh catheter was introduced. An ultrasound image was saved for documentation purposes. The paracentesis was performed. The catheter was removed and a dressing was applied. The patient tolerated the procedure well without immediate post procedural complication. FINDINGS: A total of approximately 4 liters of clear yellow fluid was removed. IMPRESSION: Successful ultrasound-guided paracentesis yielding 4 liters of peritoneal fluid. Read by:  Corrin ParkerWendy Blair, PA-C Electronically Signed   By: Simonne ComeJohn  Watts M.D.   On: 02/22/2016 15:19   [place] Paracentesis  Result Date: 02/15/2016 INDICATION: Recurrent ascites EXAM: ULTRASOUND-GUIDED PARACENTESIS COMPARISON:  Previous paracentesis MEDICATIONS: 100 cc 1% lidocaine COMPLICATIONS: None immediate. TECHNIQUE: Informed written consent was obtained from the patient after a discussion of the risks, benefits and alternatives to treatment. A timeout was performed prior to the initiation of the procedure. Initial ultrasound scanning demonstrates a large amount of ascites within the right lower abdominal quadrant. The right lower abdomen was prepped and draped in  the usual sterile fashion. 1% lidocaine with epinephrine was used for local anesthesia. Under direct ultrasound guidance, a 19 gauge, 7-cm, Yueh catheter was introduced. An ultrasound image was saved for documentation purposed. The paracentesis was performed. The catheter was removed and a dressing was applied. The patient  tolerated the procedure well without immediate post procedural complication. FINDINGS: A total of approximately 4 liters of yellow fluid was removed. Maximum per MD. IMPRESSION: Successful ultrasound-guided paracentesis yielding 4 liters of peritoneal fluid. Read by Robet Leu [facility] Electronically Signed   By: Jolaine Click M.D.   On: 02/15/2016 15:34    Labs:  CBC:  Recent Labs  09/19/15 0440 09/20/15 0841 09/22/15 0356 02/05/16 1010  WBC 9.9 10.7* 8.4 6.3  HGB 11.9* 12.3* 11.0* 12.3*  HCT 33.7* 35.3* 32.0* 38.0*  PLT 69* 90* 100* 152    COAGS:  Recent Labs  05/01/15 2254  09/05/15 2114 09/16/15 1719 10/08/15 1306 02/05/16 1010  INR 1.36  < > 1.45 1.36 1.17 1.19  APTT 36  --  38* 37  --  35  < > = values in this interval not displayed.  BMP:  Recent Labs  09/21/15 0503 09/22/15 0356 10/08/15 1306 02/05/16 1010  NA 125* 127* 136 129*  K 5.0 4.3 3.9 4.1  CL 101 100* 107 99*  CO2 18* 23 20 27   GLUCOSE 108* 104* 115* 91  BUN 31* 33* 19 <5*  CALCIUM 8.0* 8.2* 7.7* 8.3*  CREATININE 0.71 0.91 0.90 0.53*  GFRNONAA >60 >60 >89 >60  GFRAA >60 >60 >89 >60    LIVER FUNCTION TESTS:  Recent Labs  09/17/15 1529 09/18/15 0435 10/08/15 1306 02/05/16 1010  BILITOT 1.0 0.8 0.6 0.6  AST 56* 41 23 24  ALT 36 28 13 11*  ALKPHOS 79 63 72 89  PROT 5.2* 4.9* 5.3* 5.5*  ALBUMIN 2.0* 2.2* 2.5* 2.1*    TUMOR MARKERS: No results for input(s): AFPTM, CEA, CA199, CHROMGRNA in the last 8760 hours.  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  We discussed the pathophysiology of hepatic cirrhosis, portal venous hypertension, and ascites. We discussed that liver transplant is the only curative option for hepatic cirrhosis; currently he would not be an appropriate candidate because of continued ethanol use.   Within a meld score of 8, he is a relatively good candidate for TIPS, which has strong evidence to  support use in the setting of controlling ascites related to portal venous hypertension. His low meld score and  approachable anatomy based on prior CT suggest  he is a good candidate for TIPS creation for control of hepatic ascites.I reiterated that TIPS would be palliative for his ascites but not curative for his cirrhosis.   [place] shunt is more controversial treatment option for recurrent large volume ascites, usually reserved for patient's who are not TIPS candidates. Tunneled AspiraType peritoneal drain catheter  would be a reasonable short or intermediate-term option, but the patient is very leery of having a protruding catheter.  We discussed in detail the TIPS technique with anesthesia, possible risks and complications, time course to a symptom resolution, and alternatives. We discussed the possible risk of hepatic encephalopathy requiring by mouth lactulose. We discussed the possibility of hepatic dysfunction post TIPS shunting. The patient seemed to understand and had all his questions answered. He is motivated to proceed with TIPS creation. Accordingly, we can set this up at [facility] with Anesthesia Support.  Thank you for this interesting consult.  I greatly enjoyed meeting [name] and look forward to participating in their care.  A copy of this report was sent to the requesting provider on this date.  Electronically Signed: Miakoda Mcmillion III, DAYNE Josten [place] 03/15/2016, 2:11 PM   I spent a total of  60 Minutes   in face to face in clinical consultation, greater than 50% of which was counseling/coordinating care for  recurrent large volume abdominal ascites secondary to hepatic cirrhosis.

## 2016-03-21 ENCOUNTER — Ambulatory Visit: Payer: Self-pay | Admitting: Family Medicine

## 2016-03-22 ENCOUNTER — Ambulatory Visit (HOSPITAL_COMMUNITY)
Admission: RE | Admit: 2016-03-22 | Discharge: 2016-03-22 | Disposition: A | Discharge status: Home / Self Care | Payer: Medicaid Other | Source: Ambulatory Visit | Attending: Family Medicine | Admitting: Family Medicine

## 2016-03-22 DIAGNOSIS — K7031 Alcoholic cirrhosis of liver with ascites: Secondary | ICD-10-CM | POA: Insufficient documentation

## 2016-03-22 MED ORDER — ALBUMIN HUMAN 25 % IV SOLN
25.0000 g | Freq: Once | INTRAVENOUS | Status: AC
  Administered 2016-03-22: 25 g via INTRAVENOUS
  Filled 2016-03-22: qty 100

## 2016-03-22 MED ORDER — LIDOCAINE HCL 1 % IJ SOLN
INTRAMUSCULAR | Status: AC
  Filled 2016-03-22: qty 20

## 2016-03-22 NOTE — Procedures (Signed)
Successful US guided paracentesis from right lateral abdomen.  Yielded 2.4 liters of amber fluid.  No immediate complications.  Pt tolerated well.   WENDY S BLAIR PA-C 03/22/2016 3:18 PM

## 2016-03-23 ENCOUNTER — Encounter (HOSPITAL_COMMUNITY): Payer: Self-pay | Admitting: *Deleted

## 2016-03-23 ENCOUNTER — Other Ambulatory Visit: Payer: Self-pay | Admitting: Radiology

## 2016-03-23 ENCOUNTER — Other Ambulatory Visit: Payer: Self-pay | Admitting: General Surgery

## 2016-03-24 ENCOUNTER — Encounter (HOSPITAL_COMMUNITY): Payer: Self-pay

## 2016-03-24 ENCOUNTER — Ambulatory Visit (HOSPITAL_COMMUNITY)
Admission: RE | Admit: 2016-03-24 | Discharge: 2016-03-24 | Disposition: A | Discharge status: Home / Self Care | Payer: Medicaid Other | Source: Ambulatory Visit | Attending: Interventional Radiology | Admitting: Interventional Radiology

## 2016-03-24 ENCOUNTER — Encounter (HOSPITAL_COMMUNITY): Payer: Self-pay | Admitting: Internal Medicine

## 2016-03-24 ENCOUNTER — Inpatient Hospital Stay (HOSPITAL_COMMUNITY)
Admission: RE | Admit: 2016-03-24 | Discharge: 2016-04-01 | DRG: 659 | Disposition: A | Discharge status: Home Health | Payer: Medicaid Other | Source: Ambulatory Visit | Attending: Internal Medicine | Admitting: Internal Medicine

## 2016-03-24 ENCOUNTER — Inpatient Hospital Stay (HOSPITAL_COMMUNITY): Payer: Medicaid Other

## 2016-03-24 ENCOUNTER — Ambulatory Visit (HOSPITAL_COMMUNITY): Payer: Medicaid Other | Admitting: Anesthesiology

## 2016-03-24 ENCOUNTER — Ambulatory Visit (HOSPITAL_COMMUNITY): Payer: Medicaid Other

## 2016-03-24 ENCOUNTER — Encounter (HOSPITAL_COMMUNITY)
Admission: RE | Disposition: A | Discharge status: Home Health | Payer: Self-pay | Source: Ambulatory Visit | Attending: Internal Medicine

## 2016-03-24 ENCOUNTER — Encounter (HOSPITAL_COMMUNITY): Payer: Self-pay | Admitting: Anesthesiology

## 2016-03-24 DIAGNOSIS — K420 Umbilical hernia with obstruction, without gangrene: Secondary | ICD-10-CM | POA: Insufficient documentation

## 2016-03-24 DIAGNOSIS — K7460 Unspecified cirrhosis of liver: Secondary | ICD-10-CM | POA: Diagnosis present

## 2016-03-24 DIAGNOSIS — E875 Hyperkalemia: Secondary | ICD-10-CM | POA: Diagnosis present

## 2016-03-24 DIAGNOSIS — R188 Other ascites: Secondary | ICD-10-CM | POA: Diagnosis present

## 2016-03-24 DIAGNOSIS — E876 Hypokalemia: Secondary | ICD-10-CM | POA: Diagnosis present

## 2016-03-24 DIAGNOSIS — K769 Liver disease, unspecified: Secondary | ICD-10-CM

## 2016-03-24 DIAGNOSIS — Z833 Family history of diabetes mellitus: Secondary | ICD-10-CM

## 2016-03-24 DIAGNOSIS — Z720 Tobacco use: Secondary | ICD-10-CM | POA: Diagnosis present

## 2016-03-24 DIAGNOSIS — F329 Major depressive disorder, single episode, unspecified: Secondary | ICD-10-CM | POA: Diagnosis present

## 2016-03-24 DIAGNOSIS — J9611 Chronic respiratory failure with hypoxia: Secondary | ICD-10-CM

## 2016-03-24 DIAGNOSIS — K0889 Other specified disorders of teeth and supporting structures: Secondary | ICD-10-CM | POA: Diagnosis present

## 2016-03-24 DIAGNOSIS — Z79899 Other long term (current) drug therapy: Secondary | ICD-10-CM

## 2016-03-24 DIAGNOSIS — S22089A Unspecified fracture of T11-T12 vertebra, initial encounter for closed fracture: Secondary | ICD-10-CM | POA: Diagnosis present

## 2016-03-24 DIAGNOSIS — N170 Acute kidney failure with tubular necrosis: Principal | ICD-10-CM | POA: Diagnosis present

## 2016-03-24 DIAGNOSIS — E43 Unspecified severe protein-calorie malnutrition: Secondary | ICD-10-CM | POA: Insufficient documentation

## 2016-03-24 DIAGNOSIS — Z9889 Other specified postprocedural states: Secondary | ICD-10-CM

## 2016-03-24 DIAGNOSIS — F419 Anxiety disorder, unspecified: Secondary | ICD-10-CM | POA: Diagnosis present

## 2016-03-24 DIAGNOSIS — K7031 Alcoholic cirrhosis of liver with ascites: Secondary | ICD-10-CM

## 2016-03-24 DIAGNOSIS — I10 Essential (primary) hypertension: Secondary | ICD-10-CM | POA: Diagnosis present

## 2016-03-24 DIAGNOSIS — N179 Acute kidney failure, unspecified: Secondary | ICD-10-CM | POA: Diagnosis not present

## 2016-03-24 DIAGNOSIS — F1010 Alcohol abuse, uncomplicated: Secondary | ICD-10-CM | POA: Diagnosis present

## 2016-03-24 DIAGNOSIS — J439 Emphysema, unspecified: Secondary | ICD-10-CM | POA: Diagnosis present

## 2016-03-24 DIAGNOSIS — D696 Thrombocytopenia, unspecified: Secondary | ICD-10-CM

## 2016-03-24 DIAGNOSIS — R740 Nonspecific elevation of levels of transaminase and lactic acid dehydrogenase [LDH]: Secondary | ICD-10-CM | POA: Diagnosis present

## 2016-03-24 DIAGNOSIS — E871 Hypo-osmolality and hyponatremia: Secondary | ICD-10-CM

## 2016-03-24 DIAGNOSIS — R739 Hyperglycemia, unspecified: Secondary | ICD-10-CM | POA: Diagnosis not present

## 2016-03-24 DIAGNOSIS — Z01818 Encounter for other preprocedural examination: Secondary | ICD-10-CM

## 2016-03-24 DIAGNOSIS — J9610 Chronic respiratory failure, unspecified whether with hypoxia or hypercapnia: Secondary | ICD-10-CM | POA: Diagnosis present

## 2016-03-24 DIAGNOSIS — F17200 Nicotine dependence, unspecified, uncomplicated: Secondary | ICD-10-CM | POA: Diagnosis present

## 2016-03-24 DIAGNOSIS — G9340 Encephalopathy, unspecified: Secondary | ICD-10-CM | POA: Diagnosis not present

## 2016-03-24 DIAGNOSIS — J449 Chronic obstructive pulmonary disease, unspecified: Secondary | ICD-10-CM | POA: Insufficient documentation

## 2016-03-24 DIAGNOSIS — E860 Dehydration: Secondary | ICD-10-CM | POA: Insufficient documentation

## 2016-03-24 DIAGNOSIS — F325 Major depressive disorder, single episode, in full remission: Secondary | ICD-10-CM

## 2016-03-24 DIAGNOSIS — Z6821 Body mass index (BMI) 21.0-21.9, adult: Secondary | ICD-10-CM

## 2016-03-24 DIAGNOSIS — K5900 Constipation, unspecified: Secondary | ICD-10-CM

## 2016-03-24 DIAGNOSIS — F17210 Nicotine dependence, cigarettes, uncomplicated: Secondary | ICD-10-CM

## 2016-03-24 DIAGNOSIS — K219 Gastro-esophageal reflux disease without esophagitis: Secondary | ICD-10-CM | POA: Insufficient documentation

## 2016-03-24 DIAGNOSIS — K7290 Hepatic failure, unspecified without coma: Secondary | ICD-10-CM | POA: Diagnosis present

## 2016-03-24 DIAGNOSIS — R339 Retention of urine, unspecified: Secondary | ICD-10-CM

## 2016-03-24 DIAGNOSIS — K429 Umbilical hernia without obstruction or gangrene: Secondary | ICD-10-CM

## 2016-03-24 DIAGNOSIS — D689 Coagulation defect, unspecified: Secondary | ICD-10-CM

## 2016-03-24 DIAGNOSIS — A419 Sepsis, unspecified organism: Secondary | ICD-10-CM | POA: Diagnosis not present

## 2016-03-24 DIAGNOSIS — R6521 Severe sepsis with septic shock: Secondary | ICD-10-CM | POA: Diagnosis not present

## 2016-03-24 DIAGNOSIS — E46 Unspecified protein-calorie malnutrition: Secondary | ICD-10-CM | POA: Diagnosis present

## 2016-03-24 DIAGNOSIS — N62 Hypertrophy of breast: Secondary | ICD-10-CM | POA: Diagnosis present

## 2016-03-24 DIAGNOSIS — J9621 Acute and chronic respiratory failure with hypoxia: Secondary | ICD-10-CM | POA: Diagnosis not present

## 2016-03-24 DIAGNOSIS — K652 Spontaneous bacterial peritonitis: Secondary | ICD-10-CM | POA: Diagnosis not present

## 2016-03-24 DIAGNOSIS — F10188 Alcohol abuse with other alcohol-induced disorder: Secondary | ICD-10-CM | POA: Diagnosis present

## 2016-03-24 DIAGNOSIS — R911 Solitary pulmonary nodule: Secondary | ICD-10-CM | POA: Diagnosis present

## 2016-03-24 HISTORY — DX: Depression, unspecified: F32.A

## 2016-03-24 HISTORY — DX: Gastro-esophageal reflux disease without esophagitis: K21.9

## 2016-03-24 HISTORY — DX: Constipation, unspecified: K59.00

## 2016-03-24 HISTORY — DX: Essential (primary) hypertension: I10

## 2016-03-24 HISTORY — DX: Major depressive disorder, single episode, unspecified: F32.9

## 2016-03-24 HISTORY — DX: Dyspnea, unspecified: R06.00

## 2016-03-24 HISTORY — PX: IR GENERIC HISTORICAL: IMG1180011

## 2016-03-24 HISTORY — PX: RADIOLOGY WITH ANESTHESIA: SHX6223

## 2016-03-24 LAB — COMPREHENSIVE METABOLIC PANEL
ALBUMIN: 3 g/dL — AB (ref 3.5–5.0)
ALT: 47 U/L (ref 17–63)
ANION GAP: 16 — AB (ref 5–15)
AST: 107 U/L — AB (ref 15–41)
Alkaline Phosphatase: 200 U/L — ABNORMAL HIGH (ref 38–126)
BUN: 9 mg/dL (ref 6–20)
CHLORIDE: 83 mmol/L — AB (ref 101–111)
CO2: 26 mmol/L (ref 22–32)
Calcium: 8.9 mg/dL (ref 8.9–10.3)
Creatinine, Ser: 1.34 mg/dL — ABNORMAL HIGH (ref 0.61–1.24)
GFR calc Af Amer: >60 mL/min (ref >60)
GFR, EST NON AFRICAN AMERICAN: 58 mL/min — AB (ref >60)
GLUCOSE: 152 mg/dL — AB (ref 65–99)
POTASSIUM: 3.3 mmol/L — AB (ref 3.5–5.1)
Sodium: 125 mmol/L — ABNORMAL LOW (ref 135–145)
Total Bilirubin: 2 mg/dL — ABNORMAL HIGH (ref 0.3–1.2)
Total Protein: 7.8 g/dL (ref 6.5–8.1)

## 2016-03-24 LAB — GLUCOSE, CAPILLARY: GLUCOSE-CAPILLARY: 88 mg/dL (ref 65–99)

## 2016-03-24 LAB — BLOOD GAS, ARTERIAL
ACID-BASE EXCESS: 5.6 mmol/L — AB (ref 0.0–2.0)
Bicarbonate: 29.5 mmol/L — ABNORMAL HIGH (ref 20.0–28.0)
Drawn by: 330991
FIO2: 60
LHR: 16 {breaths}/min
O2 Saturation: 99.7 %
PCO2 ART: 41.2 mmHg (ref 32.0–48.0)
PEEP: 5 cmH2O
Patient temperature: 97.3
VT: 560 mL
pH, Arterial: 7.466 — ABNORMAL HIGH (ref 7.350–7.450)
pO2, Arterial: 224 mmHg — ABNORMAL HIGH (ref 83.0–108.0)

## 2016-03-24 LAB — CBC WITH DIFFERENTIAL/PLATELET
BASOS ABS: 0 10*3/uL (ref 0.0–0.1)
BASOS PCT: 0 %
EOS PCT: 0 %
Eosinophils Absolute: 0 10*3/uL (ref 0.0–0.7)
HCT: 47.8 % (ref 39.0–52.0)
Hemoglobin: 17.6 g/dL — ABNORMAL HIGH (ref 13.0–17.0)
Lymphocytes Relative: 12 %
Lymphs Abs: 1.4 10*3/uL (ref 0.7–4.0)
MCH: 88.8 pg — ABNORMAL HIGH (ref 26.0–34.0)
MCHC: 36.8 g/dL — AB (ref 30.0–36.0)
MCV: 88.8 fL (ref 78.0–100.0)
MONO ABS: 1.3 10*3/uL — AB (ref 0.1–1.0)
Monocytes Relative: 11 %
Neutro Abs: 8.9 10*3/uL — ABNORMAL HIGH (ref 1.7–7.7)
Neutrophils Relative %: 77 %
PLATELETS: 143 10*3/uL — AB (ref 150–400)
RBC: 5.38 MIL/uL (ref 4.22–5.81)
RDW: 13.9 % (ref 11.5–15.5)
WBC: 11.5 10*3/uL — ABNORMAL HIGH (ref 4.0–10.5)

## 2016-03-24 LAB — PROTIME-INR
INR: 1.47
PROTHROMBIN TIME: 17.9 s — AB (ref 11.4–15.2)

## 2016-03-24 LAB — ABO/RH: ABO/RH(D): B POS

## 2016-03-24 LAB — TROPONIN I: TROPONIN I: 0.04 ng/mL — AB (ref <0.03)

## 2016-03-24 LAB — TYPE AND SCREEN
ABO/RH(D): B POS
ANTIBODY SCREEN: NEGATIVE

## 2016-03-24 LAB — PROCALCITONIN: Procalcitonin: 0.27 ng/mL

## 2016-03-24 LAB — CORTISOL: CORTISOL PLASMA: 14.4 ug/dL

## 2016-03-24 LAB — APTT: APTT: 38 s — AB (ref 24–36)

## 2016-03-24 SURGERY — RADIOLOGY WITH ANESTHESIA
Anesthesia: General

## 2016-03-24 MED ORDER — PHENYLEPHRINE HCL 10 MG/ML IJ SOLN
INTRAMUSCULAR | Status: DC | PRN
  Administered 2016-03-24: 160 ug via INTRAVENOUS

## 2016-03-24 MED ORDER — MIDAZOLAM HCL 2 MG/2ML IJ SOLN
1.0000 mg | INTRAMUSCULAR | Status: DC | PRN
  Administered 2016-03-24 – 2016-03-25 (×2): 2 mg via INTRAVENOUS
  Filled 2016-03-24 (×2): qty 2

## 2016-03-24 MED ORDER — FAMOTIDINE 20 MG PO TABS
10.0000 mg | ORAL_TABLET | Freq: Every day | ORAL | Status: DC
Start: 2016-03-25 — End: 2016-03-24

## 2016-03-24 MED ORDER — MIDAZOLAM HCL 5 MG/5ML IJ SOLN
INTRAMUSCULAR | Status: DC | PRN
  Administered 2016-03-24: 2 mg via INTRAVENOUS

## 2016-03-24 MED ORDER — SODIUM CHLORIDE 0.9% FLUSH
3.0000 mL | Freq: Two times a day (BID) | INTRAVENOUS | Status: DC
  Administered 2016-03-24 – 2016-04-01 (×14): 3 mL via INTRAVENOUS

## 2016-03-24 MED ORDER — FUROSEMIDE 20 MG PO TABS: 20.0000 mg | ORAL_TABLET | Freq: Every day | ORAL | Status: DC

## 2016-03-24 MED ORDER — FENTANYL CITRATE (PF) 100 MCG/2ML IJ SOLN
INTRAMUSCULAR | Status: AC
  Administered 2016-03-24: 100 ug via INTRAVENOUS
  Filled 2016-03-24: qty 2

## 2016-03-24 MED ORDER — ALBUTEROL SULFATE HFA 108 (90 BASE) MCG/ACT IN AERS: 2.0000 | INHALATION_SPRAY | Freq: Four times a day (QID) | RESPIRATORY_TRACT | Status: DC | PRN

## 2016-03-24 MED ORDER — PIPERACILLIN-TAZOBACTAM 3.375 G IVPB
3.3750 g | Freq: Three times a day (TID) | INTRAVENOUS | Status: DC
  Administered 2016-03-25 – 2016-04-01 (×23): 3.375 g via INTRAVENOUS
  Filled 2016-03-24 (×28): qty 50

## 2016-03-24 MED ORDER — HEPARIN SODIUM (PORCINE) 5000 UNIT/ML IJ SOLN: 5000.0000 [IU] | Freq: Three times a day (TID) | INTRAMUSCULAR | Status: DC

## 2016-03-24 MED ORDER — FENTANYL CITRATE (PF) 100 MCG/2ML IJ SOLN
INTRAMUSCULAR | Status: AC
  Administered 2016-03-24: 100 ug via INTRAVENOUS
  Filled 2016-03-24: qty 4

## 2016-03-24 MED ORDER — FENTANYL CITRATE (PF) 100 MCG/2ML IJ SOLN
100.0000 ug | Freq: Once | INTRAMUSCULAR | Status: AC
  Administered 2016-03-24: 100 ug via INTRAVENOUS

## 2016-03-24 MED ORDER — FAMOTIDINE IN NACL 20-0.9 MG/50ML-% IV SOLN
20.0000 mg | INTRAVENOUS | Status: DC
  Administered 2016-03-25: 20 mg via INTRAVENOUS
  Filled 2016-03-24: qty 50

## 2016-03-24 MED ORDER — ALBUTEROL SULFATE (2.5 MG/3ML) 0.083% IN NEBU
2.5000 mg | INHALATION_SOLUTION | Freq: Four times a day (QID) | RESPIRATORY_TRACT | Status: DC
  Administered 2016-03-24 – 2016-03-26 (×5): 2.5 mg via RESPIRATORY_TRACT
  Filled 2016-03-24 (×4): qty 3

## 2016-03-24 MED ORDER — CITALOPRAM HYDROBROMIDE 20 MG PO TABS
20.0000 mg | ORAL_TABLET | Freq: Every evening | ORAL | Status: DC
  Administered 2016-03-25 – 2016-03-31 (×7): 20 mg via ORAL
  Filled 2016-03-24 (×8): qty 1

## 2016-03-24 MED ORDER — PHENYLEPHRINE HCL 10 MG/ML IJ SOLN
INTRAVENOUS | Status: DC | PRN
  Administered 2016-03-24: 50 ug/min via INTRAVENOUS

## 2016-03-24 MED ORDER — FENTANYL CITRATE (PF) 100 MCG/2ML IJ SOLN
25.0000 ug | INTRAMUSCULAR | Status: DC | PRN
  Administered 2016-03-24 (×3): 50 ug via INTRAVENOUS
  Filled 2016-03-24: qty 2

## 2016-03-24 MED ORDER — ALBUTEROL SULFATE (2.5 MG/3ML) 0.083% IN NEBU: 2.5000 mg | INHALATION_SOLUTION | RESPIRATORY_TRACT | Status: DC | PRN

## 2016-03-24 MED ORDER — LACTATED RINGERS IV SOLN
INTRAVENOUS | Status: DC
  Administered 2016-03-24 (×3): via INTRAVENOUS

## 2016-03-24 MED ORDER — CHLORHEXIDINE GLUCONATE CLOTH 2 % EX PADS
6.0000 | MEDICATED_PAD | Freq: Once | CUTANEOUS | Status: AC
  Administered 2016-03-24: 6 via TOPICAL

## 2016-03-24 MED ORDER — SPIRONOLACTONE 25 MG PO TABS
50.0000 mg | ORAL_TABLET | Freq: Every day | ORAL | Status: DC
Start: 2016-03-25 — End: 2016-04-01
  Administered 2016-03-25 – 2016-04-01 (×8): 50 mg via ORAL
  Filled 2016-03-24 (×8): qty 2

## 2016-03-24 MED ORDER — PROPOFOL 1000 MG/100ML IV EMUL
5.0000 ug/kg/min | INTRAVENOUS | Status: DC
  Administered 2016-03-24 (×2): 80 ug/kg/min via INTRAVENOUS
  Administered 2016-03-25: 40 ug/kg/min via INTRAVENOUS
  Filled 2016-03-24 (×2): qty 100

## 2016-03-24 MED ORDER — LIDOCAINE HCL (CARDIAC) 20 MG/ML IV SOLN
INTRAVENOUS | Status: DC | PRN
  Administered 2016-03-24: 40 mg via INTRAVENOUS

## 2016-03-24 MED ORDER — VANCOMYCIN HCL 10 G IV SOLR
1250.0000 mg | INTRAVENOUS | Status: AC
  Filled 2016-03-24: qty 1250

## 2016-03-24 MED ORDER — ONDANSETRON HCL 4 MG/2ML IJ SOLN: 4.0000 mg | Freq: Once | INTRAMUSCULAR | Status: DC | PRN

## 2016-03-24 MED ORDER — ONDANSETRON HCL 4 MG PO TABS: 4.0000 mg | ORAL_TABLET | Freq: Four times a day (QID) | ORAL | Status: DC | PRN

## 2016-03-24 MED ORDER — FENTANYL CITRATE (PF) 100 MCG/2ML IJ SOLN
INTRAMUSCULAR | Status: DC | PRN
  Administered 2016-03-24 (×2): 50 ug via INTRAVENOUS
  Administered 2016-03-24: 100 ug via INTRAVENOUS

## 2016-03-24 MED ORDER — IOPAMIDOL (ISOVUE-300) INJECTION 61%
INTRAVENOUS | Status: AC
  Administered 2016-03-24: 10 mL
  Filled 2016-03-24: qty 100

## 2016-03-24 MED ORDER — PROPOFOL 10 MG/ML IV BOLUS
INTRAVENOUS | Status: DC | PRN
  Administered 2016-03-24: 130 mg via INTRAVENOUS
  Administered 2016-03-24: 40 mg via INTRAVENOUS

## 2016-03-24 MED ORDER — ROCURONIUM BROMIDE 100 MG/10ML IV SOLN
INTRAVENOUS | Status: DC | PRN
  Administered 2016-03-24: 50 mg via INTRAVENOUS
  Administered 2016-03-24: 30 mg via INTRAVENOUS
  Administered 2016-03-24: 40 mg via INTRAVENOUS
  Administered 2016-03-24: 20 mg via INTRAVENOUS

## 2016-03-24 MED ORDER — SUCCINYLCHOLINE CHLORIDE 20 MG/ML IJ SOLN
INTRAMUSCULAR | Status: DC | PRN
  Administered 2016-03-24: 100 mg via INTRAVENOUS

## 2016-03-24 MED ORDER — PROPOFOL 500 MG/50ML IV EMUL
INTRAVENOUS | Status: DC | PRN
  Administered 2016-03-24: 50 ug/kg/min via INTRAVENOUS

## 2016-03-24 MED ORDER — CEFAZOLIN SODIUM-DEXTROSE 2-4 GM/100ML-% IV SOLN
2.0000 g | INTRAVENOUS | Status: AC
  Administered 2016-03-24: 2 g via INTRAVENOUS
  Filled 2016-03-24: qty 100

## 2016-03-24 MED ORDER — FOLIC ACID 5 MG/ML IJ SOLN
1.0000 mg | Freq: Every day | INTRAMUSCULAR | Status: DC
  Administered 2016-03-25 – 2016-03-27 (×3): 1 mg via INTRAVENOUS
  Filled 2016-03-24 (×4): qty 0.2

## 2016-03-24 MED ORDER — POTASSIUM CHLORIDE CRYS ER 20 MEQ PO TBCR: 20.0000 meq | EXTENDED_RELEASE_TABLET | Freq: Every evening | ORAL | Status: DC

## 2016-03-24 MED ORDER — CHLORHEXIDINE GLUCONATE 0.12% ORAL RINSE (MEDLINE KIT)
15.0000 mL | Freq: Two times a day (BID) | OROMUCOSAL | Status: DC
  Administered 2016-03-24: 15 mL via OROMUCOSAL

## 2016-03-24 MED ORDER — ONDANSETRON HCL 4 MG/2ML IJ SOLN
4.0000 mg | Freq: Four times a day (QID) | INTRAMUSCULAR | Status: DC | PRN
  Administered 2016-03-27 – 2016-03-31 (×3): 4 mg via INTRAVENOUS
  Filled 2016-03-24 (×3): qty 2

## 2016-03-24 MED ORDER — CEFAZOLIN SODIUM-DEXTROSE 2-4 GM/100ML-% IV SOLN
2.0000 g | INTRAVENOUS | Status: DC
  Filled 2016-03-24: qty 100

## 2016-03-24 MED ORDER — LACTULOSE 10 GM/15ML PO SOLN
10.0000 g | Freq: Two times a day (BID) | ORAL | Status: DC
  Administered 2016-03-25 – 2016-03-27 (×5): 10 g via ORAL
  Filled 2016-03-24 (×5): qty 15

## 2016-03-24 MED ORDER — CHLORHEXIDINE GLUCONATE CLOTH 2 % EX PADS
6.0000 | MEDICATED_PAD | Freq: Once | CUTANEOUS | Status: DC
Start: 2016-03-24 — End: 2016-03-25

## 2016-03-24 MED ORDER — ORAL CARE MOUTH RINSE
15.0000 mL | Freq: Four times a day (QID) | OROMUCOSAL | Status: DC
  Administered 2016-03-25 – 2016-03-26 (×5): 15 mL via OROMUCOSAL

## 2016-03-24 MED ORDER — LIDOCAINE HCL 1 % IJ SOLN
INTRAMUSCULAR | Status: DC
Start: 2016-03-24 — End: 2016-03-24
  Filled 2016-03-24: qty 20

## 2016-03-24 MED ORDER — SODIUM CHLORIDE 0.9 % IV SOLN
500.0000 mg | Freq: Two times a day (BID) | INTRAVENOUS | Status: DC
  Administered 2016-03-25 – 2016-03-27 (×5): 500 mg via INTRAVENOUS
  Filled 2016-03-24 (×6): qty 500

## 2016-03-24 MED ORDER — ALBUMIN HUMAN 5 % IV SOLN
INTRAVENOUS | Status: DC | PRN
  Administered 2016-03-24: 13:00:00 via INTRAVENOUS

## 2016-03-24 MED ORDER — IOPAMIDOL (ISOVUE-300) INJECTION 61%
INTRAVENOUS | Status: AC
  Administered 2016-03-24: 70 mL
  Filled 2016-03-24: qty 150

## 2016-03-24 MED ORDER — SODIUM CHLORIDE 0.9 % IV SOLN
INTRAVENOUS | Status: DC
  Administered 2016-03-24: 14:00:00 via INTRAVENOUS
  Administered 2016-03-26: 75 mL/h via INTRAVENOUS
  Administered 2016-03-27: 09:00:00 via INTRAVENOUS

## 2016-03-24 MED ORDER — PHENYLEPHRINE HCL 10 MG/ML IJ SOLN
0.0000 ug/min | INTRAMUSCULAR | Status: DC
  Administered 2016-03-25: 60 ug/min via INTRAVENOUS
  Administered 2016-03-25: 80 ug/min via INTRAVENOUS
  Filled 2016-03-24 (×4): qty 1

## 2016-03-24 MED ORDER — THIAMINE HCL 100 MG/ML IJ SOLN
100.0000 mg | Freq: Every day | INTRAMUSCULAR | Status: DC
  Administered 2016-03-25 – 2016-03-27 (×3): 100 mg via INTRAVENOUS
  Filled 2016-03-24 (×3): qty 2

## 2016-03-24 MED ORDER — CEFAZOLIN SODIUM-DEXTROSE 2-4 GM/100ML-% IV SOLN
INTRAVENOUS | Status: AC
  Filled 2016-03-24: qty 100

## 2016-03-24 MED ORDER — FENTANYL CITRATE (PF) 100 MCG/2ML IJ SOLN: 25.0000 ug | INTRAMUSCULAR | Status: DC | PRN

## 2016-03-24 NOTE — H&P (Signed)
History and Physical    Bryce MawRobert [name] ZHY:[id number] DOB: [birth year] DOA: 03/24/2016  PCP: Jaclyn ShaggyEnobong, [name], [facility] Patient coming from: home  Chief Complaint: abdominal pain/urinary retention/s/p TIPS  HPI: Bryce MawRobert [name] is a 55 y.o. male with medical history significant for EtOH induced cirrhosis decompensated with the care of hospice, COPD, chronic respiratory failure not on home oxygen, persistent ascites with weekly paracentesis, depression, hypertension presents to short stay for TIPS procedure. We are asked to admit to medical service given patient's acute kidney injury  Information is obtained from the chart and the patient. He states he's been, to the hospital every week to have "fluid drained off my belly". He reports some soreness postprocedure but lately abdominal pain worsened. He complains of difficulty initiating flow when urinating denies weak stream but does not feel his bladder is empty when he is finished voiding. He denies headache dizziness syncope or near-syncope. He denies chest pain palpitations worsening shortness of breath or cough. He denies fever chills lower extremity edema recent travel or sick contacts. He also reports his umbilical hernia seems "bigger" and says most recent paracentesis.   ED Course: Patient in short stay awaiting TIPS procedure he is afebrile hemodynamically stable and not hypoxic  Review of Systems: As per HPI otherwise 10 point review of systems negative.   Ambulatory Status: Ambulates with a walker reports a fall 4 months ago  Past Medical History:  Diagnosis Date  . Cirrhosis of liver (HCC)   . Constipation   . COPD (chronic obstructive pulmonary disease) (HCC)   . Depression   . Dyspnea   . ETOH abuse   . GERD (gastroesophageal reflux disease)   . Hypertension     Past Surgical History:  Procedure Laterality Date  . MANDIBLE FRACTURE SURGERY  30 yrs. ago   pt. states he has a steele plate  . NO PAST SURGERIES      jaw surgery 7866yrs ago    Social History   Social History  . Marital status: Single    Spouse name: N/A  . Number of children: N/A  . Years of education: N/A   Occupational History  . Not on file.   Social History Main Topics  . Smoking status: Current Every Day Smoker    Packs/day: 0.25    Years: 40.00    Types: Cigarettes  . Smokeless tobacco: Never Used  . Alcohol use No     Comment: 05/28/15-states still not drinking  . Drug use: No  . Sexual activity: No   Other Topics Concern  . Not on file   Social History Narrative  . No narrative on file  Reportedly lives at home with his brother. History of homelessness.  No Known Allergies  Family History  Problem Relation Age of Onset  . Diabetes Mother     Prior to Admission medications   Medication Sig Start Date End Date Taking? Authorizing Provider  albuterol (PROVENTIL HFA;VENTOLIN HFA) 108 (90 Base) MCG/ACT inhaler Inhale 2 puffs into the lungs every 6 (six) hours as needed for wheezing or shortness of breath. 12/02/15  Yes [name], MD  citalopram (CELEXA) 20 MG tablet Take 1 tablet (20 mg total) by mouth daily. Patient taking differently: Take 20 mg by mouth every evening.  03/10/16  Yes [name], MD  diclofenac sodium (VOLTAREN) 1 % GEL Apply 4 g topically 4 (four) times daily. Patient taking differently: Apply 4 g topically 4 (four) times daily as needed (for pain).  10/21/15  Yes [name], MD  furosemide (LASIX) 20 MG tablet TAKE 1 TABLET BY MOUTH DAILY. Patient taking differently: TAKE 1 TABLET BY MOUTH DAILY IN THE AFTERNOON 03/02/16  Yes [name], MD  lactulose (CHRONULAC) 10 GM/15ML solution Take 15 mLs (10 g total) by mouth 2 (two) times daily. 07/22/15  Yes [name], MD  potassium chloride SA (K-DUR,KLOR-CON) 20 MEQ tablet Take 1 tablet (20 mEq total) by mouth daily. Patient taking differently: Take 20 mEq by mouth every evening.  03/10/16  Yes [name], MD  ranitidine (ZANTAC) 150  MG tablet Take 1 tablet (150 mg total) by mouth 2 (two) times daily. 11/22/15  Yes [name], MD  spironolactone (ALDACTONE) 50 MG tablet Take 1 tablet (50 mg total) by mouth daily. 03/10/16  Yes [name], MD    Physical Exam: Vitals:   03/24/16 0801 03/24/16 0849 03/24/16 0851  BP: (!) 138/92    Pulse: (!) 113 (!) 101   Resp: 16    Temp: 97.3 F (36.3 C)    TempSrc: Oral    SpO2: 99%    Weight: 73 kg (161 lb)  56.9 kg (125 lb 8 oz)  Height:  6\' 1"  (1.854 m)      General:  Appears Slightly anxious and frail chronically ill not uncomfortable Eyes:  PERRL, EOMI, normal lids, iris ENT:  grossly normal hearing, lips & tongue, because membranes of his mouth are pink but dry Neck:  no LAD, masses or thyromegaly Cardiovascular:  RRR, no m/r/g. No LE edema.  Respiratory:  CTA bilaterally distant, no w/r/r. Normal respiratory effort. Abdomen:  soft, large umbilical hernia sluggish bowel sounds mild diffuse tenderness throughout Skin:  no rash or induration seen on limited exam Musculoskeletal:  grossly normal tone BUE/BLE, good ROM, no bony abnormality Psychiatric:  grossly normal mood and affect, speech fluent and appropriate, AOx3 Neurologic:  CN 2-12 grossly intact, moves all extremities in coordinated fashion, sensation intact  Labs on Admission: I have personally reviewed following labs and imaging studies  CBC:  Recent Labs Lab 03/24/16 0816  WBC 11.5*  NEUTROABS 8.9*  HGB 17.6*  HCT 47.8  MCV 88.8  PLT 143*   Basic Metabolic Panel:  Recent Labs Lab 03/24/16 0816  NA 125*  K 3.3*  CL 83*  CO2 26  GLUCOSE 152*  BUN 9  CREATININE 1.34*  CALCIUM 8.9   GFR: Estimated Creatinine Clearance: 50.1 mL/min (by C-G formula based on SCr of 1.34 mg/dL (H)). Liver Function Tests:  Recent Labs Lab 03/24/16 0816  AST 107*  ALT 47  ALKPHOS 200*  BILITOT 2.0*  PROT 7.8  ALBUMIN 3.0*   No results for input(s): LIPASE, AMYLASE in the last 168 hours. No results  for input(s): AMMONIA in the last 168 hours. Coagulation Profile:  Recent Labs Lab 03/24/16 0816  INR 1.47   Cardiac Enzymes: No results for input(s): CKTOTAL, CKMB, CKMBINDEX, TROPONINI in the last 168 hours. BNP (last 3 results) No results for input(s): PROBNP in the last 8760 hours. HbA1C: No results for input(s): HGBA1C in the last 72 hours. CBG: No results for input(s): GLUCAP in the last 168 hours. Lipid Profile: No results for input(s): CHOL, HDL, LDLCALC, TRIG, CHOLHDL, LDLDIRECT in the last 72 hours. Thyroid Function Tests: No results for input(s): TSH, T4TOTAL, FREET4, T3FREE, THYROIDAB in the last 72 hours. Anemia Panel: No results for input(s): VITAMINB12, FOLATE, FERRITIN, TIBC, IRON, RETICCTPCT in the last 72 hours. Urine analysis:    Component Value Date/Time  COLORURINE AMBER (A) 09/06/2015 0714   APPEARANCEUR CLEAR 09/06/2015 0714   LABSPEC 1.017 09/06/2015 0714   PHURINE 6.0 09/06/2015 0714   GLUCOSEU NEGATIVE 09/06/2015 0714   HGBUR TRACE (A) 09/06/2015 0714   BILIRUBINUR SMALL (A) 09/06/2015 0714   KETONESUR 15 (A) 09/06/2015 0714   PROTEINUR NEGATIVE 09/06/2015 0714   UROBILINOGEN 0.2 04/11/2015 0800   NITRITE POSITIVE (A) 09/06/2015 0714   LEUKOCYTESUR SMALL (A) 09/06/2015 0714    Creatinine Clearance: Estimated Creatinine Clearance: 50.1 mL/min (by C-G formula based on SCr of 1.34 mg/dL (H)).  Sepsis Labs: @LABRCNTIP (procalcitonin:4,lacticidven:4) )No results found for this or any previous visit (from the past 240 hour(s)).   Radiological Exams on Admission: Dg Chest 2 View  Result Date: 03/24/2016 CLINICAL DATA:  Pre op today for TIPS,copd,liver dz,htn,,weakness today EXAM: CHEST  2 VIEW COMPARISON:  09/05/2015 FINDINGS: Lungs are hyperinflated. Heart size is normal. No focal consolidations or pleural effusions are identified. Overlying the left mid lung zone there is a 9 mm nodule. Remote rib fractures. IMPRESSION: 1. 9 mm nodule overlying  the left mid lung zone. As needed, further characterization can be performed with CT of the chest. Intravenous contrast is recommended unless it is contraindicated. 2. No acute consolidation. These results will be called to the ordering clinician or representative by the Radiologist Assistant, and communication documented in the PACS or zVision Dashboard. Electronically Signed   By: Norva Pavlov M.D.   On: 03/24/2016 08:51   US Paracentesis  Result Date: 03/22/2016 INDICATION: History of alcoholic cirrhosis with recurrent ascites. Request for therapeutic paracentesis EXAM: ULTRASOUND GUIDED RIGHT LATERAL ABDOMEN PARACENTESIS MEDICATIONS: 1% Lidocaine. COMPLICATIONS: None immediate. PROCEDURE: Informed written consent was obtained from the patient after a discussion of the risks, benefits and alternatives to treatment. A timeout was performed prior to the initiation of the procedure. Initial ultrasound scanning demonstrates a large amount of ascites within the right lateral abdomen. The right lateral abdomen was prepped and draped in the usual sterile fashion. 1% lidocaine with epinephrine was used for local anesthesia. Following this, a 19 gauge, 7-cm, Yueh catheter was introduced. An ultrasound image was saved for documentation purposes. The paracentesis was performed. The catheter was removed and a dressing was applied. The patient tolerated the procedure well without immediate post procedural complication. FINDINGS: A total of approximately 2.4 liters of amber fluid was removed. IMPRESSION: Successful ultrasound-guided paracentesis yielding 2.4 liters of peritoneal fluid. Read by:  Corrin Parker, PA-C Electronically Signed   By: Jolaine Click M.D.   On: 03/22/2016 15:18    EKG:   Assessment/Plan Principal Problem:   AKI (acute kidney injury) (HCC) Active Problems:   Thrombocytopenia (HCC)   Alcoholic cirrhosis of liver with ascites (HCC)   Tobacco abuse   Decompensated hepatic cirrhosis (HCC)    Hypokalemia   Protein-calorie malnutrition (HCC)   Ascites   Chronic respiratory failure (HCC)   Chronic liver disease and cirrhosis (HCC)   GERD (gastroesophageal reflux disease)   Umbilical hernia   Lung nodule   1. Acute kidney injury. Patient with history of same. Chart review indicates his baseline creatinine less than 1. Currently 1.3. He describes urinary retention. Unknown if enlarged prostate. -Foley catheter -Hold nephrotoxins -Renal ultrasound status post TIPS procedure  #2. Hyponatremia/hyperkalemia. History of same. Chart review indicates patient's baseline is low end of normal. Sodium with unusual. Likely related to frequent paracentesis in setting of chronic EtOH cirrhosis -Urine creatinine -Urine sodium -Replete potassium -Monitor closely  #3. Decompensated hepatic cirrhosis. Patient  has been having weekly paracentesis. At one point he was under hospice care. He then was recommended for TIPS procedure. A procedure scheduled today. Lab work indicates worsening over the last 2 months. Concern for encephalopathy postprocedure. -Continue home lactulose -Continue home Lasix and spironolactone -Monitor ammonia level  #4. Umbilical hernia. A weighted by interventional radiology who opined currently somewhat larger and more incarcerated since last paracentesis. -defer to interventional radiology --TIPS procedure may need to be postponed  #5. Thrombocytopenia. Likely related to #3. INR 1.47 today. No signs symptoms of bleeding -Monitor  6. GERD. Appears stable at baseline. -Continue home meds  #. COPD/chronic respiratory failure/tobacco use/ lung nodule. Not on home oxygen. Saturation level greater than 90% on room air. Chest x-ray with new nodule Does use hand-held inhaler. Continues to smoke -Nebulizers every 6 hours -Continue home meds -Will likely need chest CT -Cessation counseling offered    DVT prophylaxis: scd Code Status: full  Family Communication:  brother at bedside  Disposition Plan: home  Consults called: IR  Admission status: obs    Toya Smothers M MD [facility]  If 7PM-7AM, please contact night-coverage [email or web address] Password [email or web address]  03/24/2016, 11:02 AM

## 2016-03-24 NOTE — Consult Note (Signed)
Reason for Consult:  Umbilical hernia incarceration Referring Physician: Saverio Danker, [facility] PCP:  Arnoldo Morale, MD     [name] is an 55 y.o. male.  HPI: Patient is a 55 year old gentleman with history of alcoholic cirrhosis. There are multiple notes within the chart showing that he has also been homeless for periods of time. His last office note by Dr.Enobon [name] describes a decompensated liver cirrhosis with ascites. He's undergone multiple therapeutic paracenteses for this. Last paracentesis on 03/22/16 removed 2.4 L. Paracentesis on 03/13/16 he underwent a 4 L paracentesis. Since the procedure on 03/22/16 his hernia has been more prominent, he has had some nausea. He is not obstructed and just had a bowel movement here in the preop area while I was here examining him. He presents today for a TIPS procedure by interventional radiology. On exam in the preop area his hernia is quite large at the umbilicus. Multiple attempts to reduce this have been unsuccessful. It appears in the umbilical area he has either fluid or omentum within this space. It is quite erythematous, it's somewhat tender but he has tolerated a good deal of manipulation and pressure with the attempts to reduce it. He's having some dry heaves some mild nausea with this. We were asked to see and evaluate. There were concerned about proceeding with TIPS procedure while he is so distended and possibly incarcerated. In addition to his cirrhosis he has back pain, neck pain, cannot tolerate lying flat, and has significant shortness of breath with COPD.He  continues to smoke.  Evaluation in the Pre op area shows he is afebrile heart rate was 101 with blood pressure 138/92. Labs shows a sodium of 125 a potassium of 3.3 a creatinine of 1.34 phosphorus 200 AST is 107 bilirubin is 2.0. INR is 1.47. WBC is 11.5, hemoglobin is 17.6 hematocrit is 47 platelets are 143,000.  She has previously been on hospice, but this may have held while he  undergoes a TIPS procedure. We have been asked to see and assist with the incarcerated hernia.    Past Medical History:  Diagnosis Date  . Cirrhosis of liver ([place])   . Constipation   . COPD (chronic obstructive pulmonary disease) ([place])   . Depression   . Dyspnea   . ETOH abuse   . GERD (gastroesophageal reflux disease)   . Hypertension     Past Surgical History:  Procedure Laterality Date  . MANDIBLE FRACTURE SURGERY  30 yrs. ago   pt. states he has a steele plate  . NO PAST SURGERIES     jaw surgery 79yr ago    Family History  Problem Relation Age of Onset  . Diabetes Mother     Social History:  reports that he has been smoking Cigarettes.  He has a 10.00 pack-year smoking history. He has never used smokeless tobacco. He reports that he does not drink alcohol or use drugs.  Tobacco: ongoing since age [id number]:  Hx of heavy use Drugs:  Denies Hx of being homeless,    Allergies: No Known Allergies    Prior to Admission medications   Medication Sig Start Date End Date Taking? Authorizing Provider  albuterol (PROVENTIL HFA;VENTOLIN HFA) 108 (90 Base) MCG/ACT inhaler Inhale 2 puffs into the lungs every 6 (six) hours as needed for wheezing or shortness of breath. 12/02/15   EArnoldo Morale MD  citalopram (CELEXA) 20 MG tablet Take 1 tablet (20 mg total) by mouth daily. Patient taking differently: Take 20 mg by mouth every  evening.  03/10/16   Arnoldo Morale, MD  diclofenac sodium (VOLTAREN) 1 % GEL Apply 4 g topically 4 (four) times daily. Patient taking differently: Apply 4 g topically 4 (four) times daily as needed (for pain).  10/21/15   Arnoldo Morale, MD  furosemide (LASIX) 20 MG tablet TAKE 1 TABLET BY MOUTH DAILY. Patient taking differently: TAKE 1 TABLET BY MOUTH DAILY IN THE AFTERNOON 03/02/16   Arnoldo Morale, MD  lactulose (CHRONULAC) 10 GM/15ML solution Take 15 mLs (10 g total) by mouth 2 (two) times daily. 07/22/15   Arnoldo Morale, MD  potassium chloride SA  (K-DUR,KLOR-CON) 20 MEQ tablet Take 1 tablet (20 mEq total) by mouth daily. Patient taking differently: Take 20 mEq by mouth every evening.  03/10/16   Arnoldo Morale, MD  ranitidine (ZANTAC) 150 MG tablet Take 1 tablet (150 mg total) by mouth 2 (two) times daily. 11/22/15   Arnoldo Morale, MD  spironolactone (ALDACTONE) 50 MG tablet Take 1 tablet (50 mg total) by mouth daily. 03/10/16   Arnoldo Morale, MD     Results for orders placed or performed during the hospital encounter of 03/24/16 (from the past 48 hour(s))  APTT     Status: Abnormal   Collection Time: 03/24/16  8:16 AM  Result Value Ref Range   aPTT 38 (H) 24 - 36 seconds    Comment:        IF BASELINE aPTT IS ELEVATED, SUGGEST PATIENT RISK ASSESSMENT BE USED TO DETERMINE APPROPRIATE ANTICOAGULANT THERAPY.   CBC with Differential/Platelet     Status: Abnormal   Collection Time: 03/24/16  8:16 AM  Result Value Ref Range   WBC 11.5 (H) 4.0 - 10.5 K/uL   RBC 5.38 4.22 - 5.81 MIL/uL   Hemoglobin 17.6 (H) 13.0 - 17.0 g/dL   HCT 47.8 39.0 - 52.0 %   MCV 88.8 78.0 - 100.0 fL   MCH 88.8 (H) 26.0 - 34.0 pg   MCHC 36.8 (H) 30.0 - 36.0 g/dL   RDW 13.9 11.5 - 15.5 %   Platelets 143 (L) 150 - 400 K/uL   Neutrophils Relative % 77 %   Neutro Abs 8.9 (H) 1.7 - 7.7 K/uL   Lymphocytes Relative 12 %   Lymphs Abs 1.4 0.7 - 4.0 K/uL   Monocytes Relative 11 %   Monocytes Absolute 1.3 (H) 0.1 - 1.0 K/uL   Eosinophils Relative 0 %   Eosinophils Absolute 0.0 0.0 - 0.7 K/uL   Basophils Relative 0 %   Basophils Absolute 0.0 0.0 - 0.1 K/uL  Comprehensive metabolic panel     Status: Abnormal   Collection Time: 03/24/16  8:16 AM  Result Value Ref Range   Sodium 125 (L) 135 - 145 mmol/L   Potassium 3.3 (L) 3.5 - 5.1 mmol/L   Chloride 83 (L) 101 - 111 mmol/L   CO2 26 22 - 32 mmol/L   Glucose, Bld 152 (H) 65 - 99 mg/dL   BUN 9 6 - 20 mg/dL   Creatinine, Ser 1.34 (H) 0.61 - 1.24 mg/dL   Calcium 8.9 8.9 - 10.3 mg/dL   Total Protein 7.8 6.5 - 8.1  g/dL   Albumin 3.0 (L) 3.5 - 5.0 g/dL   AST 107 (H) 15 - 41 U/L   ALT 47 17 - 63 U/L   Alkaline Phosphatase 200 (H) 38 - 126 U/L   Total Bilirubin 2.0 (H) 0.3 - 1.2 mg/dL   GFR calc non Af Amer 58 (L) >60 mL/min   GFR  calc Af Amer >60 >60 mL/min    Comment: (NOTE) The eGFR has been calculated using the CKD EPI equation. This calculation has not been validated in all clinical situations. eGFR's persistently <60 mL/min signify possible Chronic Kidney Disease.    Anion gap 16 (H) 5 - 15  Protime-INR     Status: Abnormal   Collection Time: 03/24/16  8:16 AM  Result Value Ref Range   Prothrombin Time 17.9 (H) 11.4 - 15.2 seconds   INR 1.47   Type and screen     Status: None   Collection Time: 03/24/16  8:20 AM  Result Value Ref Range   ABO/RH(D) B POS    Antibody Screen NEG    Sample Expiration 03/27/2016   ABO/Rh     Status: None (Preliminary result)   Collection Time: 03/24/16  8:20 AM  Result Value Ref Range   ABO/RH(D) B POS     Dg Chest 2 View  Result Date: 03/24/2016 CLINICAL DATA:  Pre op today for TIPS,copd,liver dz,htn,,weakness today EXAM: CHEST  2 VIEW COMPARISON:  09/05/2015 FINDINGS: Lungs are hyperinflated. Heart size is normal. No focal consolidations or pleural effusions are identified. Overlying the left mid lung zone there is a 9 mm nodule. Remote rib fractures. IMPRESSION: 1. 9 mm nodule overlying the left mid lung zone. As needed, further characterization can be performed with CT of the chest. Intravenous contrast is recommended unless it is contraindicated. 2. No acute consolidation. These results will be called to the ordering clinician or representative by the Radiologist Assistant, and communication documented in the PACS or zVision Dashboard. Electronically Signed   By: Nolon Nations M.D.   On: 03/24/2016 08:51   US Paracentesis  Result Date: 03/22/2016 INDICATION: History of alcoholic cirrhosis with recurrent ascites. Request for therapeutic  paracentesis EXAM: ULTRASOUND GUIDED RIGHT LATERAL ABDOMEN PARACENTESIS MEDICATIONS: 1% Lidocaine. COMPLICATIONS: None immediate. PROCEDURE: Informed written consent was obtained from the patient after a discussion of the risks, benefits and alternatives to treatment. A timeout was performed prior to the initiation of the procedure. Initial ultrasound scanning demonstrates a large amount of ascites within the right lateral abdomen. The right lateral abdomen was prepped and draped in the usual sterile fashion. 1% lidocaine with epinephrine was used for local anesthesia. Following this, a 19 gauge, 7-cm, Yueh catheter was introduced. An ultrasound image was saved for documentation purposes. The paracentesis was performed. The catheter was removed and a dressing was applied. The patient tolerated the procedure well without immediate post procedural complication. FINDINGS: A total of approximately 2.4 liters of amber fluid was removed. IMPRESSION: Successful ultrasound-guided paracentesis yielding 2.4 liters of peritoneal fluid. Read by:  Gareth Eagle, PA-C Electronically Signed   By: Marybelle Killings M.D.   On: 03/22/2016 15:18    Review of Systems  Constitutional: Positive for malaise/fatigue.       Cachectic, weathered appearing male who appears much older than his stated age.  HENT:       Poor dentition  Eyes: Negative.   Respiratory: Positive for cough and shortness of breath. Negative for hemoptysis, sputum production and wheezing.   Cardiovascular: Positive for orthopnea and PND. Negative for palpitations and leg swelling.  Gastrointestinal: Positive for abdominal pain and nausea. Negative for blood in stool, constipation, diarrhea and melena.       Nausea and dry heaves on and off since last paracentesis 03/22/16.  Tolerating diet, NPO now, but eating what he could before admit today.  He has not been obstructed  and had BM here during the exam.  Genitourinary: Negative.   Musculoskeletal: Positive for  back pain and neck pain.  Skin:       Very suntanned appearace  Neurological: Positive for dizziness (fell a couple weeks ago and go MRI that was negative) and weakness.  Endo/Heme/Allergies: Negative for environmental allergies and polydipsia. Bruises/bleeds easily.  Psychiatric/Behavioral: Positive for depression and substance abuse (ETOH cirrhosis).   There were no vitals taken for this visit. Physical Exam  Constitutional: He is oriented to person, place, and time.  Chronically ill-appearing cachectic, weathered appearing male. He appears much older than his stated age.  HENT:  Head: Normocephalic and atraumatic.  Nose: Nose normal.  Eyes: Right eye exhibits no discharge. Left eye exhibits no discharge. No scleral icterus.  Neck: Normal range of motion. Neck supple. No JVD present. No tracheal deviation present. No thyromegaly present.  Cardiovascular: Regular rhythm and normal heart sounds.   No murmur heard. Somewhat tachycardic  Respiratory: No stridor. He has no wheezes. He has no rales. He exhibits no tenderness.  Huge barrel chest, with decreased breath sounds at the base no wheezing or rales. No tenderness.  GI: He exhibits distension. He exhibits no mass. There is tenderness. There is no rebound and no guarding.  Soft positive bowel sounds, no surgical scars. He has a huge midline umbilical hernia is about 8-10 cm in diameter. He is protruding at the umbilicus approximately 6-7 cm. The distal umbilicus is inverted pointing outward. The skin in this area is erythematous. Either has fluid or omentum directly below the umbilical hernia. We have attempted multiple times to reduce the hernia. This is not been successful. He is tolerated well. He's had multiple paracentesis he probably has more skin there would appear normal.  Musculoskeletal: He exhibits edema (trace). He exhibits no tenderness.  Lymphadenopathy:    He has no cervical adenopathy.  Neurological: He is alert and  oriented to person, place, and time. No cranial nerve deficit.  Skin: Skin is warm. No rash noted. He is not diaphoretic. No erythema. No pallor.  He has a very weathered appearance, consistent with his history of being homeless.  Psychiatric: He has a normal mood and affect. His behavior is normal. Judgment and thought content normal.    Assessment/Plan: Umbilical hernia with possible incarceration. Decompensated Liver cirrhosis/being admitted for TIPS procedure Hyponatremia Dehydration Thrombocytopenia Multiple paracentesis procedures last 03/22/16 COPD/Tobacco use Hx of ETOH abuse Hypertension GERD Depression  Plan:  He has been seen and evaluated by Dr. Dalbert Batman.  They plan to try and reduce the hernia again after he has had sedation for the TIPS procedure.  We will follow with you.     [name] 03/24/2016, 11:41 AM

## 2016-03-24 NOTE — Progress Notes (Signed)
Patient continues on ventilator, Propofol and phenyleprine gtt.  Pending Dr. Everardo Bealsornett ofCCM to make rounds and speak with brother waiting at bedside. Spoke with daughter via phone. Chrystal Pressley  [phone]   Stated she was on way to hospital

## 2016-03-24 NOTE — Transfer of Care (Signed)
Immediate Anesthesia Transfer of Care Note  Patient: [name]  Procedure(s) Performed: Procedure(s): TIPS (N/A)  Patient Location: PACU  Anesthesia Type:General  Level of Consciousness: Patient remains intubated per anesthesia plan  Airway & Oxygen Therapy: Patient remains intubated per anesthesia plan and Patient placed on Ventilator (see vital sign flow sheet for setting)  Post-op Assessment: Report given to RN and Post -op Vital signs reviewed and stable  Post vital signs: Reviewed and stable  Last Vitals:  Vitals:   03/24/16 0801 03/24/16 0849  BP: (!) 138/92   Pulse: (!) 113 (!) 101  Resp: 16   Temp: 36.3 C     Last Pain:  Vitals:   03/24/16 0844  TempSrc:   PainSc: 5       Patients Stated Pain Goal: 8 (03/24/16 0844)  Complications: No apparent anesthesia complications

## 2016-03-24 NOTE — Progress Notes (Signed)
Dr. Lindie SpruceWyatt at bedside. Evaluated hernia

## 2016-03-24 NOTE — Anesthesia Preprocedure Evaluation (Addendum)
Anesthesia Evaluation  Patient identified by MRN, date of birth, ID band Patient awake    Reviewed: Allergy & Precautions, NPO status , Patient's Chart, lab work & pertinent test results  Airway Mallampati: II  TM Distance: >3 FB Neck ROM: Full    Dental  (+) Poor Dentition, Dental Advisory Given   Pulmonary Current Smoker,    breath sounds clear to auscultation       Cardiovascular hypertension,  Rhythm:Regular Rate:Normal     Neuro/Psych    GI/Hepatic   Endo/Other    Renal/GU      Musculoskeletal   Abdominal   Peds  Hematology   Anesthesia Other Findings   Reproductive/Obstetrics                             Anesthesia Physical Anesthesia Plan  ASA: III  Anesthesia Plan: General   Post-op Pain Management:    Induction: Intravenous  Airway Management Planned: Oral ETT  Additional Equipment:   Intra-op Plan:   Post-operative Plan:   Informed Consent: I have reviewed the patients History and Physical, chart, labs and discussed the procedure including the risks, benefits and alternatives for the proposed anesthesia with the patient or authorized representative who has indicated his/her understanding and acceptance.   Dental advisory given  Plan Discussed with: CRNA and Anesthesiologist  Anesthesia Plan Comments:         Anesthesia Quick Evaluation

## 2016-03-24 NOTE — Progress Notes (Signed)
Notified Justine NullKelly Osborne,PA of pt's abnormal cxr.

## 2016-03-24 NOTE — Procedures (Addendum)
Transjugular intrahepatic porto-systemic shunt (TIPS) creation  Portosystemic mean gradient decreased from 14mmHg to 6mmHg No complication No blood loss. [facility].

## 2016-03-24 NOTE — Progress Notes (Signed)
Pharmacy Antibiotic Note  Bryce MawRobert [name] is a 55 y.o. male admitted on 03/24/2016 s/p TIPS procedure for alcoholic cirrhosis.  Pharmacy has been consulted for vancomycin and Zosyn dosing for intra-abdominal infection related to an incarcerated umbilical hernia. Plan is to go back to the OR today for repair. He had cefazolin 2 g IV pre-op at 12:25. AKI noted with SCr 1.34 (baseline <0.9).    Plan: Vancomycin 1250 mg IV once then 500 mg IV q12h Zosyn 3.375 g IV q8h to be infused over 4 hours Monitor renal function and adjust dosing as needed Monitor clinical progress, microdata, and de-escalation as able  Height: 6\' 1"  (185.4 cm) Weight: 125 lb 8 oz (56.9 kg) IBW/kg (Calculated) : 79.9  Temp (24hrs), Avg:97.3 F (36.3 C), Min:97.3 F (36.3 C), Max:97.3 F (36.3 C)   Recent Labs Lab 03/24/16 0816  WBC 11.5*  CREATININE 1.34*    Estimated Creatinine Clearance: 50.1 mL/min (by C-G formula based on SCr of 1.34 mg/dL (H)).    No Known Allergies  Antimicrobials this admission: Vancomycin 10/27 >>  Zosyn 10/27 >>   Dose adjustments this admission:   Microbiology results:   Thank you for allowing pharmacy to be a part of this patient's care.  Loura BackJennifer [place], PharmD, BCPS Clinical Pharmacist Phone for tonight [phone] Main pharmacy - [phone] 03/24/2016 4:43 PM

## 2016-03-24 NOTE — Anesthesia Preprocedure Evaluation (Signed)
Anesthesia Evaluation  Patient identified by MRN, date of birth, ID band Patient awake    Reviewed: Allergy & Precautions, NPO status , Patient's Chart, lab work & pertinent test results  Airway Mallampati: II  TM Distance: >3 FB Neck ROM: Full    Dental  (+) Poor Dentition, Dental Advisory Given   Pulmonary Current Smoker,    breath sounds clear to auscultation       Cardiovascular hypertension,  Rhythm:Regular Rate:Normal     Neuro/Psych    GI/Hepatic GERD  ,(+) Cirrhosis     substance abuse  alcohol use,   Endo/Other    Renal/GU Renal disease     Musculoskeletal   Abdominal   Peds  Hematology   Anesthesia Other Findings   Reproductive/Obstetrics                             Anesthesia Physical  Anesthesia Plan  ASA: IV  Anesthesia Plan: General   Post-op Pain Management:    Induction: Intravenous  Airway Management Planned: Oral ETT  Additional Equipment:   Intra-op Plan:   Post-operative Plan: Post-operative intubation/ventilation  Informed Consent: I have reviewed the patients History and Physical, chart, labs and discussed the procedure including the risks, benefits and alternatives for the proposed anesthesia with the patient or authorized representative who has indicated his/her understanding and acceptance.   Dental advisory given  Plan Discussed with: CRNA and Anesthesiologist  Anesthesia Plan Comments:         Anesthesia Quick Evaluation

## 2016-03-24 NOTE — Progress Notes (Signed)
PRE TIPS VENOUS PRESSURES: RT HEPATIC VEIN 11 MM PORTAL VEIN 23 MM  GRADIENT 12 MM  POST TIPS VENOUS PRESSURES  RT ATRIUM 14 MM PORTAL VEIN 20 MM  GRADIENT 6 MM

## 2016-03-24 NOTE — Anesthesia Procedure Notes (Signed)
Procedure Name: Intubation Date/Time: 03/24/2016 12:10 PM Performed by: Sharlene DoryWALKER, Levin Dagostino E Pre-anesthesia Checklist: Patient identified, Emergency Drugs available, Suction available and Patient being monitored Patient Re-evaluated:Patient Re-evaluated prior to inductionOxygen Delivery Method: Circle system utilized Preoxygenation: Pre-oxygenation with 100% oxygen Intubation Type: IV induction, Rapid sequence and Cricoid Pressure applied Ventilation: Mask ventilation without difficulty Laryngoscope Size: Mac and 4 Grade View: Grade I Tube type: Subglottic suction tube Tube size: 7.5 mm Number of attempts: 1 Airway Equipment and Method: Stylet Placement Confirmation: ETT inserted through vocal cords under direct vision,  positive ETCO2 and breath sounds checked- equal and bilateral Secured at: 22 cm Tube secured with: Tape Dental Injury: Teeth and Oropharynx as per pre-operative assessment

## 2016-03-24 NOTE — Consult Note (Signed)
PULMONARY / CRITICAL CARE MEDICINE   Name: [name] MRN: [id number] DOB: 13-Oct-1960    ADMISSION DATE:  03/24/2016 CONSULTATION DATE:  03/24/16  REFERRING MD:  Dyanne Carrel, NP-C / TRH   CHIEF COMPLAINT:  ETOH cirrhosis s/p TIPS, incarcerated umbilical hernia   HISTORY OF PRESENT ILLNESS:   55 y/o M, previously homeless (living with his brother currently), followed at [place] with a PMH of depression, GERD, constipation, hypertension, COPD and ETOH cirrhosis with persistent ascites requiring weekly paracentesis who presented to [facility] on 10/27 for planned TIPS procedure.    On arrival for procedure, labs were assessed and showed acute kidney injury (sr cr up to 1.34 from 0.53), hyponatremia (125), hypokalemia (3.3), glucose 152, alk phos 200, albumin 3.0, AST 107 / ALT 47, WBC 11.5, Hgb 17.6, platelets 143 and INR 1.47.  CXR showed a 22m pulmonary nodule on the left, no acute consolidation. The patient had previously been followed by hospice but was taken off for TIPS procedure.  Per admit notes, he reported increased abdominal pain after his last paracentesis (10/27), difficulty initiating flow with urination / incomplete emptying and his umbilical hernia seemed "bigger" than usual.  [facility].  Plan of care was to proceed with TIPS procedure 10/27.  He was electively intubated per anesthesia for the procedure.  During the imaging assessment for TIPS, it was discovered that he had an incarcerated hernia.  CCS was consulted for evaluation of incarcerated hernia.  It was anticipated that he will return post procedure to ICU on mechanical ventilation, PCCM consulted.    PAST MEDICAL HISTORY :  Past Medical History:  Diagnosis Date  . Cirrhosis of liver ([place]   . Constipation   . COPD (ch[place] obstructive pulmonary disease) ([place]   . Depression   . Dyspnea   . ETOH abuse   . GERD (gastroesophageal reflux disease)   .  Hypertension     PAST SURGICAL HISTORY: Past Surgical History:  Procedure Laterality Date  . MANDIBLE FRACTURE SURGERY  30 yrs. ago   pt. states he has a steele plate  . NO PAST SURGERIES     jaw surgery [id number]    No Known Allergies  No current facility-administered medications on file prior to encounter.    Current Outpatient Prescriptions on File Prior to Encounter  Medication Sig  . albuterol (PROVENTIL HFA;VENTOLIN HFA) 108 (90 Base) MCG/ACT inhaler Inhale 2 puffs into the lungs every 6 (six) hours as needed for wheezing or shortness of breath.  . citalopram (CELEXA) 20 MG tablet Take 1 tablet (20 mg total) by mouth daily. (Patient taking differently: Take 20 mg by mouth every evening. )  . diclofenac sodium (VOLTAREN) 1 % GEL Apply 4 g topically 4 (four) times daily. (Patient taking differently: Apply 4 g topically 4 (four) times daily as needed (for pain). )  . furosemide (LASIX) 20 MG tablet TAKE 1 TABLET BY MOUTH DAILY. (Patient taking differently: TAKE 1 TABLET BY MOUTH DAILY IN THE AFTERNOON)  . lactulose (CHRONULAC) 10 GM/15ML solution Take 15 mLs (10 g total) by mouth 2 (two) times daily.  . potassium chloride SA (K-DUR,KLOR-CON) 20 MEQ tablet Take 1 tablet (20 mEq total) by mouth daily. (Patient taking differently: Take 20 mEq by mouth every evening. )  . ranitidine (ZANTAC) 150 MG tablet Take 1 tablet (150 mg total) by mouth 2 (two) times daily.  . Marland Kitchenpironolactone (ALDACTONE) 50 MG tablet Take 1 tablet (50  mg total) by mouth daily.    FAMILY HISTORY:  Family History  Problem Relation Age of Onset  . Diabetes Mother     SOCIAL HISTORY: Social History   Social History  . Marital status: Single    Spouse name: N/A  . Number of children: N/A  . Years of education: N/A   Social History Main Topics  . Smoking status: Current Every Day Smoker    Packs/day: 0.25    Years: 40.00    Types: Cigarettes  . Smokeless tobacco: Never Used  . Alcohol use No      Comment: 05/28/15-states still not drinking  . Drug use: No  . Sexual activity: No   Other Topics Concern  . None   Social History Narrative  . None    REVIEW OF SYSTEMS:  Unable to complete as patient is altered on mechanical ventilation.  Information obtained from previous medical documentation.  SUBJECTIVE: As above.  VITAL SIGNS: BP (!) 138/92   Pulse (!) 101   Temp 97.3 F (36.3 C) (Oral)   Resp 16   Ht _0  (1.854 m)   Wt 125 lb 8 oz (56.9 kg)   SpO2 99%   BMI 16.56 kg/m   HEMODYNAMICS:    VENTILATOR SETTINGS:    INTAKE / OUTPUT: No intake/output data recorded.  PHYSICAL EXAMINATION: General: Patient sedated and in postanesthesia care unit. No distress. Nurse at bedside. Integument:  Warm & dry. No rash on exposed skin. Tattoos noted. Erythema overlying umbilical hernia. Lymphatics:  No appreciated cervical or supraclavicular lymphadenoapthy. HEENT:  No scleral injection or icterus. Endotracheal tube in place.  Cardiovascular:  Regular rate and rhythm. No edema. No appreciable JVD.  Pulmonary:  Clear bilaterally to auscultation. Symmetric chest wall rise on ventilator. Abdomen: Soft. Normal bowel sounds. Umbilical hernia appreciated that is nonreducible.  Musculoskeletal:  Normal bulk and tone. No joint deformity or effusion appreciated. Neurological:  Patient sedated on propofol. Pupils are dilated and symmetric. Pupillary reflex present. No spontaneous movements.  Psychiatric:  Unable to assess given sedation.   LABS:  BMET  Recent Labs Lab 03/24/16 0816  NA 125*  K 3.3*  CL 83*  CO2 26  BUN 9  CREATININE 1.34*  GLUCOSE 152*    Electrolytes  Recent Labs Lab 03/24/16 0816  CALCIUM 8.9    CBC  Recent Labs Lab 03/24/16 0816  WBC 11.5*  HGB 17.6*  HCT 47.8  PLT 143*    Coag's  Recent Labs Lab 03/24/16 0816  APTT 38*  INR 1.47    Sepsis Markers No results for input(s): LATICACIDVEN, PROCALCITON, O2SATVEN in the last 168  hours.  ABG No results for input(s): PHART, PCO2ART, PO2ART in the last 168 hours.  Liver Enzymes  Recent Labs Lab 03/24/16 0816  AST 107*  ALT 47  ALKPHOS 200*  BILITOT 2.0*  ALBUMIN 3.0*    Cardiac Enzymes No results for input(s): TROPONINI, PROBNP in the last 168 hours.  Glucose No results for input(s): GLUCAP in the last 168 hours.  Imaging Dg Chest 2 View  Result Date: 03/24/2016 CLINICAL DATA:  Pre op today for TIPS,copd,liver dz,htn,,weakness today EXAM: CHEST  2 VIEW COMPARISON:  09/05/2015 FINDINGS: Lungs are hyperinflated. Heart size is normal. No focal consolidations or pleural effusions are identified. Overlying the left mid lung zone there is a 9 mm nodule. Remote rib fractures. IMPRESSION: 1. 9 mm nodule overlying the left mid lung zone. As needed, further characterization can be performed with CT of  the chest. Intravenous contrast is recommended unless it is contraindicated. 2. No acute consolidation. These results will be called to the ordering clinician or representative by the Radiologist Assistant, and communication documented in the PACS or zVision Dashboard. Electronically Signed   By: Nolon Nations M.D.   On: 03/24/2016 08:51     STUDIES:  CT CHEST/ABD/PELVIS W/O 10/27: IMPRESSION: 1. No suspicious pulmonary nodule. There is a small calcified granuloma in the lingula. 2. Umbilical hernia containing fluid-filled small bowel. There is proximal small bowel dilatation with air-fluid levels, supporting the presence of an incarcerated hernia. 3. Moderate ascites, attributed to underlying liver disease. 4. T12 superior endplate compression fracture with osseous retropulsion, potentially subacute. 5. Indeterminate 3.1 cm left renal lesion, measuring higher than water density. Statistically, this is a complex cyst. 6. Additional incidental findings including emphysema, bilateral rib fractures, hepatic steatosis, diffuse atherosclerosis and bladder wall  thickening.  MICROBIOLOGY: MRSA PCR 10/27 >> Blood Ctx x2 10/27 >>  ANTIBIOTICS: Vancomycin 10/27 >> Zosyn 10/27 >>  SIGNIFICANT EVENTS: 10/03 - Paracentesis 4.1L yellow fluid 10/10 - Paracentesis w/ 4L amber fluid 10/16 - Paracentesis w/ 4L yellow fluid 10/25 - Paracentesis w/ 2.4L amber fluid 10/27 - Admit for TIPS, AKI, found to have incarcerated hernia / s/p TIPS  LINES/TUBES: OETT 7.5 10/27 >>  R IJ CVL (single lumen post TIPS) 10/27 >> L Radial Art Line 10/27 >> OGT 10/27 >> Foley 10/27 >> PIV x2   DISCUSSION: 55 y/o M with PMH of ETOH cirrhosis, previously on hospice, presented 10/27 for planned TIPS.  Ultimately admitted for acute renal failure.  Found to have incarcerated hernia during imaging for TIPS.  CCS evaluated, pending decision regarding surgical intervention. Starting broad-spectrum antibiotics. Have attempted to contact family but unsuccessful.  ASSESSMENT / PLAN:  PULMONARY A: H/O COPD - Emphysema seen on CT scan today. Tobacco Abuse   P:   Full Vent Support with PRVC at 8 cc/kg ideal body weight Wean PEEP / FiO2 for sats >92% Follow up CXR post procedure  ABG post intubation Albuterol nebs prn Tobacco cessation education prior to discharge  CARDIOVASCULAR A:  Shock - Resolving. Likely due to pre-renal state versus spesis. H/O HTN  P:  Continuous Telemetry Monitoring Vitals per unit protocol Weaning Neo-Synephrine for MAP >65 & SBP >90 Holding home Lasix & Aldactone Checking Echocardiogram Trending Troponin I q6hr  RENAL A:   Acute Renal Failure - Possible Hepato-Renal Syndrome Hypokalemia - Mild.  P:   Trending UOP with Foley Monitoring renal function & electrolytes daily Replacing electrolytes as indicated LR @ 50cc/hr already ordered Renal U/S Pending  GASTROINTESTINAL A:   ETOH Cirrhosis w/ Ascites - Requiring weekly paracentesis. Last paracentesis 10/25. Incarcerated Umbilical Hernia  Transaminitis H/O GERD  P:   CT  scan post TIPS  Surgery Service to assess patient for correction of hernia NPO Pepcid IV q24hr Repeat LFTs in AM along with Coags for synthetic function  HEMATOLOGIC A:   Leukocytosis - Likely early sepsis versus demargination. Thrombocytopenia - Mild.  Coagulopathy - Likely some element of synthetic dysfunction w/ INR 1.47 & Albumin 2.1.  P:  Trending cell counts daily w/ CBC SCDs Heparin Radar Base q8hr Repeat Coags in AM  INFECTIOUS A:   Possible Sepsis - From strangulated umbilical hernia. Possible SBP - Multiple paracenteses.   P:   Checking Blood Cultures x2 Consult to Pharmacy for Empiric Vancomycin & Zosyn Trending Procalcitonin per Algorithm   ENDOCRINE A:   Hyperglycemia - No h/o DM. Risk  for Hypoglycemia  P:   Accu-Checks q4hr w/ MD notification parameters Checking Hgb A1c & Cortisol  NEUROLOGIC A:   Sedation on Ventilator ETOH Abuse H/O Depression   P:   RASS goal: 0 to -1  Propofol gtt Versed IV prn sedation Fentanyl IV prn pain/discomfort Thiamine & Folic Acid IV daily CIWA Protocol once extubated Holding home Lactulose & Celexa  FAMILY  - Updates:  No family at bedside in PACU. Attempted to call daughter Dolores Frame and left a message to call the PACU to have family present for family discussion. Called surgical waiting area but no one was waiting for the patient.   - Inter-disciplinary family meet or Palliative Care meeting due by:  11/03  I have spent a total of 39 minutes of critical care time today caring for the patient and reviewing the patient's electronic medical record.   Sonia Baller Ashok Cordia, M.D. [facility] Pulmonary & Critical Care Pager:  [phone] After 3pm or if no response, call [phone] 4:28 PM 03/24/16

## 2016-03-24 NOTE — H&P (Signed)
Chief Complaint: cirrhosis  Referring Physician: Dr. Arnoldo Morale  Supervising Physician: Arne Cleveland  Patient Status: [facility] - Out-pt  HPI: [name] is an 55 y.o. male who is well-known to IR for weekly paracenteses secondary to alcoholic cirrhosis.  He was initially on hospice care for this but his PCP referred him to IR for evaluation of a TIPS.  He saw Dr. Vernard Gambles recently and has been set up for this procedure.  He came in Wednesday for a para and had 2L removed.  Since then he has been having abdominal pain.  He has been having some nausea for the last 2 days as well.  He just had a BM while here.     Past Medical History:  Past Medical History:  Diagnosis Date  . Cirrhosis of liver ([place])   . Constipation   . COPD (chronic obstructive pulmonary disease) ([place])   . Depression   . Dyspnea   . ETOH abuse   . GERD (gastroesophageal reflux disease)   . Hypertension     Past Surgical History:  Past Surgical History:  Procedure Laterality Date  . MANDIBLE FRACTURE SURGERY  30 yrs. ago   pt. states he has a steele plate  . NO PAST SURGERIES     jaw surgery 33yr ago    Family History:  Family History  Problem Relation Age of Onset  . Diabetes Mother     Social History:  reports that he has been smoking Cigarettes.  He has a 10.00 pack-year smoking history. He has never used smokeless tobacco. He reports that he does not drink alcohol or use drugs.  Allergies: No Known Allergies  Medications: Medications reviewed in Epic  Please HPI for pertinent positives, otherwise complete 10 system ROS negative.  Mallampati Score: MD Evaluation Airway: WNL Heart: WNL Abdomen: Other (comments) Abdomen comments: incarcerated umbilical hernia Chest/ Lungs: WNL ASA  Classification: 3  Physical Exam:   Temp (F)   97.3  97.3 (36.3)  10/27 0801  Pulse Rate      101-113  101  10/27 0849  Resp   16  16  10/27 0801  BP   138/92  138/92  10/27 0801  SpO2 (%)   99   99  10/27 0801  Weight (lb)   125-161  125 lb 8 oz (56.9 kg)  10    General: pleasant, WD, WN white male who is laying in bed in NAD HEENT: head is normocephalic, atraumatic.  Sclera are noninjected.  PERRL.  Ears and nose without any masses or lesions.  Mouth is pink and moist, but with most teeth missing and otherwise poor dentition Heart: regular, rate, and rhythm.  Normal s1,s2. No obvious murmurs, gallops, or rubs noted.  Palpable radial and pedal pulses bilaterally Lungs: CTAB, no wheezes, rhonchi, or rales noted.  Respiratory effort nonlabored Abd: soft, tender over umbilical hernia, ND, hypoactive BS, no masses or organomegaly.  He has a chronic umbilical hernia usually with just fluid; however today this is much larger with contents present.  His skin is thicker and is more acutely erythematous.  This is tender with palpation and he dry heaves with manipulation MS: all 4 extremities are symmetrical with no cyanosis, clubbing, or edema. Psych: A&Ox3 with an appropriate affect.   Labs: Results for orders placed or performed during the hospital encounter of 03/24/16 (from the past 48 hour(s))  APTT     Status: Abnormal   Collection Time: 03/24/16  8:16 AM  Result Value Ref Range   aPTT 38 (H) 24 - 36 seconds    Comment:        IF BASELINE aPTT IS ELEVATED, SUGGEST PATIENT RISK ASSESSMENT BE USED TO DETERMINE APPROPRIATE ANTICOAGULANT THERAPY.   CBC with Differential/Platelet     Status: Abnormal   Collection Time: 03/24/16  8:16 AM  Result Value Ref Range   WBC 11.5 (H) 4.0 - 10.5 K/uL   RBC 5.38 4.22 - 5.81 MIL/uL   Hemoglobin 17.6 (H) 13.0 - 17.0 g/dL   HCT 47.8 39.0 - 52.0 %   MCV 88.8 78.0 - 100.0 fL   MCH 88.8 (H) 26.0 - 34.0 pg   MCHC 36.8 (H) 30.0 - 36.0 g/dL   RDW 13.9 11.5 - 15.5 %   Platelets 143 (L) 150 - 400 K/uL   Neutrophils Relative % 77 %   Neutro Abs 8.9 (H) 1.7 - 7.7 K/uL   Lymphocytes Relative 12 %   Lymphs Abs 1.4 0.7 - 4.0 K/uL   Monocytes Relative  11 %   Monocytes Absolute 1.3 (H) 0.1 - 1.0 K/uL   Eosinophils Relative 0 %   Eosinophils Absolute 0.0 0.0 - 0.7 K/uL   Basophils Relative 0 %   Basophils Absolute 0.0 0.0 - 0.1 K/uL  Comprehensive metabolic panel     Status: Abnormal   Collection Time: 03/24/16  8:16 AM  Result Value Ref Range   Sodium 125 (L) 135 - 145 mmol/L   Potassium 3.3 (L) 3.5 - 5.1 mmol/L   Chloride 83 (L) 101 - 111 mmol/L   CO2 26 22 - 32 mmol/L   Glucose, Bld 152 (H) 65 - 99 mg/dL   BUN 9 6 - 20 mg/dL   Creatinine, Ser 1.34 (H) 0.61 - 1.24 mg/dL   Calcium 8.9 8.9 - 10.3 mg/dL   Total Protein 7.8 6.5 - 8.1 g/dL   Albumin 3.0 (L) 3.5 - 5.0 g/dL   AST 107 (H) 15 - 41 U/L   ALT 47 17 - 63 U/L   Alkaline Phosphatase 200 (H) 38 - 126 U/L   Total Bilirubin 2.0 (H) 0.3 - 1.2 mg/dL   GFR calc non Af Amer 58 (L) >60 mL/min   GFR calc Af Amer >60 >60 mL/min    Comment: (NOTE) The eGFR has been calculated using the CKD EPI equation. This calculation has not been validated in all clinical situations. eGFR's persistently <60 mL/min signify possible Chronic Kidney Disease.    Anion gap 16 (H) 5 - 15  Protime-INR     Status: Abnormal   Collection Time: 03/24/16  8:16 AM  Result Value Ref Range   Prothrombin Time 17.9 (H) 11.4 - 15.2 seconds   INR 1.47   Type and screen     Status: None   Collection Time: 03/24/16  8:20 AM  Result Value Ref Range   ABO/RH(D) B POS    Antibody Screen NEG    Sample Expiration 03/27/2016   ABO/Rh     Status: None (Preliminary result)   Collection Time: 03/24/16  8:20 AM  Result Value Ref Range   ABO/RH(D) B POS     Imaging: Dg Chest 2 View  Result Date: 03/24/2016 CLINICAL DATA:  Pre op today for TIPS,copd,liver dz,htn,,weakness today EXAM: CHEST  2 VIEW COMPARISON:  09/05/2015 FINDINGS: Lungs are hyperinflated. Heart size is normal. No focal consolidations or pleural effusions are identified. Overlying the left mid lung zone there is a 9 mm nodule. Remote rib fractures.  IMPRESSION: 1. 9 mm nodule overlying the left mid lung zone. As needed, further characterization can be performed with CT of the chest. Intravenous contrast is recommended unless it is contraindicated. 2. No acute consolidation. These results will be called to the ordering clinician or representative by the Radiologist Assistant, and communication documented in the PACS or zVision Dashboard. Electronically Signed   By: Nolon Nations M.D.   On: 03/24/2016 08:51   US Paracentesis  Result Date: 03/22/2016 INDICATION: History of alcoholic cirrhosis with recurrent ascites. Request for therapeutic paracentesis EXAM: ULTRASOUND GUIDED RIGHT LATERAL ABDOMEN PARACENTESIS MEDICATIONS: 1% Lidocaine. COMPLICATIONS: None immediate. PROCEDURE: Informed written consent was obtained from the patient after a discussion of the risks, benefits and alternatives to treatment. A timeout was performed prior to the initiation of the procedure. Initial ultrasound scanning demonstrates a large amount of ascites within the right lateral abdomen. The right lateral abdomen was prepped and draped in the usual sterile fashion. 1% lidocaine with epinephrine was used for local anesthesia. Following this, a 19 gauge, 7-cm, Yueh catheter was introduced. An ultrasound image was saved for documentation purposes. The paracentesis was performed. The catheter was removed and a dressing was applied. The patient tolerated the procedure well without immediate post procedural complication. FINDINGS: A total of approximately 2.4 liters of amber fluid was removed. IMPRESSION: Successful ultrasound-guided paracentesis yielding 2.4 liters of peritoneal fluid. Read by:  Gareth Eagle, PA-C Electronically Signed   By: Marybelle Killings M.D.   On: 03/22/2016 15:18    Assessment/Plan 1. ETOH cirrhosis 2. Incarcerated umbilical hernia  -the patient presents today with a newly incarcerated umbilical hernia.  Multiple attempts have been made to reduce this  hernia unsuccessfully.  I have contact general surgery to assess the patient prior to our procedure.  They have recommended we take him down and put him under anesthesia and then contact him for reduction.  If he can reduce this, then we proceed with his TIPS and no other intervention will be needed.  If he can not reduce it, then we will proceed with his TIPS, then likely get a CT scan, and possible OR today for repair.  I have discussed this with Dr. Vernard Gambles who is also in agreement with this plan. -the hospitalist service has also been contacted given the multitude of medical problems this patient has.  They are going to admit the patient to their service.  I will contact them to update them on this situation. -he has also been noted on his CXR to have a new 49m pulmonary nodule.  This will need to be addressed as an outpatient.   Thank you for this interesting consult.  I greatly enjoyed meeting [name] look forward to participating in their care.  A copy of this report was sent to the requesting provider on this date.  Electronically Signed: OHenreitta Cea10/27/2017, 11:42 AM   I spent a total of    40 Minutes in face to face in clinical consultation, greater than 50% of which was counseling/coordinating care for cirrhosis, incarcerated umbilical hernia

## 2016-03-24 NOTE — Progress Notes (Signed)
Family desires repair of incarcerated umbilical hernia with SB and omentum High risk of dying from surgical intervention but unable to reduce and skin break down noted   The risk of hernia repair include bleeding,  Infection,   Recurrence of the hernia,  Mesh use, chronic pain,  Organ injury,  Bowel injury,  Bladder injury,   nerve injury with numbness around the incision,  Death,  and worsening of preexisting  medical problems.  The alternatives to surgery have been discussed as well..  Long term expectations of both operative and non operative treatments have been discussed.   The patient agrees to proceed. 

## 2016-03-24 NOTE — Progress Notes (Addendum)
Received from CT . Pt on Ventilator rate 16,Vt 560,100 % FI02, peep 5. Oral ETT. Pt unresponsive. Non reversed from Anesthesia. Propofol infusing 4550mcq.kg/min.  Phenylephrine (40 Mcg/ml) infusing 25 mcq/min. Plan explained from anes. To return to OR for release of incarcerated hernia

## 2016-03-24 NOTE — Anesthesia Postprocedure Evaluation (Signed)
Anesthesia Post Note  Patient: Bryce MawRobert [name]  Procedure(s) Performed: Procedure(s) (LRB): TIPS (N/A)  Patient location during evaluation: SICU Anesthesia Type: General Level of consciousness: sedated and patient remains intubated per anesthesia plan Pain management: pain level controlled Vital Signs Assessment: post-procedure vital signs reviewed and stable Respiratory status: patient remains intubated per anesthesia plan and patient on ventilator - see flowsheet for VS Cardiovascular status: blood pressure returned to baseline and stable Anesthetic complications: no    Last Vitals:  Vitals:   03/24/16 2015 03/24/16 2022  BP: (!) 82/54   Pulse: 80 80  Resp: 16 16  Temp:      Last Pain:  Vitals:   03/24/16 0844  TempSrc:   PainSc: 5                  Dianelly Ferran COKER

## 2016-03-24 NOTE — OR Nursing (Signed)
Critical trop I 0.04.  Dr. Gentry RochJudd notified.

## 2016-03-25 ENCOUNTER — Other Ambulatory Visit (HOSPITAL_COMMUNITY): Payer: Self-pay

## 2016-03-25 ENCOUNTER — Observation Stay (HOSPITAL_BASED_OUTPATIENT_CLINIC_OR_DEPARTMENT_OTHER): Payer: Medicaid Other

## 2016-03-25 ENCOUNTER — Encounter (HOSPITAL_COMMUNITY)
Admission: RE | Disposition: A | Discharge status: Home Health | Payer: Self-pay | Source: Ambulatory Visit | Attending: Internal Medicine

## 2016-03-25 ENCOUNTER — Observation Stay (HOSPITAL_COMMUNITY): Payer: Medicaid Other

## 2016-03-25 ENCOUNTER — Observation Stay (HOSPITAL_COMMUNITY): Payer: Medicaid Other | Admitting: Anesthesiology

## 2016-03-25 DIAGNOSIS — Z833 Family history of diabetes mellitus: Secondary | ICD-10-CM | POA: Diagnosis not present

## 2016-03-25 DIAGNOSIS — N170 Acute kidney failure with tubular necrosis: Secondary | ICD-10-CM | POA: Diagnosis present

## 2016-03-25 DIAGNOSIS — E875 Hyperkalemia: Secondary | ICD-10-CM | POA: Diagnosis present

## 2016-03-25 DIAGNOSIS — D689 Coagulation defect, unspecified: Secondary | ICD-10-CM | POA: Diagnosis not present

## 2016-03-25 DIAGNOSIS — R6521 Severe sepsis with septic shock: Secondary | ICD-10-CM | POA: Diagnosis not present

## 2016-03-25 DIAGNOSIS — S22089A Unspecified fracture of T11-T12 vertebra, initial encounter for closed fracture: Secondary | ICD-10-CM | POA: Diagnosis present

## 2016-03-25 DIAGNOSIS — K219 Gastro-esophageal reflux disease without esophagitis: Secondary | ICD-10-CM | POA: Diagnosis present

## 2016-03-25 DIAGNOSIS — E46 Unspecified protein-calorie malnutrition: Secondary | ICD-10-CM | POA: Diagnosis present

## 2016-03-25 DIAGNOSIS — K7031 Alcoholic cirrhosis of liver with ascites: Secondary | ICD-10-CM | POA: Diagnosis not present

## 2016-03-25 DIAGNOSIS — E876 Hypokalemia: Secondary | ICD-10-CM | POA: Diagnosis not present

## 2016-03-25 DIAGNOSIS — F329 Major depressive disorder, single episode, unspecified: Secondary | ICD-10-CM | POA: Diagnosis present

## 2016-03-25 DIAGNOSIS — R579 Shock, unspecified: Secondary | ICD-10-CM

## 2016-03-25 DIAGNOSIS — D696 Thrombocytopenia, unspecified: Secondary | ICD-10-CM | POA: Diagnosis not present

## 2016-03-25 DIAGNOSIS — J9621 Acute and chronic respiratory failure with hypoxia: Secondary | ICD-10-CM | POA: Diagnosis not present

## 2016-03-25 DIAGNOSIS — N179 Acute kidney failure, unspecified: Secondary | ICD-10-CM | POA: Diagnosis present

## 2016-03-25 DIAGNOSIS — G9340 Encephalopathy, unspecified: Secondary | ICD-10-CM | POA: Diagnosis not present

## 2016-03-25 DIAGNOSIS — A419 Sepsis, unspecified organism: Secondary | ICD-10-CM | POA: Diagnosis not present

## 2016-03-25 DIAGNOSIS — I10 Essential (primary) hypertension: Secondary | ICD-10-CM | POA: Diagnosis present

## 2016-03-25 DIAGNOSIS — R911 Solitary pulmonary nodule: Secondary | ICD-10-CM | POA: Diagnosis not present

## 2016-03-25 DIAGNOSIS — E43 Unspecified severe protein-calorie malnutrition: Secondary | ICD-10-CM | POA: Diagnosis present

## 2016-03-25 DIAGNOSIS — R339 Retention of urine, unspecified: Secondary | ICD-10-CM | POA: Diagnosis present

## 2016-03-25 DIAGNOSIS — K7460 Unspecified cirrhosis of liver: Secondary | ICD-10-CM | POA: Diagnosis not present

## 2016-03-25 DIAGNOSIS — F10188 Alcohol abuse with other alcohol-induced disorder: Secondary | ICD-10-CM | POA: Diagnosis present

## 2016-03-25 DIAGNOSIS — E871 Hypo-osmolality and hyponatremia: Secondary | ICD-10-CM | POA: Diagnosis present

## 2016-03-25 DIAGNOSIS — K652 Spontaneous bacterial peritonitis: Secondary | ICD-10-CM | POA: Diagnosis not present

## 2016-03-25 DIAGNOSIS — J9611 Chronic respiratory failure with hypoxia: Secondary | ICD-10-CM | POA: Diagnosis not present

## 2016-03-25 DIAGNOSIS — F17200 Nicotine dependence, unspecified, uncomplicated: Secondary | ICD-10-CM | POA: Diagnosis present

## 2016-03-25 DIAGNOSIS — K420 Umbilical hernia with obstruction, without gangrene: Secondary | ICD-10-CM | POA: Diagnosis present

## 2016-03-25 DIAGNOSIS — Z720 Tobacco use: Secondary | ICD-10-CM | POA: Diagnosis not present

## 2016-03-25 HISTORY — PX: UMBILICAL HERNIA REPAIR: SHX196

## 2016-03-25 LAB — COMPREHENSIVE METABOLIC PANEL
ALBUMIN: 2.7 g/dL — AB (ref 3.5–5.0)
ALK PHOS: 114 U/L (ref 38–126)
ALT: 33 U/L (ref 17–63)
ANION GAP: 7 (ref 5–15)
AST: 114 U/L — ABNORMAL HIGH (ref 15–41)
BUN: 6 mg/dL (ref 6–20)
CALCIUM: 7.4 mg/dL — AB (ref 8.9–10.3)
CHLORIDE: 94 mmol/L — AB (ref 101–111)
CO2: 28 mmol/L (ref 22–32)
Creatinine, Ser: 0.6 mg/dL — ABNORMAL LOW (ref 0.61–1.24)
GFR calc Af Amer: >60 mL/min (ref >60)
GFR calc non Af Amer: >60 mL/min (ref >60)
GLUCOSE: 132 mg/dL — AB (ref 65–99)
Potassium: 2.9 mmol/L — ABNORMAL LOW (ref 3.5–5.1)
SODIUM: 129 mmol/L — AB (ref 135–145)
Total Bilirubin: 2.8 mg/dL — ABNORMAL HIGH (ref 0.3–1.2)
Total Protein: 5.4 g/dL — ABNORMAL LOW (ref 6.5–8.1)

## 2016-03-25 LAB — TRIGLYCERIDES: TRIGLYCERIDES: 66 mg/dL (ref <150)

## 2016-03-25 LAB — SODIUM, URINE, RANDOM

## 2016-03-25 LAB — CBC WITH DIFFERENTIAL/PLATELET
Basophils Absolute: 0 10*3/uL (ref 0.0–0.1)
Basophils Relative: 0 %
EOS PCT: 1 %
Eosinophils Absolute: 0.1 10*3/uL (ref 0.0–0.7)
HEMATOCRIT: 38.7 % — AB (ref 39.0–52.0)
HEMOGLOBIN: 13.2 g/dL (ref 13.0–17.0)
LYMPHS ABS: 1.3 10*3/uL (ref 0.7–4.0)
LYMPHS PCT: 14 %
MCH: 31.5 pg (ref 26.0–34.0)
MCHC: 34.1 g/dL (ref 30.0–36.0)
MCV: 92.4 fL (ref 78.0–100.0)
Monocytes Absolute: 0.7 10*3/uL (ref 0.1–1.0)
Monocytes Relative: 7 %
NEUTROS ABS: 7.5 10*3/uL (ref 1.7–7.7)
NEUTROS PCT: 77 %
Platelets: 67 10*3/uL — ABNORMAL LOW (ref 150–400)
RBC: 4.19 MIL/uL — AB (ref 4.22–5.81)
RDW: 14.4 % (ref 11.5–15.5)
WBC: 9.7 10*3/uL (ref 4.0–10.5)

## 2016-03-25 LAB — GLUCOSE, CAPILLARY
Glucose-Capillary: 102 mg/dL — ABNORMAL HIGH (ref 65–99)
Glucose-Capillary: 118 mg/dL — ABNORMAL HIGH (ref 65–99)
Glucose-Capillary: 135 mg/dL — ABNORMAL HIGH (ref 65–99)
Glucose-Capillary: 136 mg/dL — ABNORMAL HIGH (ref 65–99)

## 2016-03-25 LAB — TROPONIN I
TROPONIN I: 0.03 ng/mL — AB (ref <0.03)
Troponin I: 0.05 ng/mL (ref <0.03)

## 2016-03-25 LAB — PHOSPHORUS: Phosphorus: 2.3 mg/dL — ABNORMAL LOW (ref 2.5–4.6)

## 2016-03-25 LAB — AMMONIA: Ammonia: 102 umol/L — ABNORMAL HIGH (ref 9–35)

## 2016-03-25 LAB — HEMOGLOBIN A1C
HEMOGLOBIN A1C: 4.7 % — AB (ref 4.8–5.6)
MEAN PLASMA GLUCOSE: 88 mg/dL

## 2016-03-25 LAB — ECHOCARDIOGRAM COMPLETE
Height: 73 in
Weight: 2268.09 oz

## 2016-03-25 LAB — LACTIC ACID, PLASMA: LACTIC ACID, VENOUS: 2.1 mmol/L — AB (ref 0.5–1.9)

## 2016-03-25 LAB — PROTIME-INR
INR: 1.88
Prothrombin Time: 21.8 seconds — ABNORMAL HIGH (ref 11.4–15.2)

## 2016-03-25 LAB — FIBRINOGEN: FIBRINOGEN: 195 mg/dL — AB (ref 210–475)

## 2016-03-25 LAB — PROCALCITONIN: Procalcitonin: 0.62 ng/mL

## 2016-03-25 LAB — MAGNESIUM: Magnesium: 1.5 mg/dL — ABNORMAL LOW (ref 1.7–2.4)

## 2016-03-25 LAB — APTT: APTT: 49 s — AB (ref 24–36)

## 2016-03-25 SURGERY — REPAIR, HERNIA, UMBILICAL, ADULT
Anesthesia: General | Site: Abdomen

## 2016-03-25 MED ORDER — MAGNESIUM SULFATE 4 GM/100ML IV SOLN
4.0000 g | Freq: Once | INTRAVENOUS | Status: AC
  Administered 2016-03-25: 4 g via INTRAVENOUS
  Filled 2016-03-25: qty 100

## 2016-03-25 MED ORDER — ARTIFICIAL TEARS OP OINT
TOPICAL_OINTMENT | OPHTHALMIC | Status: DC | PRN
Start: 2016-03-25 — End: 2016-03-25
  Administered 2016-03-25: 1 via OPHTHALMIC

## 2016-03-25 MED ORDER — ARTIFICIAL TEARS OP OINT
TOPICAL_OINTMENT | OPHTHALMIC | Status: AC
  Filled 2016-03-25: qty 7

## 2016-03-25 MED ORDER — DEXTROSE 5 % IV SOLN
0.0000 ug/min | INTRAVENOUS | Status: DC
  Administered 2016-03-25: 110 ug/min via INTRAVENOUS
  Filled 2016-03-25 (×2): qty 4

## 2016-03-25 MED ORDER — PROTAMINE SULFATE 10 MG/ML IV SOLN
INTRAVENOUS | Status: AC
  Filled 2016-03-25: qty 5

## 2016-03-25 MED ORDER — BUPIVACAINE HCL (PF) 0.25 % IJ SOLN
INTRAMUSCULAR | Status: AC
  Filled 2016-03-25: qty 10

## 2016-03-25 MED ORDER — ORAL CARE MOUTH RINSE
15.0000 mL | OROMUCOSAL | Status: DC
  Administered 2016-03-25 (×4): 15 mL via OROMUCOSAL

## 2016-03-25 MED ORDER — LIDOCAINE 2% (20 MG/ML) 5 ML SYRINGE
INTRAMUSCULAR | Status: AC
  Filled 2016-03-25: qty 5

## 2016-03-25 MED ORDER — LACTATED RINGERS IV SOLN
INTRAVENOUS | Status: DC | PRN
  Administered 2016-03-25: 02:00:00 via INTRAVENOUS

## 2016-03-25 MED ORDER — PHENYLEPHRINE HCL 10 MG/ML IJ SOLN
INTRAVENOUS | Status: DC | PRN
  Administered 2016-03-25: 100 ug/min via INTRAVENOUS

## 2016-03-25 MED ORDER — ALBUMIN HUMAN 5 % IV SOLN
12.5000 g | Freq: Once | INTRAVENOUS | Status: AC
  Administered 2016-03-25: 12.5 g via INTRAVENOUS
  Filled 2016-03-25: qty 250

## 2016-03-25 MED ORDER — FENTANYL CITRATE (PF) 100 MCG/2ML IJ SOLN
INTRAMUSCULAR | Status: AC
  Filled 2016-03-25: qty 4

## 2016-03-25 MED ORDER — 0.9 % SODIUM CHLORIDE (POUR BTL) OPTIME
TOPICAL | Status: DC | PRN
  Administered 2016-03-25: 1000 mL

## 2016-03-25 MED ORDER — POTASSIUM CHLORIDE 10 MEQ/100ML IV SOLN
10.0000 meq | INTRAVENOUS | Status: AC
  Administered 2016-03-25 (×6): 10 meq via INTRAVENOUS
  Filled 2016-03-25 (×3): qty 100

## 2016-03-25 MED ORDER — SUCCINYLCHOLINE CHLORIDE 200 MG/10ML IV SOSY
PREFILLED_SYRINGE | INTRAVENOUS | Status: AC
  Filled 2016-03-25: qty 10

## 2016-03-25 MED ORDER — SODIUM CHLORIDE 0.9 % IV SOLN
INTRAVENOUS | Status: DC | PRN
  Administered 2016-03-25: 02:00:00 via INTRAVENOUS

## 2016-03-25 MED ORDER — ROCURONIUM BROMIDE 10 MG/ML (PF) SYRINGE
PREFILLED_SYRINGE | INTRAVENOUS | Status: AC
  Filled 2016-03-25: qty 10

## 2016-03-25 MED ORDER — MIDAZOLAM HCL 2 MG/2ML IJ SOLN
INTRAMUSCULAR | Status: AC
  Filled 2016-03-25: qty 2

## 2016-03-25 MED ORDER — POTASSIUM PHOSPHATES 15 MMOLE/5ML IV SOLN
30.0000 mmol | Freq: Once | INTRAVENOUS | Status: AC
  Administered 2016-03-25: 30 mmol via INTRAVENOUS
  Filled 2016-03-25: qty 10

## 2016-03-25 MED ORDER — CHLORHEXIDINE GLUCONATE 0.12% ORAL RINSE (MEDLINE KIT)
15.0000 mL | Freq: Two times a day (BID) | OROMUCOSAL | Status: DC
  Administered 2016-03-25 – 2016-03-26 (×3): 15 mL via OROMUCOSAL

## 2016-03-25 MED ORDER — ALBUMIN HUMAN 5 % IV SOLN
INTRAVENOUS | Status: DC | PRN
  Administered 2016-03-25: 03:00:00 via INTRAVENOUS

## 2016-03-25 MED ORDER — FENTANYL CITRATE (PF) 100 MCG/2ML IJ SOLN
INTRAMUSCULAR | Status: DC | PRN
  Administered 2016-03-25: 100 ug via INTRAVENOUS
  Administered 2016-03-25 (×3): 50 ug via INTRAVENOUS

## 2016-03-25 MED ORDER — PROPOFOL 10 MG/ML IV BOLUS
INTRAVENOUS | Status: AC
  Filled 2016-03-25: qty 20

## 2016-03-25 MED ORDER — ONDANSETRON HCL 4 MG/2ML IJ SOLN
INTRAMUSCULAR | Status: AC
  Filled 2016-03-25: qty 2

## 2016-03-25 MED ORDER — ROCURONIUM BROMIDE 100 MG/10ML IV SOLN
INTRAVENOUS | Status: DC | PRN
  Administered 2016-03-25: 50 mg via INTRAVENOUS
  Administered 2016-03-25: 20 mg via INTRAVENOUS

## 2016-03-25 MED ORDER — LORAZEPAM 2 MG/ML IJ SOLN
1.0000 mg | Freq: Three times a day (TID) | INTRAMUSCULAR | Status: DC
  Administered 2016-03-25 – 2016-03-27 (×7): 1 mg via INTRAVENOUS
  Filled 2016-03-25 (×8): qty 1

## 2016-03-25 MED ORDER — MIDAZOLAM HCL 5 MG/5ML IJ SOLN
INTRAMUSCULAR | Status: DC | PRN
  Administered 2016-03-25: 2 mg via INTRAVENOUS

## 2016-03-25 MED ORDER — HYDROCORTISONE NA SUCCINATE PF 100 MG IJ SOLR
50.0000 mg | Freq: Four times a day (QID) | INTRAMUSCULAR | Status: DC
  Administered 2016-03-25 – 2016-03-27 (×9): 50 mg via INTRAVENOUS
  Filled 2016-03-25 (×8): qty 2

## 2016-03-25 MED ORDER — ROCURONIUM BROMIDE 10 MG/ML (PF) SYRINGE
PREFILLED_SYRINGE | INTRAVENOUS | Status: AC
  Filled 2016-03-25: qty 20

## 2016-03-25 SURGICAL SUPPLY — 40 items
CANISTER SUCTION 2500CC (MISCELLANEOUS) IMPLANT
CHLORAPREP W/TINT 26ML (MISCELLANEOUS) ×3 IMPLANT
COVER SURGICAL LIGHT HANDLE (MISCELLANEOUS) ×3 IMPLANT
DERMABOND ADVANCED (GAUZE/BANDAGES/DRESSINGS) ×2
DERMABOND ADVANCED .7 DNX12 (GAUZE/BANDAGES/DRESSINGS) ×1 IMPLANT
DRAPE LAPAROTOMY 100X72 PEDS (DRAPES) ×3 IMPLANT
DRAPE UTILITY XL STRL (DRAPES) ×3 IMPLANT
ELECT CAUTERY BLADE 6.4 (BLADE) ×3 IMPLANT
ELECT REM PT RETURN 9FT ADLT (ELECTROSURGICAL) ×3
ELECTRODE REM PT RTRN 9FT ADLT (ELECTROSURGICAL) ×1 IMPLANT
GAUZE SPONGE 4X4 12PLY STRL (GAUZE/BANDAGES/DRESSINGS) ×3 IMPLANT
GLOVE BIO SURGEON STRL SZ8 (GLOVE) ×3 IMPLANT
GLOVE BIOGEL PI IND STRL 7.0 (GLOVE) ×1 IMPLANT
GLOVE BIOGEL PI IND STRL 8 (GLOVE) ×1 IMPLANT
GLOVE BIOGEL PI INDICATOR 7.0 (GLOVE) ×2
GLOVE BIOGEL PI INDICATOR 8 (GLOVE) ×2
GLOVE SURG SS PI 7.0 STRL IVOR (GLOVE) ×3 IMPLANT
GOWN STRL REUS W/ TWL LRG LVL3 (GOWN DISPOSABLE) ×1 IMPLANT
GOWN STRL REUS W/ TWL XL LVL3 (GOWN DISPOSABLE) ×1 IMPLANT
GOWN STRL REUS W/TWL LRG LVL3 (GOWN DISPOSABLE) ×2
GOWN STRL REUS W/TWL XL LVL3 (GOWN DISPOSABLE) ×2
KIT BASIN OR (CUSTOM PROCEDURE TRAY) ×3 IMPLANT
KIT ROOM TURNOVER OR (KITS) ×3 IMPLANT
NEEDLE HYPO 25GX1X1/2 BEV (NEEDLE) ×3 IMPLANT
NS IRRIG 1000ML POUR BTL (IV SOLUTION) ×3 IMPLANT
PACK SURGICAL SETUP 50X90 (CUSTOM PROCEDURE TRAY) ×3 IMPLANT
PAD ARMBOARD 7.5X6 YLW CONV (MISCELLANEOUS) ×3 IMPLANT
PENCIL BUTTON HOLSTER BLD 10FT (ELECTRODE) ×3 IMPLANT
SPONGE LAP 18X18 X RAY DECT (DISPOSABLE) ×3 IMPLANT
SUT MON AB 4-0 PC3 18 (SUTURE) ×3 IMPLANT
SUT NOVA NAB DX-16 0-1 5-0 T12 (SUTURE) ×6 IMPLANT
SUT VIC AB 3-0 SH 27 (SUTURE) ×4
SUT VIC AB 3-0 SH 27X BRD (SUTURE) ×2 IMPLANT
SYR CONTROL 10ML LL (SYRINGE) ×3 IMPLANT
TAPE CLOTH SURG 6X10 WHT LF (GAUZE/BANDAGES/DRESSINGS) ×3 IMPLANT
TOWEL OR 17X24 6PK STRL BLUE (TOWEL DISPOSABLE) ×3 IMPLANT
TOWEL OR 17X26 10 PK STRL BLUE (TOWEL DISPOSABLE) ×3 IMPLANT
TUBE CONNECTING 12'X1/4 (SUCTIONS) ×1
TUBE CONNECTING 12X1/4 (SUCTIONS) ×2 IMPLANT
YANKAUER SUCT BULB TIP NO VENT (SUCTIONS) ×3 IMPLANT

## 2016-03-25 NOTE — Progress Notes (Signed)
Came to do echo but the patient is getting his tube removed

## 2016-03-25 NOTE — Interval H&P Note (Signed)
History and Physical Interval Note:  03/25/2016 1:59 AM  [name]  has presented today for surgery, with the diagnosis of hernia repair  The various methods of treatment have been discussed with the patient and family. After consideration of risks, benefits and other options for treatment, the patient has consented to  Procedure(s): HERNIA REPAIR UMBILICAL INCARCERATED (N/A) as a surgical intervention .  The patient's history has been reviewed, patient examined, no change in status, stable for surgery.  I have reviewed the patient's chart and labs.  Questions were answered to the patient's satisfaction.     Iven Earnhart A.

## 2016-03-25 NOTE — Op Note (Signed)
Preoperative diagnosis: Incarcerated umbilical hernia  Postoperative diagnosis: Same  Procedure: primary repair of umbilical hernia  Surgeon: Harriette Bouillonhomas Laya Letendre M.D.  Anesthesia: Gen.  EBL: 50 mL  Specimen: None  Indications for procedure: Patient presents emergently for incarcerated umbilical hernia. He has end-stage liver disease and is completed a TIPS procedure today. He was noted in the recovery room to have an incarcerated umbilical hernia. This was nonreducible. Repair was discussed with the family emergently since there is small bowel caught up in this by CT scanning. He has a high operative risk I gave him a 50-50 chance of surviving this. Without surgery he would more likely not survive. They opted for repair since he was intubated understanding the high operative risk in this circumstance secondary to his end-stage liver disease.The risk of hernia repair include bleeding,  Infection,   Recurrence of the hernia,  Mesh use, chronic pain,  Organ injury,  Bowel injury,  Bladder injury,   nerve injury with numbness around the incision,  Death,  and worsening of preexisting  medical problems.  The alternatives to surgery have been discussed as well..  Long term expectations of both operative and non operative treatments have been discussed.   The patient agrees to proceed.  Description of procedure: The patient was brought to the ICU to the operating room. An appropriate level anesthesia was initiated. He was already intubated. His abdomen was prepped and draped in a sterile fashion. Timeout was done. Received antibiotics. Curvilinear incision was made along the inferior base the umbilicus. There is a large nonreducible hernia noted. There was skin breakdown. Upon elevating the umbilical skin, the hernia sac was identified and opened. There is a knuckle of small bowel incarcerated within a small hernia defect. I widened the defect so I could pull out the small intestine. It was incarcerated but not  granulated. It pinked up and was reduced back abdominal cavity. He had significant ascites. Fascial defect closed primarily with #1 Novafil. Umbilical skin amputated closed with Vicryl and staples. Dry dressing applied. All final counts were found to be correct. The patient was returned to the ICU in critical but stable condition.

## 2016-03-25 NOTE — H&P (View-Only) (Signed)
Family desires repair of incarcerated umbilical hernia with SB and omentum High risk of dying from surgical intervention but unable to reduce and skin break down noted   The risk of hernia repair include bleeding,  Infection,   Recurrence of the hernia,  Mesh use, chronic pain,  Organ injury,  Bowel injury,  Bladder injury,   nerve injury with numbness around the incision,  Death,  and worsening of preexisting  medical problems.  The alternatives to surgery have been discussed as well..  Long term expectations of both operative and non operative treatments have been discussed.   The patient agrees to proceed.

## 2016-03-25 NOTE — Progress Notes (Signed)
General Surgery [facility]  03/25/2016  Assessment & Plan: POD#1 Status post primary repair of incarcerated umbilical hernia  Monitor wound  Advance diet as tolerated  Per medical team  Will follow wound with you        Velora Hecklerodd M. Shandell Jallow, MD, [facility]       [facility]       Office: [phone]    Subjective: Patient seated in bed, responds to questions.  No complaints  Objective: Vital signs in last 24 hours: Temp:  [97.1 F (36.2 C)-98.5 F (36.9 C)] 97.1 F (36.2 C) (10/28 0832) Pulse Rate:  [56-92] 80 (10/28 1000) Resp:  [15-20] 16 (10/28 1000) BP: (81-146)/(54-103) 90/66 (10/28 1000) SpO2:  [95 %-100 %] 98 % (10/28 1000) Arterial Line BP: (51-134)/(36-79) 51/45 (10/28 1000) FiO2 (%):  [40 %-100 %] 40 % (10/28 0741) Weight:  [64.3 kg (141 lb 12.1 oz)] 64.3 kg (141 lb 12.1 oz) (10/28 0500) Last BM Date:  (PTA)  Intake/Output from previous day: 10/27 0701 - 10/28 0700 In: 3989.2 [I.V.:3189.2; IV Piggyback:800] Out: 2360 [Urine:1375; Emesis/NG output:950; Blood:35] Intake/Output this shift: Total I/O In: 225 [I.V.:225] Out: -   Physical Exam: Abdomen - dressing dry and intact at umbilicus; no leakage of ascites  Lab Results:   Recent Labs  03/24/16 0816 03/25/16 0440  WBC 11.5* 9.7  HGB 17.6* 13.2  HCT 47.8 38.7*  PLT 143* 67*   BMET  Recent Labs  03/24/16 0816 03/25/16 0440  NA 125* 129*  K 3.3* 2.9*  CL 83* 94*  CO2 26 28  GLUCOSE 152* 132*  BUN 9 6  CREATININE 1.34* 0.60*  CALCIUM 8.9 7.4*   PT/INR  Recent Labs  03/24/16 0816 03/25/16 0440  LABPROT 17.9* 21.8*  INR 1.47 1.88   Comprehensive Metabolic Panel:    Component Value Date/Time   NA 129 (L) 03/25/2016 0440   NA 125 (L) 03/24/2016 0816   K 2.9 (L) 03/25/2016 0440   K 3.3 (L) 03/24/2016 0816   CL 94 (L) 03/25/2016 0440   CL 83 (L) 03/24/2016 0816   CO2 28 03/25/2016 0440   CO2 26 03/24/2016 0816   BUN 6 03/25/2016 0440   BUN 9  03/24/2016 0816   CREATININE 0.60 (L) 03/25/2016 0440   CREATININE 1.34 (H) 03/24/2016 0816   CREATININE 0.90 10/08/2015 1306   CREATININE 0.49 (L) 07/22/2015 1004   GLUCOSE 132 (H) 03/25/2016 0440   GLUCOSE 152 (H) 03/24/2016 0816   CALCIUM 7.4 (L) 03/25/2016 0440   CALCIUM 8.9 03/24/2016 0816   AST 114 (H) 03/25/2016 0440   AST 107 (H) 03/24/2016 0816   ALT 33 03/25/2016 0440   ALT 47 03/24/2016 0816   ALKPHOS 114 03/25/2016 0440   ALKPHOS 200 (H) 03/24/2016 0816   BILITOT 2.8 (H) 03/25/2016 0440   BILITOT 2.0 (H) 03/24/2016 0816   PROT 5.4 (L) 03/25/2016 0440   PROT 7.8 03/24/2016 0816   ALBUMIN 2.7 (L) 03/25/2016 0440   ALBUMIN 3.0 (L) 03/24/2016 0816    Studies/Results: Ct Abdomen Pelvis Wo Contrast  Addendum Date: 03/24/2016   ADDENDUM REPORT: 03/24/2016 16:37 ADDENDUM: These results were called by telephone at the time of interpretation on 03/24/2016 at 4:20 pm to Dr. Claud KelpHaywood Ingram, who verbally acknowledged these results. Electronically Signed   By: Carey BullocksWilliam  Veazey M.D.   On: 03/24/2016 16:37   Result Date: 03/24/2016 CLINICAL DATA:  Incarcerated umbilical hernia. Left lung nodule. Post TIPS procedure today. EXAM: CT  CHEST, ABDOMEN AND PELVIS WITHOUT CONTRAST TECHNIQUE: Multidetector CT imaging of the chest, abdomen and pelvis was performed following the standard protocol without IV contrast. COMPARISON:  Chest radiographs 03/24/2016. FINDINGS: CT CHEST FINDINGS Cardiovascular: Atherosclerosis of the aorta, great vessels and coronary arteries. Right IJ central venous catheter extends into the superior aspect of the right atrium. The heart size is normal. There is no pericardial effusion. Mediastinum/Nodes: There are no enlarged mediastinal, hilar or axillary lymph nodes. Endotracheal and nasogastric tubes are in place. There is possible wall thickening of the distal esophagus. The thyroid gland and trachea demonstrate no significant findings. Lungs/Pleura: There is no pleural  effusion. Mild emphysema. There is a small calcified granuloma in the lingula on image 70 which may account for the radiographic finding. No suspicious pulmonary nodules. There is no confluent airspace opacity or pneumothorax. Musculoskeletal/Chest wall: Bilateral gynecomastia noted. Old rib fractures are present bilaterally. Fractures of the left eighth and ninth ribs may be incompletely healed. There is a fracture of the T12 vertebral body with associated mild osseous retropulsion. This is not clearly seen on chest radiographs from April and could be subacute in age. CT ABDOMEN AND PELVIS FINDINGS Hepatobiliary: Diffuse hepatic steatosis. No focal hepatic abnormalities are seen on noncontrast imaging. There is high-density material layering within the gallbladder lumen. This is likely due to a combination of gallstones and vicarious excretion of contrast. No gallbladder wall thickening or significant biliary dilatation seen. Pancreas: Unremarkable. No pancreatic ductal dilatation or surrounding inflammatory changes. Spleen: Normal in size without focal abnormality. Adrenals/Urinary Tract: Both adrenal glands appear normal. There is contrast material within the renal collecting systems bilaterally, attributed to previous interventional procedure. Low-density renal lesions are present bilaterally, likely cysts. One in the lower pole of the left kidney measures 3.1 cm and 21 HU. No evidence of hydronephrosis. Bladder is partially decompressed by a Foley catheter. There is bladder wall thickening. Stomach/Bowel: The stomach is decompressed by a nasogastric tube. The proximal small bowel is fluid filled with scattered air-fluid levels and mild dilatation. There is an umbilical hernia containing small bowel, measuring up to 7.0 x 8.9 cm transverse. The distal small bowel is decompressed. No colonic abnormalities are seen. There is a small periampullary duodenal diverticulum. Vascular/Lymphatic: There are no enlarged  abdominal or pelvic lymph nodes. Diffuse atherosclerosis of the aorta and its branches. TIPS extends from the portal vein to the junction of the IVC with the right hepatic vein. Patency not addressed by this examination. No evidence of surrounding hematoma. Probable upper abdominal collateral vessels/varices. Reproductive: Mild enlargement of the prostate gland which demonstrates dystrophic calcifications. The seminal vesicles appear unremarkable. Other: As above, umbilical hernia containing small bowel. There is a moderate amount of ascites, with most of the fluid in the pelvis. Musculoskeletal: No acute or significant osseous findings. IMPRESSION: 1. No suspicious pulmonary nodule. There is a small calcified granuloma in the lingula. 2. Umbilical hernia containing fluid-filled small bowel. There is proximal small bowel dilatation with air-fluid levels, supporting the presence of an incarcerated hernia. 3. Moderate ascites, attributed to underlying liver disease. 4. T12 superior endplate compression fracture with osseous retropulsion, potentially subacute. 5. Indeterminate 3.1 cm left renal lesion, measuring higher than water density. Statistically, this is a complex cyst. 6. Additional incidental findings including emphysema, bilateral rib fractures, hepatic steatosis, diffuse atherosclerosis and bladder wall thickening. Electronically Signed: By: Carey BullocksWilliam  Veazey M.D. On: 03/24/2016 16:07   Dg Chest 2 View  Result Date: 03/24/2016 CLINICAL DATA:  Pre op today for  TIPS,copd,liver dz,htn,,weakness today EXAM: CHEST  2 VIEW COMPARISON:  09/05/2015 FINDINGS: Lungs are hyperinflated. Heart size is normal. No focal consolidations or pleural effusions are identified. Overlying the left mid lung zone there is a 9 mm nodule. Remote rib fractures. IMPRESSION: 1. 9 mm nodule overlying the left mid lung zone. As needed, further characterization can be performed with CT of the chest. Intravenous contrast is recommended  unless it is contraindicated. 2. No acute consolidation. These results will be called to the ordering clinician or representative by the Radiologist Assistant, and communication documented in the PACS or zVision Dashboard. Electronically Signed   By: Norva Pavlov M.D.   On: 03/24/2016 08:51   Ct Chest Wo Contrast  Addendum Date: 03/24/2016   ADDENDUM REPORT: 03/24/2016 16:37 ADDENDUM: These results were called by telephone at the time of interpretation on 03/24/2016 at 4:20 pm to Dr. Claud Kelp, who verbally acknowledged these results. Electronically Signed   By: Carey Bullocks M.D.   On: 03/24/2016 16:37   Result Date: 03/24/2016 CLINICAL DATA:  Incarcerated umbilical hernia. Left lung nodule. Post TIPS procedure today. EXAM: CT CHEST, ABDOMEN AND PELVIS WITHOUT CONTRAST TECHNIQUE: Multidetector CT imaging of the chest, abdomen and pelvis was performed following the standard protocol without IV contrast. COMPARISON:  Chest radiographs 03/24/2016. FINDINGS: CT CHEST FINDINGS Cardiovascular: Atherosclerosis of the aorta, great vessels and coronary arteries. Right IJ central venous catheter extends into the superior aspect of the right atrium. The heart size is normal. There is no pericardial effusion. Mediastinum/Nodes: There are no enlarged mediastinal, hilar or axillary lymph nodes. Endotracheal and nasogastric tubes are in place. There is possible wall thickening of the distal esophagus. The thyroid gland and trachea demonstrate no significant findings. Lungs/Pleura: There is no pleural effusion. Mild emphysema. There is a small calcified granuloma in the lingula on image 70 which may account for the radiographic finding. No suspicious pulmonary nodules. There is no confluent airspace opacity or pneumothorax. Musculoskeletal/Chest wall: Bilateral gynecomastia noted. Old rib fractures are present bilaterally. Fractures of the left eighth and ninth ribs may be incompletely healed. There is a  fracture of the T12 vertebral body with associated mild osseous retropulsion. This is not clearly seen on chest radiographs from April and could be subacute in age. CT ABDOMEN AND PELVIS FINDINGS Hepatobiliary: Diffuse hepatic steatosis. No focal hepatic abnormalities are seen on noncontrast imaging. There is high-density material layering within the gallbladder lumen. This is likely due to a combination of gallstones and vicarious excretion of contrast. No gallbladder wall thickening or significant biliary dilatation seen. Pancreas: Unremarkable. No pancreatic ductal dilatation or surrounding inflammatory changes. Spleen: Normal in size without focal abnormality. Adrenals/Urinary Tract: Both adrenal glands appear normal. There is contrast material within the renal collecting systems bilaterally, attributed to previous interventional procedure. Low-density renal lesions are present bilaterally, likely cysts. One in the lower pole of the left kidney measures 3.1 cm and 21 HU. No evidence of hydronephrosis. Bladder is partially decompressed by a Foley catheter. There is bladder wall thickening. Stomach/Bowel: The stomach is decompressed by a nasogastric tube. The proximal small bowel is fluid filled with scattered air-fluid levels and mild dilatation. There is an umbilical hernia containing small bowel, measuring up to 7.0 x 8.9 cm transverse. The distal small bowel is decompressed. No colonic abnormalities are seen. There is a small periampullary duodenal diverticulum. Vascular/Lymphatic: There are no enlarged abdominal or pelvic lymph nodes. Diffuse atherosclerosis of the aorta and its branches. TIPS extends from the portal vein to  the junction of the IVC with the right hepatic vein. Patency not addressed by this examination. No evidence of surrounding hematoma. Probable upper abdominal collateral vessels/varices. Reproductive: Mild enlargement of the prostate gland which demonstrates dystrophic calcifications. The  seminal vesicles appear unremarkable. Other: As above, umbilical hernia containing small bowel. There is a moderate amount of ascites, with most of the fluid in the pelvis. Musculoskeletal: No acute or significant osseous findings. IMPRESSION: 1. No suspicious pulmonary nodule. There is a small calcified granuloma in the lingula. 2. Umbilical hernia containing fluid-filled small bowel. There is proximal small bowel dilatation with air-fluid levels, supporting the presence of an incarcerated hernia. 3. Moderate ascites, attributed to underlying liver disease. 4. T12 superior endplate compression fracture with osseous retropulsion, potentially subacute. 5. Indeterminate 3.1 cm left renal lesion, measuring higher than water density. Statistically, this is a complex cyst. 6. Additional incidental findings including emphysema, bilateral rib fractures, hepatic steatosis, diffuse atherosclerosis and bladder wall thickening. Electronically Signed: By: Carey Bullocks M.D. On: 03/24/2016 16:07   US Renal  Result Date: 03/25/2016 CLINICAL DATA:  Urinary retention.  Cirrhosis of liver. EXAM: RENAL / URINARY TRACT ULTRASOUND COMPLETE COMPARISON:  Ultrasound 03/22/2016. FINDINGS: Right Kidney: Length: 11.5 cm. Echogenicity within normal limits. No mass or hydronephrosis visualized. Left Kidney: Length: 13.0 cm. 3 cm left lower pole renal cyst. Echogenicity within normal limits. No mass or hydronephrosis visualized. Bladder: The urinary bladder empty.  Foley catheter in the bladder Mild ascites. IMPRESSION: No renal obstruction.  Urinary bladder empty with Foley catheter Mild ascites, significantly improved from ultrasound of 03/22/2016 Electronically Signed   By: Marlan Palau M.D.   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 Recurrent large volume ascites requiring nearly weekly paracentesis. See previous consultation. EXAM: TRANSJUGULAR INTRAHEPATIC  PORTOSYSTEMIC SHUNT MEDICATIONS: As antibiotic prophylaxis, cefazolin 2 g was ordered pre-procedure and administered intravenously within one hour of incision. ANESTHESIA/SEDATION: General - as administered by the Anesthesia department CONTRAST:  80 mL Isovue FLUOROSCOPY TIME:  7 minutes 18 seconds, 895 mGy COMPLICATIONS: None immediate. PROCEDURE: Informed written consent was obtained from the patient after a thorough discussion of the procedural risks, benefits and alternatives. All questions were addressed. Maximal Sterile Barrier Technique was utilized including caps, mask, sterile gowns, sterile gloves, sterile drape, hand hygiene and skin antiseptic. A timeout was performed prior to the initiation of the procedure. Under ultrasound guidance, a 21 gauge micropuncture needle was advanced percutaneously into a right portal vein branch. A 018 guidewire was advanced into the main portal vein. 3French micro dilator was used for contrast injection to confirm portal venous positioning. A 5 French micropuncture dilator was placed with Touhy attachment. Confirmatory portal venography performed. Under ultrasound guidance, the right IJ vein was localized. The vessel was accessed with a 21-gauge micropuncture needle, exchanged over a 018 guidewire for a transitional dilator, through which a 035 guidewire was advanced. After tract dilatation, a 10 French curved vascular sheath was placed, advanced into the IVC. Through this, a 5 French angled angiographic catheter was advanced and used to catheterize the right hepatic vein. Confirmatory right hepatic venography performed. Portal and hepatic venous pressure measurements were obtained, demonstrating mean portosystemic gradient. The 018 guidewire was positioned with its transition point at the confluence of anterior and posterior right portal vein branches as a target. The [place] needle and sheath were advanced through the primary sheath, and the needle advanced into  the right portal vein  branch under biplane fluoroscopy guidance. After confirmatory contrast injection, an angled stiff glidewire was advanced into the splenic vein. The needle was exchanged for the 5 French angiographic catheter, advanced into the splenic vein for confirmatory portal venography. This demonstrated multiple esophageal varices and slow antegrade flow through the portal venous system. Measuring pigtail catheter advanced for planning portal venogram. This was exchanged for a a 7 mm Mustang angioplasty catheter used to dilate the parenchymal tract. This allowed advancement of the 10 French sheath into the portal vein. The 10 mm x 8 cm ViaTorr stent was advanced and deployed from right portal vein to right hepatic vein. The stent was dilated with a 10 mm angioplasty balloon. Follow-up pigtail catheter portal venogram demonstrates brisk antegrade flow through the TIPS, much less filling of a variceal channels, and minimal antegrade flow into intrahepatic portal vein branches. There is a nonocclusive mural thrombus near the portosplenic confluence. Pressure measurements reveal a 6 mm Hg portosystemic gradient. The pigtail catheter was withdrawn. The sheath was exchanged for a 25 cm 10 French sheath left with the tip at the SVC/RA junction as venous access. The sheath was secured externally with 0 Prolene suture and placed to a saline flush. The patient tolerated the procedure well. He was transferred to CT for previously planned imaging. IMPRESSION: 1. 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 Outpatient follow-up with a TIPS ultrasound in 1 month. Electronically Signed   By: Corlis Leak M.D.   On: 03/24/2016 16:37   Dg Chest Port 1 View  Result Date: 03/24/2016 CLINICAL DATA:  Postop, line placement EXAM: PORTABLE CHEST 1  VIEW COMPARISON:  03/24/2016 FINDINGS: Endotracheal tube is 5 cm above the carina. NG tube enters the stomach. Right internal jugular vascular sheath in place with the tip in the upper right atrium. No pneumothorax. Heart is normal size. No confluent airspace opacities or effusions. No acute bony abnormality. Old left rib fractures again noted, unchanged. IMPRESSION: No active disease. Electronically Signed   By: Charlett Nose M.D.   On: 03/24/2016 16:48      Harumi Yamin M 03/25/2016  Patient ID: [name], male   DOB: 02/13/1961, 55 y.o.   MRN: [id number]

## 2016-03-25 NOTE — Progress Notes (Signed)
eLink Physician-Brief Progress Note Patient Name: Bryce MawRobert [name] DOB: 04-06-1961 MRN: [id number]   Date of Service  03/25/2016  HPI/Events of Note  Hypokalemia, hypomag, and moderately low phos  eICU Interventions  Potassium, Mag, and Phos replaced     Intervention Category Intermediate Interventions: Electrolyte abnormality - evaluation and management  DETERDING,ELIZABETH 03/25/2016, 6:15 AM

## 2016-03-25 NOTE — Progress Notes (Signed)
  Echocardiogram 2D Echocardiogram has been performed.  [name], [name] 03/25/2016, 9:38 AM

## 2016-03-25 NOTE — Consult Note (Signed)
PULMONARY / CRITICAL CARE MEDICINE   Name: [name] MRN: [id number] DOB: Feb 04, 1961    ADMISSION DATE:  03/24/2016 CONSULTATION DATE:  03/24/16  REFERRING MD:  Dyanne Carrel, NP-C / TRH   CHIEF COMPLAINT:  ETOH cirrhosis s/p TIPS, incarcerated umbilical hernia   HISTORY OF PRESENT ILLNESS:   55 y/o M, previously homeless (living with his brother currently), followed at [facility] with a PMH of depression, GERD, constipation, hypertension, COPD and ETOH cirrhosis with persistent ascites requiring weekly paracentesis who presented to [facility] on 10/27 for planned TIPS procedure.    On arrival for procedure, labs were assessed and showed acute kidney injury (sr cr up to 1.34 from 0.53), hyponatremia (125), hypokalemia (3.3), glucose 152, alk phos 200, albumin 3.0, AST 107 / ALT 47, WBC 11.5, Hgb 17.6, platelets 143 and INR 1.47.  CXR showed a 1m pulmonary nodule on the left, no acute consolidation. The patient had previously been followed by hospice but was taken off for TIPS procedure.  Per admit notes, he reported increased abdominal pain after his last paracentesis (10/27), difficulty initiating flow with urination / incomplete emptying and his umbilical hernia seemed "bigger" than usual.  [facility].  Plan of care was to proceed with TIPS procedure 10/27.  He was electively intubated per anesthesia for the procedure.  During the imaging assessment for TIPS, it was discovered that he had an incarcerated hernia.  CCS was consulted for evaluation of incarcerated hernia.  It was anticipated that he will return post procedure to ICU on mechanical ventilation, PCCM consulted.    SUBJECTIVE: remains on vent  VITAL SIGNS: BP (!) 82/62   Pulse (!) 59   Temp 98.5 F (36.9 C) (Oral)   Resp 16   Ht 6' 1"  (1.854 m)   Wt 64.3 kg (141 lb 12.1 oz)   SpO2 100%   BMI 18.70 kg/m   HEMODYNAMICS:    VENTILATOR SETTINGS: Vent Mode: PRVC FiO2  (%):  [40 %-100 %] 40 % Set Rate:  [16 bmp] 16 bmp Vt Set:  [560 mL] 560 mL PEEP:  [5 cmH20] 5 cmH20 Plateau Pressure:  [12 cmH20-21 cmH20] 21 cmH20  INTAKE / OUTPUT: I/O last 3 completed shifts: In: 3914.2 [I.V.:3114.2; IV PBTDHRCBUL:845]Out: 2360 [[XMIWO:0321 Emesis/NG output:950; Blood:35]  PHYSICAL EXAMINATION: General: Patient sedated rass -1 Neuro: FC well, nonfocal HEENT: jvd wnl PULM: cta anterior CV: s1 s2 RRR GI: sofr, wound clean, BS low, no r, mild distention Extremities:  No edmea   LABS:  BMET  Recent Labs Lab 03/24/16 0816 03/25/16 0440  NA 125* 129*  K 3.3* 2.9*  CL 83* 94*  CO2 26 28  BUN 9 6  CREATININE 1.34* 0.60*  GLUCOSE 152* 132*    Electrolytes  Recent Labs Lab 03/24/16 0816 03/25/16 0440  CALCIUM 8.9 7.4*  MG  --  1.5*  PHOS  --  2.3*    CBC  Recent Labs Lab 03/24/16 0816 03/25/16 0440  WBC 11.5* 9.7  HGB 17.6* 13.2  HCT 47.8 38.7*  PLT 143* 67*    Coag's  Recent Labs Lab 03/24/16 0816 03/25/16 0440  APTT 38* 49*  INR 1.47 1.88    Sepsis Markers  Recent Labs Lab 03/24/16 1700  PROCALCITON 0.27    ABG  Recent Labs Lab 03/24/16 1730  PHART 7.466*  PCO2ART 41.2  PO2ART 224*    Liver Enzymes  Recent Labs Lab 03/24/16 0816 03/25/16 0440  AST  107* 114*  ALT 47 33  ALKPHOS 200* 114  BILITOT 2.0* 2.8*  ALBUMIN 3.0* 2.7*    Cardiac Enzymes  Recent Labs Lab 03/24/16 1731 03/24/16 2350 03/25/16 0440  TROPONINI 0.04* 0.05* 0.03*    Glucose  Recent Labs Lab 03/24/16 2323  GLUCAP 88    Imaging Ct Abdomen Pelvis Wo Contrast  Addendum Date: 03/24/2016   ADDENDUM REPORT: 03/24/2016 16:37 ADDENDUM: These results were called by telephone at the time of interpretation on 03/24/2016 at 4:20 pm to Dr. Fanny Skates, who verbally acknowledged these results. Electronically Signed   By: Richardean Sale M.D.   On: 03/24/2016 16:37   Result Date: 03/24/2016 CLINICAL DATA:  Incarcerated  umbilical hernia. Left lung nodule. Post TIPS procedure today. EXAM: CT CHEST, ABDOMEN AND PELVIS WITHOUT CONTRAST TECHNIQUE: Multidetector CT imaging of the chest, abdomen and pelvis was performed following the standard protocol without IV contrast. COMPARISON:  Chest radiographs 03/24/2016. FINDINGS: CT CHEST FINDINGS Cardiovascular: Atherosclerosis of the aorta, great vessels and coronary arteries. Right IJ central venous catheter extends into the superior aspect of the right atrium. The heart size is normal. There is no pericardial effusion. Mediastinum/Nodes: There are no enlarged mediastinal, hilar or axillary lymph nodes. Endotracheal and nasogastric tubes are in place. There is possible wall thickening of the distal esophagus. The thyroid gland and trachea demonstrate no significant findings. Lungs/Pleura: There is no pleural effusion. Mild emphysema. There is a small calcified granuloma in the lingula on image 70 which may account for the radiographic finding. No suspicious pulmonary nodules. There is no confluent airspace opacity or pneumothorax. Musculoskeletal/Chest wall: Bilateral gynecomastia noted. Old rib fractures are present bilaterally. Fractures of the left eighth and ninth ribs may be incompletely healed. There is a fracture of the T12 vertebral body with associated mild osseous retropulsion. This is not clearly seen on chest radiographs from April and could be subacute in age. CT ABDOMEN AND PELVIS FINDINGS Hepatobiliary: Diffuse hepatic steatosis. No focal hepatic abnormalities are seen on noncontrast imaging. There is high-density material layering within the gallbladder lumen. This is likely due to a combination of gallstones and vicarious excretion of contrast. No gallbladder wall thickening or significant biliary dilatation seen. Pancreas: Unremarkable. No pancreatic ductal dilatation or surrounding inflammatory changes. Spleen: Normal in size without focal abnormality. Adrenals/Urinary  Tract: Both adrenal glands appear normal. There is contrast material within the renal collecting systems bilaterally, attributed to previous interventional procedure. Low-density renal lesions are present bilaterally, likely cysts. One in the lower pole of the left kidney measures 3.1 cm and 21 HU. No evidence of hydronephrosis. Bladder is partially decompressed by a Foley catheter. There is bladder wall thickening. Stomach/Bowel: The stomach is decompressed by a nasogastric tube. The proximal small bowel is fluid filled with scattered air-fluid levels and mild dilatation. There is an umbilical hernia containing small bowel, measuring up to 7.0 x 8.9 cm transverse. The distal small bowel is decompressed. No colonic abnormalities are seen. There is a small periampullary duodenal diverticulum. Vascular/Lymphatic: There are no enlarged abdominal or pelvic lymph nodes. Diffuse atherosclerosis of the aorta and its branches. TIPS extends from the portal vein to the junction of the IVC with the right hepatic vein. Patency not addressed by this examination. No evidence of surrounding hematoma. Probable upper abdominal collateral vessels/varices. Reproductive: Mild enlargement of the prostate gland which demonstrates dystrophic calcifications. The seminal vesicles appear unremarkable. Other: As above, umbilical hernia containing small bowel. There is a moderate amount of ascites, with most of the  fluid in the pelvis. Musculoskeletal: No acute or significant osseous findings. IMPRESSION: 1. No suspicious pulmonary nodule. There is a small calcified granuloma in the lingula. 2. Umbilical hernia containing fluid-filled small bowel. There is proximal small bowel dilatation with air-fluid levels, supporting the presence of an incarcerated hernia. 3. Moderate ascites, attributed to underlying liver disease. 4. T12 superior endplate compression fracture with osseous retropulsion, potentially subacute. 5. Indeterminate 3.1 cm left  renal lesion, measuring higher than water density. Statistically, this is a complex cyst. 6. Additional incidental findings including emphysema, bilateral rib fractures, hepatic steatosis, diffuse atherosclerosis and bladder wall thickening. Electronically Signed: By: Richardean Sale M.D. On: 03/24/2016 16:07   Dg Chest 2 View  Result Date: 03/24/2016 CLINICAL DATA:  Pre op today for TIPS,copd,liver dz,htn,,weakness today EXAM: CHEST  2 VIEW COMPARISON:  09/05/2015 FINDINGS: Lungs are hyperinflated. Heart size is normal. No focal consolidations or pleural effusions are identified. Overlying the left mid lung zone there is a 9 mm nodule. Remote rib fractures. IMPRESSION: 1. 9 mm nodule overlying the left mid lung zone. As needed, further characterization can be performed with CT of the chest. Intravenous contrast is recommended unless it is contraindicated. 2. No acute consolidation. These results will be called to the ordering clinician or representative by the Radiologist Assistant, and communication documented in the PACS or zVision Dashboard. Electronically Signed   By: Nolon Nations M.D.   On: 03/24/2016 08:51   Ct Chest Wo Contrast  Addendum Date: 03/24/2016   ADDENDUM REPORT: 03/24/2016 16:37 ADDENDUM: These results were called by telephone at the time of interpretation on 03/24/2016 at 4:20 pm to Dr. Fanny Skates, who verbally acknowledged these results. Electronically Signed   By: Richardean Sale M.D.   On: 03/24/2016 16:37   Result Date: 03/24/2016 CLINICAL DATA:  Incarcerated umbilical hernia. Left lung nodule. Post TIPS procedure today. EXAM: CT CHEST, ABDOMEN AND PELVIS WITHOUT CONTRAST TECHNIQUE: Multidetector CT imaging of the chest, abdomen and pelvis was performed following the standard protocol without IV contrast. COMPARISON:  Chest radiographs 03/24/2016. FINDINGS: CT CHEST FINDINGS Cardiovascular: Atherosclerosis of the aorta, great vessels and coronary arteries. Right IJ  central venous catheter extends into the superior aspect of the right atrium. The heart size is normal. There is no pericardial effusion. Mediastinum/Nodes: There are no enlarged mediastinal, hilar or axillary lymph nodes. Endotracheal and nasogastric tubes are in place. There is possible wall thickening of the distal esophagus. The thyroid gland and trachea demonstrate no significant findings. Lungs/Pleura: There is no pleural effusion. Mild emphysema. There is a small calcified granuloma in the lingula on image 70 which may account for the radiographic finding. No suspicious pulmonary nodules. There is no confluent airspace opacity or pneumothorax. Musculoskeletal/Chest wall: Bilateral gynecomastia noted. Old rib fractures are present bilaterally. Fractures of the left eighth and ninth ribs may be incompletely healed. There is a fracture of the T12 vertebral body with associated mild osseous retropulsion. This is not clearly seen on chest radiographs from April and could be subacute in age. CT ABDOMEN AND PELVIS FINDINGS Hepatobiliary: Diffuse hepatic steatosis. No focal hepatic abnormalities are seen on noncontrast imaging. There is high-density material layering within the gallbladder lumen. This is likely due to a combination of gallstones and vicarious excretion of contrast. No gallbladder wall thickening or significant biliary dilatation seen. Pancreas: Unremarkable. No pancreatic ductal dilatation or surrounding inflammatory changes. Spleen: Normal in size without focal abnormality. Adrenals/Urinary Tract: Both adrenal glands appear normal. There is contrast material within the renal  collecting systems bilaterally, attributed to previous interventional procedure. Low-density renal lesions are present bilaterally, likely cysts. One in the lower pole of the left kidney measures 3.1 cm and 21 HU. No evidence of hydronephrosis. Bladder is partially decompressed by a Foley catheter. There is bladder wall  thickening. Stomach/Bowel: The stomach is decompressed by a nasogastric tube. The proximal small bowel is fluid filled with scattered air-fluid levels and mild dilatation. There is an umbilical hernia containing small bowel, measuring up to 7.0 x 8.9 cm transverse. The distal small bowel is decompressed. No colonic abnormalities are seen. There is a small periampullary duodenal diverticulum. Vascular/Lymphatic: There are no enlarged abdominal or pelvic lymph nodes. Diffuse atherosclerosis of the aorta and its branches. TIPS extends from the portal vein to the junction of the IVC with the right hepatic vein. Patency not addressed by this examination. No evidence of surrounding hematoma. Probable upper abdominal collateral vessels/varices. Reproductive: Mild enlargement of the prostate gland which demonstrates dystrophic calcifications. The seminal vesicles appear unremarkable. Other: As above, umbilical hernia containing small bowel. There is a moderate amount of ascites, with most of the fluid in the pelvis. Musculoskeletal: No acute or significant osseous findings. IMPRESSION: 1. No suspicious pulmonary nodule. There is a small calcified granuloma in the lingula. 2. Umbilical hernia containing fluid-filled small bowel. There is proximal small bowel dilatation with air-fluid levels, supporting the presence of an incarcerated hernia. 3. Moderate ascites, attributed to underlying liver disease. 4. T12 superior endplate compression fracture with osseous retropulsion, potentially subacute. 5. Indeterminate 3.1 cm left renal lesion, measuring higher than water density. Statistically, this is a complex cyst. 6. Additional incidental findings including emphysema, bilateral rib fractures, hepatic steatosis, diffuse atherosclerosis and bladder wall thickening. Electronically Signed: By: Richardean Sale M.D. On: 03/24/2016 16:07   US Renal  Result Date: 03/25/2016 CLINICAL DATA:  Urinary retention.  Cirrhosis of liver.  EXAM: RENAL / URINARY TRACT ULTRASOUND COMPLETE COMPARISON:  Ultrasound 03/22/2016. FINDINGS: Right Kidney: Length: 11.5 cm. Echogenicity within normal limits. No mass or hydronephrosis visualized. Left Kidney: Length: 13.0 cm. 3 cm left lower pole renal cyst. Echogenicity within normal limits. No mass or hydronephrosis visualized. Bladder: The urinary bladder empty.  Foley catheter in the bladder Mild ascites. IMPRESSION: No renal obstruction.  Urinary bladder empty with Foley catheter Mild ascites, significantly improved from ultrasound of 03/22/2016 Electronically Signed   By: Franchot Gallo M.D.   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 Recurrent large volume ascites requiring nearly weekly paracentesis. See previous consultation. EXAM: TRANSJUGULAR INTRAHEPATIC PORTOSYSTEMIC SHUNT MEDICATIONS: As antibiotic prophylaxis, cefazolin 2 g was ordered pre-procedure and administered intravenously within one hour of incision. ANESTHESIA/SEDATION: General - as administered by the Anesthesia department CONTRAST:  80 mL Isovue FLUOROSCOPY TIME:  7 minutes 18 seconds, 629 mGy COMPLICATIONS: None immediate. PROCEDURE: Informed written consent was obtained from the patient after a thorough discussion of the procedural risks, benefits and alternatives. All questions were addressed. Maximal Sterile Barrier Technique was utilized including caps, mask, sterile gowns, sterile gloves, sterile drape, hand hygiene and skin antiseptic. A timeout was performed prior to the initiation of the procedure. Under ultrasound guidance, a 21 gauge micropuncture needle was advanced percutaneously into a right portal vein branch. A 018 guidewire was advanced into the main portal vein. 3French micro dilator was used for contrast injection to confirm portal venous positioning. A 5 French micropuncture dilator was placed with Touhy attachment. Confirmatory portal venography  performed. Under ultrasound guidance, the right IJ vein was localized. The vessel was accessed with a 21-gauge micropuncture needle, exchanged over a 018 guidewire for a transitional dilator, through which a 035 guidewire was advanced. After tract dilatation, a 10 French curved vascular sheath was placed, advanced into the IVC. Through this, a 5 French angled angiographic catheter was advanced and used to catheterize the right hepatic vein. Confirmatory right hepatic venography performed. Portal and hepatic venous pressure measurements were obtained, demonstrating 20mHg mean portosystemic gradient. The 018 guidewire was positioned with its transition point at the confluence of anterior and posterior right portal vein branches as a target. The [place] needle and sheath were advanced through the primary sheath, and the needle advanced into the right portal vein branch under biplane fluoroscopy guidance. After confirmatory contrast injection, an angled stiff glidewire was advanced into the splenic vein. The needle was exchanged for the 5 French angiographic catheter, advanced into the splenic vein for confirmatory portal venography. This demonstrated multiple esophageal varices and slow antegrade flow through the portal venous system. Measuring pigtail catheter advanced for planning portal venogram. This was exchanged for a a 7 mm Mustang angioplasty catheter used to dilate the parenchymal tract. This allowed advancement of the 10 French sheath into the portal vein. The 10 mm x 8 cm ViaTorr stent was advanced and deployed from right portal vein to right hepatic vein. The stent was dilated with a 10 mm angioplasty balloon. Follow-up pigtail catheter portal venogram demonstrates brisk antegrade flow through the TIPS, much less filling of a variceal channels, and minimal antegrade flow into intrahepatic portal vein branches. There is a nonocclusive mural thrombus near the portosplenic confluence. Pressure measurements  reveal a 6 mm Hg portosystemic gradient. The pigtail catheter was withdrawn. The sheath was exchanged for a 25 cm 10 French sheath left with the tip at the SVC/RA junction as venous access. The sheath was secured externally with 0 Prolene suture and placed to a saline flush. The patient tolerated the procedure well. He was transferred to CT for previously planned imaging. IMPRESSION: 1. 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 Outpatient follow-up with a TIPS ultrasound in 1 month. Electronically Signed   By: DLucrezia EuropeM.D.   On: 03/24/2016 16:37   Dg Chest Port 1 View  Result Date: 03/24/2016 CLINICAL DATA:  Postop, line placement EXAM: PORTABLE CHEST 1 VIEW COMPARISON:  03/24/2016 FINDINGS: Endotracheal tube is 5 cm above the carina. NG tube enters the stomach. Right internal jugular vascular sheath in place with the tip in the upper right atrium. No pneumothorax. Heart is normal size. No confluent airspace opacities or effusions. No acute bony abnormality. Old left rib fractures again noted, unchanged. IMPRESSION: No active disease. Electronically Signed   By: KRolm BaptiseM.D.   On: 03/24/2016 16:48     STUDIES:  CT CHEST/ABD/PELVIS W/O 10/27: IMPRESSION: 1. No suspicious pulmonary nodule. There is a small calcified granuloma in the lingula. 2. Umbilical hernia containing fluid-filled small bowel. There is proximal small bowel dilatation with air-fluid levels, supporting the presence of an incarcerated hernia. 3. Moderate ascites, attributed to underlying liver disease. 4. T12 superior endplate compression fracture with osseous retropulsion, potentially subacute. 5. Indeterminate 3.1 cm left renal lesion, measuring higher than water density. Statistically, this is a complex cyst. 6. Additional incidental findings  including emphysema, bilateral rib fractures, hepatic steatosis,  diffuse atherosclerosis and bladder wall thickening.  MICROBIOLOGY: MRSA PCR 10/27 >> Blood Ctx x2 10/27 >>  ANTIBIOTICS: Vancomycin 10/27 >> Zosyn 10/27 >>  SIGNIFICANT EVENTS: 10/03 - Paracentesis 4.1L yellow fluid 10/10 - Paracentesis w/ 4L amber fluid 10/16 - Paracentesis w/ 4L yellow fluid 10/25 - Paracentesis w/ 2.4L amber fluid 10/27 - Admit for TIPS, AKI, found to have incarcerated hernia / s/p TIPS, tops performed and hernia repair  LINES/TUBES: OETT 7.5 10/27 >>  R IJ CVL (single lumen post TIPS) 10/27 >> L Radial Art Line 10/27 >> OGT 10/27 >> Foley 10/27 >> PIV x2   DISCUSSION: 55 y/o M with PMH of ETOH cirrhosis, previously on hospice, presented 10/27 for planned TIPS.  Ultimately admitted for acute renal failure.  Found to have incarcerated hernia during imaging for TIPS.  CCS evaluated, pending decision regarding surgical intervention. Starting broad-spectrum antibiotics. Have attempted to contact family but unsuccessful.  ASSESSMENT / PLAN:  PULMONARY A: H/O COPD - Emphysema seen on CT scan today. Tobacco Abuse  Acute resp failure P:   ABG reviewed, if remains on vent reduce MV Wean aggressive cpap 5 ps 5, goal 30 min, asssess rsbi Even balance goals  CARDIOVASCULAR A:  Shock - Resolving. Likely due to pre-renal state H/O HTN Sirs response Has rel AI P:  Neo to MAP 50 with good MS No more trop Tele Albumin challenge Lactic repeat I do not think this is sepsis Dc lasix Add stress roids  RENAL A:   Acute Renal Failure - Possible Hepato-Renal Syndrome ( unlikely) Hypokalemia - Mild.  P:   k supp Pos balance Albumin Renal U/S neg Ensure urine na  GASTROINTESTINAL A:   ETOH Cirrhosis w/ Ascites - Requiring weekly paracentesis. Last paracentesis 10/25. Incarcerated Umbilical Hernia  Transaminitis H/O GERD  P:   NPO Pepcid IV q24hr   HEMATOLOGIC A:   Leukocytosis  - Likely early sepsis versus demargination. Thrombocytopenia - dilution, sirs, cirhosis  Coagulopathy - Likely some element of synthetic dysfunction w/ INR 1.47 & Albumin 2.1.  P:  Cbc in am  SCDs Heparin [place] q8hr - dc  INFECTIOUS A:   Possible Sepsis - From strangulated umbilical hernia. Possible SBP - Multiple paracenteses.   P:   Follow cultures Pct low ABX, re assess need for any abx in am   ENDOCRINE A:   Hyperglycemia - No h/o DM. Risk for Hypoglycemia Rel AI P:   Add stress roids ssi  NEUROLOGIC A:   Sedation on Ventilator ETOH Abuse H/O Depression  High risk enceph post tips P:   RASS goal: 0 to -1  Versed IV prn sedation Fentanyl IV prn pain/discomfort Thiamine & Folic Acid IV daily CIWA Protocol once extubated and OUT of icu, will have MD driven treatment while in icu  Ccm time 35 min   Lavon Paganini. Titus Mould, MD, [place] Pgr: [facility]

## 2016-03-25 NOTE — Procedures (Signed)
Extubation Procedure Note  Patient Details:   Name: [name] DOB: [birth year] MRN: [id number]   Airway Documentation:     Evaluation  O2 sats: stable throughout Complications: No apparent complications Patient did tolerate procedure well. Bilateral Breath Sounds: Clear   Yes   Pt extubated to 3L [place] per MD order. Pt stable throughout with no complications. Pt has a weak but productive cough and is able to speak clearly. RT will continue to monitor.   [name], [name] 03/25/2016, 8:56 AM

## 2016-03-25 NOTE — Progress Notes (Signed)
Per Dr. Darrick Pennaeterding, it is okay to use Right IJ Sheath as a central line. Will continue to monitor.

## 2016-03-25 NOTE — Progress Notes (Signed)
Referring Physician(s): [name],E  Supervising Physician: Jolaine Click  Patient Status:  [facility] [place] - In-pt  Chief Complaint:  Cirrhosis, refractory ascites, incarcerated umbilical hernia  Subjective: Pt extubated earlier today and also s/p repair of incarcerated umbilical hernia at 0330; s/p TIPS 10/27; a little groggy still but arousable; answers questions ok; family in room; denies abd pain,N/V, resp difficulties or bleeding   Allergies: Review of patient's allergies indicates no known allergies.  Medications: Prior to Admission medications   Medication Sig Start Date End Date Taking? Authorizing Provider  albuterol (PROVENTIL HFA;VENTOLIN HFA) 108 (90 Base) MCG/ACT inhaler Inhale 2 puffs into the lungs every 6 (six) hours as needed for wheezing or shortness of breath. 12/02/15  Yes [name], MD  citalopram (CELEXA) 20 MG tablet Take 1 tablet (20 mg total) by mouth daily. Patient taking differently: Take 20 mg by mouth every evening.  03/10/16  Yes [name], MD  diclofenac sodium (VOLTAREN) 1 % GEL Apply 4 g topically 4 (four) times daily. Patient taking differently: Apply 4 g topically 4 (four) times daily as needed (for pain).  10/21/15  Yes [name], MD  furosemide (LASIX) 20 MG tablet TAKE 1 TABLET BY MOUTH DAILY. Patient taking differently: TAKE 1 TABLET BY MOUTH DAILY IN THE AFTERNOON 03/02/16  Yes [name], MD  lactulose (CHRONULAC) 10 GM/15ML solution Take 15 mLs (10 g total) by mouth 2 (two) times daily. 07/22/15  Yes [name], MD  potassium chloride SA (K-DUR,KLOR-CON) 20 MEQ tablet Take 1 tablet (20 mEq total) by mouth daily. Patient taking differently: Take 20 mEq by mouth every evening.  03/10/16  Yes [name], MD  ranitidine (ZANTAC) 150 MG tablet Take 1 tablet (150 mg total) by mouth 2 (two) times daily. 11/22/15  Yes [name], MD  spironolactone (ALDACTONE) 50 MG tablet Take 1 tablet (50 mg total) by mouth daily. 03/10/16  Yes [name], MD      Vital Signs: BP 90/66   Pulse 80   Temp 97.1 F (36.2 C) (Axillary)   Resp 16   Ht 6\' 1"  (1.854 m)   Wt 141 lb 12.1 oz (64.3 kg)   SpO2 98%   BMI 18.70 kg/m   Physical Exam sl drowsy but arousable; chest- CTA bilat ant; heart- RRR; abd- soft, mild dist; umbilical wound clean with intact staples;NT; LE- no sig edema; rt IJ cath intact, site ok  Imaging: Ct Abdomen Pelvis Wo Contrast  Addendum Date: 03/24/2016   ADDENDUM REPORT: 03/24/2016 16:37 ADDENDUM: These results were called by telephone at the time of interpretation on 03/24/2016 at 4:20 pm to Dr. Claud Kelp, who verbally acknowledged these results. Electronically Signed   By: Carey Bullocks M.D.   On: 03/24/2016 16:37   Result Date: 03/24/2016 CLINICAL DATA:  Incarcerated umbilical hernia. Left lung nodule. Post TIPS procedure today. EXAM: CT CHEST, ABDOMEN AND PELVIS WITHOUT CONTRAST TECHNIQUE: Multidetector CT imaging of the chest, abdomen and pelvis was performed following the standard protocol without IV contrast. COMPARISON:  Chest radiographs 03/24/2016. FINDINGS: CT CHEST FINDINGS Cardiovascular: Atherosclerosis of the aorta, great vessels and coronary arteries. Right IJ central venous catheter extends into the superior aspect of the right atrium. The heart size is normal. There is no pericardial effusion. Mediastinum/Nodes: There are no enlarged mediastinal, hilar or axillary lymph nodes. Endotracheal and nasogastric tubes are in place. There is possible wall thickening of the distal esophagus. The thyroid gland and trachea demonstrate no significant findings. Lungs/Pleura: There is no pleural effusion.  Mild emphysema. There is a small calcified granuloma in the lingula on image 70 which may account for the radiographic finding. No suspicious pulmonary nodules. There is no confluent airspace opacity or pneumothorax. Musculoskeletal/Chest wall: Bilateral gynecomastia noted. Old rib fractures are present bilaterally.  Fractures of the left eighth and ninth ribs may be incompletely healed. There is a fracture of the T12 vertebral body with associated mild osseous retropulsion. This is not clearly seen on chest radiographs from April and could be subacute in age. CT ABDOMEN AND PELVIS FINDINGS Hepatobiliary: Diffuse hepatic steatosis. No focal hepatic abnormalities are seen on noncontrast imaging. There is high-density material layering within the gallbladder lumen. This is likely due to a combination of gallstones and vicarious excretion of contrast. No gallbladder wall thickening or significant biliary dilatation seen. Pancreas: Unremarkable. No pancreatic ductal dilatation or surrounding inflammatory changes. Spleen: Normal in size without focal abnormality. Adrenals/Urinary Tract: Both adrenal glands appear normal. There is contrast material within the renal collecting systems bilaterally, attributed to previous interventional procedure. Low-density renal lesions are present bilaterally, likely cysts. One in the lower pole of the left kidney measures 3.1 cm and 21 HU. No evidence of hydronephrosis. Bladder is partially decompressed by a Foley catheter. There is bladder wall thickening. Stomach/Bowel: The stomach is decompressed by a nasogastric tube. The proximal small bowel is fluid filled with scattered air-fluid levels and mild dilatation. There is an umbilical hernia containing small bowel, measuring up to 7.0 x 8.9 cm transverse. The distal small bowel is decompressed. No colonic abnormalities are seen. There is a small periampullary duodenal diverticulum. Vascular/Lymphatic: There are no enlarged abdominal or pelvic lymph nodes. Diffuse atherosclerosis of the aorta and its branches. TIPS extends from the portal vein to the junction of the IVC with the right hepatic vein. Patency not addressed by this examination. No evidence of surrounding hematoma. Probable upper abdominal collateral vessels/varices. Reproductive: Mild  enlargement of the prostate gland which demonstrates dystrophic calcifications. The seminal vesicles appear unremarkable. Other: As above, umbilical hernia containing small bowel. There is a moderate amount of ascites, with most of the fluid in the pelvis. Musculoskeletal: No acute or significant osseous findings. IMPRESSION: 1. No suspicious pulmonary nodule. There is a small calcified granuloma in the lingula. 2. Umbilical hernia containing fluid-filled small bowel. There is proximal small bowel dilatation with air-fluid levels, supporting the presence of an incarcerated hernia. 3. Moderate ascites, attributed to underlying liver disease. 4. T12 superior endplate compression fracture with osseous retropulsion, potentially subacute. 5. Indeterminate 3.1 cm left renal lesion, measuring higher than water density. Statistically, this is a complex cyst. 6. Additional incidental findings including emphysema, bilateral rib fractures, hepatic steatosis, diffuse atherosclerosis and bladder wall thickening. Electronically Signed: By: Carey Bullocks M.D. On: 03/24/2016 16:07   Dg Chest 2 View  Result Date: 03/24/2016 CLINICAL DATA:  Pre op today for TIPS,copd,liver dz,htn,,weakness today EXAM: CHEST  2 VIEW COMPARISON:  09/05/2015 FINDINGS: Lungs are hyperinflated. Heart size is normal. No focal consolidations or pleural effusions are identified. Overlying the left mid lung zone there is a 9 mm nodule. Remote rib fractures. IMPRESSION: 1. 9 mm nodule overlying the left mid lung zone. As needed, further characterization can be performed with CT of the chest. Intravenous contrast is recommended unless it is contraindicated. 2. No acute consolidation. These results will be called to the ordering clinician or representative by the Radiologist Assistant, and communication documented in the PACS or zVision Dashboard. Electronically Signed   By: Norva Pavlov M.D.  On: 03/24/2016 08:51   Ct Chest Wo Contrast  Addendum  Date: 03/24/2016   ADDENDUM REPORT: 03/24/2016 16:37 ADDENDUM: These results were called by telephone at the time of interpretation on 03/24/2016 at 4:20 pm to Dr. Claud Kelp, who verbally acknowledged these results. Electronically Signed   By: Carey Bullocks M.D.   On: 03/24/2016 16:37   Result Date: 03/24/2016 CLINICAL DATA:  Incarcerated umbilical hernia. Left lung nodule. Post TIPS procedure today. EXAM: CT CHEST, ABDOMEN AND PELVIS WITHOUT CONTRAST TECHNIQUE: Multidetector CT imaging of the chest, abdomen and pelvis was performed following the standard protocol without IV contrast. COMPARISON:  Chest radiographs 03/24/2016. FINDINGS: CT CHEST FINDINGS Cardiovascular: Atherosclerosis of the aorta, great vessels and coronary arteries. Right IJ central venous catheter extends into the superior aspect of the right atrium. The heart size is normal. There is no pericardial effusion. Mediastinum/Nodes: There are no enlarged mediastinal, hilar or axillary lymph nodes. Endotracheal and nasogastric tubes are in place. There is possible wall thickening of the distal esophagus. The thyroid gland and trachea demonstrate no significant findings. Lungs/Pleura: There is no pleural effusion. Mild emphysema. There is a small calcified granuloma in the lingula on image 70 which may account for the radiographic finding. No suspicious pulmonary nodules. There is no confluent airspace opacity or pneumothorax. Musculoskeletal/Chest wall: Bilateral gynecomastia noted. Old rib fractures are present bilaterally. Fractures of the left eighth and ninth ribs may be incompletely healed. There is a fracture of the T12 vertebral body with associated mild osseous retropulsion. This is not clearly seen on chest radiographs from April and could be subacute in age. CT ABDOMEN AND PELVIS FINDINGS Hepatobiliary: Diffuse hepatic steatosis. No focal hepatic abnormalities are seen on noncontrast imaging. There is high-density material  layering within the gallbladder lumen. This is likely due to a combination of gallstones and vicarious excretion of contrast. No gallbladder wall thickening or significant biliary dilatation seen. Pancreas: Unremarkable. No pancreatic ductal dilatation or surrounding inflammatory changes. Spleen: Normal in size without focal abnormality. Adrenals/Urinary Tract: Both adrenal glands appear normal. There is contrast material within the renal collecting systems bilaterally, attributed to previous interventional procedure. Low-density renal lesions are present bilaterally, likely cysts. One in the lower pole of the left kidney measures 3.1 cm and 21 HU. No evidence of hydronephrosis. Bladder is partially decompressed by a Foley catheter. There is bladder wall thickening. Stomach/Bowel: The stomach is decompressed by a nasogastric tube. The proximal small bowel is fluid filled with scattered air-fluid levels and mild dilatation. There is an umbilical hernia containing small bowel, measuring up to 7.0 x 8.9 cm transverse. The distal small bowel is decompressed. No colonic abnormalities are seen. There is a small periampullary duodenal diverticulum. Vascular/Lymphatic: There are no enlarged abdominal or pelvic lymph nodes. Diffuse atherosclerosis of the aorta and its branches. TIPS extends from the portal vein to the junction of the IVC with the right hepatic vein. Patency not addressed by this examination. No evidence of surrounding hematoma. Probable upper abdominal collateral vessels/varices. Reproductive: Mild enlargement of the prostate gland which demonstrates dystrophic calcifications. The seminal vesicles appear unremarkable. Other: As above, umbilical hernia containing small bowel. There is a moderate amount of ascites, with most of the fluid in the pelvis. Musculoskeletal: No acute or significant osseous findings. IMPRESSION: 1. No suspicious pulmonary nodule. There is a small calcified granuloma in the lingula.  2. Umbilical hernia containing fluid-filled small bowel. There is proximal small bowel dilatation with air-fluid levels, supporting the presence of an incarcerated hernia. 3.  Moderate ascites, attributed to underlying liver disease. 4. T12 superior endplate compression fracture with osseous retropulsion, potentially subacute. 5. Indeterminate 3.1 cm left renal lesion, measuring higher than water density. Statistically, this is a complex cyst. 6. Additional incidental findings including emphysema, bilateral rib fractures, hepatic steatosis, diffuse atherosclerosis and bladder wall thickening. Electronically Signed: By: Carey BullocksWilliam  Veazey M.D. On: 03/24/2016 16:07   [place] Renal  Result Date: 03/25/2016 CLINICAL DATA:  Urinary retention.  Cirrhosis of liver. EXAM: RENAL / URINARY TRACT ULTRASOUND COMPLETE COMPARISON:  Ultrasound 03/22/2016. FINDINGS: Right Kidney: Length: 11.5 cm. Echogenicity within normal limits. No mass or hydronephrosis visualized. Left Kidney: Length: 13.0 cm. 3 cm left lower pole renal cyst. Echogenicity within normal limits. No mass or hydronephrosis visualized. Bladder: The urinary bladder empty.  Foley catheter in the bladder Mild ascites. IMPRESSION: No renal obstruction.  Urinary bladder empty with Foley catheter Mild ascites, significantly improved from ultrasound of 03/22/2016 Electronically Signed   By: Marlan Palauharles  Clark M.D.   On: 03/25/2016 07:54   [place] Paracentesis  Result Date: 03/22/2016 INDICATION: History of alcoholic cirrhosis with recurrent ascites. Request for therapeutic paracentesis EXAM: ULTRASOUND GUIDED RIGHT LATERAL ABDOMEN PARACENTESIS MEDICATIONS: 1% Lidocaine. COMPLICATIONS: None immediate. PROCEDURE: Informed written consent was obtained from the patient after a discussion of the risks, benefits and alternatives to treatment. A timeout was performed prior to the initiation of the procedure. Initial ultrasound scanning demonstrates a large amount of ascites within the  right lateral abdomen. The right lateral abdomen was prepped and draped in the usual sterile fashion. 1% lidocaine with epinephrine was used for local anesthesia. Following this, a 19 gauge, 7-cm, Yueh catheter was introduced. An ultrasound image was saved for documentation purposes. The paracentesis was performed. The catheter was removed and a dressing was applied. The patient tolerated the procedure well without immediate post procedural complication. FINDINGS: A total of approximately 2.4 liters of amber fluid was removed. IMPRESSION: Successful ultrasound-guided paracentesis yielding 2.4 liters of peritoneal fluid. Read by:  Corrin ParkerWendy Blair, PA-C Electronically Signed   By: Jolaine ClickArthur  Hoss M.D.   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 Recurrent large volume ascites requiring nearly weekly paracentesis. See previous consultation. EXAM: TRANSJUGULAR INTRAHEPATIC PORTOSYSTEMIC SHUNT MEDICATIONS: As antibiotic prophylaxis, cefazolin 2 g was ordered pre-procedure and administered intravenously within one hour of incision. ANESTHESIA/SEDATION: General - as administered by the Anesthesia department CONTRAST:  80 mL Isovue FLUOROSCOPY TIME:  7 minutes 18 seconds, 895 mGy COMPLICATIONS: None immediate. PROCEDURE: Informed written consent was obtained from the patient after a thorough discussion of the procedural risks, benefits and alternatives. All questions were addressed. Maximal Sterile Barrier Technique was utilized including caps, mask, sterile gowns, sterile gloves, sterile drape, hand hygiene and skin antiseptic. A timeout was performed prior to the initiation of the procedure. Under ultrasound guidance, a 21 gauge micropuncture needle was advanced percutaneously into a right portal vein branch. A 018 guidewire was advanced into the main portal vein. 3French micro dilator was used for contrast injection to confirm portal venous positioning. A 5  French micropuncture dilator was placed with Touhy attachment. Confirmatory portal venography performed. Under ultrasound guidance, the right IJ vein was localized. The vessel was accessed with a 21-gauge micropuncture needle, exchanged over a 018 guidewire for a transitional dilator, through which a 035 guidewire was advanced. After tract dilatation, a 10 French curved vascular sheath was placed, advanced into the IVC. Through this, a 5 French angled angiographic  catheter was advanced and used to catheterize the right hepatic vein. Confirmatory right hepatic venography performed. Portal and hepatic venous pressure measurements were obtained, demonstrating mean portosystemic gradient. The 018 guidewire was positioned with its transition point at the confluence of anterior and posterior right portal vein branches as a target. The [place] needle and sheath were advanced through the primary sheath, and the needle advanced into the right portal vein branch under biplane fluoroscopy guidance. After confirmatory contrast injection, an angled stiff glidewire was advanced into the splenic vein. The needle was exchanged for the 5 French angiographic catheter, advanced into the splenic vein for confirmatory portal venography. This demonstrated multiple esophageal varices and slow antegrade flow through the portal venous system. Measuring pigtail catheter advanced for planning portal venogram. This was exchanged for a a 7 mm Mustang angioplasty catheter used to dilate the parenchymal tract. This allowed advancement of the 10 French sheath into the portal vein. The 10 mm x 8 cm ViaTorr stent was advanced and deployed from right portal vein to right hepatic vein. The stent was dilated with a 10 mm angioplasty balloon. Follow-up pigtail catheter portal venogram demonstrates brisk antegrade flow through the TIPS, much less filling of a variceal channels, and minimal antegrade flow into intrahepatic portal vein branches.  There is a nonocclusive mural thrombus near the portosplenic confluence. Pressure measurements reveal a 6 mm Hg portosystemic gradient. The pigtail catheter was withdrawn. The sheath was exchanged for a 25 cm 10 French sheath left with the tip at the SVC/RA junction as venous access. The sheath was secured externally with 0 Prolene suture and placed to a saline flush. The patient tolerated the procedure well. He was transferred to CT for previously planned imaging. IMPRESSION: 1. 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 Outpatient follow-up with a TIPS ultrasound in 1 month. Electronically Signed   By: Corlis Leak M.D.   On: 03/24/2016 16:37   Dg Chest Port 1 View  Result Date: 03/24/2016 CLINICAL DATA:  Postop, line placement EXAM: PORTABLE CHEST 1 VIEW COMPARISON:  03/24/2016 FINDINGS: Endotracheal tube is 5 cm above the carina. NG tube enters the stomach. Right internal jugular vascular sheath in place with the tip in the upper right atrium. No pneumothorax. Heart is normal size. No confluent airspace opacities or effusions. No acute bony abnormality. Old left rib fractures again noted, unchanged. IMPRESSION: No active disease. Electronically Signed   By: Charlett Nose M.D.   On: 03/24/2016 16:48    Labs:  CBC:  Recent Labs  09/22/15 0356 02/05/16 1010 03/24/16 0816 03/25/16 0440  WBC 8.4 6.3 11.5* 9.7  HGB 11.0* 12.3* 17.6* 13.2  HCT 32.0* 38.0* 47.8 38.7*  PLT 100* 152 143* 67*    COAGS:  Recent Labs  09/16/15 1719 10/08/15 1306 02/05/16 1010 03/24/16 0816 03/25/16 0440  INR 1.36 1.17 1.19 1.47 1.88  APTT 37  --  35 38* 49*    BMP:  Recent Labs  10/08/15 1306 02/05/16 1010 03/24/16 0816 03/25/16 0440  NA 136 129* 125* 129*  K 3.9 4.1 3.3* 2.9*  CL 107 99* 83* 94*  CO2 20 27 26 28     GLUCOSE 115* 91 152* 132*  BUN 19 <5* 9 6  CALCIUM 7.7* 8.3* 8.9 7.4*  CREATININE 0.90 0.53* 1.34* 0.60*  GFRNONAA >89 >60 58* >60  GFRAA >89 >60 >60 >60  LIVER FUNCTION TESTS:  Recent Labs  10/08/15 1306 02/05/16 1010 03/24/16 0816 03/25/16 0440  BILITOT 0.6 0.6 2.0* 2.8*  AST 23 24 107* 114*  ALT 13 11* 47 33  ALKPHOS 72 89 200* 114  PROT 5.3* 5.5* 7.8 5.4*  ALBUMIN 2.5* 2.1* 3.0* 2.7*    Assessment and Plan: Hx cirrhosis/refractory ascites, incarcerated umbilical hernia; s/p TIPS 10/27, repair of umbilical hernia 10/28, extubated 10/28; AF; lactc acid 2.1; PT 21.8/INR 1.88, WBC 9.7, hgb 13.2, plts 67k, creat .60, t bili 2.8, K 2.9- replace; appreciate excellent care from CCM/IM/CCS; will schedule pt for f/u TIPS US and IR clinic visit with Dr. Deanne CofferHassell in 4 weeks. Cont to monitor CBC/CMP/PT; check ammonia   Electronically Signed: D. Jeananne RamaKevin Maanvi Lecompte 03/25/2016, 12:36 PM   I spent a total of 20 minutes at the the patient's bedside AND on the patient's hospital floor or unit, greater than 50% of which was counseling/coordinating care for TIPS    Patient ID: [name], male   DOB: 1961-03-15, 55 y.o.   MRN: [id number]

## 2016-03-25 NOTE — Transfer of Care (Signed)
Immediate Anesthesia Transfer of Care Note  Patient: Libby MawRobert Habib  Procedure(s) Performed: Procedure(s): HERNIA REPAIR UMBILICAL INCARCERATED (N/A)  Patient Location: SICU  Anesthesia Type:General  Level of Consciousness: sedated and Patient remains intubated per anesthesia plan  Airway & Oxygen Therapy: Patient remains intubated per anesthesia plan and Patient placed on Ventilator (see vital sign flow sheet for setting)  Post-op Assessment: Report given to RN and Post -op Vital signs reviewed and stable  Post vital signs: Reviewed and stable  Last Vitals:  Vitals:   03/25/16 0100 03/25/16 0200  BP: (!) 82/56 107/76  Pulse: 79 63  Resp: 16 16  Temp:      Last Pain:  Vitals:   03/24/16 2325  TempSrc: Oral  PainSc:       Patients Stated Pain Goal: 8 (03/24/16 0844)  Complications: No apparent anesthesia complications

## 2016-03-26 DIAGNOSIS — E876 Hypokalemia: Secondary | ICD-10-CM

## 2016-03-26 DIAGNOSIS — J9611 Chronic respiratory failure with hypoxia: Secondary | ICD-10-CM

## 2016-03-26 LAB — AMMONIA: Ammonia: 99 umol/L — ABNORMAL HIGH (ref 9–35)

## 2016-03-26 LAB — BASIC METABOLIC PANEL
ANION GAP: 5 (ref 5–15)
BUN: <5 mg/dL — ABNORMAL LOW (ref 6–20)
CALCIUM: 6.8 mg/dL — AB (ref 8.9–10.3)
CO2: 23 mmol/L (ref 22–32)
Chloride: 103 mmol/L (ref 101–111)
Creatinine, Ser: 0.46 mg/dL — ABNORMAL LOW (ref 0.61–1.24)
GLUCOSE: 158 mg/dL — AB (ref 65–99)
POTASSIUM: 3.6 mmol/L (ref 3.5–5.1)
SODIUM: 131 mmol/L — AB (ref 135–145)

## 2016-03-26 LAB — GLUCOSE, CAPILLARY
GLUCOSE-CAPILLARY: 139 mg/dL — AB (ref 65–99)
GLUCOSE-CAPILLARY: 144 mg/dL — AB (ref 65–99)
GLUCOSE-CAPILLARY: 152 mg/dL — AB (ref 65–99)
GLUCOSE-CAPILLARY: 162 mg/dL — AB (ref 65–99)
Glucose-Capillary: 143 mg/dL — ABNORMAL HIGH (ref 65–99)

## 2016-03-26 LAB — CBC WITH DIFFERENTIAL/PLATELET
BASOS ABS: 0 10*3/uL (ref 0.0–0.1)
Basophils Relative: 0 %
EOS PCT: 0 %
Eosinophils Absolute: 0 10*3/uL (ref 0.0–0.7)
HCT: 38.9 % — ABNORMAL LOW (ref 39.0–52.0)
HEMOGLOBIN: 13 g/dL (ref 13.0–17.0)
LYMPHS ABS: 0.9 10*3/uL (ref 0.7–4.0)
LYMPHS PCT: 9 %
MCH: 31.3 pg (ref 26.0–34.0)
MCHC: 33.4 g/dL (ref 30.0–36.0)
MCV: 93.5 fL (ref 78.0–100.0)
Monocytes Absolute: 0.7 10*3/uL (ref 0.1–1.0)
Monocytes Relative: 7 %
NEUTROS ABS: 8.4 10*3/uL — AB (ref 1.7–7.7)
NEUTROS PCT: 84 %
PLATELETS: 47 10*3/uL — AB (ref 150–400)
RBC: 4.16 MIL/uL — AB (ref 4.22–5.81)
RDW: 14.4 % (ref 11.5–15.5)
WBC: 10 10*3/uL (ref 4.0–10.5)

## 2016-03-26 LAB — HEPATIC FUNCTION PANEL
ALBUMIN: 2.3 g/dL — AB (ref 3.5–5.0)
ALT: 54 U/L (ref 17–63)
AST: 148 U/L — ABNORMAL HIGH (ref 15–41)
Alkaline Phosphatase: 116 U/L (ref 38–126)
BILIRUBIN INDIRECT: 1.6 mg/dL — AB (ref 0.3–0.9)
Bilirubin, Direct: 1.1 mg/dL — ABNORMAL HIGH (ref 0.1–0.5)
TOTAL PROTEIN: 5 g/dL — AB (ref 6.5–8.1)
Total Bilirubin: 2.7 mg/dL — ABNORMAL HIGH (ref 0.3–1.2)

## 2016-03-26 LAB — PROTIME-INR
INR: 2.09
Prothrombin Time: 23.8 seconds — ABNORMAL HIGH (ref 11.4–15.2)

## 2016-03-26 LAB — TRIGLYCERIDES: TRIGLYCERIDES: 37 mg/dL (ref <150)

## 2016-03-26 LAB — PROCALCITONIN: PROCALCITONIN: 0.17 ng/mL

## 2016-03-26 MED ORDER — FAMOTIDINE 20 MG PO TABS
20.0000 mg | ORAL_TABLET | Freq: Two times a day (BID) | ORAL | Status: DC
  Administered 2016-03-26 – 2016-04-01 (×13): 20 mg via ORAL
  Filled 2016-03-26 (×13): qty 1

## 2016-03-26 MED ORDER — METHYLPREDNISOLONE SODIUM SUCC 125 MG IJ SOLR
INTRAMUSCULAR | Status: AC
  Filled 2016-03-26: qty 2

## 2016-03-26 NOTE — Progress Notes (Signed)
PULMONARY / CRITICAL CARE MEDICINE   Name: [name] MRN: [id number] DOB: [birth year]    ADMISSION DATE:  03/24/2016 CONSULTATION DATE:  03/24/16  REFERRING MD:  Dyanne Carrel, NP-C / TRH   CHIEF COMPLAINT:  ETOH cirrhosis s/p TIPS, incarcerated umbilical hernia   HISTORY OF PRESENT ILLNESS:   55 y/o M, previously homeless (living with his brother currently), followed at [facility] with a PMH of depression, GERD, constipation, hypertension, COPD and ETOH cirrhosis with persistent ascites requiring weekly paracentesis who presented to [facility] on 10/27 for planned TIPS procedure.    On arrival for procedure, labs were assessed and showed acute kidney injury (sr cr up to 1.34 from 0.53), hyponatremia (125), hypokalemia (3.3), glucose 152, alk phos 200, albumin 3.0, AST 107 / ALT 47, WBC 11.5, Hgb 17.6, platelets 143 and INR 1.47.  CXR showed a 75m pulmonary nodule on the left, no acute consolidation. The patient had previously been followed by hospice but was taken off for TIPS procedure.  Per admit notes, he reported increased abdominal pain after his last paracentesis (10/27), difficulty initiating flow with urination / incomplete emptying and his umbilical hernia seemed "bigger" than usual.  [facility].  Plan of care was to proceed with TIPS procedure 10/27.  He was electively intubated per anesthesia for the procedure.  During the imaging assessment for TIPS, it was discovered that he had an incarcerated hernia.  CCS was consulted for evaluation of incarcerated hernia.  It was anticipated that he will return post procedure to ICU on mechanical ventilation, PCCM consulted.    SUBJECTIVE:  Extubated 10/28 , sitting up in chair talking with family , O2 sats nml on RA  Tolerating diet w/ no n/v/d.  Weaned off pressors last pm  VITAL SIGNS: BP (!) 79/65   Pulse 73   Temp 97.5 F (36.4 C) (Oral)   Resp 19   Ht 6' 1"  (1.854 m)   Wt 146  lb 6.2 oz (66.4 kg)   SpO2 98%   BMI 19.31 kg/m   HEMODYNAMICS:    VENTILATOR SETTINGS:    INTAKE / OUTPUT: I/O last 3 completed shifts: In: 5939.2 [P.O.:1080; I.V.:4109.2; IV Piggyback:750] Out: 42947[Urine:3650; Emesis/NG output:950; Blood:30]  PHYSICAL EXAMINATION: General: Patient awake and up in chair  Neuro: FC well, nonfocal HEENT: jvd wnl PULM: cta anterior CV: s1 s2 RRR GI: sofr, wound clean, BS low, no r, mild distention Extremities:  No edmea   LABS:  BMET  Recent Labs Lab 03/24/16 0816 03/25/16 0440 03/26/16 0400  NA 125* 129* 131*  K 3.3* 2.9* 3.6  CL 83* 94* 103  CO2 26 28 23   BUN 9 6 <5*  CREATININE 1.34* 0.60* 0.46*  GLUCOSE 152* 132* 158*    Electrolytes  Recent Labs Lab 03/24/16 0816 03/25/16 0440 03/26/16 0400  CALCIUM 8.9 7.4* 6.8*  MG  --  1.5*  --   PHOS  --  2.3*  --     CBC  Recent Labs Lab 03/24/16 0816 03/25/16 0440 03/26/16 0952  WBC 11.5* 9.7 10.0  HGB 17.6* 13.2 13.0  HCT 47.8 38.7* 38.9*  PLT 143* 67* 47*    Coag's  Recent Labs Lab 03/24/16 0816 03/25/16 0440 03/26/16 0952  APTT 38* 49*  --   INR 1.47 1.88 2.09    Sepsis Markers  Recent Labs Lab 03/24/16 1700 03/25/16 0440 03/25/16 0900 03/26/16 0400  LATICACIDVEN  --   --  2.1*  --  PROCALCITON 0.27 0.62  --  0.17    ABG  Recent Labs Lab 03/24/16 1730  PHART 7.466*  PCO2ART 41.2  PO2ART 224*    Liver Enzymes  Recent Labs Lab 03/24/16 0816 03/25/16 0440 03/26/16 0952  AST 107* 114* 148*  ALT 47 33 54  ALKPHOS 200* 114 116  BILITOT 2.0* 2.8* 2.7*  ALBUMIN 3.0* 2.7* 2.3*    Cardiac Enzymes  Recent Labs Lab 03/24/16 1731 03/24/16 2350 03/25/16 0440  TROPONINI 0.04* 0.05* 0.03*    Glucose  Recent Labs Lab 03/25/16 0826 03/25/16 1253 03/25/16 1637 03/25/16 1952 03/26/16 0357 03/26/16 0801  GLUCAP 102* 118* 135* 136* 152*  162* 143*    Imaging No results found.   STUDIES:  CT CHEST/ABD/PELVIS W/O  10/27: IMPRESSION: 1. No suspicious pulmonary nodule. There is a small calcified granuloma in the lingula. 2. Umbilical hernia containing fluid-filled small bowel. There is proximal small bowel dilatation with air-fluid levels, supporting the presence of an incarcerated hernia. 3. Moderate ascites, attributed to underlying liver disease. 4. T12 superior endplate compression fracture with osseous retropulsion, potentially subacute. 5. Indeterminate 3.1 cm left renal lesion, measuring higher than water density. Statistically, this is a complex cyst. 6. Additional incidental findings including emphysema, bilateral rib fractures, hepatic steatosis, diffuse atherosclerosis and bladder wall thickening.  10/28 Renal US neg for obstruction , mild ascites, improved   MICROBIOLOGY: MRSA PCR 10/27 >> Blood Ctx x2 10/27 >>  ANTIBIOTICS: Vancomycin 10/27 >> Zosyn 10/27 >>  SIGNIFICANT EVENTS: 10/03 - Paracentesis 4.1L yellow fluid 10/10 - Paracentesis w/ 4L amber fluid 10/16 - Paracentesis w/ 4L yellow fluid 10/25 - Paracentesis w/ 2.4L amber fluid 10/27 - Admit for TIPS, AKI, found to have incarcerated hernia / s/p TIPS, tops performed and hernia repair 10/28 Echo EF 60-65%   LINES/TUBES: OETT 7.5 10/27 >> 10/28 R IJ CVL (single lumen post TIPS) 10/27 >> L Radial Art Line 10/27 >> OGT 10/27 >>10/28 Foley 10/27 >> PIV x2   DISCUSSION: 55 y/o M with PMH of ETOH cirrhosis, previously on hospice, presented 10/27 for planned TIPS.  Ultimately admitted for acute renal failure.  Found to have incarcerated hernia during imaging for TIPS.  CCS evaluated, pending decision regarding surgical intervention. Starting broad-spectrum antibiotics. Pt improved with extubation 10/28 and off pressors 10/28.    ASSESSMENT / PLAN:  PULMONARY A: H/O COPD - Emphysema seen on CT scan today. Tobacco Abuse  Acute resp failure resolved , extubated 10/28  P:   O2 to keep sat >90%  Even balance  goals  CARDIOVASCULAR A:  Shock - Resolved, off pressors late 10/28  H/O HTN Sirs response Has rel AI P:   stress roids Monitor   RENAL A:   Acute Renal Failure - Possible Hepato-Renal Syndrome ( unlikely)>resolved w/ good uop and scr return to nml  Hypokalemia - resolved   P:    Albumin Renal U/S neg Ensure urine na  GASTROINTESTINAL A:   ETOH Cirrhosis w/ Ascites - Requiring weekly paracentesis. Last paracentesis 10/25. Incarcerated Umbilical Hernia s/p surgery 10/28  Transaminitis H/O GERD  P:   Diet as tolerated  Change to Pepcid oral    HEMATOLOGIC A:   Leukocytosis - Likely early sepsis versus demargination.>improved  Thrombocytopenia - dilution, sirs, cirhosis  Coagulopathy - Likely some element of synthetic dysfunction w/ INR 1.47 & Albumin 2.1.  P:  Cbc in am  SCDs Remain off Heparin  INFECTIOUS A:   Possible Sepsis - From strangulated umbilical hernia. Possible SBP -  Multiple paracenteses.   P:   Follow cultures Pct low ABX, re assess need for any abx in am   ENDOCRINE A:   Hyperglycemia - No h/o DM. Risk for Hypoglycemia Rel AI P:    stress roids started 10/28  ssi  NEUROLOGIC A:   Sedation on Ventilator>resolved 10/28  ETOH Abuse H/O Depression  High risk enceph post tips P:    Fentanyl IV prn pain/discomfort Thiamine & Folic Acid IV daily CIWA Protocol once OUT of icu    Tammy Parrett NP-C   Pulmonary and Critical Care  [phone]    Attending Note:  55 year old with liver failure and ascites who underwent a TIPS procedure.  Was noted to have an incarcerated hernia and was taken to the OR for repair and was extubated on 10/28.  On exam, abdomen is benign and lungs are clear.  I reviewed CXR myself, no acute disease noted.  Discussed with PCCM-NP and bedside RN.  Acute respiratory failure:  - Titrate O2 for sat of 88-92%.  - Keep fluid even at this point.  Liver failure:  - TIPS done, no ascites noted.  -  Monitor INR.  Hypokalemia:  - Replace.  - BMET in AM.  Hernia: incarcerated.  - Per surgery  - Diet as ordered.  Ok to transfer to SDU from [place].  PCCM will sign off, please call back if needed.   Patient seen and examined, agree with above note.  I dictated the care and orders written for this patient under my direction.  Rush Farmer, MD [phone]

## 2016-03-26 NOTE — Progress Notes (Signed)
Referring Physician(s): [name],E  Supervising Physician: Jolaine ClickHoss, Arthur  Patient Status:  [facility]  Chief Complaint:  Cirrhosis, refractory ascites, incarcerated umbilical hernia  Subjective: Patient doing fairly well today ;denies worsening abdominal pain, significant dyspnea, nausea or vomiting. Eager to go home.   Allergies: Review of patient's allergies indicates no known allergies.  Medications: Prior to Admission medications   Medication Sig Start Date End Date Taking? Authorizing Provider  albuterol (PROVENTIL HFA;VENTOLIN HFA) 108 (90 Base) MCG/ACT inhaler Inhale 2 puffs into the lungs every 6 (six) hours as needed for wheezing or shortness of breath. 12/02/15  Yes [name], MD  citalopram (CELEXA) 20 MG tablet Take 1 tablet (20 mg total) by mouth daily. Patient taking differently: Take 20 mg by mouth every evening.  03/10/16  Yes [name], MD  diclofenac sodium (VOLTAREN) 1 % GEL Apply 4 g topically 4 (four) times daily. Patient taking differently: Apply 4 g topically 4 (four) times daily as needed (for pain).  10/21/15  Yes [name], MD  furosemide (LASIX) 20 MG tablet TAKE 1 TABLET BY MOUTH DAILY. Patient taking differently: TAKE 1 TABLET BY MOUTH DAILY IN THE AFTERNOON 03/02/16  Yes [name], MD  lactulose (CHRONULAC) 10 GM/15ML solution Take 15 mLs (10 g total) by mouth 2 (two) times daily. 07/22/15  Yes [name], MD  potassium chloride SA (K-DUR,KLOR-CON) 20 MEQ tablet Take 1 tablet (20 mEq total) by mouth daily. Patient taking differently: Take 20 mEq by mouth every evening.  03/10/16  Yes [name], MD  ranitidine (ZANTAC) 150 MG tablet Take 1 tablet (150 mg total) by mouth 2 (two) times daily. 11/22/15  Yes [name], MD  spironolactone (ALDACTONE) 50 MG tablet Take 1 tablet (50 mg total) by mouth daily. 03/10/16  Yes [name], MD     Vital Signs: BP (!) 79/65   Pulse 73   Temp 97.7 F (36.5 C) (Oral)   Resp 19   Ht 6\' 1"  (1.854  m)   Wt 146 lb 6.2 oz (66.4 kg)   SpO2 98%   BMI 19.31 kg/m   Physical Exam awake, alert. Right IJ venous cath intact, nontender. Abdomen soft, slightly distended, nontender, midline wound with intact staples, no lower extremity edema  Imaging: Ct Abdomen Pelvis Wo Contrast  Addendum Date: 03/24/2016   ADDENDUM REPORT: 03/24/2016 16:37 ADDENDUM: These results were called by telephone at the time of interpretation on 03/24/2016 at 4:20 pm to Dr. Claud KelpHaywood Ingram, who verbally acknowledged these results. Electronically Signed   By: Carey BullocksWilliam  Veazey M.D.   On: 03/24/2016 16:37   Result Date: 03/24/2016 CLINICAL DATA:  Incarcerated umbilical hernia. Left lung nodule. Post TIPS procedure today. EXAM: CT CHEST, ABDOMEN AND PELVIS WITHOUT CONTRAST TECHNIQUE: Multidetector CT imaging of the chest, abdomen and pelvis was performed following the standard protocol without IV contrast. COMPARISON:  Chest radiographs 03/24/2016. FINDINGS: CT CHEST FINDINGS Cardiovascular: Atherosclerosis of the aorta, great vessels and coronary arteries. Right IJ central venous catheter extends into the superior aspect of the right atrium. The heart size is normal. There is no pericardial effusion. Mediastinum/Nodes: There are no enlarged mediastinal, hilar or axillary lymph nodes. Endotracheal and nasogastric tubes are in place. There is possible wall thickening of the distal esophagus. The thyroid gland and trachea demonstrate no significant findings. Lungs/Pleura: There is no pleural effusion. Mild emphysema. There is a small calcified granuloma in the lingula on image 70 which may account for the radiographic finding. No suspicious pulmonary nodules.  There is no confluent airspace opacity or pneumothorax. Musculoskeletal/Chest wall: Bilateral gynecomastia noted. Old rib fractures are present bilaterally. Fractures of the left eighth and ninth ribs may be incompletely healed. There is a fracture of the T12 vertebral body with  associated mild osseous retropulsion. This is not clearly seen on chest radiographs from April and could be subacute in age. CT ABDOMEN AND PELVIS FINDINGS Hepatobiliary: Diffuse hepatic steatosis. No focal hepatic abnormalities are seen on noncontrast imaging. There is high-density material layering within the gallbladder lumen. This is likely due to a combination of gallstones and vicarious excretion of contrast. No gallbladder wall thickening or significant biliary dilatation seen. Pancreas: Unremarkable. No pancreatic ductal dilatation or surrounding inflammatory changes. Spleen: Normal in size without focal abnormality. Adrenals/Urinary Tract: Both adrenal glands appear normal. There is contrast material within the renal collecting systems bilaterally, attributed to previous interventional procedure. Low-density renal lesions are present bilaterally, likely cysts. One in the lower pole of the left kidney measures 3.1 cm and 21 HU. No evidence of hydronephrosis. Bladder is partially decompressed by a Foley catheter. There is bladder wall thickening. Stomach/Bowel: The stomach is decompressed by a nasogastric tube. The proximal small bowel is fluid filled with scattered air-fluid levels and mild dilatation. There is an umbilical hernia containing small bowel, measuring up to 7.0 x 8.9 cm transverse. The distal small bowel is decompressed. No colonic abnormalities are seen. There is a small periampullary duodenal diverticulum. Vascular/Lymphatic: There are no enlarged abdominal or pelvic lymph nodes. Diffuse atherosclerosis of the aorta and its branches. TIPS extends from the portal vein to the junction of the IVC with the right hepatic vein. Patency not addressed by this examination. No evidence of surrounding hematoma. Probable upper abdominal collateral vessels/varices. Reproductive: Mild enlargement of the prostate gland which demonstrates dystrophic calcifications. The seminal vesicles appear unremarkable.  Other: As above, umbilical hernia containing small bowel. There is a moderate amount of ascites, with most of the fluid in the pelvis. Musculoskeletal: No acute or significant osseous findings. IMPRESSION: 1. No suspicious pulmonary nodule. There is a small calcified granuloma in the lingula. 2. Umbilical hernia containing fluid-filled small bowel. There is proximal small bowel dilatation with air-fluid levels, supporting the presence of an incarcerated hernia. 3. Moderate ascites, attributed to underlying liver disease. 4. T12 superior endplate compression fracture with osseous retropulsion, potentially subacute. 5. Indeterminate 3.1 cm left renal lesion, measuring higher than water density. Statistically, this is a complex cyst. 6. Additional incidental findings including emphysema, bilateral rib fractures, hepatic steatosis, diffuse atherosclerosis and bladder wall thickening. Electronically Signed: By: Carey Bullocks M.D. On: 03/24/2016 16:07   Dg Chest 2 View  Result Date: 03/24/2016 CLINICAL DATA:  Pre op today for TIPS,copd,liver dz,htn,,weakness today EXAM: CHEST  2 VIEW COMPARISON:  09/05/2015 FINDINGS: Lungs are hyperinflated. Heart size is normal. No focal consolidations or pleural effusions are identified. Overlying the left mid lung zone there is a 9 mm nodule. Remote rib fractures. IMPRESSION: 1. 9 mm nodule overlying the left mid lung zone. As needed, further characterization can be performed with CT of the chest. Intravenous contrast is recommended unless it is contraindicated. 2. No acute consolidation. These results will be called to the ordering clinician or representative by the Radiologist Assistant, and communication documented in the PACS or zVision Dashboard. Electronically Signed   By: Norva Pavlov M.D.   On: 03/24/2016 08:51   Ct Chest Wo Contrast  Addendum Date: 03/24/2016   ADDENDUM REPORT: 03/24/2016 16:37 ADDENDUM: These results were  called by telephone at the time of  interpretation on 03/24/2016 at 4:20 pm to Dr. Claud Kelp, who verbally acknowledged these results. Electronically Signed   By: Carey Bullocks M.D.   On: 03/24/2016 16:37   Result Date: 03/24/2016 CLINICAL DATA:  Incarcerated umbilical hernia. Left lung nodule. Post TIPS procedure today. EXAM: CT CHEST, ABDOMEN AND PELVIS WITHOUT CONTRAST TECHNIQUE: Multidetector CT imaging of the chest, abdomen and pelvis was performed following the standard protocol without IV contrast. COMPARISON:  Chest radiographs 03/24/2016. FINDINGS: CT CHEST FINDINGS Cardiovascular: Atherosclerosis of the aorta, great vessels and coronary arteries. Right IJ central venous catheter extends into the superior aspect of the right atrium. The heart size is normal. There is no pericardial effusion. Mediastinum/Nodes: There are no enlarged mediastinal, hilar or axillary lymph nodes. Endotracheal and nasogastric tubes are in place. There is possible wall thickening of the distal esophagus. The thyroid gland and trachea demonstrate no significant findings. Lungs/Pleura: There is no pleural effusion. Mild emphysema. There is a small calcified granuloma in the lingula on image 70 which may account for the radiographic finding. No suspicious pulmonary nodules. There is no confluent airspace opacity or pneumothorax. Musculoskeletal/Chest wall: Bilateral gynecomastia noted. Old rib fractures are present bilaterally. Fractures of the left eighth and ninth ribs may be incompletely healed. There is a fracture of the T12 vertebral body with associated mild osseous retropulsion. This is not clearly seen on chest radiographs from April and could be subacute in age. CT ABDOMEN AND PELVIS FINDINGS Hepatobiliary: Diffuse hepatic steatosis. No focal hepatic abnormalities are seen on noncontrast imaging. There is high-density material layering within the gallbladder lumen. This is likely due to a combination of gallstones and vicarious excretion of contrast.  No gallbladder wall thickening or significant biliary dilatation seen. Pancreas: Unremarkable. No pancreatic ductal dilatation or surrounding inflammatory changes. Spleen: Normal in size without focal abnormality. Adrenals/Urinary Tract: Both adrenal glands appear normal. There is contrast material within the renal collecting systems bilaterally, attributed to previous interventional procedure. Low-density renal lesions are present bilaterally, likely cysts. One in the lower pole of the left kidney measures 3.1 cm and 21 HU. No evidence of hydronephrosis. Bladder is partially decompressed by a Foley catheter. There is bladder wall thickening. Stomach/Bowel: The stomach is decompressed by a nasogastric tube. The proximal small bowel is fluid filled with scattered air-fluid levels and mild dilatation. There is an umbilical hernia containing small bowel, measuring up to 7.0 x 8.9 cm transverse. The distal small bowel is decompressed. No colonic abnormalities are seen. There is a small periampullary duodenal diverticulum. Vascular/Lymphatic: There are no enlarged abdominal or pelvic lymph nodes. Diffuse atherosclerosis of the aorta and its branches. TIPS extends from the portal vein to the junction of the IVC with the right hepatic vein. Patency not addressed by this examination. No evidence of surrounding hematoma. Probable upper abdominal collateral vessels/varices. Reproductive: Mild enlargement of the prostate gland which demonstrates dystrophic calcifications. The seminal vesicles appear unremarkable. Other: As above, umbilical hernia containing small bowel. There is a moderate amount of ascites, with most of the fluid in the pelvis. Musculoskeletal: No acute or significant osseous findings. IMPRESSION: 1. No suspicious pulmonary nodule. There is a small calcified granuloma in the lingula. 2. Umbilical hernia containing fluid-filled small bowel. There is proximal small bowel dilatation with air-fluid levels,  supporting the presence of an incarcerated hernia. 3. Moderate ascites, attributed to underlying liver disease. 4. T12 superior endplate compression fracture with osseous retropulsion, potentially subacute. 5. Indeterminate 3.1 cm left  renal lesion, measuring higher than water density. Statistically, this is a complex cyst. 6. Additional incidental findings including emphysema, bilateral rib fractures, hepatic steatosis, diffuse atherosclerosis and bladder wall thickening. Electronically Signed: By: Carey Bullocks M.D. On: 03/24/2016 16:07   US Renal  Result Date: 03/25/2016 CLINICAL DATA:  Urinary retention.  Cirrhosis of liver. EXAM: RENAL / URINARY TRACT ULTRASOUND COMPLETE COMPARISON:  Ultrasound 03/22/2016. FINDINGS: Right Kidney: Length: 11.5 cm. Echogenicity within normal limits. No mass or hydronephrosis visualized. Left Kidney: Length: 13.0 cm. 3 cm left lower pole renal cyst. Echogenicity within normal limits. No mass or hydronephrosis visualized. Bladder: The urinary bladder empty.  Foley catheter in the bladder Mild ascites. IMPRESSION: No renal obstruction.  Urinary bladder empty with Foley catheter Mild ascites, significantly improved from ultrasound of 03/22/2016 Electronically Signed   By: Marlan Palau M.D.   On: 03/25/2016 07:54   US Paracentesis  Result Date: 03/22/2016 INDICATION: History of alcoholic cirrhosis with recurrent ascites. Request for therapeutic paracentesis EXAM: ULTRASOUND GUIDED RIGHT LATERAL ABDOMEN PARACENTESIS MEDICATIONS: 1% Lidocaine. COMPLICATIONS: None immediate. PROCEDURE: Informed written consent was obtained from the patient after a discussion of the risks, benefits and alternatives to treatment. A timeout was performed prior to the initiation of the procedure. Initial ultrasound scanning demonstrates a large amount of ascites within the right lateral abdomen. The right lateral abdomen was prepped and draped in the usual sterile fashion. 1% lidocaine with  epinephrine was used for local anesthesia. Following this, a 19 gauge, 7-cm, Yueh catheter was introduced. An ultrasound image was saved for documentation purposes. The paracentesis was performed. The catheter was removed and a dressing was applied. The patient tolerated the procedure well without immediate post procedural complication. FINDINGS: A total of approximately 2.4 liters of amber fluid was removed. IMPRESSION: Successful ultrasound-guided paracentesis yielding 2.4 liters of peritoneal fluid. Read by:  Corrin Parker, PA-C Electronically Signed   By: Jolaine Click M.D.   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 Recurrent large volume ascites requiring nearly weekly paracentesis. See previous consultation. EXAM: TRANSJUGULAR INTRAHEPATIC PORTOSYSTEMIC SHUNT MEDICATIONS: As antibiotic prophylaxis, cefazolin 2 g was ordered pre-procedure and administered intravenously within one hour of incision. ANESTHESIA/SEDATION: General - as administered by the Anesthesia department CONTRAST:  80 mL Isovue FLUOROSCOPY TIME:  7 minutes 18 seconds, 895 mGy COMPLICATIONS: None immediate. PROCEDURE: Informed written consent was obtained from the patient after a thorough discussion of the procedural risks, benefits and alternatives. All questions were addressed. Maximal Sterile Barrier Technique was utilized including caps, mask, sterile gowns, sterile gloves, sterile drape, hand hygiene and skin antiseptic. A timeout was performed prior to the initiation of the procedure. Under ultrasound guidance, a 21 gauge micropuncture needle was advanced percutaneously into a right portal vein branch. A 018 guidewire was advanced into the main portal vein. 3French micro dilator was used for contrast injection to confirm portal venous positioning. A 5 French micropuncture dilator was placed with Touhy attachment. Confirmatory portal venography performed. Under  ultrasound guidance, the right IJ vein was localized. The vessel was accessed with a 21-gauge micropuncture needle, exchanged over a 018 guidewire for a transitional dilator, through which a 035 guidewire was advanced. After tract dilatation, a 10 French curved vascular sheath was placed, advanced into the IVC. Through this, a 5 French angled angiographic catheter was advanced and used to catheterize the right hepatic vein. Confirmatory right hepatic venography performed. Portal and hepatic venous pressure measurements were  obtained, demonstrating 14mmHg mean portosystemic gradient. The 018 guidewire was positioned with its transition point at the confluence of anterior and posterior right portal vein branches as a target. The [place] needle and sheath were advanced through the primary sheath, and the needle advanced into the right portal vein branch under biplane fluoroscopy guidance. After confirmatory contrast injection, an angled stiff glidewire was advanced into the splenic vein. The needle was exchanged for the 5 French angiographic catheter, advanced into the splenic vein for confirmatory portal venography. This demonstrated multiple esophageal varices and slow antegrade flow through the portal venous system. Measuring pigtail catheter advanced for planning portal venogram. This was exchanged for a a 7 mm Mustang angioplasty catheter used to dilate the parenchymal tract. This allowed advancement of the 10 French sheath into the portal vein. The 10 mm x 8 cm ViaTorr stent was advanced and deployed from right portal vein to right hepatic vein. The stent was dilated with a 10 mm angioplasty balloon. Follow-up pigtail catheter portal venogram demonstrates brisk antegrade flow through the TIPS, much less filling of a variceal channels, and minimal antegrade flow into intrahepatic portal vein branches. There is a nonocclusive mural thrombus near the portosplenic confluence. Pressure measurements reveal a 6 mm Hg  portosystemic gradient. The pigtail catheter was withdrawn. The sheath was exchanged for a 25 cm 10 French sheath left with the tip at the SVC/RA junction as venous access. The sheath was secured externally with 0 Prolene suture and placed to a saline flush. The patient tolerated the procedure well. He was transferred to CT for previously planned imaging. IMPRESSION: 1. 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 Outpatient follow-up with a TIPS ultrasound in 1 month. Electronically Signed   By: Corlis Leak  Hassell M.D.   On: 03/24/2016 16:37   Dg Chest Port 1 View  Result Date: 03/24/2016 CLINICAL DATA:  Postop, line placement EXAM: PORTABLE CHEST 1 VIEW COMPARISON:  03/24/2016 FINDINGS: Endotracheal tube is 5 cm above the carina. NG tube enters the stomach. Right internal jugular vascular sheath in place with the tip in the upper right atrium. No pneumothorax. Heart is normal size. No confluent airspace opacities or effusions. No acute bony abnormality. Old left rib fractures again noted, unchanged. IMPRESSION: No active disease. Electronically Signed   By: Charlett NoseKevin  Dover M.D.   On: 03/24/2016 16:48    Labs:  CBC:  Recent Labs  02/05/16 1010 03/24/16 0816 03/25/16 0440 03/26/16 0952  WBC 6.3 11.5* 9.7 10.0  HGB 12.3* 17.6* 13.2 13.0  HCT 38.0* 47.8 38.7* 38.9*  PLT 152 143* 67* 47*    COAGS:  Recent Labs  09/16/15 1719  02/05/16 1010 03/24/16 0816 03/25/16 0440 03/26/16 0952  INR 1.36  < > 1.19 1.47 1.88 2.09  APTT 37  --  35 38* 49*  --   < > = values in this interval not displayed.  BMP:  Recent Labs  02/05/16 1010 03/24/16 0816 03/25/16 0440 03/26/16 0400  NA 129* 125* 129* 131*  K 4.1 3.3* 2.9* 3.6  CL 99* 83* 94* 103  CO2 27 26 28 23   GLUCOSE 91 152* 132* 158*  BUN <5* 9 6 <5*  CALCIUM 8.3*  8.9 7.4* 6.8*  CREATININE 0.53* 1.34* 0.60* 0.46*  GFRNONAA >60 58* >60 >60  GFRAA >60 >60 >60 >60    LIVER FUNCTION TESTS:  Recent Labs  02/05/16 1010 03/24/16 0816 03/25/16 0440 03/26/16 0952  BILITOT 0.6 2.0* 2.8* 2.7*  AST 24 107* 114* 148*  ALT 11* 47 33 54  ALKPHOS 89 200* 114 116  PROT 5.5* 7.8 5.4* 5.0*  ALBUMIN 2.1* 3.0* 2.7* 2.3*    Assessment and Plan: Hx cirrhosis/refractory ascites, incarcerated umbilical hernia; s/p TIPS 10/27, repair of umbilical hernia 10/28, extubated 10/28; AF; BP still a little soft; sodium 131, creatinine 0.46, potassium 3.6, WBC 10.0, hemoglobin 13, platelets 47k, total bilirubin 2.7, ammonia 99, PT 23.8, INR 2.09; continue lactulose; consider rifaximin. Continue to monitor labs. Okay from IR standpoint to remove IJ catheter; additional plans as per IM/CCS/CCM;  patient will need follow-up TIPS ultrasound and IR clinic visit in 4 weeks with Dr. Deanne Coffer.   Electronically Signed: D. Jeananne Rama 03/26/2016, 1:23 PM   I spent a total of 15 minutes at the the patient's bedside AND on the patient's hospital floor or unit, greater than 50% of which was counseling/coordinating care for transjugular intrahepatic portosystemic shunt    Patient ID: [name], male   DOB: 30-Sep-1960, 55 y.o.   MRN: [id number]

## 2016-03-26 NOTE — Anesthesia Postprocedure Evaluation (Signed)
Anesthesia Post Note  Patient: Bryce MawRobert [name]  Procedure(s) Performed: Procedure(s) (LRB): HERNIA REPAIR UMBILICAL INCARCERATED (N/A)  Patient location during evaluation: SICU Anesthesia Type: General Level of consciousness: sedated Pain management: pain level controlled Vital Signs Assessment: post-procedure vital signs reviewed and stable Respiratory status: patient remains intubated per anesthesia plan Cardiovascular status: stable Anesthetic complications: no    Last Vitals:  Vitals:   03/26/16 0600 03/26/16 0700  BP: 94/74 (!) 89/71  Pulse: 71 66  Resp: 18 18  Temp:      Last Pain:  Vitals:   03/26/16 0402  TempSrc: Oral  PainSc:                  Reino KentJudd, Delano Frate J

## 2016-03-26 NOTE — Progress Notes (Signed)
1 Day Post-Op  Subjective: SEEMS OK WANTS TO GO HOME   Objective: Vital signs in last 24 hours: Temp:  [97.4 F (36.3 C)-98.3 F (36.8 C)] 97.7 F (36.5 C) (10/29 1236) Pulse Rate:  [53-92] 73 (10/29 1000) Resp:  [14-21] 19 (10/29 1000) BP: (76-150)/(59-88) 79/65 (10/29 1000) SpO2:  [93 %-100 %] 98 % (10/29 1000) Weight:  [66.4 kg (146 lb 6.2 oz)] 66.4 kg (146 lb 6.2 oz) (10/29 0600) Last BM Date:  (PTA)  Intake/Output from previous day: 10/28 0701 - 10/29 0700 In: 3540 [P.O.:1080; I.V.:2010; IV Piggyback:450] Out: 2875 [Urine:2875] Intake/Output this shift: Total I/O In: 255 [I.V.:255] Out: -   Incision/Wound:DRESSING DRY SOFT NT ABDOMEN    Lab Results:   Recent Labs  03/25/16 0440 03/26/16 0952  WBC 9.7 10.0  HGB 13.2 13.0  HCT 38.7* 38.9*  PLT 67* 47*   BMET  Recent Labs  03/25/16 0440 03/26/16 0400  NA 129* 131*  K 2.9* 3.6  CL 94* 103  CO2 28 23  GLUCOSE 132* 158*  BUN 6 <5*  CREATININE 0.60* 0.46*  CALCIUM 7.4* 6.8*   PT/INR  Recent Labs  03/25/16 0440 03/26/16 0952  LABPROT 21.8* 23.8*  INR 1.88 2.09   ABG  Recent Labs  03/24/16 1730  PHART 7.466*  HCO3 29.5*    Studies/Results: Ct Abdomen Pelvis Wo Contrast  Addendum Date: 03/24/2016   ADDENDUM REPORT: 03/24/2016 16:37 ADDENDUM: These results were called by telephone at the time of interpretation on 03/24/2016 at 4:20 pm to Dr. Claud Kelp, who verbally acknowledged these results. Electronically Signed   By: Carey Bullocks M.D.   On: 03/24/2016 16:37   Result Date: 03/24/2016 CLINICAL DATA:  Incarcerated umbilical hernia. Left lung nodule. Post TIPS procedure today. EXAM: CT CHEST, ABDOMEN AND PELVIS WITHOUT CONTRAST TECHNIQUE: Multidetector CT imaging of the chest, abdomen and pelvis was performed following the standard protocol without IV contrast. COMPARISON:  Chest radiographs 03/24/2016. FINDINGS: CT CHEST FINDINGS Cardiovascular: Atherosclerosis of the aorta, great  vessels and coronary arteries. Right IJ central venous catheter extends into the superior aspect of the right atrium. The heart size is normal. There is no pericardial effusion. Mediastinum/Nodes: There are no enlarged mediastinal, hilar or axillary lymph nodes. Endotracheal and nasogastric tubes are in place. There is possible wall thickening of the distal esophagus. The thyroid gland and trachea demonstrate no significant findings. Lungs/Pleura: There is no pleural effusion. Mild emphysema. There is a small calcified granuloma in the lingula on image 70 which may account for the radiographic finding. No suspicious pulmonary nodules. There is no confluent airspace opacity or pneumothorax. Musculoskeletal/Chest wall: Bilateral gynecomastia noted. Old rib fractures are present bilaterally. Fractures of the left eighth and ninth ribs may be incompletely healed. There is a fracture of the T12 vertebral body with associated mild osseous retropulsion. This is not clearly seen on chest radiographs from April and could be subacute in age. CT ABDOMEN AND PELVIS FINDINGS Hepatobiliary: Diffuse hepatic steatosis. No focal hepatic abnormalities are seen on noncontrast imaging. There is high-density material layering within the gallbladder lumen. This is likely due to a combination of gallstones and vicarious excretion of contrast. No gallbladder wall thickening or significant biliary dilatation seen. Pancreas: Unremarkable. No pancreatic ductal dilatation or surrounding inflammatory changes. Spleen: Normal in size without focal abnormality. Adrenals/Urinary Tract: Both adrenal glands appear normal. There is contrast material within the renal collecting systems bilaterally, attributed to previous interventional procedure. Low-density renal lesions are present bilaterally, likely cysts. One  in the lower pole of the left kidney measures 3.1 cm and 21 HU. No evidence of hydronephrosis. Bladder is partially decompressed by a Foley  catheter. There is bladder wall thickening. Stomach/Bowel: The stomach is decompressed by a nasogastric tube. The proximal small bowel is fluid filled with scattered air-fluid levels and mild dilatation. There is an umbilical hernia containing small bowel, measuring up to 7.0 x 8.9 cm transverse. The distal small bowel is decompressed. No colonic abnormalities are seen. There is a small periampullary duodenal diverticulum. Vascular/Lymphatic: There are no enlarged abdominal or pelvic lymph nodes. Diffuse atherosclerosis of the aorta and its branches. TIPS extends from the portal vein to the junction of the IVC with the right hepatic vein. Patency not addressed by this examination. No evidence of surrounding hematoma. Probable upper abdominal collateral vessels/varices. Reproductive: Mild enlargement of the prostate gland which demonstrates dystrophic calcifications. The seminal vesicles appear unremarkable. Other: As above, umbilical hernia containing small bowel. There is a moderate amount of ascites, with most of the fluid in the pelvis. Musculoskeletal: No acute or significant osseous findings. IMPRESSION: 1. No suspicious pulmonary nodule. There is a small calcified granuloma in the lingula. 2. Umbilical hernia containing fluid-filled small bowel. There is proximal small bowel dilatation with air-fluid levels, supporting the presence of an incarcerated hernia. 3. Moderate ascites, attributed to underlying liver disease. 4. T12 superior endplate compression fracture with osseous retropulsion, potentially subacute. 5. Indeterminate 3.1 cm left renal lesion, measuring higher than water density. Statistically, this is a complex cyst. 6. Additional incidental findings including emphysema, bilateral rib fractures, hepatic steatosis, diffuse atherosclerosis and bladder wall thickening. Electronically Signed: By: Carey BullocksWilliam  Veazey M.D. On: 03/24/2016 16:07   Ct Chest Wo Contrast  Addendum Date: 03/24/2016   ADDENDUM  REPORT: 03/24/2016 16:37 ADDENDUM: These results were called by telephone at the time of interpretation on 03/24/2016 at 4:20 pm to Dr. Claud KelpHaywood Ingram, who verbally acknowledged these results. Electronically Signed   By: Carey BullocksWilliam  Veazey M.D.   On: 03/24/2016 16:37   Result Date: 03/24/2016 CLINICAL DATA:  Incarcerated umbilical hernia. Left lung nodule. Post TIPS procedure today. EXAM: CT CHEST, ABDOMEN AND PELVIS WITHOUT CONTRAST TECHNIQUE: Multidetector CT imaging of the chest, abdomen and pelvis was performed following the standard protocol without IV contrast. COMPARISON:  Chest radiographs 03/24/2016. FINDINGS: CT CHEST FINDINGS Cardiovascular: Atherosclerosis of the aorta, great vessels and coronary arteries. Right IJ central venous catheter extends into the superior aspect of the right atrium. The heart size is normal. There is no pericardial effusion. Mediastinum/Nodes: There are no enlarged mediastinal, hilar or axillary lymph nodes. Endotracheal and nasogastric tubes are in place. There is possible wall thickening of the distal esophagus. The thyroid gland and trachea demonstrate no significant findings. Lungs/Pleura: There is no pleural effusion. Mild emphysema. There is a small calcified granuloma in the lingula on image 70 which may account for the radiographic finding. No suspicious pulmonary nodules. There is no confluent airspace opacity or pneumothorax. Musculoskeletal/Chest wall: Bilateral gynecomastia noted. Old rib fractures are present bilaterally. Fractures of the left eighth and ninth ribs may be incompletely healed. There is a fracture of the T12 vertebral body with associated mild osseous retropulsion. This is not clearly seen on chest radiographs from April and could be subacute in age. CT ABDOMEN AND PELVIS FINDINGS Hepatobiliary: Diffuse hepatic steatosis. No focal hepatic abnormalities are seen on noncontrast imaging. There is high-density material layering within the gallbladder  lumen. This is likely due to a combination of gallstones and vicarious  excretion of contrast. No gallbladder wall thickening or significant biliary dilatation seen. Pancreas: Unremarkable. No pancreatic ductal dilatation or surrounding inflammatory changes. Spleen: Normal in size without focal abnormality. Adrenals/Urinary Tract: Both adrenal glands appear normal. There is contrast material within the renal collecting systems bilaterally, attributed to previous interventional procedure. Low-density renal lesions are present bilaterally, likely cysts. One in the lower pole of the left kidney measures 3.1 cm and 21 HU. No evidence of hydronephrosis. Bladder is partially decompressed by a Foley catheter. There is bladder wall thickening. Stomach/Bowel: The stomach is decompressed by a nasogastric tube. The proximal small bowel is fluid filled with scattered air-fluid levels and mild dilatation. There is an umbilical hernia containing small bowel, measuring up to 7.0 x 8.9 cm transverse. The distal small bowel is decompressed. No colonic abnormalities are seen. There is a small periampullary duodenal diverticulum. Vascular/Lymphatic: There are no enlarged abdominal or pelvic lymph nodes. Diffuse atherosclerosis of the aorta and its branches. TIPS extends from the portal vein to the junction of the IVC with the right hepatic vein. Patency not addressed by this examination. No evidence of surrounding hematoma. Probable upper abdominal collateral vessels/varices. Reproductive: Mild enlargement of the prostate gland which demonstrates dystrophic calcifications. The seminal vesicles appear unremarkable. Other: As above, umbilical hernia containing small bowel. There is a moderate amount of ascites, with most of the fluid in the pelvis. Musculoskeletal: No acute or significant osseous findings. IMPRESSION: 1. No suspicious pulmonary nodule. There is a small calcified granuloma in the lingula. 2. Umbilical hernia containing  fluid-filled small bowel. There is proximal small bowel dilatation with air-fluid levels, supporting the presence of an incarcerated hernia. 3. Moderate ascites, attributed to underlying liver disease. 4. T12 superior endplate compression fracture with osseous retropulsion, potentially subacute. 5. Indeterminate 3.1 cm left renal lesion, measuring higher than water density. Statistically, this is a complex cyst. 6. Additional incidental findings including emphysema, bilateral rib fractures, hepatic steatosis, diffuse atherosclerosis and bladder wall thickening. Electronically Signed: By: Carey Bullocks M.D. On: 03/24/2016 16:07   US Renal  Result Date: 03/25/2016 CLINICAL DATA:  Urinary retention.  Cirrhosis of liver. EXAM: RENAL / URINARY TRACT ULTRASOUND COMPLETE COMPARISON:  Ultrasound 03/22/2016. FINDINGS: Right Kidney: Length: 11.5 cm. Echogenicity within normal limits. No mass or hydronephrosis visualized. Left Kidney: Length: 13.0 cm. 3 cm left lower pole renal cyst. Echogenicity within normal limits. No mass or hydronephrosis visualized. Bladder: The urinary bladder empty.  Foley catheter in the bladder Mild ascites. IMPRESSION: No renal obstruction.  Urinary bladder empty with Foley catheter Mild ascites, significantly improved from ultrasound of 03/22/2016 Electronically Signed   By: Marlan Palau M.D.   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 Recurrent large volume ascites requiring nearly weekly paracentesis. See previous consultation. EXAM: TRANSJUGULAR INTRAHEPATIC PORTOSYSTEMIC SHUNT MEDICATIONS: As antibiotic prophylaxis, cefazolin 2 g was ordered pre-procedure and administered intravenously within one hour of incision. ANESTHESIA/SEDATION: General - as administered by the Anesthesia department CONTRAST:  80 mL Isovue FLUOROSCOPY TIME:  7 minutes 18 seconds, 895 mGy COMPLICATIONS: None immediate. PROCEDURE: Informed  written consent was obtained from the patient after a thorough discussion of the procedural risks, benefits and alternatives. All questions were addressed. Maximal Sterile Barrier Technique was utilized including caps, mask, sterile gowns, sterile gloves, sterile drape, hand hygiene and skin antiseptic. A timeout was performed prior to the initiation of the procedure. Under ultrasound guidance, a 21 gauge micropuncture needle was advanced  percutaneously into a right portal vein branch. A 018 guidewire was advanced into the main portal vein. 3French micro dilator was used for contrast injection to confirm portal venous positioning. A 5 French micropuncture dilator was placed with Touhy attachment. Confirmatory portal venography performed. Under ultrasound guidance, the right IJ vein was localized. The vessel was accessed with a 21-gauge micropuncture needle, exchanged over a 018 guidewire for a transitional dilator, through which a 035 guidewire was advanced. After tract dilatation, a 10 French curved vascular sheath was placed, advanced into the IVC. Through this, a 5 French angled angiographic catheter was advanced and used to catheterize the right hepatic vein. Confirmatory right hepatic venography performed. Portal and hepatic venous pressure measurements were obtained, demonstrating mean portosystemic gradient. The 018 guidewire was positioned with its transition point at the confluence of anterior and posterior right portal vein branches as a target. The [place] needle and sheath were advanced through the primary sheath, and the needle advanced into the right portal vein branch under biplane fluoroscopy guidance. After confirmatory contrast injection, an angled stiff glidewire was advanced into the splenic vein. The needle was exchanged for the 5 French angiographic catheter, advanced into the splenic vein for confirmatory portal venography. This demonstrated multiple esophageal varices and slow  antegrade flow through the portal venous system. Measuring pigtail catheter advanced for planning portal venogram. This was exchanged for a a 7 mm Mustang angioplasty catheter used to dilate the parenchymal tract. This allowed advancement of the 10 French sheath into the portal vein. The 10 mm x 8 cm ViaTorr stent was advanced and deployed from right portal vein to right hepatic vein. The stent was dilated with a 10 mm angioplasty balloon. Follow-up pigtail catheter portal venogram demonstrates brisk antegrade flow through the TIPS, much less filling of a variceal channels, and minimal antegrade flow into intrahepatic portal vein branches. There is a nonocclusive mural thrombus near the portosplenic confluence. Pressure measurements reveal a 6 mm Hg portosystemic gradient. The pigtail catheter was withdrawn. The sheath was exchanged for a 25 cm 10 French sheath left with the tip at the SVC/RA junction as venous access. The sheath was secured externally with 0 Prolene suture and placed to a saline flush. The patient tolerated the procedure well. He was transferred to CT for previously planned imaging. IMPRESSION: 1. 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 Outpatient follow-up with a TIPS ultrasound in 1 month. Electronically Signed   By: Corlis Leak M.D.   On: 03/24/2016 16:37   Dg Chest Port 1 View  Result Date: 03/24/2016 CLINICAL DATA:  Postop, line placement EXAM: PORTABLE CHEST 1 VIEW COMPARISON:  03/24/2016 FINDINGS: Endotracheal tube is 5 cm above the carina. NG tube enters the stomach. Right internal jugular vascular sheath in place with the tip in the upper right atrium. No pneumothorax. Heart is normal size. No confluent airspace opacities or effusions. No acute bony abnormality. Old left rib fractures again noted,  unchanged. IMPRESSION: No active disease. Electronically Signed   By: Charlett Nose M.D.   On: 03/24/2016 16:48    Anti-infectives: Anti-infectives    Start     Dose/Rate Route Frequency Ordered Stop   03/25/16 0800  ceFAZolin (ANCEF) IVPB 2g/100 mL premix  Status:  Discontinued     2 g 200 mL/hr over 30 Minutes Intravenous To [facility] 03/24/16 2033 03/25/16 0404  03/25/16 0600  vancomycin (VANCOCIN) 500 mg in sodium chloride 0.9 % 100 mL IVPB     500 mg 100 mL/hr over 60 Minutes Intravenous Every 12 hours 03/24/16 1659     03/24/16 1800  piperacillin-tazobactam (ZOSYN) IVPB 3.375 g     3.375 g 12.5 mL/hr over 240 Minutes Intravenous Every 8 hours 03/24/16 1655     03/24/16 1730  vancomycin (VANCOCIN) 1,250 mg in sodium chloride 0.9 % 250 mL IVPB     1,250 mg 166.7 mL/hr over 90 Minutes Intravenous To Post Anesthesia Care Unit 03/24/16 1655 03/25/16 1730   03/24/16 0815  ceFAZolin (ANCEF) IVPB 2g/100 mL premix     2 g 200 mL/hr over 30 Minutes Intravenous To Short Stay 03/24/16 0757 03/24/16 1240   03/24/16 0759  ceFAZolin (ANCEF) 2-4 GM/100ML-% IVPB    Comments:  Marrianne MoodAltman, [name]   : cabinet override      03/24/16 0759 03/24/16 1225      Assessment/Plan: s/p Procedure(s): HERNIA REPAIR UMBILICAL INCARCERATED (N/A) HOME ANYTIME FROM SURGERY STANDPOINT   LOS: 2 days    Josalyn Dettmann A. 03/26/2016

## 2016-03-27 ENCOUNTER — Encounter (HOSPITAL_COMMUNITY): Payer: Self-pay | Admitting: Interventional Radiology

## 2016-03-27 DIAGNOSIS — G9340 Encephalopathy, unspecified: Secondary | ICD-10-CM

## 2016-03-27 DIAGNOSIS — E871 Hypo-osmolality and hyponatremia: Secondary | ICD-10-CM

## 2016-03-27 DIAGNOSIS — Z720 Tobacco use: Secondary | ICD-10-CM

## 2016-03-27 LAB — COMPREHENSIVE METABOLIC PANEL
ALT: 48 U/L (ref 17–63)
ANION GAP: 3 — AB (ref 5–15)
AST: 106 U/L — AB (ref 15–41)
Albumin: 2.2 g/dL — ABNORMAL LOW (ref 3.5–5.0)
Alkaline Phosphatase: 109 U/L (ref 38–126)
BILIRUBIN TOTAL: 1.9 mg/dL — AB (ref 0.3–1.2)
CO2: 28 mmol/L (ref 22–32)
Calcium: 7.4 mg/dL — ABNORMAL LOW (ref 8.9–10.3)
Chloride: 101 mmol/L (ref 101–111)
Creatinine, Ser: 0.61 mg/dL (ref 0.61–1.24)
GFR calc Af Amer: >60 mL/min (ref >60)
Glucose, Bld: 137 mg/dL — ABNORMAL HIGH (ref 65–99)
POTASSIUM: 4.2 mmol/L (ref 3.5–5.1)
Sodium: 132 mmol/L — ABNORMAL LOW (ref 135–145)
TOTAL PROTEIN: 4.9 g/dL — AB (ref 6.5–8.1)

## 2016-03-27 LAB — GLUCOSE, CAPILLARY
GLUCOSE-CAPILLARY: 122 mg/dL — AB (ref 65–99)
GLUCOSE-CAPILLARY: 111 mg/dL — AB (ref 65–99)
GLUCOSE-CAPILLARY: 110 mg/dL — AB (ref 65–99)
Glucose-Capillary: 123 mg/dL — ABNORMAL HIGH (ref 65–99)

## 2016-03-27 LAB — CBC
HCT: 39.8 % (ref 39.0–52.0)
Hemoglobin: 13.1 g/dL (ref 13.0–17.0)
MCH: 31.5 pg (ref 26.0–34.0)
MCHC: 32.9 g/dL (ref 30.0–36.0)
MCV: 95.7 fL (ref 78.0–100.0)
PLATELETS: 56 10*3/uL — AB (ref 150–400)
RBC: 4.16 MIL/uL — ABNORMAL LOW (ref 4.22–5.81)
RDW: 14.6 % (ref 11.5–15.5)
WBC: 12.8 10*3/uL — ABNORMAL HIGH (ref 4.0–10.5)

## 2016-03-27 LAB — SURGICAL PCR SCREEN
MRSA, PCR: NEGATIVE
STAPHYLOCOCCUS AUREUS: NEGATIVE

## 2016-03-27 LAB — PROTIME-INR
INR: 1.74
PROTHROMBIN TIME: 20.5 s — AB (ref 11.4–15.2)

## 2016-03-27 LAB — TRIGLYCERIDES: Triglycerides: 40 mg/dL (ref <150)

## 2016-03-27 MED ORDER — FOLIC ACID 1 MG PO TABS
1.0000 mg | ORAL_TABLET | Freq: Every day | ORAL | Status: DC
  Administered 2016-03-27 – 2016-04-01 (×6): 1 mg via ORAL
  Filled 2016-03-27 (×6): qty 1

## 2016-03-27 MED ORDER — VITAMIN B-1 100 MG PO TABS
100.0000 mg | ORAL_TABLET | Freq: Every day | ORAL | Status: DC
  Administered 2016-03-27 – 2016-04-01 (×6): 100 mg via ORAL
  Filled 2016-03-27 (×6): qty 1

## 2016-03-27 MED ORDER — LACTULOSE 10 GM/15ML PO SOLN
10.0000 g | Freq: Three times a day (TID) | ORAL | Status: DC
  Administered 2016-03-27 – 2016-03-29 (×6): 10 g via ORAL
  Filled 2016-03-27 (×6): qty 15

## 2016-03-27 MED ORDER — HEPARIN SODIUM (PORCINE) 5000 UNIT/ML IJ SOLN
5000.0000 [IU] | Freq: Three times a day (TID) | INTRAMUSCULAR | Status: DC
  Administered 2016-03-27 – 2016-03-29 (×6): 5000 [IU] via SUBCUTANEOUS
  Filled 2016-03-27 (×6): qty 1

## 2016-03-27 MED ORDER — PHYTONADIONE 5 MG PO TABS
5.0000 mg | ORAL_TABLET | Freq: Once | ORAL | Status: AC
  Administered 2016-03-27: 5 mg via ORAL
  Filled 2016-03-27: qty 1

## 2016-03-27 MED ORDER — LORAZEPAM 1 MG PO TABS
1.0000 mg | ORAL_TABLET | Freq: Three times a day (TID) | ORAL | Status: DC
  Administered 2016-03-28 – 2016-03-29 (×4): 1 mg via ORAL
  Filled 2016-03-27 (×4): qty 1

## 2016-03-27 NOTE — Progress Notes (Signed)
[facility] Progress Note  2 Days Post-Op  Subjective: Very flat affect. Denies abdominal pain. Mild nausea and decreased appetite, no vomiting. Had a BM this AM. Eager to go home.  Objective: Vital signs in last 24 hours: Temp:  [97.4 F (36.3 C)-98.3 F (36.8 C)] 97.4 F (36.3 C) (10/30 0805) Pulse Rate:  [50-86] 69 (10/30 0800) Resp:  [17-23] 19 (10/30 0800) BP: (79-123)/(61-84) 93/67 (10/30 0800) SpO2:  [91 %-98 %] 94 % (10/30 0800) Weight:  [153 lb 3.5 oz (69.5 kg)] 153 lb 3.5 oz (69.5 kg) (10/30 0500) Last BM Date: 03/27/16  Intake/Output from previous day: 10/29 0701 - 10/30 0700 In: 2180 [I.V.:1830; IV Piggyback:350] Out: 1550 [Urine:1550] Intake/Output this shift: Total I/O In: 75 [I.V.:75] Out: 65 [Urine:65]  PE: Gen:  Alert, NAD, flat affect Pulm:  CTA, diminished breath sounds at bases no W/R/R Abd: Soft, NT/ND, +BS, umbilicus covered - dressing c/d/i   Lab Results:   Recent Labs  03/26/16 0952 03/27/16 0345  WBC 10.0 12.8*  HGB 13.0 13.1  HCT 38.9* 39.8  PLT 47* 56*   BMET  Recent Labs  03/26/16 0400 03/27/16 0345  NA 131* 132*  K 3.6 4.2  CL 103 101  CO2 23 28  GLUCOSE 158* 137*  BUN <5* <5*  CREATININE 0.46* 0.61  CALCIUM 6.8* 7.4*   PT/INR  Recent Labs  03/26/16 0952 03/27/16 0345  LABPROT 23.8* 20.5*  INR 2.09 1.74   CMP     Component Value Date/Time   NA 132 (L) 03/27/2016 0345   K 4.2 03/27/2016 0345   CL 101 03/27/2016 0345   CO2 28 03/27/2016 0345   GLUCOSE 137 (H) 03/27/2016 0345   BUN <5 (L) 03/27/2016 0345   CREATININE 0.61 03/27/2016 0345   CREATININE 0.90 10/08/2015 1306   CALCIUM 7.4 (L) 03/27/2016 0345   PROT 4.9 (L) 03/27/2016 0345   ALBUMIN 2.2 (L) 03/27/2016 0345   AST 106 (H) 03/27/2016 0345   ALT 48 03/27/2016 0345   ALKPHOS 109 03/27/2016 0345   BILITOT 1.9 (H) 03/27/2016 0345   GFRNONAA >60 03/27/2016 0345   GFRNONAA >89 10/08/2015 1306   GFRAA >60 03/27/2016 0345   GFRAA >89  10/08/2015 1306   Lipase     Component Value Date/Time   LIPASE 44 06/07/2015 1919       Studies/Results: No results found.  Anti-infectives: Anti-infectives    Start     Dose/Rate Route Frequency Ordered Stop   03/25/16 0800  ceFAZolin (ANCEF) IVPB 2g/100 mL premix  Status:  Discontinued     2 g 200 mL/hr over 30 Minutes Intravenous To ShortStay Surgical 03/24/16 2033 03/25/16 0404   03/25/16 0600  vancomycin (VANCOCIN) 500 mg in sodium chloride 0.9 % 100 mL IVPB     500 mg 100 mL/hr over 60 Minutes Intravenous Every 12 hours 03/24/16 1659     03/24/16 1800  piperacillin-tazobactam (ZOSYN) IVPB 3.375 g     3.375 g 12.5 mL/hr over 240 Minutes Intravenous Every 8 hours 03/24/16 1655     03/24/16 1730  vancomycin (VANCOCIN) 1,250 mg in sodium chloride 0.9 % 250 mL IVPB     1,250 mg 166.7 mL/hr over 90 Minutes Intravenous To Post Anesthesia Care Unit 03/24/16 1655 03/25/16 1730   03/24/16 0815  ceFAZolin (ANCEF) IVPB 2g/100 mL premix     2 g 200 mL/hr over 30 Minutes Intravenous To Short Stay 03/24/16 0757 03/24/16 1240   03/24/16 0759  ceFAZolin (ANCEF) 2-4  GM/100ML-% IVPB    Comments:  [name], [name]   : cabinet override      03/24/16 0759 03/24/16 1225     Assessment/Plan Incarcerated umbilical hernia S/p Umbilical hernia repair 10/28, Dr. 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  FEN: soft  ID: Ancef one dose 10/27, Zosyn 10/28 >>, Vancomycin 10/28 >> VTE: [place] heparin, SCD's  Plan: d/c foley, CBC in AM  Continue zofran PRN for nausea, continue soft diet Stable for transfer out of unit from a surgical standpoint    LOS: 3 days    Adam PhenixElizabeth S Simaan , [facility] 03/27/2016, 9:59 AM Pager: [phone] Consults:  [phone] Mon-Fri 7:00 am-4:30 pm Sat-Sun 7:00 am-11:30 am

## 2016-03-27 NOTE — Progress Notes (Signed)
Pharmacy Antibiotic Note  Bryce MawRobert [name] is a 55 y.o. male admitted on 03/24/2016 s/p TIPS procedure for alcoholic cirrhosis.  He continues on day #4 zosyn (vancomycin d/c'ed today) for intra-abdominal infection/sepsis related to an incarcerated umbilical hernia s/p repair 10/28. AKI resolved, sCr stable.  Plan: 1) Continue zosyn 3.375 g IV q8h (4 hour infusion) 2) Follow up LOT  Height: 6\' 1"  (185.4 cm) Weight: 153 lb 3.5 oz (69.5 kg) IBW/kg (Calculated) : 79.9  Temp (24hrs), Avg:97.9 F (36.6 C), Min:97.4 F (36.3 C), Max:98.3 F (36.8 C)   Recent Labs Lab 03/24/16 0816 03/25/16 0440 03/25/16 0900 03/26/16 0400 03/26/16 0952 03/27/16 0345  WBC 11.5* 9.7  --   --  10.0 12.8*  CREATININE 1.34* 0.60*  --  0.46*  --  0.61  LATICACIDVEN  --   --  2.1*  --   --   --     Estimated Creatinine Clearance: 102.6 mL/min (by C-G formula based on SCr of 0.61 mg/dL).    No Known Allergies  Antimicrobials this admission: Vancomycin 10/27 >> 10/30 Zosyn 10/27 >>   Dose adjustments this admission: N/a  Microbiology results: 10/27 blood x2 >> ngtd  Thank you for allowing pharmacy to be a part of this patient's care.  Loura BackJennifer Kasson, PharmD, BCPS Clinical Pharmacist Phone for tonight [phone] Main pharmacy - [phone] 03/27/2016 2:03 PM

## 2016-03-27 NOTE — Progress Notes (Signed)
Patient sustained a fall at 1605. Patient was observed getting up out of chair by Michail JewelsJanice Coble RN. Patient was seen falling onto buttocks and was assessed. Patient does not appear to have any injuries. A chair alarm was placed under patient and the need for the device was explained. Patient stated he understood and was educated again about using the call bell when needing anything or wanting to ambulate. Will continue to monitor patient closely.  Ardath SaxGerald S. Ladona Ridgelaylor RN

## 2016-03-27 NOTE — Progress Notes (Signed)
This note also relates to the following rows which could not be included: Pulse Rate - Cannot attach notes to unvalidated device data ECG Heart Rate - Cannot attach notes to unvalidated device data Resp - Cannot attach notes to unvalidated device data SpO2 - Cannot attach notes to unvalidated device data   

## 2016-03-27 NOTE — Progress Notes (Signed)
Referring Physician(s): Dr [name]  Supervising Physician: Richarda Overlie  Patient Status:  [name] - In-pt  Chief Complaint:  TIPs procedure 10/27 Refractory ascites; alcoholic cirrhosis  Subjective:  Post TIPS 10/27 Up in chair Doing well Eating some No complaints Less confused today  Pt also underwent incarcerated bowel surgery 10/28   Allergies: Review of patient's allergies indicates no known allergies.  Medications: Prior to Admission medications   Medication Sig Start Date End Date Taking? Authorizing Provider  albuterol (PROVENTIL HFA;VENTOLIN HFA) 108 (90 Base) MCG/ACT inhaler Inhale 2 puffs into the lungs every 6 (six) hours as needed for wheezing or shortness of breath. 12/02/15  Yes [name], MD  citalopram (CELEXA) 20 MG tablet Take 1 tablet (20 mg total) by mouth daily. Patient taking differently: Take 20 mg by mouth every evening.  03/10/16  Yes [name], MD  diclofenac sodium (VOLTAREN) 1 % GEL Apply 4 g topically 4 (four) times daily. Patient taking differently: Apply 4 g topically 4 (four) times daily as needed (for pain).  10/21/15  Yes [name], MD  furosemide (LASIX) 20 MG tablet TAKE 1 TABLET BY MOUTH DAILY. Patient taking differently: TAKE 1 TABLET BY MOUTH DAILY IN THE AFTERNOON 03/02/16  Yes [name], MD  lactulose (CHRONULAC) 10 GM/15ML solution Take 15 mLs (10 g total) by mouth 2 (two) times daily. 07/22/15  Yes [name], MD  potassium chloride SA (K-DUR,KLOR-CON) 20 MEQ tablet Take 1 tablet (20 mEq total) by mouth daily. Patient taking differently: Take 20 mEq by mouth every evening.  03/10/16  Yes [name], MD  ranitidine (ZANTAC) 150 MG tablet Take 1 tablet (150 mg total) by mouth 2 (two) times daily. 11/22/15  Yes [name], MD  spironolactone (ALDACTONE) 50 MG tablet Take 1 tablet (50 mg total) by mouth daily. 03/10/16  Yes [name], MD     Vital Signs: BP 93/67 (BP Location: Right Arm)   Pulse 69   Temp 97.4  F (36.3 C) (Oral)   Resp 19   Ht 6\' 1"  (1.854 m)   Wt 153 lb 3.5 oz (69.5 kg)   SpO2 94%   BMI 20.21 kg/m   Physical Exam  Neck:  Rt IJ catheter still intact In use NT no bleeding; no hematoma  Abdominal: Soft. Bowel sounds are normal. There is tenderness.  Musculoskeletal: Normal range of motion.  Neurological: He is alert.  Skin: Skin is warm.  Psychiatric: He has a normal mood and affect.  Nursing note and vitals reviewed.   Imaging: Ct Abdomen Pelvis Wo Contrast  Addendum Date: 03/24/2016   ADDENDUM REPORT: 03/24/2016 16:37 ADDENDUM: These results were called by telephone at the time of interpretation on 03/24/2016 at 4:20 pm to Dr. Claud Kelp, who verbally acknowledged these results. Electronically Signed   By: Carey Bullocks M.D.   On: 03/24/2016 16:37   Result Date: 03/24/2016 CLINICAL DATA:  Incarcerated umbilical hernia. Left lung nodule. Post TIPS procedure today. EXAM: CT CHEST, ABDOMEN AND PELVIS WITHOUT CONTRAST TECHNIQUE: Multidetector CT imaging of the chest, abdomen and pelvis was performed following the standard protocol without IV contrast. COMPARISON:  Chest radiographs 03/24/2016. FINDINGS: CT CHEST FINDINGS Cardiovascular: Atherosclerosis of the aorta, great vessels and coronary arteries. Right IJ central venous catheter extends into the superior aspect of the right atrium. The heart size is normal. There is no pericardial effusion. Mediastinum/Nodes: There are no enlarged mediastinal, hilar or axillary lymph nodes. Endotracheal and nasogastric tubes are in place. There is  possible wall thickening of the distal esophagus. The thyroid gland and trachea demonstrate no significant findings. Lungs/Pleura: There is no pleural effusion. Mild emphysema. There is a small calcified granuloma in the lingula on image 70 which may account for the radiographic finding. No suspicious pulmonary nodules. There is no confluent airspace opacity or pneumothorax.  Musculoskeletal/Chest wall: Bilateral gynecomastia noted. Old rib fractures are present bilaterally. Fractures of the left eighth and ninth ribs may be incompletely healed. There is a fracture of the T12 vertebral body with associated mild osseous retropulsion. This is not clearly seen on chest radiographs from April and could be subacute in age. CT ABDOMEN AND PELVIS FINDINGS Hepatobiliary: Diffuse hepatic steatosis. No focal hepatic abnormalities are seen on noncontrast imaging. There is high-density material layering within the gallbladder lumen. This is likely due to a combination of gallstones and vicarious excretion of contrast. No gallbladder wall thickening or significant biliary dilatation seen. Pancreas: Unremarkable. No pancreatic ductal dilatation or surrounding inflammatory changes. Spleen: Normal in size without focal abnormality. Adrenals/Urinary Tract: Both adrenal glands appear normal. There is contrast material within the renal collecting systems bilaterally, attributed to previous interventional procedure. Low-density renal lesions are present bilaterally, likely cysts. One in the lower pole of the left kidney measures 3.1 cm and 21 HU. No evidence of hydronephrosis. Bladder is partially decompressed by a Foley catheter. There is bladder wall thickening. Stomach/Bowel: The stomach is decompressed by a nasogastric tube. The proximal small bowel is fluid filled with scattered air-fluid levels and mild dilatation. There is an umbilical hernia containing small bowel, measuring up to 7.0 x 8.9 cm transverse. The distal small bowel is decompressed. No colonic abnormalities are seen. There is a small periampullary duodenal diverticulum. Vascular/Lymphatic: There are no enlarged abdominal or pelvic lymph nodes. Diffuse atherosclerosis of the aorta and its branches. TIPS extends from the portal vein to the junction of the IVC with the right hepatic vein. Patency not addressed by this examination. No  evidence of surrounding hematoma. Probable upper abdominal collateral vessels/varices. Reproductive: Mild enlargement of the prostate gland which demonstrates dystrophic calcifications. The seminal vesicles appear unremarkable. Other: As above, umbilical hernia containing small bowel. There is a moderate amount of ascites, with most of the fluid in the pelvis. Musculoskeletal: No acute or significant osseous findings. IMPRESSION: 1. No suspicious pulmonary nodule. There is a small calcified granuloma in the lingula. 2. Umbilical hernia containing fluid-filled small bowel. There is proximal small bowel dilatation with air-fluid levels, supporting the presence of an incarcerated hernia. 3. Moderate ascites, attributed to underlying liver disease. 4. T12 superior endplate compression fracture with osseous retropulsion, potentially subacute. 5. Indeterminate 3.1 cm left renal lesion, measuring higher than water density. Statistically, this is a complex cyst. 6. Additional incidental findings including emphysema, bilateral rib fractures, hepatic steatosis, diffuse atherosclerosis and bladder wall thickening. Electronically Signed: By: Carey BullocksWilliam  Veazey M.D. On: 03/24/2016 16:07   Dg Chest 2 View  Result Date: 03/24/2016 CLINICAL DATA:  Pre op today for TIPS,copd,liver dz,htn,,weakness today EXAM: CHEST  2 VIEW COMPARISON:  09/05/2015 FINDINGS: Lungs are hyperinflated. Heart size is normal. No focal consolidations or pleural effusions are identified. Overlying the left mid lung zone there is a 9 mm nodule. Remote rib fractures. IMPRESSION: 1. 9 mm nodule overlying the left mid lung zone. As needed, further characterization can be performed with CT of the chest. Intravenous contrast is recommended unless it is contraindicated. 2. No acute consolidation. These results will be called to the ordering clinician or representative  by the Radiologist Assistant, and communication documented in the PACS or zVision Dashboard.  Electronically Signed   By: Norva PavlovElizabeth  Brown M.D.   On: 03/24/2016 08:51   Ct Chest Wo Contrast  Addendum Date: 03/24/2016   ADDENDUM REPORT: 03/24/2016 16:37 ADDENDUM: These results were called by telephone at the time of interpretation on 03/24/2016 at 4:20 pm to Dr. Claud KelpHaywood Ingram, who verbally acknowledged these results. Electronically Signed   By: Carey BullocksWilliam  Veazey M.D.   On: 03/24/2016 16:37   Result Date: 03/24/2016 CLINICAL DATA:  Incarcerated umbilical hernia. Left lung nodule. Post TIPS procedure today. EXAM: CT CHEST, ABDOMEN AND PELVIS WITHOUT CONTRAST TECHNIQUE: Multidetector CT imaging of the chest, abdomen and pelvis was performed following the standard protocol without IV contrast. COMPARISON:  Chest radiographs 03/24/2016. FINDINGS: CT CHEST FINDINGS Cardiovascular: Atherosclerosis of the aorta, great vessels and coronary arteries. Right IJ central venous catheter extends into the superior aspect of the right atrium. The heart size is normal. There is no pericardial effusion. Mediastinum/Nodes: There are no enlarged mediastinal, hilar or axillary lymph nodes. Endotracheal and nasogastric tubes are in place. There is possible wall thickening of the distal esophagus. The thyroid gland and trachea demonstrate no significant findings. Lungs/Pleura: There is no pleural effusion. Mild emphysema. There is a small calcified granuloma in the lingula on image 70 which may account for the radiographic finding. No suspicious pulmonary nodules. There is no confluent airspace opacity or pneumothorax. Musculoskeletal/Chest wall: Bilateral gynecomastia noted. Old rib fractures are present bilaterally. Fractures of the left eighth and ninth ribs may be incompletely healed. There is a fracture of the T12 vertebral body with associated mild osseous retropulsion. This is not clearly seen on chest radiographs from April and could be subacute in age. CT ABDOMEN AND PELVIS FINDINGS Hepatobiliary: Diffuse hepatic  steatosis. No focal hepatic abnormalities are seen on noncontrast imaging. There is high-density material layering within the gallbladder lumen. This is likely due to a combination of gallstones and vicarious excretion of contrast. No gallbladder wall thickening or significant biliary dilatation seen. Pancreas: Unremarkable. No pancreatic ductal dilatation or surrounding inflammatory changes. Spleen: Normal in size without focal abnormality. Adrenals/Urinary Tract: Both adrenal glands appear normal. There is contrast material within the renal collecting systems bilaterally, attributed to previous interventional procedure. Low-density renal lesions are present bilaterally, likely cysts. One in the lower pole of the left kidney measures 3.1 cm and 21 HU. No evidence of hydronephrosis. Bladder is partially decompressed by a Foley catheter. There is bladder wall thickening. Stomach/Bowel: The stomach is decompressed by a nasogastric tube. The proximal small bowel is fluid filled with scattered air-fluid levels and mild dilatation. There is an umbilical hernia containing small bowel, measuring up to 7.0 x 8.9 cm transverse. The distal small bowel is decompressed. No colonic abnormalities are seen. There is a small periampullary duodenal diverticulum. Vascular/Lymphatic: There are no enlarged abdominal or pelvic lymph nodes. Diffuse atherosclerosis of the aorta and its branches. TIPS extends from the portal vein to the junction of the IVC with the right hepatic vein. Patency not addressed by this examination. No evidence of surrounding hematoma. Probable upper abdominal collateral vessels/varices. Reproductive: Mild enlargement of the prostate gland which demonstrates dystrophic calcifications. The seminal vesicles appear unremarkable. Other: As above, umbilical hernia containing small bowel. There is a moderate amount of ascites, with most of the fluid in the pelvis. Musculoskeletal: No acute or significant osseous  findings. IMPRESSION: 1. No suspicious pulmonary nodule. There is a small calcified granuloma in the lingula.  2. Umbilical hernia containing fluid-filled small bowel. There is proximal small bowel dilatation with air-fluid levels, supporting the presence of an incarcerated hernia. 3. Moderate ascites, attributed to underlying liver disease. 4. T12 superior endplate compression fracture with osseous retropulsion, potentially subacute. 5. Indeterminate 3.1 cm left renal lesion, measuring higher than water density. Statistically, this is a complex cyst. 6. Additional incidental findings including emphysema, bilateral rib fractures, hepatic steatosis, diffuse atherosclerosis and bladder wall thickening. Electronically Signed: By: Carey Bullocks M.D. On: 03/24/2016 16:07   US Renal  Result Date: 03/25/2016 CLINICAL DATA:  Urinary retention.  Cirrhosis of liver. EXAM: RENAL / URINARY TRACT ULTRASOUND COMPLETE COMPARISON:  Ultrasound 03/22/2016. FINDINGS: Right Kidney: Length: 11.5 cm. Echogenicity within normal limits. No mass or hydronephrosis visualized. Left Kidney: Length: 13.0 cm. 3 cm left lower pole renal cyst. Echogenicity within normal limits. No mass or hydronephrosis visualized. Bladder: The urinary bladder empty.  Foley catheter in the bladder Mild ascites. IMPRESSION: No renal obstruction.  Urinary bladder empty with Foley catheter Mild ascites, significantly improved from ultrasound of 03/22/2016 Electronically Signed   By: Marlan Palau M.D.   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 Recurrent large volume ascites requiring nearly weekly paracentesis. See previous consultation. EXAM: TRANSJUGULAR INTRAHEPATIC PORTOSYSTEMIC SHUNT MEDICATIONS: As antibiotic prophylaxis, cefazolin 2 g was ordered pre-procedure and administered intravenously within one hour of incision. ANESTHESIA/SEDATION: General - as administered by the  Anesthesia department CONTRAST:  80 mL Isovue FLUOROSCOPY TIME:  7 minutes 18 seconds, 895 mGy COMPLICATIONS: None immediate. PROCEDURE: Informed written consent was obtained from the patient after a thorough discussion of the procedural risks, benefits and alternatives. All questions were addressed. Maximal Sterile Barrier Technique was utilized including caps, mask, sterile gowns, sterile gloves, sterile drape, hand hygiene and skin antiseptic. A timeout was performed prior to the initiation of the procedure. Under ultrasound guidance, a 21 gauge micropuncture needle was advanced percutaneously into a right portal vein branch. A 018 guidewire was advanced into the main portal vein. 3French micro dilator was used for contrast injection to confirm portal venous positioning. A 5 French micropuncture dilator was placed with Touhy attachment. Confirmatory portal venography performed. Under ultrasound guidance, the right IJ vein was localized. The vessel was accessed with a 21-gauge micropuncture needle, exchanged over a 018 guidewire for a transitional dilator, through which a 035 guidewire was advanced. After tract dilatation, a 10 French curved vascular sheath was placed, advanced into the IVC. Through this, a 5 French angled angiographic catheter was advanced and used to catheterize the right hepatic vein. Confirmatory right hepatic venography performed. Portal and hepatic venous pressure measurements were obtained, demonstrating mean portosystemic gradient. The 018 guidewire was positioned with its transition point at the confluence of anterior and posterior right portal vein branches as a target. The [place] needle and sheath were advanced through the primary sheath, and the needle advanced into the right portal vein branch under biplane fluoroscopy guidance. After confirmatory contrast injection, an angled stiff glidewire was advanced into the splenic vein. The needle was exchanged for the 5 French  angiographic catheter, advanced into the splenic vein for confirmatory portal venography. This demonstrated multiple esophageal varices and slow antegrade flow through the portal venous system. Measuring pigtail catheter advanced for planning portal venogram. This was exchanged for a a 7 mm Mustang angioplasty catheter used to dilate the parenchymal tract. This allowed advancement of the 10 French sheath into the portal  vein. The 10 mm x 8 cm ViaTorr stent was advanced and deployed from right portal vein to right hepatic vein. The stent was dilated with a 10 mm angioplasty balloon. Follow-up pigtail catheter portal venogram demonstrates brisk antegrade flow through the TIPS, much less filling of a variceal channels, and minimal antegrade flow into intrahepatic portal vein branches. There is a nonocclusive mural thrombus near the portosplenic confluence. Pressure measurements reveal a 6 mm Hg portosystemic gradient. The pigtail catheter was withdrawn. The sheath was exchanged for a 25 cm 10 French sheath left with the tip at the SVC/RA junction as venous access. The sheath was secured externally with 0 Prolene suture and placed to a saline flush. The patient tolerated the procedure well. He was transferred to CT for previously planned imaging. IMPRESSION: 1. 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 Outpatient follow-up with a TIPS ultrasound in 1 month. Electronically Signed   By: Corlis Leak M.D.   On: 03/24/2016 16:37   Dg Chest Port 1 View  Result Date: 03/24/2016 CLINICAL DATA:  Postop, line placement EXAM: PORTABLE CHEST 1 VIEW COMPARISON:  03/24/2016 FINDINGS: Endotracheal tube is 5 cm above the carina. NG tube enters the stomach. Right internal jugular vascular sheath in place with the tip in the upper right atrium. No  pneumothorax. Heart is normal size. No confluent airspace opacities or effusions. No acute bony abnormality. Old left rib fractures again noted, unchanged. IMPRESSION: No active disease. Electronically Signed   By: Charlett Nose M.D.   On: 03/24/2016 16:48    Labs:  CBC:  Recent Labs  03/24/16 0816 03/25/16 0440 03/26/16 0952 03/27/16 0345  WBC 11.5* 9.7 10.0 12.8*  HGB 17.6* 13.2 13.0 13.1  HCT 47.8 38.7* 38.9* 39.8  PLT 143* 67* 47* 56*    COAGS:  Recent Labs  09/16/15 1719  02/05/16 1010 03/24/16 0816 03/25/16 0440 03/26/16 0952 03/27/16 0345  INR 1.36  < > 1.19 1.47 1.88 2.09 1.74  APTT 37  --  35 38* 49*  --   --   < > = values in this interval not displayed.  BMP:  Recent Labs  03/24/16 0816 03/25/16 0440 03/26/16 0400 03/27/16 0345  NA 125* 129* 131* 132*  K 3.3* 2.9* 3.6 4.2  CL 83* 94* 103 101  CO2 26 28 23 28   GLUCOSE 152* 132* 158* 137*  BUN 9 6 <5* <5*  CALCIUM 8.9 7.4* 6.8* 7.4*  CREATININE 1.34* 0.60* 0.46* 0.61  GFRNONAA 58* >60 >60 >60  GFRAA >60 >60 >60 >60    LIVER FUNCTION TESTS:  Recent Labs  03/24/16 0816 03/25/16 0440 03/26/16 0952 03/27/16 0345  BILITOT 2.0* 2.8* 2.7* 1.9*  AST 107* 114* 148* 106*  ALT 47 33 54 48  ALKPHOS 200* 114 116 109  PROT 7.8 5.4* 5.0* 4.9*  ALBUMIN 3.0* 2.7* 2.3* 2.2*    Assessment and Plan:  TIPS 10/27 Incarcerated umbilical hernia- bowel surgery 10/28 plt 56 today---recovering May remove Rt IJ catheter Less confused Will follow  Order in for 4 week follow up with dr Deanne Coffer in [facility]  Electronically Signed: Shan Padgett A 03/27/2016, 11:03 AM   I spent a total of 15 Minutes at the the patient's bedside AND on the patient's hospital floor or unit, greater than 50% of which was counseling/coordinating care for TIPS

## 2016-03-27 NOTE — Progress Notes (Signed)
PULMONARY / CRITICAL CARE MEDICINE   Name: [name] MRN: [id number] DOB: 1960/06/20    ADMISSION DATE:  03/24/2016 CONSULTATION DATE:  03/24/16  REFERRING MD:  Bryce Carrel, NP-C / TRH   CHIEF COMPLAINT:  ETOH cirrhosis s/p TIPS, incarcerated umbilical hernia   HISTORY OF PRESENT ILLNESS:   55 y/o M, previously homeless (living with his brother currently), followed at [facility] with a PMH of depression, GERD, constipation, hypertension, COPD and ETOH cirrhosis with persistent ascites requiring weekly paracentesis who [facility] on 10/27 for planned TIPS procedure.    On arrival for procedure, labs were assessed and showed acute kidney injury (sr cr up to 1.34 from 0.53), hyponatremia (125), hypokalemia (3.3), glucose 152, alk phos 200, albumin 3.0, AST 107 / ALT 47, WBC 11.5, Hgb 17.6, platelets 143 and INR 1.47.  CXR showed a 52m pulmonary nodule on the left, no acute consolidation. The patient had previously been followed by hospice but was taken off for TIPS procedure.  Per admit notes, he reported increased abdominal pain after his last paracentesis (10/27), difficulty initiating flow with urination / incomplete emptying and his umbilical hernia seemed "bigger" than usual.  [facility].  Plan of care was to proceed with TIPS procedure 10/27.  He was electively intubated per anesthesia for the procedure.  During the imaging assessment for TIPS, it was discovered that he had an incarcerated hernia.  CCS was consulted for evaluation of incarcerated hernia.  It was anticipated that he will return post procedure to ICU on mechanical ventilation, PCCM consulted.    SUBJECTIVE:  No acute events overnight. Patient has remained off vasopressors. Patient is tolerating diet and getting out of bed to chair with assistance. Still remains altered but has no complaints at this time.  REVIEW OF SYSTEMS:  Unable to obtain with patient's  ongoing encephalopathy.  VITAL SIGNS: BP 131/86   Pulse 64   Temp 97.9 F (36.6 C) (Oral)   Resp 15   Ht 6' 1" (1.854 m)   Wt 153 lb 3.5 oz (69.5 kg)   SpO2 96%   BMI 20.21 kg/m   HEMODYNAMICS:    VENTILATOR SETTINGS:    INTAKE / OUTPUT: I/O last 3 completed shifts: In: 4700 [P.O.:1080; I.V.:2820; IV Piggyback:800] Out: 2025 [Urine:2025]  PHYSICAL EXAMINATION: General:  Awake. No acute distress. No family at bedside.  Integument:  Warm & dry. No rash on exposed skin. Abdominal dressing clean and dry. Tattoos noted. HEENT:  Moist mucus membranes. Poor dentition. Pupils symmetric. Cardiovascular:  Regular rate. No edema. No appreciable JVD.  Pulmonary:  Normal work of breathing on room air. Clear on auscultation. Symmetric chest wall expansion. Abdomen: Soft. Normal bowel sounds. Nondistended. Grossly nontender. Musculoskeletal:  Normal bulk and tone. No joint deformity or effusion appreciated. Neurological: Patient able to pivot and moved from chair back to bed with nursing support. Following commands. Answers questions but not oriented to year, place, president, or situation. Grossly nonfocal otherwise.  LABS:  BMET  Recent Labs Lab 03/25/16 0440 03/26/16 0400 03/27/16 0345  NA 129* 131* 132*  K 2.9* 3.6 4.2  CL 94* 103 101  CO2 _0 BUN 6 <5* <5*  CREATININE 0.60* 0.46* 0.61  GLUCOSE 132* 158* 137*    Electrolytes  Recent Labs Lab 03/25/16 0440 03/26/16 0400 03/27/16 0345  CALCIUM 7.4* 6.8* 7.4*  MG 1.5*  --   --   PHOS 2.3*  --   --  CBC  Recent Labs Lab 03/25/16 0440 03/26/16 0952 03/27/16 0345  WBC 9.7 10.0 12.8*  HGB 13.2 13.0 13.1  HCT 38.7* 38.9* 39.8  PLT 67* 47* 56*    Coag's  Recent Labs Lab 03/24/16 0816 03/25/16 0440 03/26/16 0952 03/27/16 0345  APTT 38* 49*  --   --   INR 1.47 1.88 2.09 1.74    Sepsis Markers  Recent Labs Lab 03/24/16 1700 03/25/16 0440 03/25/16 0900 03/26/16 0400  LATICACIDVEN  --    --  2.1*  --   PROCALCITON 0.27 0.62  --  0.17    ABG  Recent Labs Lab 03/24/16 1730  PHART 7.466*  PCO2ART 41.2  PO2ART 224*    Liver Enzymes  Recent Labs Lab 03/25/16 0440 03/26/16 0952 03/27/16 0345  AST 114* 148* 106*  ALT 33 54 48  ALKPHOS 114 116 109  BILITOT 2.8* 2.7* 1.9*  ALBUMIN 2.7* 2.3* 2.2*    Cardiac Enzymes  Recent Labs Lab 03/24/16 1731 03/24/16 2350 03/25/16 0440  TROPONINI 0.04* 0.05* 0.03*    Glucose  Recent Labs Lab 03/26/16 0801 03/26/16 1940 03/26/16 2343 03/27/16 0350 03/27/16 0800 03/27/16 1133  GLUCAP 143* 139* 144* 122* 123* 111*    Imaging No results found.   STUDIES:  CT CHEST/ABD/PELVIS W/O 10/27: IMPRESSION: 1. No suspicious pulmonary nodule. There is a small calcified granuloma in the lingula. 2. Umbilical hernia containing fluid-filled small bowel. There is proximal small bowel dilatation with air-fluid levels, supporting the presence of an incarcerated hernia. 3. Moderate ascites, attributed to underlying liver disease. 4. T12 superior endplate compression fracture with osseous retropulsion, potentially subacute. 5. Indeterminate 3.1 cm left renal lesion, measuring higher than water density. Statistically, this is a complex cyst. 6. Additional incidental findings including emphysema, bilateral rib fractures, hepatic steatosis, diffuse atherosclerosis and bladder wall thickening. RENAL U/S 10/28:  No renal obstruction.  Urinary bladder empty with Foley catheter. Mild ascites.  MICROBIOLOGY: Blood Ctx x2 10/27 >> MRSA PCR 10/30:  Negative   ANTIBIOTICS: Vancomycin 10/27 - 10/30 Zosyn 10/27 >>  SIGNIFICANT EVENTS: 10/03 - Paracentesis 4.1L yellow fluid 10/10 - Paracentesis w/ 4L amber fluid 10/16 - Paracentesis w/ 4L yellow fluid 10/25 - Paracentesis w/ 2.4L amber fluid 10/27 - Admit for TIPS, AKI, found to have incarcerated hernia / s/p TIPS, tops performed and hernia repair 10/28 - primary repair of  umbilical hernia with incarceration [name])  LINES/TUBES: OETT 7.5 10/27- 10/28 R IJ CVL (single lumen post TIPS) 10/27 >> L Radial Art Line 10/27 - out OGT 10/27 -10/28 PIV x2   ASSESSMENT / PLAN:  55 y.o. male with PMH of ETOH cirrhosis, previously on hospice, presented 10/27 for planned TIPS.  Ultimately admitted for acute renal failure.  Found to have incarcerated hernia during imaging for TIPS.  CCS evaluated, pending decision regarding surgical intervention. Starting broad-spectrum antibiotics. Pt improved with extubation 10/28 and off pressors 10/28. Patient continues to have encephalopathy this likely multifactorial but predominantly secondary to his cirrhosis in the setting of TIPS procedure. Patient's bowel function seems to be recovering quite well therefore I am escalating his oral dose of lactulose. Continuing to monitor his electrolytes closely with his hyponatremia and discontinuing his maintenance IV fluid for now as he is taking oral liquids and nutrition sufficiently. Switching the patient over to CIWA protocol while continuing scheduled oral Ativan. Continuing the patient on oral thiamine and folic acid as well. Clinically continuing to improve.  1. Septic Shock:  Shock has resolved. Blood  cultures pending. Discontinuing Vancomycin today. Continuing empiric Zosyn. 2. Possible Spontaneous Bacterial Peritonitis:  Continuing patient on empiric Zosyn for now with low threshold to discontinue if he continues to improve clinically.  3. Incarcerated Umbilical Hernia:  S/P surgical correction 10/28. Care per surgery service. 4. Encephalopathy: Likely multifactorial from chronic alcohol use as well as known liver cirrhosis with ascites. Now status post TIPS. Increasing lactulose to 3 times daily.  5. Alcoholic Liver Cirrhosis with Ascites:  Now s/p TIPS. Right IJ Catheter removed today. Plan for f/u in 4 weeks with Dr. Vernard Gambles in outpatient [facility]. Increasing lactulose to 3 times  daily. Switching to Ativan 68m PO TID, Thiamine PO daily, & FA PO daily. Continuing Aldactone 543mpo daily.  6. Chronic Alcohol Use & Depression:  Continuing Celexa and changing to Scheduled PO Ativan TID. Starting CIWA protocol. 7. Hyponatremia:  Mild. Monitoring daily electrolytes. Discontinuing normal saline maintenance IV fluid for now.  8. Transaminitis:  Mild & likely secondary to TIPS. Continuing to trend intermittently. 9. Thrombocytopenia:  Stable. Likely due to splenic sequestration. Trending cell counts daily w/ CBC. 10. Leukocytosis:  Mild. Likely due to combination demargination & sepsis. Trending cell counts daily w/ CBC. 11. Coagulopathy:  Likely due to poor synthetic function in the setting of liver cirrhosis. Repeat INR with morning labs. Giving Vitamin K 69m39mO x1 today. 12. Acute Renal Failure:  Resolved. Likely multifactorial. Continuing to monitoring electrolytes & renal function daily with labs. Monitoring urine output. 13. Tobacco Use Disorder:  Plan for tobacco cessation education prior to discharge. 14.52/O COPD:  No signs of exacerbation.  Continuing Albuterol nebs q2hr prn. 15.65/O Essential Hypertension:  Currently normotensive.  16. H/O GERD:  Continuing bid Pepcid in lieu of Zantac.  17. Acute Respiratory Failure:  Resolved. Extubated 10/28. Continuing incentive spirometry with pulmonary toilette.  18. Diet:  Mechanical Soft Diet. 19. Prophylaxis:  Pepcid PO bid, SCDs, & restarting Heparin [place] q8hr. 20. Disposition:  Transferring to a stepdown unit for further treatment and monitoring. Consulting PT for evaluation.   TRH to assume care & PCCM off as of 10/31.  JenSonia BallersAshok Cordia.D. [facility] LL[facility]monary [facility] Pager:  [phone] 3pm or if no response, call [phone] 1:39 PM 03/27/16

## 2016-03-28 DIAGNOSIS — R911 Solitary pulmonary nodule: Secondary | ICD-10-CM

## 2016-03-28 LAB — RENAL FUNCTION PANEL
ALBUMIN: 2 g/dL — AB (ref 3.5–5.0)
Anion gap: 4 — ABNORMAL LOW (ref 5–15)
BUN: 10 mg/dL (ref 6–20)
CALCIUM: 7.7 mg/dL — AB (ref 8.9–10.3)
CHLORIDE: 102 mmol/L (ref 101–111)
CO2: 28 mmol/L (ref 22–32)
CREATININE: 0.65 mg/dL (ref 0.61–1.24)
Glucose, Bld: 93 mg/dL (ref 65–99)
PHOSPHORUS: 1.5 mg/dL — AB (ref 2.5–4.6)
Potassium: 3.8 mmol/L (ref 3.5–5.1)
SODIUM: 134 mmol/L — AB (ref 135–145)

## 2016-03-28 LAB — PROTIME-INR
INR: 1.76
Prothrombin Time: 20.8 seconds — ABNORMAL HIGH (ref 11.4–15.2)

## 2016-03-28 LAB — CBC WITH DIFFERENTIAL/PLATELET
BASOS ABS: 0 10*3/uL (ref 0.0–0.1)
Basophils Relative: 0 %
Eosinophils Absolute: 0.1 10*3/uL (ref 0.0–0.7)
Eosinophils Relative: 0 %
HEMATOCRIT: 40.2 % (ref 39.0–52.0)
HEMOGLOBIN: 13.3 g/dL (ref 13.0–17.0)
LYMPHS PCT: 18 %
Lymphs Abs: 2.8 10*3/uL (ref 0.7–4.0)
MCH: 31.9 pg (ref 26.0–34.0)
MCHC: 33.1 g/dL (ref 30.0–36.0)
MCV: 96.4 fL (ref 78.0–100.0)
MONO ABS: 1.7 10*3/uL — AB (ref 0.1–1.0)
Monocytes Relative: 11 %
NEUTROS ABS: 11 10*3/uL — AB (ref 1.7–7.7)
NEUTROS PCT: 70 %
Platelets: 72 10*3/uL — ABNORMAL LOW (ref 150–400)
RBC: 4.17 MIL/uL — ABNORMAL LOW (ref 4.22–5.81)
RDW: 14.7 % (ref 11.5–15.5)
WBC: 15.5 10*3/uL — ABNORMAL HIGH (ref 4.0–10.5)

## 2016-03-28 LAB — GLUCOSE, CAPILLARY
GLUCOSE-CAPILLARY: 83 mg/dL (ref 65–99)
Glucose-Capillary: 98 mg/dL (ref 65–99)
Glucose-Capillary: 84 mg/dL (ref 65–99)

## 2016-03-28 LAB — MAGNESIUM: Magnesium: 2 mg/dL (ref 1.7–2.4)

## 2016-03-28 MED ORDER — BOOST / RESOURCE BREEZE PO LIQD
1.0000 | Freq: Three times a day (TID) | ORAL | Status: DC
  Administered 2016-03-28 – 2016-03-30 (×6): 1 via ORAL

## 2016-03-28 MED ORDER — SODIUM PHOSPHATES 45 MMOLE/15ML IV SOLN
40.0000 meq | Freq: Once | INTRAVENOUS | Status: AC
  Administered 2016-03-28: 40 meq via INTRAVENOUS
  Filled 2016-03-28: qty 10

## 2016-03-28 NOTE — Progress Notes (Signed)
Initial Nutrition Assessment  DOCUMENTATION CODES:   Severe malnutrition in context of chronic illness  INTERVENTION:    Boost Breeze po TID, each supplement provides 250 kcal and 9 grams of protein  NUTRITION DIAGNOSIS:   Malnutrition related to chronic illness as evidenced by estimated needs  GOAL:   Patient will meet greater than or equal to 90% of their needs  MONITOR:   PO intake, Supplement acceptance, Labs, Weight trends, Skin, I & O's  REASON FOR ASSESSMENT:   Malnutrition Screening Tool  ASSESSMENT:   55 y.o. male with a Past Medical History ESLD, previously on hospice and ETOH abuse who presents with abdominal pain.  Pt reports his appetite is OK >> stated "I try and eat what I can." Typically consumes about 2 meals per day at home. PO intake 75% per flowsheet records. Also reveals he's lost "a lot" of weight but unable to quantify amount. Would benefit from oral nutrition supplements >> will order.  Nutrition-Focused physical exam completed. Findings are severe fat depletion, severe muscle depletion, and no edema.   Diet Order:  DIET SOFT Room service appropriate? Yes; Fluid consistency: Thin  Skin:  Reviewed, no issues   CBG (last 3)   Recent Labs  03/27/16 1133 03/27/16 1650 03/28/16 0838  GLUCAP 111* 110* 83   Last BM:  10/30  Height:   Ht Readings from Last 1 Encounters:  03/24/16 6\' 1"  (1.854 m)    Weight:   Wt Readings from Last 1 Encounters:  03/28/16 156 lb 8.4 oz (71 kg)    Ideal Body Weight:  83.6 kg  BMI:  Body mass index is 20.65 kg/m.  Estimated Nutritional Needs:   Kcal:  1800-2000  Protein:  90-100 gm  Fluid:  1.8-2.0 L  EDUCATION NEEDS:   No education needs identified at this time  Maureen ChattersKatie Ellarae Nevitt, RD, LDN Pager #: [phone] After-Hours Pager #: [phone]

## 2016-03-28 NOTE — Progress Notes (Signed)
In and out cath performed by this RN, assisted by Lezlie LyeLisa Frei, RN. 800 mL of amber malodorous urine drained. Pt tolerated well. Will continue to monitor.  Herma ArdMOSELEY, Ezrie Bunyan F, RN

## 2016-03-28 NOTE — Evaluation (Signed)
Physical Therapy Evaluation Patient Details Name: Bryce MawRobert Garis MRN: [id number] DOB: 07/04/1960 Today's Date: 03/28/2016   History of Present Illness  Pt admitted 10/27 with incarcerated umbilical hernia. Pt underwent repair of hernia on 10/18.  Clinical Impression  Pt presenting with abdominal pain, generalized weakness, and limited endurance. Pt mod I with RW PTA now requires minA for safe mobility. At this time pt to benefit from ST-SNF upon d/c to address deficits however if pt progresses to safe supervision level of function pt could go home with brother and use of RW, however pt will need to negotiate 2 flights of stairs.    Follow Up Recommendations SNF;Supervision/Assistance - 24 hour    Equipment Recommendations  None recommended by PT    Recommendations for Other Services       Precautions / Restrictions Precautions Precautions: Fall Precaution Comments: pt fell yesterday 10/30 Restrictions Weight Bearing Restrictions: No      Mobility  Bed Mobility               General bed mobility comments: pt up in chair  Transfers Overall transfer level: Needs assistance   Transfers: Sit to/from Stand Sit to Stand: Min assist         General transfer comment: v/c's for hand placement  Ambulation/Gait Ambulation/Gait assistance: Min assist;+2 safety/equipment (chair follow) Ambulation Distance (Feet): 120 Feet Assistive device: 4-wheeled walker Gait Pattern/deviations: Step-through pattern;Decreased stride length;Narrow base of support Gait velocity: slow Gait velocity interpretation: Below normal speed for age/gender General Gait Details: v/c's to achieve erect posture  Stairs            Wheelchair Mobility    Modified Rankin (Stroke Patients Only)       Balance Overall balance assessment: Needs assistance         Standing balance support: Bilateral upper extremity supported Standing balance-Leahy Scale: Fair                                Pertinent Vitals/Pain Pain Assessment: 0-10 Pain Score: 4  Pain Location: surgical site    Home Living Family/patient expects to be discharged to:: Skilled nursing facility Living Arrangements: Other relatives (brother)               Additional Comments: pt reports he and his brother stay in a hotel. they don't drive but the care for each other    Prior Function Level of Independence: Independent with assistive device(s)         Comments: uses RW PRN     Hand Dominance   Dominant Hand: Right    Extremity/Trunk Assessment   Upper Extremity Assessment: Generalized weakness           Lower Extremity Assessment: Generalized weakness      Cervical / Trunk Assessment:  (abdominal surgery)  Communication   Communication: HOH  Cognition Arousal/Alertness: Awake/alert Behavior During Therapy: WFL for tasks assessed/performed Overall Cognitive Status: Within Functional Limits for tasks assessed                      General Comments      Exercises     Assessment/Plan    PT Assessment Patient needs continued PT services  PT Problem List Decreased strength;Decreased range of motion;Decreased mobility;Decreased balance;Decreased activity tolerance          PT Treatment Interventions DME instruction;Gait training;Stair training;Functional mobility training;Therapeutic activities;Therapeutic exercise;Balance training    PT Goals (  Current goals can be found in the Care Plan section)  Acute Rehab PT Goals Patient Stated Goal: go to rehab PT Goal Formulation: With patient Time For Goal Achievement: 04/04/16 Potential to Achieve Goals: Good    Frequency Min 3X/week   Barriers to discharge        Co-evaluation               End of Session Equipment Utilized During Treatment: Gait belt Activity Tolerance: Patient tolerated treatment well Patient left: in chair;with call bell/phone within reach;with nursing/sitter in  room Nurse Communication: Mobility status         Time: [id number] PT Time Calculation (min) (ACUTE ONLY): 19 min   Charges:   PT Evaluation $PT Eval Moderate Complexity: 1 Procedure     PT G CodesMarcene Brawn:        Randalyn Ahmed Marie 03/28/2016, 11:10 AM   Lewis ShockAshly Laramie Meissner, PT, DPT Pager #: [phone] Office #: [phone]

## 2016-03-28 NOTE — Progress Notes (Signed)
Referring Physician(s): C Joseph Art  Supervising Physician: Irish Lack  Patient Status:  [facility] - In-pt  Chief Complaint:  Refractory ascites TIPs 10/27  Subjective:  Better daily Much less confused today Up in chair Moved to stepdown  Allergies: Review of patient's allergies indicates no known allergies.  Medications: Prior to Admission medications   Medication Sig Start Date End Date Taking? Authorizing Provider  albuterol (PROVENTIL HFA;VENTOLIN HFA) 108 (90 Base) MCG/ACT inhaler Inhale 2 puffs into the lungs every 6 (six) hours as needed for wheezing or shortness of breath. 12/02/15  Yes [name], MD  citalopram (CELEXA) 20 MG tablet Take 1 tablet (20 mg total) by mouth daily. Patient taking differently: Take 20 mg by mouth every evening.  03/10/16  Yes [name], MD  diclofenac sodium (VOLTAREN) 1 % GEL Apply 4 g topically 4 (four) times daily. Patient taking differently: Apply 4 g topically 4 (four) times daily as needed (for pain).  10/21/15  Yes [name], MD  furosemide (LASIX) 20 MG tablet TAKE 1 TABLET BY MOUTH DAILY. Patient taking differently: TAKE 1 TABLET BY MOUTH DAILY IN THE AFTERNOON 03/02/16  Yes [name], MD  lactulose (CHRONULAC) 10 GM/15ML solution Take 15 mLs (10 g total) by mouth 2 (two) times daily. 07/22/15  Yes [name], MD  potassium chloride SA (K-DUR,KLOR-CON) 20 MEQ tablet Take 1 tablet (20 mEq total) by mouth daily. Patient taking differently: Take 20 mEq by mouth every evening.  03/10/16  Yes [name], MD  ranitidine (ZANTAC) 150 MG tablet Take 1 tablet (150 mg total) by mouth 2 (two) times daily. 11/22/15  Yes [name], MD  spironolactone (ALDACTONE) 50 MG tablet Take 1 tablet (50 mg total) by mouth daily. 03/10/16  Yes [name], MD     Vital Signs: BP 102/78 (BP Location: Left Arm)   Pulse 73   Temp 97.3 F (36.3 C) (Oral)   Resp 20   Ht 6\' 1"  (1.854 m)   Wt 156 lb 8.4 oz (71 kg)   SpO2 100%   BMI 20.65  kg/m   Physical Exam  Constitutional: He is oriented to person, place, and time.  Pulmonary/Chest: Effort normal.  Abdominal: Soft. He exhibits distension. There is no tenderness.  Musculoskeletal: Normal range of motion.  Neurological: He is alert and oriented to person, place, and time.  Skin: Skin is warm.  Psychiatric: He has a normal mood and affect.  Nursing note and vitals reviewed.   Imaging: Ct Abdomen Pelvis Wo Contrast  Addendum Date: 03/24/2016   ADDENDUM REPORT: 03/24/2016 16:37 ADDENDUM: These results were called by telephone at the time of interpretation on 03/24/2016 at 4:20 pm to Dr. Claud Kelp, who verbally acknowledged these results. Electronically Signed   By: Carey Bullocks M.D.   On: 03/24/2016 16:37   Result Date: 03/24/2016 CLINICAL DATA:  Incarcerated umbilical hernia. Left lung nodule. Post TIPS procedure today. EXAM: CT CHEST, ABDOMEN AND PELVIS WITHOUT CONTRAST TECHNIQUE: Multidetector CT imaging of the chest, abdomen and pelvis was performed following the standard protocol without IV contrast. COMPARISON:  Chest radiographs 03/24/2016. FINDINGS: CT CHEST FINDINGS Cardiovascular: Atherosclerosis of the aorta, great vessels and coronary arteries. Right IJ central venous catheter extends into the superior aspect of the right atrium. The heart size is normal. There is no pericardial effusion. Mediastinum/Nodes: There are no enlarged mediastinal, hilar or axillary lymph nodes. Endotracheal and nasogastric tubes are in place. There is possible wall thickening of the distal esophagus. The thyroid gland  and trachea demonstrate no significant findings. Lungs/Pleura: There is no pleural effusion. Mild emphysema. There is a small calcified granuloma in the lingula on image 70 which may account for the radiographic finding. No suspicious pulmonary nodules. There is no confluent airspace opacity or pneumothorax. Musculoskeletal/Chest wall: Bilateral gynecomastia noted. Old  rib fractures are present bilaterally. Fractures of the left eighth and ninth ribs may be incompletely healed. There is a fracture of the T12 vertebral body with associated mild osseous retropulsion. This is not clearly seen on chest radiographs from April and could be subacute in age. CT ABDOMEN AND PELVIS FINDINGS Hepatobiliary: Diffuse hepatic steatosis. No focal hepatic abnormalities are seen on noncontrast imaging. There is high-density material layering within the gallbladder lumen. This is likely due to a combination of gallstones and vicarious excretion of contrast. No gallbladder wall thickening or significant biliary dilatation seen. Pancreas: Unremarkable. No pancreatic ductal dilatation or surrounding inflammatory changes. Spleen: Normal in size without focal abnormality. Adrenals/Urinary Tract: Both adrenal glands appear normal. There is contrast material within the renal collecting systems bilaterally, attributed to previous interventional procedure. Low-density renal lesions are present bilaterally, likely cysts. One in the lower pole of the left kidney measures 3.1 cm and 21 HU. No evidence of hydronephrosis. Bladder is partially decompressed by a Foley catheter. There is bladder wall thickening. Stomach/Bowel: The stomach is decompressed by a nasogastric tube. The proximal small bowel is fluid filled with scattered air-fluid levels and mild dilatation. There is an umbilical hernia containing small bowel, measuring up to 7.0 x 8.9 cm transverse. The distal small bowel is decompressed. No colonic abnormalities are seen. There is a small periampullary duodenal diverticulum. Vascular/Lymphatic: There are no enlarged abdominal or pelvic lymph nodes. Diffuse atherosclerosis of the aorta and its branches. TIPS extends from the portal vein to the junction of the IVC with the right hepatic vein. Patency not addressed by this examination. No evidence of surrounding hematoma. Probable upper abdominal  collateral vessels/varices. Reproductive: Mild enlargement of the prostate gland which demonstrates dystrophic calcifications. The seminal vesicles appear unremarkable. Other: As above, umbilical hernia containing small bowel. There is a moderate amount of ascites, with most of the fluid in the pelvis. Musculoskeletal: No acute or significant osseous findings. IMPRESSION: 1. No suspicious pulmonary nodule. There is a small calcified granuloma in the lingula. 2. Umbilical hernia containing fluid-filled small bowel. There is proximal small bowel dilatation with air-fluid levels, supporting the presence of an incarcerated hernia. 3. Moderate ascites, attributed to underlying liver disease. 4. T12 superior endplate compression fracture with osseous retropulsion, potentially subacute. 5. Indeterminate 3.1 cm left renal lesion, measuring higher than water density. Statistically, this is a complex cyst. 6. Additional incidental findings including emphysema, bilateral rib fractures, hepatic steatosis, diffuse atherosclerosis and bladder wall thickening. Electronically Signed: By: Carey Bullocks M.D. On: 03/24/2016 16:07   Ct Chest Wo Contrast  Addendum Date: 03/24/2016   ADDENDUM REPORT: 03/24/2016 16:37 ADDENDUM: These results were called by telephone at the time of interpretation on 03/24/2016 at 4:20 pm to Dr. Claud Kelp, who verbally acknowledged these results. Electronically Signed   By: Carey Bullocks M.D.   On: 03/24/2016 16:37   Result Date: 03/24/2016 CLINICAL DATA:  Incarcerated umbilical hernia. Left lung nodule. Post TIPS procedure today. EXAM: CT CHEST, ABDOMEN AND PELVIS WITHOUT CONTRAST TECHNIQUE: Multidetector CT imaging of the chest, abdomen and pelvis was performed following the standard protocol without IV contrast. COMPARISON:  Chest radiographs 03/24/2016. FINDINGS: CT CHEST FINDINGS Cardiovascular: Atherosclerosis of the aorta,  great vessels and coronary arteries. Right IJ central venous  catheter extends into the superior aspect of the right atrium. The heart size is normal. There is no pericardial effusion. Mediastinum/Nodes: There are no enlarged mediastinal, hilar or axillary lymph nodes. Endotracheal and nasogastric tubes are in place. There is possible wall thickening of the distal esophagus. The thyroid gland and trachea demonstrate no significant findings. Lungs/Pleura: There is no pleural effusion. Mild emphysema. There is a small calcified granuloma in the lingula on image 70 which may account for the radiographic finding. No suspicious pulmonary nodules. There is no confluent airspace opacity or pneumothorax. Musculoskeletal/Chest wall: Bilateral gynecomastia noted. Old rib fractures are present bilaterally. Fractures of the left eighth and ninth ribs may be incompletely healed. There is a fracture of the T12 vertebral body with associated mild osseous retropulsion. This is not clearly seen on chest radiographs from April and could be subacute in age. CT ABDOMEN AND PELVIS FINDINGS Hepatobiliary: Diffuse hepatic steatosis. No focal hepatic abnormalities are seen on noncontrast imaging. There is high-density material layering within the gallbladder lumen. This is likely due to a combination of gallstones and vicarious excretion of contrast. No gallbladder wall thickening or significant biliary dilatation seen. Pancreas: Unremarkable. No pancreatic ductal dilatation or surrounding inflammatory changes. Spleen: Normal in size without focal abnormality. Adrenals/Urinary Tract: Both adrenal glands appear normal. There is contrast material within the renal collecting systems bilaterally, attributed to previous interventional procedure. Low-density renal lesions are present bilaterally, likely cysts. One in the lower pole of the left kidney measures 3.1 cm and 21 HU. No evidence of hydronephrosis. Bladder is partially decompressed by a Foley catheter. There is bladder wall thickening.  Stomach/Bowel: The stomach is decompressed by a nasogastric tube. The proximal small bowel is fluid filled with scattered air-fluid levels and mild dilatation. There is an umbilical hernia containing small bowel, measuring up to 7.0 x 8.9 cm transverse. The distal small bowel is decompressed. No colonic abnormalities are seen. There is a small periampullary duodenal diverticulum. Vascular/Lymphatic: There are no enlarged abdominal or pelvic lymph nodes. Diffuse atherosclerosis of the aorta and its branches. TIPS extends from the portal vein to the junction of the IVC with the right hepatic vein. Patency not addressed by this examination. No evidence of surrounding hematoma. Probable upper abdominal collateral vessels/varices. Reproductive: Mild enlargement of the prostate gland which demonstrates dystrophic calcifications. The seminal vesicles appear unremarkable. Other: As above, umbilical hernia containing small bowel. There is a moderate amount of ascites, with most of the fluid in the pelvis. Musculoskeletal: No acute or significant osseous findings. IMPRESSION: 1. No suspicious pulmonary nodule. There is a small calcified granuloma in the lingula. 2. Umbilical hernia containing fluid-filled small bowel. There is proximal small bowel dilatation with air-fluid levels, supporting the presence of an incarcerated hernia. 3. Moderate ascites, attributed to underlying liver disease. 4. T12 superior endplate compression fracture with osseous retropulsion, potentially subacute. 5. Indeterminate 3.1 cm left renal lesion, measuring higher than water density. Statistically, this is a complex cyst. 6. Additional incidental findings including emphysema, bilateral rib fractures, hepatic steatosis, diffuse atherosclerosis and bladder wall thickening. Electronically Signed: By: Carey BullocksWilliam  Veazey M.D. On: 03/24/2016 16:07   [place] Renal  Result Date: 03/25/2016 CLINICAL DATA:  Urinary retention.  Cirrhosis of liver. EXAM: RENAL /  URINARY TRACT ULTRASOUND COMPLETE COMPARISON:  Ultrasound 03/22/2016. FINDINGS: Right Kidney: Length: 11.5 cm. Echogenicity within normal limits. No mass or hydronephrosis visualized. Left Kidney: Length: 13.0 cm. 3 cm left lower pole renal cyst.  Echogenicity within normal limits. No mass or hydronephrosis visualized. Bladder: The urinary bladder empty.  Foley catheter in the bladder Mild ascites. IMPRESSION: No renal obstruction.  Urinary bladder empty with Foley catheter Mild ascites, significantly improved from ultrasound of 03/22/2016 Electronically Signed   By: Marlan Palau M.D.   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 Recurrent large volume ascites requiring nearly weekly paracentesis. See previous consultation. EXAM: TRANSJUGULAR INTRAHEPATIC PORTOSYSTEMIC SHUNT MEDICATIONS: As antibiotic prophylaxis, cefazolin 2 g was ordered pre-procedure and administered intravenously within one hour of incision. ANESTHESIA/SEDATION: General - as administered by the Anesthesia department CONTRAST:  80 mL Isovue FLUOROSCOPY TIME:  7 minutes 18 seconds, 895 mGy COMPLICATIONS: None immediate. PROCEDURE: Informed written consent was obtained from the patient after a thorough discussion of the procedural risks, benefits and alternatives. All questions were addressed. Maximal Sterile Barrier Technique was utilized including caps, mask, sterile gowns, sterile gloves, sterile drape, hand hygiene and skin antiseptic. A timeout was performed prior to the initiation of the procedure. Under ultrasound guidance, a 21 gauge micropuncture needle was advanced percutaneously into a right portal vein branch. A 018 guidewire was advanced into the main portal vein. 3French micro dilator was used for contrast injection to confirm portal venous positioning. A 5 French micropuncture dilator was placed with Touhy attachment. Confirmatory portal venography performed.  Under ultrasound guidance, the right IJ vein was localized. The vessel was accessed with a 21-gauge micropuncture needle, exchanged over a 018 guidewire for a transitional dilator, through which a 035 guidewire was advanced. After tract dilatation, a 10 French curved vascular sheath was placed, advanced into the IVC. Through this, a 5 French angled angiographic catheter was advanced and used to catheterize the right hepatic vein. Confirmatory right hepatic venography performed. Portal and hepatic venous pressure measurements were obtained, demonstrating mean portosystemic gradient. The 018 guidewire was positioned with its transition point at the confluence of anterior and posterior right portal vein branches as a target. The [place] needle and sheath were advanced through the primary sheath, and the needle advanced into the right portal vein branch under biplane fluoroscopy guidance. After confirmatory contrast injection, an angled stiff glidewire was advanced into the splenic vein. The needle was exchanged for the 5 French angiographic catheter, advanced into the splenic vein for confirmatory portal venography. This demonstrated multiple esophageal varices and slow antegrade flow through the portal venous system. Measuring pigtail catheter advanced for planning portal venogram. This was exchanged for a a 7 mm Mustang angioplasty catheter used to dilate the parenchymal tract. This allowed advancement of the 10 French sheath into the portal vein. The 10 mm x 8 cm ViaTorr stent was advanced and deployed from right portal vein to right hepatic vein. The stent was dilated with a 10 mm angioplasty balloon. Follow-up pigtail catheter portal venogram demonstrates brisk antegrade flow through the TIPS, much less filling of a variceal channels, and minimal antegrade flow into intrahepatic portal vein branches. There is a nonocclusive mural thrombus near the portosplenic confluence. Pressure measurements reveal a 6  mm Hg portosystemic gradient. The pigtail catheter was withdrawn. The sheath was exchanged for a 25 cm 10 French sheath left with the tip at the SVC/RA junction as venous access. The sheath was secured externally with 0 Prolene suture and placed to a saline flush. The patient tolerated the procedure well. He was transferred to CT for previously planned imaging. IMPRESSION: 1. Technically successful creation of transjugular  intrahepatic portosystemic shunt (TIPS) with reduction in portosystemic gradient from 14 to 6 mmHg Patient will be admitted to the hospitalist service for overnight observation. Outpatient follow-up with a TIPS ultrasound in 1 month. Electronically Signed   By: D  Hassell M.D.   On: 10/Corlis Leak27/2017 16:37   Dg Chest Port 1 View  Result Date: 03/24/2016 CLINICAL DATA:  Postop, line placement EXAM: PORTABLE CHEST 1 VIEW COMPARISON:  03/24/2016 FINDINGS: Endotracheal tube is 5 cm above the carina. NG tube enters the stomach. Right internal jugular vascular sheath in place with the tip in the upper right atrium. No pneumothorax. Heart is normal size. No confluent airspace opacities or effusions. No acute bony abnormality. Old left rib fractures again noted, unchanged. IMPRESSION: No active disease. Electronically Signed   By: Charlett NoseKevin  Dover M.D.   On: 03/24/2016 16:48    Labs:  CBC:  Recent Labs  03/25/16 0440 03/26/16 0952 03/27/16 0345 03/28/16 0207  WBC 9.7 10.0 12.8* 15.5*  HGB 13.2 13.0 13.1 13.3  HCT 38.7* 38.9* 39.8 40.2  PLT 67* 47* 56* 72*    COAGS:  Recent Labs  09/16/15 1719  02/05/16 1010 03/24/16 0816 03/25/16 0440 03/26/16 0952 03/27/16 0345 03/28/16 0207  INR 1.36  < > 1.19 1.47 1.88 2.09 1.74 1.76  APTT 37  --  35 38* 49*  --   --   --   < > = values in this interval not displayed.  BMP:  Recent Labs  03/25/16 0440 03/26/16 0400 03/27/16 0345 03/28/16 0207  NA 129* 131* 132* 134*  K 2.9* 3.6 4.2 3.8  CL 94* 103 101 102  CO2 28 23 28 28     GLUCOSE 132* 158* 137* 93  BUN 6 <5* <5* 10  CALCIUM 7.4* 6.8* 7.4* 7.7*  CREATININE 0.60* 0.46* 0.61 0.65  GFRNONAA >60 >60 >60 >60  GFRAA >60 >60 >60 >60    LIVER FUNCTION TESTS:  Recent Labs  03/24/16 0816 03/25/16 0440 03/26/16 0952 03/27/16 0345 03/28/16 0207  BILITOT 2.0* 2.8* 2.7* 1.9*  --   AST 107* 114* 148* 106*  --   ALT 47 33 54 48  --   ALKPHOS 200* 114 116 109  --   PROT 7.8 5.4* 5.0* 4.9*  --   ALBUMIN 3.0* 2.7* 2.3* 2.2* 2.0*    Assessment and Plan:  Refractory ascites TIPS procedure in IR 10/27 Cognition better daily abd slight distended---likely recurrent ascites Will plan for paracentesis as needed Plan for 4 week follow up--orders in chart  Electronically Signed: Trinty Marken A 03/28/2016, 10:41 AM   I spent a total of 15 Minutes at the the patient's bedside AND on the patient's hospital floor or unit, greater than 50% of which was counseling/coordinating care for TIPs

## 2016-03-28 NOTE — Progress Notes (Signed)
eLink Physician-Brief Progress Note Patient Name: Bryce MawRobert [name] DOB: 11/24/1960 MRN: [id number]   Date of Service  03/28/2016  HPI/Events of Note  Nurse calls as patient with decreased urine output. Bladder scan shows significant urine.   eICU Interventions  Plan for straight cath for now.      Intervention Category Intermediate Interventions: Other:  Daneen SchickJose Angelo A De Dios 03/28/2016, 6:03 AM

## 2016-03-28 NOTE — Progress Notes (Signed)
3 Days Post-Op  Subjective: Patient ambulating with PT Awake, more alert  Objective: Vital signs in last 24 hours: Temp:  [97.4 F (36.3 C)-98.4 F (36.9 C)] 97.5 F (36.4 C) (10/31 0841) Pulse Rate:  [50-90] 73 (10/31 0800) Resp:  [12-22] 20 (10/31 0800) BP: (95-139)/(64-86) 102/78 (10/31 0800) SpO2:  [94 %-100 %] 100 % (10/31 0800) Weight:  [71 kg (156 lb 8.4 oz)] 71 kg (156 lb 8.4 oz) (10/31 0500) Last BM Date: 03/27/16  Intake/Output from previous day: 10/30 0701 - 10/31 0700 In: 1172.5 [P.O.:960; I.V.:75; IV Piggyback:137.5] Out: 1200 [Urine:1200] Intake/Output this shift: No intake/output data recorded.  General appearance: alert, cooperative and no distress GI: soft, mildly distended; more ascites than yesterday Incision mostly intact; tiny bit of bloody drainage at right end of incision; no ascitic leak No sign of infection in incision Lab Results:   Recent Labs  03/27/16 0345 03/28/16 0207  WBC 12.8* 15.5*  HGB 13.1 13.3  HCT 39.8 40.2  PLT 56* 72*   BMET  Recent Labs  03/27/16 0345 03/28/16 0207  NA 132* 134*  K 4.2 3.8  CL 101 102  CO2 28 28  GLUCOSE 137* 93  BUN <5* 10  CREATININE 0.61 0.65  CALCIUM 7.4* 7.7*   PT/INR  Recent Labs  03/27/16 0345 03/28/16 0207  LABPROT 20.5* 20.8*  INR 1.74 1.76   ABG No results for input(s): PHART, HCO3 in the last 72 hours.  Invalid input(s): PCO2, PO2  Studies/Results: No results found.  Anti-infectives: Anti-infectives    Start     Dose/Rate Route Frequency Ordered Stop   03/25/16 0800  ceFAZolin (ANCEF) IVPB 2g/100 mL premix  Status:  Discontinued     2 g 200 mL/hr over 30 Minutes Intravenous To ShortStay Surgical 03/24/16 2033 03/25/16 0404   03/25/16 0600  vancomycin (VANCOCIN) 500 mg in sodium chloride 0.9 % 100 mL IVPB  Status:  Discontinued     500 mg 100 mL/hr over 60 Minutes Intravenous Every 12 hours 03/24/16 1659 03/27/16 1357   03/24/16 1800  piperacillin-tazobactam (ZOSYN)  IVPB 3.375 g     3.375 g 12.5 mL/hr over 240 Minutes Intravenous Every 8 hours 03/24/16 1655     03/24/16 1730  vancomycin (VANCOCIN) 1,250 mg in sodium chloride 0.9 % 250 mL IVPB     1,250 mg 166.7 mL/hr over 90 Minutes Intravenous To Post Anesthesia Care Unit 03/24/16 1655 03/25/16 1730   03/24/16 0815  ceFAZolin (ANCEF) IVPB 2g/100 mL premix     2 g 200 mL/hr over 30 Minutes Intravenous To Short Stay 03/24/16 0757 03/24/16 1240   03/24/16 0759  ceFAZolin (ANCEF) 2-4 GM/100ML-% IVPB    Comments:  Marrianne MoodAltman, [name]   : cabinet override      03/24/16 0759 03/24/16 1225      Assessment/Plan: Incarcerated umbilical hernia S/p Umbilical hernia repair 10/28, Dr. Luisa Hartornett - WBC increased more today afebrile, benign abdominal exam - no wound infection - outpatient F/U w/ [facility] in 2-3 weeks  Cirrhosis/refractory ascites s/p TIPS 10/27 - scheduled for paracentesis 11/1  - outpatient F/U TIPS U/S and IR clinic visit in 4 weeks.  FEN: soft  ID: Ancef one dose 10/27, Zosyn 10/28 >>, Vancomycin 10/28 >> VTE: [place] heparin, SCD's  Plan:  Continue zofran PRN for nausea, continue soft diet Stable for transfer out of unit from a surgical standpoint  Would look for other sources of leukocytosis  LOS: 4 days    Bryce Sweaney K. 03/28/2016

## 2016-03-28 NOTE — Progress Notes (Signed)
PROGRESS NOTE    [name]  ZOX:[id number] DOB: 23-Dec-1960 DOA: 03/24/2016 PCP: Jaclyn ShaggyEnobong, [name], MD   Brief Narrative:  55 y.o. WM PMHx Depression, EtOH abuse, EtOH induced Liver Cirrhosis decompensated with the care of Hospice, COPD, Chronic Respiratory Failure not on home oxygen, Persistent Ascites with weekly Paracentesis, HTN,  Presents to short stay for TIPS procedure. We are asked to admit to medical service given patient's acute kidney injury  Information is obtained from the chart and the patient. He states he's been, to the hospital every week to have "fluid drained off my belly". He reports some soreness postprocedure but lately abdominal pain worsened. He complains of difficulty initiating flow when urinating denies weak stream but does not feel his bladder is empty when he is finished voiding. He denies headache dizziness syncope or near-syncope. He denies chest pain palpitations worsening shortness of breath or cough. He denies fever chills lower extremity edema recent travel or sick contacts. He also reports his umbilical hernia seems "bigger" and says most recent paracentesis.   Subjective: 10/31 A/O 4, NAD, states post TIPS feels significantly improved.   Assessment & Plan:   Principal Problem:   AKI (acute kidney injury) (HCC) Active Problems:   Thrombocytopenia (HCC)   Alcoholic cirrhosis of liver with ascites (HCC)   Tobacco abuse   Decompensated hepatic cirrhosis (HCC)   Hypokalemia   Protein-calorie malnutrition (HCC)   Ascites   Chronic respiratory failure (HCC)   Chronic liver disease and cirrhosis (HCC)   GERD (gastroesophageal reflux disease)   Umbilical hernia, incarcerated   Pulmonary nodule   Cirrhosis (HCC)   Acute kidney injury (HCC)   Acute renal failure with tubular necrosis (HCC)  Septic shock -Resolved -Continue empiric Zosyn per PCCM: If patient afebrile overnight D/C antibiotic  SBP -See septic shock  Incarcerated umbilical  hernia -10/20 S/P surgical repair -Care per surgery  Encephalopathy -Multifactorial to include EtOH liver cirrhosis, infection. Resolved -S/P TIPS procedure -Lactulose 10 g TID -Monitor Ammonia level - f/u in 4 weeks with Dr. Deanne CofferHassell in outpatient IR Clinic  EtOH liver cirrhosis -See encephalopathy -Per [facility] note plan for paracentesis on 11/1  Thrombocytopenia   Coagulopathy (baseline INR 1.19-1.36) Recent Labs Lab 03/24/16 0816 03/25/16 0440 03/26/16 0952 03/27/16 0345 03/28/16 0207  INR 1.47 1.88 2.09 1.74 1.76  -No signs of active bleeding  Acute renal failure Lab Results  Component Value Date   CREATININE 0.65 03/28/2016   CREATININE 0.61 03/27/2016   CREATININE 0.46 (L) 03/26/2016  -Resolved  Hypophosphatemia -Sodium phosphate 40 mEq    DVT prophylaxis: Subcutaneous heparin Code Status: Full Family Communication: None Disposition Plan: Is patient able to be placed back on hospice?   [facility]  [facility]Adam Henn Interventional radiology Dr.Matthew Tsuei [facility]      Procedures/Significant Events:  10/03 - Paracentesis 4.1L yellow fluid 10/10 - Paracentesis w/ 4L amber fluid 10/16 - Paracentesis w/ 4L yellow fluid 10/25 - Paracentesis w/ 2.4L amber fluid 10/27 - Admit for TIPS, AKI, found to have incarcerated hernia / s/p TIPS, tops performed and hernia repair 10/27 CT CHEST/ABD/PELVIS W/O 10/27: IMPRESSION: 1. No suspicious pulmonary nodule. There is a small calcified granuloma in the lingula. 2. Umbilical hernia containing fluid-filled small bowel. There is proximal small bowel dilatation with air-fluid levels, supporting the presence of an incarcerated hernia. 3. Moderate ascites, attributed to underlying liver disease. 4. T12 superior endplate compression fracture with osseous retropulsion, potentially subacute. 5. Indeterminate 3.1 cm left renal lesion, measuring higher than water  density. Statistically, this is a complex cyst. 6. Additional  incidental findings including emphysema, bilateral rib fractures, hepatic steatosis, diffuse atherosclerosis and bladder wall thickening. 10/28 RENAL U/S 10/28:  No renal obstruction. Urinary bladder empty with Foley catheter. Mild ascites. 10/28 - primary repair of umbilical hernia with incarceration Maisie Fus Cornett)    Cultures Blood Ctx x2 10/27 >> MRSA PCR 10/30:  Negative     Antimicrobials: Vancomycin 10/27 - 10/30 Zosyn 10/27 >>   Devices    LINES / TUBES:  OETT 7.5 10/27- 10/28 R IJ CVL (single lumen post TIPS) 10/27 >> L Radial Art Line 10/27 - out OGT 10/27 -10/28 PIV x2       Continuous Infusions:    Objective: Vitals:   03/28/16 0400 03/28/16 0500 03/28/16 0600 03/28/16 0700  BP: 97/74 103/75 99/75 107/81  Pulse: 62 63 70 64  Resp: 17 12 (!) 22 15  Temp: 98.3 F (36.8 C)     TempSrc: Oral     SpO2: 98% 97% 94% 98%  Weight:  71 kg (156 lb 8.4 oz)    Height:        Intake/Output Summary (Last 24 hours) at 03/28/16 0839 Last data filed at 03/28/16 0630  Gross per 24 hour  Intake           1097.5 ml  Output             1200 ml  Net           -102.5 ml   Filed Weights   03/26/16 0600 03/27/16 0500 03/28/16 0500  Weight: 66.4 kg (146 lb 6.2 oz) 69.5 kg (153 lb 3.5 oz) 71 kg (156 lb 8.4 oz)    Examination:  General: A/O 4, NAD, No acute respiratory distress Eyes: negative scleral hemorrhage, negative anisocoria, negative icterus ENT: Negative Runny nose, negative gingival bleeding, Neck:  Negative scars, masses, torticollis, lymphadenopathy, JVD Lungs: Clear to auscultation bilaterally without wheezes or crackles Cardiovascular: Regular rate and rhythm without murmur gallop or rub normal S1 and S2 Abdomen: negative abdominal pain, nondistended, positive soft, bowel sounds, no rebound, no ascites, no appreciable mass, abdomen mildly tender (appropriate post surgery). Patient with abdominal binder on did not remove for exam. Extremities: No  significant cyanosis, clubbing, or edema bilateral lower extremities Skin: Negative rashes, lesions, ulcers Psychiatric:  Negative depression, negative anxiety, negative fatigue, negative mania  Central nervous system:  Cranial nerves II through XII intact, tongue/uvula midline, all extremities muscle strength 5/5, sensation intact throughout,  negative dysarthria, negative expressive aphasia, negative receptive aphasia.  .     Data Reviewed: Care during the described time interval was provided by me .  I have reviewed this patient's available data, including medical history, events of note, physical examination, and all test results as part of my evaluation. I have personally reviewed and interpreted all radiology studies.  CBC:  Recent Labs Lab 03/24/16 0816 03/25/16 0440 03/26/16 0952 03/27/16 0345 03/28/16 0207  WBC 11.5* 9.7 10.0 12.8* 15.5*  NEUTROABS 8.9* 7.5 8.4*  --  11.0*  HGB 17.6* 13.2 13.0 13.1 13.3  HCT 47.8 38.7* 38.9* 39.8 40.2  MCV 88.8 92.4 93.5 95.7 96.4  PLT 143* 67* 47* 56* 72*   Basic Metabolic Panel:  Recent Labs Lab 03/24/16 0816 03/25/16 0440 03/26/16 0400 03/27/16 0345 03/28/16 0207  NA 125* 129* 131* 132* 134*  K 3.3* 2.9* 3.6 4.2 3.8  CL 83* 94* 103 101 102  CO2 26 28 23 28 28   GLUCOSE  152* 132* 158* 137* 93  BUN 9 6 <5* <5* 10  CREATININE 1.34* 0.60* 0.46* 0.61 0.65  CALCIUM 8.9 7.4* 6.8* 7.4* 7.7*  MG  --  1.5*  --   --  2.0  PHOS  --  2.3*  --   --  1.5*   GFR: Estimated Creatinine Clearance: 104.8 mL/min (by C-G formula based on SCr of 0.65 mg/dL). Liver Function Tests:  Recent Labs Lab 03/24/16 0816 03/25/16 0440 03/26/16 0952 03/27/16 0345 03/28/16 0207  AST 107* 114* 148* 106*  --   ALT 47 33 54 48  --   ALKPHOS 200* 114 116 109  --   BILITOT 2.0* 2.8* 2.7* 1.9*  --   PROT 7.8 5.4* 5.0* 4.9*  --   ALBUMIN 3.0* 2.7* 2.3* 2.2* 2.0*   No results for input(s): LIPASE, AMYLASE in the last 168 hours.  Recent Labs Lab  03/25/16 1330 03/26/16 0955  AMMONIA 102* 99*   Coagulation Profile:  Recent Labs Lab 03/24/16 0816 03/25/16 0440 03/26/16 0952 03/27/16 0345 03/28/16 0207  INR 1.47 1.88 2.09 1.74 1.76   Cardiac Enzymes:  Recent Labs Lab 03/24/16 1731 03/24/16 2350 03/25/16 0440  TROPONINI 0.04* 0.05* 0.03*   BNP (last 3 results) No results for input(s): PROBNP in the last 8760 hours. HbA1C: No results for input(s): HGBA1C in the last 72 hours. CBG:  Recent Labs Lab 03/26/16 2343 03/27/16 0350 03/27/16 0800 03/27/16 1133 03/27/16 1650  GLUCAP 144* 122* 123* 111* 110*   Lipid Profile:  Recent Labs  03/26/16 0400 03/27/16 0345  TRIG 37 40   Thyroid Function Tests: No results for input(s): TSH, T4TOTAL, FREET4, T3FREE, THYROIDAB in the last 72 hours. Anemia Panel: No results for input(s): VITAMINB12, FOLATE, FERRITIN, TIBC, IRON, RETICCTPCT in the last 72 hours. Urine analysis:    Component Value Date/Time   COLORURINE AMBER (A) 09/06/2015 0714   APPEARANCEUR CLEAR 09/06/2015 0714   LABSPEC 1.017 09/06/2015 0714   PHURINE 6.0 09/06/2015 0714   GLUCOSEU NEGATIVE 09/06/2015 0714   HGBUR TRACE (A) 09/06/2015 0714   BILIRUBINUR SMALL (A) 09/06/2015 0714   KETONESUR 15 (A) 09/06/2015 0714   PROTEINUR NEGATIVE 09/06/2015 0714   UROBILINOGEN 0.2 04/11/2015 0800   NITRITE POSITIVE (A) 09/06/2015 0714   LEUKOCYTESUR SMALL (A) 09/06/2015 0714   Sepsis Labs: @LABRCNTIP (procalcitonin:4,lacticidven:4)  ) Recent Results (from the past 240 hour(s))  Culture, blood (routine x 2)     Status: None (Preliminary result)   Collection Time: 03/24/16  5:36 PM  Result Value Ref Range Status   Specimen Description BLOOD RIGHT HAND  Final   Special Requests BOTTLES DRAWN AEROBIC AND ANAEROBIC 5CC  Final   Culture NO GROWTH 3 DAYS  Final   Report Status PENDING  Incomplete  Culture, blood (routine x 2)     Status: None (Preliminary result)   Collection Time: 03/24/16  5:36 PM    Result Value Ref Range Status   Specimen Description BLOOD A-LINE  Final   Special Requests BOTTLES DRAWN AEROBIC AND ANAEROBIC 5CC  Final   Culture NO GROWTH 3 DAYS  Final   Report Status PENDING  Incomplete  Surgical PCR screen     Status: None   Collection Time: 03/27/16 10:30 AM  Result Value Ref Range Status   MRSA, PCR NEGATIVE NEGATIVE Final   Staphylococcus aureus NEGATIVE NEGATIVE Final    Comment:        The Xpert SA Assay (FDA approved for NASAL specimens in patients over  55 years of age), is one component of a comprehensive surveillance program.  Test performance has been validated by [facility] for patients greater than or equal to 55 year old. It is not intended to diagnose infection nor to guide or monitor treatment.          Radiology Studies: No results found.      Scheduled Meds: . citalopram  20 mg Oral QPM  . famotidine  20 mg Oral BID  . folic acid  1 mg Oral Daily  . heparin subcutaneous  5,000 Units Subcutaneous Q8H  . lactulose  10 g Oral TID  . LORazepam  1 mg Oral TID  . piperacillin-tazobactam (ZOSYN)  IV  3.375 g Intravenous Q8H  . sodium chloride flush  3 mL Intravenous Q12H  . spironolactone  50 mg Oral Daily  . thiamine  100 mg Oral Daily   Continuous Infusions:    LOS: 4 days    Time spent: 40 minutes    WOODS, Roselind MessierURTIS J, MD [facility] Pager [phone]   If 7PM-7AM, please contact night-coverage [email or web address] Password [email or web address] 03/28/2016, 8:39 AM

## 2016-03-29 ENCOUNTER — Ambulatory Visit (HOSPITAL_COMMUNITY): Payer: Medicaid Other

## 2016-03-29 LAB — GLUCOSE, CAPILLARY
GLUCOSE-CAPILLARY: 75 mg/dL (ref 65–99)
GLUCOSE-CAPILLARY: 135 mg/dL — AB (ref 65–99)
GLUCOSE-CAPILLARY: 108 mg/dL — AB (ref 65–99)

## 2016-03-29 LAB — CULTURE, BLOOD (ROUTINE X 2)
CULTURE: NO GROWTH
Culture: NO GROWTH

## 2016-03-29 LAB — AMMONIA: Ammonia: 80 umol/L — ABNORMAL HIGH (ref 9–35)

## 2016-03-29 MED ORDER — LACTULOSE 10 GM/15ML PO SOLN: 20.0000 g | Freq: Three times a day (TID) | ORAL | Status: DC

## 2016-03-29 MED ORDER — LACTULOSE 10 GM/15ML PO SOLN
10.0000 g | Freq: Three times a day (TID) | ORAL | Status: DC
  Administered 2016-03-29 – 2016-04-01 (×9): 10 g via ORAL
  Filled 2016-03-29 (×9): qty 15

## 2016-03-29 MED ORDER — LORAZEPAM 0.5 MG PO TABS
0.5000 mg | ORAL_TABLET | Freq: Three times a day (TID) | ORAL | Status: DC
  Administered 2016-03-29 – 2016-04-01 (×9): 0.5 mg via ORAL
  Filled 2016-03-29 (×9): qty 1

## 2016-03-29 NOTE — Progress Notes (Signed)
Patient arrived to unit, oriented to unit, vitals stable, call bell within reach and no distress.   Bryce HarrisonSamantha K Zephyra Bernardi, RN

## 2016-03-29 NOTE — Progress Notes (Addendum)
[place] TEAM 1 - Stepdown/ICU TEAM  Matis Monnier  BJY:[id number] DOB: 11-Jun-1960 DOA: 03/24/2016 PCP: [name], MD    Brief Narrative:  55 y.o.M Hx Depression, COPD, EtOH abuse, EtOH induced Liver Cirrhosis on Hospice w/ persistent Ascites with weekly Paracentesis, and HTN who presented to short stay for a TIPS procedure. While there he was noted to be in acute kidney failure, and was admitted.    Significant Events: 10/27 TIPS - admit with acute kidney failure - found to have incarcerated umbilical hernia 10/28 OR for repair of umbilical hernia with incarceration  Subjective: The patient is alert and conversant.  He is oriented to person place time and situation.  He complains of feeling dizzy when standing and he complains of very unstable gait.  He denies abdominal pain chest pain shortness of breath fevers or chills.  Assessment & Plan:  Septic shock possibly due to SBP Remains on broad abx coverage - WBC climbing but pt afebrile - follow without change in treatment at this time - no localizing clinical symptoms  Incarcerated umbilical hernia S/P surgical repair - Gen Surgery following - diet per Gen Surgery   Encephalopathy multifactorial to include EtOH liver cirrhosis, infection - ammonia remains quite elevated but clinically the patient appears to be improving therefore follow without change in treatment today  Acute renal injury  No obstruction on renal ultrasound - creatinine has normalized  Recent Labs Lab 03/24/16 0816 03/25/16 0440 03/26/16 0400 03/27/16 0345 03/28/16 0207  CREATININE 1.34* 0.60* 0.46* 0.61 0.65    EtOH liver cirrhosis   Refractory ascites  s/p TIPS in IR 10/27 - f/u in 4 weeks with Dr. Deanne Coffer in outpatient IR Clinic  Chronic hyponatremia Stable at present   Hypotension Appears to be a chronic issue but possibly somewhat worse presently - likely related to third space volume loss/cirrhosis - give trial of midodrine    Thrombocytopenia due to above/splenic sequestration - follow  Coagulopathy baseline INR 1.19-1.36  Severe malnutrition in context of chronic illness  DVT prophylaxis: SCDs in setting of thrombocytopenia and coagulopathy  Code Status: FULL CODE Family Communication: no family present at time of exam  Disposition Plan: PT suggests SNF - transfer to med bed - cont PT/OT - watch ascites - watch for signs of infection - begin placement process   Consultants:  IR  PCCM Gen Surgery   Antimicrobials:  Vancomycin 10/27 - 10/30 Zosyn 10/27 >  Objective: Blood pressure 96/68, pulse 65, temperature 97.6 F (36.4 C), temperature source Oral, resp. rate (!) 21, height 6\' 1"  (1.854 m), weight 71 kg (156 lb 8.4 oz), SpO2 99 %.  Intake/Output Summary (Last 24 hours) at 03/29/16 1127 Last data filed at 03/29/16 0116  Gross per 24 hour  Intake              150 ml  Output                0 ml  Net              150 ml   Filed Weights   03/26/16 0600 03/27/16 0500 03/28/16 0500  Weight: 66.4 kg (146 lb 6.2 oz) 69.5 kg (153 lb 3.5 oz) 71 kg (156 lb 8.4 oz)    Examination: General: No acute respiratory distress Lungs: Clear to auscultation bilaterally without wheezes or crackles Cardiovascular: Regular rate and rhythm without murmur gallop or rub normal S1 and S2 Abdomen: Nontender, nondistended, soft, bowel sounds positive, no rebound, no appreciable mass  Extremities: No significant cyanosis, clubbing, or edema bilateral lower extremities  CBC:  Recent Labs Lab 03/24/16 0816 03/25/16 0440 03/26/16 0952 03/27/16 0345 03/28/16 0207  WBC 11.5* 9.7 10.0 12.8* 15.5*  NEUTROABS 8.9* 7.5 8.4*  --  11.0*  HGB 17.6* 13.2 13.0 13.1 13.3  HCT 47.8 38.7* 38.9* 39.8 40.2  MCV 88.8 92.4 93.5 95.7 96.4  PLT 143* 67* 47* 56* 72*   Basic Metabolic Panel:  Recent Labs Lab 03/24/16 0816 03/25/16 0440 03/26/16 0400 03/27/16 0345 03/28/16 0207  NA 125* 129* 131* 132* 134*  K 3.3* 2.9*  3.6 4.2 3.8  CL 83* 94* 103 101 102  CO2 26 28 23 28 28   GLUCOSE 152* 132* 158* 137* 93  BUN 9 6 <5* <5* 10  CREATININE 1.34* 0.60* 0.46* 0.61 0.65  CALCIUM 8.9 7.4* 6.8* 7.4* 7.7*  MG  --  1.5*  --   --  2.0  PHOS  --  2.3*  --   --  1.5*   GFR: Estimated Creatinine Clearance: 104.8 mL/min (by C-G formula based on SCr of 0.65 mg/dL).  Liver Function Tests:  Recent Labs Lab 03/24/16 0816 03/25/16 0440 03/26/16 0952 03/27/16 0345 03/28/16 0207  AST 107* 114* 148* 106*  --   ALT 47 33 54 48  --   ALKPHOS 200* 114 116 109  --   BILITOT 2.0* 2.8* 2.7* 1.9*  --   PROT 7.8 5.4* 5.0* 4.9*  --   ALBUMIN 3.0* 2.7* 2.3* 2.2* 2.0*    Recent Labs Lab 03/25/16 1330 03/26/16 0955 03/29/16 0350  AMMONIA 102* 99* 80*    Coagulation Profile:  Recent Labs Lab 03/24/16 0816 03/25/16 0440 03/26/16 0952 03/27/16 0345 03/28/16 0207  INR 1.47 1.88 2.09 1.74 1.76    Cardiac Enzymes:  Recent Labs Lab 03/24/16 1731 03/24/16 2350 03/25/16 0440  TROPONINI 0.04* 0.05* 0.03*    HbA1C: Hemoglobin A1C  Date/Time Value Ref Range Status  06/14/2015 10:05 AM 5.0  Final   Hgb A1c MFr Bld  Date/Time Value Ref Range Status  03/24/2016 05:10 PM 4.7 (L) 4.8 - 5.6 % Final    Comment:    (NOTE)         Pre-diabetes: 5.7 - 6.4         Diabetes: >6.4         Glycemic control for adults with diabetes: <7.0     CBG:  Recent Labs Lab 03/27/16 1650 03/28/16 0838 03/28/16 1246 03/28/16 1651 03/29/16 0846  GLUCAP 110* 83 98 84 75    Recent Results (from the past 240 hour(s))  Culture, blood (routine x 2)     Status: None (Preliminary result)   Collection Time: 03/24/16  5:36 PM  Result Value Ref Range Status   Specimen Description BLOOD RIGHT HAND  Final   Special Requests BOTTLES DRAWN AEROBIC AND ANAEROBIC 5CC  Final   Culture NO GROWTH 4 DAYS  Final   Report Status PENDING  Incomplete  Culture, blood (routine x 2)     Status: None (Preliminary result)   Collection  Time: 03/24/16  5:36 PM  Result Value Ref Range Status   Specimen Description BLOOD A-LINE  Final   Special Requests BOTTLES DRAWN AEROBIC AND ANAEROBIC 5CC  Final   Culture NO GROWTH 4 DAYS  Final   Report Status PENDING  Incomplete  Surgical PCR screen     Status: None   Collection Time: 03/27/16 10:30 AM  Result Value Ref Range Status  MRSA, PCR NEGATIVE NEGATIVE Final   Staphylococcus aureus NEGATIVE NEGATIVE Final    Comment:        The Xpert SA Assay (FDA approved for NASAL specimens in patients over 55 years of age), is one component of a comprehensive surveillance program.  Test performance has been validated by [facility] for patients greater than or equal to 55 year old. It is not intended to diagnose infection nor to guide or monitor treatment.      Scheduled Meds: . citalopram  20 mg Oral QPM  . famotidine  20 mg Oral BID  . feeding supplement  1 Container Oral TID BM  . folic acid  1 mg Oral Daily  . heparin subcutaneous  5,000 Units Subcutaneous Q8H  . lactulose  10 g Oral TID  . LORazepam  1 mg Oral TID  . piperacillin-tazobactam (ZOSYN)  IV  3.375 g Intravenous Q8H  . sodium chloride flush  3 mL Intravenous Q12H  . spironolactone  50 mg Oral Daily  . thiamine  100 mg Oral Daily     LOS: 5 days   Lonia BloodJeffrey T. Searra Carnathan, MD [facility] Office  [phone] Pager - Text Page per Amion as per below:  On-Call/Text Page:      [email or web address]      password [email or web address]  If 7PM-7AM, please contact night-coverage [email or web address] Password [email or web address] 03/29/2016, 11:27 AM

## 2016-03-29 NOTE — Progress Notes (Signed)
Referring Physician(s): Dr Maretta BeesJ McClung  Supervising Physician: Gilmer MorWagner, Jaime  [name]  Chief Complaint:  Refractory ascites TIPS 10/27 Incarcerated umbilical hernia surgery 10/28  Subjective:  Pt up in chair  No complaints No pain Urinating well; +BM Cognition back to normal  Allergies: Review of patient's allergies indicates no known allergies.  Medications: Prior to Admission medications   Medication Sig Start Date End Date Taking? Authorizing Provider  albuterol (PROVENTIL HFA;VENTOLIN HFA) 108 (90 Base) MCG/ACT inhaler Inhale 2 puffs into the lungs every 6 (six) hours as needed for wheezing or shortness of breath. 12/02/15  Yes [name], MD  citalopram (CELEXA) 20 MG tablet Take 1 tablet (20 mg total) by mouth daily. Patient taking differently: Take 20 mg by mouth every evening.  03/10/16  Yes [name], MD  diclofenac sodium (VOLTAREN) 1 % GEL Apply 4 g topically 4 (four) times daily. Patient taking differently: Apply 4 g topically 4 (four) times daily as needed (for pain).  10/21/15  Yes [name], MD  furosemide (LASIX) 20 MG tablet TAKE 1 TABLET BY MOUTH DAILY. Patient taking differently: TAKE 1 TABLET BY MOUTH DAILY IN THE AFTERNOON 03/02/16  Yes [name], MD  lactulose (CHRONULAC) 10 GM/15ML solution Take 15 mLs (10 g total) by mouth 2 (two) times daily. 07/22/15  Yes [name], MD  potassium chloride SA (K-DUR,KLOR-CON) 20 MEQ tablet Take 1 tablet (20 mEq total) by mouth daily. Patient taking differently: Take 20 mEq by mouth every evening.  03/10/16  Yes [name], MD  ranitidine (ZANTAC) 150 MG tablet Take 1 tablet (150 mg total) by mouth 2 (two) times daily. 11/22/15  Yes [name], MD  spironolactone (ALDACTONE) 50 MG tablet Take 1 tablet (50 mg total) by mouth daily. 03/10/16  Yes [name], MD     Vital Signs: BP 96/68 (BP Location: Left Arm)   Pulse 65   Temp 97.6 F (36.4 C) (Oral)   Resp (!) 21   Ht 6\' 1"   (1.854 m)   Wt 156 lb 8.4 oz (71 kg)   SpO2 99%   BMI 20.65 kg/m   Physical Exam  Pulmonary/Chest: Effort normal.  Abdominal: Soft. Bowel sounds are normal. He exhibits distension. There is no tenderness.  minimal distension  Musculoskeletal: Normal range of motion.  Neurological: He is alert.  Skin: Skin is warm and dry.  Psychiatric: He has a normal mood and affect.  Nursing note and vitals reviewed.   Imaging: No results found.  Labs:  CBC:  Recent Labs  03/25/16 0440 03/26/16 0952 03/27/16 0345 03/28/16 0207  WBC 9.7 10.0 12.8* 15.5*  HGB 13.2 13.0 13.1 13.3  HCT 38.7* 38.9* 39.8 40.2  PLT 67* 47* 56* 72*    COAGS:  Recent Labs  09/16/15 1719  02/05/16 1010 03/24/16 0816 03/25/16 0440 03/26/16 0952 03/27/16 0345 03/28/16 0207  INR 1.36  < > 1.19 1.47 1.88 2.09 1.74 1.76  APTT 37  --  35 38* 49*  --   --   --   < > = values in this interval not displayed.  BMP:  Recent Labs  03/25/16 0440 03/26/16 0400 03/27/16 0345 03/28/16 0207  NA 129* 131* 132* 134*  K 2.9* 3.6 4.2 3.8  CL 94* 103 101 102  CO2 28 23 28 28   GLUCOSE 132* 158* 137* 93  BUN 6 <5* <5* 10  CALCIUM 7.4* 6.8* 7.4* 7.7*  CREATININE 0.60* 0.46* 0.61 0.65  GFRNONAA >  60 >60 >60 >60  GFRAA >60 >60 >60 >60    LIVER FUNCTION TESTS:  Recent Labs  03/24/16 0816 03/25/16 0440 03/26/16 0952 03/27/16 0345 03/28/16 0207  BILITOT 2.0* 2.8* 2.7* 1.9*  --   AST 107* 114* 148* 106*  --   ALT 47 33 54 48  --   ALKPHOS 200* 114 116 109  --   PROT 7.8 5.4* 5.0* 4.9*  --   ALBUMIN 3.0* 2.7* 2.3* 2.2* 2.0*    Assessment and Plan:  TIPS 10/27 Incarcerated umbilical hernia surgery 10/28 Doing well Will need 4 week follow up with IR--- ordered; pt aware  Paracentesis NOT scheduled for today really---this is a standing order for OP para. If feel In pt paracentesis is warranted---will need to order procedure and labs if wanted  Electronically Signed: Laron Boorman A 03/29/2016,  11:31 AM   I spent a total of 15 Minutes at the the patient's bedside AND on the patient's hospital floor or unit, greater than 50% of which was counseling/coordinating care for TIPS

## 2016-03-29 NOTE — Progress Notes (Signed)
4 Days Post-Op  Subjective: Alert, needs to use the bathroom. Slept well.  Objective: Vital signs in last 24 hours: Temp:  [97.3 F (36.3 C)-98.5 F (36.9 C)] 98.2 F (36.8 C) (10/31 1943) Pulse Rate:  [59-79] 63 (11/01 0200) Resp:  [14-25] 21 (11/01 0200) BP: (82-113)/(58-81) 89/60 (11/01 0200) SpO2:  [96 %-100 %] 96 % (11/01 0200) Last BM Date: 03/28/16  Intake/Output from previous day: 10/31 0701 - 11/01 0700 In: 150 [IV Piggyback:150] Out: -  Intake/Output this shift: Total I/O In: 50 [IV Piggyback:50] Out: -   General appearance: alert, cooperative and no distress GI: soft, mildly distended; more ascites than yesterday Incision mostly intact; tiny bit of bloody drainage at right end of incision; no ascitic leak No sign of infection in incision  Lab Results:   Recent Labs  03/27/16 0345 03/28/16 0207  WBC 12.8* 15.5*  HGB 13.1 13.3  HCT 39.8 40.2  PLT 56* 72*   BMET  Recent Labs  03/27/16 0345 03/28/16 0207  NA 132* 134*  K 4.2 3.8  CL 101 102  CO2 28 28  GLUCOSE 137* 93  BUN <5* 10  CREATININE 0.61 0.65  CALCIUM 7.4* 7.7*   PT/INR  Recent Labs  03/27/16 0345 03/28/16 0207  LABPROT 20.5* 20.8*  INR 1.74 1.76   ABG No results for input(s): PHART, HCO3 in the last 72 hours.  Invalid input(s): PCO2, PO2  Studies/Results: No results found.  Anti-infectives: Anti-infectives    Start     Dose/Rate Route Frequency Ordered Stop   03/25/16 0800  ceFAZolin (ANCEF) IVPB 2g/100 mL premix  Status:  Discontinued     2 g 200 mL/hr over 30 Minutes Intravenous To ShortStay Surgical 03/24/16 2033 03/25/16 0404   03/25/16 0600  vancomycin (VANCOCIN) 500 mg in sodium chloride 0.9 % 100 mL IVPB  Status:  Discontinued     500 mg 100 mL/hr over 60 Minutes Intravenous Every 12 hours 03/24/16 1659 03/27/16 1357   03/24/16 1800  piperacillin-tazobactam (ZOSYN) IVPB 3.375 g     3.375 g 12.5 mL/hr over 240 Minutes Intravenous Every 8 hours 03/24/16 1655      03/24/16 1730  vancomycin (VANCOCIN) 1,250 mg in sodium chloride 0.9 % 250 mL IVPB     1,250 mg 166.7 mL/hr over 90 Minutes Intravenous To Post Anesthesia Care Unit 03/24/16 1655 03/25/16 1730   03/24/16 0815  ceFAZolin (ANCEF) IVPB 2g/100 mL premix     2 g 200 mL/hr over 30 Minutes Intravenous To Short Stay 03/24/16 0757 03/24/16 1240   03/24/16 0759  ceFAZolin (ANCEF) 2-4 GM/100ML-% IVPB    Comments:  Marrianne MoodAltman, [name]   : cabinet override      03/24/16 0759 03/24/16 1225      Assessment/Plan: Incarcerated umbilical hernia S/p Umbilical hernia repair 10/28, Dr. 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  FEN: soft  ID: Ancef one dose 10/27, Zosyn 10/28 >>, Vancomycin 10/28 >> VTE: [place] heparin, SCD's  Plan:  Continue zofran PRN for nausea, continue soft diet Stable for transfer out of unit from a surgical standpoint  Pan-culture/investigate source of WBC especially if febrile, follow up cyto/cx from paracentesis   LOS: 5 days    Berna BueChelsea A Kathlean Cinco 03/29/2016

## 2016-03-29 NOTE — Care Management Note (Addendum)
Case Management Note  Patient Details  Name: [name] MRN: [id number] Date of Birth: 13-Oct-1960  Subjective/Objective:     Acute Kidney Failure, TIPS 03/24/2016, 10/28 OR for repair of umbilical hernia with incarceration               Action/Plan: Discharge Planning:  Chart reviewed. NCM spoke to pt and states he was active with [facility] prior to admission. States he lives at home with brother, [name]. He has RW. Pt agreeable to SNF rehab. CSW referral for SNF placement. Pt has Medicaid. Home with Hospice vs Residential Hospice.    Expected Discharge Date:                 Expected Discharge Plan:  Skilled Nursing Facility  In-House Referral:  Clinical Social Work  Discharge planning Services  CM Consult  Post Acute Care Choice:  NA Choice offered to:  NA  DME Arranged:  N/A DME Agency:  NA  HH Arranged:  NA HH Agency:  NA  Status of Service:  Completed, signed off  If discussed at Long Length of Stay Meetings, dates discussed:    Additional Comments:  [name], Bryce Creasy Ellen, RN 03/29/2016, 6:01 PM

## 2016-03-30 DIAGNOSIS — N170 Acute kidney failure with tubular necrosis: Principal | ICD-10-CM

## 2016-03-30 DIAGNOSIS — K420 Umbilical hernia with obstruction, without gangrene: Secondary | ICD-10-CM

## 2016-03-30 DIAGNOSIS — E43 Unspecified severe protein-calorie malnutrition: Secondary | ICD-10-CM

## 2016-03-30 LAB — COMPREHENSIVE METABOLIC PANEL
ALK PHOS: 86 U/L (ref 38–126)
ALT: 29 U/L (ref 17–63)
AST: 44 U/L — AB (ref 15–41)
Albumin: 1.9 g/dL — ABNORMAL LOW (ref 3.5–5.0)
Anion gap: 5 (ref 5–15)
BUN: 8 mg/dL (ref 6–20)
CALCIUM: 7.7 mg/dL — AB (ref 8.9–10.3)
CO2: 27 mmol/L (ref 22–32)
CREATININE: 0.6 mg/dL — AB (ref 0.61–1.24)
Chloride: 103 mmol/L (ref 101–111)
Glucose, Bld: 89 mg/dL (ref 65–99)
Potassium: 3.4 mmol/L — ABNORMAL LOW (ref 3.5–5.1)
Sodium: 135 mmol/L (ref 135–145)
Total Bilirubin: 2.3 mg/dL — ABNORMAL HIGH (ref 0.3–1.2)
Total Protein: 4.4 g/dL — ABNORMAL LOW (ref 6.5–8.1)

## 2016-03-30 LAB — CBC
HCT: 39 % (ref 39.0–52.0)
Hemoglobin: 13.1 g/dL (ref 13.0–17.0)
MCH: 31.9 pg (ref 26.0–34.0)
MCHC: 33.6 g/dL (ref 30.0–36.0)
MCV: 94.9 fL (ref 78.0–100.0)
PLATELETS: 69 10*3/uL — AB (ref 150–400)
RBC: 4.11 MIL/uL — AB (ref 4.22–5.81)
RDW: 15.3 % (ref 11.5–15.5)
WBC: 9.5 10*3/uL (ref 4.0–10.5)

## 2016-03-30 LAB — PHOSPHORUS: PHOSPHORUS: 3.3 mg/dL (ref 2.5–4.6)

## 2016-03-30 LAB — MAGNESIUM: Magnesium: 1.6 mg/dL — ABNORMAL LOW (ref 1.7–2.4)

## 2016-03-30 MED ORDER — GI COCKTAIL ~~LOC~~
30.0000 mL | Freq: Once | ORAL | Status: AC
  Administered 2016-03-30: 30 mL via ORAL
  Filled 2016-03-30: qty 30

## 2016-03-30 MED ORDER — ENSURE ENLIVE PO LIQD
237.0000 mL | Freq: Three times a day (TID) | ORAL | Status: DC
  Administered 2016-03-31 – 2016-04-01 (×4): 237 mL via ORAL

## 2016-03-30 NOTE — Progress Notes (Signed)
PROGRESS NOTE        PATIENT DETAILS Name: [name] Age: 55 y.o. Sex: male Date of Birth: [birth year] Admit Date: 03/24/2016 Admitting Physician Roslynn Amble, MD ZOX:[id number], Venetia Night, MD  Brief Narrative: 55 y.o.MHxDepression, COPD, EtOH abuse,EtOH induced Liver Cirrhosis previously on Hospice w/ persistent Ascites with weekly Paracentesis, and HTN who presented to short stay for a TIPS procedure. While there he was noted to be in acute kidney failure, and was admitted.   upon further evaluation, he was found to have a incarcerated umbilical hernia, and was evaluated by general surgery following which he underwent repair of the umbilical hernia on 10/28. Post operatively, patient patient remain on the ventilator and was in his intensive care unit. Once he was extubated, he was transferred to the [facility] hospitalist service on 10/31.  Subjective: Main complaint is weakness.  Assessment/Plan: Septic Shock: Resolved, leukocytosis has now normalized.Etiology uncertain at this time-but potentially could have spontaneous bacterial peritonitis given ascites-however no paracentesis done to confirm at this time. He remains on empiric Zosyn at this time. Blood cultures remain negative. Will discuss with either ID or GI regarding transitioning to oral antibiotics.  Acute hypoxic Respiratory Failure:  Resolved. Extubated 10/28. Continue incentive spirometry with pulmonary toilette.   Incarcerated umbilical hernia:S/P surgical repair - Gen Surgery following - diet per Gen Surgery   Acute renal failure: Suspect prerenal azotemia-resolved with supportive measures.  Acute encephalopathy: Resolved-thought to be multifactorial  to include EtOH liver cirrhosis, infection, mildly elevated ammonia levels. He remains on lactulose. Follow  History of liver cirrhosis with refractory ascites: Underwent TIPS procedure on 10/27-plans are to follow up with Dr. Raeanne Barry outpatient  [facility] in 4 weeks. Continue Aldactone, when blood pressure more stable will add Lasix.  Mild hyponatremia: Probably secondary to liver cirrhosis-follow for now. Plans are to slowly resume Lasix over the next few days, restrict sodium in diet.  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  Deconditioning: Secondary to acute illness, PT evaluation completed-recommendations are for SNF  DVT Prophylaxis:  SCD's  Code Status: Full code  Family Communication: None at bedside  Disposition Plan: Remain inpatient- SNF on discharge  Antimicrobial agents: See below  Procedures: repair of the umbilical hernia on 10/28  CONSULTS:  pulmonary/intensive care and IR  Time spent: 25- minutes-Greater than 50% of this time was spent in counseling, explanation of diagnosis, planning of further management, and coordination of care.  MEDICATIONS: Anti-infectives    Start     Dose/Rate Route Frequency Ordered Stop   03/25/16 0800  ceFAZolin (ANCEF) IVPB 2g/100 mL premix  Status:  Discontinued     2 g 200 mL/hr over 30 Minutes Intravenous To ShortStay Surgical 03/24/16 2033 03/25/16 0404   03/25/16 0600  vancomycin (VANCOCIN) 500 mg in sodium chloride 0.9 % 100 mL IVPB  Status:  Discontinued     500 mg 100 mL/hr over 60 Minutes Intravenous Every 12 hours 03/24/16 1659 03/27/16 1357   03/24/16 1800  piperacillin-tazobactam (ZOSYN) IVPB 3.375 g     3.375 g 12.5 mL/hr over 240 Minutes Intravenous Every 8 hours 03/24/16 1655     03/24/16 1730  vancomycin (  VANCOCIN) 1,250 mg in sodium chloride 0.9 % 250 mL IVPB     1,250 mg 166.7 mL/hr over 90 Minutes Intravenous To Post Anesthesia Care Unit 03/24/16 1655  03/25/16 1730   03/24/16 0815  ceFAZolin (ANCEF) IVPB 2g/100 mL premix     2 g 200 mL/hr over 30 Minutes Intravenous To Short Stay 03/24/16 0757 03/24/16 1240   03/24/16 0759  ceFAZolin (ANCEF) 2-4 GM/100ML-% IVPB    Comments:  Marrianne Mood   : cabinet override      03/24/16 0759 03/24/16 1225      Scheduled Meds: . citalopram  20 mg Oral QPM  . famotidine  20 mg Oral BID  . feeding supplement  1 Container Oral TID BM  . folic acid  1 mg Oral Daily  . lactulose  10 g Oral TID  . LORazepam  0.5 mg Oral TID  . piperacillin-tazobactam (ZOSYN)  IV  3.375 g Intravenous Q8H  . sodium chloride flush  3 mL Intravenous Q12H  . spironolactone  50 mg Oral Daily  . thiamine  100 mg Oral Daily   Continuous Infusions:  PRN Meds:.albuterol, ondansetron **OR** ondansetron (ZOFRAN) IV   PHYSICAL EXAM: Vital signs: Vitals:   03/29/16 2049 03/30/16 0503 03/30/16 0900 03/30/16 1359  BP: (!) 86/46 96/62 (!) 93/57 (!) 84/53  Pulse: 78 71 70 79  Resp: 18 17  16   Temp: 98.5 F (36.9 C) 98.3 F (36.8 C) 98.3 F (36.8 C) 98.8 F (37.1 C)  TempSrc: Oral Oral Oral Oral  SpO2: 96% 97% 95% 96%  Weight:      Height:       Filed Weights   03/27/16 0500 03/28/16 0500 03/29/16 1819  Weight: 69.5 kg (153 lb 3.5 oz) 71 kg (156 lb 8.4 oz) 73.5 kg (162 lb 1.6 oz)   Body mass index is 21.39 kg/m.   General appearance :Awake, alert,looks frail  Eyes:, pupils equally reactive to light and accomodation,no scleral icterus.Pink conjunctiva HEENT: Atraumatic and Normocephalic Neck: supple, no JVD. No cervical lymphadenopathy. No thyromegaly Resp:Good air entry bilaterally, no added sounds  CVS: S1 S2 regular, no murmurs.  GI: Bowel sounds present, midline dressing present-appropriately tender, not tense.  Extremities: B/L Lower Ext shows no edema, both legs are warm to touch Neurology:  speech clear,Non focal, sensation is grossly intact. Psychiatric: Normal judgment and insight. Alert and oriented  x 3.  Musculoskeletal:No digital cyanosis Skin:No Rash, warm and dry Wounds:N/A  I have personally reviewed following labs and imaging studies  LABORATORY DATA: CBC:  Recent Labs Lab 03/24/16 0816 03/25/16 0440 03/26/16 0952 03/27/16 0345 03/28/16 0207 03/30/16 0725  WBC 11.5* 9.7 10.0 12.8* 15.5* 9.5  NEUTROABS 8.9* 7.5 8.4*  --  11.0*  --   HGB 17.6* 13.2 13.0 13.1 13.3 13.1  HCT 47.8 38.7* 38.9* 39.8 40.2 39.0  MCV 88.8 92.4 93.5 95.7 96.4 94.9  PLT 143* 67* 47* 56* 72* 69*    Basic Metabolic Panel:  Recent Labs Lab 03/25/16 0440 03/26/16 0400 03/27/16 0345 03/28/16 0207 03/30/16 0725  NA 129* 131* 132* 134* 135  K 2.9* 3.6 4.2 3.8 3.4*  CL 94* 103 101 102 103  CO2 28 23 28 28 27   GLUCOSE 132* 158* 137* 93 89  BUN 6 <5* <5* 10 8  CREATININE 0.60* 0.46* 0.61 0.65 0.60*  CALCIUM 7.4* 6.8* 7.4* 7.7* 7.7*  MG 1.5*  --   --  2.0 1.6*  PHOS 2.3*  --   --  1.5*  3.3    GFR: Estimated Creatinine Clearance: 108.5 mL/min (by C-G formula based on SCr of 0.6 mg/dL (L)).  Liver Function Tests:  Recent Labs Lab 03/24/16 0816 03/25/16 0440 03/26/16 0952 03/27/16 0345 03/28/16 0207 03/30/16 0725  AST 107* 114* 148* 106*  --  44*  ALT 47 33 54 48  --  29  ALKPHOS 200* 114 116 109  --  86  BILITOT 2.0* 2.8* 2.7* 1.9*  --  2.3*  PROT 7.8 5.4* 5.0* 4.9*  --  4.4*  ALBUMIN 3.0* 2.7* 2.3* 2.2* 2.0* 1.9*   No results for input(s): LIPASE, AMYLASE in the last 168 hours.  Recent Labs Lab 03/25/16 1330 03/26/16 0955 03/29/16 0350  AMMONIA 102* 99* 80*    Coagulation Profile:  Recent Labs Lab 03/24/16 0816 03/25/16 0440 03/26/16 0952 03/27/16 0345 03/28/16 0207  INR 1.47 1.88 2.09 1.74 1.76    Cardiac Enzymes:  Recent Labs Lab 03/24/16 1731 03/24/16 2350 03/25/16 0440  TROPONINI 0.04* 0.05* 0.03*    BNP (last 3 results) No results for input(s): PROBNP in the last 8760 hours.  HbA1C: No results for input(s): HGBA1C in the last 72  hours.  CBG:  Recent Labs Lab 03/28/16 1246 03/28/16 1651 03/29/16 0846 03/29/16 1136 03/29/16 1619  GLUCAP 98 84 75 135* 108*    Lipid Profile: No results for input(s): CHOL, HDL, LDLCALC, TRIG, CHOLHDL, LDLDIRECT in the last 72 hours.  Thyroid Function Tests: No results for input(s): TSH, T4TOTAL, FREET4, T3FREE, THYROIDAB in the last 72 hours.  Anemia Panel: No results for input(s): VITAMINB12, FOLATE, FERRITIN, TIBC, IRON, RETICCTPCT in the last 72 hours.  Urine analysis:    Component Value Date/Time   COLORURINE AMBER (A) 09/06/2015 0714   APPEARANCEUR CLEAR 09/06/2015 0714   LABSPEC 1.017 09/06/2015 0714   PHURINE 6.0 09/06/2015 0714   GLUCOSEU NEGATIVE 09/06/2015 0714   HGBUR TRACE (A) 09/06/2015 0714   BILIRUBINUR SMALL (A) 09/06/2015 0714   KETONESUR 15 (A) 09/06/2015 0714   PROTEINUR NEGATIVE 09/06/2015 0714   UROBILINOGEN 0.2 04/11/2015 0800   NITRITE POSITIVE (A) 09/06/2015 0714   LEUKOCYTESUR SMALL (A) 09/06/2015 0714    Sepsis Labs: Lactic Acid, Venous    Component Value Date/Time   LATICACIDVEN 2.1 (HH) 03/25/2016 0900    MICROBIOLOGY: Recent Results (from the past 240 hour(s))  Culture, blood (routine x 2)     Status: None   Collection Time: 03/24/16  5:36 PM  Result Value Ref Range Status   Specimen Description BLOOD RIGHT HAND  Final   Special Requests BOTTLES DRAWN AEROBIC AND ANAEROBIC 5CC  Final   Culture NO GROWTH 5 DAYS  Final   Report Status 03/29/2016 FINAL  Final  Culture, blood (routine x 2)     Status: None   Collection Time: 03/24/16  5:36 PM  Result Value Ref Range Status   Specimen Description BLOOD A-LINE  Final   Special Requests BOTTLES DRAWN AEROBIC AND ANAEROBIC 5CC  Final   Culture NO GROWTH 5 DAYS  Final   Report Status 03/29/2016 FINAL  Final  Surgical PCR screen     Status: None   Collection Time: 03/27/16 10:30 AM  Result Value Ref Range Status   MRSA, PCR NEGATIVE NEGATIVE Final   Staphylococcus aureus  NEGATIVE NEGATIVE Final    Comment:        The Xpert SA Assay (FDA approved for NASAL specimens in patients over 3 years of age), is one component of a comprehensive surveillance program.  Test performance has been validated by [facility] for patients greater than or equal to 53 year old. It is not intended to diagnose infection nor to guide or monitor treatment.     RADIOLOGY STUDIES/RESULTS: Ct Abdomen Pelvis Wo Contrast  Addendum Date: 03/24/2016   ADDENDUM REPORT: 03/24/2016 16:37 ADDENDUM: These results were called by telephone at the time of interpretation on 03/24/2016 at 4:20 pm to Dr. Claud Kelp, who verbally acknowledged these results. Electronically Signed   By: Carey Bullocks M.D.   On: 03/24/2016 16:37   Result Date: 03/24/2016 CLINICAL DATA:  Incarcerated umbilical hernia. Left lung nodule. Post TIPS procedure today. EXAM: CT CHEST, ABDOMEN AND PELVIS WITHOUT CONTRAST TECHNIQUE: Multidetector CT imaging of the chest, abdomen and pelvis was performed following the standard protocol without IV contrast. COMPARISON:  Chest radiographs 03/24/2016. FINDINGS: CT CHEST FINDINGS Cardiovascular: Atherosclerosis of the aorta, great vessels and coronary arteries. Right IJ central venous catheter extends into the superior aspect of the right atrium. The heart size is normal. There is no pericardial effusion. Mediastinum/Nodes: There are no enlarged mediastinal, hilar or axillary lymph nodes. Endotracheal and nasogastric tubes are in place. There is possible wall thickening of the distal esophagus. The thyroid gland and trachea demonstrate no significant findings. Lungs/Pleura: There is no pleural effusion. Mild emphysema. There is a small calcified granuloma in the lingula on image 70 which may account for the radiographic finding. No suspicious pulmonary nodules. There is no confluent airspace opacity or pneumothorax. Musculoskeletal/Chest wall: Bilateral gynecomastia noted. Old rib  fractures are present bilaterally. Fractures of the left eighth and ninth ribs may be incompletely healed. There is a fracture of the T12 vertebral body with associated mild osseous retropulsion. This is not clearly seen on chest radiographs from April and could be subacute in age. CT ABDOMEN AND PELVIS FINDINGS Hepatobiliary: Diffuse hepatic steatosis. No focal hepatic abnormalities are seen on noncontrast imaging. There is high-density material layering within the gallbladder lumen. This is likely due to a combination of gallstones and vicarious excretion of contrast. No gallbladder wall thickening or significant biliary dilatation seen. Pancreas: Unremarkable. No pancreatic ductal dilatation or surrounding inflammatory changes. Spleen: Normal in size without focal abnormality. Adrenals/Urinary Tract: Both adrenal glands appear normal. There is contrast material within the renal collecting systems bilaterally, attributed to previous interventional procedure. Low-density renal lesions are present bilaterally, likely cysts. One in the lower pole of the left kidney measures 3.1 cm and 21 HU. No evidence of hydronephrosis. Bladder is partially decompressed by a Foley catheter. There is bladder wall thickening. Stomach/Bowel: The stomach is decompressed by a nasogastric tube. The proximal small bowel is fluid filled with scattered air-fluid levels and mild dilatation. There is an umbilical hernia containing small bowel, measuring up to 7.0 x 8.9 cm transverse. The distal small bowel is decompressed. No colonic abnormalities are seen. There is a small periampullary duodenal diverticulum. Vascular/Lymphatic: There are no enlarged abdominal or pelvic lymph nodes. Diffuse atherosclerosis of the aorta and its branches. TIPS extends from the portal vein to the junction of the IVC with the right hepatic vein. Patency not addressed by this examination. No evidence of surrounding hematoma. Probable upper abdominal collateral  vessels/varices. Reproductive: Mild enlargement of the prostate gland which demonstrates dystrophic calcifications. The seminal vesicles appear unremarkable. Other: As above, umbilical hernia containing small bowel. There is a moderate amount of ascites, with most of the fluid in the pelvis. Musculoskeletal: No acute or significant osseous findings. IMPRESSION: 1. No suspicious pulmonary nodule. There  is a small calcified granuloma in the lingula. 2. Umbilical hernia containing fluid-filled small bowel. There is proximal small bowel dilatation with air-fluid levels, supporting the presence of an incarcerated hernia. 3. Moderate ascites, attributed to underlying liver disease. 4. T12 superior endplate compression fracture with osseous retropulsion, potentially subacute. 5. Indeterminate 3.1 cm left renal lesion, measuring higher than water density. Statistically, this is a complex cyst. 6. Additional incidental findings including emphysema, bilateral rib fractures, hepatic steatosis, diffuse atherosclerosis and bladder wall thickening. Electronically Signed: By: Carey BullocksWilliam  Veazey M.D. On: 03/24/2016 16:07   Dg Chest 2 View  Result Date: 03/24/2016 CLINICAL DATA:  Pre op today for TIPS,copd,liver dz,htn,,weakness today EXAM: CHEST  2 VIEW COMPARISON:  09/05/2015 FINDINGS: Lungs are hyperinflated. Heart size is normal. No focal consolidations or pleural effusions are identified. Overlying the left mid lung zone there is a 9 mm nodule. Remote rib fractures. IMPRESSION: 1. 9 mm nodule overlying the left mid lung zone. As needed, further characterization can be performed with CT of the chest. Intravenous contrast is recommended unless it is contraindicated. 2. No acute consolidation. These results will be called to the ordering clinician or representative by the Radiologist Assistant, and communication documented in the PACS or zVision Dashboard. Electronically Signed   By: Norva PavlovElizabeth  Brown M.D.   On: 03/24/2016 08:51     Ct Chest Wo Contrast  Addendum Date: 03/24/2016   ADDENDUM REPORT: 03/24/2016 16:37 ADDENDUM: These results were called by telephone at the time of interpretation on 03/24/2016 at 4:20 pm to Dr. Claud KelpHaywood Ingram, who verbally acknowledged these results. Electronically Signed   By: Carey BullocksWilliam  Veazey M.D.   On: 03/24/2016 16:37   Result Date: 03/24/2016 CLINICAL DATA:  Incarcerated umbilical hernia. Left lung nodule. Post TIPS procedure today. EXAM: CT CHEST, ABDOMEN AND PELVIS WITHOUT CONTRAST TECHNIQUE: Multidetector CT imaging of the chest, abdomen and pelvis was performed following the standard protocol without IV contrast. COMPARISON:  Chest radiographs 03/24/2016. FINDINGS: CT CHEST FINDINGS Cardiovascular: Atherosclerosis of the aorta, great vessels and coronary arteries. Right IJ central venous catheter extends into the superior aspect of the right atrium. The heart size is normal. There is no pericardial effusion. Mediastinum/Nodes: There are no enlarged mediastinal, hilar or axillary lymph nodes. Endotracheal and nasogastric tubes are in place. There is possible wall thickening of the distal esophagus. The thyroid gland and trachea demonstrate no significant findings. Lungs/Pleura: There is no pleural effusion. Mild emphysema. There is a small calcified granuloma in the lingula on image 70 which may account for the radiographic finding. No suspicious pulmonary nodules. There is no confluent airspace opacity or pneumothorax. Musculoskeletal/Chest wall: Bilateral gynecomastia noted. Old rib fractures are present bilaterally. Fractures of the left eighth and ninth ribs may be incompletely healed. There is a fracture of the T12 vertebral body with associated mild osseous retropulsion. This is not clearly seen on chest radiographs from April and could be subacute in age. CT ABDOMEN AND PELVIS FINDINGS Hepatobiliary: Diffuse hepatic steatosis. No focal hepatic abnormalities are seen on noncontrast imaging.  There is high-density material layering within the gallbladder lumen. This is likely due to a combination of gallstones and vicarious excretion of contrast. No gallbladder wall thickening or significant biliary dilatation seen. Pancreas: Unremarkable. No pancreatic ductal dilatation or surrounding inflammatory changes. Spleen: Normal in size without focal abnormality. Adrenals/Urinary Tract: Both adrenal glands appear normal. There is contrast material within the renal collecting systems bilaterally, attributed to previous interventional procedure. Low-density renal lesions are present bilaterally, likely cysts. One  in the lower pole of the left kidney measures 3.1 cm and 21 HU. No evidence of hydronephrosis. Bladder is partially decompressed by a Foley catheter. There is bladder wall thickening. Stomach/Bowel: The stomach is decompressed by a nasogastric tube. The proximal small bowel is fluid filled with scattered air-fluid levels and mild dilatation. There is an umbilical hernia containing small bowel, measuring up to 7.0 x 8.9 cm transverse. The distal small bowel is decompressed. No colonic abnormalities are seen. There is a small periampullary duodenal diverticulum. Vascular/Lymphatic: There are no enlarged abdominal or pelvic lymph nodes. Diffuse atherosclerosis of the aorta and its branches. TIPS extends from the portal vein to the junction of the IVC with the right hepatic vein. Patency not addressed by this examination. No evidence of surrounding hematoma. Probable upper abdominal collateral vessels/varices. Reproductive: Mild enlargement of the prostate gland which demonstrates dystrophic calcifications. The seminal vesicles appear unremarkable. Other: As above, umbilical hernia containing small bowel. There is a moderate amount of ascites, with most of the fluid in the pelvis. Musculoskeletal: No acute or significant osseous findings. IMPRESSION: 1. No suspicious pulmonary nodule. There is a small  calcified granuloma in the lingula. 2. Umbilical hernia containing fluid-filled small bowel. There is proximal small bowel dilatation with air-fluid levels, supporting the presence of an incarcerated hernia. 3. Moderate ascites, attributed to underlying liver disease. 4. T12 superior endplate compression fracture with osseous retropulsion, potentially subacute. 5. Indeterminate 3.1 cm left renal lesion, measuring higher than water density. Statistically, this is a complex cyst. 6. Additional incidental findings including emphysema, bilateral rib fractures, hepatic steatosis, diffuse atherosclerosis and bladder wall thickening. Electronically Signed: By: Carey Bullocks M.D. On: 03/24/2016 16:07   US Renal  Result Date: 03/25/2016 CLINICAL DATA:  Urinary retention.  Cirrhosis of liver. EXAM: RENAL / URINARY TRACT ULTRASOUND COMPLETE COMPARISON:  Ultrasound 03/22/2016. FINDINGS: Right Kidney: Length: 11.5 cm. Echogenicity within normal limits. No mass or hydronephrosis visualized. Left Kidney: Length: 13.0 cm. 3 cm left lower pole renal cyst. Echogenicity within normal limits. No mass or hydronephrosis visualized. Bladder: The urinary bladder empty.  Foley catheter in the bladder Mild ascites. IMPRESSION: No renal obstruction.  Urinary bladder empty with Foley catheter Mild ascites, significantly improved from ultrasound of 03/22/2016 Electronically Signed   By: Marlan Palau M.D.   On: 03/25/2016 07:54   US Paracentesis  Result Date: 03/22/2016 INDICATION: History of alcoholic cirrhosis with recurrent ascites. Request for therapeutic paracentesis EXAM: ULTRASOUND GUIDED RIGHT LATERAL ABDOMEN PARACENTESIS MEDICATIONS: 1% Lidocaine. COMPLICATIONS: None immediate. PROCEDURE: Informed written consent was obtained from the patient after a discussion of the risks, benefits and alternatives to treatment. A timeout was performed prior to the initiation of the procedure. Initial ultrasound scanning demonstrates a  large amount of ascites within the right lateral abdomen. The right lateral abdomen was prepped and draped in the usual sterile fashion. 1% lidocaine with epinephrine was used for local anesthesia. Following this, a 19 gauge, 7-cm, Yueh catheter was introduced. An ultrasound image was saved for documentation purposes. The paracentesis was performed. The catheter was removed and a dressing was applied. The patient tolerated the procedure well without immediate post procedural complication. FINDINGS: A total of approximately 2.4 liters of amber fluid was removed. IMPRESSION: Successful ultrasound-guided paracentesis yielding 2.4 liters of peritoneal fluid. Read by:  Corrin Parker, PA-C Electronically Signed   By: Jolaine Click M.D.   On: 03/22/2016 15:18   US Paracentesis  Result Date: 03/13/2016 INDICATION: Current ascites secondary to alcoholic cirrhosis. Request is  made for therapeutic paracentesis with a limit of 4 L and albumin postprocedure. EXAM: ULTRASOUND GUIDED THERAPEUTIC PARACENTESIS MEDICATIONS: 1% lidocaine COMPLICATIONS: None immediate. PROCEDURE: Informed written consent was obtained from the patient after a discussion of the risks, benefits and alternatives to treatment. A timeout was performed prior to the initiation of the procedure. Initial ultrasound scanning demonstrates a large amount of ascites within the right lower abdominal quadrant. The right lower abdomen was prepped and draped in the usual sterile fashion. 1% lidocaine was used for local anesthesia. Following this, a 19 gauge, 7-cm, Yueh catheter was introduced. An ultrasound image was saved for documentation purposes. The paracentesis was performed. The catheter was removed and a dressing was applied. The patient tolerated the procedure well without immediate post procedural complication. FINDINGS: A total of approximately 4 L of yellow fluid was removed. IMPRESSION: Successful ultrasound-guided paracentesis yielding 4 liters of  peritoneal fluid. Read by: Barnetta Chapel, PA-C Electronically Signed   By: Irish Lack M.D.   On: 03/13/2016 11:16   US Paracentesis  Result Date: 03/07/2016 INDICATION: Recurrent ascites EXAM: ULTRASOUND-GUIDED PARACENTESIS COMPARISON:  Previous paracentesis MEDICATIONS: 10 cc 1% lidocaine COMPLICATIONS: None immediate. TECHNIQUE: Informed written consent was obtained from the patient after a discussion of the risks, benefits and alternatives to treatment. A timeout was performed prior to the initiation of the procedure. Initial ultrasound scanning demonstrates a large amount of ascites within the right lower abdominal quadrant. The right lower abdomen was prepped and draped in the usual sterile fashion. 1% lidocaine with epinephrine was used for local anesthesia. Under direct ultrasound guidance, a 19 gauge, 7-cm, Yueh catheter was introduced. An ultrasound image was saved for documentation purposed. The paracentesis was performed. The catheter was removed and a dressing was applied. The patient tolerated the procedure well without immediate post procedural complication. FINDINGS: A total of approximately 4 liters of amber fluid was removed. 4 Liter max per MD. Distended bladder again noted on limited US Abdomen; pt is asymptomatic IMPRESSION: Successful ultrasound-guided paracentesis yielding 4 liters of peritoneal fluid. Read by Robet Leu [facility] Electronically Signed   By: Corlis Leak M.D.   On: 03/07/2016 15:10   US Paracentesis  Result Date: 02/29/2016 INDICATION: History of alcoholic cirrhosis with recurrent ascites. Request is made for therapeutic paracentesis. Maximum is given of 4 L. EXAM: ULTRASOUND GUIDED THERAPEUTIC PARACENTESIS MEDICATIONS: 1% lidocaine COMPLICATIONS: None immediate. PROCEDURE: Informed written consent was obtained from the patient after a discussion of the risks, benefits and alternatives to treatment. A timeout was performed prior to the initiation of the procedure.  Initial ultrasound scanning demonstrates a large amount of ascites within the right mid abdominal quadrant. The right mid abdomen was prepped and draped in the usual sterile fashion. 1% lidocaine was used for local anesthesia. Following this, a 19 gauge, 7-cm, Yueh catheter was introduced. An ultrasound image was saved for documentation purposes. The paracentesis was performed. The catheter was removed and a dressing was applied. The patient tolerated the procedure well without immediate post procedural complication. FINDINGS: A total of approximately 4.1 L of yellow fluid was removed. The patient also had a very distended bladder. IMPRESSION: Successful ultrasound-guided paracentesis yielding 4.1 liters of peritoneal fluid. The patient's primary care physician was contacted about his bladder distention. She has advised for an in and out catheter to be placed. He is going to the short-stay Center for albumin and this will be done while he is in short-stay. Read by: Barnetta Chapel, PA-C Electronically Signed   By:  Gilmer Mor D.O.   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 Recurrent large volume ascites requiring nearly weekly paracentesis. See previous consultation. EXAM: TRANSJUGULAR INTRAHEPATIC PORTOSYSTEMIC SHUNT MEDICATIONS: As antibiotic prophylaxis, cefazolin 2 g was ordered pre-procedure and administered intravenously within one hour of incision. ANESTHESIA/SEDATION: General - as administered by the Anesthesia department CONTRAST:  80 mL Isovue FLUOROSCOPY TIME:  7 minutes 18 seconds, 895 mGy COMPLICATIONS: None immediate. PROCEDURE: Informed written consent was obtained from the patient after a thorough discussion of the procedural risks, benefits and alternatives. All questions were addressed. Maximal Sterile Barrier Technique was utilized including caps, mask, sterile gowns, sterile gloves, sterile drape, hand hygiene and skin  antiseptic. A timeout was performed prior to the initiation of the procedure. Under ultrasound guidance, a 21 gauge micropuncture needle was advanced percutaneously into a right portal vein branch. A 018 guidewire was advanced into the main portal vein. 3French micro dilator was used for contrast injection to confirm portal venous positioning. A 5 French micropuncture dilator was placed with Touhy attachment. Confirmatory portal venography performed. Under ultrasound guidance, the right IJ vein was localized. The vessel was accessed with a 21-gauge micropuncture needle, exchanged over a 018 guidewire for a transitional dilator, through which a 035 guidewire was advanced. After tract dilatation, a 10 French curved vascular sheath was placed, advanced into the IVC. Through this, a 5 French angled angiographic catheter was advanced and used to catheterize the right hepatic vein. Confirmatory right hepatic venography performed. Portal and hepatic venous pressure measurements were obtained, demonstrating mean portosystemic gradient. The 018 guidewire was positioned with its transition point at the confluence of anterior and posterior right portal vein branches as a target. The Colapinto needle and sheath were advanced through the primary sheath, and the needle advanced into the right portal vein branch under biplane fluoroscopy guidance. After confirmatory contrast injection, an angled stiff glidewire was advanced into the splenic vein. The needle was exchanged for the 5 French angiographic catheter, advanced into the splenic vein for confirmatory portal venography. This demonstrated multiple esophageal varices and slow antegrade flow through the portal venous system. Measuring pigtail catheter advanced for planning portal venogram. This was exchanged for a a 7 mm Mustang angioplasty catheter used to dilate the parenchymal tract. This allowed advancement of the 10 French sheath into the portal vein. The 10 mm x 8  cm ViaTorr stent was advanced and deployed from right portal vein to right hepatic vein. The stent was dilated with a 10 mm angioplasty balloon. Follow-up pigtail catheter portal venogram demonstrates brisk antegrade flow through the TIPS, much less filling of a variceal channels, and minimal antegrade flow into intrahepatic portal vein branches. There is a nonocclusive mural thrombus near the portosplenic confluence. Pressure measurements reveal a 6 mm Hg portosystemic gradient. The pigtail catheter was withdrawn. The sheath was exchanged for a 25 cm 10 French sheath left with the tip at the SVC/RA junction as venous access. The sheath was secured externally with 0 Prolene suture and placed to a saline flush. The patient tolerated the procedure well. He was transferred to CT for previously planned imaging. IMPRESSION: 1. 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 Outpatient follow-up with a TIPS ultrasound in 1 month. Electronically Signed   By: Corlis Leak M.D.   On:  03/24/2016 16:37   Dg Chest Port 1 View  Result Date: 03/24/2016 CLINICAL DATA:  Postop, line placement EXAM: PORTABLE CHEST 1 VIEW COMPARISON:  03/24/2016 FINDINGS: Endotracheal tube is 5 cm above the carina. NG tube enters the stomach. Right internal jugular vascular sheath in place with the tip in the upper right atrium. No pneumothorax. Heart is normal size. No confluent airspace opacities or effusions. No acute bony abnormality. Old left rib fractures again noted, unchanged. IMPRESSION: No active disease. Electronically Signed   By: Charlett NoseKevin  Dover M.D.   On: 03/24/2016 16:48     LOS: 6 days   Jeoffrey MassedGHIMIRE,Alverda Nazzaro, MD  [facility] Pager:[phone]  If 7PM-7AM, please contact night-coverage [email or web address] Password [email or web address] 03/30/2016, 2:13 PM

## 2016-03-30 NOTE — Progress Notes (Signed)
[facility] Progress Note  5 Days Post-Op  Subjective: More alert today. NAE overnight. Denies abdominal pain but does report abdominal discomfort. Denies CP. Some SOB with activity. Denies purulent drainage from abdominal incision site. Tolerating PO. +BMs, 2-3 loose stools daily. Afraid to ambulate due to fall concerns.  Objective: Vital signs in last 24 hours: Temp:  [97.6 F (36.4 C)-98.5 F (36.9 C)] 98.3 F (36.8 C) (11/02 0503) Pulse Rate:  [58-89] 71 (11/02 0503) Resp:  [17-29] 17 (11/02 0503) BP: (86-103)/(46-78) 96/62 (11/02 0503) SpO2:  [95 %-100 %] 97 % (11/02 0503) Weight:  [162 lb 1.6 oz (73.5 kg)] 162 lb 1.6 oz (73.5 kg) (11/01 1819) Last BM Date: 03/28/16  Intake/Output from previous day: 11/01 0701 - 11/02 0700 In: 620 [P.O.:470; IV Piggyback:150] Out: 120 [Urine:120] Intake/Output this shift: No intake/output data recorded.   PE: Gen:  Alert, NAD, cooperative Pulm:  CTA, no W/R/R Abd: Soft, mild distention, Incision in tact - small amount dried bloody drainage right side of incision, no ascitic leak; no purulent drainage from incision.    Lab Results:   Recent Labs  03/28/16 0207  WBC 15.5*  HGB 13.3  HCT 40.2  PLT 72*   BMET  Recent Labs  03/28/16 0207  NA 134*  K 3.8  CL 102  CO2 28  GLUCOSE 93  BUN 10  CREATININE 0.65  CALCIUM 7.7*   PT/INR  Recent Labs  03/28/16 0207  LABPROT 20.8*  INR 1.76   CMP     Component Value Date/Time   NA 134 (L) 03/28/2016 0207   K 3.8 03/28/2016 0207   CL 102 03/28/2016 0207   CO2 28 03/28/2016 0207   GLUCOSE 93 03/28/2016 0207   BUN 10 03/28/2016 0207   CREATININE 0.65 03/28/2016 0207   CREATININE 0.90 10/08/2015 1306   CALCIUM 7.7 (L) 03/28/2016 0207   PROT 4.9 (L) 03/27/2016 0345   ALBUMIN 2.0 (L) 03/28/2016 0207   AST 106 (H) 03/27/2016 0345   ALT 48 03/27/2016 0345   ALKPHOS 109 03/27/2016 0345   BILITOT 1.9 (H) 03/27/2016 0345   GFRNONAA >60 03/28/2016 0207    GFRNONAA >89 10/08/2015 1306   GFRAA >60 03/28/2016 0207   GFRAA >89 10/08/2015 1306   Lipase     Component Value Date/Time   LIPASE 44 06/07/2015 1919   Anti-infectives: Anti-infectives    Start     Dose/Rate Route Frequency Ordered Stop   03/25/16 0800  ceFAZolin (ANCEF) IVPB 2g/100 mL premix  Status:  Discontinued     2 g 200 mL/hr over 30 Minutes Intravenous To ShortStay Surgical 03/24/16 2033 03/25/16 0404   03/25/16 0600  vancomycin (VANCOCIN) 500 mg in sodium chloride 0.9 % 100 mL IVPB  Status:  Discontinued     500 mg 100 mL/hr over 60 Minutes Intravenous Every 12 hours 03/24/16 1659 03/27/16 1357   03/24/16 1800  piperacillin-tazobactam (ZOSYN) IVPB 3.375 g     3.375 g 12.5 mL/hr over 240 Minutes Intravenous Every 8 hours 03/24/16 1655     03/24/16 1730  vancomycin (VANCOCIN) 1,250 mg in sodium chloride 0.9 % 250 mL IVPB     1,250 mg 166.7 mL/hr over 90 Minutes Intravenous To Post Anesthesia Care Unit 03/24/16 1655 03/25/16 1730   03/24/16 0815  ceFAZolin (ANCEF) IVPB 2g/100 mL premix     2 g 200 mL/hr over 30 Minutes Intravenous To Short Stay 03/24/16 0757 03/24/16 1240   03/24/16 0759  ceFAZolin (ANCEF) 2-4 GM/100ML-%  IVPB    Comments:  [name], [name]   : cabinet override      03/24/16 0759 03/24/16 1225     Assessment/Plan Incarcerated umbilical hernia S/p Umbilical hernia repair 10/28, Dr. Luisa [name] - WBC spiked to 12.8 yesterday; CBC this AM still pending. - staple removal 04/08/16 POD#14 - outpatient F/U w/ [name] in 2-3 weeks  Cirrhosis/refractory ascites s/p TIPS 10/27 - outpatient F/U TIPS U/S and IR clinic visit in 4 weeks.  FEN: soft  ID: Ancef one dose 10/27, Zosyn 10/28 >>, Vancomycin 10/28 >> VTE: Ettrick heparin, SCD's  Plan: low suspicion for abdomen as source of leukocytosis  Continue soft diet ambulate with assistance   LOS: 6 days    [name] , [facility] 03/30/2016, 7:57 AM Pager: [phone] Consults:  [phone] Mon-Fri 7:00 am-4:30 pm Sat-Sun 7:00 am-11:30 am

## 2016-03-30 NOTE — Progress Notes (Signed)
This patient was discussed during quality collaborative meeting today. Medical Director and CSW ChiropodistAssistant Director state that it would be difficult to place patient in SNF due to patient needing a paracentesis once a week.   CSW singing off. Please re-consult if new needs arise.  Osborne Cascoadia Eduardo Wurth LCSWA [phone]

## 2016-03-30 NOTE — Progress Notes (Signed)
Physical Therapy Treatment Patient Details Name: Libby MawRobert Toppin MRN: [id number] DOB: May 11, 1961 Today's Date: 03/30/2016    History of Present Illness Pt admitted 10/27 with incarcerated umbilical hernia. Pt underwent repair of hernia on 10/18.    PT Comments    The pt is making progress toward goals, but is limited by decreased endurance.  Pt would benefit from further PT in order to increase his strength and endurance.  Continue with POC and begin stair training next session.  Follow Up Recommendations  SNF;Supervision/Assistance - 24 hour     Equipment Recommendations  None recommended by PT    Recommendations for Other Services       Precautions / Restrictions Precautions Precautions: Fall Precaution Comments: pt fell 03/27/16 Restrictions Weight Bearing Restrictions: No    Mobility  Bed Mobility Overal bed mobility: Needs Assistance Bed Mobility: Supine to Sit;Sit to Supine     Supine to sit: Supervision Sit to supine: Min assist   General bed mobility comments: assistance with navigating LEs back onto bed.  used rails.  Transfers Overall transfer level: Needs assistance Equipment used: 4-wheeled walker Transfers: Sit to/from Stand (From bed and toilet) Sit to Stand: Min assist         General transfer comment: VCs for hand placement and to lock the breaks on the walker.  Ambulation/Gait Ambulation/Gait assistance: Min assist Ambulation Distance (Feet): 120 Feet Assistive device: 4-wheeled walker (Walker s/b taller but already on highest setting) Gait Pattern/deviations: Decreased stride length;Shuffle;Trunk flexed;Step-through pattern   Gait velocity interpretation: Below normal speed for age/gender General Gait Details: Required VCs for erect posture and for heel to toe gait.  Cuing to increase stride length.   Stairs            Wheelchair Mobility    Modified Rankin (Stroke Patients Only)       Balance     Sitting balance-Leahy  Scale: Fair       Standing balance-Leahy Scale: Fair                      Cognition Arousal/Alertness: Awake/alert Behavior During Therapy: WFL for tasks assessed/performed Overall Cognitive Status: Within Functional Limits for tasks assessed                      Exercises      General Comments        Pertinent Vitals/Pain Pain Assessment: Faces Faces Pain Scale: Hurts little more Pain Descriptors / Indicators: Grimacing;Sore Pain Intervention(s): Limited activity within patient's tolerance;Monitored during session    Home Living                      Prior Function            PT Goals (current goals can now be found in the care plan section) Acute Rehab PT Goals Patient Stated Goal: go to rehab PT Goal Formulation: With patient Time For Goal Achievement: 04/04/16 Progress towards PT goals: Progressing toward goals    Frequency    Min 3X/week      PT Plan      Co-evaluation             End of Session Equipment Utilized During Treatment: Gait belt Activity Tolerance: Patient tolerated treatment well Patient left: in bed;with call bell/phone within reach     Time: 1640-1710 PT Time Calculation (min) (ACUTE ONLY): 30 min  Charges:  $Gait Training: 8-22 mins $Therapeutic Activity: 8-22 mins  G Codes:      Cathleen CortiMary Cindi Ghazarian 03/30/2016, 5:34 PM Zelphia CairoMary R. Myrtis HoppingLaFar, SPTA Pager [phone]

## 2016-03-30 NOTE — Progress Notes (Signed)
CM made aware by Bonnie([facility]) pt is no longer active with [facility]. Pt was d/c from their services on 03/26/2016. CM to f/u with disposition needs

## 2016-03-30 NOTE — Evaluation (Signed)
Occupational Therapy Evaluation Patient Details Name: [name] MRN: [id number] DOB: September 12, 1960 Today's Date: 03/30/2016    History of Present Illness Pt admitted 10/27 with incarcerated umbilical hernia. Pt underwent repair of hernia on 10/18.   Clinical Impression   Pt with decline in function and safety with ADLs and ADL mobility with decreased strength, balance and endurance. Pt would benefit from acute OT services to address impairments to increase level of function and safety    Follow Up Recommendations  SNF;Supervision/Assistance - 24 hour    Equipment Recommendations  Other (comment) (TBD at next venue of care)    Recommendations for Other Services       Precautions / Restrictions Precautions Precautions: Fall Precaution Comments: pt fell 03/27/16 Restrictions Weight Bearing Restrictions: No      Mobility Bed Mobility Overal bed mobility: Needs Assistance Bed Mobility: Supine to Sit;Sit to Supine     Supine to sit: Supervision;HOB elevated Sit to supine: Supervision   General bed mobility comments: used rails  Transfers Overall transfer level: Needs assistance   Transfers: Sit to/from Stand Sit to Stand: Min assist              Balance Overall balance assessment: Needs assistance   Sitting balance-Leahy Scale: Fair       Standing balance-Leahy Scale: Fair                              ADL Overall ADL's : Needs assistance/impaired     Grooming: Wash/dry hands;Wash/dry face;Sitting;Min guard   Upper Body Bathing: Min guard;Sitting   Lower Body Bathing: Moderate assistance   Upper Body Dressing : Min guard;Sitting   Lower Body Dressing: Maximal assistance   Toilet Transfer: Minimal assistance;BSC   Toileting- Clothing Manipulation and Hygiene: Minimal assistance       Functional mobility during ADLs: Minimal assistance       Vision Vision Assessment?: No apparent visual deficits              Pertinent  Vitals/Pain Pain Assessment: 0-10 Pain Score: 3  Pain Location: surgical site Pain Descriptors / Indicators: Sore Pain Intervention(s): Monitored during session;Repositioned     Hand Dominance Right   Extremity/Trunk Assessment Upper Extremity Assessment Upper Extremity Assessment: Generalized weakness   Lower Extremity Assessment Lower Extremity Assessment: Defer to PT evaluation       Communication Communication Communication: HOH   Cognition Arousal/Alertness: Awake/alert Behavior During Therapy: WFL for tasks assessed/performed Overall Cognitive Status: Within Functional Limits for tasks assessed                     General Comments   pt pleasant and cooperative                 Home Living Family/patient expects to be discharged to:: Skilled nursing facility Living Arrangements: Other relatives                               Additional Comments: pt reports he and his brother stay in a hotel. they don't drive but the care for each other      Prior Functioning/Environment Level of Independence: Independent with assistive device(s)        Comments: uses RW PRN        OT Problem List: Decreased strength;Decreased activity tolerance;Pain;Decreased knowledge of use of DME or AE;Impaired balance (sitting and/or standing)  OT Treatment/Interventions: Self-care/ADL training;DME and/or AE instruction;Therapeutic activities;Patient/family education    OT Goals(Current goals can be found in the care plan section) Acute Rehab OT Goals Patient Stated Goal: go to rehab OT Goal Formulation: With patient/family Time For Goal Achievement: 04/06/16 Potential to Achieve Goals: Good ADL Goals Pt Will Perform Grooming: with supervision;with set-up;sitting Pt Will Perform Upper Body Bathing: with supervision;with set-up;sitting Pt Will Perform Lower Body Bathing: with min assist;sitting/lateral leans Pt Will Perform Upper Body Dressing: with  supervision;with set-up;sitting Pt Will Transfer to Toilet: with min guard assist;regular height toilet;grab bars;ambulating Pt Will Perform Toileting - Clothing Manipulation and hygiene: with min guard assist;sit to/from stand  OT Frequency: Min 2X/week   Barriers to D/C: Decreased caregiver support                        End of Session Equipment Utilized During Treatment: Other (comment) (BSC)  Activity Tolerance: Patient limited by fatigue Patient left: in bed;with call bell/phone within reach;with nursing/sitter in room;with family/visitor present   Time: 4540-98111011-1038 OT Time Calculation (min): 27 min Charges:  OT General Charges $OT Visit: 1 Procedure OT Evaluation $OT Eval Moderate Complexity: 1 Procedure OT Treatments $Therapeutic Activity: 8-22 mins G-Codes:    [name], [name] [name] 03/30/2016, 12:44 PM

## 2016-03-31 NOTE — Discharge Instructions (Signed)
[facility]      [facility] [phone]  OPEN ABDOMINAL SURGERY: POST OP INSTRUCTIONS  Always review your discharge instruction sheet given to you by the facility where your surgery was performed.  IF YOU HAVE DISABILITY OR FAMILY LEAVE FORMS, YOU MUST BRING THEM TO THE OFFICE FOR PROCESSING.  PLEASE DO NOT GIVE THEM TO YOUR DOCTOR.  1. A prescription for pain medication may be given to you upon discharge.  Take your pain medication as prescribed, if needed.  If narcotic pain medicine is not needed, then you may take acetaminophen (Tylenol) or ibuprofen (Advil) as needed. 2. Take your usually prescribed medications unless otherwise directed. 3. If you need a refill on your pain medication, please contact your pharmacy. They will contact our office to request authorization.  Prescriptions will not be filled after 5pm or on week-ends. 4. You should follow a light diet the first few days after arrival home, such as soup and crackers, pudding, etc.unless your doctor has advised otherwise. A high-fiber, low fat diet can be resumed as tolerated.   Be sure to include lots of fluids daily. Most patients will experience some swelling and bruising on the chest and neck area.  Ice packs will help.  Swelling and bruising can take several days to resolve 5. Most patients will experience some swelling and bruising in the area of the incision. Ice pack will help. Swelling and bruising can take several days to resolve..  6. It is common to experience some constipation if taking pain medication after surgery.  Increasing fluid intake and taking a stool softener will usually help or prevent this problem from occurring.  A mild laxative (Milk of Magnesia or Miralax) should be taken according to package directions if there are no bowel movements after 48 hours. 7.  You may have steri-strips (small skin tapes) in place directly over the incision.  These strips should be left on the skin for 7-10 days.  If your  surgeon used skin glue on the incision, you may shower in 24 hours.  The glue will flake off over the next 2-3 weeks.  Any sutures or staples will be removed at the office during your follow-up visit. You may find that a light gauze bandage over your incision may keep your staples from being rubbed or pulled. You may shower and replace the bandage daily. 8. ACTIVITIES:  You may resume regular (light) daily activities beginning the next day--such as daily self-care, walking, climbing stairs--gradually increasing activities as tolerated.  You may have sexual intercourse when it is comfortable.  Refrain from any heavy lifting or straining until approved by your doctor. a. You may drive when you no longer are taking prescription pain medication, you can comfortably wear a seatbelt, and you can safely maneuver your car and apply brakes b. Return to Work: ___________________________________ 9. You should see your doctor in the office for a follow-up appointment approximately two weeks after your surgery.  Make sure that you call for this appointment within a day or two after you arrive home to insure a convenient appointment time. OTHER INSTRUCTIONS:  _____________________________________________________________ _____________________________________________________________  WHEN TO CALL YOUR DOCTOR: 1. Fever over 101.0 2. Inability to urinate 3. Nausea and/or vomiting 4. Extreme swelling or bruising 5. Continued bleeding from incision. 6. Increased pain, redness, or drainage from the incision. 7. Difficulty swallowing or breathing 8. Muscle cramping or spasms. 9. Numbness or tingling in hands or feet or around lips.  The clinic staff is available to   answer your questions during regular business hours.  Please don't hesitate to call and ask to speak to one of the nurses if you have concerns.  For further questions, please visit [email or web address]   

## 2016-03-31 NOTE — Progress Notes (Signed)
PROGRESS NOTE        PATIENT DETAILS Name: [name] Age: 55 y.o. Sex: male Date of Birth: 05-Dec-1960 Admit Date: 03/24/2016 Admitting Physician Roslynn Amble, MD [id number], Venetia Night, MD  Brief Narrative: 55 y.o.MHxDepression, COPD, EtOH abuse,EtOH induced Liver Cirrhosis previously on Hospice w/ persistent Ascites with weekly Paracentesis, and HTN who presented to short stay for a TIPS procedure. While there he was noted to be in acute kidney failure, and was admitted.   upon further evaluation, he was found to have a incarcerated umbilical hernia, and was evaluated by general surgery following which he underwent repair of the umbilical hernia on 10/28. Post operatively, patient patient remain on the ventilator and was in his intensive care unit. Once he was extubated, he was transferred to the [facility] hospitalist service on 10/31.  Subjective: Better-but continues to be weak-not keen on going to SNF. Wants to go home.  Assessment/Plan: Septic Shock: Resolved, leukocytosis has now normalized.Etiology uncertain at this time-but potentially could have spontaneous bacterial peritonitis given ascites-however no paracentesis done to confirm-he has been on empiric Zosyn for the past several days, doubt any utility of repeating paracentesis at this time. He is clinically improved, he does not have any abdominal pain, for now I will continue on Zosyn and touch base with infectious disease tomorrow.  Acute hypoxic Respiratory Failure:  Resolved. Extubated 10/28. Continue incentive spirometry with pulmonary toilette.   Incarcerated umbilical hernia:S/P surgical repair - Gen Surgery following - diet per Gen Surgery   Acute renal failure: Suspect prerenal azotemia-resolved with supportive measures.  Acute encephalopathy: Resolved-thought to be multifactorial  to include EtOH liver cirrhosis, infection, mildly elevated ammonia levels. He remains on lactulose.  Follow  History of liver cirrhosis with refractory ascites: Underwent TIPS procedure on 10/27-plans are to follow up with Dr. Raeanne Barry outpatient IR Clinic in 4 weeks. Continue Aldactone, when blood pressure more stable will add Lasix.  Mild hyponatremia: Probably secondary to liver cirrhosis-follow for now. Plans are to slowly resume Lasix over the next few days, restrict sodium in diet.  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  Deconditioning: Secondary to acute illness, PT evaluation completed-recommendations are for SNF-hour the patient refusing and wants to go back home.  DVT Prophylaxis:  SCD's  Code Status: Full code  Family Communication: None at bedside  Disposition Plan: Remain inpatient- home on 11/4   Antimicrobial agents: See below  Procedures: repair of the umbilical hernia on 10/28  CONSULTS:  pulmonary/intensive care and IR  Time spent: 25- minutes-Greater than 50% of this time was spent in counseling, explanation of diagnosis, planning of further management, and coordination of care.  MEDICATIONS: Anti-infectives    Start     Dose/Rate Route Frequency Ordered Stop   03/25/16 0800  ceFAZolin (ANCEF) IVPB 2g/100 mL premix  Status:  Discontinued     2 g 200 mL/hr over 30 Minutes Intravenous To ShortStay Surgical 03/24/16 2033 03/25/16 0404   03/25/16 0600  vancomycin (VANCOCIN) 500 mg in sodium chloride 0.9 % 100 mL IVPB  Status:  Discontinued     500 mg 100 mL/hr over 60 Minutes Intravenous Every 12 hours 03/24/16 1659  03/27/16 1357   03/24/16 1800  piperacillin-tazobactam (ZOSYN) IVPB 3.375 g     3.375 g 12.5 mL/hr over 240 Minutes Intravenous Every 8 hours  03/24/16 1655     03/24/16 1730  vancomycin (VANCOCIN) 1,250 mg in sodium chloride 0.9 % 250 mL IVPB     1,250 mg 166.7 mL/hr over 90 Minutes Intravenous To Post Anesthesia Care Unit 03/24/16 1655 03/25/16 1730   03/24/16 0815  ceFAZolin (ANCEF) IVPB 2g/100 mL premix     2 g 200 mL/hr over 30 Minutes Intravenous To Short Stay 03/24/16 0757 03/24/16 1240   03/24/16 0759  ceFAZolin (ANCEF) 2-4 GM/100ML-% IVPB    Comments:  Marrianne Mood   : cabinet override      03/24/16 0759 03/24/16 1225      Scheduled Meds: . citalopram  20 mg Oral QPM  . famotidine  20 mg Oral BID  . feeding supplement (ENSURE ENLIVE)  237 mL Oral TID BM  . folic acid  1 mg Oral Daily  . lactulose  10 g Oral TID  . LORazepam  0.5 mg Oral TID  . piperacillin-tazobactam (ZOSYN)  IV  3.375 g Intravenous Q8H  . sodium chloride flush  3 mL Intravenous Q12H  . spironolactone  50 mg Oral Daily  . thiamine  100 mg Oral Daily   Continuous Infusions:  PRN Meds:.albuterol, ondansetron **OR** ondansetron (ZOFRAN) IV   PHYSICAL EXAM: Vital signs: Vitals:   03/30/16 0900 03/30/16 1359 03/30/16 2046 03/31/16 0556  BP: (!) 93/57 (!) 84/53 99/63 (!) 95/58  Pulse: 70 79 71 64  Resp:  16 16 18   Temp: 98.3 F (36.8 C) 98.8 F (37.1 C) 98 F (36.7 C) 99 F (37.2 C)  TempSrc: Oral Oral Oral Oral  SpO2: 95% 96% 98% 100%  Weight:      Height:       Filed Weights   03/27/16 0500 03/28/16 0500 03/29/16 1819  Weight: 69.5 kg (153 lb 3.5 oz) 71 kg (156 lb 8.4 oz) 73.5 kg (162 lb 1.6 oz)   Body mass index is 21.39 kg/m.   General appearance :Awake, alert,looks frail  Eyes:, pupils equally reactive to light and accomodation,no scleral icterus.Pink conjunctiva HEENT: Atraumatic and Normocephalic Neck: supple, no JVD. No cervical lymphadenopathy. No thyromegaly Resp:Good air entry bilaterally, no added sounds  CVS: S1 S2 regular, no murmurs.  GI: Bowel sounds present, midline dressing present-appropriately tender, not  tense.  Extremities: B/L Lower Ext shows no edema, both legs are warm to touch Neurology:  speech clear,Non focal, sensation is grossly intact. Psychiatric: Normal judgment and insight. Alert and oriented x 3.  Musculoskeletal:No digital cyanosis Skin:No Rash, warm and dry Wounds:N/A  I have personally reviewed following labs and imaging studies  LABORATORY DATA: CBC:  Recent Labs Lab 03/25/16 0440 03/26/16 0952 03/27/16 0345 03/28/16 0207 03/30/16 0725  WBC 9.7 10.0 12.8* 15.5* 9.5  NEUTROABS 7.5 8.4*  --  11.0*  --   HGB 13.2 13.0 13.1 13.3 13.1  HCT 38.7* 38.9* 39.8 40.2 39.0  MCV 92.4 93.5 95.7 96.4 94.9  PLT 67* 47* 56* 72* 69*    Basic Metabolic Panel:  Recent Labs Lab 03/25/16 0440 03/26/16 0400 03/27/16 0345 03/28/16 0207 03/30/16 0725  NA 129* 131* 132* 134* 135  K 2.9* 3.6 4.2 3.8 3.4*  CL 94* 103 101 102 103  CO2 28 23 28 28 27   GLUCOSE 132* 158* 137* 93 89  BUN 6 <5* <5* 10 8  CREATININE 0.60* 0.46*  0.61 0.65 0.60*  CALCIUM 7.4* 6.8* 7.4* 7.7* 7.7*  MG 1.5*  --   --  2.0 1.6*  PHOS 2.3*  --   --  1.5* 3.3    GFR: Estimated Creatinine Clearance: 108.5 mL/min (by C-G formula based on SCr of 0.6 mg/dL (L)).  Liver Function Tests:  Recent Labs Lab 03/25/16 0440 03/26/16 0952 03/27/16 0345 03/28/16 0207 03/30/16 0725  AST 114* 148* 106*  --  44*  ALT 33 54 48  --  29  ALKPHOS 114 116 109  --  86  BILITOT 2.8* 2.7* 1.9*  --  2.3*  PROT 5.4* 5.0* 4.9*  --  4.4*  ALBUMIN 2.7* 2.3* 2.2* 2.0* 1.9*   No results for input(s): LIPASE, AMYLASE in the last 168 hours.  Recent Labs Lab 03/25/16 1330 03/26/16 0955 03/29/16 0350  AMMONIA 102* 99* 80*    Coagulation Profile:  Recent Labs Lab 03/25/16 0440 03/26/16 0952 03/27/16 0345 03/28/16 0207  INR 1.88 2.09 1.74 1.76    Cardiac Enzymes:  Recent Labs Lab 03/24/16 1731 03/24/16 2350 03/25/16 0440  TROPONINI 0.04* 0.05* 0.03*    BNP (last 3 results) No results for input(s):  PROBNP in the last 8760 hours.  HbA1C: No results for input(s): HGBA1C in the last 72 hours.  CBG:  Recent Labs Lab 03/28/16 1246 03/28/16 1651 03/29/16 0846 03/29/16 1136 03/29/16 1619  GLUCAP 98 84 75 135* 108*    Lipid Profile: No results for input(s): CHOL, HDL, LDLCALC, TRIG, CHOLHDL, LDLDIRECT in the last 72 hours.  Thyroid Function Tests: No results for input(s): TSH, T4TOTAL, FREET4, T3FREE, THYROIDAB in the last 72 hours.  Anemia Panel: No results for input(s): VITAMINB12, FOLATE, FERRITIN, TIBC, IRON, RETICCTPCT in the last 72 hours.  Urine analysis:    Component Value Date/Time   COLORURINE AMBER (A) 09/06/2015 0714   APPEARANCEUR CLEAR 09/06/2015 0714   LABSPEC 1.017 09/06/2015 0714   PHURINE 6.0 09/06/2015 0714   GLUCOSEU NEGATIVE 09/06/2015 0714   HGBUR TRACE (A) 09/06/2015 0714   BILIRUBINUR SMALL (A) 09/06/2015 0714   KETONESUR 15 (A) 09/06/2015 0714   PROTEINUR NEGATIVE 09/06/2015 0714   UROBILINOGEN 0.2 04/11/2015 0800   NITRITE POSITIVE (A) 09/06/2015 0714   LEUKOCYTESUR SMALL (A) 09/06/2015 0714    Sepsis Labs: Lactic Acid, Venous    Component Value Date/Time   LATICACIDVEN 2.1 (HH) 03/25/2016 0900    MICROBIOLOGY: Recent Results (from the past 240 hour(s))  Culture, blood (routine x 2)     Status: None   Collection Time: 03/24/16  5:36 PM  Result Value Ref Range Status   Specimen Description BLOOD RIGHT HAND  Final   Special Requests BOTTLES DRAWN AEROBIC AND ANAEROBIC 5CC  Final   Culture NO GROWTH 5 DAYS  Final   Report Status 03/29/2016 FINAL  Final  Culture, blood (routine x 2)     Status: None   Collection Time: 03/24/16  5:36 PM  Result Value Ref Range Status   Specimen Description BLOOD A-LINE  Final   Special Requests BOTTLES DRAWN AEROBIC AND ANAEROBIC 5CC  Final   Culture NO GROWTH 5 DAYS  Final   Report Status 03/29/2016 FINAL  Final  Surgical PCR screen     Status: None   Collection Time: 03/27/16 10:30 AM  Result  Value Ref Range Status   MRSA, PCR NEGATIVE NEGATIVE Final   Staphylococcus aureus NEGATIVE NEGATIVE Final    Comment:        The Xpert SA Assay (  FDA approved for NASAL specimens in patients over 69 years of age), is one component of a comprehensive surveillance program.  Test performance has been validated by [facility] for patients greater than or equal to 61 year old. It is not intended to diagnose infection nor to guide or monitor treatment.     RADIOLOGY STUDIES/RESULTS: Ct Abdomen Pelvis Wo Contrast  Addendum Date: 03/24/2016   ADDENDUM REPORT: 03/24/2016 16:37 ADDENDUM: These results were called by telephone at the time of interpretation on 03/24/2016 at 4:20 pm to Dr. Claud Kelp, who verbally acknowledged these results. Electronically Signed   By: Carey Bullocks M.D.   On: 03/24/2016 16:37   Result Date: 03/24/2016 CLINICAL DATA:  Incarcerated umbilical hernia. Left lung nodule. Post TIPS procedure today. EXAM: CT CHEST, ABDOMEN AND PELVIS WITHOUT CONTRAST TECHNIQUE: Multidetector CT imaging of the chest, abdomen and pelvis was performed following the standard protocol without IV contrast. COMPARISON:  Chest radiographs 03/24/2016. FINDINGS: CT CHEST FINDINGS Cardiovascular: Atherosclerosis of the aorta, great vessels and coronary arteries. Right IJ central venous catheter extends into the superior aspect of the right atrium. The heart size is normal. There is no pericardial effusion. Mediastinum/Nodes: There are no enlarged mediastinal, hilar or axillary lymph nodes. Endotracheal and nasogastric tubes are in place. There is possible wall thickening of the distal esophagus. The thyroid gland and trachea demonstrate no significant findings. Lungs/Pleura: There is no pleural effusion. Mild emphysema. There is a small calcified granuloma in the lingula on image 70 which may account for the radiographic finding. No suspicious pulmonary nodules. There is no confluent airspace  opacity or pneumothorax. Musculoskeletal/Chest wall: Bilateral gynecomastia noted. Old rib fractures are present bilaterally. Fractures of the left eighth and ninth ribs may be incompletely healed. There is a fracture of the T12 vertebral body with associated mild osseous retropulsion. This is not clearly seen on chest radiographs from April and could be subacute in age. CT ABDOMEN AND PELVIS FINDINGS Hepatobiliary: Diffuse hepatic steatosis. No focal hepatic abnormalities are seen on noncontrast imaging. There is high-density material layering within the gallbladder lumen. This is likely due to a combination of gallstones and vicarious excretion of contrast. No gallbladder wall thickening or significant biliary dilatation seen. Pancreas: Unremarkable. No pancreatic ductal dilatation or surrounding inflammatory changes. Spleen: Normal in size without focal abnormality. Adrenals/Urinary Tract: Both adrenal glands appear normal. There is contrast material within the renal collecting systems bilaterally, attributed to previous interventional procedure. Low-density renal lesions are present bilaterally, likely cysts. One in the lower pole of the left kidney measures 3.1 cm and 21 HU. No evidence of hydronephrosis. Bladder is partially decompressed by a Foley catheter. There is bladder wall thickening. Stomach/Bowel: The stomach is decompressed by a nasogastric tube. The proximal small bowel is fluid filled with scattered air-fluid levels and mild dilatation. There is an umbilical hernia containing small bowel, measuring up to 7.0 x 8.9 cm transverse. The distal small bowel is decompressed. No colonic abnormalities are seen. There is a small periampullary duodenal diverticulum. Vascular/Lymphatic: There are no enlarged abdominal or pelvic lymph nodes. Diffuse atherosclerosis of the aorta and its branches. TIPS extends from the portal vein to the junction of the IVC with the right hepatic vein. Patency not addressed by  this examination. No evidence of surrounding hematoma. Probable upper abdominal collateral vessels/varices. Reproductive: Mild enlargement of the prostate gland which demonstrates dystrophic calcifications. The seminal vesicles appear unremarkable. Other: As above, umbilical hernia containing small bowel. There is a moderate amount of ascites, with  most of the fluid in the pelvis. Musculoskeletal: No acute or significant osseous findings. IMPRESSION: 1. No suspicious pulmonary nodule. There is a small calcified granuloma in the lingula. 2. Umbilical hernia containing fluid-filled small bowel. There is proximal small bowel dilatation with air-fluid levels, supporting the presence of an incarcerated hernia. 3. Moderate ascites, attributed to underlying liver disease. 4. T12 superior endplate compression fracture with osseous retropulsion, potentially subacute. 5. Indeterminate 3.1 cm left renal lesion, measuring higher than water density. Statistically, this is a complex cyst. 6. Additional incidental findings including emphysema, bilateral rib fractures, hepatic steatosis, diffuse atherosclerosis and bladder wall thickening. Electronically Signed: By: Carey Bullocks M.D. On: 03/24/2016 16:07   Dg Chest 2 View  Result Date: 03/24/2016 CLINICAL DATA:  Pre op today for TIPS,copd,liver dz,htn,,weakness today EXAM: CHEST  2 VIEW COMPARISON:  09/05/2015 FINDINGS: Lungs are hyperinflated. Heart size is normal. No focal consolidations or pleural effusions are identified. Overlying the left mid lung zone there is a 9 mm nodule. Remote rib fractures. IMPRESSION: 1. 9 mm nodule overlying the left mid lung zone. As needed, further characterization can be performed with CT of the chest. Intravenous contrast is recommended unless it is contraindicated. 2. No acute consolidation. These results will be called to the ordering clinician or representative by the Radiologist Assistant, and communication documented in the PACS or  [facility] Dashboard. Electronically Signed   By: Norva Pavlov M.D.   On: 03/24/2016 08:51   Ct Chest Wo Contrast  Addendum Date: 03/24/2016   ADDENDUM REPORT: 03/24/2016 16:37 ADDENDUM: These results were called by telephone at the time of interpretation on 03/24/2016 at 4:20 pm to Dr. Claud Kelp, who verbally acknowledged these results. Electronically Signed   By: Carey Bullocks M.D.   On: 03/24/2016 16:37   Result Date: 03/24/2016 CLINICAL DATA:  Incarcerated umbilical hernia. Left lung nodule. Post TIPS procedure today. EXAM: CT CHEST, ABDOMEN AND PELVIS WITHOUT CONTRAST TECHNIQUE: Multidetector CT imaging of the chest, abdomen and pelvis was performed following the standard protocol without IV contrast. COMPARISON:  Chest radiographs 03/24/2016. FINDINGS: CT CHEST FINDINGS Cardiovascular: Atherosclerosis of the aorta, great vessels and coronary arteries. Right IJ central venous catheter extends into the superior aspect of the right atrium. The heart size is normal. There is no pericardial effusion. Mediastinum/Nodes: There are no enlarged mediastinal, hilar or axillary lymph nodes. Endotracheal and nasogastric tubes are in place. There is possible wall thickening of the distal esophagus. The thyroid gland and trachea demonstrate no significant findings. Lungs/Pleura: There is no pleural effusion. Mild emphysema. There is a small calcified granuloma in the lingula on image 70 which may account for the radiographic finding. No suspicious pulmonary nodules. There is no confluent airspace opacity or pneumothorax. Musculoskeletal/Chest wall: Bilateral gynecomastia noted. Old rib fractures are present bilaterally. Fractures of the left eighth and ninth ribs may be incompletely healed. There is a fracture of the T12 vertebral body with associated mild osseous retropulsion. This is not clearly seen on chest radiographs from April and could be subacute in age. CT ABDOMEN AND PELVIS FINDINGS Hepatobiliary:  Diffuse hepatic steatosis. No focal hepatic abnormalities are seen on noncontrast imaging. There is high-density material layering within the gallbladder lumen. This is likely due to a combination of gallstones and vicarious excretion of contrast. No gallbladder wall thickening or significant biliary dilatation seen. Pancreas: Unremarkable. No pancreatic ductal dilatation or surrounding inflammatory changes. Spleen: Normal in size without focal abnormality. Adrenals/Urinary Tract: Both adrenal glands appear normal. There is contrast material  within the renal collecting systems bilaterally, attributed to previous interventional procedure. Low-density renal lesions are present bilaterally, likely cysts. One in the lower pole of the left kidney measures 3.1 cm and 21 HU. No evidence of hydronephrosis. Bladder is partially decompressed by a Foley catheter. There is bladder wall thickening. Stomach/Bowel: The stomach is decompressed by a nasogastric tube. The proximal small bowel is fluid filled with scattered air-fluid levels and mild dilatation. There is an umbilical hernia containing small bowel, measuring up to 7.0 x 8.9 cm transverse. The distal small bowel is decompressed. No colonic abnormalities are seen. There is a small periampullary duodenal diverticulum. Vascular/Lymphatic: There are no enlarged abdominal or pelvic lymph nodes. Diffuse atherosclerosis of the aorta and its branches. TIPS extends from the portal vein to the junction of the IVC with the right hepatic vein. Patency not addressed by this examination. No evidence of surrounding hematoma. Probable upper abdominal collateral vessels/varices. Reproductive: Mild enlargement of the prostate gland which demonstrates dystrophic calcifications. The seminal vesicles appear unremarkable. Other: As above, umbilical hernia containing small bowel. There is a moderate amount of ascites, with most of the fluid in the pelvis. Musculoskeletal: No acute or  significant osseous findings. IMPRESSION: 1. No suspicious pulmonary nodule. There is a small calcified granuloma in the lingula. 2. Umbilical hernia containing fluid-filled small bowel. There is proximal small bowel dilatation with air-fluid levels, supporting the presence of an incarcerated hernia. 3. Moderate ascites, attributed to underlying liver disease. 4. T12 superior endplate compression fracture with osseous retropulsion, potentially subacute. 5. Indeterminate 3.1 cm left renal lesion, measuring higher than water density. Statistically, this is a complex cyst. 6. Additional incidental findings including emphysema, bilateral rib fractures, hepatic steatosis, diffuse atherosclerosis and bladder wall thickening. Electronically Signed: By: Carey Bullocks M.D. On: 03/24/2016 16:07   US Renal  Result Date: 03/25/2016 CLINICAL DATA:  Urinary retention.  Cirrhosis of liver. EXAM: RENAL / URINARY TRACT ULTRASOUND COMPLETE COMPARISON:  Ultrasound 03/22/2016. FINDINGS: Right Kidney: Length: 11.5 cm. Echogenicity within normal limits. No mass or hydronephrosis visualized. Left Kidney: Length: 13.0 cm. 3 cm left lower pole renal cyst. Echogenicity within normal limits. No mass or hydronephrosis visualized. Bladder: The urinary bladder empty.  Foley catheter in the bladder Mild ascites. IMPRESSION: No renal obstruction.  Urinary bladder empty with Foley catheter Mild ascites, significantly improved from ultrasound of 03/22/2016 Electronically Signed   By: Marlan Palau M.D.   On: 03/25/2016 07:54   US Paracentesis  Result Date: 03/22/2016 INDICATION: History of alcoholic cirrhosis with recurrent ascites. Request for therapeutic paracentesis EXAM: ULTRASOUND GUIDED RIGHT LATERAL ABDOMEN PARACENTESIS MEDICATIONS: 1% Lidocaine. COMPLICATIONS: None immediate. PROCEDURE: Informed written consent was obtained from the patient after a discussion of the risks, benefits and alternatives to treatment. A timeout was  performed prior to the initiation of the procedure. Initial ultrasound scanning demonstrates a large amount of ascites within the right lateral abdomen. The right lateral abdomen was prepped and draped in the usual sterile fashion. 1% lidocaine with epinephrine was used for local anesthesia. Following this, a 19 gauge, 7-cm, Yueh catheter was introduced. An ultrasound image was saved for documentation purposes. The paracentesis was performed. The catheter was removed and a dressing was applied. The patient tolerated the procedure well without immediate post procedural complication. FINDINGS: A total of approximately 2.4 liters of amber fluid was removed. IMPRESSION: Successful ultrasound-guided paracentesis yielding 2.4 liters of peritoneal fluid. Read by:  Corrin Parker, PA-C Electronically Signed   By: Dahlia Client.D.  On: 03/22/2016 15:18   [place] Paracentesis  Result Date: 03/13/2016 INDICATION: Current ascites secondary to alcoholic cirrhosis. Request is made for therapeutic paracentesis with a limit of 4 L and albumin postprocedure. EXAM: ULTRASOUND GUIDED THERAPEUTIC PARACENTESIS MEDICATIONS: 1% lidocaine COMPLICATIONS: None immediate. PROCEDURE: Informed written consent was obtained from the patient after a discussion of the risks, benefits and alternatives to treatment. A timeout was performed prior to the initiation of the procedure. Initial ultrasound scanning demonstrates a large amount of ascites within the right lower abdominal quadrant. The right lower abdomen was prepped and draped in the usual sterile fashion. 1% lidocaine was used for local anesthesia. Following this, a 19 gauge, 7-cm, Yueh catheter was introduced. An ultrasound image was saved for documentation purposes. The paracentesis was performed. The catheter was removed and a dressing was applied. The patient tolerated the procedure well without immediate post procedural complication. FINDINGS: A total of approximately 4 L of yellow fluid  was removed. IMPRESSION: Successful ultrasound-guided paracentesis yielding 4 liters of peritoneal fluid. Read by: Barnetta ChapelKelly Osborne, PA-C Electronically Signed   By: Irish LackGlenn  Yamagata M.D.   On: 03/13/2016 11:16   [place] Paracentesis  Result Date: 03/07/2016 INDICATION: Recurrent ascites EXAM: ULTRASOUND-GUIDED PARACENTESIS COMPARISON:  Previous paracentesis MEDICATIONS: 10 cc 1% lidocaine COMPLICATIONS: None immediate. TECHNIQUE: Informed written consent was obtained from the patient after a discussion of the risks, benefits and alternatives to treatment. A timeout was performed prior to the initiation of the procedure. Initial ultrasound scanning demonstrates a large amount of ascites within the right lower abdominal quadrant. The right lower abdomen was prepped and draped in the usual sterile fashion. 1% lidocaine with epinephrine was used for local anesthesia. Under direct ultrasound guidance, a 19 gauge, 7-cm, Yueh catheter was introduced. An ultrasound image was saved for documentation purposed. The paracentesis was performed. The catheter was removed and a dressing was applied. The patient tolerated the procedure well without immediate post procedural complication. FINDINGS: A total of approximately 4 liters of amber fluid was removed. 4 Liter max per MD. Distended bladder again noted on limited US Abdomen; pt is asymptomatic IMPRESSION: Successful ultrasound-guided paracentesis yielding 4 liters of peritoneal fluid. Read by Robet LeuPamela A Turpin [facility] Electronically Signed   By: Corlis Leak  Hassell M.D.   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 Recurrent large volume ascites requiring nearly weekly paracentesis. See previous consultation. EXAM: TRANSJUGULAR INTRAHEPATIC PORTOSYSTEMIC SHUNT MEDICATIONS: As antibiotic prophylaxis, cefazolin 2 g was ordered pre-procedure and administered intravenously within one hour of incision. ANESTHESIA/SEDATION: General  - as administered by the Anesthesia department CONTRAST:  80 mL Isovue FLUOROSCOPY TIME:  7 minutes 18 seconds, 895 mGy COMPLICATIONS: None immediate. PROCEDURE: Informed written consent was obtained from the patient after a thorough discussion of the procedural risks, benefits and alternatives. All questions were addressed. Maximal Sterile Barrier Technique was utilized including caps, mask, sterile gowns, sterile gloves, sterile drape, hand hygiene and skin antiseptic. A timeout was performed prior to the initiation of the procedure. Under ultrasound guidance, a 21 gauge micropuncture needle was advanced percutaneously into a right portal vein branch. A 018 guidewire was advanced into the main portal vein. 3French micro dilator was used for contrast injection to confirm portal venous positioning. A 5 French micropuncture dilator was placed with Touhy attachment. Confirmatory portal venography performed. Under ultrasound guidance, the right IJ vein was localized. The vessel was accessed with a 21-gauge micropuncture needle, exchanged over a 018 guidewire for  a transitional dilator, through which a 035 guidewire was advanced. After tract dilatation, a 10 French curved vascular sheath was placed, advanced into the IVC. Through this, a 5 French angled angiographic catheter was advanced and used to catheterize the right hepatic vein. Confirmatory right hepatic venography performed. Portal and hepatic venous pressure measurements were obtained, demonstrating 14mmHg mean portosystemic gradient. The 018 guidewire was positioned with its transition point at the confluence of anterior and posterior right portal vein branches as a target. The Col[place]into needle and sheath were advanced through the primary sheath, and the needle advanced into the right portal vein branch under biplane fluoroscopy guidance. After confirmatory contrast injection, an angled stiff glidewire was advanced into the splenic vein. The needle was  exchanged for the 5 French angiographic catheter, advanced into the splenic vein for confirmatory portal venography. This demonstrated multiple esophageal varices and slow antegrade flow through the portal venous system. Measuring pigtail catheter advanced for planning portal venogram. This was exchanged for a a 7 mm Mustang angioplasty catheter used to dilate the parenchymal tract. This allowed advancement of the 10 French sheath into the portal vein. The 10 mm x 8 cm ViaTorr stent was advanced and deployed from right portal vein to right hepatic vein. The stent was dilated with a 10 mm angioplasty balloon. Follow-up pigtail catheter portal venogram demonstrates brisk antegrade flow through the TIPS, much less filling of a variceal channels, and minimal antegrade flow into intrahepatic portal vein branches. There is a nonocclusive mural thrombus near the portosplenic confluence. Pressure measurements reveal a 6 mm Hg portosystemic gradient. The pigtail catheter was withdrawn. The sheath was exchanged for a 25 cm 10 French sheath left with the tip at the SVC/RA junction as venous access. The sheath was secured externally with 0 Prolene suture and placed to a saline flush. The patient tolerated the procedure well. He was transferred to CT for previously planned imaging. IMPRESSION: 1. 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 Outpatient follow-up with a TIPS ultrasound in 1 month. Electronically Signed   By: Corlis Leak  Hassell M.D.   On: 03/24/2016 16:37   Dg Chest Port 1 View  Result Date: 03/24/2016 CLINICAL DATA:  Postop, line placement EXAM: PORTABLE CHEST 1 VIEW COMPARISON:  03/24/2016 FINDINGS: Endotracheal tube is 5 cm above the carina. NG tube enters the stomach. Right internal jugular vascular sheath in place with the tip in the  upper right atrium. No pneumothorax. Heart is normal size. No confluent airspace opacities or effusions. No acute bony abnormality. Old left rib fractures again noted, unchanged. IMPRESSION: No active disease. Electronically Signed   By: Charlett NoseKevin  Dover M.D.   On: 03/24/2016 16:48     LOS: 7 days   Jeoffrey MassedGHIMIRE,Darielle Hancher, MD  [facility] Pager:[phone]  If 7PM-7AM, please contact night-coverage [email or web address] Password [email or web address] 03/31/2016, 2:48 PM

## 2016-03-31 NOTE — Progress Notes (Signed)
CSW revisited idea of SNF with pt- pt states he feels comfortable going home with help from his brother- currently refusing SNF placement.  CSW signing off  Burna SisJenna H. Jasilyn Holderman, LCSW Clinical Social Worker [phone]

## 2016-03-31 NOTE — Progress Notes (Signed)
Referring Physician(s): S Ghimire  Supervising Physician: Ruel FavorsShick, Trevor  [facility]  Chief Complaint:  Alcoholic cirrhosis Refractory ascites TIPS 10/27  Subjective:  TIPS went well; tolerated well Confusion resolved---still on lactulose Surgical procedure for incarcerated umbilical hernia 10/28- [facility] Staples in place Followed by [facility] Afeb; wbc wnl  Allergies: Review of patient's allergies indicates no known allergies.  Medications: Prior to Admission medications   Medication Sig Start Date End Date Taking? Authorizing Provider  albuterol (PROVENTIL HFA;VENTOLIN HFA) 108 (90 Base) MCG/ACT inhaler Inhale 2 puffs into the lungs every 6 (six) hours as needed for wheezing or shortness of breath. 12/02/15  Yes [name], MD  citalopram (CELEXA) 20 MG tablet Take 1 tablet (20 mg total) by mouth daily. Patient taking differently: Take 20 mg by mouth every evening.  03/10/16  Yes [name], MD  diclofenac sodium (VOLTAREN) 1 % GEL Apply 4 g topically 4 (four) times daily. Patient taking differently: Apply 4 g topically 4 (four) times daily as needed (for pain).  10/21/15  Yes [name], MD  furosemide (LASIX) 20 MG tablet TAKE 1 TABLET BY MOUTH DAILY. Patient taking differently: TAKE 1 TABLET BY MOUTH DAILY IN THE AFTERNOON 03/02/16  Yes [name], MD  lactulose (CHRONULAC) 10 GM/15ML solution Take 15 mLs (10 g total) by mouth 2 (two) times daily. 07/22/15  Yes [name], MD  potassium chloride SA (K-DUR,KLOR-CON) 20 MEQ tablet Take 1 tablet (20 mEq total) by mouth daily. Patient taking differently: Take 20 mEq by mouth every evening.  03/10/16  Yes [name], MD  ranitidine (ZANTAC) 150 MG tablet Take 1 tablet (150 mg total) by mouth 2 (two) times daily. 11/22/15  Yes [name], MD  spironolactone (ALDACTONE) 50 MG tablet Take 1 tablet (50 mg total) by mouth daily. 03/10/16  Yes [name], MD     Vital Signs: BP (!) 95/58 (BP Location: Left  Arm)   Pulse 64   Temp 99 F (37.2 C) (Oral)   Resp 18   Ht 6\' 1"  (1.854 m)   Wt 162 lb 1.6 oz (73.5 kg)   SpO2 100%   BMI 21.39 kg/m   Physical Exam  Constitutional: He is oriented to person, place, and time.  Pulmonary/Chest: Effort normal and breath sounds normal.  Abdominal: Soft. Bowel sounds are normal. There is no tenderness.  Musculoskeletal: Normal range of motion.  Neurological: He is alert and oriented to person, place, and time.  Skin: Skin is warm and dry.  Rt IJ site clean/dry/healed abd umbilical surgery site: staples intact No infection  Psychiatric: He has a normal mood and affect. His behavior is normal.  Nursing note and vitals reviewed.   Imaging: No results found.  Labs:  CBC:  Recent Labs  03/26/16 0952 03/27/16 0345 03/28/16 0207 03/30/16 0725  WBC 10.0 12.8* 15.5* 9.5  HGB 13.0 13.1 13.3 13.1  HCT 38.9* 39.8 40.2 39.0  PLT 47* 56* 72* 69*    COAGS:  Recent Labs  09/16/15 1719  02/05/16 1010 03/24/16 0816 03/25/16 0440 03/26/16 0952 03/27/16 0345 03/28/16 0207  INR 1.36  < > 1.19 1.47 1.88 2.09 1.74 1.76  APTT 37  --  35 38* 49*  --   --   --   < > = values in this interval not displayed.  BMP:  Recent Labs  03/26/16 0400 03/27/16 0345 03/28/16 0207 03/30/16 0725  NA 131* 132* 134* 135  K 3.6 4.2 3.8 3.4*  CL 103 101 102 103  CO2 23 28 28 27   GLUCOSE 158* 137* 93 89  BUN <5* <5* 10 8  CALCIUM 6.8* 7.4* 7.7* 7.7*  CREATININE 0.46* 0.61 0.65 0.60*  GFRNONAA >60 >60 >60 >60  GFRAA >60 >60 >60 >60    LIVER FUNCTION TESTS:  Recent Labs  03/25/16 0440 03/26/16 0952 03/27/16 0345 03/28/16 0207 03/30/16 0725  BILITOT 2.8* 2.7* 1.9*  --  2.3*  AST 114* 148* 106*  --  44*  ALT 33 54 48  --  29  ALKPHOS 114 116 109  --  86  PROT 5.4* 5.0* 4.9*  --  4.4*  ALBUMIN 2.7* 2.3* 2.2* 2.0* 1.9*    Assessment and Plan:  TIPS 10/27 Doing well For 4 week follow---orders in Pt will hear from scheduler for time and  date Other plans per [facility]  Electronically Signed: Catheryn Slifer A 03/31/2016, 7:27 AM   I spent a total of 15 Minutes at the the patient's bedside AND on the patient's hospital floor or unit, greater than 50% of which was counseling/coordinating care for TIPS

## 2016-03-31 NOTE — Progress Notes (Signed)
[facility] Progress Note  6 Days Post-Op  Subjective: Nausea last night, resolved today. Sitting up eating breakfast. Denies abd pain. +flatus and 2 BMs daily.  Objective: Vital signs in last 24 hours: Temp:  [98 F (36.7 C)-99 F (37.2 C)] 99 F (37.2 C) (11/03 0556) Pulse Rate:  [64-79] 64 (11/03 0556) Resp:  [16-18] 18 (11/03 0556) BP: (84-99)/(53-63) 95/58 (11/03 0556) SpO2:  [95 %-100 %] 100 % (11/03 0556) Last BM Date: 03/30/16  Intake/Output from previous day: 11/02 0701 - 11/03 0700 In: 50 [IV Piggyback:50] Out: -  Intake/Output this shift: No intake/output data recorded.  PE: Gen:  Alert, NAD, cooperative Pulm:  CTA, no W/R/R Abd: Soft, mild distention, Incision in tact - small amount dried bloody drainage right side of incision, small amount of ascitic/sanguinous drainage from right side of incision with palpation; no purulent drainage from incision.   Lab Results:   Recent Labs  03/30/16 0725  WBC 9.5  HGB 13.1  HCT 39.0  PLT 69*   BMET  Recent Labs  03/30/16 0725  NA 135  K 3.4*  CL 103  CO2 27  GLUCOSE 89  BUN 8  CREATININE 0.60*  CALCIUM 7.7*   CMP     Component Value Date/Time   NA 135 03/30/2016 0725   K 3.4 (L) 03/30/2016 0725   CL 103 03/30/2016 0725   CO2 27 03/30/2016 0725   GLUCOSE 89 03/30/2016 0725   BUN 8 03/30/2016 0725   CREATININE 0.60 (L) 03/30/2016 0725   CREATININE 0.90 10/08/2015 1306   CALCIUM 7.7 (L) 03/30/2016 0725   PROT 4.4 (L) 03/30/2016 0725   ALBUMIN 1.9 (L) 03/30/2016 0725   AST 44 (H) 03/30/2016 0725   ALT 29 03/30/2016 0725   ALKPHOS 86 03/30/2016 0725   BILITOT 2.3 (H) 03/30/2016 0725   GFRNONAA >60 03/30/2016 0725   GFRNONAA >89 10/08/2015 1306   GFRAA >60 03/30/2016 0725   GFRAA >89 10/08/2015 1306   Lipase     Component Value Date/Time   LIPASE 44 06/07/2015 1919   Anti-infectives: Anti-infectives    Start     Dose/Rate Route Frequency Ordered Stop   03/25/16 0800   ceFAZolin (ANCEF) IVPB 2g/100 mL premix  Status:  Discontinued     2 g 200 mL/hr over 30 Minutes Intravenous To ShortStay Surgical 03/24/16 2033 03/25/16 0404   03/25/16 0600  vancomycin (VANCOCIN) 500 mg in sodium chloride 0.9 % 100 mL IVPB  Status:  Discontinued     500 mg 100 mL/hr over 60 Minutes Intravenous Every 12 hours 03/24/16 1659 03/27/16 1357   03/24/16 1800  piperacillin-tazobactam (ZOSYN) IVPB 3.375 g     3.375 g 12.5 mL/hr over 240 Minutes Intravenous Every 8 hours 03/24/16 1655     03/24/16 1730  vancomycin (VANCOCIN) 1,250 mg in sodium chloride 0.9 % 250 mL IVPB     1,250 mg 166.7 mL/hr over 90 Minutes Intravenous To Post Anesthesia Care Unit 03/24/16 1655 03/25/16 1730   03/24/16 0815  ceFAZolin (ANCEF) IVPB 2g/100 mL premix     2 g 200 mL/hr over 30 Minutes Intravenous To Short Stay 03/24/16 0757 03/24/16 1240   03/24/16 0759  ceFAZolin (ANCEF) 2-4 GM/100ML-% IVPB    Comments:  Marrianne MoodAltman, [name]   : cabinet override      03/24/16 0759 03/24/16 1225     Assessment/Plan Incarcerated umbilical hernia S/p Umbilical hernia repair 10/28, Dr. Luisa Hartornett - WBC 9.5  - staple removal 04/08/16 POD#14 -  outpatient F/U w/ [facility] in 2-3 weeks  Cirrhosis/refractory ascites s/p TIPS 10/27 - outpatient F/U TIPS U/S and IR clinic visit in 4 weeks.  FEN: soft  ID: Ancef one dose 10/27, Zosyn 10/28 >>, Vancomycin 10/28 >> VTE: [place] heparin, SCD's  Plan: mild ascitic leak at incsion site - consider paracentesis. Per primary team.   tolerating diet, pain controled, incision in tact, no signs/sxs intra-abdominal infection - stable for discharge from a surgical standpoint. Follow up and d/c instructions provided General surgery will sign off. We will be available as needed. Thank you.   LOS: 7 days    Adam PhenixElizabeth S Chinonso Linker , [facility] 03/31/2016, 7:58 AM Pager: [phone] Consults: [phone] Mon-Fri 7:00 am-4:30 pm Sat-Sun 7:00 am-11:30 am

## 2016-04-01 DIAGNOSIS — K769 Liver disease, unspecified: Secondary | ICD-10-CM

## 2016-04-01 DIAGNOSIS — K7460 Unspecified cirrhosis of liver: Secondary | ICD-10-CM

## 2016-04-01 MED ORDER — RANITIDINE HCL 150 MG PO TABS: 150.0000 mg | ORAL_TABLET | Freq: Two times a day (BID) | ORAL | 0 refills | Status: DC

## 2016-04-01 MED ORDER — CITALOPRAM HYDROBROMIDE 20 MG PO TABS: 20.0000 mg | ORAL_TABLET | Freq: Every day | ORAL | 0 refills | Status: DC

## 2016-04-01 MED ORDER — ENSURE ENLIVE PO LIQD: 237.0000 mL | Freq: Three times a day (TID) | ORAL | 0 refills | Status: DC

## 2016-04-01 MED ORDER — SPIRONOLACTONE 50 MG PO TABS: 50.0000 mg | ORAL_TABLET | Freq: Every day | ORAL | 0 refills | Status: DC

## 2016-04-01 MED ORDER — CIPROFLOXACIN HCL 500 MG PO TABS: 500.0000 mg | ORAL_TABLET | ORAL | 0 refills | Status: DC

## 2016-04-01 MED ORDER — ALBUTEROL SULFATE HFA 108 (90 BASE) MCG/ACT IN AERS: 2.0000 | INHALATION_SPRAY | Freq: Four times a day (QID) | RESPIRATORY_TRACT | 0 refills | Status: DC | PRN

## 2016-04-01 MED ORDER — POTASSIUM CHLORIDE CRYS ER 20 MEQ PO TBCR: 20.0000 meq | EXTENDED_RELEASE_TABLET | Freq: Every day | ORAL | 0 refills | Status: DC

## 2016-04-01 MED ORDER — FUROSEMIDE 20 MG PO TABS: 20.0000 mg | ORAL_TABLET | Freq: Every day | ORAL | 0 refills | Status: DC

## 2016-04-01 MED ORDER — LACTULOSE 10 GM/15ML PO SOLN: 20.0000 g | Freq: Three times a day (TID) | ORAL | 0 refills | Status: DC

## 2016-04-01 MED ORDER — DICLOFENAC SODIUM 1 % TD GEL: 4.0000 g | Freq: Four times a day (QID) | TRANSDERMAL | 0 refills | Status: DC | PRN

## 2016-04-01 NOTE — Care Management Note (Signed)
Case Management Note  Patient Details  Name: [name] MRN: [id number] Date of Birth: Oct 29, 1960  Subjective/Objective:                  Acute Kidney Failure Action/Plan: Discharge planning Expected Discharge Date:  04/01/16               Expected Discharge Plan:  [place]  In-House Referral:     Discharge planning Services  CM Consult  Post Acute Care Choice:  Home Health Choice offered to:  Patient  DME Arranged:  N/A DME Agency:  NA  HH Arranged:  Nurse's Aide, RN [place] Agency:  [facility]  Status of Service:  Completed, signed off  If discussed at [place] of Stay Meetings, dates discussed:    Additional Comments: CM met with pt in room to explain why insurance does not cover HHPT but Medicaid will cover HRN/aide.  CM offered choice of home health agency.  Pt chooses [facility] to render [facility] and aide.  Referral called to [facility] rep, Jermaine.  NO other CM needs were communicated.  No other CM needs were communicated. Dellie Catholic, RN 04/01/2016, 3:56 PM

## 2016-04-01 NOTE — Discharge Summary (Signed)
PATIENT DETAILS Name: [name] Age: 55 y.o. Sex: male Date of Birth: 1960/07/22 MRN: [id number]. Admitting Physician: Roslynn Amble, MD WUJ:[id number], Venetia Night, MD  Admit Date: 03/24/2016 Discharge date: 04/01/2016  Recommendations for Outpatient Follow-up:  1. Follow up with PCP in 1 weeks 2. Ensure follow-up with interventional radiology and general surgery. 3. Please obtain BMP/CBC in one week  Admitted From:  Home   Disposition: Home with home health services(refused SNF)   Home Health: Yes  Equipment/Devices: None  Discharge Condition: Stable  CODE STATUS: FULL CODE  Diet recommendation:  Heart Healthy   Brief Summary: See H&P, Labs, Consult and Test reports for all details in brief,55 y.o.MHxDepression, COPD, EtOH abuse,EtOH induced Liver Cirrhosis previously on Hospice w/ persistent Ascites with weekly Paracentesis, and HTN who presentedto short stay for a TIPS procedure. While there he was noted to be in acute kidney failure, and was admitted.  upon further evaluation, he was found to have a incarcerated umbilical hernia, and was evaluated by general surgery following which he underwent repair of the umbilical hernia on 10/28. Post operatively, patient patient remain on the ventilator and was in his intensive care unit. Once he was extubated, he was transferred to the [facility] hospitalist service on 10/31  Brief Hospital Course: Septic Shock: Resolved, leukocytosis has now normalized.Etiology uncertain at this time-but potentially could have spontaneous bacterial peritonitis given ascites-however no paracentesis done to confirm-he has been on empiric Zosyn since 10/27-blood cultures are negative.Case was discussed with infectious disease, Dr. Drue Second reviewed the chart over the phone-current recommendations are to discharge patient on prophylactic ciprofloxacin 500 mg given weekly. Have asked patient to follow with his PCP for further continued care.  Acute  hypoxic Respiratory Failure: Resolved. Extubated 10/28. Continue incentive spirometry with pulmonary toilette.   Incarcerated umbilical hernia:S/P surgical repair - Gen Surgery followed the patient closely during this hospital stay, the recommendations are to discharge and have patient follow up with them as an outpatient. Note easily tolerating diet on the day of discharge. Denies any abdominal pain.  Acute renal failure: Suspect prerenal azotemia-resolved with supportive measures.  Acute encephalopathy: Resolved-thought to be multifactorial to include EtOH liver cirrhosis, infection, mildly elevated ammonia levels. He remains on lactulose.  History of liver cirrhosis with refractory ascites: Underwent TIPS procedure on 10/27-plans are to follow up with Dr. Raeanne Barry outpatient IR Clinic in 4 weeks. His abdomen is not distended-he has not required a paracentesis following the TIPS procedure. Plans are to continue Aldactone and Lasix, and have him follow-up with interventional radiology as an outpatient. He will need to follow up with his primary care M.D. for further continued care  Mild hyponatremia: Probably secondary to liver cirrhosis-follow for now. Plans are to y resume Lasix, and follow up with PCP. Continue to restrict sodium in diet.   Thrombocytopenia:due to cirrhoses/splenic sequestration - follow closely in the outpatient setting  Coagulopathy:baseline INR 1.19-1.36-due to cirrhoses  GERD: Continue Pepcid  Depression with anxiety: Continue Celexa   Severe malnutrition in context of chronic illness  Tobacco abuse: Will need ongoing counseling regarding importance of quitting.  Deconditioning: Secondary to acute illness, PT evaluation completed-recommendations are for SNF-hour the patient refusing and wants to go back home-hence will be discharged with home health services.  Procedures/Studies: S/p Umbilical hernia repair 10/28 TIPS 10/27  Discharge Diagnoses:    Principal Problem:   AKI (acute kidney injury) (HCC) Active Problems:   Thrombocytopenia (HCC)   Alcoholic cirrhosis of liver with ascites (HCC)   Tobacco  abuse   Decompensated hepatic cirrhosis (HCC)   Hypokalemia   Protein-calorie malnutrition (HCC)   Ascites   Chronic respiratory failure (HCC)   Chronic liver disease and cirrhosis (HCC)   GERD (gastroesophageal reflux disease)   Umbilical hernia, incarcerated   Pulmonary nodule   Cirrhosis (HCC)   Acute kidney injury (HCC)   Acute renal failure with tubular necrosis [facility])   Discharge Instructions:  Activity:  As tolerated with Full fall precautions use walker/cane & assistance as needed   Discharge Instructions    Call MD for:  persistant nausea and vomiting    Complete by:  As directed    Call MD for:  severe uncontrolled pain    Complete by:  As directed    Diet - low sodium heart healthy    Complete by:  As directed    Increase activity slowly    Complete by:  As directed        Medication List    TAKE these medications   albuterol 108 (90 Base) MCG/ACT inhaler Commonly known as:  PROVENTIL HFA;VENTOLIN HFA Inhale 2 puffs into the lungs every 6 (six) hours as needed for wheezing or shortness of breath.   ciprofloxacin 500 MG tablet Commonly known as:  CIPRO Take 1 tablet (500 mg total) by mouth once a week.   citalopram 20 MG tablet Commonly known as:  CELEXA Take 1 tablet (20 mg total) by mouth daily. What changed:  when to take this   diclofenac sodium 1 % Gel Commonly known as:  VOLTAREN Apply 4 g topically 4 (four) times daily as needed (for pain).   feeding supplement (ENSURE ENLIVE) Liqd Take 237 mLs by mouth 3 (three) times daily between meals.   furosemide 20 MG tablet Commonly known as:  LASIX Take 1 tablet (20 mg total) by mouth daily. What changed:  See the new instructions.   lactulose 10 GM/15ML solution Commonly known as:  CHRONULAC Take 30 mLs (20 g total) by mouth 3 (three)  times daily. What changed:  how much to take  when to take this   potassium chloride SA 20 MEQ tablet Commonly known as:  K-DUR,KLOR-CON Take 1 tablet (20 mEq total) by mouth daily. What changed:  when to take this   ranitidine 150 MG tablet Commonly known as:  ZANTAC Take 1 tablet (150 mg total) by mouth 2 (two) times daily.   spironolactone 50 MG tablet Commonly known as:  ALDACTONE Take 1 tablet (50 mg total) by mouth daily.      Follow-up Information    HASSELL III, DAYNE DANIEL, MD Follow up in 4 week(s).   Specialty:  Interventional Radiology Why:  TIPS [place] at IR OP clininc; pt will hear from scheduler for time and date; call [phone] if questions or concerns Contact information: [address] [place] [address] [phone]        CORNETT,THOMAS A., MD. Schedule an appointment as soon as possible for a visit in 2 week(s).   Specialty:  General Surgery Why:  for post-operative follow up Contact information: [address] [place] [address] [phone]        [facility], [place]. Schedule an appointment as soon as possible for a visit on 04/08/2016.   Specialty:  General Surgery Why:  with a nurse to have your staples removed. Contact information: [address] [place] [address] [phone]       [name], MD.  Schedule an appointment as soon as possible for a visit in 1 week(s).   Specialty:  Family Medicine Contact information: [address] [place] [address] [phone]          No Known Allergies  Consultations:   pulmonary/intensive care, general surgery and ir   Other Procedures/Studies: Ct Abdomen Pelvis Wo Contrast  Addendum Date: 03/24/2016   ADDENDUM REPORT: 03/24/2016 16:37 ADDENDUM: These results were called by telephone at the time of interpretation on 03/24/2016 at 4:20 pm to Dr. Claud Kelp, who verbally acknowledged these  results. Electronically Signed   By: Carey Bullocks M.D.   On: 03/24/2016 16:37   Result Date: 03/24/2016 CLINICAL DATA:  Incarcerated umbilical hernia. Left lung nodule. Post TIPS procedure today. EXAM: CT CHEST, ABDOMEN AND PELVIS WITHOUT CONTRAST TECHNIQUE: Multidetector CT imaging of the chest, abdomen and pelvis was performed following the standard protocol without IV contrast. COMPARISON:  Chest radiographs 03/24/2016. FINDINGS: CT CHEST FINDINGS Cardiovascular: Atherosclerosis of the aorta, great vessels and coronary arteries. Right IJ central venous catheter extends into the superior aspect of the right atrium. The heart size is normal. There is no pericardial effusion. Mediastinum/Nodes: There are no enlarged mediastinal, hilar or axillary lymph nodes. Endotracheal and nasogastric tubes are in place. There is possible wall thickening of the distal esophagus. The thyroid gland and trachea demonstrate no significant findings. Lungs/Pleura: There is no pleural effusion. Mild emphysema. There is a small calcified granuloma in the lingula on image 70 which may account for the radiographic finding. No suspicious pulmonary nodules. There is no confluent airspace opacity or pneumothorax. Musculoskeletal/Chest wall: Bilateral gynecomastia noted. Old rib fractures are present bilaterally. Fractures of the left eighth and ninth ribs may be incompletely healed. There is a fracture of the T12 vertebral body with associated mild osseous retropulsion. This is not clearly seen on chest radiographs from April and could be subacute in age. CT ABDOMEN AND PELVIS FINDINGS Hepatobiliary: Diffuse hepatic steatosis. No focal hepatic abnormalities are seen on noncontrast imaging. There is high-density material layering within the gallbladder lumen. This is likely due to a combination of gallstones and vicarious excretion of contrast. No gallbladder wall thickening or significant biliary dilatation seen. Pancreas: Unremarkable.  No pancreatic ductal dilatation or surrounding inflammatory changes. Spleen: Normal in size without focal abnormality. Adrenals/Urinary Tract: Both adrenal glands appear normal. There is contrast material within the renal collecting systems bilaterally, attributed to previous interventional procedure. Low-density renal lesions are present bilaterally, likely cysts. One in the lower pole of the left kidney measures 3.1 cm and 21 HU. No evidence of hydronephrosis. Bladder is partially decompressed by a Foley catheter. There is bladder wall thickening. Stomach/Bowel: The stomach is decompressed by a nasogastric tube. The proximal small bowel is fluid filled with scattered air-fluid levels and mild dilatation. There is an umbilical hernia containing small bowel, measuring up to 7.0 x 8.9 cm transverse. The distal small bowel is decompressed. No colonic abnormalities are seen. There is a small periampullary duodenal diverticulum. Vascular/Lymphatic: There are no enlarged abdominal or pelvic lymph nodes. Diffuse atherosclerosis of the aorta and its branches. TIPS extends from the portal vein to the junction of the IVC with the right hepatic vein. Patency not addressed by this examination. No evidence of surrounding hematoma. Probable upper abdominal collateral vessels/varices. Reproductive: Mild enlargement of the prostate gland which demonstrates dystrophic calcifications. The seminal vesicles appear unremarkable. Other: As above, umbilical hernia containing small bowel. There is a moderate amount of ascites, with most of the  fluid in the pelvis. Musculoskeletal: No acute or significant osseous findings. IMPRESSION: 1. No suspicious pulmonary nodule. There is a small calcified granuloma in the lingula. 2. Umbilical hernia containing fluid-filled small bowel. There is proximal small bowel dilatation with air-fluid levels, supporting the presence of an incarcerated hernia. 3. Moderate ascites, attributed to underlying  liver disease. 4. T12 superior endplate compression fracture with osseous retropulsion, potentially subacute. 5. Indeterminate 3.1 cm left renal lesion, measuring higher than water density. Statistically, this is a complex cyst. 6. Additional incidental findings including emphysema, bilateral rib fractures, hepatic steatosis, diffuse atherosclerosis and bladder wall thickening. Electronically Signed: By: Carey Bullocks M.D. On: 03/24/2016 16:07   Dg Chest 2 View  Result Date: 03/24/2016 CLINICAL DATA:  Pre op today for TIPS,copd,liver dz,htn,,weakness today EXAM: CHEST  2 VIEW COMPARISON:  09/05/2015 FINDINGS: Lungs are hyperinflated. Heart size is normal. No focal consolidations or pleural effusions are identified. Overlying the left mid lung zone there is a 9 mm nodule. Remote rib fractures. IMPRESSION: 1. 9 mm nodule overlying the left mid lung zone. As needed, further characterization can be performed with CT of the chest. Intravenous contrast is recommended unless it is contraindicated. 2. No acute consolidation. These results will be called to the ordering clinician or representative by the Radiologist Assistant, and communication documented in the PACS or zVision Dashboard. Electronically Signed   By: Norva Pavlov M.D.   On: 03/24/2016 08:51   Ct Chest Wo Contrast  Addendum Date: 03/24/2016   ADDENDUM REPORT: 03/24/2016 16:37 ADDENDUM: These results were called by telephone at the time of interpretation on 03/24/2016 at 4:20 pm to Dr. Claud Kelp, who verbally acknowledged these results. Electronically Signed   By: Carey Bullocks M.D.   On: 03/24/2016 16:37   Result Date: 03/24/2016 CLINICAL DATA:  Incarcerated umbilical hernia. Left lung nodule. Post TIPS procedure today. EXAM: CT CHEST, ABDOMEN AND PELVIS WITHOUT CONTRAST TECHNIQUE: Multidetector CT imaging of the chest, abdomen and pelvis was performed following the standard protocol without IV contrast. COMPARISON:  Chest radiographs  03/24/2016. FINDINGS: CT CHEST FINDINGS Cardiovascular: Atherosclerosis of the aorta, great vessels and coronary arteries. Right IJ central venous catheter extends into the superior aspect of the right atrium. The heart size is normal. There is no pericardial effusion. Mediastinum/Nodes: There are no enlarged mediastinal, hilar or axillary lymph nodes. Endotracheal and nasogastric tubes are in place. There is possible wall thickening of the distal esophagus. The thyroid gland and trachea demonstrate no significant findings. Lungs/Pleura: There is no pleural effusion. Mild emphysema. There is a small calcified granuloma in the lingula on image 70 which may account for the radiographic finding. No suspicious pulmonary nodules. There is no confluent airspace opacity or pneumothorax. Musculoskeletal/Chest wall: Bilateral gynecomastia noted. Old rib fractures are present bilaterally. Fractures of the left eighth and ninth ribs may be incompletely healed. There is a fracture of the T12 vertebral body with associated mild osseous retropulsion. This is not clearly seen on chest radiographs from April and could be subacute in age. CT ABDOMEN AND PELVIS FINDINGS Hepatobiliary: Diffuse hepatic steatosis. No focal hepatic abnormalities are seen on noncontrast imaging. There is high-density material layering within the gallbladder lumen. This is likely due to a combination of gallstones and vicarious excretion of contrast. No gallbladder wall thickening or significant biliary dilatation seen. Pancreas: Unremarkable. No pancreatic ductal dilatation or surrounding inflammatory changes. Spleen: Normal in size without focal abnormality. Adrenals/Urinary Tract: Both adrenal glands appear normal. There is contrast material within the renal  collecting systems bilaterally, attributed to previous interventional procedure. Low-density renal lesions are present bilaterally, likely cysts. One in the lower pole of the left kidney measures  3.1 cm and 21 HU. No evidence of hydronephrosis. Bladder is partially decompressed by a Foley catheter. There is bladder wall thickening. Stomach/Bowel: The stomach is decompressed by a nasogastric tube. The proximal small bowel is fluid filled with scattered air-fluid levels and mild dilatation. There is an umbilical hernia containing small bowel, measuring up to 7.0 x 8.9 cm transverse. The distal small bowel is decompressed. No colonic abnormalities are seen. There is a small periampullary duodenal diverticulum. Vascular/Lymphatic: There are no enlarged abdominal or pelvic lymph nodes. Diffuse atherosclerosis of the aorta and its branches. TIPS extends from the portal vein to the junction of the IVC with the right hepatic vein. Patency not addressed by this examination. No evidence of surrounding hematoma. Probable upper abdominal collateral vessels/varices. Reproductive: Mild enlargement of the prostate gland which demonstrates dystrophic calcifications. The seminal vesicles appear unremarkable. Other: As above, umbilical hernia containing small bowel. There is a moderate amount of ascites, with most of the fluid in the pelvis. Musculoskeletal: No acute or significant osseous findings. IMPRESSION: 1. No suspicious pulmonary nodule. There is a small calcified granuloma in the lingula. 2. Umbilical hernia containing fluid-filled small bowel. There is proximal small bowel dilatation with air-fluid levels, supporting the presence of an incarcerated hernia. 3. Moderate ascites, attributed to underlying liver disease. 4. T12 superior endplate compression fracture with osseous retropulsion, potentially subacute. 5. Indeterminate 3.1 cm left renal lesion, measuring higher than water density. Statistically, this is a complex cyst. 6. Additional incidental findings including emphysema, bilateral rib fractures, hepatic steatosis, diffuse atherosclerosis and bladder wall thickening. Electronically Signed: By: Carey Bullocks M.D. On: 03/24/2016 16:07   US Renal  Result Date: 03/25/2016 CLINICAL DATA:  Urinary retention.  Cirrhosis of liver. EXAM: RENAL / URINARY TRACT ULTRASOUND COMPLETE COMPARISON:  Ultrasound 03/22/2016. FINDINGS: Right Kidney: Length: 11.5 cm. Echogenicity within normal limits. No mass or hydronephrosis visualized. Left Kidney: Length: 13.0 cm. 3 cm left lower pole renal cyst. Echogenicity within normal limits. No mass or hydronephrosis visualized. Bladder: The urinary bladder empty.  Foley catheter in the bladder Mild ascites. IMPRESSION: No renal obstruction.  Urinary bladder empty with Foley catheter Mild ascites, significantly improved from ultrasound of 03/22/2016 Electronically Signed   By: Marlan Palau M.D.   On: 03/25/2016 07:54   US Paracentesis  Result Date: 03/22/2016 INDICATION: History of alcoholic cirrhosis with recurrent ascites. Request for therapeutic paracentesis EXAM: ULTRASOUND GUIDED RIGHT LATERAL ABDOMEN PARACENTESIS MEDICATIONS: 1% Lidocaine. COMPLICATIONS: None immediate. PROCEDURE: Informed written consent was obtained from the patient after a discussion of the risks, benefits and alternatives to treatment. A timeout was performed prior to the initiation of the procedure. Initial ultrasound scanning demonstrates a large amount of ascites within the right lateral abdomen. The right lateral abdomen was prepped and draped in the usual sterile fashion. 1% lidocaine with epinephrine was used for local anesthesia. Following this, a 19 gauge, 7-cm, Yueh catheter was introduced. An ultrasound image was saved for documentation purposes. The paracentesis was performed. The catheter was removed and a dressing was applied. The patient tolerated the procedure well without immediate post procedural complication. FINDINGS: A total of approximately 2.4 liters of amber fluid was removed. IMPRESSION: Successful ultrasound-guided paracentesis yielding 2.4 liters of peritoneal fluid. Read by:   Corrin Parker, PA-C Electronically Signed   By: Jolaine Click M.D.   On: 03/22/2016  15:18   US Paracentesis  Result Date: 03/13/2016 INDICATION: Current ascites secondary to alcoholic cirrhosis. Request is made for therapeutic paracentesis with a limit of 4 L and albumin postprocedure. EXAM: ULTRASOUND GUIDED THERAPEUTIC PARACENTESIS MEDICATIONS: 1% lidocaine COMPLICATIONS: None immediate. PROCEDURE: Informed written consent was obtained from the patient after a discussion of the risks, benefits and alternatives to treatment. A timeout was performed prior to the initiation of the procedure. Initial ultrasound scanning demonstrates a large amount of ascites within the right lower abdominal quadrant. The right lower abdomen was prepped and draped in the usual sterile fashion. 1% lidocaine was used for local anesthesia. Following this, a 19 gauge, 7-cm, Yueh catheter was introduced. An ultrasound image was saved for documentation purposes. The paracentesis was performed. The catheter was removed and a dressing was applied. The patient tolerated the procedure well without immediate post procedural complication. FINDINGS: A total of approximately 4 L of yellow fluid was removed. IMPRESSION: Successful ultrasound-guided paracentesis yielding 4 liters of peritoneal fluid. Read by: Barnetta Chapel, PA-C Electronically Signed   By: Irish Lack M.D.   On: 03/13/2016 11:16   US Paracentesis  Result Date: 03/07/2016 INDICATION: Recurrent ascites EXAM: ULTRASOUND-GUIDED PARACENTESIS COMPARISON:  Previous paracentesis MEDICATIONS: 10 cc 1% lidocaine COMPLICATIONS: None immediate. TECHNIQUE: Informed written consent was obtained from the patient after a discussion of the risks, benefits and alternatives to treatment. A timeout was performed prior to the initiation of the procedure. Initial ultrasound scanning demonstrates a large amount of ascites within the right lower abdominal quadrant. The right lower abdomen was  prepped and draped in the usual sterile fashion. 1% lidocaine with epinephrine was used for local anesthesia. Under direct ultrasound guidance, a 19 gauge, 7-cm, Yueh catheter was introduced. An ultrasound image was saved for documentation purposed. The paracentesis was performed. The catheter was removed and a dressing was applied. The patient tolerated the procedure well without immediate post procedural complication. FINDINGS: A total of approximately 4 liters of amber fluid was removed. 4 Liter max per MD. Distended bladder again noted on limited US Abdomen; pt is asymptomatic IMPRESSION: Successful ultrasound-guided paracentesis yielding 4 liters of peritoneal fluid. Read by Robet Leu [facility] Electronically Signed   By: Corlis Leak M.D.   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 Recurrent large volume ascites requiring nearly weekly paracentesis. See previous consultation. EXAM: TRANSJUGULAR INTRAHEPATIC PORTOSYSTEMIC SHUNT MEDICATIONS: As antibiotic prophylaxis, cefazolin 2 g was ordered pre-procedure and administered intravenously within one hour of incision. ANESTHESIA/SEDATION: General - as administered by the Anesthesia department CONTRAST:  80 mL Isovue FLUOROSCOPY TIME:  7 minutes 18 seconds, 895 mGy COMPLICATIONS: None immediate. PROCEDURE: Informed written consent was obtained from the patient after a thorough discussion of the procedural risks, benefits and alternatives. All questions were addressed. Maximal Sterile Barrier Technique was utilized including caps, mask, sterile gowns, sterile gloves, sterile drape, hand hygiene and skin antiseptic. A timeout was performed prior to the initiation of the procedure. Under ultrasound guidance, a 21 gauge micropuncture needle was advanced percutaneously into a right portal vein branch. A 018 guidewire was advanced into the main portal vein. 3French micro dilator was used for contrast  injection to confirm portal venous positioning. A 5 French micropuncture dilator was placed with Touhy attachment. Confirmatory portal venography performed. Under ultrasound guidance, the right IJ vein was localized. The vessel was accessed with a 21-gauge micropuncture needle, exchanged over a 018 guidewire for a transitional  dilator, through which a 035 guidewire was advanced. After tract dilatation, a 10 French curved vascular sheath was placed, advanced into the IVC. Through this, a 5 French angled angiographic catheter was advanced and used to catheterize the right hepatic vein. Confirmatory right hepatic venography performed. Portal and hepatic venous pressure measurements were obtained, demonstrating mean portosystemic gradient. The 018 guidewire was positioned with its transition point at the confluence of anterior and posterior right portal vein branches as a target. The Colapinto needle and sheath were advanced through the primary sheath, and the needle advanced into the right portal vein branch under biplane fluoroscopy guidance. After confirmatory contrast injection, an angled stiff glidewire was advanced into the splenic vein. The needle was exchanged for the 5 French angiographic catheter, advanced into the splenic vein for confirmatory portal venography. This demonstrated multiple esophageal varices and slow antegrade flow through the portal venous system. Measuring pigtail catheter advanced for planning portal venogram. This was exchanged for a a 7 mm Mustang angioplasty catheter used to dilate the parenchymal tract. This allowed advancement of the 10 French sheath into the portal vein. The 10 mm x 8 cm ViaTorr stent was advanced and deployed from right portal vein to right hepatic vein. The stent was dilated with a 10 mm angioplasty balloon. Follow-up pigtail catheter portal venogram demonstrates brisk antegrade flow through the TIPS, much less filling of a variceal channels, and minimal  antegrade flow into intrahepatic portal vein branches. There is a nonocclusive mural thrombus near the portosplenic confluence. Pressure measurements reveal a 6 mm Hg portosystemic gradient. The pigtail catheter was withdrawn. The sheath was exchanged for a 25 cm 10 French sheath left with the tip at the SVC/RA junction as venous access. The sheath was secured externally with 0 Prolene suture and placed to a saline flush. The patient tolerated the procedure well. He was transferred to CT for previously planned imaging. IMPRESSION: 1. 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 Outpatient follow-up with a TIPS ultrasound in 1 month. Electronically Signed   By: Corlis Leak M.D.   On: 03/24/2016 16:37   Dg Chest Port 1 View  Result Date: 03/24/2016 CLINICAL DATA:  Postop, line placement EXAM: PORTABLE CHEST 1 VIEW COMPARISON:  03/24/2016 FINDINGS: Endotracheal tube is 5 cm above the carina. NG tube enters the stomach. Right internal jugular vascular sheath in place with the tip in the upper right atrium. No pneumothorax. Heart is normal size. No confluent airspace opacities or effusions. No acute bony abnormality. Old left rib fractures again noted, unchanged. IMPRESSION: No active disease. Electronically Signed   By: Charlett Nose M.D.   On: 03/24/2016 16:48      TODAY-DAY OF DISCHARGE:  Subjective:   [name] today has no headache,no chest abdominal pain,no new weakness tingling or numbness, feels much better wants to go home today.   Objective:   Blood pressure (!) 91/58, pulse 72, temperature 98 F (36.7 C), temperature source Oral, resp. rate 18, height 6\' 1"  (1.854 m), weight 72.5 kg (159 lb 12.8 oz), SpO2 97 %.  Intake/Output Summary (Last 24 hours) at 04/01/16 1122 Last data filed at 04/01/16 0827  Gross per  24 hour  Intake              150 ml  Output  350 ml  Net             -200 ml   Filed Weights   03/28/16 0500 03/29/16 1819 04/01/16 0524  Weight: 71 kg (156 lb 8.4 oz) 73.5 kg (162 lb 1.6 oz) 72.5 kg (159 lb 12.8 oz)    Exam: Awake Alert, Oriented *3, No new F.N deficits, Normal affect Aline.AT,PERRAL Supple Neck,No JVD, No cervical lymphadenopathy appriciated.  Symmetrical Chest wall movement, Good air movement bilaterally, CTAB RRR,No Gallops,Rubs or new Murmurs, No Parasternal Heave +ve B.Sounds, Abd Soft, Non tender, No organomegaly appriciated, No rebound -guarding or rigidity. No Cyanosis, Clubbing or edema, No new Rash or bruise   PERTINENT RADIOLOGIC STUDIES: Ct Abdomen Pelvis Wo Contrast  Addendum Date: 03/24/2016   ADDENDUM REPORT: 03/24/2016 16:37 ADDENDUM: These results were called by telephone at the time of interpretation on 03/24/2016 at 4:20 pm to Dr. Claud Kelp, who verbally acknowledged these results. Electronically Signed   By: Carey Bullocks M.D.   On: 03/24/2016 16:37   Result Date: 03/24/2016 CLINICAL DATA:  Incarcerated umbilical hernia. Left lung nodule. Post TIPS procedure today. EXAM: CT CHEST, ABDOMEN AND PELVIS WITHOUT CONTRAST TECHNIQUE: Multidetector CT imaging of the chest, abdomen and pelvis was performed following the standard protocol without IV contrast. COMPARISON:  Chest radiographs 03/24/2016. FINDINGS: CT CHEST FINDINGS Cardiovascular: Atherosclerosis of the aorta, great vessels and coronary arteries. Right IJ central venous catheter extends into the superior aspect of the right atrium. The heart size is normal. There is no pericardial effusion. Mediastinum/Nodes: There are no enlarged mediastinal, hilar or axillary lymph nodes. Endotracheal and nasogastric tubes are in place. There is possible wall thickening of the distal esophagus. The thyroid gland and trachea demonstrate no significant findings. Lungs/Pleura: There is no pleural  effusion. Mild emphysema. There is a small calcified granuloma in the lingula on image 70 which may account for the radiographic finding. No suspicious pulmonary nodules. There is no confluent airspace opacity or pneumothorax. Musculoskeletal/Chest wall: Bilateral gynecomastia noted. Old rib fractures are present bilaterally. Fractures of the left eighth and ninth ribs may be incompletely healed. There is a fracture of the T12 vertebral body with associated mild osseous retropulsion. This is not clearly seen on chest radiographs from April and could be subacute in age. CT ABDOMEN AND PELVIS FINDINGS Hepatobiliary: Diffuse hepatic steatosis. No focal hepatic abnormalities are seen on noncontrast imaging. There is high-density material layering within the gallbladder lumen. This is likely due to a combination of gallstones and vicarious excretion of contrast. No gallbladder wall thickening or significant biliary dilatation seen. Pancreas: Unremarkable. No pancreatic ductal dilatation or surrounding inflammatory changes. Spleen: Normal in size without focal abnormality. Adrenals/Urinary Tract: Both adrenal glands appear normal. There is contrast material within the renal collecting systems bilaterally, attributed to previous interventional procedure. Low-density renal lesions are present bilaterally, likely cysts. One in the lower pole of the left kidney measures 3.1 cm and 21 HU. No evidence of hydronephrosis. Bladder is partially decompressed by a Foley catheter. There is bladder wall thickening. Stomach/Bowel: The stomach is decompressed by a nasogastric tube. The proximal small bowel is fluid filled with scattered air-fluid levels and mild dilatation. There is an umbilical hernia containing small bowel, measuring up to 7.0 x 8.9 cm transverse. The distal small bowel is decompressed. No colonic abnormalities are seen. There is a small periampullary duodenal diverticulum. Vascular/Lymphatic: There are no enlarged  abdominal or pelvic lymph nodes. Diffuse atherosclerosis of the aorta and its branches. TIPS extends from the  portal vein to the junction of the IVC with the right hepatic vein. Patency not addressed by this examination. No evidence of surrounding hematoma. Probable upper abdominal collateral vessels/varices. Reproductive: Mild enlargement of the prostate gland which demonstrates dystrophic calcifications. The seminal vesicles appear unremarkable. Other: As above, umbilical hernia containing small bowel. There is a moderate amount of ascites, with most of the fluid in the pelvis. Musculoskeletal: No acute or significant osseous findings. IMPRESSION: 1. No suspicious pulmonary nodule. There is a small calcified granuloma in the lingula. 2. Umbilical hernia containing fluid-filled small bowel. There is proximal small bowel dilatation with air-fluid levels, supporting the presence of an incarcerated hernia. 3. Moderate ascites, attributed to underlying liver disease. 4. T12 superior endplate compression fracture with osseous retropulsion, potentially subacute. 5. Indeterminate 3.1 cm left renal lesion, measuring higher than water density. Statistically, this is a complex cyst. 6. Additional incidental findings including emphysema, bilateral rib fractures, hepatic steatosis, diffuse atherosclerosis and bladder wall thickening. Electronically Signed: By: Carey Bullocks M.D. On: 03/24/2016 16:07   Dg Chest 2 View  Result Date: 03/24/2016 CLINICAL DATA:  Pre op today for TIPS,copd,liver dz,htn,,weakness today EXAM: CHEST  2 VIEW COMPARISON:  09/05/2015 FINDINGS: Lungs are hyperinflated. Heart size is normal. No focal consolidations or pleural effusions are identified. Overlying the left mid lung zone there is a 9 mm nodule. Remote rib fractures. IMPRESSION: 1. 9 mm nodule overlying the left mid lung zone. As needed, further characterization can be performed with CT of the chest. Intravenous contrast is recommended  unless it is contraindicated. 2. No acute consolidation. These results will be called to the ordering clinician or representative by the Radiologist Assistant, and communication documented in the PACS or zVision Dashboard. Electronically Signed   By: Norva Pavlov M.D.   On: 03/24/2016 08:51   Ct Chest Wo Contrast  Addendum Date: 03/24/2016   ADDENDUM REPORT: 03/24/2016 16:37 ADDENDUM: These results were called by telephone at the time of interpretation on 03/24/2016 at 4:20 pm to Dr. Claud Kelp, who verbally acknowledged these results. Electronically Signed   By: Carey Bullocks M.D.   On: 03/24/2016 16:37   Result Date: 03/24/2016 CLINICAL DATA:  Incarcerated umbilical hernia. Left lung nodule. Post TIPS procedure today. EXAM: CT CHEST, ABDOMEN AND PELVIS WITHOUT CONTRAST TECHNIQUE: Multidetector CT imaging of the chest, abdomen and pelvis was performed following the standard protocol without IV contrast. COMPARISON:  Chest radiographs 03/24/2016. FINDINGS: CT CHEST FINDINGS Cardiovascular: Atherosclerosis of the aorta, great vessels and coronary arteries. Right IJ central venous catheter extends into the superior aspect of the right atrium. The heart size is normal. There is no pericardial effusion. Mediastinum/Nodes: There are no enlarged mediastinal, hilar or axillary lymph nodes. Endotracheal and nasogastric tubes are in place. There is possible wall thickening of the distal esophagus. The thyroid gland and trachea demonstrate no significant findings. Lungs/Pleura: There is no pleural effusion. Mild emphysema. There is a small calcified granuloma in the lingula on image 70 which may account for the radiographic finding. No suspicious pulmonary nodules. There is no confluent airspace opacity or pneumothorax. Musculoskeletal/Chest wall: Bilateral gynecomastia noted. Old rib fractures are present bilaterally. Fractures of the left eighth and ninth ribs may be incompletely healed. There is a  fracture of the T12 vertebral body with associated mild osseous retropulsion. This is not clearly seen on chest radiographs from April and could be subacute in age. CT ABDOMEN AND PELVIS FINDINGS Hepatobiliary: Diffuse hepatic steatosis. No focal hepatic abnormalities are seen on noncontrast  imaging. There is high-density material layering within the gallbladder lumen. This is likely due to a combination of gallstones and vicarious excretion of contrast. No gallbladder wall thickening or significant biliary dilatation seen. Pancreas: Unremarkable. No pancreatic ductal dilatation or surrounding inflammatory changes. Spleen: Normal in size without focal abnormality. Adrenals/Urinary Tract: Both adrenal glands appear normal. There is contrast material within the renal collecting systems bilaterally, attributed to previous interventional procedure. Low-density renal lesions are present bilaterally, likely cysts. One in the lower pole of the left kidney measures 3.1 cm and 21 HU. No evidence of hydronephrosis. Bladder is partially decompressed by a Foley catheter. There is bladder wall thickening. Stomach/Bowel: The stomach is decompressed by a nasogastric tube. The proximal small bowel is fluid filled with scattered air-fluid levels and mild dilatation. There is an umbilical hernia containing small bowel, measuring up to 7.0 x 8.9 cm transverse. The distal small bowel is decompressed. No colonic abnormalities are seen. There is a small periampullary duodenal diverticulum. Vascular/Lymphatic: There are no enlarged abdominal or pelvic lymph nodes. Diffuse atherosclerosis of the aorta and its branches. TIPS extends from the portal vein to the junction of the IVC with the right hepatic vein. Patency not addressed by this examination. No evidence of surrounding hematoma. Probable upper abdominal collateral vessels/varices. Reproductive: Mild enlargement of the prostate gland which demonstrates dystrophic calcifications. The  seminal vesicles appear unremarkable. Other: As above, umbilical hernia containing small bowel. There is a moderate amount of ascites, with most of the fluid in the pelvis. Musculoskeletal: No acute or significant osseous findings. IMPRESSION: 1. No suspicious pulmonary nodule. There is a small calcified granuloma in the lingula. 2. Umbilical hernia containing fluid-filled small bowel. There is proximal small bowel dilatation with air-fluid levels, supporting the presence of an incarcerated hernia. 3. Moderate ascites, attributed to underlying liver disease. 4. T12 superior endplate compression fracture with osseous retropulsion, potentially subacute. 5. Indeterminate 3.1 cm left renal lesion, measuring higher than water density. Statistically, this is a complex cyst. 6. Additional incidental findings including emphysema, bilateral rib fractures, hepatic steatosis, diffuse atherosclerosis and bladder wall thickening. Electronically Signed: By: Carey Bullocks M.D. On: 03/24/2016 16:07   US Renal  Result Date: 03/25/2016 CLINICAL DATA:  Urinary retention.  Cirrhosis of liver. EXAM: RENAL / URINARY TRACT ULTRASOUND COMPLETE COMPARISON:  Ultrasound 03/22/2016. FINDINGS: Right Kidney: Length: 11.5 cm. Echogenicity within normal limits. No mass or hydronephrosis visualized. Left Kidney: Length: 13.0 cm. 3 cm left lower pole renal cyst. Echogenicity within normal limits. No mass or hydronephrosis visualized. Bladder: The urinary bladder empty.  Foley catheter in the bladder Mild ascites. IMPRESSION: No renal obstruction.  Urinary bladder empty with Foley catheter Mild ascites, significantly improved from ultrasound of 03/22/2016 Electronically Signed   By: Marlan Palau M.D.   On: 03/25/2016 07:54   US Paracentesis  Result Date: 03/22/2016 INDICATION: History of alcoholic cirrhosis with recurrent ascites. Request for therapeutic paracentesis EXAM: ULTRASOUND GUIDED RIGHT LATERAL ABDOMEN PARACENTESIS  MEDICATIONS: 1% Lidocaine. COMPLICATIONS: None immediate. PROCEDURE: Informed written consent was obtained from the patient after a discussion of the risks, benefits and alternatives to treatment. A timeout was performed prior to the initiation of the procedure. Initial ultrasound scanning demonstrates a large amount of ascites within the right lateral abdomen. The right lateral abdomen was prepped and draped in the usual sterile fashion. 1% lidocaine with epinephrine was used for local anesthesia. Following this, a 19 gauge, 7-cm, Yueh catheter was introduced. An ultrasound image was saved for documentation purposes. The paracentesis  was performed. The catheter was removed and a dressing was applied. The patient tolerated the procedure well without immediate post procedural complication. FINDINGS: A total of approximately 2.4 liters of amber fluid was removed. IMPRESSION: Successful ultrasound-guided paracentesis yielding 2.4 liters of peritoneal fluid. Read by:  Corrin Parker, PA-C Electronically Signed   By: Jolaine Click M.D.   On: 03/22/2016 15:18   US Paracentesis  Result Date: 03/13/2016 INDICATION: Current ascites secondary to alcoholic cirrhosis. Request is made for therapeutic paracentesis with a limit of 4 L and albumin postprocedure. EXAM: ULTRASOUND GUIDED THERAPEUTIC PARACENTESIS MEDICATIONS: 1% lidocaine COMPLICATIONS: None immediate. PROCEDURE: Informed written consent was obtained from the patient after a discussion of the risks, benefits and alternatives to treatment. A timeout was performed prior to the initiation of the procedure. Initial ultrasound scanning demonstrates a large amount of ascites within the right lower abdominal quadrant. The right lower abdomen was prepped and draped in the usual sterile fashion. 1% lidocaine was used for local anesthesia. Following this, a 19 gauge, 7-cm, Yueh catheter was introduced. An ultrasound image was saved for documentation purposes. The paracentesis  was performed. The catheter was removed and a dressing was applied. The patient tolerated the procedure well without immediate post procedural complication. FINDINGS: A total of approximately 4 L of yellow fluid was removed. IMPRESSION: Successful ultrasound-guided paracentesis yielding 4 liters of peritoneal fluid. Read by: Barnetta Chapel, PA-C Electronically Signed   By: Irish Lack M.D.   On: 03/13/2016 11:16   US Paracentesis  Result Date: 03/07/2016 INDICATION: Recurrent ascites EXAM: ULTRASOUND-GUIDED PARACENTESIS COMPARISON:  Previous paracentesis MEDICATIONS: 10 cc 1% lidocaine COMPLICATIONS: None immediate. TECHNIQUE: Informed written consent was obtained from the patient after a discussion of the risks, benefits and alternatives to treatment. A timeout was performed prior to the initiation of the procedure. Initial ultrasound scanning demonstrates a large amount of ascites within the right lower abdominal quadrant. The right lower abdomen was prepped and draped in the usual sterile fashion. 1% lidocaine with epinephrine was used for local anesthesia. Under direct ultrasound guidance, a 19 gauge, 7-cm, Yueh catheter was introduced. An ultrasound image was saved for documentation purposed. The paracentesis was performed. The catheter was removed and a dressing was applied. The patient tolerated the procedure well without immediate post procedural complication. FINDINGS: A total of approximately 4 liters of amber fluid was removed. 4 Liter max per MD. Distended bladder again noted on limited US Abdomen; pt is asymptomatic IMPRESSION: Successful ultrasound-guided paracentesis yielding 4 liters of peritoneal fluid. Read by Robet Leu [facility] Electronically Signed   By: Corlis Leak M.D.   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 Recurrent large volume ascites requiring nearly weekly paracentesis. See previous consultation. EXAM:  TRANSJUGULAR INTRAHEPATIC PORTOSYSTEMIC SHUNT MEDICATIONS: As antibiotic prophylaxis, cefazolin 2 g was ordered pre-procedure and administered intravenously within one hour of incision. ANESTHESIA/SEDATION: General - as administered by the Anesthesia department CONTRAST:  80 mL Isovue FLUOROSCOPY TIME:  7 minutes 18 seconds, 895 mGy COMPLICATIONS: None immediate. PROCEDURE: Informed written consent was obtained from the patient after a thorough discussion of the procedural risks, benefits and alternatives. All questions were addressed. Maximal Sterile Barrier Technique was utilized including caps, mask, sterile gowns, sterile gloves, sterile drape, hand hygiene and skin antiseptic. A timeout was performed prior to the initiation of the procedure. Under ultrasound guidance, a 21 gauge micropuncture needle was advanced percutaneously into a right portal vein branch.  A 018 guidewire was advanced into the main portal vein. 3French micro dilator was used for contrast injection to confirm portal venous positioning. A 5 French micropuncture dilator was placed with Touhy attachment. Confirmatory portal venography performed. Under ultrasound guidance, the right IJ vein was localized. The vessel was accessed with a 21-gauge micropuncture needle, exchanged over a 018 guidewire for a transitional dilator, through which a 035 guidewire was advanced. After tract dilatation, a 10 French curved vascular sheath was placed, advanced into the IVC. Through this, a 5 French angled angiographic catheter was advanced and used to catheterize the right hepatic vein. Confirmatory right hepatic venography performed. Portal and hepatic venous pressure measurements were obtained, demonstrating 14mmHg mean portosystemic gradient. The 018 guidewire was positioned with its transition point at the confluence of anterior and posterior right portal vein branches as a target. The Colapinto needle and sheath were advanced through the primary sheath,  and the needle advanced into the right portal vein branch under biplane fluoroscopy guidance. After confirmatory contrast injection, an angled stiff glidewire was advanced into the splenic vein. The needle was exchanged for the 5 French angiographic catheter, advanced into the splenic vein for confirmatory portal venography. This demonstrated multiple esophageal varices and slow antegrade flow through the portal venous system. Measuring pigtail catheter advanced for planning portal venogram. This was exchanged for a a 7 mm Mustang angioplasty catheter used to dilate the parenchymal tract. This allowed advancement of the 10 French sheath into the portal vein. The 10 mm x 8 cm ViaTorr stent was advanced and deployed from right portal vein to right hepatic vein. The stent was dilated with a 10 mm angioplasty balloon. Follow-up pigtail catheter portal venogram demonstrates brisk antegrade flow through the TIPS, much less filling of a variceal channels, and minimal antegrade flow into intrahepatic portal vein branches. There is a nonocclusive mural thrombus near the portosplenic confluence. Pressure measurements reveal a 6 mm Hg portosystemic gradient. The pigtail catheter was withdrawn. The sheath was exchanged for a 25 cm 10 French sheath left with the tip at the SVC/RA junction as venous access. The sheath was secured externally with 0 Prolene suture and placed to a saline flush. The patient tolerated the procedure well. He was transferred to CT for previously planned imaging. IMPRESSION: 1. 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 Outpatient follow-up with a TIPS ultrasound in 1 month. Electronically Signed   By: Corlis Leak  Hassell M.D.   On: 03/24/2016 16:37   Dg Chest Port 1 View  Result Date: 03/24/2016 CLINICAL DATA:  Postop, line  placement EXAM: PORTABLE CHEST 1 VIEW COMPARISON:  03/24/2016 FINDINGS: Endotracheal tube is 5 cm above the carina. NG tube enters the stomach. Right internal jugular vascular sheath in place with the tip in the upper right atrium. No pneumothorax. Heart is normal size. No confluent airspace opacities or effusions. No acute bony abnormality. Old left rib fractures again noted, unchanged. IMPRESSION: No active disease. Electronically Signed   By: Charlett NoseKevin  Dover M.D.   On: 03/24/2016 16:48     PERTINENT LAB RESULTS: CBC:  Recent Labs  03/30/16 0725  WBC 9.5  HGB 13.1  HCT 39.0  PLT 69*   CMET CMP     Component Value Date/Time   NA 135 03/30/2016 0725   K 3.4 (L) 03/30/2016 0725   CL 103 03/30/2016 0725   CO2 27  03/30/2016 0725   GLUCOSE 89 03/30/2016 0725   BUN 8 03/30/2016 0725   CREATININE 0.60 (L) 03/30/2016 0725   CREATININE 0.90 10/08/2015 1306   CALCIUM 7.7 (L) 03/30/2016 0725   PROT 4.4 (L) 03/30/2016 0725   ALBUMIN 1.9 (L) 03/30/2016 0725   AST 44 (H) 03/30/2016 0725   ALT 29 03/30/2016 0725   ALKPHOS 86 03/30/2016 0725   BILITOT 2.3 (H) 03/30/2016 0725   GFRNONAA >60 03/30/2016 0725   GFRNONAA >89 10/08/2015 1306   GFRAA >60 03/30/2016 0725   GFRAA >89 10/08/2015 1306    GFR Estimated Creatinine Clearance: 107 mL/min (by C-G formula based on SCr of 0.6 mg/dL (L)). No results for input(s): LIPASE, AMYLASE in the last 72 hours. No results for input(s): CKTOTAL, CKMB, CKMBINDEX, TROPONINI in the last 72 hours. Invalid input(s): POCBNP No results for input(s): DDIMER in the last 72 hours. No results for input(s): HGBA1C in the last 72 hours. No results for input(s): CHOL, HDL, LDLCALC, TRIG, CHOLHDL, LDLDIRECT in the last 72 hours. No results for input(s): TSH, T4TOTAL, T3FREE, THYROIDAB in the last 72 hours.  Invalid input(s): FREET3 No results for input(s): VITAMINB12, FOLATE, FERRITIN, TIBC, IRON, RETICCTPCT in the last 72 hours. Coags: No results for  input(s): INR in the last 72 hours.  Invalid input(s): PT Microbiology: Recent Results (from the past 240 hour(s))  Culture, blood (routine x 2)     Status: None   Collection Time: 03/24/16  5:36 PM  Result Value Ref Range Status   Specimen Description BLOOD RIGHT HAND  Final   Special Requests BOTTLES DRAWN AEROBIC AND ANAEROBIC 5CC  Final   Culture NO GROWTH 5 DAYS  Final   Report Status 03/29/2016 FINAL  Final  Culture, blood (routine x 2)     Status: None   Collection Time: 03/24/16  5:36 PM  Result Value Ref Range Status   Specimen Description BLOOD A-LINE  Final   Special Requests BOTTLES DRAWN AEROBIC AND ANAEROBIC 5CC  Final   Culture NO GROWTH 5 DAYS  Final   Report Status 03/29/2016 FINAL  Final  Surgical PCR screen     Status: None   Collection Time: 03/27/16 10:30 AM  Result Value Ref Range Status   MRSA, PCR NEGATIVE NEGATIVE Final   Staphylococcus aureus NEGATIVE NEGATIVE Final    Comment:        The Xpert SA Assay (FDA approved for NASAL specimens in patients over 43 years of age), is one component of a comprehensive surveillance program.  Test performance has been validated by [facility] for patients greater than or equal to 14 year old. It is not intended to diagnose infection nor to guide or monitor treatment.     FURTHER DISCHARGE INSTRUCTIONS:  Get Medicines reviewed and adjusted: Please take all your medications with you for your next visit with your Primary MD  Laboratory/radiological data: Please request your Primary MD to go over all hospital tests and procedure/radiological results at the follow up, please ask your Primary MD to get all Hospital records sent to his/her office.  In some cases, they will be blood work, cultures and biopsy results pending at the time of your discharge. Please request that your primary care M.D. goes through all the records of your hospital data and follows up on these results.  Also Note the following: If you  experience worsening of your admission symptoms, develop shortness of breath, life threatening emergency, suicidal or homicidal thoughts you must seek medical  attention immediately by calling 911 or calling your MD immediately  if symptoms less severe.  You must read complete instructions/literature along with all the possible adverse reactions/side effects for all the Medicines you take and that have been prescribed to you. Take any new Medicines after you have completely understood and accpet all the possible adverse reactions/side effects.   Do not drive when taking Pain medications or sleeping medications (Benzodaizepines)  Do not take more than prescribed Pain, Sleep and Anxiety Medications. It is not advisable to combine anxiety,sleep and pain medications without talking with your primary care practitioner  Special Instructions: If you have smoked or chewed Tobacco  in the last 2 yrs please stop smoking, stop any regular Alcohol  and or any Recreational drug use.  Wear Seat belts while driving.  Please note: You were cared for by a hospitalist during your hospital stay. Once you are discharged, your primary care physician will handle any further medical issues. Please note that NO REFILLS for any discharge medications will be authorized once you are discharged, as it is imperative that you return to your primary care physician (or establish a relationship with a primary care physician if you do not have one) for your post hospital discharge needs so that they can reassess your need for medications and monitor your lab values.  Total Time spent coordinating discharge including counseling, education and face to face time equals  45 minutes.  SignedJeoffrey Massed: Dynastee Brummell 04/01/2016 11:22 AM

## 2016-04-01 NOTE — Progress Notes (Signed)
Patient discharge teaching given, including activity, diet, follow-up appoints, and medications. Patient verbalized understanding of all discharge instructions. IV access was d/c'd. Vitals are stable. Skin is intact except as charted in most recent assessments. Pt to be escorted out by NT, to be driven home by family. 

## 2016-04-03 ENCOUNTER — Other Ambulatory Visit: Payer: Self-pay | Admitting: Family Medicine

## 2016-04-03 ENCOUNTER — Telehealth: Payer: Self-pay

## 2016-04-03 ENCOUNTER — Other Ambulatory Visit (HOSPITAL_COMMUNITY): Payer: Self-pay | Admitting: Interventional Radiology

## 2016-04-03 DIAGNOSIS — K7031 Alcoholic cirrhosis of liver with ascites: Secondary | ICD-10-CM

## 2016-04-03 DIAGNOSIS — K7460 Unspecified cirrhosis of liver: Secondary | ICD-10-CM

## 2016-04-03 DIAGNOSIS — K769 Liver disease, unspecified: Secondary | ICD-10-CM

## 2016-04-03 DIAGNOSIS — R188 Other ascites: Secondary | ICD-10-CM

## 2016-04-03 NOTE — Telephone Encounter (Signed)
[facility] calling to update MD regarding patient's condition.  He was released from the hospital on Saturday .  He will have skilled nursing twice weekly for 3 weeks and a HHA 5 times weekly for 3 weeks.  Patient [facility].  He does not have any of these medications at this time.

## 2016-04-03 NOTE — Telephone Encounter (Signed)
Everything was refilled at his last visit. He just needs to pick them up.

## 2016-04-05 ENCOUNTER — Ambulatory Visit (HOSPITAL_COMMUNITY): Admission: RE | Admit: 2016-04-05 | Payer: Medicaid Other | Source: Ambulatory Visit

## 2016-04-05 ENCOUNTER — Other Ambulatory Visit: Payer: Self-pay | Admitting: Family Medicine

## 2016-04-05 DIAGNOSIS — F32A Depression, unspecified: Secondary | ICD-10-CM

## 2016-04-05 DIAGNOSIS — K219 Gastro-esophageal reflux disease without esophagitis: Secondary | ICD-10-CM

## 2016-04-05 DIAGNOSIS — F329 Major depressive disorder, single episode, unspecified: Secondary | ICD-10-CM

## 2016-04-05 MED FILL — FUROSEMIDE 20 MG TABLET: 20 | 30 days supply | Qty: 30 | Fill #0

## 2016-04-05 MED FILL — POTASSIUM CL ER 20 MEQ TAB: 20 | 30 days supply | Qty: 30 | Fill #1

## 2016-04-05 MED FILL — VOLTAREN 1% GEL: 1 | 20 days supply | Qty: 100 | Fill #0

## 2016-04-05 MED FILL — raNITIdine HCL 150 MG TABS: 150 | 30 days supply | Qty: 60 | Fill #0

## 2016-04-05 MED FILL — SPIRONOLACTONE 50 MG TABLET: 50 | 30 days supply | Qty: 30 | Fill #2

## 2016-04-05 MED FILL — CITALOPRAM HBR 20 MG TABLET: 20 | 30 days supply | Qty: 30 | Fill #0

## 2016-04-11 ENCOUNTER — Encounter: Payer: Self-pay | Admitting: Licensed Clinical Social Worker

## 2016-04-11 ENCOUNTER — Ambulatory Visit: Payer: Medicaid Other | Attending: Internal Medicine | Admitting: Physician Assistant

## 2016-04-11 VITALS — BP 104/65 | HR 90 | Temp 98.0°F | Resp 20 | Wt 186.0 lb

## 2016-04-11 DIAGNOSIS — J9611 Chronic respiratory failure with hypoxia: Secondary | ICD-10-CM

## 2016-04-11 DIAGNOSIS — J449 Chronic obstructive pulmonary disease, unspecified: Secondary | ICD-10-CM | POA: Diagnosis not present

## 2016-04-11 DIAGNOSIS — K7031 Alcoholic cirrhosis of liver with ascites: Secondary | ICD-10-CM | POA: Insufficient documentation

## 2016-04-11 DIAGNOSIS — E876 Hypokalemia: Secondary | ICD-10-CM

## 2016-04-11 DIAGNOSIS — K219 Gastro-esophageal reflux disease without esophagitis: Secondary | ICD-10-CM | POA: Insufficient documentation

## 2016-04-11 DIAGNOSIS — Z9889 Other specified postprocedural states: Secondary | ICD-10-CM

## 2016-04-11 DIAGNOSIS — F339 Major depressive disorder, recurrent, unspecified: Secondary | ICD-10-CM

## 2016-04-11 DIAGNOSIS — F329 Major depressive disorder, single episode, unspecified: Secondary | ICD-10-CM | POA: Diagnosis not present

## 2016-04-11 DIAGNOSIS — F17210 Nicotine dependence, cigarettes, uncomplicated: Secondary | ICD-10-CM | POA: Insufficient documentation

## 2016-04-11 DIAGNOSIS — Z8719 Personal history of other diseases of the digestive system: Secondary | ICD-10-CM

## 2016-04-11 DIAGNOSIS — I10 Essential (primary) hypertension: Secondary | ICD-10-CM | POA: Diagnosis not present

## 2016-04-11 LAB — CMP AND LIVER
ALBUMIN: 2.4 g/dL — AB (ref 3.6–5.1)
ALK PHOS: 104 U/L (ref 40–115)
ALT: 12 U/L (ref 9–46)
AST: 30 U/L (ref 10–35)
BUN: 7 mg/dL (ref 7–25)
Bilirubin, Direct: 0.5 mg/dL — ABNORMAL HIGH (ref <=0.2)
CALCIUM: 7.8 mg/dL — AB (ref 8.6–10.3)
CHLORIDE: 104 mmol/L (ref 98–110)
CO2: 27 mmol/L (ref 20–31)
CREATININE: 0.43 mg/dL — AB (ref 0.70–1.33)
GLUCOSE: 91 mg/dL (ref 65–99)
Indirect Bilirubin: 0.8 mg/dL (ref 0.2–1.2)
POTASSIUM: 4.1 mmol/L (ref 3.5–5.3)
SODIUM: 136 mmol/L (ref 135–146)
Total Bilirubin: 1.3 mg/dL — ABNORMAL HIGH (ref 0.2–1.2)
Total Protein: 5.6 g/dL — ABNORMAL LOW (ref 6.1–8.1)

## 2016-04-11 LAB — CBC
HEMATOCRIT: 37.5 % — AB (ref 38.5–50.0)
HEMOGLOBIN: 12.8 g/dL — AB (ref 13.2–17.1)
MCH: 32.3 pg (ref 27.0–33.0)
MCHC: 34.1 g/dL (ref 32.0–36.0)
MCV: 94.7 fL (ref 80.0–100.0)
MPV: 9.9 fL (ref 7.5–12.5)
Platelets: 153 10*3/uL (ref 140–400)
RBC: 3.96 MIL/uL — ABNORMAL LOW (ref 4.20–5.80)
RDW: 15.3 % — ABNORMAL HIGH (ref 11.0–15.0)
WBC: 7.7 10*3/uL (ref 3.8–10.8)

## 2016-04-11 MED ORDER — ALBUTEROL SULFATE HFA 108 (90 BASE) MCG/ACT IN AERS: 2.0000 | INHALATION_SPRAY | Freq: Four times a day (QID) | RESPIRATORY_TRACT | 0 refills | Status: DC | PRN

## 2016-04-11 MED ORDER — CITALOPRAM HYDROBROMIDE 20 MG PO TABS: 20.0000 mg | ORAL_TABLET | Freq: Every day | ORAL | 0 refills | Status: DC

## 2016-04-11 MED ORDER — FUROSEMIDE 20 MG PO TABS: 20.0000 mg | ORAL_TABLET | Freq: Every day | ORAL | 0 refills | Status: DC

## 2016-04-11 MED ORDER — SPIRONOLACTONE 50 MG PO TABS: 50.0000 mg | ORAL_TABLET | Freq: Every day | ORAL | 0 refills | Status: DC

## 2016-04-11 MED ORDER — RANITIDINE HCL 150 MG PO TABS
150.0000 mg | ORAL_TABLET | Freq: Two times a day (BID) | ORAL | 0 refills | Status: DC
Start: 2016-04-11 — End: 2016-05-09

## 2016-04-11 MED ORDER — POTASSIUM CHLORIDE CRYS ER 20 MEQ PO TBCR: 20.0000 meq | EXTENDED_RELEASE_TABLET | Freq: Every day | ORAL | 0 refills | Status: DC

## 2016-04-11 MED ORDER — LACTULOSE 10 GM/15ML PO SOLN: 20.0000 g | Freq: Three times a day (TID) | ORAL | 0 refills | Status: DC

## 2016-04-11 MED FILL — PROAIR HFA 90 MCG INHALER: 108 (90 BAS | 25 days supply | Qty: 9 | Fill #1

## 2016-04-11 NOTE — Addendum Note (Signed)
Addended byVivianne Master: Amos Micheals S on: 04/11/2016 09:56 AM   Modules accepted: Orders

## 2016-04-11 NOTE — Progress Notes (Signed)
Needs appointment for paracentesis. Needs refill for albuterol.

## 2016-04-11 NOTE — BH Specialist Note (Signed)
Session Start time: 9:45 am   End Time: 10:00 am Total Time:  15 minutes Type of Service: Behavioral Health - Individual/Family Interpreter: No.   Interpreter Name & Language: N/A # [facility]   SUBJECTIVE: Bryce MawRobert [name] is a 55 y.o. male  Pt. was referred by Zenda AlpersPA-C Noel for:  anxiety and depression. Pt. reports the following symptoms/concerns: difficulty sleeping, withdrawn behavior, irritability Duration of problem:  "pretty much all my life" Severity: severe Previous treatment: Pt has participated in therapy and support groups in the past. He reported that he becomes upset easily with others and isn't interested in psychotherapy at this time. Pt currently not receiving services; however, is compliant with prescription medication.   OBJECTIVE: Mood: Depressed & Affect: Appropriate Risk of harm to self or others: Pt denied SI/HI Assessments administered: PHQ-9; GAD-7  LIFE CONTEXT:  Family & Social: Pt resides with brother, who provides emotional support. No additional form of support reported School/ Work: Pt is unemployed. He receives SSI ($450) and FS ($66). Pt has applied for disability (pending) Self-Care: Pt has difficulty sleeping. No appetite concerns. Pt smokes cigarettes occasionally. 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 Life changes: Pt recently hospitalized for medical conditions What is important to pt/family (values): Family   GOALS ADDRESSED:  Decrease symptoms of depression Decrease symptoms of anxiety  INTERVENTIONS: Motivational Interviewing, Strength-based and Supportive   ASSESSMENT:  Pt currently experiencing depression and anxiety. Pt reports difficulty sleeping, withdrawn behavior, and irritability. Pt may benefit from psychotherapy and medication management. LCSWA educated pt on the cycle of depression and the importance of implementing healthy coping strategies to decrease symptoms of depression. Pt identified  a coping skill and agreed to implement on a weekly basis. LCSWA encouraged pt initiate behavioral health services; however, pt is not interested at this time.      PLAN: 1. F/U with behavioral health clinician: Pt was encouraged to contact LCSWA if symptoms worsen or fail to improve to [facility]. 2. Behavioral Health meds: Celexa and Ativan 3. Behavioral recommendations: LCSWA recommends that pt apply healthy coping skills discussed. Pt is encouraged to schedule follow up appointment with LCSWA 4. Referral: Brief Counseling/Psychotherapy, Psychoeducation and Supportive Counseling 5. From scale of 1-10, how likely are you to follow plan: 5/10   Bridgett LarssonJasmine D Lewis, MSW, LCSWA  Clinical Social Worker 04/11/16 11:17 am  Warmhandoff:   Warm Hand Off Completed.

## 2016-04-11 NOTE — Patient Instructions (Addendum)
Paracentesis scheduled for Wednesday-November 15 th, 2017 at 10 am [facility] arrival time: 9:45am.

## 2016-04-11 NOTE — Progress Notes (Addendum)
[name]  ZOX:[id number]  [id number]  DOB - 1960/06/01  Chief Complaint  Patient presents with  . Hospitalization Follow-up       Subjective:   [name] is a 55 y.o. male here today for post hospitalization follow up. He was hospitalized from 03/24/16-04/01/16 for TIPS procedure. An incarcerated umbilical hernia was found and then on 03/25/16 he had surgical repair. His post op course was complicated by AKI, VDRF, septic shock, encephalopathy and generalized deconditioning. PT recommended SNF but pt refused. He receives HHRN/PT as ordered.  Since discharge he missed at least one of his weekly paracentesis appts. He feels "full". No pain. No CP. Freq N/V. No syncope or palps. No change in breathing and continues to smoke.     ROS: GEN: denies fever or chills, denies change in weight Skin: denies lesions or rashes HEENT: denies headache, earache, epistaxis, sore throat, or neck pain LUNGS: denies SHOB, dyspnea, PND, orthopnea CV: denies CP or palpitations ABD: + abd pain, N or V EXT: denies muscle spasms +swelling; no pain in lower ext, no weakness NEURO: denies numbness or tingling, denies sz, stroke or TIA  ALLERGIES: No Known Allergies  PAST MEDICAL HISTORY: Past Medical History:  Diagnosis Date  . Cirrhosis of liver (HCC)   . Constipation   . COPD (chronic obstructive pulmonary disease) (HCC)   . Depression   . Dyspnea   . ETOH abuse   . GERD (gastroesophageal reflux disease)   . Hypertension     PAST SURGICAL HISTORY: Past Surgical History:  Procedure Laterality Date  . IR GENERIC HISTORICAL  03/24/2016   IR TIPS 03/24/2016 [name], MD [facility]INTER[facility]  . MANDIBLE FRACTURE SURGERY  30 yrs. ago   pt. states he has a steele plate  . NO PAST SURGERIES     jaw surgery 654yrs ago  . RADIOLOGY WITH ANESTHESIA N/A 03/24/2016   Procedure: TIPS;  Surgeon: [name], MD;  [facility];  Service: Radiology;  Laterality: N/A;  . UMBILICAL HERNIA  REPAIR N/A 03/25/2016   Procedure: HERNIA REPAIR UMBILICAL INCARCERATED;  Surgeon: [name], MD;  Location: [facility] OR;  Service: General;  Laterality: N/A;    MEDICATIONS AT HOME: Prior to Admission medications   Medication Sig Start Date End Date Taking? Authorizing Provider  albuterol (PROVENTIL HFA;VENTOLIN HFA) 108 (90 Base) MCG/ACT inhaler Inhale 2 puffs into the lungs every 6 (six) hours as needed for wheezing or shortness of breath. 04/01/16  Yes [name], MD  feeding supplement, ENSURE ENLIVE, (ENSURE ENLIVE) LIQD Take 237 mLs by mouth 3 (three) times daily between meals. 04/01/16  Yes [name], MD  furosemide (LASIX) 20 MG tablet Take 1 tablet (20 mg total) by mouth daily. 04/11/16  Yes [name], PA-C  lactulose (CHRONULAC) 10 GM/15ML solution Take 30 mLs (20 g total) by mouth 3 (three) times daily. 04/11/16  Yes [name], PA-C  potassium chloride SA (K-DUR,KLOR-CON) 20 MEQ tablet Take 1 tablet (20 mEq total) by mouth daily. 04/11/16  Yes [name], PA-C  ranitidine (ZANTAC) 150 MG tablet Take 1 tablet (150 mg total) by mouth 2 (two) times daily. 04/11/16  Yes [name], PA-C  spironolactone (ALDACTONE) 50 MG tablet Take 1 tablet (50 mg total) by mouth daily. 04/11/16  Yes [name], PA-C  VOLTAREN 1 % GEL APPLY 4 GRAMS TOPICALLY 4 TIMES DAILY. 04/05/16  Yes [name], MD  ciprofloxacin (CIPRO) 500 MG tablet Take 1 tablet (500  mg total) by mouth once a week. Patient not taking: Reported on 04/11/2016 04/01/16   [name], MD  citalopram (CELEXA) 20 MG tablet Take 1 tablet (20 mg total) by mouth daily. 04/11/16   [name], PA-C  diclofenac sodium (VOLTAREN) 1 % GEL Apply 4 g topically 4 (four) times daily as needed (for pain). 04/01/16   [name], MD     Objective:   Vitals:   04/11/16 0908  BP: 104/65  Pulse: 90  Resp: 20  Temp: 98 F (36.7 C)  TempSrc: Oral  SpO2: (!) 65%  Weight: 186 lb (84.4 kg)     Exam General appearance : Awake, alert, not in any distress. Speech Clear. Not toxic looking HEENT: Atraumatic and Normocephalic, pupils equally reactive to light and accomodation Neck: supple, no JVD. No cervical lymphadenopathy.  Chest:Good air entry bilaterally, no added sounds  CVS: S1 S2 regular, no murmurs.  Abdomen: +BS, distended, firm Extremities: B/L Lower Ext shows 2+ edema, both legs are warm to touch Neurology: Awake alert, and oriented X 3, CN II-XII intact, Non focal Skin:No Rash Wounds:clean and dry umbilical lesion  Data Review Lab Results  Component Value Date   HGBA1C 4.7 (L) 03/24/2016   HGBA1C 5.0 06/14/2015     Assessment & Plan  1. Alcoholic cirrhosis with ascites  -s/p TIPS on 03/24/16 (has f/u with Dr. Deanne CofferHassell scheduled)  -stat paracentesis  -Cont Lasix, Aldactone, Lactulose  -CMP, CBC today 2. S/p umbilical hernia repair 03/25/16 (Dr. Luisa Hartornett)  -4 week f/u-pt to call  -[facility] etc 3. COPD  -meds refilled  -Smoking cessation discussed-pt not ready to quit 4. Depression  -Celexa refilled  -LCSW has his info   Return in about 1 week (around 04/18/2016).  The patient was given clear instructions to go to ER or return to medical center if symptoms don't improve, worsen or new problems develop. The patient verbalized understanding. The patient was told to call to get lab results if they haven't heard anything in the next week.   This note has been created with Education officer, environmentalDragon speech recognition software and smart phrase technology. Any transcriptional errors are unintentional.    [name], PA-C [facility] [place], [place] [phone]   04/11/2016, 9:39 AM

## 2016-04-12 ENCOUNTER — Ambulatory Visit (HOSPITAL_COMMUNITY)
Admission: RE | Admit: 2016-04-12 | Discharge: 2016-04-12 | Disposition: A | Discharge status: Home / Self Care | Payer: Medicaid Other | Source: Ambulatory Visit | Attending: Physician Assistant | Admitting: Physician Assistant

## 2016-04-12 DIAGNOSIS — K7031 Alcoholic cirrhosis of liver with ascites: Secondary | ICD-10-CM | POA: Diagnosis present

## 2016-04-12 MED ORDER — ALBUMIN HUMAN 25 % IV SOLN
25.0000 g | Freq: Once | INTRAVENOUS | Status: AC
Start: 2016-04-12 — End: 2016-04-12
  Administered 2016-04-12: 25 g via INTRAVENOUS
  Filled 2016-04-12 (×2): qty 100

## 2016-04-12 MED ORDER — LIDOCAINE HCL 1 % IJ SOLN
INTRAMUSCULAR | Status: AC
  Filled 2016-04-12: qty 20

## 2016-04-12 NOTE — Procedures (Signed)
Successful US guided paracentesis from right lateral abdomen.  Yielded 4.6 liters of clear yellow fluid.  No immediate complications.  Pt tolerated well.   Damari Hiltz S Rhylen Shaheen PA-C 04/12/2016 10:50 AM

## 2016-04-14 ENCOUNTER — Ambulatory Visit (HOSPITAL_COMMUNITY): Payer: Medicaid Other

## 2016-04-18 ENCOUNTER — Ambulatory Visit: Payer: Medicaid Other | Attending: Family Medicine | Admitting: Family Medicine

## 2016-04-18 ENCOUNTER — Encounter: Payer: Self-pay | Admitting: Family Medicine

## 2016-04-18 VITALS — BP 98/66 | HR 92 | Temp 97.8°F | Ht 73.0 in | Wt 165.4 lb

## 2016-04-18 DIAGNOSIS — K219 Gastro-esophageal reflux disease without esophagitis: Secondary | ICD-10-CM | POA: Insufficient documentation

## 2016-04-18 DIAGNOSIS — K769 Liver disease, unspecified: Secondary | ICD-10-CM | POA: Diagnosis present

## 2016-04-18 DIAGNOSIS — E43 Unspecified severe protein-calorie malnutrition: Secondary | ICD-10-CM | POA: Diagnosis not present

## 2016-04-18 DIAGNOSIS — R112 Nausea with vomiting, unspecified: Secondary | ICD-10-CM | POA: Diagnosis not present

## 2016-04-18 DIAGNOSIS — K7031 Alcoholic cirrhosis of liver with ascites: Secondary | ICD-10-CM | POA: Insufficient documentation

## 2016-04-18 DIAGNOSIS — F329 Major depressive disorder, single episode, unspecified: Secondary | ICD-10-CM | POA: Insufficient documentation

## 2016-04-18 DIAGNOSIS — K7460 Unspecified cirrhosis of liver: Secondary | ICD-10-CM | POA: Diagnosis not present

## 2016-04-18 DIAGNOSIS — J449 Chronic obstructive pulmonary disease, unspecified: Secondary | ICD-10-CM | POA: Insufficient documentation

## 2016-04-18 DIAGNOSIS — N179 Acute kidney failure, unspecified: Secondary | ICD-10-CM | POA: Diagnosis not present

## 2016-04-18 DIAGNOSIS — K5900 Constipation, unspecified: Secondary | ICD-10-CM | POA: Insufficient documentation

## 2016-04-18 DIAGNOSIS — I10 Essential (primary) hypertension: Secondary | ICD-10-CM | POA: Insufficient documentation

## 2016-04-18 LAB — CBC WITH DIFFERENTIAL/PLATELET
BASOS PCT: 0 %
Basophils Absolute: 0 cells/uL (ref 0–200)
EOS PCT: 2 %
Eosinophils Absolute: 140 cells/uL (ref 15–500)
HCT: 35.4 % — ABNORMAL LOW (ref 38.5–50.0)
Hemoglobin: 11.9 g/dL — ABNORMAL LOW (ref 13.2–17.1)
Lymphocytes Relative: 24 %
Lymphs Abs: 1680 cells/uL (ref 850–3900)
MCH: 31.9 pg (ref 27.0–33.0)
MCHC: 33.6 g/dL (ref 32.0–36.0)
MCV: 94.9 fL (ref 80.0–100.0)
MONOS PCT: 12 %
MPV: 9.2 fL (ref 7.5–12.5)
Monocytes Absolute: 840 cells/uL (ref 200–950)
NEUTROS ABS: 4340 {cells}/uL (ref 1500–7800)
Neutrophils Relative %: 62 %
PLATELETS: 205 10*3/uL (ref 140–400)
RBC: 3.73 MIL/uL — AB (ref 4.20–5.80)
RDW: 15.2 % — ABNORMAL HIGH (ref 11.0–15.0)
WBC: 7 10*3/uL (ref 3.8–10.8)

## 2016-04-18 MED ORDER — ONDANSETRON HCL 4 MG PO TABS: 4.0000 mg | ORAL_TABLET | Freq: Three times a day (TID) | ORAL | 1 refills | Status: DC | PRN

## 2016-04-18 MED FILL — ?ONDANSETRON HCL 4 MG TABLE: 4 | 20 days supply | Qty: 60 | Fill #0

## 2016-04-18 NOTE — Progress Notes (Signed)
Subjective:  Patient ID: [name], male    DOB: [birth year]  Age: 55 y.o. MRN: [id number]  CC: cirrhosis of liver with ascities and Emesis (7-8 days  4-5 times daily)   HPI [name] is a 55 year old male with a history of alcoholic liver cirrhosis with ascites, protein calorie malnutrition hospitalized for an elective TIPS procedure last month  With a post op course complicated by acute renal failure, acute encephalopathy and hypoxic respiratory failure leading to mechanical ventilation in the ICU. During his hospitalization he also underwent umbilical hernia repair.  He was seen by the PA last week for hospital follow-up at which time his labs revealed improvement of renal function back to baseline. Today he informs me that he has persistent nausea and vomiting and has had a reduced appetite. He underwent therapeutic paracentesis of 4.6 L of fluid one week ago and complains today of abdominal distention.  He had his postop visit with the general surgeon yesterday and has an upcoming appointment with interventional radiology in 2 weeks.  Past Medical History:  Diagnosis Date  . Cirrhosis of liver (HCC)   . Constipation   . COPD (chronic obstructive pulmonary disease) (HCC)   . Depression   . Dyspnea   . ETOH abuse   . GERD (gastroesophageal reflux disease)   . Hypertension     Past Surgical History:  Procedure Laterality Date  . IR GENERIC HISTORICAL  03/24/2016   IR TIPS 03/24/2016 [name], MD [facility]  . IR GENERIC HISTORICAL  03/15/2016   IR RADIOLOGIST EVAL & MGMT 03/15/2016 [name], MD [facility] INTERV RAD  . MANDIBLE FRACTURE SURGERY  30 yrs. ago   pt. states he has a steele plate  . NO PAST SURGERIES     jaw surgery 3835yrs ago  . RADIOLOGY WITH ANESTHESIA N/A 03/24/2016   Procedure: TIPS;  Surgeon: [name], MD;  [facility];  Service: Radiology;  Laterality: N/A;  . UMBILICAL HERNIA REPAIR N/A 03/25/2016   Procedure: HERNIA REPAIR  UMBILICAL INCARCERATED;  Surgeon: [name], MD;  Location: [facility] OR;  Service: General;  Laterality: N/A;    No Known Allergies   Outpatient Medications Prior to Visit  Medication Sig Dispense Refill  . albuterol (PROVENTIL HFA;VENTOLIN HFA) 108 (90 Base) MCG/ACT inhaler Inhale 2 puffs into the lungs every 6 (six) hours as needed for wheezing or shortness of breath. 54 g 0  . citalopram (CELEXA) 20 MG tablet Take 1 tablet (20 mg total) by mouth daily. 30 tablet 0  . diclofenac sodium (VOLTAREN) 1 % GEL Apply 4 g topically 4 (four) times daily as needed (for pain). 100 g 0  . feeding supplement, ENSURE ENLIVE, (ENSURE ENLIVE) LIQD Take 237 mLs by mouth 3 (three) times daily between meals. 60 Bottle 0  . furosemide (LASIX) 20 MG tablet Take 1 tablet (20 mg total) by mouth daily. 30 tablet 0  . lactulose (CHRONULAC) 10 GM/15ML solution Take 30 mLs (20 g total) by mouth 3 (three) times daily. 946 mL 0  . potassium chloride SA (K-DUR,KLOR-CON) 20 MEQ tablet Take 1 tablet (20 mEq total) by mouth daily. 30 tablet 0  . VOLTAREN 1 % GEL APPLY 4 GRAMS TOPICALLY 4 TIMES DAILY. 100 g 0  . ranitidine (ZANTAC) 150 MG tablet Take 1 tablet (150 mg total) by mouth 2 (two) times daily. (Patient not taking: Reported on 04/18/2016) 60 tablet 0  . spironolactone (ALDACTONE) 50 MG tablet Take 1 tablet (50 mg total) by mouth  daily. (Patient not taking: Reported on 04/18/2016) 30 tablet 0  . ciprofloxacin (CIPRO) 500 MG tablet Take 1 tablet (500 mg total) by mouth once a week. (Patient not taking: Reported on 04/11/2016) 10 tablet 0   No facility-administered medications prior to visit.     ROS Review of Systems  Constitutional: Positive for appetite change. Negative for activity change.  HENT: Negative for sinus pressure and sore throat.   Eyes: Negative for visual disturbance.  Respiratory: Negative for cough, chest tightness and shortness of breath.   Cardiovascular: Positive for leg swelling. Negative  for chest pain.  Gastrointestinal: Positive for abdominal distention, nausea and vomiting. Negative for abdominal pain, constipation and diarrhea.  Endocrine: Negative.   Genitourinary: Negative for dysuria.  Musculoskeletal: Negative for joint swelling and myalgias.  Skin: Negative for rash.  Allergic/Immunologic: Negative.   Neurological: Negative for weakness, light-headedness and numbness.  Psychiatric/Behavioral: Negative for dysphoric mood and suicidal ideas.    Objective:  BP 98/66 (BP Location: Right Arm, Patient Position: Sitting, Cuff Size: Large)   Pulse 92   Temp 97.8 F (36.6 C) (Oral)   Ht 6\' 1"  (1.854 m)   Wt 165 lb 6.4 oz (75 kg)   SpO2 100%   BMI 21.82 kg/m   BP/Weight 04/18/2016 04/12/2016 04/11/2016  Systolic BP 98 106 104  Diastolic BP 66 62 65  Wt. (Lbs) 165.4 - 186  BMI 21.82 - 24.54  Some encounter information is confidential and restricted. Go to Review Flowsheets activity to see all data.      Physical Exam  Constitutional: He is oriented to person, place, and time.  Malnourished, chronically ill-looking  Cardiovascular: Normal rate, normal heart sounds and intact distal pulses.   No murmur heard. Pulmonary/Chest: Effort normal and breath sounds normal. He has no wheezes. He has no rales. He exhibits no tenderness.  Abdominal: Soft. Bowel sounds are normal. He exhibits distension (mild ascites). He exhibits no mass. There is no tenderness.  Sutures in place at the site of repair of umbilical hernia  Musculoskeletal: Normal range of motion. He exhibits edema (2+ bilateral non pitting pedal edema).  Neurological: He is alert and oriented to person, place, and time.  Skin: Skin is warm and dry.  Psychiatric: He has a normal mood and affect.     Assessment & Plan:   1. Protein-calorie malnutrition, severe This is in the setting of chronic liver disease.  2. Chronic liver disease and cirrhosis (HCC) S/p TIPS Scheduled to see interventional  radiology on 05/03/16 - CBC with Differential/Platelet - US Paracentesis; Future - US Paracentesis; Future - US Paracentesis; Future  3. Acute kidney injury (HCC) Resolved - Comprehensive metabolic panel  4. Non-intractable vomiting with nausea, unspecified vomiting type Most recent blood work negative for uremia - ondansetron (ZOFRAN) 4 MG tablet; Take 1 tablet (4 mg total) by mouth every 8 (eight) hours as needed for nausea or vomiting.  Dispense: 60 tablet; Refill: 1   Meds ordered this encounter  Medications  . ondansetron (ZOFRAN) 4 MG tablet    Sig: Take 1 tablet (4 mg total) by mouth every 8 (eight) hours as needed for nausea or vomiting.    Dispense:  60 tablet    Refill:  1    Follow-up: Return in about 3 weeks (around 05/09/2016) for For follow-up of liver cirrhosis.   [name] MD

## 2016-04-19 ENCOUNTER — Other Ambulatory Visit: Payer: Self-pay

## 2016-04-19 ENCOUNTER — Ambulatory Visit (HOSPITAL_COMMUNITY)
Admission: RE | Admit: 2016-04-19 | Discharge: 2016-04-19 | Disposition: A | Discharge status: Home / Self Care | Payer: Medicaid Other | Source: Ambulatory Visit | Attending: Family Medicine | Admitting: Family Medicine

## 2016-04-19 ENCOUNTER — Telehealth: Payer: Self-pay | Admitting: Family Medicine

## 2016-04-19 DIAGNOSIS — R188 Other ascites: Secondary | ICD-10-CM | POA: Insufficient documentation

## 2016-04-19 DIAGNOSIS — K769 Liver disease, unspecified: Secondary | ICD-10-CM | POA: Diagnosis present

## 2016-04-19 DIAGNOSIS — K7460 Unspecified cirrhosis of liver: Secondary | ICD-10-CM

## 2016-04-19 LAB — COMPREHENSIVE METABOLIC PANEL
ALK PHOS: 109 U/L (ref 40–115)
ALT: 10 U/L (ref 9–46)
AST: 23 U/L (ref 10–35)
Albumin: 2.5 g/dL — ABNORMAL LOW (ref 3.6–5.1)
BUN: 7 mg/dL (ref 7–25)
CHLORIDE: 101 mmol/L (ref 98–110)
CO2: 26 mmol/L (ref 20–31)
CREATININE: 0.5 mg/dL — AB (ref 0.70–1.33)
Calcium: 8 mg/dL — ABNORMAL LOW (ref 8.6–10.3)
Glucose, Bld: 121 mg/dL — ABNORMAL HIGH (ref 65–99)
Potassium: 3.9 mmol/L (ref 3.5–5.3)
SODIUM: 133 mmol/L — AB (ref 135–146)
TOTAL PROTEIN: 5.9 g/dL — AB (ref 6.1–8.1)
Total Bilirubin: 1.5 mg/dL — ABNORMAL HIGH (ref 0.2–1.2)

## 2016-04-19 MED ORDER — ALBUMIN HUMAN 25 % IV SOLN
25.0000 g | Freq: Once | INTRAVENOUS | Status: AC
  Administered 2016-04-19: 25 g via INTRAVENOUS
  Filled 2016-04-19: qty 100

## 2016-04-19 MED ORDER — LIDOCAINE HCL 1 % IJ SOLN
INTRAMUSCULAR | Status: AC
  Filled 2016-04-19: qty 20

## 2016-04-19 NOTE — Telephone Encounter (Signed)
[facility] calling to report pt is experiencing burning during urinating and general UT symptoms, pt might need urianalysis during next visit.  RN requesting orders be extended for 2 visits per week for two more weeks.    Also Pt states that he received call regarding Cipro prescription but not sure which pharmacy called or which Dr prescribed this.  Pt is unable to pick prescription.

## 2016-04-19 NOTE — Procedures (Signed)
Successful US guided paracentesis from RLQ.  Yielded 1.9L of serosanguinous fluid.  No immediate complications.  Pt tolerated well.   Specimen was not sent for labs.  Brayton ElBRUNING, Angus Amini PA-C 04/19/2016 2:57 PM

## 2016-04-24 ENCOUNTER — Telehealth: Payer: Self-pay

## 2016-04-24 NOTE — Telephone Encounter (Signed)
[facility] is calling to follow up on request of orders to continue visiting patient.  Please call nurse back

## 2016-04-24 NOTE — Telephone Encounter (Signed)
Nurse asked if patient had been given advised as to UTI, I informed nurse of [facility] in which [facility] was advised to call for appt.  [facility] nurse is concerned that the ball has been dropped in the medical care of this patient and would like to speak to nurse or PCP concerning patient

## 2016-04-24 NOTE — Telephone Encounter (Signed)
Writer routed [employer] today after speaking to patient.  Waiting for answer.

## 2016-04-24 NOTE — Telephone Encounter (Signed)
Writer spoke with patient regarding his lab results.  Patient states understanding.  Patient states he is having UTI symptoms with frequency, burning and cloudy urine.  Patient states that "he saw a doctor awhile ago that placed him on cipro but does not know what pharmacy it was sent to".  Patient to call to set up an appt with Dr. Venetia NightAmao.

## 2016-04-24 NOTE — Telephone Encounter (Signed)
I signed [facility] orders for this patient and if he has urinary symptoms, he needs a visit.

## 2016-04-24 NOTE — Telephone Encounter (Signed)
-----   Message from [name], MD sent at 04/19/2016  2:51 PM EST ----- Renal function is stable; he is mildly anemic.

## 2016-04-25 ENCOUNTER — Telehealth: Payer: Self-pay

## 2016-04-25 NOTE — Telephone Encounter (Signed)
[facility] called [facility] back and they stated that there contract with patient needs to be renewed.  Writer asked them to fax a new one to extend care.  RN will see patient tomorrow to assess patient for a UTI.

## 2016-04-26 ENCOUNTER — Other Ambulatory Visit: Payer: Self-pay | Admitting: Family Medicine

## 2016-04-26 ENCOUNTER — Ambulatory Visit (HOSPITAL_COMMUNITY)
Admission: RE | Admit: 2016-04-26 | Discharge: 2016-04-26 | Disposition: A | Discharge status: Home / Self Care | Payer: Medicaid Other | Source: Ambulatory Visit | Attending: Family Medicine | Admitting: Family Medicine

## 2016-04-26 DIAGNOSIS — R188 Other ascites: Secondary | ICD-10-CM | POA: Insufficient documentation

## 2016-04-26 DIAGNOSIS — K769 Liver disease, unspecified: Principal | ICD-10-CM

## 2016-04-26 DIAGNOSIS — K7460 Unspecified cirrhosis of liver: Secondary | ICD-10-CM

## 2016-04-26 MED ORDER — LIDOCAINE HCL 1 % IJ SOLN
INTRAMUSCULAR | Status: AC
  Filled 2016-04-26: qty 20

## 2016-05-03 ENCOUNTER — Other Ambulatory Visit: Payer: Self-pay

## 2016-05-04 ENCOUNTER — Ambulatory Visit (HOSPITAL_COMMUNITY)
Admission: RE | Admit: 2016-05-04 | Discharge: 2016-05-04 | Disposition: A | Discharge status: Home / Self Care | Payer: Medicaid Other | Source: Ambulatory Visit | Attending: Family Medicine | Admitting: Family Medicine

## 2016-05-04 ENCOUNTER — Telehealth: Payer: Self-pay | Admitting: Family Medicine

## 2016-05-04 DIAGNOSIS — R339 Retention of urine, unspecified: Secondary | ICD-10-CM

## 2016-05-04 DIAGNOSIS — K7460 Unspecified cirrhosis of liver: Secondary | ICD-10-CM | POA: Diagnosis not present

## 2016-05-04 DIAGNOSIS — K7031 Alcoholic cirrhosis of liver with ascites: Secondary | ICD-10-CM | POA: Diagnosis not present

## 2016-05-04 DIAGNOSIS — K769 Liver disease, unspecified: Secondary | ICD-10-CM | POA: Diagnosis present

## 2016-05-04 MED ORDER — LIDOCAINE HCL (PF) 1 % IJ SOLN
INTRAMUSCULAR | Status: AC
  Filled 2016-05-04: qty 10

## 2016-05-04 MED ORDER — ALBUMIN HUMAN 25 % IV SOLN
25.0000 g | Freq: Once | INTRAVENOUS | Status: AC
Start: 2016-05-04 — End: 2016-05-04
  Administered 2016-05-04: 25 g via INTRAVENOUS
  Filled 2016-05-04 (×2): qty 100

## 2016-05-04 NOTE — Telephone Encounter (Signed)
Referred to Urology; he needs an office visit with me if he is having urinary retention

## 2016-05-04 NOTE — Procedures (Signed)
Ultrasound-guided therapeutic paracentesis performed yielding 2.1 liters of serosanguineous colored fluid. No immediate complications. Patient will receive 25g of albumin as well.  I also called and left a message for his PCP in regards to his massively distended bladder.  Annastyn Silvey E 11:06 AM 05/04/2016

## 2016-05-04 NOTE — Telephone Encounter (Signed)
Tresa EndoKelly from [facility] is currently performing a paracentesis on pt and wanted to inform PCP that pts bladder is extremely large. States bladder is holding approximately 1-2 liters of fluid. States this could be the source of pts pain versus his ascites  States pt is having a home health nurse going to his home this afternoon and didn't know if PCP would want to place an order for an ino

## 2016-05-05 ENCOUNTER — Telehealth: Payer: Self-pay | Admitting: Family Medicine

## 2016-05-05 ENCOUNTER — Telehealth: Payer: Self-pay | Admitting: Licensed Clinical Social Worker

## 2016-05-05 NOTE — Telephone Encounter (Signed)
Writer called [facility] back [facility] who stated that she was with patient today and he is refusing to go to the ED.  She did speak to him about what could happen if he did not have the urine removed from infection to sepsis. Judeth CornfieldStephanie also stated that patient had several large bottles of alcohol in his home in the open where it could be seen and that he is drinking again. She did admit to dropping off the urine to the lab because she had collected it and it would be paid for to have it tested.

## 2016-05-05 NOTE — Telephone Encounter (Signed)
LCSWA spoke with Judeth CornfieldStephanie at [facility] [phone]. Judeth CornfieldStephanie reported that pt has been provided a SCAT application; however, is having difficulty comprehending and their Child psychotherapistocial Worker is unavailable to assist pt.   LCSWA agreed to provide assistance to pt at his next scheduled appointment with PCP on 05/09/16. Caller was appreciative of assistance in patient care.

## 2016-05-05 NOTE — Telephone Encounter (Signed)
[facility] to inform [facility]CP that she went to pts house and collected a urine specimen Pt informed that PCP instructed pt to go to the ED to have a catheter put in  States pt is refusing to go to ED Judeth CornfieldStephanie requesting to know if PCP still wants her to send of pts urine specimen that was collected

## 2016-05-05 NOTE — Telephone Encounter (Signed)
He does not need a urine specimen sent off from home; what he needs is relief of acute urinary retention at the ED.

## 2016-05-09 ENCOUNTER — Telehealth: Payer: Self-pay

## 2016-05-09 ENCOUNTER — Encounter: Payer: Self-pay | Admitting: Licensed Clinical Social Worker

## 2016-05-09 ENCOUNTER — Encounter: Payer: Self-pay | Admitting: Family Medicine

## 2016-05-09 ENCOUNTER — Ambulatory Visit: Payer: Medicaid Other | Attending: Family Medicine | Admitting: Family Medicine

## 2016-05-09 VITALS — BP 123/82 | HR 87 | Temp 97.8°F | Ht 73.0 in | Wt 162.0 lb

## 2016-05-09 DIAGNOSIS — I10 Essential (primary) hypertension: Secondary | ICD-10-CM | POA: Insufficient documentation

## 2016-05-09 DIAGNOSIS — R339 Retention of urine, unspecified: Secondary | ICD-10-CM | POA: Diagnosis not present

## 2016-05-09 DIAGNOSIS — J449 Chronic obstructive pulmonary disease, unspecified: Secondary | ICD-10-CM | POA: Diagnosis not present

## 2016-05-09 DIAGNOSIS — K219 Gastro-esophageal reflux disease without esophagitis: Secondary | ICD-10-CM | POA: Insufficient documentation

## 2016-05-09 DIAGNOSIS — F339 Major depressive disorder, recurrent, unspecified: Secondary | ICD-10-CM | POA: Diagnosis not present

## 2016-05-09 DIAGNOSIS — K7031 Alcoholic cirrhosis of liver with ascites: Secondary | ICD-10-CM | POA: Insufficient documentation

## 2016-05-09 DIAGNOSIS — E876 Hypokalemia: Secondary | ICD-10-CM | POA: Diagnosis not present

## 2016-05-09 MED ORDER — TAMSULOSIN HCL 0.4 MG PO CAPS: 0.4000 mg | ORAL_CAPSULE | Freq: Every day | ORAL | 3 refills | Status: DC

## 2016-05-09 MED ORDER — FUROSEMIDE 20 MG PO TABS: 20.0000 mg | ORAL_TABLET | Freq: Every day | ORAL | 3 refills | Status: DC

## 2016-05-09 MED ORDER — RANITIDINE HCL 150 MG PO TABS: 150.0000 mg | ORAL_TABLET | Freq: Two times a day (BID) | ORAL | 3 refills | Status: DC

## 2016-05-09 MED ORDER — CITALOPRAM HYDROBROMIDE 20 MG PO TABS: 20.0000 mg | ORAL_TABLET | Freq: Every day | ORAL | 3 refills | Status: DC

## 2016-05-09 MED ORDER — SPIRONOLACTONE 50 MG PO TABS
50.0000 mg | ORAL_TABLET | Freq: Every day | ORAL | 3 refills | Status: DC
Start: 2016-05-09 — End: 2016-07-12

## 2016-05-09 MED ORDER — POTASSIUM CHLORIDE CRYS ER 20 MEQ PO TBCR: 20.0000 meq | EXTENDED_RELEASE_TABLET | Freq: Every day | ORAL | 2 refills | Status: DC

## 2016-05-09 NOTE — Progress Notes (Signed)
Subjective:  Patient ID: [name], male    DOB: [birth year]  Age: 55 y.o. 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  Last paracentesis was on 05/04/16 at which time ultrasound revealed massively distended bladder and I had called the patient and advised him to present for relief of urinary retention which the patient failed to do; had also referred him to the last urology and he is awaiting an appointment. Today he informs me that he does not want a Foley cath placed because he is scared.  He denies abdominal pain but endorses straining to urinate and some dysuria but no flank pain ;he is unable to provide a urine specimen for urinalysis. He has some nausea but no constipation or diarrhea or fever  Past Medical History:  Diagnosis Date  . Cirrhosis of liver (HCC)   . Constipation   . COPD (chronic obstructive pulmonary disease) (HCC)   . Depression   . Dyspnea   . ETOH abuse   . GERD (gastroesophageal reflux disease)   . Hypertension     Past Surgical History:  Procedure Laterality Date  . IR GENERIC HISTORICAL  03/24/2016   IR TIPS 03/24/2016 [name], MD [facility]INTERV RAD  . IR GENERIC HISTORICAL  03/15/2016   IR RADIOLOGIST EVAL & MGMT 03/15/2016 [name], MD [facility] INTERV RAD  . MANDIBLE FRACTURE SURGERY  30 yrs. ago   pt. states he has a steele plate  . NO PAST SURGERIES     jaw surgery 29yrs ago  . RADIOLOGY WITH ANESTHESIA N/A 03/24/2016   Procedure: TIPS;  Surgeon: [name], MD;  Location: [facility] OR;  Service: Radiology;  Laterality: N/A;  . UMBILICAL HERNIA REPAIR N/A 03/25/2016   Procedure: HERNIA REPAIR UMBILICAL INCARCERATED;  Surgeon: [name], MD;  Location: [facility] OR;  Service:  General;  Laterality: N/A;    No Known Allergies   Outpatient Medications Prior to Visit  Medication Sig Dispense Refill  . albuterol (PROVENTIL HFA;VENTOLIN HFA) 108 (90 Base) MCG/ACT inhaler Inhale 2 puffs into the lungs every 6 (six) hours as needed for wheezing or shortness of breath. 54 g 0  . diclofenac sodium (VOLTAREN) 1 % GEL Apply 4 g topically 4 (four) times daily as needed (for pain). 100 g 0  . lactulose (CHRONULAC) 10 GM/15ML solution Take 30 mLs (20 g total) by mouth 3 (three) times daily. 946 mL 0  . ondansetron (ZOFRAN) 4 MG tablet Take 1 tablet (4 mg total) by mouth every 8 (eight) hours as needed for nausea or vomiting. 60 tablet 1  . VOLTAREN 1 % GEL APPLY 4 GRAMS TOPICALLY 4 TIMES DAILY. 100 g 0  . citalopram (CELEXA) 20 MG tablet Take 1 tablet (20 mg total) by mouth daily. 30 tablet 0  . furosemide (LASIX) 20 MG tablet Take 1 tablet (20 mg total) by mouth daily. 30 tablet 0  . potassium chloride SA (K-DUR,KLOR-CON) 20 MEQ tablet Take 1 tablet (20 mEq total) by mouth daily. 30 tablet 0  . ranitidine (ZANTAC) 150 MG tablet Take 1 tablet (150 mg total) by mouth 2 (two) times daily. 60 tablet 0  . feeding supplement, ENSURE ENLIVE, (ENSURE ENLIVE) LIQD Take 237 mLs by mouth 3 (three) times daily between meals. (Patient not taking: Reported on  05/09/2016) 60 Bottle 0  . spironolactone (ALDACTONE) 50 MG tablet Take 1 tablet (50 mg total) by mouth daily. (Patient not taking: Reported on 05/09/2016) 30 tablet 0   No facility-administered medications prior to visit.     ROS Review of Systems  Constitutional: Negative for activity change and appetite change.  HENT: Negative for sinus pressure and sore throat.   Eyes: Negative for visual disturbance.  Respiratory: Negative for cough, chest tightness and shortness of breath.   Cardiovascular: Negative for chest pain and leg swelling.  Gastrointestinal: Positive for nausea. Negative for abdominal distention, abdominal pain,  constipation and diarrhea.  Endocrine: Negative.   Genitourinary:       See history of present illness  Musculoskeletal: Negative for joint swelling and myalgias.  Skin: Negative for rash.  Allergic/Immunologic: Negative.   Neurological: Negative for weakness, light-headedness and numbness.  Psychiatric/Behavioral: Negative for dysphoric mood and suicidal ideas.    Objective:  BP 123/82 (BP Location: Right Arm, Patient Position: Sitting, Cuff Size: Large)   Pulse 87   Temp 97.8 F (36.6 C) (Oral)   Ht 6\' 1"  (1.854 m)   Wt 162 lb (73.5 kg)   SpO2 100%   BMI 21.37 kg/m   BP/Weight 05/09/2016 05/04/2016 04/19/2016  Systolic BP 123 115 115  Diastolic BP 82 73 75  Wt. (Lbs) 162 161 165  BMI 21.37 21.24 21.77  Some encounter information is confidential and restricted. Go to Review Flowsheets activity to see all data.      Physical Exam  Constitutional: He is oriented to person, place, and time.  Chronically ill and thin looking  Cardiovascular: Normal rate, normal heart sounds and intact distal pulses.   No murmur heard. Pulmonary/Chest: Effort normal and breath sounds normal. He has no wheezes. He has no rales. He exhibits no tenderness.  Abdominal: Soft. Bowel sounds are normal. He exhibits distension (slight abdominal distention). He exhibits no mass. There is no tenderness.  Musculoskeletal: Normal range of motion. He exhibits edema.  Neurological: He is alert and oriented to person, place, and time.  Skin: Rash (lower extremity skin changes in keeping with resolving edema) noted.     CMP Latest Ref Rng & Units 04/18/2016 04/11/2016 03/30/2016  Glucose 65 - 99 mg/dL 409(W121(H) 91 89  BUN 7 - 25 mg/dL 7 7 8   Creatinine 0.70 - 1.33 mg/dL 1.19(J0.50(L) 4.78(G0.43(L) 9.56(O0.60(L)  Sodium 135 - 146 mmol/L 133(L) 136 135  Potassium 3.5 - 5.3 mmol/L 3.9 4.1 3.4(L)  Chloride 98 - 110 mmol/L 101 104 103  CO2 20 - 31 mmol/L 26 27 27   Calcium 8.6 - 10.3 mg/dL 8.0(L) 7.8(L) 7.7(L)  Total Protein 6.1  - 8.1 g/dL 5.9(L) 5.6(L) 4.4(L)  Total Bilirubin 0.2 - 1.2 mg/dL 1.3(Y1.5(H) 1.3(H) 2.3(H)  Alkaline Phos 40 - 115 U/L 109 104 86  AST 10 - 35 U/L 23 30 44(H)  ALT 9 - 46 U/L 10 12 29      Assessment & Plan:   1. Urinary retention Have stressed the need for the patient to have a Foley for relief of retention however the patient is insistent on not having it ? Underlying BPH [facility] Urology referral pending; I have provided him the #12 ounce urology - tamsulosin (FLOMAX) 0.4 MG CAPS capsule; Take 1 capsule (0.4 mg total) by mouth daily.  Dispense: 30 capsule; Refill: 3 - Basic Metabolic Panel - PSA, total and free - Urinalysis Dipstick  2. Recurrent major depressive disorder, remission status unspecified (HCC) Stable -  citalopram (CELEXA) 20 MG tablet; Take 1 tablet (20 mg total) by mouth daily.  Dispense: 30 tablet; Refill: 3  3. Alcoholic cirrhosis of liver with ascites (HCC) Status post status post TIPS No indication for thoracentesis at this time will reassess at next visit - spironolactone (ALDACTONE) 50 MG tablet; Take 1 tablet (50 mg total) by mouth daily.  Dispense: 30 tablet; Refill: 3 - furosemide (LASIX) 20 MG tablet; Take 1 tablet (20 mg total) by mouth daily.  Dispense: 30 tablet; Refill: 3  4. Gastroesophageal reflux disease without esophagitis Nausea could be due to the fact that he has run out of his Zantac which I have refilled. - ranitidine (ZANTAC) 150 MG tablet; Take 1 tablet (150 mg total) by mouth 2 (two) times daily.  Dispense: 60 tablet; Refill: 3  5. Hypokalemia - potassium chloride SA (K-DUR,KLOR-CON) 20 MEQ tablet; Take 1 tablet (20 mEq total) by mouth daily.  Dispense: 30 tablet; Refill: 2   Meds ordered this encounter  Medications  . citalopram (CELEXA) 20 MG tablet    Sig: Take 1 tablet (20 mg total) by mouth daily.    Dispense:  30 tablet    Refill:  3  . spironolactone (ALDACTONE) 50 MG tablet    Sig: Take 1 tablet (50 mg total) by mouth  daily.    Dispense:  30 tablet    Refill:  3  . ranitidine (ZANTAC) 150 MG tablet    Sig: Take 1 tablet (150 mg total) by mouth 2 (two) times daily.    Dispense:  60 tablet    Refill:  3  . furosemide (LASIX) 20 MG tablet    Sig: Take 1 tablet (20 mg total) by mouth daily.    Dispense:  30 tablet    Refill:  3  . tamsulosin (FLOMAX) 0.4 MG CAPS capsule    Sig: Take 1 capsule (0.4 mg total) by mouth daily.    Dispense:  30 capsule    Refill:  3  . potassium chloride SA (K-DUR,KLOR-CON) 20 MEQ tablet    Sig: Take 1 tablet (20 mEq total) by mouth daily.    Dispense:  30 tablet    Refill:  2    Follow-up: Return in about 1 month (around 06/09/2016) for Follow-up of alcoholic cirrhosis.   [name] MD

## 2016-05-09 NOTE — Progress Notes (Signed)
"  Took a couple swigs of beer the other day"

## 2016-05-09 NOTE — Progress Notes (Signed)
LCSWA and RN Case Manager, Eden Lathe, met with patient to assist in completion of SCAT application. The application will be faxed to [place] upon completion.

## 2016-05-09 NOTE — Telephone Encounter (Signed)
Met with the patient when he was in the clinic to assist with completion of the SCAT application.

## 2016-05-10 ENCOUNTER — Telehealth: Payer: Self-pay

## 2016-05-10 LAB — BASIC METABOLIC PANEL
BUN: 7 mg/dL (ref 7–25)
CALCIUM: 8.3 mg/dL — AB (ref 8.6–10.3)
CO2: 25 mmol/L (ref 20–31)
Chloride: 102 mmol/L (ref 98–110)
Creat: 0.52 mg/dL — ABNORMAL LOW (ref 0.70–1.33)
GLUCOSE: 95 mg/dL (ref 65–99)
POTASSIUM: 3.6 mmol/L (ref 3.5–5.3)
SODIUM: 136 mmol/L (ref 135–146)

## 2016-05-10 LAB — PSA, TOTAL AND FREE
PSA, % FREE: 14 % — AB (ref >25)
PSA, Free: 0.1 ng/mL
PSA, TOTAL: 0.7 ng/mL (ref <=4.0)

## 2016-05-10 NOTE — Telephone Encounter (Signed)
Completed SCAT application faxed to # [phone] 

## 2016-05-11 ENCOUNTER — Telehealth: Payer: Self-pay

## 2016-05-11 NOTE — Telephone Encounter (Signed)
-----   Message from [name], MD sent at 05/11/2016  8:30 AM EST ----- Labs are stable

## 2016-05-11 NOTE — Telephone Encounter (Signed)
Writer called patient today per Dr. Venetia NightAmao and discussed his lab results.  Patient stated understanding.

## 2016-06-02 ENCOUNTER — Telehealth: Payer: Self-pay

## 2016-06-02 NOTE — Telephone Encounter (Signed)
Writer spoke with patient about urology appt scheduled for 06/26/16 at 3pm.  At patients request writer sent a written reminder to his home.

## 2016-06-06 ENCOUNTER — Other Ambulatory Visit (HOSPITAL_COMMUNITY): Payer: Self-pay | Admitting: Interventional Radiology

## 2016-06-26 ENCOUNTER — Encounter: Payer: Self-pay | Admitting: Family Medicine

## 2016-06-27 ENCOUNTER — Encounter: Payer: Self-pay | Admitting: Radiology

## 2016-06-27 ENCOUNTER — Ambulatory Visit
Admission: RE | Admit: 2016-06-27 | Discharge: 2016-06-27 | Disposition: A | Discharge status: Home / Self Care | Payer: Medicaid Other | Source: Ambulatory Visit | Attending: Interventional Radiology | Admitting: Interventional Radiology

## 2016-06-27 ENCOUNTER — Ambulatory Visit
Admission: RE | Admit: 2016-06-27 | Discharge: 2016-06-27 | Disposition: A | Discharge status: Home / Self Care | Payer: Medicaid Other | Source: Ambulatory Visit | Attending: Radiology | Admitting: Radiology

## 2016-06-27 DIAGNOSIS — R188 Other ascites: Secondary | ICD-10-CM

## 2016-06-27 DIAGNOSIS — K7460 Unspecified cirrhosis of liver: Secondary | ICD-10-CM

## 2016-06-27 DIAGNOSIS — K7031 Alcoholic cirrhosis of liver with ascites: Secondary | ICD-10-CM

## 2016-06-27 DIAGNOSIS — K769 Liver disease, unspecified: Secondary | ICD-10-CM

## 2016-06-27 HISTORY — PX: IR GENERIC HISTORICAL: IMG1180011

## 2016-06-27 NOTE — Progress Notes (Signed)
Patient ID: [name], male   DOB: [birth year], 56 y.o.   MRN: [id number]   Referring Physician(s): Dr. [name]  Chief Complaint: The patient is seen in follow up today s/p TIPS on 03/24/16  History of present illness:  [name] is a 56yo male who is well known to the IR service for weekly paracenteses secondary to alcoholic cirrhosis.  He underwent a TIPS procedure on 03-24-16.  This was complicated by an incarcerated umbilical hernia as well.  He underwent repair of this by general surgery after his TIPS was completed.  The patient has tolerated the procedure well.  He has had to have 2 paracenteses since his procedure, but these have been small amounts.  He is currently on spironolactone, lasix, and lactulose.  He has no issues with encephalopathy.  He denies any abdominal pain.  He is still having issues with urinary retention and just had an indwelling foley catheter placed yesterday.  He otherwise has no other complaints.    Past Medical History:  Diagnosis Date  . Cirrhosis of liver (HCC)   . Constipation   . COPD (chronic obstructive pulmonary disease) (HCC)   . Depression   . Dyspnea   . ETOH abuse   . GERD (gastroesophageal reflux disease)   . Hypertension     Past Surgical History:  Procedure Laterality Date  . IR GENERIC HISTORICAL  03/24/2016   IR TIPS 03/24/2016 [name], MD [facility]INTER[facility]  . IR GENERIC HISTORICAL  03/15/2016   IR RADIOLOGIST EVAL & MGMT 03/15/2016 [name], MD [facility] INTERV RAD  . IR GENERIC HISTORICAL  11/03/2015   IR TIPS 11/03/2015 [facility]-RAD OUT REF  . IR GENERIC HISTORICAL  06/27/2016   IR RADIOLOGIST EVAL & MGMT 06/27/2016 [name], MD [facility] INTERV RAD  . MANDIBLE FRACTURE SURGERY  30 yrs. ago   pt. states he has a steele plate  . NO PAST SURGERIES     jaw surgery 33yrs ago  . RADIOLOGY WITH ANESTHESIA N/A 03/24/2016   Procedure: TIPS;  Surgeon: [name], MD;  Location[facility] OR;  Service: Radiology;  Laterality: N/A;  .  UMBILICAL HERNIA REPAIR N/A 03/25/2016   Procedure: HERNIA REPAIR UMBILICAL INCARCERATED;  Surgeon: [name], MD;  Location: [facility] OR;  Service: General;  Laterality: N/A;    Allergies: Patient has no known allergies.  Medications: Prior to Admission medications   Medication Sig Start Date End Date Taking? Authorizing Provider  albuterol (PROVENTIL HFA;VENTOLIN HFA) 108 (90 Base) MCG/ACT inhaler Inhale 2 puffs into the lungs every 6 (six) hours as needed for wheezing or shortness of breath. 04/11/16  Yes [name], PA-C  citalopram (CELEXA) 20 MG tablet Take 1 tablet (20 mg total) by mouth daily. 05/09/16  Yes [name], MD  diclofenac sodium (VOLTAREN) 1 % GEL Apply 4 g topically 4 (four) times daily as needed (for pain). 04/01/16  Yes [name], MD  furosemide (LASIX) 20 MG tablet Take 1 tablet (20 mg total) by mouth daily. 05/09/16  Yes [name], MD  lactulose (CHRONULAC) 10 GM/15ML solution Take 30 mLs (20 g total) by mouth 3 (three) times daily. 04/11/16  Yes [name], PA-C  ondansetron (ZOFRAN) 4 MG tablet Take 1 tablet (4 mg total) by mouth every 8 (eight) hours as needed for nausea or vomiting. 04/18/16  Yes [name], MD  potassium chloride SA (K-DUR,KLOR-CON) 20 MEQ tablet Take 1 tablet (20 mEq total) by mouth daily. 05/09/16  Yes [name], MD  ranitidine (ZANTAC) 150 MG tablet Take 1 tablet (150 mg total) by mouth 2 (two) times daily. 05/09/16  Yes [name], MD  spironolactone (ALDACTONE) 50 MG tablet Take 1 tablet (50 mg total) by mouth daily. 05/09/16  Yes [name], MD  tamsulosin (FLOMAX) 0.4 MG CAPS capsule Take 1 capsule (0.4 mg total) by mouth daily. 05/09/16  Yes [name] [name], MD  VOLTAREN 1 % GEL APPLY 4 GRAMS TOPICALLY 4 TIMES DAILY. 04/05/16  Yes [name], MD  feeding supplement, ENSURE ENLIVE, (ENSURE ENLIVE) LIQD Take 237 mLs by mouth 3 (three) times daily between meals. Patient not taking: Reported on 05/09/2016 04/01/16    [name], MD     Family History  Problem Relation Age of Onset  . Diabetes Mother     Social History   Social History  . Marital status: Single    Spouse name: N/A  . Number of children: N/A  . Years of education: N/A   Social History Main Topics  . Smoking status: Current Every Day Smoker    Packs/day: 0.50    Years: 0.50    Types: Cigarettes  . Smokeless tobacco: Never Used     Comment: 6-8 cigs daily  . Alcohol use No     Comment: " a couple swigs of beer the other day"  . Drug use: No  . Sexual activity: No   Other Topics Concern  . Not on file   Social History Narrative  . No narrative on file     Vital Signs: BP 134/79 (BP Location: Left Arm, Patient Position: Sitting, Cuff Size: Normal)   Pulse 64   Temp 98 F (36.7 C) (Oral)   Resp 16   Ht 6\' 1"  (1.854 m)   Wt 161 lb (73 kg)   SpO2 100%   BMI 21.24 kg/m   Physical Exam  GEN: NAD Heart: regular, rate, and rhythm  Lungs: CTAB Abd: soft, NT, ND, +BS, no evidence of overt ascites  Imaging: Ir Radiologist Eval & Mgmt  Result Date: 06/27/2016 Please refer to "Notes" to see consult details.   Labs:  CBC:  Recent Labs  03/28/16 0207 03/30/16 0725 04/11/16 0925 04/18/16 1013  WBC 15.5* 9.5 7.7 7.0  HGB 13.3 13.1 12.8* 11.9*  HCT 40.2 39.0 37.5* 35.4*  PLT 72* 69* 153 205    COAGS:  Recent Labs  09/16/15 1719  02/05/16 1010 03/24/16 0816 03/25/16 0440 03/26/16 0952 03/27/16 0345 03/28/16 0207  INR 1.36  < > 1.19 1.47 1.88 2.09 1.74 1.76  APTT 37  --  35 38* 49*  --   --   --   < > = values in this interval not displayed.  BMP:  Recent Labs  03/26/16 0400 03/27/16 0345 03/28/16 0207 03/30/16 0725 04/11/16 0925 04/18/16 1013 05/09/16 1534  NA 131* 132* 134* 135 136 133* 136  K 3.6 4.2 3.8 3.4* 4.1 3.9 3.6  CL 103 101 102 103 104 101 102  CO2 23 28 28 27 27 26 25   GLUCOSE 158* 137* 93 89 91 121* 95  BUN <5* <5* 10 8 7 7 7   CALCIUM 6.8* 7.4* 7.7* 7.7* 7.8*  8.0* 8.3*  CREATININE 0.46* 0.61 0.65 0.60* 0.43* 0.50* 0.52*  GFRNONAA >60 >60 >60 >60  --   --   --   GFRAA >60 >60 >60 >60  --   --   --     LIVER FUNCTION TESTS:  Recent Labs  03/27/16 0345 03/28/16  0207 03/30/16 0725 04/11/16 0925 04/18/16 1013  BILITOT 1.9*  --  2.3* 1.3* 1.5*  AST 106*  --  44* 30 23  ALT 48  --  29 12 10   ALKPHOS 109  --  86 104 109  PROT 4.9*  --  4.4* 5.6* 5.9*  ALBUMIN 2.2* 2.0* 1.9* 2.4* 2.5*    Assessment:  1. Alcoholic cirrhosis, s/p TIPS on 03-24-16  The patient is doing well after his TIPS.  His ascites has significantly improved. He does state he is still drinking though.  His flow measurements on his [place] today look good.  He should continue to follow up with his PCP for further management.  He will return to see [place] with another [place] in 6 months from today as well as a clinic follow up.  Signed: Letha Cape 06/27/2016, 11:25 AM   Please refer to Dr. Dereck Ligas attestation of this note for management and plan.

## 2016-07-04 ENCOUNTER — Telehealth: Payer: Self-pay | Admitting: Family Medicine

## 2016-07-04 NOTE — Telephone Encounter (Signed)
Pt. Called stating that he has a catheter and is in pain. Pt. States that he does not know what to do. Please f/u with pt.

## 2016-07-04 NOTE — Telephone Encounter (Signed)
He needs to inform urology of his symptoms

## 2016-07-04 NOTE — Telephone Encounter (Signed)
Pt describes pain in the tip of prepuce. He states he got catheter place by [facility]. He states the bag is filling appropriately and denies blood or discomfort or swelling in abdomen. Encouraged to put [facility] LPKY lubricant, ointment such as neosporin or Triple Antibiotic to the tip of prepuce and call urology office to inform of discomfort. Stated he needed to make apt with Dr. Venetia NightAmao. Apt scheduled for 07/12/16

## 2016-07-04 NOTE — Telephone Encounter (Signed)
Writer called patient and informed him of Dr. Jen MowAmao's recommendations to call urology and let them know of his present symptoms. Writer encouraged to call back with questions.

## 2016-07-12 ENCOUNTER — Encounter: Payer: Self-pay | Admitting: Family Medicine

## 2016-07-12 ENCOUNTER — Ambulatory Visit: Payer: Medicaid Other | Attending: Family Medicine | Admitting: Family Medicine

## 2016-07-12 ENCOUNTER — Other Ambulatory Visit: Payer: Self-pay | Admitting: Family Medicine

## 2016-07-12 VITALS — BP 158/91 | HR 86 | Temp 98.3°F | Wt 181.0 lb

## 2016-07-12 DIAGNOSIS — R338 Other retention of urine: Secondary | ICD-10-CM | POA: Diagnosis not present

## 2016-07-12 DIAGNOSIS — K7031 Alcoholic cirrhosis of liver with ascites: Secondary | ICD-10-CM

## 2016-07-12 DIAGNOSIS — N401 Enlarged prostate with lower urinary tract symptoms: Secondary | ICD-10-CM | POA: Diagnosis not present

## 2016-07-12 DIAGNOSIS — K219 Gastro-esophageal reflux disease without esophagitis: Secondary | ICD-10-CM

## 2016-07-12 DIAGNOSIS — J9611 Chronic respiratory failure with hypoxia: Secondary | ICD-10-CM

## 2016-07-12 DIAGNOSIS — E876 Hypokalemia: Secondary | ICD-10-CM

## 2016-07-12 DIAGNOSIS — I10 Essential (primary) hypertension: Secondary | ICD-10-CM | POA: Diagnosis not present

## 2016-07-12 DIAGNOSIS — J449 Chronic obstructive pulmonary disease, unspecified: Secondary | ICD-10-CM | POA: Insufficient documentation

## 2016-07-12 DIAGNOSIS — F17200 Nicotine dependence, unspecified, uncomplicated: Secondary | ICD-10-CM | POA: Diagnosis not present

## 2016-07-12 DIAGNOSIS — R339 Retention of urine, unspecified: Secondary | ICD-10-CM

## 2016-07-12 DIAGNOSIS — Z720 Tobacco use: Secondary | ICD-10-CM

## 2016-07-12 DIAGNOSIS — F339 Major depressive disorder, recurrent, unspecified: Secondary | ICD-10-CM | POA: Diagnosis not present

## 2016-07-12 LAB — CBC WITH DIFFERENTIAL/PLATELET
BASOS ABS: 49 {cells}/uL (ref 0–200)
BASOS PCT: 1 %
EOS PCT: 1 %
Eosinophils Absolute: 49 cells/uL (ref 15–500)
HCT: 40.1 % (ref 38.5–50.0)
Hemoglobin: 13.9 g/dL (ref 13.2–17.1)
Lymphocytes Relative: 37 %
Lymphs Abs: 1813 cells/uL (ref 850–3900)
MCH: 32.6 pg (ref 27.0–33.0)
MCHC: 34.7 g/dL (ref 32.0–36.0)
MCV: 94.1 fL (ref 80.0–100.0)
MONOS PCT: 11 %
MPV: 9.6 fL (ref 7.5–12.5)
Monocytes Absolute: 539 cells/uL (ref 200–950)
Neutro Abs: 2450 cells/uL (ref 1500–7800)
Neutrophils Relative %: 50 %
PLATELETS: 74 10*3/uL — AB (ref 140–400)
RBC: 4.26 MIL/uL (ref 4.20–5.80)
RDW: 14.8 % (ref 11.0–15.0)
WBC: 4.9 10*3/uL (ref 3.8–10.8)

## 2016-07-12 LAB — COMPLETE METABOLIC PANEL WITH GFR
ALBUMIN: 2.5 g/dL — AB (ref 3.6–5.1)
ALK PHOS: 180 U/L — AB (ref 40–115)
ALT: 15 U/L (ref 9–46)
AST: 57 U/L — ABNORMAL HIGH (ref 10–35)
BILIRUBIN TOTAL: 1.4 mg/dL — AB (ref 0.2–1.2)
BUN: 5 mg/dL — ABNORMAL LOW (ref 7–25)
CO2: 23 mmol/L (ref 20–31)
Calcium: 8 mg/dL — ABNORMAL LOW (ref 8.6–10.3)
Chloride: 106 mmol/L (ref 98–110)
Creat: 0.59 mg/dL — ABNORMAL LOW (ref 0.70–1.33)
Glucose, Bld: 92 mg/dL (ref 65–99)
Potassium: 3.9 mmol/L (ref 3.5–5.3)
Sodium: 139 mmol/L (ref 135–146)
TOTAL PROTEIN: 6.5 g/dL (ref 6.1–8.1)

## 2016-07-12 MED ORDER — ALBUTEROL SULFATE HFA 108 (90 BASE) MCG/ACT IN AERS: 2.0000 | INHALATION_SPRAY | Freq: Four times a day (QID) | RESPIRATORY_TRACT | 0 refills | Status: DC | PRN

## 2016-07-12 MED ORDER — CITALOPRAM HYDROBROMIDE 20 MG PO TABS: 20.0000 mg | ORAL_TABLET | Freq: Every day | ORAL | 3 refills | Status: DC

## 2016-07-12 MED ORDER — ALBUTEROL SULFATE HFA 108 (90 BASE) MCG/ACT IN AERS: 2.0000 | INHALATION_SPRAY | Freq: Four times a day (QID) | RESPIRATORY_TRACT | 0 refills | Status: AC | PRN

## 2016-07-12 MED ORDER — LACTULOSE 10 GM/15ML PO SOLN: 20.0000 g | Freq: Three times a day (TID) | ORAL | 0 refills | Status: DC

## 2016-07-12 MED ORDER — RANITIDINE HCL 150 MG PO TABS: 150.0000 mg | ORAL_TABLET | Freq: Two times a day (BID) | ORAL | 3 refills | Status: DC

## 2016-07-12 MED ORDER — POTASSIUM CHLORIDE CRYS ER 20 MEQ PO TBCR: 20.0000 meq | EXTENDED_RELEASE_TABLET | Freq: Every day | ORAL | 2 refills | Status: DC

## 2016-07-12 MED ORDER — TAMSULOSIN HCL 0.4 MG PO CAPS: 0.4000 mg | ORAL_CAPSULE | Freq: Every day | ORAL | 3 refills | Status: DC

## 2016-07-12 MED ORDER — SPIRONOLACTONE 50 MG PO TABS
50.0000 mg | ORAL_TABLET | Freq: Every day | ORAL | 3 refills | Status: DC
Start: 2016-07-12 — End: 2016-11-15

## 2016-07-12 MED ORDER — FUROSEMIDE 20 MG PO TABS: 20.0000 mg | ORAL_TABLET | Freq: Every day | ORAL | 3 refills | Status: DC

## 2016-07-12 NOTE — Progress Notes (Signed)
Subjective:  Patient ID: [name], male    DOB: [birth year]  Age: 56 y.o. MRN: [id number]  CC: Medication Refill   HPI [name] is a 56 year old male with a history of alcoholic liver cirrhosis (s/p TIPS in 02/2016), protein calorie malnutrition, GERD, depression, BPH and urinary retention (status post placement of Foley by urology) who comes in today for follow-up visit.  He complains of nausea and endorses running out of his medications for the last 1 week; he also has pedal edema but denies abdominal distention or pain. Denies hematemesis, hematochezia.  Since his last visit he has been to see [facility] where he had a Foley catheter placed secondary to urinary retention and complaints of an uncomfortable feeling due to the presence of the catheter.  Overall he states he is doing well ever since he had his TIPS procedure. Continues to smoke and is not ready to quit.  Past Medical History:  Diagnosis Date  . Cirrhosis of liver (HCC)   . Constipation   . COPD (chronic obstructive pulmonary disease) (HCC)   . Depression   . Dyspnea   . ETOH abuse   . GERD (gastroesophageal reflux disease)   . Hypertension     Past Surgical History:  Procedure Laterality Date  . IR GENERIC HISTORICAL  03/24/2016   IR TIPS 03/24/2016 [name], MD [facility]  . IR GENERIC HISTORICAL  03/15/2016   IR RADIOLOGIST EVAL & MGMT 03/15/2016 [name], MD [facility]  . IR GENERIC HISTORICAL  11/03/2015   IR TIPS 11/03/2015 [facility] OUT REF  . IR GENERIC HISTORICAL  06/27/2016   IR RADIOLOGIST EVAL & MGMT 06/27/2016 [name], MD [facility]  . MANDIBLE FRACTURE SURGERY  30 yrs. ago   pt. states he has a steele plate  . NO PAST SURGERIES     jaw surgery 1855yrs ago  . RADIOLOGY WITH ANESTHESIA N/A 03/24/2016   Procedure: TIPS;  Surgeon: [name], MD;  [facility];  Service: Radiology;  Laterality: N/A;  . UMBILICAL HERNIA REPAIR N/A 03/25/2016   Procedure: HERNIA REPAIR UMBILICAL INCARCERATED;  Surgeon: [name], MD;  Location: [facility] OR;  Service: General;  Laterality: N/A;    No Known Allergies   Outpatient Medications Prior to Visit  Medication Sig Dispense Refill  . ondansetron (ZOFRAN) 4 MG tablet Take 1 tablet (4 mg total) by mouth every 8 (eight) hours as needed for nausea or vomiting. 60 tablet 1  . albuterol (PROVENTIL HFA;VENTOLIN HFA) 108 (90 Base) MCG/ACT inhaler Inhale 2 puffs into the lungs every 6 (six) hours as needed for wheezing or shortness of breath. 54 g 0  . citalopram (CELEXA) 20 MG tablet Take 1 tablet (20 mg total) by mouth daily. 30 tablet 3  . furosemide (LASIX) 20 MG tablet Take 1 tablet (20 mg total) by mouth daily. 30 tablet 3  . lactulose (CHRONULAC) 10 GM/15ML solution Take 30 mLs (20 g total) by mouth 3 (three) times daily. 946 mL 0  . potassium chloride SA (K-DUR,KLOR-CON) 20 MEQ tablet Take 1 tablet (20 mEq total) by mouth daily. 30 tablet 2  . ranitidine (ZANTAC) 150 MG tablet Take 1 tablet (150 mg total) by mouth 2 (two) times daily. 60 tablet 3  . spironolactone (ALDACTONE) 50 MG tablet Take 1 tablet (50 mg total) by mouth daily. 30 tablet 3  . diclofenac sodium (VOLTAREN) 1 % GEL Apply 4 g topically 4 (four) times daily as needed (for pain). (Patient not  taking: Reported on 07/12/2016) 100 g 0  . feeding supplement, ENSURE ENLIVE, (ENSURE ENLIVE) LIQD Take 237 mLs by mouth 3 (three) times daily between meals. (Patient not taking: Reported on 05/09/2016) 60 Bottle 0  . VOLTAREN 1 % GEL APPLY 4 GRAMS TOPICALLY 4 TIMES DAILY. (Patient not taking: Reported on 07/12/2016) 100 g 0  . tamsulosin (FLOMAX) 0.4 MG CAPS capsule Take 1 capsule (0.4 mg total) by mouth daily. (Patient not taking: Reported on 07/12/2016) 30 capsule 3   No facility-administered medications prior to visit.     ROS Review of Systems  Constitutional: Negative for activity change and appetite change.  HENT: Negative for sinus  pressure and sore throat.   Eyes: Negative for visual disturbance.  Respiratory: Negative for cough, chest tightness and shortness of breath.   Cardiovascular: Positive for leg swelling. Negative for chest pain.  Gastrointestinal: Positive for nausea. Negative for abdominal distention, abdominal pain, constipation and diarrhea.  Endocrine: Negative.   Genitourinary: Negative for dysuria.  Musculoskeletal: Negative for joint swelling and myalgias.  Skin: Negative for rash.  Allergic/Immunologic: Negative.   Neurological: Negative for weakness, light-headedness and numbness.  Psychiatric/Behavioral: Negative for dysphoric mood and suicidal ideas.    Objective:  BP (!) 158/91   Pulse 86   Temp 98.3 F (36.8 C) (Oral)   Wt 181 lb (82.1 kg)   SpO2 99%   BMI 23.88 kg/m   BP/Weight 07/12/2016 06/27/2016 05/09/2016  Systolic BP 158 134 123  Diastolic BP 91 79 82  Wt. (Lbs) 181 161 162  BMI 23.88 21.24 21.37  Some encounter information is confidential and restricted. Go to Review Flowsheets activity to see all data.      Physical Exam Constitutional: He is oriented to person, place, and time.  Chronically ill looking  Cardiovascular: Normal rate, normal heart sounds and intact distal pulses.   No murmur heard. Pulmonary/Chest: Effort normal and breath sounds normal. He has no wheezes. He has no rales. He exhibits no tenderness.  Abdominal: Soft. Bowel sounds are normal. He exhibits distension (slight abdominal distention). He exhibits no mass. There is no tenderness.  Musculoskeletal: Normal range of motion. He exhibits 2+ bilateral non pitting pedal edema.  Neurological: He is alert and oriented to person, place, and time.  Skin: no rash.    Assessment & Plan:   1. Alcoholic cirrhosis of liver with ascites (HCC) Status post TIPS Doing well- no for paracentesis indicated at this time Currently pedal edema due to the fact that he ran out of medications; could also explain  elevated blood pressure - spironolactone (ALDACTONE) 50 MG tablet; Take 1 tablet (50 mg total) by mouth daily.  Dispense: 30 tablet; Refill: 3 - lactulose (CHRONULAC) 10 GM/15ML solution; Take 30 mLs (20 g total) by mouth 3 (three) times daily.  Dispense: 946 mL; Refill: 0 - furosemide (LASIX) 20 MG tablet; Take 1 tablet (20 mg total) by mouth daily.  Dispense: 30 tablet; Refill: 3 - COMPLETE METABOLIC PANEL WITH GFR - CBC with Differential/Platelet - Protime-INR  2. Gastroesophageal reflux disease without esophagitis Could explain nausea as he has been out of his PPI which I have refilled - ranitidine (ZANTAC) 150 MG tablet; Take 1 tablet (150 mg total) by mouth 2 (two) times daily.  Dispense: 60 tablet; Refill: 3  3. Recurrent major depressive disorder, remission status unspecified (HCC) Stable - citalopram (CELEXA) 20 MG tablet; Take 1 tablet (20 mg total) by mouth daily.  Dispense: 30 tablet; Refill: 3  4. Hypokalemia  Secondary to use of Lasix - potassium chloride SA (K-DUR,KLOR-CON) 20 MEQ tablet; Take 1 tablet (20 mEq total) by mouth daily.  Dispense: 30 tablet; Refill: 2  5. Chronic respiratory failure with hypoxia (HCC) Stable - albuterol (PROVENTIL HFA;VENTOLIN HFA) 108 (90 Base) MCG/ACT inhaler; Inhale 2 puffs into the lungs every 6 (six) hours as needed for wheezing or shortness of breath.  Dispense: 54 g; Refill: 0  6. Urinary retention Possible underlying BPH Foley catheter in place Keep appointment with urology - tamsulosin (FLOMAX) 0.4 MG CAPS capsule; Take 1 capsule (0.4 mg total) by mouth daily.  Dispense: 30 capsule; Refill: 3  7. Tobacco abuse Spent 3 minutes counseling on cessation and he is not ready to quit at this time.  Meds ordered this encounter  Medications  . spironolactone (ALDACTONE) 50 MG tablet    Sig: Take 1 tablet (50 mg total) by mouth daily.    Dispense:  30 tablet    Refill:  3  . lactulose (CHRONULAC) 10 GM/15ML solution    Sig: Take 30  mLs (20 g total) by mouth 3 (three) times daily.    Dispense:  946 mL    Refill:  0  . furosemide (LASIX) 20 MG tablet    Sig: Take 1 tablet (20 mg total) by mouth daily.    Dispense:  30 tablet    Refill:  3  . ranitidine (ZANTAC) 150 MG tablet    Sig: Take 1 tablet (150 mg total) by mouth 2 (two) times daily.    Dispense:  60 tablet    Refill:  3  . citalopram (CELEXA) 20 MG tablet    Sig: Take 1 tablet (20 mg total) by mouth daily.    Dispense:  30 tablet    Refill:  3  . potassium chloride SA (K-DUR,KLOR-CON) 20 MEQ tablet    Sig: Take 1 tablet (20 mEq total) by mouth daily.    Dispense:  30 tablet    Refill:  2  . albuterol (PROVENTIL HFA;VENTOLIN HFA) 108 (90 Base) MCG/ACT inhaler    Sig: Inhale 2 puffs into the lungs every 6 (six) hours as needed for wheezing or shortness of breath.    Dispense:  54 g    Refill:  0  . tamsulosin (FLOMAX) 0.4 MG CAPS capsule    Sig: Take 1 capsule (0.4 mg total) by mouth daily.    Dispense:  30 capsule    Refill:  3    Follow-up: Return in about 3 months (around 10/09/2016) for follow up on chronic medical conditions.   [name] MD

## 2016-07-14 LAB — PROTIME-INR
INR: 1.4 — ABNORMAL HIGH
PROTHROMBIN TIME: 14.4 s — AB (ref 9.0–11.5)

## 2016-07-18 ENCOUNTER — Telehealth: Payer: Self-pay | Admitting: *Deleted

## 2016-07-18 NOTE — Telephone Encounter (Signed)
-----   Message from [name], MD sent at 07/17/2016 12:20 PM EST ----- Liver enzymes are elevated and so are his platelets which could be due to his cirrhosis

## 2016-07-18 NOTE — Telephone Encounter (Signed)
Patient verified DOB Patient is aware of liver enzymes and platelets being elevated due to cirrhosis. Patient expressed his understanding and had no further questions at this time.

## 2016-10-04 ENCOUNTER — Emergency Department (HOSPITAL_COMMUNITY)
Admission: EM | Admit: 2016-10-04 | Discharge: 2016-10-04 | Disposition: A | Discharge status: Home / Self Care | Payer: Medicaid Other | Attending: Emergency Medicine | Admitting: Emergency Medicine

## 2016-10-04 DIAGNOSIS — Z5321 Procedure and treatment not carried out due to patient leaving prior to being seen by health care provider: Secondary | ICD-10-CM | POA: Insufficient documentation

## 2016-10-04 DIAGNOSIS — N4889 Other specified disorders of penis: Secondary | ICD-10-CM | POA: Insufficient documentation

## 2016-10-04 LAB — URINALYSIS, ROUTINE W REFLEX MICROSCOPIC
BILIRUBIN URINE: NEGATIVE
Glucose, UA: NEGATIVE mg/dL
Ketones, ur: NEGATIVE mg/dL
Nitrite: NEGATIVE
Protein, ur: NEGATIVE mg/dL
Specific Gravity, Urine: 1.002 — ABNORMAL LOW (ref 1.005–1.030)
Squamous Epithelial / LPF: NONE SEEN
pH: 6 (ref 5.0–8.0)

## 2016-10-04 MED ORDER — ALBUTEROL SULFATE (2.5 MG/3ML) 0.083% IN NEBU: 5.0000 mg | INHALATION_SOLUTION | Freq: Once | RESPIRATORY_TRACT | Status: DC

## 2016-10-04 NOTE — ED Triage Notes (Signed)
Per EMS, pt c/o penile pain radiating to scrotum, lower abdomen, back; dysuria, difficulty initiating urinary stream; oliguria; emesis x 1 month. No penile discharge or bleeding. Diagnosed with kidney infection 1 month ago but has been unable to get his medications. Drank 2 beers today. Hx liver cirrhosis.

## 2016-10-09 ENCOUNTER — Ambulatory Visit: Payer: Self-pay | Admitting: Family Medicine

## 2016-11-10 ENCOUNTER — Inpatient Hospital Stay (HOSPITAL_COMMUNITY)
Admission: EM | Admit: 2016-11-10 | Discharge: 2016-11-12 | DRG: 690 | Disposition: A | Discharge status: Home / Self Care | Payer: Medicaid Other | Attending: Internal Medicine | Admitting: Internal Medicine

## 2016-11-10 ENCOUNTER — Encounter (HOSPITAL_COMMUNITY): Payer: Self-pay | Admitting: Emergency Medicine

## 2016-11-10 DIAGNOSIS — K219 Gastro-esophageal reflux disease without esophagitis: Secondary | ICD-10-CM | POA: Diagnosis present

## 2016-11-10 DIAGNOSIS — J449 Chronic obstructive pulmonary disease, unspecified: Secondary | ICD-10-CM | POA: Diagnosis present

## 2016-11-10 DIAGNOSIS — Z833 Family history of diabetes mellitus: Secondary | ICD-10-CM

## 2016-11-10 DIAGNOSIS — F10231 Alcohol dependence with withdrawal delirium: Secondary | ICD-10-CM | POA: Diagnosis present

## 2016-11-10 DIAGNOSIS — E871 Hypo-osmolality and hyponatremia: Secondary | ICD-10-CM

## 2016-11-10 DIAGNOSIS — E876 Hypokalemia: Secondary | ICD-10-CM | POA: Diagnosis present

## 2016-11-10 DIAGNOSIS — K7031 Alcoholic cirrhosis of liver with ascites: Secondary | ICD-10-CM | POA: Diagnosis present

## 2016-11-10 DIAGNOSIS — R339 Retention of urine, unspecified: Secondary | ICD-10-CM

## 2016-11-10 DIAGNOSIS — E222 Syndrome of inappropriate secretion of antidiuretic hormone: Secondary | ICD-10-CM | POA: Diagnosis present

## 2016-11-10 DIAGNOSIS — F419 Anxiety disorder, unspecified: Secondary | ICD-10-CM | POA: Diagnosis present

## 2016-11-10 DIAGNOSIS — R0602 Shortness of breath: Secondary | ICD-10-CM

## 2016-11-10 DIAGNOSIS — I10 Essential (primary) hypertension: Secondary | ICD-10-CM | POA: Diagnosis present

## 2016-11-10 DIAGNOSIS — R110 Nausea: Secondary | ICD-10-CM

## 2016-11-10 DIAGNOSIS — F17210 Nicotine dependence, cigarettes, uncomplicated: Secondary | ICD-10-CM | POA: Diagnosis present

## 2016-11-10 DIAGNOSIS — N390 Urinary tract infection, site not specified: Principal | ICD-10-CM

## 2016-11-10 DIAGNOSIS — K7030 Alcoholic cirrhosis of liver without ascites: Secondary | ICD-10-CM | POA: Diagnosis present

## 2016-11-10 LAB — COMPREHENSIVE METABOLIC PANEL
ALBUMIN: 1.8 g/dL — AB (ref 3.5–5.0)
ALK PHOS: 121 U/L (ref 38–126)
ALT: 15 U/L — ABNORMAL LOW (ref 17–63)
ANION GAP: 5 (ref 5–15)
AST: 36 U/L (ref 15–41)
BILIRUBIN TOTAL: 1.5 mg/dL — AB (ref 0.3–1.2)
BUN: 5 mg/dL — ABNORMAL LOW (ref 6–20)
CALCIUM: 7.4 mg/dL — AB (ref 8.9–10.3)
CO2: 19 mmol/L — ABNORMAL LOW (ref 22–32)
Chloride: 101 mmol/L (ref 101–111)
Creatinine, Ser: 0.67 mg/dL (ref 0.61–1.24)
GFR calc non Af Amer: >60 mL/min (ref >60)
Glucose, Bld: 92 mg/dL (ref 65–99)
POTASSIUM: 3.3 mmol/L — AB (ref 3.5–5.1)
SODIUM: 125 mmol/L — AB (ref 135–145)
TOTAL PROTEIN: 5.9 g/dL — AB (ref 6.5–8.1)

## 2016-11-10 LAB — CBC
HEMATOCRIT: 33.7 % — AB (ref 39.0–52.0)
HEMOGLOBIN: 11.2 g/dL — AB (ref 13.0–17.0)
MCH: 33.3 pg (ref 26.0–34.0)
MCHC: 33.2 g/dL (ref 30.0–36.0)
MCV: 100.3 fL — ABNORMAL HIGH (ref 78.0–100.0)
Platelets: 154 10*3/uL (ref 150–400)
RBC: 3.36 MIL/uL — AB (ref 4.22–5.81)
RDW: 15 % (ref 11.5–15.5)
WBC: 12.9 10*3/uL — ABNORMAL HIGH (ref 4.0–10.5)

## 2016-11-10 LAB — LIPASE, BLOOD: Lipase: 47 U/L (ref 11–51)

## 2016-11-10 NOTE — ED Provider Notes (Signed)
[facility] Provider Note   CSN: [id number] Arrival date & time: 11/10/16  2116     History   Chief Complaint Chief Complaint  Patient presents with  . Abdominal Pain    HPI [name] is a 56 y.o. male.  Patient with of urinary retention. Patient states that he has been not able to urinate for 2 days. He reports associated lower abdominal pain. He denies any associated fevers, chills, nausea, or vomiting. He states that this has happened to him once or twice past. He has not taken anything for symptoms. There are no modifying factors.   The history is provided by the patient. No language interpreter was used.    Past Medical History:  Diagnosis Date  . Cirrhosis of liver (HCC)   . Constipation   . COPD (chronic obstructive pulmonary disease) (HCC)   . Depression   . Dyspnea   . ETOH abuse   . GERD (gastroesophageal reflux disease)   . Hypertension     Patient Active Problem List   Diagnosis Date Noted  . Acute renal failure with tubular necrosis (HCC) 03/25/2016  . Pulmonary nodule 03/24/2016  . Cirrhosis (HCC) 03/24/2016  . Acute kidney injury (HCC) 03/24/2016  . Umbilical hernia, incarcerated 02/07/2016  . GERD (gastroesophageal reflux disease) 11/22/2015  . Depression 09/28/2015  . Protein-calorie malnutrition, severe 09/20/2015  . Chronic liver disease and cirrhosis (HCC)   . AKI (acute kidney injury) (HCC) 09/16/2015  . Chronic respiratory failure (HCC) 09/06/2015  . Hypocalcemia 09/06/2015  . Diffuse abdominal pain 09/06/2015  . Ascites 09/05/2015  . SBP (spontaneous bacterial peritonitis) (HCC) 09/05/2015  . Protein-calorie malnutrition (HCC) 07/22/2015  . Palliative care encounter   . Goals of care, counseling/discussion   . Abdominal discomfort   . Pancreatitis, acute 04/22/2015  . Hypokalemia 04/22/2015  . Hypomagnesemia 04/22/2015  . Decompensated hepatic cirrhosis (HCC) 04/21/2015  . Healthcare-associated pneumonia 04/21/2015  .  Hyponatremia with excess extracellular fluid volume 04/21/2015  . Tobacco abuse 04/20/2015  . Alcoholic cirrhosis of liver with ascites (HCC)   . Alcohol abuse 03/12/2015  . Thrombocytopenia (HCC) 03/12/2015  . Alcohol dependence (HCC) 08/25/2013    Past Surgical History:  Procedure Laterality Date  . IR GENERIC HISTORICAL  03/24/2016   IR TIPS 03/24/2016 [name], MD [facility]  . IR GENERIC HISTORICAL  03/15/2016   IR RADIOLOGIST EVAL & MGMT 03/15/2016 [name], MD [facility]  . IR GENERIC HISTORICAL  11/03/2015   IR TIPS 11/03/2015 [facility] OUT REF  . IR GENERIC HISTORICAL  06/27/2016   IR RADIOLOGIST EVAL & MGMT 06/27/2016 [name], MD [facility]  . MANDIBLE FRACTURE SURGERY  30 yrs. ago   pt. states he has a steele plate  . NO PAST SURGERIES     jaw surgery 4758yrs ago  . RADIOLOGY WITH ANESTHESIA N/A 03/24/2016   Procedure: TIPS;  Surgeon: [name], MD;  [facility];  Service: Radiology;  Laterality: N/A;  . UMBILICAL HERNIA REPAIR N/A 03/25/2016   Procedure: HERNIA REPAIR UMBILICAL INCARCERATED;  Surgeon: [name], MD;  Location: [facility] OR;  Service: General;  Laterality: N/A;       Home Medications    Prior to Admission medications   Medication Sig Start Date End Date Taking? Authorizing Provider  albuterol (PROVENTIL HFA;VENTOLIN HFA) 108 (90 Base) MCG/ACT inhaler Inhale 2 puffs into the lungs every 6 (six) hours as needed for wheezing or shortness of breath. 07/12/16   Jaclyn ShaggyAmao, [name], MD  citalopram (  CELEXA) 20 MG tablet Take 1 tablet (20 mg total) by mouth daily. 07/12/16   [name], MD  diclofenac sodium (VOLTAREN) 1 % GEL Apply 4 g topically 4 (four) times daily as needed (for pain). 04/01/16   Ghimire, Werner Lean, MD  feeding supplement, ENSURE ENLIVE, (ENSURE ENLIVE) LIQD Take 237 mLs by mouth 3 (three) times daily between meals. 04/01/16   Ghimire, Werner Lean, MD  furosemide (LASIX) 20 MG tablet Take 1 tablet (20 mg total) by  mouth daily. 07/12/16   [name], MD  lactulose (CHRONULAC) 10 GM/15ML solution Take 30 mLs (20 g total) by mouth 3 (three) times daily. 07/12/16   [name], MD  ondansetron (ZOFRAN) 4 MG tablet Take 1 tablet (4 mg total) by mouth every 8 (eight) hours as needed for nausea or vomiting. 04/18/16   [name], MD  potassium chloride SA (K-DUR,KLOR-CON) 20 MEQ tablet Take 1 tablet (20 mEq total) by mouth daily. 07/12/16   [name], MD  ranitidine (ZANTAC) 150 MG tablet Take 1 tablet (150 mg total) by mouth 2 (two) times daily. 07/12/16   [name], MD  spironolactone (ALDACTONE) 50 MG tablet Take 1 tablet (50 mg total) by mouth daily. 07/12/16   [name], MD  tamsulosin (FLOMAX) 0.4 MG CAPS capsule Take 1 capsule (0.4 mg total) by mouth daily. 07/12/16   [name], MD  VOLTAREN 1 % GEL APPLY 4 GRAMS TOPICALLY 4 TIMES DAILY. 04/05/16   [name], MD    Family History Family History  Problem Relation Age of Onset  . Diabetes Mother     Social History Social History  Substance Use Topics  . Smoking status: Current Every Day Smoker    Packs/day: 0.50    Years: 0.50    Types: Cigarettes  . Smokeless tobacco: Never Used     Comment: 6-8 cigs daily  . Alcohol use No     Comment: " a couple swigs of beer the other day"     Allergies   Patient has no known allergies.   Review of Systems Review of Systems  All other systems reviewed and are negative.    Physical Exam Updated Vital Signs BP 92/66   Pulse 87   Temp 97.8 F (36.6 C) (Oral)   Resp 20   SpO2 92%   Physical Exam  Constitutional: He is oriented to person, place, and time. He appears well-developed and well-nourished.  HENT:  Head: Normocephalic and atraumatic.  Eyes: Conjunctivae and EOM are normal. Pupils are equal, round, and reactive to light. Right eye exhibits no discharge. Left eye exhibits no discharge. No scleral icterus.  Neck: Normal range of motion. Neck supple. No JVD present.   Cardiovascular: Normal rate, regular rhythm and normal heart sounds.  Exam reveals no gallop and no friction rub.   No murmur heard. Pulmonary/Chest: Effort normal and breath sounds normal. No respiratory distress. He has no wheezes. He has no rales. He exhibits no tenderness.  Abdominal: Soft. He exhibits distension and mass. There is no tenderness. There is no rebound and no guarding.  Musculoskeletal: Normal range of motion. He exhibits no edema or tenderness.  Neurological: He is alert and oriented to person, place, and time.  Skin: Skin is warm and dry.  Psychiatric: He has a normal mood and affect. His behavior is normal. Judgment and thought content normal.  Nursing note and vitals reviewed.    ED Treatments / Results  Labs (all labs ordered are listed, but only  abnormal results are displayed) Labs Reviewed  CBC - Abnormal; Notable for the following:       Result Value   WBC 12.9 (*)    RBC 3.36 (*)    Hemoglobin 11.2 (*)    HCT 33.7 (*)    MCV 100.3 (*)    All other components within normal limits  LIPASE, BLOOD  COMPREHENSIVE METABOLIC PANEL  URINALYSIS, ROUTINE W REFLEX MICROSCOPIC    EKG  EKG Interpretation None       Radiology No results found.  Procedures Procedures (including critical care time)  Medications Ordered in ED Medications - No data to display   Initial Impression / Assessment and Plan / ED Course  I have reviewed the triage vital signs and the nursing notes.  Pertinent labs & imaging results that were available during my care of the patient were reviewed by me and considered in my medical decision making (see chart for details).     Patient with chief complaint of urinary retention. He has been unable to urinate for 2 days. Bladder scan reveals 1 L of urine. Will insert Foley catheter, check labs, urinalysis, and reassess.  Bladder drains approximately 1700 cc of urine.  Appreciate Dr. Montez Morita for brain patient to the hospital for  hyponatremia. Patient given 1 L of normal saline as well as Rocephin for UTI.  Final Clinical Impressions(s) / ED Diagnoses   Final diagnoses:  Hyponatremia  Urinary retention    New Prescriptions New Prescriptions   No medications on file     [place], [place], Cordelia Poche 11/11/16 0139    Doug Sou, MD 11/15/16 [phone]

## 2016-11-10 NOTE — ED Notes (Addendum)
Bladder scanner reading >[id number]

## 2016-11-10 NOTE — ED Triage Notes (Signed)
C/o recurrent abd pain and abd distention.  Pt has history of cirrhosis.  Reports burning with urination over the past week and urinary retention x 2 days.

## 2016-11-11 ENCOUNTER — Encounter (HOSPITAL_COMMUNITY): Payer: Self-pay | Admitting: *Deleted

## 2016-11-11 ENCOUNTER — Observation Stay (HOSPITAL_COMMUNITY): Payer: Medicaid Other

## 2016-11-11 DIAGNOSIS — E871 Hypo-osmolality and hyponatremia: Secondary | ICD-10-CM | POA: Diagnosis not present

## 2016-11-11 DIAGNOSIS — R338 Other retention of urine: Secondary | ICD-10-CM

## 2016-11-11 DIAGNOSIS — E876 Hypokalemia: Secondary | ICD-10-CM | POA: Diagnosis not present

## 2016-11-11 DIAGNOSIS — K219 Gastro-esophageal reflux disease without esophagitis: Secondary | ICD-10-CM | POA: Diagnosis present

## 2016-11-11 DIAGNOSIS — R339 Retention of urine, unspecified: Secondary | ICD-10-CM | POA: Diagnosis not present

## 2016-11-11 DIAGNOSIS — K7030 Alcoholic cirrhosis of liver without ascites: Secondary | ICD-10-CM | POA: Diagnosis present

## 2016-11-11 DIAGNOSIS — K7031 Alcoholic cirrhosis of liver with ascites: Secondary | ICD-10-CM

## 2016-11-11 DIAGNOSIS — Z833 Family history of diabetes mellitus: Secondary | ICD-10-CM | POA: Diagnosis not present

## 2016-11-11 DIAGNOSIS — R0602 Shortness of breath: Secondary | ICD-10-CM

## 2016-11-11 DIAGNOSIS — F419 Anxiety disorder, unspecified: Secondary | ICD-10-CM | POA: Diagnosis present

## 2016-11-11 DIAGNOSIS — I10 Essential (primary) hypertension: Secondary | ICD-10-CM | POA: Diagnosis present

## 2016-11-11 DIAGNOSIS — F17210 Nicotine dependence, cigarettes, uncomplicated: Secondary | ICD-10-CM | POA: Diagnosis present

## 2016-11-11 DIAGNOSIS — F10231 Alcohol dependence with withdrawal delirium: Secondary | ICD-10-CM | POA: Diagnosis present

## 2016-11-11 DIAGNOSIS — E222 Syndrome of inappropriate secretion of antidiuretic hormone: Secondary | ICD-10-CM | POA: Diagnosis present

## 2016-11-11 DIAGNOSIS — R110 Nausea: Secondary | ICD-10-CM

## 2016-11-11 DIAGNOSIS — J449 Chronic obstructive pulmonary disease, unspecified: Secondary | ICD-10-CM | POA: Diagnosis present

## 2016-11-11 DIAGNOSIS — N390 Urinary tract infection, site not specified: Secondary | ICD-10-CM

## 2016-11-11 LAB — BASIC METABOLIC PANEL
ANION GAP: 5 (ref 5–15)
BUN: <5 mg/dL — ABNORMAL LOW (ref 6–20)
CALCIUM: 7.4 mg/dL — AB (ref 8.9–10.3)
CO2: 24 mmol/L (ref 22–32)
Chloride: 108 mmol/L (ref 101–111)
Creatinine, Ser: 0.73 mg/dL (ref 0.61–1.24)
GLUCOSE: 91 mg/dL (ref 65–99)
Potassium: 3 mmol/L — ABNORMAL LOW (ref 3.5–5.1)
SODIUM: 137 mmol/L (ref 135–145)

## 2016-11-11 LAB — URINALYSIS, ROUTINE W REFLEX MICROSCOPIC
Bilirubin Urine: NEGATIVE
GLUCOSE, UA: NEGATIVE mg/dL
Ketones, ur: NEGATIVE mg/dL
NITRITE: NEGATIVE
PH: 6 (ref 5.0–8.0)
PROTEIN: NEGATIVE mg/dL
SPECIFIC GRAVITY, URINE: 1.003 — AB (ref 1.005–1.030)
Squamous Epithelial / LPF: NONE SEEN

## 2016-11-11 LAB — BRAIN NATRIURETIC PEPTIDE: B NATRIURETIC PEPTIDE 5: 454.7 pg/mL — AB (ref 0.0–100.0)

## 2016-11-11 LAB — CBC
HCT: 35 % — ABNORMAL LOW (ref 39.0–52.0)
Hemoglobin: 11.6 g/dL — ABNORMAL LOW (ref 13.0–17.0)
MCH: 33.3 pg (ref 26.0–34.0)
MCHC: 33.1 g/dL (ref 30.0–36.0)
MCV: 100.6 fL — AB (ref 78.0–100.0)
Platelets: 133 10*3/uL — ABNORMAL LOW (ref 150–400)
RBC: 3.48 MIL/uL — AB (ref 4.22–5.81)
RDW: 15.1 % (ref 11.5–15.5)
WBC: 7.1 10*3/uL (ref 4.0–10.5)

## 2016-11-11 LAB — PROTIME-INR
INR: 1.41
Prothrombin Time: 17.3 seconds — ABNORMAL HIGH (ref 11.4–15.2)

## 2016-11-11 MED ORDER — THIAMINE HCL 100 MG/ML IJ SOLN: 100.0000 mg | Freq: Every day | INTRAMUSCULAR | Status: DC

## 2016-11-11 MED ORDER — FAMOTIDINE 20 MG PO TABS
20.0000 mg | ORAL_TABLET | Freq: Two times a day (BID) | ORAL | Status: DC
  Administered 2016-11-11 – 2016-11-12 (×3): 20 mg via ORAL
  Filled 2016-11-11 (×3): qty 1

## 2016-11-11 MED ORDER — CITALOPRAM HYDROBROMIDE 20 MG PO TABS
20.0000 mg | ORAL_TABLET | Freq: Every day | ORAL | Status: DC
  Administered 2016-11-11 – 2016-11-12 (×2): 20 mg via ORAL
  Filled 2016-11-11 (×2): qty 1

## 2016-11-11 MED ORDER — DOCUSATE SODIUM 100 MG PO CAPS
200.0000 mg | ORAL_CAPSULE | Freq: Two times a day (BID) | ORAL | Status: DC
  Administered 2016-11-11 – 2016-11-12 (×2): 200 mg via ORAL
  Filled 2016-11-11 (×2): qty 2

## 2016-11-11 MED ORDER — LACTULOSE 10 GM/15ML PO SOLN
20.0000 g | Freq: Three times a day (TID) | ORAL | Status: DC
  Administered 2016-11-11 (×3): 20 g via ORAL
  Filled 2016-11-11 (×4): qty 30

## 2016-11-11 MED ORDER — POTASSIUM CHLORIDE IN NACL 40-0.9 MEQ/L-% IV SOLN
INTRAVENOUS | Status: DC
  Administered 2016-11-11: 100 mL/h via INTRAVENOUS
  Filled 2016-11-11: qty 1000

## 2016-11-11 MED ORDER — BISACODYL 10 MG RE SUPP
10.0000 mg | Freq: Every day | RECTAL | Status: DC
  Filled 2016-11-11: qty 1

## 2016-11-11 MED ORDER — PHENAZOPYRIDINE HCL 100 MG PO TABS
100.0000 mg | ORAL_TABLET | Freq: Once | ORAL | Status: AC
  Administered 2016-11-11: 100 mg via ORAL
  Filled 2016-11-11 (×2): qty 1

## 2016-11-11 MED ORDER — DEXTROSE 5 % IV SOLN
1.0000 g | Freq: Once | INTRAVENOUS | Status: AC
  Administered 2016-11-11: 1 g via INTRAVENOUS
  Filled 2016-11-11: qty 10

## 2016-11-11 MED ORDER — LORAZEPAM 2 MG/ML IJ SOLN
0.0000 mg | Freq: Two times a day (BID) | INTRAMUSCULAR | Status: DC
Start: 2016-11-13 — End: 2016-11-12

## 2016-11-11 MED ORDER — ONDANSETRON HCL 4 MG PO TABS: 4.0000 mg | ORAL_TABLET | Freq: Four times a day (QID) | ORAL | Status: DC | PRN

## 2016-11-11 MED ORDER — OXYCODONE HCL 5 MG PO TABS
5.0000 mg | ORAL_TABLET | Freq: Four times a day (QID) | ORAL | Status: DC | PRN
  Administered 2016-11-12: 5 mg via ORAL
  Filled 2016-11-11 (×2): qty 1

## 2016-11-11 MED ORDER — LORAZEPAM 1 MG PO TABS
1.0000 mg | ORAL_TABLET | Freq: Four times a day (QID) | ORAL | Status: DC | PRN
  Administered 2016-11-11 – 2016-11-12 (×2): 1 mg via ORAL
  Filled 2016-11-11 (×3): qty 1

## 2016-11-11 MED ORDER — LORAZEPAM 2 MG/ML IJ SOLN
1.0000 mg | Freq: Once | INTRAMUSCULAR | Status: AC
  Administered 2016-11-11: 1 mg via INTRAVENOUS
  Filled 2016-11-11: qty 1

## 2016-11-11 MED ORDER — VITAMIN B-1 100 MG PO TABS
100.0000 mg | ORAL_TABLET | Freq: Every day | ORAL | Status: DC
  Administered 2016-11-12: 100 mg via ORAL
  Filled 2016-11-11: qty 1

## 2016-11-11 MED ORDER — IPRATROPIUM-ALBUTEROL 0.5-2.5 (3) MG/3ML IN SOLN: 3.0000 mL | Freq: Four times a day (QID) | RESPIRATORY_TRACT | Status: DC | PRN

## 2016-11-11 MED ORDER — SPIRONOLACTONE 50 MG PO TABS
50.0000 mg | ORAL_TABLET | Freq: Every day | ORAL | Status: DC
Start: 2016-11-11 — End: 2016-11-11

## 2016-11-11 MED ORDER — DEXTROSE 5 % IV SOLN
1.0000 g | INTRAVENOUS | Status: DC
  Administered 2016-11-11: 1 g via INTRAVENOUS
  Filled 2016-11-11 (×2): qty 10

## 2016-11-11 MED ORDER — LORAZEPAM 2 MG/ML IJ SOLN: 0.0000 mg | Freq: Four times a day (QID) | INTRAMUSCULAR | Status: DC

## 2016-11-11 MED ORDER — FUROSEMIDE 20 MG PO TABS: 20.0000 mg | ORAL_TABLET | Freq: Every day | ORAL | Status: DC

## 2016-11-11 MED ORDER — SODIUM CHLORIDE 0.9 % IV BOLUS (SEPSIS)
1000.0000 mL | Freq: Once | INTRAVENOUS | Status: AC
  Administered 2016-11-11: 1000 mL via INTRAVENOUS

## 2016-11-11 MED ORDER — SODIUM CHLORIDE 0.9 % IV SOLN
INTRAVENOUS | Status: AC
  Administered 2016-11-11: 15:00:00 via INTRAVENOUS

## 2016-11-11 MED ORDER — ADULT MULTIVITAMIN W/MINERALS CH
1.0000 | ORAL_TABLET | Freq: Every day | ORAL | Status: DC
  Administered 2016-11-12: 1 via ORAL
  Filled 2016-11-11: qty 1

## 2016-11-11 MED ORDER — POTASSIUM CHLORIDE CRYS ER 20 MEQ PO TBCR
20.0000 meq | EXTENDED_RELEASE_TABLET | Freq: Every day | ORAL | Status: DC
  Administered 2016-11-11 – 2016-11-12 (×2): 20 meq via ORAL
  Filled 2016-11-11 (×2): qty 1

## 2016-11-11 MED ORDER — LORAZEPAM 2 MG/ML IJ SOLN: 1.0000 mg | Freq: Four times a day (QID) | INTRAMUSCULAR | Status: DC | PRN

## 2016-11-11 MED ORDER — TAMSULOSIN HCL 0.4 MG PO CAPS
0.4000 mg | ORAL_CAPSULE | Freq: Every day | ORAL | Status: DC
  Administered 2016-11-11 – 2016-11-12 (×2): 0.4 mg via ORAL
  Filled 2016-11-11 (×2): qty 1

## 2016-11-11 MED ORDER — FOLIC ACID 1 MG PO TABS
1.0000 mg | ORAL_TABLET | Freq: Every day | ORAL | Status: DC
  Administered 2016-11-12: 1 mg via ORAL
  Filled 2016-11-11: qty 1

## 2016-11-11 MED ORDER — ONDANSETRON HCL 4 MG/2ML IJ SOLN
4.0000 mg | Freq: Four times a day (QID) | INTRAMUSCULAR | Status: DC | PRN
  Administered 2016-11-11: 4 mg via INTRAVENOUS
  Filled 2016-11-11: qty 2

## 2016-11-11 MED ORDER — CHLORDIAZEPOXIDE HCL 5 MG PO CAPS
10.0000 mg | ORAL_CAPSULE | Freq: Three times a day (TID) | ORAL | Status: DC
  Administered 2016-11-11 – 2016-11-12 (×3): 10 mg via ORAL
  Filled 2016-11-11 (×3): qty 2

## 2016-11-11 NOTE — ED Notes (Signed)
Lab will add on INR 

## 2016-11-11 NOTE — Progress Notes (Signed)
Low bed ordered.

## 2016-11-11 NOTE — Progress Notes (Signed)
Pharmacy Antibiotic Note  Bryce MawRobert [name] is a 56 y.o. male admitted on 11/10/2016 with UTI.  Pharmacy has been consulted for Rocephin dosing.  Plan: Rocephin 1g IV Q24H.  Pharmacy will sign off.  Weight: 162 lb (73.5 kg)  Temp (24hrs), Avg:97.9 F (36.6 C), Min:97.8 F (36.6 C), Max:97.9 F (36.6 C)   Recent Labs Lab 11/10/16 2139  WBC 12.9*  CREATININE 0.67    Estimated Creatinine Clearance: 108.5 mL/min (by C-G formula based on SCr of 0.67 mg/dL).    No Known Allergies    Thank you for allowing pharmacy to be a part of this patient's care.  [name], PharmD, BCPS  11/11/2016 3:59 AM

## 2016-11-11 NOTE — H&P (Signed)
History and Physical    Bryce MawRobert [name] ZOX:[id number] DOB: Oct 19, 1960 DOA: 11/10/2016  PCP: Jaclyn ShaggyAmao, [name], MD   Patient coming from: Home  Chief Complaint: Abdominal pain and distention  HPI: [name] is a 56 y.o. gentleman with history of alcoholic cirrhosis, COPD, HTN, and GERD who presented to the ED for evaluation of progressive abdominal pain and distention since Tuesday.  He has had decreased urinary output as well as dysuria.  No gross hematuria.  No fevers but he has had chills and sweats.  He has had nausea and vomiting with decreased PO intake.  He reports one syncopal episode last week.  No chest pain but he has chronic SOB which he attributes to his COPD.  ED Course: The patient was found to have acute urinary retention.  1800cc of urine out after the foley catheter was placed.  WBC count 12.9.  Sodium 125.  Potassium 3.3.  Calcium 7.4, albumin 1.8.  Calcium corrects to 9.2.  Normal BUN and creatinine.  LFTs unremarkable.  U/A shows large leukocytes though nitrite negative, TNTC WBC, many bacteria.  The patient received 1L NS and IV Rocephin.  Hospitalist asked to admit.  Review of Systems: As per HPI otherwise 10 systems reviewed and negative.   Past Medical History:  Diagnosis Date  . Cirrhosis of liver (HCC)   . Constipation   . COPD (chronic obstructive pulmonary disease) (HCC)   . Depression   . Dyspnea   . ETOH abuse   . GERD (gastroesophageal reflux disease)   . Hypertension     Past Surgical History:  Procedure Laterality Date  . IR GENERIC HISTORICAL  03/24/2016   IR TIPS 03/24/2016 [name], MD [facility]INTER[facility] RAD  . IR GENERIC HISTORICAL  03/15/2016   IR RADIOLOGIST EVAL & MGMT 03/15/2016 [name], MD [facility] INTER[facility]  . IR GENERIC HISTORICAL  11/03/2015   IR TIPS 11/03/2015 [facility] OUT REF  . IR GENERIC HISTORICAL  06/27/2016   IR RADIOLOGIST EVAL & MGMT 06/27/2016 [name], MD [facility] INTER[facility] RAD  . MANDIBLE FRACTURE SURGERY  30 yrs. ago   pt.  states he has a steele plate  . NO PAST SURGERIES     jaw surgery 6997yrs ago  . RADIOLOGY WITH ANESTHESIA N/A 03/24/2016   Procedure: TIPS;  Surgeon: [name], MD;  [facility];  Service: Radiology;  Laterality: N/A;  . UMBILICAL HERNIA REPAIR N/A 03/25/2016   Procedure: HERNIA REPAIR UMBILICAL INCARCERATED;  Surgeon: [name], MD;  Location: [facility] OR;  Service: General;  Laterality: N/A;     reports that he has been smoking Cigarettes.  He has a 0.25 pack-year smoking history. He has never used smokeless tobacco. He reports that he does not drink alcohol or use drugs.  No Known Allergies  Family History  Problem Relation Age of Onset  . Diabetes Mother      Prior to Admission medications   Medication Sig Start Date End Date Taking? Authorizing Provider  albuterol (PROVENTIL HFA;VENTOLIN HFA) 108 (90 Base) MCG/ACT inhaler Inhale 2 puffs into the lungs every 6 (six) hours as needed for wheezing or shortness of breath. 07/12/16   Jaclyn ShaggyAmao, [name], MD  citalopram (CELEXA) 20 MG tablet Take 1 tablet (20 mg total) by mouth daily. 07/12/16   Jaclyn ShaggyAmao, [name], MD  diclofenac sodium (VOLTAREN) 1 % GEL Apply 4 g topically 4 (four) times daily as needed (for pain). 04/01/16   Ghimire, Werner LeanShanker M, MD  feeding supplement, ENSURE ENLIVE, (ENSURE ENLIVE) LIQD Take 237  mLs by mouth 3 (three) times daily between meals. 04/01/16   Ghimire, Werner Lean, MD  furosemide (LASIX) 20 MG tablet Take 1 tablet (20 mg total) by mouth daily. 07/12/16   [name], MD  lactulose (CHRONULAC) 10 GM/15ML solution Take 30 mLs (20 g total) by mouth 3 (three) times daily. 07/12/16   [name], MD  ondansetron (ZOFRAN) 4 MG tablet Take 1 tablet (4 mg total) by mouth every 8 (eight) hours as needed for nausea or vomiting. 04/18/16   [name], MD  potassium chloride SA (K-DUR,KLOR-CON) 20 MEQ tablet Take 1 tablet (20 mEq total) by mouth daily. 07/12/16   [name], MD  ranitidine (ZANTAC) 150 MG tablet Take 1  tablet (150 mg total) by mouth 2 (two) times daily. 07/12/16   [name], MD  spironolactone (ALDACTONE) 50 MG tablet Take 1 tablet (50 mg total) by mouth daily. 07/12/16   [name], MD  tamsulosin (FLOMAX) 0.4 MG CAPS capsule Take 1 capsule (0.4 mg total) by mouth daily. 07/12/16   [name], MD  VOLTAREN 1 % GEL APPLY 4 GRAMS TOPICALLY 4 TIMES DAILY. 04/05/16   [name], MD    Physical Exam: Vitals:   11/11/16 0215 11/11/16 0230 11/11/16 0245 11/11/16 0300  BP: 109/70 123/77 98/65 109/70  Pulse: 81 91 92 95  Resp: 17 (!) 25 18   Temp:      TempSrc:      SpO2: 98% 97% 94% 97%      Constitutional: NAD, calm, comfortable.  Sleeping soundly when I entered the room. Vitals:   11/11/16 0215 11/11/16 0230 11/11/16 0245 11/11/16 0300  BP: 109/70 123/77 98/65 109/70  Pulse: 81 91 92 95  Resp: 17 (!) 25 18   Temp:      TempSrc:      SpO2: 98% 97% 94% 97%   Eyes: PERRL, lids and conjunctivae normal ENMT: Mucous membranes are moist. Posterior pharynx clear of any exudate or lesions. Normal dentition.  Neck: normal appearance, supple, no masses Respiratory: clear to auscultation bilaterally, no wheezing, no crackles. Normal respiratory effort. No accessory muscle use.  Cardiovascular: Normal rate, regular rhythm, no murmurs / rubs / gallops. Bilateral lower extremity edema. 2+ pedal pulses. No carotid bruits.  GI: abdomen is soft and compressible.  No distention.  No tenderness.  Bowel sounds are present. Musculoskeletal:  No joint deformity in upper and lower extremities. Good ROM, no contractures. Normal muscle tone.  Skin: no rashes, chronic appearing changes in his legs.  Neurologic: No apparent focal deficits. Psychiatric: Normal judgment and insight. Alert and oriented x 3. Normal mood.     Labs on Admission: I have personally reviewed following labs and imaging studies  CBC:  Recent Labs Lab 11/10/16 2139  WBC 12.9*  HGB 11.2*  HCT 33.7*  MCV 100.3*  PLT  154   Basic Metabolic Panel:  Recent Labs Lab 11/10/16 2139  NA 125*  K 3.3*  CL 101  CO2 19*  GLUCOSE 92  BUN 5*  CREATININE 0.67  CALCIUM 7.4*   GFR: CrCl cannot be calculated (Unknown ideal weight.). Liver Function Tests:  Recent Labs Lab 11/10/16 2139  AST 36  ALT 15*  ALKPHOS 121  BILITOT 1.5*  PROT 5.9*  ALBUMIN 1.8*    Recent Labs Lab 11/10/16 2139  LIPASE 47   Coagulation Profile:  Recent Labs Lab 11/10/16 0149  INR 1.41   Urine analysis:    Component Value Date/Time   COLORURINE YELLOW 11/10/2016  2355   APPEARANCEUR CLOUDY (A) 11/10/2016 2355   LABSPEC 1.003 (L) 11/10/2016 2355   PHURINE 6.0 11/10/2016 2355   GLUCOSEU NEGATIVE 11/10/2016 2355   HGBUR SMALL (A) 11/10/2016 2355   BILIRUBINUR NEGATIVE 11/10/2016 2355   KETONESUR NEGATIVE 11/10/2016 2355   PROTEINUR NEGATIVE 11/10/2016 2355   UROBILINOGEN 0.2 04/11/2015 0800   NITRITE NEGATIVE 11/10/2016 2355   LEUKOCYTESUR LARGE (A) 11/10/2016 2355    Assessment/Plan Active Problems:   Alcoholic cirrhosis of liver with ascites (HCC)   Hypokalemia   Hyponatremia   Acute urinary retention   Acute lower UTI      Symptomatic UTI with acute urinary retention but no sepsis --IV Rocephin --Urine culture --Leave foley in place for now --Trial of pyridium for dysuria --Strict I/O --NS with KCl at 100cc/hr --On flomax at baseline  Hypokalemia --Oral replacement  History of cirrhosis --Appears compensated --Hold diuretics for now --Continue home dose of lactulose    DVT prophylaxis: SCDs Code Status: FULL Family Communication: Patient alone in the ED at the time of admission. Disposition Plan: Expect he will go home at discharge. Consults called: NONE Admission status: Place in observation, med surg.   TIME SPENT: 60 minutes   Jerene Bears MD [facility] Pager [phone]  If 7PM-7AM, please contact night-coverage [email or web address] Password  [facility]  11/11/2016, 3:47 AM

## 2016-11-11 NOTE — ED Notes (Signed)
Dr. Manus Gunningancour at bedside to assess pt.  Pt yelling out after EDP left room for tv to be turned back on.  He states, "Why does everyone keep turning my tv off."  TV turned back on and pt encouraged to use the call bell when he needs assistance.

## 2016-11-11 NOTE — Progress Notes (Signed)
@IPLOG         PROGRESS NOTE                                                                                                                                                                                                             Patient Demographics:    [name], is a 56 y.o. male, DOB - 01-23-1961, ZOX:[id number]  Admit date - 11/10/2016   Admitting Physician Michael Litter, MD  Outpatient Primary MD for the patient is [name], MD  LOS - 0  Chief Complaint  Patient presents with  . Abdominal Pain       Brief Narrative   [name] is a 56 y.o. gentleman with history of alcoholic cirrhosis, COPD, HTN, and GERD who presented to the ED for evaluation of progressive abdominal pain and distention since Tuesday.  He has had decreased urinary output as well as dysuria.  No gross hematuria.  No fevers but he has had chills and sweats, and the area was found to have urinary retention with UTI, bladder scan showed 1800 mL of urine requiring Foley catheter placement. Subsequently he went into alcohol withdrawal.   Subjective:    [name] today has, No headache, No chest pain, Does have some suprapubic fullness and discomfort - No Nausea, No new weakness tingling or numbness, No Cough - SOB.     Assessment  & Plan :     1.Acute urinary retention with UTI. Foley placed, on Flomax, continue Rocephin, follow cultures. Supportive care. Will require urology follow-up post discharge.  2. Alcohol abuse with evidence of DTs. He is in early DTs, placed on Librium along with CIWA protocol, counseled to quit alcohol.  3. Anxiety with complains of restlessness and some nausea and shortness of breath. Check chest x-ray, KUB, BNP, EKG, provide oxygen nebulizer treatment and monitor,  4. Hypokalemia. Replace and monitor.  5. GERD. On Pepcid.    Diet : Diet 2 gram sodium Room service appropriate? Yes; Fluid consistency: Thin    Family Communication  : None  Code Status :   Full  Disposition Plan  :  TBD  Consults  :  None  Procedures  :    DVT Prophylaxis  :    Heparin   Lab Results  Component Value Date   PLT 133 (L) 11/11/2016    Inpatient Medications  Scheduled Meds: . chlordiazePOXIDE  10 mg Oral TID  . citalopram  20 mg Oral Daily  . famotidine  20 mg Oral BID  . folic acid  1 mg Oral Daily  . lactulose  20 g Oral TID  . LORazepam  0-4 mg Intravenous Q6H   Followed by  . [START ON 11/13/2016] LORazepam  0-4 mg Intravenous Q12H  . multivitamin with minerals  1 tablet Oral Daily  . potassium chloride SA  20 mEq Oral Daily  . tamsulosin  0.4 mg Oral Daily  . thiamine  100 mg Oral Daily   Or  . thiamine  100 mg Intravenous Daily   Continuous Infusions: . 0.9 % NaCl with KCl 40 mEq / L 100 mL/hr (11/11/16 [id number])  . [START ON 11/12/2016] cefTRIAXone (ROCEPHIN)  IV     PRN Meds:.LORazepam **OR** LORazepam, ondansetron **OR** ondansetron (ZOFRAN) IV, oxyCODONE  Antibiotics  :    Anti-infectives    Start     Dose/Rate Route Frequency Ordered Stop   11/12/16 0000  cefTRIAXone (ROCEPHIN) 1 g in dextrose 5 % 50 mL IVPB     1 g 100 mL/hr over 30 Minutes Intravenous Every 24 hours 11/11/16 0359     11/11/16 0100  cefTRIAXone (ROCEPHIN) 1 g in dextrose 5 % 50 mL IVPB     1 g 100 mL/hr over 30 Minutes Intravenous  Once 11/11/16 0055 11/11/16 0158         Objective:   Vitals:   11/11/16 0245 11/11/16 0300 11/11/16 0348 11/11/16 0900  BP: 98/65 109/70 129/80 127/77  Pulse: 92 95 (!) 102 86  Resp: 18  18 18   Temp:   97.9 F (36.6 C) 99.2 F (37.3 C)  TempSrc:   Oral Oral  SpO2: 94% 97% 96% 96%  Weight:   73.5 kg (162 lb)     Wt Readings from Last 3 Encounters:  11/11/16 73.5 kg (162 lb)  07/12/16 82.1 kg (181 lb)  06/27/16 73 kg (161 lb)     Intake/Output Summary (Last 24 hours) at 11/11/16 1220 Last data filed at 11/11/16 1000  Gross per 24 hour  Intake             1410 ml  Output             4960 ml  Net            -3550  ml     Physical Exam  Awake Alert, Oriented X 3, No new F.N deficits, anxious affect [place].AT,PERRAL Supple Neck,No JVD, No cervical lymphadenopathy appriciated.  Symmetrical Chest wall movement, Good air movement bilaterally, CTAB RRR,No Gallops,Rubs or new Murmurs, No Parasternal Heave +ve B.Sounds, Abd Soft, No tenderness, No organomegaly appriciated, No rebound - guarding or rigidity. No Cyanosis, Clubbing or edema, No new Rash or bruise       Data Review:    CBC  Recent Labs Lab 11/10/16 2139 11/11/16 0439  WBC 12.9* 7.1  HGB 11.2* 11.6*  HCT 33.7* 35.0*  PLT 154 133*  MCV 100.3* 100.6*  MCH 33.3 33.3  MCHC 33.2 33.1  RDW 15.0 15.1    Chemistries   Recent Labs Lab 11/10/16 2139 11/11/16 0439  NA 125* 137  K 3.3* 3.0*  CL 101 108  CO2 19* 24  GLUCOSE 92 91  BUN 5* <5*  CREATININE 0.67 0.73  CALCIUM 7.4* 7.4*  AST 36  --   ALT 15*  --   ALKPHOS 121  --   BILITOT 1.5*  --    ------------------------------------------------------------------------------------------------------------------ No results for input(s): CHOL, HDL, LDLCALC, TRIG, CHOLHDL, LDLDIRECT in the last 72 hours.  Lab Results  Component Value Date  HGBA1C 4.7 (L) 03/24/2016   ------------------------------------------------------------------------------------------------------------------ No results for input(s): TSH, T4TOTAL, T3FREE, THYROIDAB in the last 72 hours.  Invalid input(s): FREET3 ------------------------------------------------------------------------------------------------------------------ No results for input(s): VITAMINB12, FOLATE, FERRITIN, TIBC, IRON, RETICCTPCT in the last 72 hours.  Coagulation profile  Recent Labs Lab 11/10/16 0149  INR 1.41    No results for input(s): DDIMER in the last 72 hours.  Cardiac Enzymes No results for input(s): CKMB, TROPONINI, MYOGLOBIN in the last 168 hours.  Invalid input(s):  CK ------------------------------------------------------------------------------------------------------------------ No results found for: BNP  Micro Results No results found for this or any previous visit (from the past 240 hour(s)).  Radiology Reports No results found.  Time Spent in minutes  30   Susa Raring M.D on 11/11/2016 at 12:20 PM  Between 7am to 7pm - Pager - [phone] ( page via [email or web address], text pages only, please mention full 10 digit call back number). After 7pm go to [email or web address] - password [facility]

## 2016-11-11 NOTE — Progress Notes (Signed)
Paged Dr. Thedore MinsSingh chest x-ray no active disease.

## 2016-11-11 NOTE — Progress Notes (Signed)
Pt stated felt like couldn't catch breath O2 sat 98 % on 2 lpm via [place].  Hr 124.  LS clear diminish upper lobes. R 24.  Pt stated he is anxious.  Paged Dr. Thedore MinsSingh.

## 2016-11-11 NOTE — ED Notes (Signed)
Pt incontinent episode of urine in bed. Pt and bed cleaned and dressed in fresh set of linin. RN notified.

## 2016-11-11 NOTE — ED Notes (Signed)
1560cc Urine from Cath Emptied out.

## 2016-11-11 NOTE — Progress Notes (Signed)
Pt nauseated, yellow emesis noted. Gave zofran 4 mg IV for nausea.

## 2016-11-12 DIAGNOSIS — E871 Hypo-osmolality and hyponatremia: Secondary | ICD-10-CM

## 2016-11-12 LAB — BASIC METABOLIC PANEL
ANION GAP: 3 — AB (ref 5–15)
BUN: <5 mg/dL — ABNORMAL LOW (ref 6–20)
CALCIUM: 7.3 mg/dL — AB (ref 8.9–10.3)
CO2: 24 mmol/L (ref 22–32)
Chloride: 103 mmol/L (ref 101–111)
Creatinine, Ser: 0.63 mg/dL (ref 0.61–1.24)
GFR calc non Af Amer: >60 mL/min (ref >60)
GLUCOSE: 119 mg/dL — AB (ref 65–99)
POTASSIUM: 3.9 mmol/L (ref 3.5–5.1)
SODIUM: 130 mmol/L — AB (ref 135–145)

## 2016-11-12 LAB — URINE CULTURE

## 2016-11-12 LAB — MAGNESIUM: MAGNESIUM: 1.5 mg/dL — AB (ref 1.7–2.4)

## 2016-11-12 MED ORDER — THIAMINE HCL 100 MG PO TABS: 100.0000 mg | ORAL_TABLET | Freq: Every day | ORAL | 0 refills | Status: DC

## 2016-11-12 MED ORDER — FOLIC ACID 1 MG PO TABS: 1.0000 mg | ORAL_TABLET | Freq: Every day | ORAL | 0 refills | Status: DC

## 2016-11-12 MED ORDER — CHLORDIAZEPOXIDE HCL 5 MG PO CAPS: ORAL_CAPSULE | ORAL | 0 refills | Status: DC

## 2016-11-12 MED ORDER — CEFPODOXIME PROXETIL 200 MG PO TABS: 200.0000 mg | ORAL_TABLET | Freq: Two times a day (BID) | ORAL | 0 refills | Status: DC

## 2016-11-12 NOTE — Discharge Instructions (Signed)
Follow with Primary MD Jaclyn ShaggyAmao, [name], MD in 7 days   Get CBC, CMP, 2 view Chest X ray checked  by Primary MD or SNF MD in 5-7 days ( we routinely change or add medications that can affect your baseline labs and fluid status, therefore we recommend that you get the mentioned basic workup next visit with your PCP, your PCP may decide not to get them or add new tests based on their clinical decision)  Activity: As tolerated with Full fall precautions use walker/cane & assistance as needed  Disposition Home    Diet:  Heart Healthy    For Heart failure patients - Check your Weight same time everyday, if you gain over 2 pounds, or you develop in leg swelling, experience more shortness of breath or chest pain, call your Primary MD immediately. Follow Cardiac Low Salt Diet and 1.5 lit/day fluid restriction.  On your next visit with your primary care physician please Get Medicines reviewed and adjusted.  Please request your Prim.MD to go over all Hospital Tests and Procedure/Radiological results at the follow up, please get all Hospital records sent to your Prim MD by signing hospital release before you go home.  If you experience worsening of your admission symptoms, develop shortness of breath, life threatening emergency, suicidal or homicidal thoughts you must seek medical attention immediately by calling 911 or calling your MD immediately  if symptoms less severe.  You Must read complete instructions/literature along with all the possible adverse reactions/side effects for all the Medicines you take and that have been prescribed to you. Take any new Medicines after you have completely understood and accpet all the possible adverse reactions/side effects.   Do not drive, operate heavy machinery, perform activities at heights, swimming or participation in water activities or provide baby sitting services if your were admitted for syncope or siezures until you have seen by Primary MD or a Neurologist and  advised to do so again.  Do not drive when taking Pain medications.    Do not take more than prescribed Pain, Sleep and Anxiety Medications  Special Instructions: If you have smoked or chewed Tobacco  in the last 2 yrs please stop smoking, stop any regular Alcohol  and or any Recreational drug use.  Wear Seat belts while driving.   Please note  You were cared for by a hospitalist during your hospital stay. If you have any questions about your discharge medications or the care you received while you were in the hospital after you are discharged, you can call the unit and asked to speak with the hospitalist on call if the hospitalist that took care of you is not available. Once you are discharged, your primary care physician will handle any further medical issues. Please note that NO REFILLS for any discharge medications will be authorized once you are discharged, as it is imperative that you return to your primary care physician (or establish a relationship with a primary care physician if you do not have one) for your aftercare needs so that they can reassess your need for medications and monitor your lab values.

## 2016-11-12 NOTE — Clinical Social Work Note (Signed)
CSW met with pt in room to address consult for "Stays in motel,no transportation to doctors appointments." Pt confirms he has no friends or family available to provide transportation for him. CSW provided bus pass and Medicaid transportation handout for assistance with medical appointments. CSW signing off as no further Social Work needs identified.    , LCSWA, LCASA Clinical Social Work (Weekend) [phone] 

## 2016-11-12 NOTE — Discharge Summary (Signed)
[name] ZOX:[id number] DOB: 1960/09/03 DOA: 11/10/2016  PCP: [name], MD  Admit date: 11/10/2016  Discharge date: 11/12/2016  Admitted From: Home   Disposition:  Home   Recommendations for Outpatient Follow-up:   Follow up with PCP in 1-2 weeks  PCP Please obtain BMP/CBC, 2 view CXR in 1week,  (see Discharge instructions)   PCP Please follow up on the following pending results: Needs outpatient urology follow-up   Home Health: None  Equipment/Devices: Foley  Consultations: None Discharge Condition: Stable   CODE STATUS: Full   Diet Recommendation:  Heart Healthy     Chief Complaint  Patient presents with  . Abdominal Pain     Brief history of present illness from the day of admission and additional interim summary    [name] a 56 y.o.gentleman with history of alcoholic cirrhosis, COPD, HTN, and GERD who presented to the ED for evaluation of progressive abdominal pain and distention since Tuesday. He has had decreased urinary output as well as dysuria. No gross hematuria. No fevers but he has had chills and sweats, and the area was found to have urinary retention with UTI, bladder scan showed 1800 mL of urine requiring Foley catheter placement. Subsequently he went into alcohol withdrawal.                                                                 Hospital Course    1. Acute urinary retention with UTI. Foley placed, on Flomax, Responded well to Rocephin and will be transitioned to [place]. Will require urology follow-up post discharge, currently being discharged with indwelling Foley.  2. Alcohol abuse with evidence of mild early DTs. Stable after Librium scheduled, continue Librium taper, Foley casted and thiamine, counseled to quit alcohol.  3.  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 Hypokalemia. Replaced and stable.  5. GERD. On Pepcid.  6. History of alcoholic cirrhosis requiring TIPS procedure. Stable    Discharge diagnosis     Active Problems:   Alcoholic cirrhosis of liver with ascites (HCC)   Hypokalemia   Hyponatremia   Urinary retention   Acute lower UTI    Discharge instructions    Discharge Instructions    Diet - low sodium heart healthy    Complete by:  As directed    Discharge instructions    Complete by:  As directed    Follow with Primary MD [name], MD in 7 days   Get CBC, CMP, 2 view Chest X ray checked  by Primary MD or SNF MD in 5-7 days ( we routinely change or add medications that can affect your baseline labs and fluid status, therefore we recommend that you get the mentioned basic workup next visit with your  PCP, your PCP may decide not to get them or add new tests based on their clinical decision)  Activity: As tolerated with Full fall precautions use walker/cane & assistance as needed  Disposition Home    Diet:  Heart Healthy    For Heart failure patients - Check your Weight same time everyday, if you gain over 2 pounds, or you develop in leg swelling, experience more shortness of breath or chest pain, call your Primary MD immediately. Follow Cardiac Low Salt Diet and 1.5 lit/day fluid restriction.  On your next visit with your primary care physician please Get Medicines reviewed and adjusted.  Please request your Prim.MD to go over all Hospital Tests and Procedure/Radiological results at the follow up, please get all Hospital records sent to your Prim MD by signing hospital release before you go home.  If you experience worsening of your admission symptoms, develop shortness of breath, life threatening emergency, suicidal or homicidal thoughts you must seek medical attention immediately by calling 911 or calling your MD immediately  if symptoms less  severe.  You Must read complete instructions/literature along with all the possible adverse reactions/side effects for all the Medicines you take and that have been prescribed to you. Take any new Medicines after you have completely understood and accpet all the possible adverse reactions/side effects.   Do not drive, operate heavy machinery, perform activities at heights, swimming or participation in water activities or provide baby sitting services if your were admitted for syncope or siezures until you have seen by Primary MD or a Neurologist and advised to do so again.  Do not drive when taking Pain medications.    Do not take more than prescribed Pain, Sleep and Anxiety Medications  Special Instructions: If you have smoked or chewed Tobacco  in the last 2 yrs please stop smoking, stop any regular Alcohol  and or any Recreational drug use.  Wear Seat belts while driving.   Please note  You were cared for by a hospitalist during your hospital stay. If you have any questions about your discharge medications or the care you received while you were in the hospital after you are discharged, you can call the unit and asked to speak with the hospitalist on call if the hospitalist that took care of you is not available. Once you are discharged, your primary care physician will handle any further medical issues. Please note that NO REFILLS for any discharge medications will be authorized once you are discharged, as it is imperative that you return to your primary care physician (or establish a relationship with a primary care physician if you do not have one) for your aftercare needs so that they can reassess your need for medications and monitor your lab values.   Increase activity slowly    Complete by:  As directed       Discharge Medications   Allergies as of 11/12/2016   No Known Allergies     Medication List    TAKE these medications   albuterol 108 (90 Base) MCG/ACT  inhaler Commonly known as:  PROVENTIL HFA;VENTOLIN HFA Inhale 2 puffs into the lungs every 6 (six) hours as needed for wheezing or shortness of breath.   cefpodoxime 200 MG tablet Commonly known as:  VANTIN Take 1 tablet (200 mg total) by mouth 2 (two) times daily.   chlordiazePOXIDE 5 MG capsule Commonly known as:  LIBRIUM Please dispense 18 pills - Take 1 pill three times a day for 3 days, then  Take 1 pill two times a day for 3 days, then Take 1 pill once a day for 3 days and stop.   citalopram 20 MG tablet Commonly known as:  CELEXA Take 1 tablet (20 mg total) by mouth daily.   diclofenac sodium 1 % Gel Commonly known as:  VOLTAREN Apply 4 g topically 4 (four) times daily as needed (for pain).   VOLTAREN 1 % Gel Generic drug:  diclofenac sodium APPLY 4 GRAMS TOPICALLY 4 TIMES DAILY.   feeding supplement (ENSURE ENLIVE) Liqd Take 237 mLs by mouth 3 (three) times daily between meals.   folic acid 1 MG tablet Commonly known as:  FOLVITE Take 1 tablet (1 mg total) by mouth daily. Start taking on:  11/13/2016   furosemide 20 MG tablet Commonly known as:  LASIX Take 1 tablet (20 mg total) by mouth daily.   lactulose 10 GM/15ML solution Commonly known as:  CHRONULAC Take 30 mLs (20 g total) by mouth 3 (three) times daily.   ondansetron 4 MG tablet Commonly known as:  ZOFRAN Take 1 tablet (4 mg total) by mouth every 8 (eight) hours as needed for nausea or vomiting.   potassium chloride SA 20 MEQ tablet Commonly known as:  K-DUR,KLOR-CON Take 1 tablet (20 mEq total) by mouth daily.   ranitidine 150 MG tablet Commonly known as:  ZANTAC Take 1 tablet (150 mg total) by mouth 2 (two) times daily.   spironolactone 50 MG tablet Commonly known as:  ALDACTONE Take 1 tablet (50 mg total) by mouth daily.   tamsulosin 0.4 MG Caps capsule Commonly known as:  FLOMAX Take 1 capsule (0.4 mg total) by mouth daily.   thiamine 100 MG tablet Take 1 tablet (100 mg total) by mouth  daily. Start taking on:  11/13/2016       Follow-up Information    [name], MD. Schedule an appointment as soon as possible for a visit in 1 week(s).   Specialty:  Family Medicine Contact information: [address] [place] [address] [phone]           Major procedures and Radiology Reports - PLEASE review detailed and final reports thoroughly  -         Dg Chest Port 1 View  Result Date: 11/11/2016 CLINICAL DATA:  Dyspnea EXAM: PORTABLE CHEST 1 VIEW COMPARISON:  03/24/2016 chest radiograph. FINDINGS: Right rotated chest radiograph. Stable cardiomediastinal silhouette with normal heart size. No pneumothorax. No pleural effusion. Lungs appear clear, with no acute consolidative airspace disease and no pulmonary edema. IMPRESSION: No active cardiopulmonary disease. Electronically Signed   By: Delbert Phenix M.D.   On: 11/11/2016 14:00   Dg Abd Portable 1v  Result Date: 11/11/2016 CLINICAL DATA:  Nausea EXAM: PORTABLE ABDOMEN - 1 VIEW COMPARISON:  03/24/2016 CT abdomen/pelvis FINDINGS: TIPS is in place in the right upper quadrant of the abdomen. No disproportionately dilated small bowel loops. Mild colonic stool volume. No evidence of pneumatosis or pneumoperitoneum. No radiopaque nephrolithiasis. Clear lung bases. IMPRESSION: Nonobstructive bowel gas pattern. TIPS is in place in the right upper quadrant of the abdomen. Electronically Signed   By: Delbert Phenix M.D.   On: 11/11/2016 14:01    Micro Results    Recent Results (from the past 240 hour(s))  Urine culture     Status: Abnormal   Collection Time: 11/10/16 11:55 PM  Result Value Ref Range Status   Specimen Description URINE, RANDOM  Final   Special Requests NONE  Final  Culture MULTIPLE SPECIES PRESENT, SUGGEST RECOLLECTION (A)  Final   Report Status 11/12/2016 FINAL  Final    Today   Subjective    [name] today has no headache,no chest abdominal pain,no new weakness tingling or numbness,  feels much better wants to go home today.    Objective   Blood pressure 113/66, pulse (!) 104, temperature 97.8 F (36.6 C), temperature source Oral, resp. rate 18, weight 75 kg (165 lb 6.4 oz), SpO2 96 %.   Intake/Output Summary (Last 24 hours) at 11/12/16 1312 Last data filed at 11/12/16 0900  Gross per 24 hour  Intake             1940 ml  Output             2150 ml  Net             -210 ml    Exam Awake Alert, Oriented x 3, No new F.N deficits, Normal affect [place].AT,PERRAL Supple Neck,No JVD, No cervical lymphadenopathy appriciated.  Symmetrical Chest wall movement, Good air movement bilaterally, CTAB RRR,No Gallops,Rubs or new Murmurs, No Parasternal Heave +ve B.Sounds, Abd Soft, Non tender, No organomegaly appriciated, No rebound -guarding or rigidity. Foley in place No Cyanosis, Clubbing or edema, No new Rash or bruise   Data Review   CBC w Diff: Lab Results  Component Value Date   WBC 7.1 11/11/2016   HGB 11.6 (L) 11/11/2016   HCT 35.0 (L) 11/11/2016   HCT 39.5 03/13/2015   PLT 133 (L) 11/11/2016   LYMPHOPCT 37 07/12/2016   MONOPCT 11 07/12/2016   EOSPCT 1 07/12/2016   BASOPCT 1 07/12/2016    CMP: Lab Results  Component Value Date   NA 130 (L) 11/12/2016   K 3.9 11/12/2016   CL 103 11/12/2016   CO2 24 11/12/2016   BUN <5 (L) 11/12/2016   CREATININE 0.63 11/12/2016   CREATININE 0.59 (L) 07/12/2016   PROT 5.9 (L) 11/10/2016   ALBUMIN 1.8 (L) 11/10/2016   BILITOT 1.5 (H) 11/10/2016   ALKPHOS 121 11/10/2016   AST 36 11/10/2016   ALT 15 (L) 11/10/2016  .   Total Time in preparing paper work, data evaluation and todays exam - 35 minutes  Susa Raring M.D on 11/12/2016 at 1:12 PM  [facility]   Office  [phone]

## 2016-11-12 NOTE — Progress Notes (Signed)
Cm received call from unit Rn requesting home health, help with medication, and transportation home from the hospital and transportation help to future medical appointments.  CM notes pt lives in hotel with brother and NO home health agency will accept referral; pt has Medicaid and will not qualify for charity medication asst (MATCH); CM has [place] to please provide bus passes for transportation home and CM has placed MEDICATION MEDICAL APPOINTMENT TRANSPORTATION numbers on pt's AVS with instructions to call 48 hrs prior to a medical appointment and transportation will be provided.  No other CM needs were communicated.

## 2016-11-12 NOTE — Progress Notes (Signed)
Foley catheter drain bag changed to leg bag. Patient verbalized understanding of how to care for foley catheter and empty drain bag. Patient stated he had one before.  Discharge instructions and medications discussed with patient & prescriptions given to patient.  All questions answered.

## 2016-11-13 LAB — HIV ANTIBODY (ROUTINE TESTING W REFLEX): HIV SCREEN 4TH GENERATION: NONREACTIVE

## 2016-11-15 ENCOUNTER — Inpatient Hospital Stay: Payer: Self-pay

## 2016-11-15 ENCOUNTER — Other Ambulatory Visit: Payer: Self-pay | Admitting: Pharmacist

## 2016-11-15 DIAGNOSIS — K219 Gastro-esophageal reflux disease without esophagitis: Secondary | ICD-10-CM

## 2016-11-15 DIAGNOSIS — R339 Retention of urine, unspecified: Secondary | ICD-10-CM

## 2016-11-15 DIAGNOSIS — F339 Major depressive disorder, recurrent, unspecified: Secondary | ICD-10-CM

## 2016-11-15 DIAGNOSIS — K7031 Alcoholic cirrhosis of liver with ascites: Secondary | ICD-10-CM

## 2016-11-15 MED ORDER — CITALOPRAM HYDROBROMIDE 20 MG PO TABS: 20.0000 mg | ORAL_TABLET | Freq: Every day | ORAL | 0 refills | Status: DC

## 2016-11-15 MED ORDER — FUROSEMIDE 20 MG PO TABS: 20.0000 mg | ORAL_TABLET | Freq: Every day | ORAL | 0 refills | Status: DC

## 2016-11-15 MED ORDER — FOLIC ACID 1 MG PO TABS: 1.0000 mg | ORAL_TABLET | Freq: Every day | ORAL | 0 refills | Status: DC

## 2016-11-15 MED ORDER — RANITIDINE HCL 150 MG PO TABS
150.0000 mg | ORAL_TABLET | Freq: Two times a day (BID) | ORAL | 0 refills | Status: DC
Start: 2016-11-15 — End: 2017-02-12

## 2016-11-15 MED ORDER — TAMSULOSIN HCL 0.4 MG PO CAPS: 0.4000 mg | ORAL_CAPSULE | Freq: Every day | ORAL | 0 refills | Status: DC

## 2016-11-15 MED ORDER — THIAMINE HCL 100 MG PO TABS: 100.0000 mg | ORAL_TABLET | Freq: Every day | ORAL | 0 refills | Status: DC

## 2016-11-15 MED ORDER — SPIRONOLACTONE 50 MG PO TABS: 50.0000 mg | ORAL_TABLET | Freq: Every day | ORAL | 0 refills | Status: DC

## 2016-11-15 MED FILL — FUROSEMIDE 20 MG TABLET: 20 | 30 days supply | Qty: 30 | Fill #0

## 2016-11-15 MED FILL — FOLIC ACID 1 MG TABLET: 1 | 30 days supply | Qty: 30 | Fill #0

## 2016-11-15 MED FILL — CITALOPRAM HBR 20 MG TABLET: 20 | 30 days supply | Qty: 30 | Fill #0

## 2016-11-15 MED FILL — raNITIdine HCL 150 MG TABS: 150 | 30 days supply | Qty: 60 | Fill #0

## 2016-11-30 ENCOUNTER — Ambulatory Visit: Payer: Self-pay | Admitting: Family Medicine

## 2016-12-19 ENCOUNTER — Other Ambulatory Visit (HOSPITAL_COMMUNITY): Payer: Self-pay | Admitting: Interventional Radiology

## 2016-12-25 ENCOUNTER — Other Ambulatory Visit (HOSPITAL_COMMUNITY): Payer: Self-pay | Admitting: Interventional Radiology

## 2017-01-11 ENCOUNTER — Emergency Department (HOSPITAL_COMMUNITY)
Admission: EM | Admit: 2017-01-11 | Discharge: 2017-01-11 | Disposition: A | Discharge status: Home / Self Care | Payer: Medicaid Other | Attending: Emergency Medicine | Admitting: Emergency Medicine

## 2017-01-11 ENCOUNTER — Emergency Department (HOSPITAL_COMMUNITY): Payer: Medicaid Other

## 2017-01-11 ENCOUNTER — Encounter (HOSPITAL_COMMUNITY): Payer: Self-pay

## 2017-01-11 DIAGNOSIS — I10 Essential (primary) hypertension: Secondary | ICD-10-CM | POA: Diagnosis not present

## 2017-01-11 DIAGNOSIS — N3000 Acute cystitis without hematuria: Secondary | ICD-10-CM | POA: Insufficient documentation

## 2017-01-11 DIAGNOSIS — R1084 Generalized abdominal pain: Secondary | ICD-10-CM | POA: Diagnosis present

## 2017-01-11 DIAGNOSIS — J449 Chronic obstructive pulmonary disease, unspecified: Secondary | ICD-10-CM | POA: Diagnosis not present

## 2017-01-11 DIAGNOSIS — R339 Retention of urine, unspecified: Secondary | ICD-10-CM | POA: Insufficient documentation

## 2017-01-11 DIAGNOSIS — Z79899 Other long term (current) drug therapy: Secondary | ICD-10-CM | POA: Diagnosis not present

## 2017-01-11 DIAGNOSIS — R531 Weakness: Secondary | ICD-10-CM

## 2017-01-11 DIAGNOSIS — F17210 Nicotine dependence, cigarettes, uncomplicated: Secondary | ICD-10-CM | POA: Diagnosis not present

## 2017-01-11 DIAGNOSIS — R109 Unspecified abdominal pain: Secondary | ICD-10-CM

## 2017-01-11 LAB — COMPREHENSIVE METABOLIC PANEL
ALT: 66 U/L — ABNORMAL HIGH (ref 17–63)
AST: 229 U/L — ABNORMAL HIGH (ref 15–41)
Albumin: 2 g/dL — ABNORMAL LOW (ref 3.5–5.0)
Alkaline Phosphatase: 194 U/L — ABNORMAL HIGH (ref 38–126)
Anion gap: 10 (ref 5–15)
BUN: 5 mg/dL — ABNORMAL LOW (ref 6–20)
CO2: 25 mmol/L (ref 22–32)
Calcium: 7.7 mg/dL — ABNORMAL LOW (ref 8.9–10.3)
Chloride: 100 mmol/L — ABNORMAL LOW (ref 101–111)
Creatinine, Ser: 0.48 mg/dL — ABNORMAL LOW (ref 0.61–1.24)
GFR calc Af Amer: >60 mL/min (ref >60)
GFR calc non Af Amer: >60 mL/min (ref >60)
Glucose, Bld: 115 mg/dL — ABNORMAL HIGH (ref 65–99)
Potassium: 4.2 mmol/L (ref 3.5–5.1)
Sodium: 135 mmol/L (ref 135–145)
Total Bilirubin: 8.5 mg/dL — ABNORMAL HIGH (ref 0.3–1.2)
Total Protein: 7.4 g/dL (ref 6.5–8.1)

## 2017-01-11 LAB — CBC
HCT: 39.6 % (ref 39.0–52.0)
Hemoglobin: 14.1 g/dL (ref 13.0–17.0)
MCH: 34.1 pg — ABNORMAL HIGH (ref 26.0–34.0)
MCHC: 35.6 g/dL (ref 30.0–36.0)
MCV: 95.7 fL (ref 78.0–100.0)
Platelets: 87 10*3/uL — ABNORMAL LOW (ref 150–400)
RBC: 4.14 MIL/uL — ABNORMAL LOW (ref 4.22–5.81)
RDW: 15.9 % — ABNORMAL HIGH (ref 11.5–15.5)
WBC: 12 10*3/uL — ABNORMAL HIGH (ref 4.0–10.5)

## 2017-01-11 LAB — URINALYSIS, ROUTINE W REFLEX MICROSCOPIC
Bilirubin Urine: NEGATIVE
Glucose, UA: NEGATIVE mg/dL
Ketones, ur: NEGATIVE mg/dL
Nitrite: NEGATIVE
Protein, ur: NEGATIVE mg/dL
Specific Gravity, Urine: 1.005 (ref 1.005–1.030)
Squamous Epithelial / LPF: NONE SEEN
pH: 7 (ref 5.0–8.0)

## 2017-01-11 LAB — LIPASE, BLOOD: Lipase: 73 U/L — ABNORMAL HIGH (ref 11–51)

## 2017-01-11 MED ORDER — DEXTROSE 5 % IV SOLN
1.0000 g | Freq: Once | INTRAVENOUS | Status: DC
  Filled 2017-01-11: qty 10

## 2017-01-11 MED ORDER — TAMSULOSIN HCL 0.4 MG PO CAPS: 0.4000 mg | ORAL_CAPSULE | Freq: Every day | ORAL | 0 refills | Status: DC

## 2017-01-11 MED ORDER — CEFTRIAXONE SODIUM 1 G IJ SOLR
1.0000 g | Freq: Once | INTRAMUSCULAR | Status: AC
  Administered 2017-01-11: 1 g via INTRAMUSCULAR
  Filled 2017-01-11: qty 10

## 2017-01-11 MED ORDER — MORPHINE SULFATE (PF) 2 MG/ML IV SOLN
4.0000 mg | Freq: Once | INTRAVENOUS | Status: AC
  Administered 2017-01-11: 4 mg via INTRAVENOUS
  Filled 2017-01-11: qty 2

## 2017-01-11 MED ORDER — IOPAMIDOL (ISOVUE-300) INJECTION 61%
INTRAVENOUS | Status: AC
  Filled 2017-01-11: qty 100

## 2017-01-11 MED ORDER — CEPHALEXIN 500 MG PO CAPS: 500.0000 mg | ORAL_CAPSULE | Freq: Three times a day (TID) | ORAL | 0 refills | Status: DC

## 2017-01-11 MED ORDER — IOPAMIDOL (ISOVUE-300) INJECTION 61%
100.0000 mL | Freq: Once | INTRAVENOUS | Status: AC | PRN
  Administered 2017-01-11: 100 mL via INTRAVENOUS

## 2017-01-11 MED ORDER — LIDOCAINE HCL (PF) 1 % IJ SOLN
INTRAMUSCULAR | Status: AC
  Administered 2017-01-11: 2.1 mL
  Filled 2017-01-11: qty 30

## 2017-01-11 NOTE — ED Notes (Signed)
Discharge instructions reviewed with patient. Patient verbalizes understanding. VSS. Patient provided with bus pass. Patient ambulates well with personal walker without assistance.

## 2017-01-11 NOTE — ED Triage Notes (Signed)
Per EMS- Pt called EMS with c/o abdominal pain that radiates to shoulders and back x 3 week. Reports NV and some diarrhea and poor appetite x 3 weeks. Pt live at a hotel. Pt is alert, oriented and ambulatory with walker. Pt reports burniing on urination and distended abdomen x 3 years .

## 2017-01-11 NOTE — ED Provider Notes (Signed)
[facility]-EMERGENCY DEPT Provider Note    By signing my name below, I, Earmon PhoenixJennifer Waddell, attest that this documentation has been prepared under the direction and in the presence of Raeford RazorKohut, Latrelle Bazar, MD. Electronically Signed: Earmon PhoenixJennifer Waddell, ED Scribe. 01/11/17. 12:30 PM.    History   Chief Complaint Chief Complaint  Patient presents with  . Abdominal Pain   The history is provided by the patient and medical records. No language interpreter was used.    Bryce MawRobert [name] is a 56 y.o. male with PMHx of ETOH abuse and cirrhosis of the liver, brought in by EMS, who presents to the Emergency Department complaining of generalized abdominal pain that began about two weeks ago. He reports associated generalized weakness, SOB and abdominal extension. He reports nausea, vomiting and coughing. He states his abdominal pain radiates into his chest. He reports drinking alcohol daily. He has not taken anything for his symptoms. Lying flat intensifies his pain. There are no alleviating factors noted. He denies urinary complaints. He reports past abdominal surgeries of TIPS and hernia repair.   Past Medical History:  Diagnosis Date  . Cirrhosis of liver (HCC)   . Constipation   . COPD (chronic obstructive pulmonary disease) (HCC)   . Depression   . Dyspnea   . ETOH abuse   . GERD (gastroesophageal reflux disease)   . Hypertension     Patient Active Problem List   Diagnosis Date Noted  . Hyponatremia 11/11/2016  . Urinary retention 11/11/2016  . Acute lower UTI 11/11/2016  . Acute renal failure with tubular necrosis (HCC) 03/25/2016  . Pulmonary nodule 03/24/2016  . Cirrhosis (HCC) 03/24/2016  . Acute kidney injury (HCC) 03/24/2016  . Umbilical hernia, incarcerated 02/07/2016  . GERD (gastroesophageal reflux disease) 11/22/2015  . Depression 09/28/2015  . Protein-calorie malnutrition, severe 09/20/2015  . Chronic liver disease and cirrhosis (HCC)   . AKI (acute kidney injury) (HCC) 09/16/2015  .  Chronic respiratory failure (HCC) 09/06/2015  . Hypocalcemia 09/06/2015  . Diffuse abdominal pain 09/06/2015  . Ascites 09/05/2015  . SBP (spontaneous bacterial peritonitis) (HCC) 09/05/2015  . Protein-calorie malnutrition (HCC) 07/22/2015  . Palliative care encounter   . Goals of care, counseling/discussion   . Abdominal discomfort   . Pancreatitis, acute 04/22/2015  . Hypokalemia 04/22/2015  . Hypomagnesemia 04/22/2015  . Decompensated hepatic cirrhosis (HCC) 04/21/2015  . Healthcare-associated pneumonia 04/21/2015  . Hyponatremia with excess extracellular fluid volume 04/21/2015  . Tobacco abuse 04/20/2015  . Alcoholic cirrhosis of liver with ascites (HCC)   . SOB (shortness of breath)   . Alcohol abuse 03/12/2015  . Thrombocytopenia (HCC) 03/12/2015  . Alcohol dependence (HCC) 08/25/2013    Past Surgical History:  Procedure Laterality Date  . IR GENERIC HISTORICAL  03/24/2016   IR TIPS 03/24/2016 [name], MD [facility]INTERV RAD  . IR GENERIC HISTORICAL  03/15/2016   IR RADIOLOGIST EVAL & MGMT 03/15/2016 [name], MD [facility] INTER[facility] RAD  . IR GENERIC HISTORICAL  11/03/2015   IR TIPS 11/03/2015 [facility]-RAD OUT REF  . IR GENERIC HISTORICAL  06/27/2016   IR RADIOLOGIST EVAL & MGMT 06/27/2016 [name], MD [facility] INTERV RAD  . MANDIBLE FRACTURE SURGERY  30 yrs. ago   pt. states he has a steele plate  . NO PAST SURGERIES     jaw surgery 817yrs ago  . RADIOLOGY WITH ANESTHESIA N/A 03/24/2016   Procedure: TIPS;  Surgeon: [name], MD;  [facility];  Service: Radiology;  Laterality: N/A;  . UMBILICAL HERNIA REPAIR N/A 03/25/2016  Procedure: HERNIA REPAIR UMBILICAL INCARCERATED;  Surgeon: [name], MD;  Location: [facility] OR;  Service: General;  Laterality: N/A;       Home Medications    Prior to Admission medications   Medication Sig Start Date End Date Taking? Authorizing Provider  albuterol (PROVENTIL HFA;VENTOLIN HFA) 108 (90 Base) MCG/ACT inhaler Inhale 2  puffs into the lungs every 6 (six) hours as needed for wheezing or shortness of breath. 07/12/16  Yes [name], MD  cefpodoxime (VANTIN) 200 MG tablet Take 1 tablet (200 mg total) by mouth 2 (two) times daily. 11/12/16  Yes Leroy Sea, MD  chlordiazePOXIDE (LIBRIUM) 5 MG capsule Please dispense 18 pills - Take 1 pill three times a day for 3 days, then Take 1 pill two times a day for 3 days, then Take 1 pill once a day for 3 days and stop. 11/12/16  Yes Leroy Sea, MD  citalopram (CELEXA) 20 MG tablet Take 1 tablet (20 mg total) by mouth daily. 11/15/16  Yes [name], MD  diclofenac sodium (VOLTAREN) 1 % GEL Apply 4 g topically 4 (four) times daily as needed (for pain). 04/01/16  Yes Ghimire, Werner Lean, MD  feeding supplement, ENSURE ENLIVE, (ENSURE ENLIVE) LIQD Take 237 mLs by mouth 3 (three) times daily between meals. 04/01/16  Yes Ghimire, Werner Lean, MD  folic acid (FOLVITE) 1 MG tablet Take 1 tablet (1 mg total) by mouth daily. 11/15/16  Yes [name], MD  furosemide (LASIX) 20 MG tablet Take 1 tablet (20 mg total) by mouth daily. 11/15/16  Yes [name], MD  lactulose (CHRONULAC) 10 GM/15ML solution Take 30 mLs (20 g total) by mouth 3 (three) times daily. Patient taking differently: Take 10 g by mouth 2 (two) times daily.  07/12/16  Yes [name], MD  ondansetron (ZOFRAN) 4 MG tablet Take 1 tablet (4 mg total) by mouth every 8 (eight) hours as needed for nausea or vomiting. 04/18/16  Yes [name], MD  potassium chloride SA (K-DUR,KLOR-CON) 20 MEQ tablet Take 1 tablet (20 mEq total) by mouth daily. 07/12/16  Yes [name], MD  ranitidine (ZANTAC) 150 MG tablet Take 1 tablet (150 mg total) by mouth 2 (two) times daily. 11/15/16  Yes [name], MD  spironolactone (ALDACTONE) 50 MG tablet Take 1 tablet (50 mg total) by mouth daily. 11/15/16  Yes [name], MD  tamsulosin (FLOMAX) 0.4 MG CAPS capsule Take 1 capsule (0.4 mg total) by mouth daily. 11/15/16  Yes  [name], MD  thiamine 100 MG tablet Take 1 tablet (100 mg total) by mouth daily. 11/15/16  Yes [name], [name], MD  VOLTAREN 1 % GEL APPLY 4 GRAMS TOPICALLY 4 TIMES DAILY. 04/05/16   [name], MD    Family History Family History  Problem Relation Age of Onset  . Diabetes Mother     Social History Social History  Substance Use Topics  . Smoking status: Current Every Day Smoker    Packs/day: 0.50    Years: 0.50    Types: Cigarettes  . Smokeless tobacco: Never Used     Comment: 6-8 cigs daily  . Alcohol use No     Comment: " a couple swigs of beer the other day"     Allergies   Patient has no known allergies.   Review of Systems Review of Systems All other systems reviewed and are negative for acute change except as noted in the HPI.   Physical Exam Updated Vital Signs BP (!) 149/92 (  BP Location: Left Arm)   Pulse 94   Temp 98.6 F (37 C) (Oral)   Resp 18   SpO2 99%   Physical Exam  Constitutional: He is oriented to person, place, and time. He appears well-developed and well-nourished.  Disheveled appearance.  HENT:  Head: Normocephalic and atraumatic.  Eyes: EOM are normal.  Neck: Normal range of motion.  Cardiovascular: Normal rate, regular rhythm and normal heart sounds.   Pulmonary/Chest: Effort normal.  Course breath sounds bilaterally, worse on right.   Abdominal: Soft. He exhibits distension. There is tenderness.  Abdominal distension. Diffusely tender. Voluntary guarding with deep palpation.  Musculoskeletal: Normal range of motion.  Neurological: He is alert and oriented to person, place, and time.  Skin: Skin is warm and dry.  Psychiatric: He has a normal mood and affect.  Nursing note and vitals reviewed.    ED Treatments / Results  DIAGNOSTIC STUDIES: Oxygen Saturation is 99% on RA, normal by my interpretation.   COORDINATION OF CARE: 11:46 AM- Will order labs. Pt verbalizes understanding and agrees to plan.  Medications  morphine  2 MG/ML injection 4 mg (not administered)    Labs (all labs ordered are listed, but only abnormal results are displayed) Labs Reviewed  LIPASE, BLOOD  COMPREHENSIVE METABOLIC PANEL  CBC  URINALYSIS, ROUTINE W REFLEX MICROSCOPIC    EKG  EKG Interpretation None       Radiology Dg Chest 2 View  Result Date: 01/11/2017 CLINICAL DATA:  Shortness of breath, cough, congestion, smoking history EXAM: CHEST  2 VIEW COMPARISON:  Chest x-ray of 11/11/2016 FINDINGS: The lungs remain hyperaerated but no pneumonia or effusion is seen. These changes may indicate a degree of emphysema. There is some peribronchial thickening also noted which may indicate bronchitis. Mediastinal and hilar contours are unremarkable. The heart is within normal limits in size. No acute bony abnormality seen, with old partial compression deformities of lower thoracic vertebral bodies appearing stable. IMPRESSION: 1. Hyperaeration may indicate emphysema.  No pneumonia. 2. Peribronchial thickening can be seen with bronchitis . 3. Old partial compression deformities of lower thoracic vertebral bodies. Electronically Signed   By: Dwyane Dee M.D.   On: 01/11/2017 12:22    Procedures Procedures (including critical care time)  Medications Ordered in ED Medications  morphine 2 MG/ML injection 4 mg (not administered)     Initial Impression / Assessment and Plan / ED Course  I have reviewed the triage vital signs and the nursing notes.  Pertinent labs & imaging results that were available during my care of the patient were reviewed by me and considered in my medical decision making (see chart for details).     1:38 PM Did bedside US and actually doesn't have much in terms of ascites. Bladder distended though with layering sludge/debris. 720cc. Probably why he is so uncomfortable. Will place foley.   Has UTI. Abx. Multiple underlying medical problems, but I feel appropriate for oupt tx at this time.   Final Clinical  Impressions(s) / ED Diagnoses   Final diagnoses:  Acute cystitis without hematuria  Urinary retention  Abdominal pain, unspecified abdominal location  Generalized weakness    New Prescriptions New Prescriptions   No medications on file    I personally preformed the services scribed in my presence. The recorded information has been reviewed is accurate. Raeford Razor, MD.    Raeford Razor, MD 01/26/17 1026

## 2017-01-11 NOTE — ED Notes (Signed)
Bed: [id number] Expected date:  Expected time:  Means of arrival:  Comments: Ems 55yo M, Abd pain

## 2017-01-11 NOTE — ED Triage Notes (Signed)
Patient has history of alcoholic cirrhosis of the liver. Patient reports his last drink was 40oz of beer this morning.

## 2017-01-13 LAB — URINE CULTURE

## 2017-01-29 ENCOUNTER — Emergency Department (HOSPITAL_COMMUNITY)
Admission: EM | Admit: 2017-01-29 | Discharge: 2017-01-29 | Disposition: A | Discharge status: Home / Self Care | Payer: Medicaid Other | Attending: Emergency Medicine | Admitting: Emergency Medicine

## 2017-01-29 ENCOUNTER — Encounter (HOSPITAL_COMMUNITY): Payer: Self-pay | Admitting: Emergency Medicine

## 2017-01-29 DIAGNOSIS — R109 Unspecified abdominal pain: Secondary | ICD-10-CM | POA: Diagnosis present

## 2017-01-29 DIAGNOSIS — Z5321 Procedure and treatment not carried out due to patient leaving prior to being seen by health care provider: Secondary | ICD-10-CM | POA: Diagnosis not present

## 2017-01-29 LAB — CBC
HEMATOCRIT: 32.1 % — AB (ref 39.0–52.0)
Hemoglobin: 11.5 g/dL — ABNORMAL LOW (ref 13.0–17.0)
MCH: 36.4 pg — AB (ref 26.0–34.0)
MCHC: 35.8 g/dL (ref 30.0–36.0)
MCV: 101.6 fL — AB (ref 78.0–100.0)
PLATELETS: 86 10*3/uL — AB (ref 150–400)
RBC: 3.16 MIL/uL — AB (ref 4.22–5.81)
RDW: 18 % — ABNORMAL HIGH (ref 11.5–15.5)
WBC: 13.9 10*3/uL — ABNORMAL HIGH (ref 4.0–10.5)

## 2017-01-29 LAB — BASIC METABOLIC PANEL
Anion gap: 7 (ref 5–15)
BUN: 7 mg/dL (ref 6–20)
CHLORIDE: 92 mmol/L — AB (ref 101–111)
CO2: 22 mmol/L (ref 22–32)
CREATININE: 0.43 mg/dL — AB (ref 0.61–1.24)
Calcium: 7.4 mg/dL — ABNORMAL LOW (ref 8.9–10.3)
GFR calc non Af Amer: >60 mL/min (ref >60)
Glucose, Bld: 104 mg/dL — ABNORMAL HIGH (ref 65–99)
POTASSIUM: 4.1 mmol/L (ref 3.5–5.1)
Sodium: 121 mmol/L — ABNORMAL LOW (ref 135–145)

## 2017-01-29 NOTE — ED Notes (Signed)
Pt stated to front desk clerk "he wanted to leave." after being told he was next to go back to be seen. Called pt for room placement no response.

## 2017-01-29 NOTE — ED Notes (Signed)
Pt called with no response 

## 2017-01-29 NOTE — ED Triage Notes (Signed)
Pt brought in by EMS for abdominal pain and generalized body aches. Pt states he was recently seen for abdominal pain and diagnosed with UTI, d/c with urinary catheter. Pt has noticed recent blood in urine. Pt continues with abdominal and back pain.

## 2017-02-02 ENCOUNTER — Inpatient Hospital Stay (HOSPITAL_COMMUNITY): Payer: Medicaid Other

## 2017-02-02 ENCOUNTER — Encounter (HOSPITAL_COMMUNITY): Payer: Self-pay | Admitting: Emergency Medicine

## 2017-02-02 ENCOUNTER — Emergency Department (HOSPITAL_COMMUNITY): Payer: Medicaid Other

## 2017-02-02 ENCOUNTER — Inpatient Hospital Stay (HOSPITAL_COMMUNITY)
Admission: EM | Admit: 2017-02-02 | Discharge: 2017-02-12 | DRG: 871 | Disposition: A | Discharge status: Hospice | Payer: Medicaid Other | Attending: Internal Medicine | Admitting: Internal Medicine

## 2017-02-02 DIAGNOSIS — D6959 Other secondary thrombocytopenia: Secondary | ICD-10-CM | POA: Diagnosis present

## 2017-02-02 DIAGNOSIS — E871 Hypo-osmolality and hyponatremia: Secondary | ICD-10-CM | POA: Diagnosis present

## 2017-02-02 DIAGNOSIS — Z7189 Other specified counseling: Secondary | ICD-10-CM | POA: Diagnosis not present

## 2017-02-02 DIAGNOSIS — K769 Liver disease, unspecified: Secondary | ICD-10-CM | POA: Diagnosis not present

## 2017-02-02 DIAGNOSIS — F1010 Alcohol abuse, uncomplicated: Secondary | ICD-10-CM | POA: Diagnosis not present

## 2017-02-02 DIAGNOSIS — R6521 Severe sepsis with septic shock: Secondary | ICD-10-CM | POA: Diagnosis present

## 2017-02-02 DIAGNOSIS — J189 Pneumonia, unspecified organism: Secondary | ICD-10-CM | POA: Diagnosis present

## 2017-02-02 DIAGNOSIS — K7460 Unspecified cirrhosis of liver: Secondary | ICD-10-CM

## 2017-02-02 DIAGNOSIS — R188 Other ascites: Secondary | ICD-10-CM

## 2017-02-02 DIAGNOSIS — K7040 Alcoholic hepatic failure without coma: Secondary | ICD-10-CM | POA: Diagnosis present

## 2017-02-02 DIAGNOSIS — E43 Unspecified severe protein-calorie malnutrition: Secondary | ICD-10-CM | POA: Diagnosis not present

## 2017-02-02 DIAGNOSIS — Z66 Do not resuscitate: Secondary | ICD-10-CM | POA: Diagnosis present

## 2017-02-02 DIAGNOSIS — N390 Urinary tract infection, site not specified: Secondary | ICD-10-CM | POA: Diagnosis present

## 2017-02-02 DIAGNOSIS — J441 Chronic obstructive pulmonary disease with (acute) exacerbation: Secondary | ICD-10-CM | POA: Diagnosis present

## 2017-02-02 DIAGNOSIS — N1330 Unspecified hydronephrosis: Secondary | ICD-10-CM

## 2017-02-02 DIAGNOSIS — N12 Tubulo-interstitial nephritis, not specified as acute or chronic: Secondary | ICD-10-CM | POA: Diagnosis present

## 2017-02-02 DIAGNOSIS — J440 Chronic obstructive pulmonary disease with acute lower respiratory infection: Secondary | ICD-10-CM | POA: Diagnosis present

## 2017-02-02 DIAGNOSIS — D696 Thrombocytopenia, unspecified: Secondary | ICD-10-CM | POA: Diagnosis not present

## 2017-02-02 DIAGNOSIS — I110 Hypertensive heart disease with heart failure: Secondary | ICD-10-CM | POA: Diagnosis present

## 2017-02-02 DIAGNOSIS — I350 Nonrheumatic aortic (valve) stenosis: Secondary | ICD-10-CM | POA: Diagnosis not present

## 2017-02-02 DIAGNOSIS — I4581 Long QT syndrome: Secondary | ICD-10-CM | POA: Diagnosis present

## 2017-02-02 DIAGNOSIS — Z9119 Patient's noncompliance with other medical treatment and regimen: Secondary | ICD-10-CM

## 2017-02-02 DIAGNOSIS — Y95 Nosocomial condition: Secondary | ICD-10-CM | POA: Diagnosis present

## 2017-02-02 DIAGNOSIS — F329 Major depressive disorder, single episode, unspecified: Secondary | ICD-10-CM | POA: Diagnosis present

## 2017-02-02 DIAGNOSIS — I5033 Acute on chronic diastolic (congestive) heart failure: Secondary | ICD-10-CM | POA: Diagnosis present

## 2017-02-02 DIAGNOSIS — Z6820 Body mass index (BMI) 20.0-20.9, adult: Secondary | ICD-10-CM

## 2017-02-02 DIAGNOSIS — A419 Sepsis, unspecified organism: Secondary | ICD-10-CM | POA: Diagnosis present

## 2017-02-02 DIAGNOSIS — E876 Hypokalemia: Secondary | ICD-10-CM | POA: Diagnosis present

## 2017-02-02 DIAGNOSIS — R52 Pain, unspecified: Secondary | ICD-10-CM

## 2017-02-02 DIAGNOSIS — N029 Recurrent and persistent hematuria with unspecified morphologic changes: Secondary | ICD-10-CM | POA: Diagnosis present

## 2017-02-02 DIAGNOSIS — Z515 Encounter for palliative care: Secondary | ICD-10-CM | POA: Diagnosis present

## 2017-02-02 DIAGNOSIS — E46 Unspecified protein-calorie malnutrition: Secondary | ICD-10-CM | POA: Diagnosis present

## 2017-02-02 DIAGNOSIS — N401 Enlarged prostate with lower urinary tract symptoms: Secondary | ICD-10-CM | POA: Diagnosis present

## 2017-02-02 DIAGNOSIS — J9601 Acute respiratory failure with hypoxia: Secondary | ICD-10-CM | POA: Diagnosis not present

## 2017-02-02 DIAGNOSIS — K7031 Alcoholic cirrhosis of liver with ascites: Secondary | ICD-10-CM | POA: Diagnosis present

## 2017-02-02 DIAGNOSIS — K7011 Alcoholic hepatitis with ascites: Secondary | ICD-10-CM | POA: Diagnosis not present

## 2017-02-02 DIAGNOSIS — F17210 Nicotine dependence, cigarettes, uncomplicated: Secondary | ICD-10-CM | POA: Diagnosis present

## 2017-02-02 DIAGNOSIS — Z9114 Patient's other noncompliance with medication regimen: Secondary | ICD-10-CM

## 2017-02-02 DIAGNOSIS — K219 Gastro-esophageal reflux disease without esophagitis: Secondary | ICD-10-CM | POA: Diagnosis present

## 2017-02-02 DIAGNOSIS — Z4659 Encounter for fitting and adjustment of other gastrointestinal appliance and device: Secondary | ICD-10-CM

## 2017-02-02 DIAGNOSIS — R0600 Dyspnea, unspecified: Secondary | ICD-10-CM

## 2017-02-02 DIAGNOSIS — R338 Other retention of urine: Secondary | ICD-10-CM | POA: Diagnosis present

## 2017-02-02 DIAGNOSIS — E860 Dehydration: Secondary | ICD-10-CM | POA: Diagnosis present

## 2017-02-02 DIAGNOSIS — Z79899 Other long term (current) drug therapy: Secondary | ICD-10-CM

## 2017-02-02 LAB — URINALYSIS, ROUTINE W REFLEX MICROSCOPIC
Glucose, UA: NEGATIVE mg/dL
Glucose, UA: NEGATIVE mg/dL
KETONES UR: NEGATIVE mg/dL
Ketones, ur: NEGATIVE mg/dL
Nitrite: POSITIVE — AB
Nitrite: POSITIVE — AB
PH: 6 (ref 5.0–8.0)
Protein, ur: NEGATIVE mg/dL
Protein, ur: 30 mg/dL — AB
SPECIFIC GRAVITY, URINE: 1.006 (ref 1.005–1.030)
SPECIFIC GRAVITY, URINE: 1.009 (ref 1.005–1.030)
SQUAMOUS EPITHELIAL / LPF: NONE SEEN
pH: 7 (ref 5.0–8.0)

## 2017-02-02 LAB — COMPREHENSIVE METABOLIC PANEL
ALT: 50 U/L (ref 17–63)
AST: 152 U/L — ABNORMAL HIGH (ref 15–41)
Albumin: 1.4 g/dL — ABNORMAL LOW (ref 3.5–5.0)
Alkaline Phosphatase: 123 U/L (ref 38–126)
Anion gap: 11 (ref 5–15)
BILIRUBIN TOTAL: 17.8 mg/dL — AB (ref 0.3–1.2)
BUN: 6 mg/dL (ref 6–20)
CHLORIDE: 89 mmol/L — AB (ref 101–111)
CO2: 18 mmol/L — ABNORMAL LOW (ref 22–32)
Calcium: 7.3 mg/dL — ABNORMAL LOW (ref 8.9–10.3)
Creatinine, Ser: 0.49 mg/dL — ABNORMAL LOW (ref 0.61–1.24)
Glucose, Bld: 83 mg/dL (ref 65–99)
POTASSIUM: 3.5 mmol/L (ref 3.5–5.1)
Sodium: 118 mmol/L — CL (ref 135–145)
TOTAL PROTEIN: 6.3 g/dL — AB (ref 6.5–8.1)

## 2017-02-02 LAB — ABO/RH: ABO/RH(D): B POS

## 2017-02-02 LAB — TYPE AND SCREEN
ABO/RH(D): B POS
Antibody Screen: NEGATIVE

## 2017-02-02 LAB — RAPID URINE DRUG SCREEN, HOSP PERFORMED
Amphetamines: NOT DETECTED
BARBITURATES: NOT DETECTED
Benzodiazepines: NOT DETECTED
COCAINE: NOT DETECTED
Opiates: NOT DETECTED
Tetrahydrocannabinol: POSITIVE — AB

## 2017-02-02 LAB — CBC WITH DIFFERENTIAL/PLATELET
BASOS ABS: 0.1 10*3/uL (ref 0.0–0.1)
Basophils Relative: 1 %
EOS PCT: 0 %
Eosinophils Absolute: 0 10*3/uL (ref 0.0–0.7)
HEMATOCRIT: 27.2 % — AB (ref 39.0–52.0)
Hemoglobin: 10.1 g/dL — ABNORMAL LOW (ref 13.0–17.0)
LYMPHS ABS: 1.6 10*3/uL (ref 0.7–4.0)
Lymphocytes Relative: 11 %
MCH: 37 pg — ABNORMAL HIGH (ref 26.0–34.0)
MCHC: 37.1 g/dL — AB (ref 30.0–36.0)
MCV: 99.6 fL (ref 78.0–100.0)
MONO ABS: 1.3 10*3/uL — AB (ref 0.1–1.0)
MONOS PCT: 9 %
NEUTROS PCT: 79 %
Neutro Abs: 11.1 10*3/uL — ABNORMAL HIGH (ref 1.7–7.7)
Platelets: 77 10*3/uL — ABNORMAL LOW (ref 150–400)
RBC: 2.73 MIL/uL — ABNORMAL LOW (ref 4.22–5.81)
RDW: 17 % — AB (ref 11.5–15.5)
WBC: 14.1 10*3/uL — AB (ref 4.0–10.5)

## 2017-02-02 LAB — I-STAT CG4 LACTIC ACID, ED
LACTIC ACID, VENOUS: 4.33 mmol/L — AB (ref 0.5–1.9)
Lactic Acid, Venous: 3.25 mmol/L (ref 0.5–1.9)

## 2017-02-02 LAB — PROTIME-INR
INR: 2.81
PROTHROMBIN TIME: 29.3 s — AB (ref 11.4–15.2)

## 2017-02-02 LAB — LIPASE, BLOOD: LIPASE: 65 U/L — AB (ref 11–51)

## 2017-02-02 LAB — I-STAT TROPONIN, ED: Troponin i, poc: 0.01 ng/mL (ref 0.00–0.08)

## 2017-02-02 LAB — TROPONIN I

## 2017-02-02 LAB — AMMONIA: Ammonia: 85 umol/L — ABNORMAL HIGH (ref 9–35)

## 2017-02-02 LAB — PHOSPHORUS: Phosphorus: 3 mg/dL (ref 2.5–4.6)

## 2017-02-02 LAB — MAGNESIUM: MAGNESIUM: 1.7 mg/dL (ref 1.7–2.4)

## 2017-02-02 MED ORDER — ENSURE ENLIVE PO LIQD
237.0000 mL | Freq: Three times a day (TID) | ORAL | Status: DC
  Administered 2017-02-03 – 2017-02-07 (×4): 237 mL via ORAL

## 2017-02-02 MED ORDER — THIAMINE HCL 100 MG/ML IJ SOLN
100.0000 mg | Freq: Once | INTRAMUSCULAR | Status: AC
Start: 2017-02-02 — End: 2017-02-03
  Administered 2017-02-03: 100 mg via INTRAVENOUS
  Filled 2017-02-02: qty 2

## 2017-02-02 MED ORDER — SODIUM CHLORIDE 0.9 % IV BOLUS (SEPSIS)
250.0000 mL | Freq: Once | INTRAVENOUS | Status: AC
  Administered 2017-02-02: 250 mL via INTRAVENOUS

## 2017-02-02 MED ORDER — MAGNESIUM SULFATE IN D5W 1-5 GM/100ML-% IV SOLN
1.0000 g | Freq: Once | INTRAVENOUS | Status: AC
  Administered 2017-02-02: 1 g via INTRAVENOUS
  Filled 2017-02-02: qty 100

## 2017-02-02 MED ORDER — POTASSIUM CHLORIDE 10 MEQ/100ML IV SOLN
10.0000 meq | INTRAVENOUS | Status: AC
  Administered 2017-02-03 (×2): 10 meq via INTRAVENOUS
  Filled 2017-02-02 (×2): qty 100

## 2017-02-02 MED ORDER — ONDANSETRON HCL 4 MG/2ML IJ SOLN
4.0000 mg | Freq: Once | INTRAMUSCULAR | Status: AC
  Administered 2017-02-02: 4 mg via INTRAVENOUS
  Filled 2017-02-02: qty 2

## 2017-02-02 MED ORDER — ONDANSETRON HCL 4 MG PO TABS: 4.0000 mg | ORAL_TABLET | Freq: Four times a day (QID) | ORAL | Status: DC | PRN

## 2017-02-02 MED ORDER — CEFTRIAXONE SODIUM 2 G IJ SOLR
2.0000 g | INTRAMUSCULAR | Status: DC
  Administered 2017-02-02: 2 g via INTRAVENOUS
  Filled 2017-02-02: qty 2

## 2017-02-02 MED ORDER — CITALOPRAM HYDROBROMIDE 10 MG PO TABS
20.0000 mg | ORAL_TABLET | Freq: Every day | ORAL | Status: DC
  Administered 2017-02-03 – 2017-02-05 (×3): 20 mg via ORAL
  Filled 2017-02-02 (×3): qty 2

## 2017-02-02 MED ORDER — ALBUTEROL SULFATE (2.5 MG/3ML) 0.083% IN NEBU: 2.5000 mg | INHALATION_SOLUTION | RESPIRATORY_TRACT | Status: DC | PRN

## 2017-02-02 MED ORDER — FOLIC ACID 5 MG/ML IJ SOLN
1.0000 mg | Freq: Every day | INTRAMUSCULAR | Status: DC
  Administered 2017-02-03 – 2017-02-05 (×3): 1 mg via INTRAVENOUS
  Filled 2017-02-02 (×3): qty 0.2

## 2017-02-02 MED ORDER — THIAMINE HCL 100 MG/ML IJ SOLN
100.0000 mg | Freq: Every day | INTRAMUSCULAR | Status: DC
  Administered 2017-02-03 – 2017-02-05 (×3): 100 mg via INTRAVENOUS
  Filled 2017-02-02 (×3): qty 2

## 2017-02-02 MED ORDER — FAMOTIDINE 20 MG PO TABS
10.0000 mg | ORAL_TABLET | Freq: Every day | ORAL | Status: DC
  Administered 2017-02-03 – 2017-02-08 (×5): 10 mg via ORAL
  Filled 2017-02-02 (×4): qty 1

## 2017-02-02 MED ORDER — ONDANSETRON HCL 4 MG/2ML IJ SOLN
4.0000 mg | Freq: Four times a day (QID) | INTRAMUSCULAR | Status: DC | PRN
  Administered 2017-02-03: 4 mg via INTRAVENOUS
  Filled 2017-02-02: qty 2

## 2017-02-02 MED ORDER — VANCOMYCIN HCL IN DEXTROSE 750-5 MG/150ML-% IV SOLN
750.0000 mg | Freq: Three times a day (TID) | INTRAVENOUS | Status: DC
  Administered 2017-02-03 – 2017-02-04 (×5): 750 mg via INTRAVENOUS
  Filled 2017-02-02 (×5): qty 150

## 2017-02-02 MED ORDER — SODIUM CHLORIDE 0.9 % IV BOLUS (SEPSIS)
1000.0000 mL | Freq: Once | INTRAVENOUS | Status: AC
  Administered 2017-02-02: 1000 mL via INTRAVENOUS

## 2017-02-02 MED ORDER — LORAZEPAM 2 MG/ML IJ SOLN
2.0000 mg | INTRAMUSCULAR | Status: DC | PRN
  Administered 2017-02-05 – 2017-02-06 (×4): 2 mg via INTRAVENOUS
  Administered 2017-02-06 (×2): 3 mg via INTRAVENOUS
  Administered 2017-02-07 – 2017-02-08 (×6): 2 mg via INTRAVENOUS
  Filled 2017-02-02 (×5): qty 1
  Filled 2017-02-02 (×2): qty 2
  Filled 2017-02-02 (×5): qty 1

## 2017-02-02 MED ORDER — PIPERACILLIN-TAZOBACTAM 3.375 G IVPB
3.3750 g | Freq: Three times a day (TID) | INTRAVENOUS | Status: DC
  Administered 2017-02-03 – 2017-02-04 (×4): 3.375 g via INTRAVENOUS
  Filled 2017-02-02 (×5): qty 50

## 2017-02-02 MED ORDER — LACTULOSE 10 GM/15ML PO SOLN
20.0000 g | Freq: Three times a day (TID) | ORAL | Status: DC
  Administered 2017-02-03 (×3): 20 g via ORAL
  Filled 2017-02-02 (×7): qty 30

## 2017-02-02 MED ORDER — SODIUM CHLORIDE 0.9 % IV SOLN
1.0000 g | Freq: Once | INTRAVENOUS | Status: AC
  Administered 2017-02-02: 1 g via INTRAVENOUS
  Filled 2017-02-02: qty 10

## 2017-02-02 MED ORDER — MAGNESIUM SULFATE 50 % IJ SOLN: 1.0000 g | Freq: Once | INTRAMUSCULAR | Status: DC

## 2017-02-02 MED ORDER — ALBUMIN HUMAN 5 % IV SOLN
12.5000 g | Freq: Once | INTRAVENOUS | Status: AC
  Administered 2017-02-02: 12.5 g via INTRAVENOUS
  Filled 2017-02-02: qty 250

## 2017-02-02 NOTE — ED Notes (Signed)
2LNC applied 

## 2017-02-02 NOTE — ED Notes (Signed)
Bed: [id number] Expected date:  Expected time:  Means of arrival:  Comments: EMS 56 y/o weakness, abd pain

## 2017-02-02 NOTE — H&P (Signed)
[name] ZOX:[id number] DOB: 12/31/1960 DOA: 02/02/2017     PCP: [name], MD   Outpatient Specialists: [facility]   Patient coming from:   home Lives  With family brother   Chief Complaint: Generalized fatigue persistent hematuria  HPI: [name] is a 56 y.o. male with medical history significant of alcoholic liver cirrhosis (s/p TIPS in 02/2016), protein calorie malnutrition, GERD, depression, BPH and urinary retention  , COPD,  medical noncompliance, tobacco abuse    Presented with generalized fatigue and blood in urine for past few days. Continues to have abdominal swelling. Endorses abdominal discomfort. Endorses suprapubic pain and tenderness diarrhea nausea and some vomiting which has been nonbloody. He continues to have dysuria. Denies any fever at home.  He's been having some persistent chest pain is nonexertional and nonradiating and constant for the past 6 days.  Patient has history of long-standing ascites and pedal edema 40 controls secondary to noncompliance with Spironolactone and Lasix. He was seen in January 11 2017 he began was found to have distended bladder which was probably causing pain and was found to have urinary tract infection foley catheter was placed he was discharged on antibiotics but never filled the prescription. Patient continues to drink daily. He was brought by EMS to ER secondary to abdominal pain on September 3 but left AMA.    Regarding pertinent Chronic problems: History of urinary retention status post Foley catheter placement by [facility]. At some point his Foley catheter was discontinued he presented back in June 2018 with urinary retention TIPS done in 2017 Prior admissions complicated by DTs.  She has long-standing history of hyponatremia secondary to SIADH   IN ER:  Temp (24hrs), Avg:97.8 F (36.6 C), Min:97.8 F (36.6 C), Max:97.8 F (36.6 C)      on arrival  ED Triage Vitals  Enc Vitals Group     BP  02/02/17 1723 (!) 92/49     Pulse Rate 02/02/17 1840 83     Resp 02/02/17 1840 (!) 37     Temp 02/02/17 1723 97.8 F (36.6 C)     Temp Source 02/02/17 1723 Oral     SpO2 02/02/17 1723 (!) 88 %     Weight --      Height --      Head Circumference --      Peak Flow --      Pain Score --      Pain Loc --      Pain Edu? --      Excl. in GC? --     Latest RR 17, 92%2L HR 81 BP 89/59 Troponin 0.01 Lactic acid 4.33 Sodium 118 K3.5 bicarbonate 18 creatinine 0.49 protein 6.3 up of and 1.4 calcium 7.3 AST 152 ALT 50 total bilirubin 17.8 up from 8.5 August WBC 14.1 hemoglobin 10.1 platelets 77 down from 86 Lipase 65 INR 2.81  Chest x-ray nonacute Following Medications were ordered in ER: Medications  sodium chloride 0.9 % bolus 1,000 mL (0 mLs Intravenous Stopped 02/02/17 2038)    And  sodium chloride 0.9 % bolus 1,000 mL (0 mLs Intravenous Stopped 02/02/17 2039)    And  sodium chloride 0.9 % bolus 250 mL (250 mLs Intravenous New Bag/Given 02/02/17 2044)  cefTRIAXone (ROCEPHIN) 2 g in dextrose 5 % 50 mL IVPB (0 g Intravenous Stopped 02/02/17 2039)  ondansetron (ZOFRAN) injection 4 mg (4 mg Intravenous Given 02/02/17 1928)  sodium chloride 0.9 % bolus 1,000 mL (0 mLs Intravenous Stopped  02/02/17 2043)    Hospitalist was called for admission for Septic shock in the setting of UTI in decompensated liver failure secondary to alcoholic cirrhosis  Review of Systems:    Pertinent positives include: fatigue, chest pain, loss of appetite, abdominal pain, nausea, vomiting, diarrhea, jaundice  dysuria, change in color of urine urgency or frequency.  straining to urinate.  Constitutional:  No weight loss, night sweats, Fevers, chills, weight loss  HEENT:  No headaches, Difficulty swallowing,Tooth/dental problems,Sore throat,  No sneezing, itching, ear ache, nasal congestion, post nasal drip,  Cardio-vascular:  No  Orthopnea, PND, anasarca, dizziness, palpitations.no Bilateral lower extremity swelling    GI:  No heartburn, indigestion,  change in bowel habits, melena, blood in stool, hematemesis Resp:  no shortness of breath at rest. No dyspnea on exertion, No excess mucus, no productive cough, No non-productive cough, No coughing up of blood.No change in color of mucus.No wheezing. Skin:  no rash or lesions. No GU:  no No flank pain.  Musculoskeletal:  No joint pain or no joint swelling. No decreased range of motion. No back pain.  Psych:  No change in mood or affect. No depression or anxiety. No memory loss.  Neuro: no localizing neurological complaints, no tingling, no weakness, no double vision, no gait abnormality, no slurred speech, no confusion  As per HPI otherwise 10 point review of systems negative.   Past Medical History: Past Medical History:  Diagnosis Date  . Cirrhosis of liver (HCC)   . Constipation   . COPD (chronic obstructive pulmonary disease) (HCC)   . Depression   . Dyspnea   . ETOH abuse   . GERD (gastroesophageal reflux disease)   . Hypertension    Past Surgical History:  Procedure Laterality Date  . IR GENERIC HISTORICAL  03/24/2016   IR TIPS 03/24/2016 [name], MD [facility]  . IR GENERIC HISTORICAL  03/15/2016   IR RADIOLOGIST EVAL & MGMT 03/15/2016 [name], MD [facility]  . IR GENERIC HISTORICAL  11/03/2015   IR TIPS 11/03/2015 [facility] OUT REF  . IR GENERIC HISTORICAL  06/27/2016   IR RADIOLOGIST EVAL & MGMT 06/27/2016 [name], MD [facility]  . MANDIBLE FRACTURE SURGERY  30 yrs. ago   pt. states he has a steele plate  . NO PAST SURGERIES     jaw surgery 3yrs ago  . RADIOLOGY WITH ANESTHESIA N/A 03/24/2016   Procedure: TIPS;  Surgeon: [name], MD;  Location: [facility] OR;  Service: Radiology;  Laterality: N/A;  . UMBILICAL HERNIA REPAIR N/A 03/25/2016   Procedure: HERNIA REPAIR UMBILICAL INCARCERATED;  Surgeon: [name], MD;  Location: [facility] OR;  Service: General;  Laterality: N/A;     Social  History:  Ambulatory walker     reports that he has been smoking Cigarettes.  He has a 0.25 pack-year smoking history. He has never used smokeless tobacco. He reports that he does not drink alcohol or use drugs.  Allergies:  No Known Allergies     Family History:  Family History  Problem Relation Age of Onset  . Diabetes Mother     Medications: Prior to Admission medications   Medication Sig Start Date End Date Taking? Authorizing Provider  albuterol (PROVENTIL HFA;VENTOLIN HFA) 108 (90 Base) MCG/ACT inhaler Inhale 2 puffs into the lungs every 6 (six) hours as needed for wheezing or shortness of breath. 07/12/16   [name], MD  cefpodoxime (VANTIN) 200 MG tablet Take 1 tablet (200 mg total) by mouth  2 (two) times daily. 11/12/16   Leroy SeaSingh, Prashant K, MD  cephALEXin (KEFLEX) 500 MG capsule Take 1 capsule (500 mg total) by mouth 3 (three) times daily. 01/11/17   Raeford RazorKohut, Stephen, MD  chlordiazePOXIDE (LIBRIUM) 5 MG capsule Please dispense 18 pills - Take 1 pill three times a day for 3 days, then Take 1 pill two times a day for 3 days, then Take 1 pill once a day for 3 days and stop. 11/12/16   Leroy SeaSingh, Prashant K, MD  citalopram (CELEXA) 20 MG tablet Take 1 tablet (20 mg total) by mouth daily. 11/15/16   Jaclyn ShaggyAmao, [name], MD  diclofenac sodium (VOLTAREN) 1 % GEL Apply 4 g topically 4 (four) times daily as needed (for pain). 04/01/16   Ghimire, Werner LeanShanker M, MD  feeding supplement, ENSURE ENLIVE, (ENSURE ENLIVE) LIQD Take 237 mLs by mouth 3 (three) times daily between meals. 04/01/16   Ghimire, Werner LeanShanker M, MD  folic acid (FOLVITE) 1 MG tablet Take 1 tablet (1 mg total) by mouth daily. 11/15/16   Jaclyn ShaggyAmao, [name], MD  furosemide (LASIX) 20 MG tablet Take 1 tablet (20 mg total) by mouth daily. 11/15/16   Jaclyn ShaggyAmao, [name], MD  lactulose (CHRONULAC) 10 GM/15ML solution Take 30 mLs (20 g total) by mouth 3 (three) times daily. Patient taking differently: Take 10 g by mouth 2 (two) times daily.  07/12/16   Jaclyn ShaggyAmao,  [name], MD  ondansetron (ZOFRAN) 4 MG tablet Take 1 tablet (4 mg total) by mouth every 8 (eight) hours as needed for nausea or vomiting. 04/18/16   Jaclyn ShaggyAmao, [name], MD  potassium chloride SA (K-DUR,KLOR-CON) 20 MEQ tablet Take 1 tablet (20 mEq total) by mouth daily. 07/12/16   Jaclyn ShaggyAmao, [name], MD  ranitidine (ZANTAC) 150 MG tablet Take 1 tablet (150 mg total) by mouth 2 (two) times daily. 11/15/16   Jaclyn ShaggyAmao, [name], MD  spironolactone (ALDACTONE) 50 MG tablet Take 1 tablet (50 mg total) by mouth daily. 11/15/16   Jaclyn ShaggyAmao, [name], MD  tamsulosin (FLOMAX) 0.4 MG CAPS capsule Take 1 capsule (0.4 mg total) by mouth daily. 11/15/16   Jaclyn ShaggyAmao, [name], MD  tamsulosin (FLOMAX) 0.4 MG CAPS capsule Take 1 capsule (0.4 mg total) by mouth daily. 01/11/17   Raeford RazorKohut, Stephen, MD  thiamine 100 MG tablet Take 1 tablet (100 mg total) by mouth daily. 11/15/16   Jaclyn ShaggyAmao, [name], MD  VOLTAREN 1 % GEL APPLY 4 GRAMS TOPICALLY 4 TIMES DAILY. 04/05/16   Jaclyn ShaggyAmao, [name], MD    Physical Exam: Patient Vitals for the past 24 hrs:  BP Temp Temp src Pulse Resp SpO2  02/02/17 2030 (!) 89/59 - - 81 17 92 %  02/02/17 2000 106/63 - - 81 - 96 %  02/02/17 1938 112/65 - - 81 18 97 %  02/02/17 1930 112/65 - - 82 - 97 %  02/02/17 1840 116/60 - - 83 (!) 37 98 %  02/02/17 1723 (!) 92/49 97.8 F (36.6 C) Oral - - (!) 88 %    1. General:  in No Acute distress Chronically ill -appearing 2. Psychological: Alert and  Oriented 3. Head/ENT:    Dry Mucous Membranes                              Jaundiced                          Head Non traumatic, neck supple  Poor Dentition 4. SKIN:decreased Skin turgor,  Skin clean Dry and intact no rash jaundice present 5. Heart: Regular rate and rhythm no Murmur, no Rub or gallop 6. Lungs:   no wheezes mild crackles   7. Abdomen: Soft,  RUQ and epigastric tenderness, distended bowel sounds present 8. Lower extremities: no clubbing, cyanosis, or edema 9. Neurologically Grossly intact, moving  all 4 extremities equally    10. MSK: Normal range of motion   body mass index is unknown because there is no height or weight on file.  Labs on Admission:   Labs on Admission: I have personally reviewed following labs and imaging studies  CBC:  Recent Labs Lab 01/29/17 1155 02/02/17 1839  WBC 13.9* 14.1*  NEUTROABS  --  11.1*  HGB 11.5* 10.1*  HCT 32.1* 27.2*  MCV 101.6* 99.6  PLT 86* 77*   Basic Metabolic Panel:  Recent Labs Lab 01/29/17 1155 02/02/17 1839  NA 121* 118*  K 4.1 3.5  CL 92* 89*  CO2 22 18*  GLUCOSE 104* 83  BUN 7 6  CREATININE 0.43* 0.49*  CALCIUM 7.4* 7.3*   GFR: CrCl cannot be calculated (Unknown ideal weight.). Liver Function Tests:  Recent Labs Lab 02/02/17 1839  AST 152*  ALT 50  ALKPHOS 123  BILITOT 17.8*  PROT 6.3*  ALBUMIN 1.4*    Recent Labs Lab 02/02/17 1839  LIPASE 65*   No results for input(s): AMMONIA in the last 168 hours. Coagulation Profile:  Recent Labs Lab 02/02/17 1839  INR 2.81   Cardiac Enzymes: No results for input(s): CKTOTAL, CKMB, CKMBINDEX, TROPONINI in the last 168 hours. BNP (last 3 results) No results for input(s): PROBNP in the last 8760 hours. HbA1C: No results for input(s): HGBA1C in the last 72 hours. CBG: No results for input(s): GLUCAP in the last 168 hours. Lipid Profile: No results for input(s): CHOL, HDL, LDLCALC, TRIG, CHOLHDL, LDLDIRECT in the last 72 hours. Thyroid Function Tests: No results for input(s): TSH, T4TOTAL, FREET4, T3FREE, THYROIDAB in the last 72 hours. Anemia Panel: No results for input(s): VITAMINB12, FOLATE, FERRITIN, TIBC, IRON, RETICCTPCT in the last 72 hours. Urine analysis:    Component Value Date/Time   COLORURINE AMBER (A) 02/02/2017 1942   APPEARANCEUR CLOUDY (A) 02/02/2017 1942   LABSPEC 1.009 02/02/2017 1942   PHURINE 6.0 02/02/2017 1942   GLUCOSEU NEGATIVE 02/02/2017 1942   HGBUR SMALL (A) 02/02/2017 1942   BILIRUBINUR MODERATE (A) 02/02/2017  1942   KETONESUR NEGATIVE 02/02/2017 1942   PROTEINUR 30 (A) 02/02/2017 1942   UROBILINOGEN 0.2 04/11/2015 0800   NITRITE POSITIVE (A) 02/02/2017 1942   LEUKOCYTESUR LARGE (A) 02/02/2017 1942   Sepsis Labs: (procalcitonin:4,lacticidven:4) )No results found for this or any previous visit (from the past 240 hour(s)).    UA  evidence of UTI   Lab Results  Component Value Date   HGBA1C 4.7 (L) 03/24/2016    CrCl cannot be calculated (Unknown ideal weight.).  BNP (last 3 results) No results for input(s): PROBNP in the last 8760 hours.   ECG REPORT  Independently reviewed Rate: 77  Rhythm: NSR ST&T Change: No acute ischemic changes    QTC 488  There were no vitals filed for this visit.   Cultures:    Component Value Date/Time   SDES URINE, CLEAN CATCH 01/11/2017 1509   SPECREQUEST NONE 01/11/2017 1509   CULT MULTIPLE SPECIES PRESENT, SUGGEST RECOLLECTION (A) 01/11/2017 1509   REPTSTATUS 01/13/2017 FINAL 01/11/2017 1509     Radiological Exams  on Admission: Dg Chest 2 View  Result Date: 02/02/2017 CLINICAL DATA:  Generalized weakness.  Hematuria.  Malaise. EXAM: CHEST  2 VIEW COMPARISON:  01/11/2017. FINDINGS: Interval borderline enlargement of the cardiac silhouette. Clear lungs. Unremarkable bones. Mild diffuse peribronchial thickening without significant change. IMPRESSION: No acute abnormality.  Stable mild chronic bronchitic changes. Electronically Signed   By: Beckie Salts M.D.   On: 02/02/2017 19:26    Chart has been reviewed    Assessment/Plan   56 y.o. male with medical history significant of alcoholic liver cirrhosis (s/p TIPS in 02/2016), protein calorie malnutrition, GERD, depression, BPH and urinary retention  , COPD,  medical noncompliance, tobacco abuse  Admitted for Septic shock in the setting of UTI in decompensated liver failure secondary to alcoholic cirrhosis   Present on Admission: . Sepsis (HCC) - Admit per Sepsis protocol likely  source being UTI But intra-abdominal source cannot be ruled out at this time  - rehydrate with 35ml/kg  - initiate broad spectrum antibiotics Vancomycin and Zosyn  -  obtain blood cultures  - Obtain serial lactic acid  - Obtain procalcitonin level  - Admit and monitor vital signs closely  -   Sepsis - Repeat Assessment  Performed at:    21:45  Vitals     Blood pressure (!) 102/55, pulse 88, temperature 97.8 F (36.6 C), temperature source Oral, resp. rate 18, height 6' (1.829 m), weight 74.8 kg (165 lb), SpO2 95 %.  Heart:     Regular rate and rhythm  Lungs:    Rales  Capillary Refill:   <2 sec  Peripheral Pulse:   Radial pulse palpable  Skin:     Dry   . Alcohol abuse  - states she's interested in quitting. Order CIWA protocol, social work consult . Alcoholic cirrhosis of liver with ascites (HCC) - with  acute hepatic decompensation in the setting of ongoing alcohol abuse. Very poor prognosis MELD score 32 . GERD (gastroesophageal reflux disease) - continue home medicaitons . Hyponatremia with excess extracellular fluid volume- most likely combination of dehydration and chronic liver failure, at baseline close to 121 patient was rehydrated in emergency department revealed follow and reassess obtain urinary electrolytes avoid over rapid correction currently neurologically asymptomatic . Thrombocytopenia (HCC) secondary to liver failure, currently no indication for transfusion . Protein-calorie malnutrition (HCC) - nutritional consult ordered . Acute lower UTI - for now cover broad-spectrum antibiotics to rule out other sources of possible sepsis await results of urine culture   . Hypocalcemia - will replace Hypomagnesemia -will replace Hypokalemia -will replace   Tobacco abuse recommended discontinuation nursing protocol for tobacco cessation  Other plan as per orders.  DVT prophylaxis:  SCD   Code Status:    DNR/DNI as per patient  spent time discussing overall poor  prognosis. Would benefit from palliative care consult patient has been seen by hospice in the past possible transition to comfort care  Family Communication:   Family not at  Bedside    Disposition Plan:     likely will need placement for rehabilitation                                                Would benefit from PT/OT eval prior to DC   ordered  Social Work   Nutrition  Palliative care   consulted                          Consults called: none    Admission status:   inpatient      Level of care    SDU      I have spent a total of on this admission  Nelsie Domino 02/02/2017, 10:23 PM   [facility]  Pager [phone]   after 2 AM please page floor coverage PA If 7AM-7PM, please contact the day team taking care of the patient  [email or web address]  Password [email or web address]

## 2017-02-02 NOTE — ED Triage Notes (Signed)
Per EMS, patient from home presents with generalized weakness and hematuria. Patient reports malaise and states he needs help with his alcoholism. Abdomen is distended and appears jaundice in both skin tone and sclera. Hx of cirrhosis.

## 2017-02-02 NOTE — ED Notes (Signed)
pts bed has been changed and pt has been cleaned up. We also replaced pts leg bag with a catheter bag.

## 2017-02-02 NOTE — ED Provider Notes (Addendum)
[facility]-EMERGENCY DEPT Provider Note   CSN: [id number] Arrival date & time: 02/02/17  1655     History   Chief Complaint No chief complaint on file.   HPI [name] is a 56 y.o. male.  The history is provided by the patient.  Abdominal Pain   This is a new problem. The problem occurs constantly. Progression since onset: fluctuating. The pain is associated with an unknown factor. The pain is located in the suprapubic region. The pain is moderate. Associated symptoms include diarrhea, nausea, vomiting (bilious, nonbloody), dysuria and hematuria. Pertinent negatives include fever. Nothing aggravates the symptoms. Nothing relieves the symptoms.   Diagnosed with UTI last month and DC'd with ABx. States he did not get the Abx.   Also complains of 6 days of constant substernal, nonradiating, nonexertional chest pain.  Past Medical History:  Diagnosis Date  . Cirrhosis of liver (HCC)   . Constipation   . COPD (chronic obstructive pulmonary disease) (HCC)   . Depression   . Dyspnea   . ETOH abuse   . GERD (gastroesophageal reflux disease)   . Hypertension     Patient Active Problem List   Diagnosis Date Noted  . Hyponatremia 11/11/2016  . Urinary retention 11/11/2016  . Acute lower UTI 11/11/2016  . Acute renal failure with tubular necrosis (HCC) 03/25/2016  . Pulmonary nodule 03/24/2016  . Cirrhosis (HCC) 03/24/2016  . Acute kidney injury (HCC) 03/24/2016  . Umbilical hernia, incarcerated 02/07/2016  . GERD (gastroesophageal reflux disease) 11/22/2015  . Depression 09/28/2015  . Protein-calorie malnutrition, severe 09/20/2015  . Chronic liver disease and cirrhosis (HCC)   . AKI (acute kidney injury) (HCC) 09/16/2015  . Chronic respiratory failure (HCC) 09/06/2015  . Hypocalcemia 09/06/2015  . Diffuse abdominal pain 09/06/2015  . Ascites 09/05/2015  . SBP (spontaneous bacterial peritonitis) (HCC) 09/05/2015  . Protein-calorie malnutrition (HCC) 07/22/2015  . Palliative  care encounter   . Goals of care, counseling/discussion   . Abdominal discomfort   . Pancreatitis, acute 04/22/2015  . Hypokalemia 04/22/2015  . Hypomagnesemia 04/22/2015  . Decompensated hepatic cirrhosis (HCC) 04/21/2015  . Healthcare-associated pneumonia 04/21/2015  . Hyponatremia with excess extracellular fluid volume 04/21/2015  . Tobacco abuse 04/20/2015  . Alcoholic cirrhosis of liver with ascites (HCC)   . SOB (shortness of breath)   . Alcohol abuse 03/12/2015  . Thrombocytopenia (HCC) 03/12/2015  . Alcohol dependence (HCC) 08/25/2013    Past Surgical History:  Procedure Laterality Date  . IR GENERIC HISTORICAL  03/24/2016   IR TIPS 03/24/2016 [name], MD [facility]INTERV RAD  . IR GENERIC HISTORICAL  03/15/2016   IR RADIOLOGIST EVAL & MGMT 03/15/2016 [name], MD [facility] INTER[facility] RAD  . IR GENERIC HISTORICAL  11/03/2015   IR TIPS 11/03/2015 [facility]-RAD OUT REF  . IR GENERIC HISTORICAL  06/27/2016   IR RADIOLOGIST EVAL & MGMT 06/27/2016 [name], MD [facility] INTER[facility] RAD  . MANDIBLE FRACTURE SURGERY  30 yrs. ago   pt. states he has a steele plate  . NO PAST SURGERIES     jaw surgery 35yrs ago  . RADIOLOGY WITH ANESTHESIA N/A 03/24/2016   Procedure: TIPS;  Surgeon: [name], MD;  Location: [facility] OR;  Service: Radiology;  Laterality: N/A;  . UMBILICAL HERNIA REPAIR N/A 03/25/2016   Procedure: HERNIA REPAIR UMBILICAL INCARCERATED;  Surgeon: [name], MD;  Location: [facility] OR;  Service: General;  Laterality: N/A;       Home Medications    Prior to Admission medications   Medication Sig  Start Date End Date Taking? Authorizing Provider  albuterol (PROVENTIL HFA;VENTOLIN HFA) 108 (90 Base) MCG/ACT inhaler Inhale 2 puffs into the lungs every 6 (six) hours as needed for wheezing or shortness of breath. 07/12/16   [name], MD  cefpodoxime (VANTIN) 200 MG tablet Take 1 tablet (200 mg total) by mouth 2 (two) times daily. 11/12/16   Leroy Sea, MD  cephALEXin  (KEFLEX) 500 MG capsule Take 1 capsule (500 mg total) by mouth 3 (three) times daily. 01/11/17   Raeford Razor, MD  chlordiazePOXIDE (LIBRIUM) 5 MG capsule Please dispense 18 pills - Take 1 pill three times a day for 3 days, then Take 1 pill two times a day for 3 days, then Take 1 pill once a day for 3 days and stop. 11/12/16   Leroy Sea, MD  citalopram (CELEXA) 20 MG tablet Take 1 tablet (20 mg total) by mouth daily. 11/15/16   [name], MD  diclofenac sodium (VOLTAREN) 1 % GEL Apply 4 g topically 4 (four) times daily as needed (for pain). 04/01/16   Ghimire, Werner Lean, MD  feeding supplement, ENSURE ENLIVE, (ENSURE ENLIVE) LIQD Take 237 mLs by mouth 3 (three) times daily between meals. 04/01/16   Ghimire, Werner Lean, MD  folic acid (FOLVITE) 1 MG tablet Take 1 tablet (1 mg total) by mouth daily. 11/15/16   [name], MD  furosemide (LASIX) 20 MG tablet Take 1 tablet (20 mg total) by mouth daily. 11/15/16   [name], MD  lactulose (CHRONULAC) 10 GM/15ML solution Take 30 mLs (20 g total) by mouth 3 (three) times daily. Patient taking differently: Take 10 g by mouth 2 (two) times daily.  07/12/16   [name], MD  ondansetron (ZOFRAN) 4 MG tablet Take 1 tablet (4 mg total) by mouth every 8 (eight) hours as needed for nausea or vomiting. 04/18/16   [name], MD  potassium chloride SA (K-DUR,KLOR-CON) 20 MEQ tablet Take 1 tablet (20 mEq total) by mouth daily. 07/12/16   [name], MD  ranitidine (ZANTAC) 150 MG tablet Take 1 tablet (150 mg total) by mouth 2 (two) times daily. 11/15/16   [name], MD  spironolactone (ALDACTONE) 50 MG tablet Take 1 tablet (50 mg total) by mouth daily. 11/15/16   [name], MD  tamsulosin (FLOMAX) 0.4 MG CAPS capsule Take 1 capsule (0.4 mg total) by mouth daily. 11/15/16   [name], MD  tamsulosin (FLOMAX) 0.4 MG CAPS capsule Take 1 capsule (0.4 mg total) by mouth daily. 01/11/17   Raeford Razor, MD  thiamine 100 MG tablet Take 1 tablet  (100 mg total) by mouth daily. 11/15/16   [name], MD  VOLTAREN 1 % GEL APPLY 4 GRAMS TOPICALLY 4 TIMES DAILY. 04/05/16   [name], MD    Family History Family History  Problem Relation Age of Onset  . Diabetes Mother     Social History Social History  Substance Use Topics  . Smoking status: Current Every Day Smoker    Packs/day: 0.50    Years: 0.50    Types: Cigarettes  . Smokeless tobacco: Never Used     Comment: 6-8 cigs daily  . Alcohol use No     Comment: " a couple swigs of beer the other day"     Allergies   Patient has no known allergies.   Review of Systems Review of Systems  Constitutional: Negative for fever.  Gastrointestinal: Positive for abdominal pain, diarrhea, nausea and vomiting (bilious, nonbloody).  Genitourinary:  Positive for dysuria and hematuria.  All other systems are reviewed and are negative for acute change except as noted in the HPI    Physical Exam Updated Vital Signs BP (!) 92/49   Temp 97.8 F (36.6 C) (Oral)   SpO2 (!) 88%   Physical Exam  Constitutional: He is oriented to person, place, and time. He appears well-developed and well-nourished. No distress.  HENT:  Head: Normocephalic and atraumatic.  Nose: Nose normal.  Eyes: Pupils are equal, round, and reactive to light. Conjunctivae and EOM are normal. Right eye exhibits no discharge. Left eye exhibits no discharge. Scleral icterus is present.  Neck: Normal range of motion. Neck supple.  Cardiovascular: Normal rate and regular rhythm.  Exam reveals no gallop and no friction rub.   No murmur heard. Pulmonary/Chest: Effort normal and breath sounds normal. No stridor. No respiratory distress. He has no rales.  Abdominal: Soft. He exhibits no distension. There is hepatomegaly. There is tenderness (mild discomfort) in the suprapubic area. There is no rigidity, no rebound, no guarding, no CVA tenderness, no tenderness at McBurney's point and negative Murphy's sign. No hernia.    Genitourinary:  Genitourinary Comments: Foley in place. Urine cloudy.  Musculoskeletal: He exhibits no edema or tenderness.  Neurological: He is alert and oriented to person, place, and time.  Skin: Skin is warm and dry. No rash noted. He is not diaphoretic. No erythema.  jaundice  Psychiatric: He has a normal mood and affect.  Vitals reviewed.    ED Treatments / Results  Labs (all labs ordered are listed, but only abnormal results are displayed) Labs Reviewed  COMPREHENSIVE METABOLIC PANEL - Abnormal; Notable for the following:       Result Value   Sodium 118 (*)    Chloride 89 (*)    CO2 18 (*)    Creatinine, Ser 0.49 (*)    Calcium 7.3 (*)    Total Protein 6.3 (*)    Albumin 1.4 (*)    AST 152 (*)    Total Bilirubin 17.8 (*)    All other components within normal limits  CBC WITH DIFFERENTIAL/PLATELET - Abnormal; Notable for the following:    WBC 14.1 (*)    RBC 2.73 (*)    Hemoglobin 10.1 (*)    HCT 27.2 (*)    MCH 37.0 (*)    MCHC 37.1 (*)    RDW 17.0 (*)    Platelets 77 (*)    Neutro Abs 11.1 (*)    Monocytes Absolute 1.3 (*)    All other components within normal limits  URINALYSIS, ROUTINE W REFLEX MICROSCOPIC - Abnormal; Notable for the following:    Color, Urine AMBER (*)    APPearance CLOUDY (*)    Hgb urine dipstick SMALL (*)    Bilirubin Urine MODERATE (*)    Nitrite POSITIVE (*)    Leukocytes, UA MODERATE (*)    Bacteria, UA MANY (*)    All other components within normal limits  LIPASE, BLOOD - Abnormal; Notable for the following:    Lipase 65 (*)    All other components within normal limits  PROTIME-INR - Abnormal; Notable for the following:    Prothrombin Time 29.3 (*)    All other components within normal limits  URINALYSIS, ROUTINE W REFLEX MICROSCOPIC - Abnormal; Notable for the following:    Color, Urine AMBER (*)    APPearance CLOUDY (*)    Hgb urine dipstick SMALL (*)    Bilirubin Urine MODERATE (*)  Protein, ur 30 (*)    Nitrite  POSITIVE (*)    Leukocytes, UA LARGE (*)    Bacteria, UA MANY (*)    Squamous Epithelial / LPF 0-5 (*)    All other components within normal limits  I-STAT CG4 LACTIC ACID, ED - Abnormal; Notable for the following:    Lactic Acid, Venous 4.33 (*)    All other components within normal limits  CULTURE, BLOOD (ROUTINE X 2)  CULTURE, BLOOD (ROUTINE X 2)  MAGNESIUM  PHOSPHORUS  CREATININE, URINE, RANDOM  SODIUM, URINE, RANDOM  OSMOLALITY, URINE  RAPID URINE DRUG SCREEN, HOSP PERFORMED  AMMONIA  I-STAT TROPONIN, ED  I-STAT CG4 LACTIC ACID, ED    EKG  EKG Interpretation  Date/Time:  Friday February 02 2017 18:12:14 EDT Ventricular Rate:  77 PR Interval:    QRS Duration: 111 QT Interval:  431 QTC Calculation: 488 R Axis:   91 Text Interpretation:  Sinus rhythm Borderline right axis deviation Borderline prolonged QT interval No significant change since last tracing Confirmed by Drema Pryardama, Jedidiah Demartini [phone]([id number]) on 02/02/2017 6:51:13 PM       Radiology Dg Chest 2 View  Result Date: 02/02/2017 CLINICAL DATA:  Generalized weakness.  Hematuria.  Malaise. EXAM: CHEST  2 VIEW COMPARISON:  01/11/2017. FINDINGS: Interval borderline enlargement of the cardiac silhouette. Clear lungs. Unremarkable bones. Mild diffuse peribronchial thickening without significant change. IMPRESSION: No acute abnormality.  Stable mild chronic bronchitic changes. Electronically Signed   By: Beckie SaltsSteven  Reid M.D.   On: 02/02/2017 19:26    Procedures Procedures (including critical care time)  CRITICAL CARE Performed by: Amadeo GarnetPedro Eduardo Darold Miley Total critical care time: 45 minutes Critical care time was exclusive of separately billable procedures and treating other patients. Critical care was necessary to treat or prevent imminent or life-threatening deterioration. Critical care was time spent personally by me on the following activities: development of treatment plan with patient and/or surrogate as well as nursing,  discussions with consultants, evaluation of patient's response to treatment, examination of patient, obtaining history from patient or surrogate, ordering and performing treatments and interventions, ordering and review of laboratory studies, ordering and review of radiographic studies, pulse oximetry and re-evaluation of patient's condition.   Medications Ordered in ED Medications  cefTRIAXone (ROCEPHIN) 2 g in dextrose 5 % 50 mL IVPB (0 g Intravenous Stopped 02/02/17 2039)  thiamine (B-1) injection 100 mg (not administered)  ondansetron (ZOFRAN) injection 4 mg (4 mg Intravenous Given 02/02/17 1928)  sodium chloride 0.9 % bolus 1,000 mL (0 mLs Intravenous Stopped 02/02/17 2043)  sodium chloride 0.9 % bolus 1,000 mL (0 mLs Intravenous Stopped 02/02/17 2038)    And  sodium chloride 0.9 % bolus 1,000 mL (0 mLs Intravenous Stopped 02/02/17 2039)    And  sodium chloride 0.9 % bolus 250 mL (0 mLs Intravenous Stopped 02/02/17 2105)     Initial Impression / Assessment and Plan / ED Course  I have reviewed the triage vital signs and the nursing notes.  Pertinent labs & imaging results that were available during my care of the patient were reviewed by me and considered in my medical decision making (see chart for details).  Clinical Course as of Feb 03 2107  Fri Feb 02, 2017  1906 Lactic acid greater than 4. Given the hypotension and recent untreated urinary tract infection with associated nausea vomiting and suprapubic abdominal discomfort, code sepsis was initiated. 30 mL/kg of IV fluid ordered. Empiric antibiotics ordered. We'll treat for presumed urinary source. We'll broaden if  needed.    [PC]  1930 Sepsis - Repeat Assessment  Vitals      112/65 /81/ 16 96% on RA  Heart:     Regular rate and rhythm  Lungs:    CTA  Capillary Refill:   <2 sec  Peripheral Pulse:   Radial pulse palpable  Skin:     Jaundiced       [PC]  2026 Confirmed urinary tract infection. Patient also noted to have  significant hyponatremia. Will be receiving IV fluids.   Will admit patient to medicine for further management  [PC]    Clinical Course User Index [PC] Garland Smouse, Amadeo Garnet, MD      Final Clinical Impressions(s) / ED Diagnoses   Final diagnoses:  Sepsis, due to unspecified organism [facility])  Pyelonephritis  Hyponatremia  Hyperbilirubinemia        Akaylah Lalley, Amadeo Garnet, MD 02/02/17 2108

## 2017-02-02 NOTE — ED Notes (Signed)
MD and RN made aware of elevated lactic 

## 2017-02-02 NOTE — Progress Notes (Signed)
Pharmacy Antibiotic Note  Bryce MawRobert [name] is a 56 y.o. male with generalized fatigue and persistent hematuria admitted on 02/02/2017 with sepsis.  Pharmacy has been consulted for zosyn and vancomycin dosing.  Plan: Zosyn 3.375g IV q8h (4 hour infusion).  Vancomycin 750 mg IV q8h VT=15-20 mg/L Daily Scr F/u culture and levels  Height: 6' (182.9 cm) Weight: 165 lb (74.8 kg) IBW/kg (Calculated) : 77.6  Temp (24hrs), Avg:97.8 F (36.6 C), Min:97.8 F (36.6 C), Max:97.8 F (36.6 C)   Recent Labs Lab 01/29/17 1155 02/02/17 1839 02/02/17 1850 02/02/17 2134  WBC 13.9* 14.1*  --   --   CREATININE 0.43* 0.49*  --   --   LATICACIDVEN  --   --  4.33* 3.25*    Estimated Creatinine Clearance: 110.4 mL/min (A) (by C-G formula based on SCr of 0.49 mg/dL (L)).    No Known Allergies  Antimicrobials this admission: 9/7 rocephin >> x1 ED 9/7 zosyn >>  9/7 vancomycin >>   Dose adjustments this admission:   Microbiology results:  BCx:   UCx:    Sputum:    MRSA PCR:   Thank you for allowing pharmacy to be a part of this patient's care.  [name], [name] 02/02/2017 10:51 PM

## 2017-02-02 NOTE — ED Notes (Signed)
Date and time results received: 02/02/17  7:18 PM   Test: Na Critical Value: 118  Name of Provider Notified: Cardama  Orders Received? Or Actions Taken?: See orders

## 2017-02-02 NOTE — ED Notes (Signed)
Bladder scan reading after new foley cath insertion = 23 ml.

## 2017-02-03 ENCOUNTER — Inpatient Hospital Stay (HOSPITAL_COMMUNITY): Payer: Medicaid Other

## 2017-02-03 DIAGNOSIS — Z7189 Other specified counseling: Secondary | ICD-10-CM

## 2017-02-03 DIAGNOSIS — R52 Pain, unspecified: Secondary | ICD-10-CM

## 2017-02-03 DIAGNOSIS — Z515 Encounter for palliative care: Secondary | ICD-10-CM

## 2017-02-03 DIAGNOSIS — I350 Nonrheumatic aortic (valve) stenosis: Secondary | ICD-10-CM

## 2017-02-03 LAB — BASIC METABOLIC PANEL
ANION GAP: 6 (ref 5–15)
ANION GAP: 8 (ref 5–15)
ANION GAP: 9 (ref 5–15)
BUN: 5 mg/dL — ABNORMAL LOW (ref 6–20)
BUN: 5 mg/dL — ABNORMAL LOW (ref 6–20)
BUN: 5 mg/dL — ABNORMAL LOW (ref 6–20)
CHLORIDE: 99 mmol/L — AB (ref 101–111)
CO2: 18 mmol/L — AB (ref 22–32)
CO2: 18 mmol/L — AB (ref 22–32)
CO2: 18 mmol/L — ABNORMAL LOW (ref 22–32)
Calcium: 6.7 mg/dL — ABNORMAL LOW (ref 8.9–10.3)
Calcium: 7.2 mg/dL — ABNORMAL LOW (ref 8.9–10.3)
Calcium: 7.2 mg/dL — ABNORMAL LOW (ref 8.9–10.3)
Chloride: 101 mmol/L (ref 101–111)
Chloride: 97 mmol/L — ABNORMAL LOW (ref 101–111)
Creatinine, Ser: 0.36 mg/dL — ABNORMAL LOW (ref 0.61–1.24)
Creatinine, Ser: 0.4 mg/dL — ABNORMAL LOW (ref 0.61–1.24)
Creatinine, Ser: 0.41 mg/dL — ABNORMAL LOW (ref 0.61–1.24)
GFR calc Af Amer: >60 mL/min (ref >60)
GFR calc Af Amer: >60 mL/min (ref >60)
GFR calc non Af Amer: >60 mL/min (ref >60)
GFR calc non Af Amer: >60 mL/min (ref >60)
GFR calc non Af Amer: >60 mL/min (ref >60)
GLUCOSE: 70 mg/dL (ref 65–99)
GLUCOSE: 81 mg/dL (ref 65–99)
GLUCOSE: 95 mg/dL (ref 65–99)
POTASSIUM: 3.1 mmol/L — AB (ref 3.5–5.1)
POTASSIUM: 3.4 mmol/L — AB (ref 3.5–5.1)
POTASSIUM: 3.7 mmol/L (ref 3.5–5.1)
SODIUM: 124 mmol/L — AB (ref 135–145)
Sodium: 125 mmol/L — ABNORMAL LOW (ref 135–145)
Sodium: 125 mmol/L — ABNORMAL LOW (ref 135–145)

## 2017-02-03 LAB — APTT: aPTT: 73 seconds — ABNORMAL HIGH (ref 24–36)

## 2017-02-03 LAB — COMPREHENSIVE METABOLIC PANEL
ALT: 42 U/L (ref 17–63)
ANION GAP: 8 (ref 5–15)
AST: 130 U/L — ABNORMAL HIGH (ref 15–41)
Albumin: 1.3 g/dL — ABNORMAL LOW (ref 3.5–5.0)
Alkaline Phosphatase: 95 U/L (ref 38–126)
BUN: <5 mg/dL — ABNORMAL LOW (ref 6–20)
CHLORIDE: 97 mmol/L — AB (ref 101–111)
CO2: 18 mmol/L — AB (ref 22–32)
Calcium: 6.7 mg/dL — ABNORMAL LOW (ref 8.9–10.3)
Creatinine, Ser: 0.42 mg/dL — ABNORMAL LOW (ref 0.61–1.24)
GFR calc non Af Amer: >60 mL/min (ref >60)
Glucose, Bld: 67 mg/dL (ref 65–99)
Potassium: 3.2 mmol/L — ABNORMAL LOW (ref 3.5–5.1)
Sodium: 123 mmol/L — ABNORMAL LOW (ref 135–145)
Total Bilirubin: 15.8 mg/dL — ABNORMAL HIGH (ref 0.3–1.2)
Total Protein: 5.4 g/dL — ABNORMAL LOW (ref 6.5–8.1)

## 2017-02-03 LAB — PROTIME-INR
INR: 3.46
Prothrombin Time: 34.6 seconds — ABNORMAL HIGH (ref 11.4–15.2)

## 2017-02-03 LAB — CBC
HEMATOCRIT: 23.7 % — AB (ref 39.0–52.0)
HEMOGLOBIN: 8.8 g/dL — AB (ref 13.0–17.0)
MCH: 38.1 pg — AB (ref 26.0–34.0)
MCHC: 37.1 g/dL — ABNORMAL HIGH (ref 30.0–36.0)
MCV: 102.6 fL — AB (ref 78.0–100.0)
Platelets: 71 10*3/uL — ABNORMAL LOW (ref 150–400)
RBC: 2.31 MIL/uL — AB (ref 4.22–5.81)
RDW: 17.4 % — ABNORMAL HIGH (ref 11.5–15.5)
WBC: 12.2 10*3/uL — ABNORMAL HIGH (ref 4.0–10.5)

## 2017-02-03 LAB — TROPONIN I
Troponin I: <0.03 ng/mL (ref <0.03)
Troponin I: <0.03 ng/mL (ref <0.03)

## 2017-02-03 LAB — PROCALCITONIN: Procalcitonin: 0.18 ng/mL

## 2017-02-03 LAB — CREATININE, URINE, RANDOM: Creatinine, Urine: 26.82 mg/dL

## 2017-02-03 LAB — LACTIC ACID, PLASMA
LACTIC ACID, VENOUS: 2.6 mmol/L — AB (ref 0.5–1.9)
LACTIC ACID, VENOUS: 3.2 mmol/L — AB (ref 0.5–1.9)

## 2017-02-03 LAB — TSH: TSH: 0.416 u[IU]/mL (ref 0.350–4.500)

## 2017-02-03 LAB — LIPASE, BLOOD: LIPASE: 48 U/L (ref 11–51)

## 2017-02-03 LAB — PHOSPHORUS: Phosphorus: 2.5 mg/dL (ref 2.5–4.6)

## 2017-02-03 LAB — ECHOCARDIOGRAM COMPLETE
HEIGHTINCHES: 73 in
WEIGHTICAEL: 2779.56 [oz_av]

## 2017-02-03 LAB — MRSA PCR SCREENING: MRSA by PCR: NEGATIVE

## 2017-02-03 LAB — SODIUM, URINE, RANDOM

## 2017-02-03 LAB — OSMOLALITY, URINE: OSMOLALITY UR: 246 mosm/kg — AB (ref 300–900)

## 2017-02-03 LAB — MAGNESIUM: Magnesium: 1.7 mg/dL (ref 1.7–2.4)

## 2017-02-03 MED ORDER — SODIUM CHLORIDE 0.9 % IV SOLN
INTRAVENOUS | Status: DC
  Administered 2017-02-03 – 2017-02-11 (×6): via INTRAVENOUS

## 2017-02-03 MED ORDER — OXYCODONE HCL 5 MG PO TABS
5.0000 mg | ORAL_TABLET | Freq: Once | ORAL | Status: AC
  Administered 2017-02-03: 5 mg via ORAL
  Filled 2017-02-03: qty 1

## 2017-02-03 MED ORDER — POTASSIUM CHLORIDE 10 MEQ/100ML IV SOLN
10.0000 meq | INTRAVENOUS | Status: AC
  Administered 2017-02-03 (×3): 10 meq via INTRAVENOUS
  Filled 2017-02-03 (×3): qty 100

## 2017-02-03 MED ORDER — ORAL CARE MOUTH RINSE
15.0000 mL | Freq: Two times a day (BID) | OROMUCOSAL | Status: DC
  Administered 2017-02-05 – 2017-02-12 (×12): 15 mL via OROMUCOSAL

## 2017-02-03 MED ORDER — POTASSIUM CHLORIDE CRYS ER 20 MEQ PO TBCR
40.0000 meq | EXTENDED_RELEASE_TABLET | ORAL | Status: DC
  Administered 2017-02-03: 40 meq via ORAL
  Filled 2017-02-03 (×2): qty 2

## 2017-02-03 MED ORDER — OXYCODONE HCL 5 MG PO TABS
5.0000 mg | ORAL_TABLET | Freq: Four times a day (QID) | ORAL | Status: DC | PRN
  Administered 2017-02-03 (×2): 5 mg via ORAL
  Filled 2017-02-03 (×2): qty 1

## 2017-02-03 MED ORDER — SODIUM CHLORIDE 0.9 % IV BOLUS (SEPSIS)
1000.0000 mL | Freq: Once | INTRAVENOUS | Status: AC
  Administered 2017-02-03: 1000 mL via INTRAVENOUS

## 2017-02-03 MED ORDER — MAGNESIUM SULFATE 2 GM/50ML IV SOLN
2.0000 g | Freq: Once | INTRAVENOUS | Status: AC
Start: 2017-02-03 — End: 2017-02-03
  Administered 2017-02-03: 2 g via INTRAVENOUS
  Filled 2017-02-03: qty 50

## 2017-02-03 NOTE — Progress Notes (Signed)
  Echocardiogram 2D Echocardiogram has been performed.  [name] 02/03/2017, 9:31 AM

## 2017-02-03 NOTE — Progress Notes (Signed)
PT Cancellation Note  Patient Details Name: Libby MawRobert Carino MRN: [id number] DOB: 1961-05-09   Cancelled Treatment:     PT order received and PT attempted x2 with pt declining 2* fatigue and "I just feel crummy".  Will follow.   Issachar Broady 02/03/2017, 3:42 PM

## 2017-02-03 NOTE — Progress Notes (Addendum)
CRITICAL VALUE ALERT  Critical Value:  Lacticacid 3.2  Date & Time Notied:  02-03-17 102  Provider Notified: [place]  Orders Received/Actions taken: I L NS bolus ordered

## 2017-02-03 NOTE — Progress Notes (Signed)
OT Cancellation Note  Patient Details Name: Bryce MawRobert Troia MRN: [id number] DOB: 08/19/1960   Cancelled Treatment:    Reason Eval/Treat Not Completed: Other (comment).  Pt states he doesn't feel well today and wants to wait on therapy. Will check back tomorrow if schedule permits, but it may be Monday  [name] 02/03/2017, 2:26 PM  [name], OTR/L [phone] 02/03/2017

## 2017-02-03 NOTE — Progress Notes (Signed)
Patient c/o lower back pain 9/10. Paged Elray McgregorMary Overdorf, NP. New order for one time dose oxycodone 5mg  PO. Given to patient. Continue to monitor.

## 2017-02-03 NOTE — Consult Note (Addendum)
Consultation Note Date: 02/03/2017   Patient Name: [name]  DOB: 1961/01/10  MRN: [id number]  Age / Sex: 56 y.o., male  PCP: [name], MD Referring Physician: Meredeth Ide, MD  Reason for Consultation: Establishing goals of care  HPI/Patient Profile: 56 y.o. male  with past medical history of   admitted on 02/02/2017 with  .  56 y.o.malewith medical history significant of alcoholic liver cirrhosis (s/p TIPS in 02/2016), protein calorie malnutrition, GERD, depression, BPH and urinary retention , COPD, medicalnoncompliance, tobacco abuse Presented with generalized fatigue and blood in urine for past few days. Continues to have abdominal swelling. Endorses abdominal discomfort.   Patient has history of long-standing ascites and pedal edema  secondary to noncompliance with Spironolactone and Lasix. He was seen in January 11 2017 he began was found to have distended bladder which was probably causing pain and was found to have urinary tract infection foleycatheter was placed he was discharged on antibiotics but never filled the prescription. Patient continues to drink daily.He was brought by EMS to ER secondary to abdominal pain on September 3 but left AMA. History of urinary retention status post Foley catheter placement by [facility]. At some point his Foley catheter was discontinued he presented back in June 2018 with urinary retention TIPS done in 2017 Prior admissions complicated by DTs.  Patient is now admitted to the hospitalist service with fatigue hematuria sepsis urinary tract infection EtOH cirrhosis.  Clinical Assessment and Goals of Care:  A palliative consultation has been requested for goals of care discussions.  The patient is resting in bed. He is awake and alert. He complains of lower abdominal discomfort pain in his back pain in his shoulders and sides. He denies any  shortness of breath. He appears weak and deconditioned. Unfortunately, he states that he drinks beer on a daily basis. I introduced scope of palliative services. We discussed about appropriate symptom management. Discussed about goals wishes and values. Discussed with patient that his serum albumin is markedly low at 1.3 g/dL. He remains at high risk for ongoing decline and death.  See discussions below. Thank you for the consult.  NEXT OF KIN  daughter Crystal Pressley   SUMMARY OF RECOMMENDATIONS    Agree with DNR DNI Oxy IR for pain Agree with CIWA protocol, avoid DTs.  Goals of care: patient states that he knows he has to stop drinking, he does not want to die, he does not want to go back home with his brother,he is agreeable to SNF rehab with palliative following, he understands he is getting weaker and more seriously ill because of his ETOH use and medication non compliance in the outpatient setting.   Call placed but unable to reach daughter, next of kin Crystal Pressley [phone] for additional discussions.  Continue to follow disease trajectory to help guide additional decision making.   Code Status/Advance Care Planning:  DNR    Symptom Management:    continue current mode of care.   Palliative Prophylaxis:   Delirium  Protocol   Psycho-social/Spiritual:   Desire for further Chaplaincy support:yes  Additional Recommendations: Education on Hospice  Prognosis:   Unable to determine  Discharge Planning: Skilled Nursing Facility for rehab with Palliative care service follow-up      Primary Diagnoses: Present on Admission: . Alcohol abuse . Alcoholic cirrhosis of liver with ascites (HCC) . GERD (gastroesophageal reflux disease) . Hyponatremia with excess extracellular fluid volume . Thrombocytopenia (HCC) . Protein-calorie malnutrition (HCC) . Acute lower UTI . Chronic liver disease and cirrhosis (HCC) . Hypocalcemia . Sepsis (HCC)   I have reviewed  the medical record, interviewed the patient and family, and examined the patient. The following aspects are pertinent.  Past Medical History:  Diagnosis Date  . Cirrhosis of liver (HCC)   . Constipation   . COPD (chronic obstructive pulmonary disease) (HCC)   . Depression   . Dyspnea   . ETOH abuse   . GERD (gastroesophageal reflux disease)   . Hypertension    Social History   Social History  . Marital status: Single    Spouse name: N/A  . Number of children: N/A  . Years of education: N/A   Social History Main Topics  . Smoking status: Current Every Day Smoker    Packs/day: 0.50    Years: 0.50    Types: Cigarettes  . Smokeless tobacco: Never Used     Comment: 6-8 cigs daily  . Alcohol use No     Comment: " a couple swigs of beer the other day"  . Drug use: No  . Sexual activity: No   Other Topics Concern  . None   Social History Narrative  . None   Family History  Problem Relation Age of Onset  . Diabetes Mother    Scheduled Meds: . citalopram  20 mg Oral Daily  . famotidine  10 mg Oral Daily  . feeding supplement (ENSURE ENLIVE)  237 mL Oral TID BM  . folic acid  1 mg Intravenous Daily  . lactulose  20 g Oral TID  . mouth rinse  15 mL Mouth Rinse BID  . potassium chloride  40 mEq Oral Q4H  . thiamine  100 mg Intravenous Daily   Continuous Infusions: . sodium chloride 75 mL/hr at 02/03/17 0613  . magnesium sulfate 1 - 4 g bolus IVPB 2 g (02/03/17 1159)  . piperacillin-tazobactam (ZOSYN)  IV Stopped (02/03/17 0839)  . vancomycin Stopped (02/03/17 0902)   PRN Meds:.albuterol, LORazepam, ondansetron **OR** ondansetron (ZOFRAN) IV, oxyCODONE Medications Prior to Admission:  Prior to Admission medications   Medication Sig Start Date End Date Taking? Authorizing Provider  albuterol (PROVENTIL HFA;VENTOLIN HFA) 108 (90 Base) MCG/ACT inhaler Inhale 2 puffs into the lungs every 6 (six) hours as needed for wheezing or shortness of breath. 07/12/16   [name],  MD  cefpodoxime (VANTIN) 200 MG tablet Take 1 tablet (200 mg total) by mouth 2 (two) times daily. 11/12/16   Leroy Sea, MD  cephALEXin (KEFLEX) 500 MG capsule Take 1 capsule (500 mg total) by mouth 3 (three) times daily. 01/11/17   Raeford Razor, MD  chlordiazePOXIDE (LIBRIUM) 5 MG capsule Please dispense 18 pills - Take 1 pill three times a day for 3 days, then Take 1 pill two times a day for 3 days, then Take 1 pill once a day for 3 days and stop. 11/12/16   Leroy Sea, MD  citalopram (CELEXA) 20 MG tablet Take 1 tablet (20 mg total) by mouth daily. 11/15/16  Jaclyn ShaggyAmao, [name], MD  diclofenac sodium (VOLTAREN) 1 % GEL Apply 4 g topically 4 (four) times daily as needed (for pain). 04/01/16   Ghimire, Werner LeanShanker M, MD  feeding supplement, ENSURE ENLIVE, (ENSURE ENLIVE) LIQD Take 237 mLs by mouth 3 (three) times daily between meals. 04/01/16   Ghimire, Werner LeanShanker M, MD  folic acid (FOLVITE) 1 MG tablet Take 1 tablet (1 mg total) by mouth daily. 11/15/16   Jaclyn ShaggyAmao, [name], MD  furosemide (LASIX) 20 MG tablet Take 1 tablet (20 mg total) by mouth daily. 11/15/16   Jaclyn ShaggyAmao, [name], MD  lactulose (CHRONULAC) 10 GM/15ML solution Take 30 mLs (20 g total) by mouth 3 (three) times daily. Patient taking differently: Take 10 g by mouth 2 (two) times daily.  07/12/16   Jaclyn ShaggyAmao, [name], MD  ondansetron (ZOFRAN) 4 MG tablet Take 1 tablet (4 mg total) by mouth every 8 (eight) hours as needed for nausea or vomiting. 04/18/16   Jaclyn ShaggyAmao, [name], MD  potassium chloride SA (K-DUR,KLOR-CON) 20 MEQ tablet Take 1 tablet (20 mEq total) by mouth daily. 07/12/16   Jaclyn ShaggyAmao, [name], MD  ranitidine (ZANTAC) 150 MG tablet Take 1 tablet (150 mg total) by mouth 2 (two) times daily. 11/15/16   Jaclyn ShaggyAmao, [name], MD  spironolactone (ALDACTONE) 50 MG tablet Take 1 tablet (50 mg total) by mouth daily. 11/15/16   Jaclyn ShaggyAmao, [name], MD  tamsulosin (FLOMAX) 0.4 MG CAPS capsule Take 1 capsule (0.4 mg total) by mouth daily. 11/15/16   Jaclyn ShaggyAmao, [name], MD  tamsulosin  (FLOMAX) 0.4 MG CAPS capsule Take 1 capsule (0.4 mg total) by mouth daily. 01/11/17   Raeford RazorKohut, Stephen, MD  thiamine 100 MG tablet Take 1 tablet (100 mg total) by mouth daily. 11/15/16   Jaclyn ShaggyAmao, [name], MD  VOLTAREN 1 % GEL APPLY 4 GRAMS TOPICALLY 4 TIMES DAILY. 04/05/16   Jaclyn ShaggyAmao, [name], MD   No Known Allergies Review of Systems +pain in lower abdomen +pain in back and shoulders  Physical Exam Appears weak, disheveled.  S1 S2 Diminished breath sounds towards bases Has trace edema Awake alert Answers all questions well No tremors noted  Vital Signs: BP (!) 108/41   Pulse 100   Temp 98.1 F (36.7 C) (Oral)   Resp (!) 24   Ht 6\' 1"  (1.854 m)   Wt 78.8 kg (173 lb 11.6 oz)   SpO2 95%   BMI 22.92 kg/m  Pain Assessment: 0-10   Pain Score: 9    SpO2: SpO2: 95 % O2 Device:SpO2: 95 % O2 Flow Rate: .O2 Flow Rate (L/min): 2 L/min  IO: Intake/output summary:  Intake/Output Summary (Last 24 hours) at 02/03/17 1240 Last data filed at 02/03/17 0645  Gross per 24 hour  Intake             8700 ml  Output              426 ml  Net             8274 ml    LBM: Last BM Date: 02/03/17 Baseline Weight: Weight: 74.8 kg (165 lb) Most recent weight: Weight: 78.8 kg (173 lb 11.6 oz)     Palliative Assessment/Data:   Flowsheet Rows     Most Recent Value  Intake Tab  Referral Department  Hospitalist  Unit at Time of Referral  Intermediate Care Unit  Palliative Care Primary Diagnosis  Sepsis/Infectious Disease [ETOH sepsis UTI weakness ]  Palliative Care Type  New Palliative care  Reason for referral  Clarify Goals of Care, Pain  Date first seen by Palliative Care  02/03/17  Clinical Assessment  Palliative Performance Scale Score  30%  Pain Max last 24 hours  6  Pain Min Last 24 hours  4  Dyspnea Max Last 24 Hours  3  Dyspnea Min Last 24 hours  2  Nausea Max Last 24 Hours  3  Nausea Min Last 24 Hours  2  Anxiety Max Last 24 Hours  4  Anxiety Min Last 24 Hours  3  Psychosocial &  Spiritual Assessment  Palliative Care Outcomes  Patient/Family meeting held?  Yes  Who was at the meeting?  patient who is awake alert   Palliative Care Outcomes  Clarified goals of care     PPS 30%  Time In:  10 Time Out:  11.10 Time Total:  70 min  Greater than 50%  of this time was spent counseling and coordinating care related to the above assessment and plan.  Signed by: Rosalin Hawking, MD  [phone]  Please contact Palliative Medicine Team phone at [phone] for questions and concerns.  For individual provider: See Loretha Stapler

## 2017-02-03 NOTE — Progress Notes (Signed)
[facility]  PROGRESS NOTE  [name] ZOX:[id number] DOB: [birth year] DOA: 02/02/2017 PCP: [name], MD   Brief HPI:    56 y.o. male with medical history significant of alcoholic liver cirrhosis (s/p TIPS in 02/2016), protein calorie malnutrition, GERD, depression, BPH and urinary retention  , COPD,  medical noncompliance, tobacco abuse  Presented with generalized fatigue and blood in urine for past few days. Continues to have abdominal swelling. Endorses abdominal discomfort. Endorses suprapubic pain and tenderness diarrhea nausea and some vomiting which has been nonbloody. He continues to have dysuria. Denies any fever at home.  Patient has history of long-standing ascites and pedal edema  secondary to noncompliance with Spironolactone and Lasix. He was seen in January 11 2017 he began was found to have distended bladder which was probably causing pain and was found to have urinary tract infection foley catheter was placed he was discharged on antibiotics but never filled the prescription. Patient continues to drink daily.He was brought by EMS to ER secondary to abdominal pain on September 3 but left AMA. : History of urinary retention status post Foley catheter placement by [facility]. At some point his Foley catheter was discontinued he presented back in June 2018 with urinary retention TIPS done in 2017 Prior admissions complicated by DTs.   Subjective   Patient seen and examined, complained of back pain.   Assessment/Plan:     1. Sepsis due to UTI- improved, patient started on vancomycin and Zosyn. Blood cultures 2 is negative to date.urine culture was not obtained at the time of admission. Will obtain urine culture today.Lactic acid improved to 2.6. 2. Alcohol abuse-patient started on CIWA protocol, social consulted. 3. Alcoholic cirrhosis of liver with ascites-patient has acute decompensation in setting of ongoing alcohol abuse. Poor prognosis. MELD score  32. 4. Hyponatremia- patient has chronic hyponatremia, his baseline close to 121, likely from chronic liver disease, dehydration.this morning sodium is 123. 5. Hypokalemia-potassium is 3.2, will replace potassium and check BMP in a.m. 6. Thrombocytopenia- secondary to alcoholic cirrhosis of liver,platelet count is 71 today. 7. Hypocalcemia- calcium 6.7, corrected calcium for albumin of 1.3 is 8.9 8. Hypomagnesemia- magnesium is 1.7, will give  give 2 g of   Iv mag sulfate    DVT prophylaxis: SCDs  Code Status: DO NOT RESUSCITATE/DO NOT INTUBATE  Family Communication: no family present at bedside   Disposition Plan: to be determined   Consultants:  none  Procedures:  none  Continuous infusions . sodium chloride 75 mL/hr at 02/03/17 0613  . piperacillin-tazobactam (ZOSYN)  IV Stopped (02/03/17 0839)  . vancomycin Stopped (02/03/17 0902)      Antibiotics:   Anti-infectives    Start     Dose/Rate Route Frequency Ordered Stop   02/03/17 0400  piperacillin-tazobactam (ZOSYN) IVPB 3.375 g     3.375 g 12.5 mL/hr over 240 Minutes Intravenous Every 8 hours 02/02/17 2254     02/02/17 2359  vancomycin (VANCOCIN) IVPB 750 mg/150 ml premix     750 mg 150 mL/hr over 60 Minutes Intravenous Every 8 hours 02/02/17 2254     02/02/17 1915  cefTRIAXone (ROCEPHIN) 2 g in dextrose 5 % 50 mL IVPB  Status:  Discontinued     2 g 100 mL/hr over 30 Minutes Intravenous Every 24 hours 02/02/17 1905 02/02/17 2248       Objective   Vitals:   02/03/17 0600 02/03/17 0630 02/03/17 0751 02/03/17 0800  BP: (!) 107/45   (!) 108/41  Pulse: 95 Marland Kitchen)  103  100  Resp: 19 (!) 24    Temp:   98.1 F (36.7 C)   TempSrc:   Oral   SpO2: 90% 95%    Weight:      Height:        Intake/Output Summary (Last 24 hours) at 02/03/17 1104 Last data filed at 02/03/17 0645  Gross per 24 hour  Intake             8700 ml  Output              426 ml  Net             8274 ml   Filed Weights   02/02/17 2131  02/03/17 0040  Weight: 74.8 kg (165 lb) 78.8 kg (173 lb 11.6 oz)     Physical Examination:  Physical Exam: Eyes: No icterus, extraocular muscles intact  Mouth: Oral mucosa is moist Neck: Supple, no deformities, masses, or tenderness Lungs: Normal respiratory effort, bilateral clear to auscultation, no crackles or wheezes.  Heart: Regular rate and rhythm, S1 and S2 normal, no murmurs, rubs auscultated Abdomen: BS normoactive,soft,distended,non-tender to palpation,no organomegaly Extremities: No pretibial edema, no erythema, no cyanosis, no clubbing Neuro : Alert and oriented to time, place and person, No focal deficits     Data Reviewed: I have personally reviewed following labs and imaging studies  CBG: No results for input(s): GLUCAP in the last 168 hours.  CBC:  Recent Labs Lab 01/29/17 1155 02/02/17 1839 02/03/17 0827  WBC 13.9* 14.1* 12.2*  NEUTROABS  --  11.1*  --   HGB 11.5* 10.1* 8.8*  HCT 32.1* 27.2* 23.7*  MCV 101.6* 99.6 102.6*  PLT 86* 77* 71*    Basic Metabolic Panel:  Recent Labs Lab 01/29/17 1155 02/02/17 1839 02/02/17 2355 02/03/17 0827  NA 121* 118* 124* 123*  K 4.1 3.5 3.4* 3.2*  CL 92* 89* 97* 97*  CO2 22 18* 18* 18*  GLUCOSE 104* 83 70 67  BUN 7 6 5* <5*  CREATININE 0.43* 0.49* 0.41* 0.42*  CALCIUM 7.4* 7.3* 6.7* 6.7*  MG  --  1.7  --  1.7  PHOS  --  3.0  --  2.5    Recent Results (from the past 240 hour(s))  Blood Culture (routine x 2)     Status: None (Preliminary result)   Collection Time: 02/02/17  6:03 PM  Result Value Ref Range Status   Specimen Description BLOOD RIGHT FOREARM  Final   Special Requests   Final    BOTTLES DRAWN AEROBIC AND ANAEROBIC Blood Culture adequate volume   Culture   Final    NO GROWTH < 12 HOURS Performed at [facility], [address] [place], [place] [address]    Report Status PENDING  Incomplete  Blood Culture (routine x 2)     Status: None (Preliminary result)   Collection Time:  02/02/17  6:08 PM  Result Value Ref Range Status   Specimen Description BLOOD LEFT HAND  Final   Special Requests   Final    BOTTLES DRAWN AEROBIC AND ANAEROBIC Blood Culture adequate volume   Culture   Final    NO GROWTH < 12 HOURS Performed at [facility], [address] [place], [place] [address]    Report Status PENDING  Incomplete  MRSA PCR Screening     Status: None   Collection Time: 02/02/17 11:49 PM  Result Value Ref Range Status   MRSA by PCR  NEGATIVE NEGATIVE Final    Comment:        The GeneXpert MRSA Assay (FDA approved for NASAL specimens only), is one component of a comprehensive MRSA colonization surveillance program. It is not intended to diagnose MRSA infection nor to guide or monitor treatment for MRSA infections.      Liver Function Tests:  Recent Labs Lab 02/02/17 1839 02/03/17 0827  AST 152* 130*  ALT 50 42  ALKPHOS 123 95  BILITOT 17.8* 15.8*  PROT 6.3* 5.4*  ALBUMIN 1.4* 1.3*    Recent Labs Lab 02/02/17 1839 02/03/17 0827  LIPASE 65* 48    Recent Labs Lab 02/02/17 2126  AMMONIA 85*    Cardiac Enzymes:  Recent Labs Lab 02/02/17 2235 02/03/17 0320 02/03/17 0827  TROPONINI <0.03 <0.03 <0.03   BNP (last 3 results)  Recent Labs  11/11/16 1254  BNP 454.7*    ProBNP (last 3 results) No results for input(s): PROBNP in the last 8760 hours.    Studies: Dg Chest 2 View  Result Date: 02/02/2017 CLINICAL DATA:  Generalized weakness.  Hematuria.  Malaise. EXAM: CHEST  2 VIEW COMPARISON:  01/11/2017. FINDINGS: Interval borderline enlargement of the cardiac silhouette. Clear lungs. Unremarkable bones. Mild diffuse peribronchial thickening without significant change. IMPRESSION: No acute abnormality.  Stable mild chronic bronchitic changes. Electronically Signed   By: Beckie Salts M.D.   On: 02/02/2017 19:26   US Abdomen Complete  Result Date: 02/02/2017 CLINICAL DATA:  Ascites.  Cirrhosis.  Hydronephrosis. EXAM: ABDOMEN  ULTRASOUND COMPLETE COMPARISON:  CT 01/11/2017 FINDINGS: Gallbladder: Gallbladder is distended with sludge and multiple shadowing stones. Mild gallbladder wall thickening measuring 5 mm. Negative sonographic Murphy sign. Common bile duct: Diameter: 10 mm, unchanged from prior CT Liver: No focal lesion identified. Diffusely increased and heterogeneous in parenchymal echogenicity. Portal vein is patent on color Doppler imaging with normal direction of blood flow towards the liver. TIPS not interrogated but visualized at the portal vein. IVC: Not well visualized. Pancreas: Not well visualized. Spleen: Size and appearance within normal limits. Right Kidney: Length: 11.3 cm. Echogenicity within normal limits. No mass or hydronephrosis visualized. Left Kidney: Length: 12.5 cm. Echogenicity within normal limits. No mass or hydronephrosis visualized. Abdominal aorta: No aneurysm visualized. Majority of the aorta is not well seen. Other findings: Small volume of perihepatic ascites. IMPRESSION: 1. Echogenic liver consistent with hepatic steatosis or other chronic liver disease. No discrete focal lesion is seen sonographically. 2. Distended gallbladder sludge and multiple shadowing stones. Gallbladder wall thickening, likely secondary to chronic liver disease. There is stable biliary dilatation from prior CT. No sonographic Murphy sign. 3. Small volume intra-abdominal ascites. 4. No hydronephrosis. 5. Midline structures including the abdominal aorta, IVC, and pancreas are obscured. Electronically Signed   By: Rubye Oaks M.D.   On: 02/02/2017 23:25    Scheduled Meds: . citalopram  20 mg Oral Daily  . famotidine  10 mg Oral Daily  . feeding supplement (ENSURE ENLIVE)  237 mL Oral TID BM  . folic acid  1 mg Intravenous Daily  . lactulose  20 g Oral TID  . mouth rinse  15 mL Mouth Rinse BID  . thiamine  100 mg Intravenous Daily      Time spent: 25 min  [facility]   [facility] Pager [phone]. If  7PM-7AM, please contact night-coverage at [email or web address], Office  [phone]  password [email or web address]  02/03/2017, 11:04 AM  LOS: 1 day

## 2017-02-03 NOTE — Progress Notes (Addendum)
CRITICAL VALUE ALERT  Critical Value:  Lactic acid 2.6  Date & Time Notied:  02-03-17 0320  Provider Notified: Burnadette PeterLynch, NP  Orders Received/Actions taken: New order for NS @ 75 ml/hr

## 2017-02-04 DIAGNOSIS — J441 Chronic obstructive pulmonary disease with (acute) exacerbation: Secondary | ICD-10-CM

## 2017-02-04 LAB — COMPREHENSIVE METABOLIC PANEL
ALT: 46 U/L (ref 17–63)
ANION GAP: 8 (ref 5–15)
AST: 135 U/L — ABNORMAL HIGH (ref 15–41)
Albumin: 1.4 g/dL — ABNORMAL LOW (ref 3.5–5.0)
Alkaline Phosphatase: 104 U/L (ref 38–126)
BUN: <5 mg/dL — ABNORMAL LOW (ref 6–20)
CHLORIDE: 101 mmol/L (ref 101–111)
CO2: 19 mmol/L — AB (ref 22–32)
CREATININE: 0.35 mg/dL — AB (ref 0.61–1.24)
Calcium: 7.4 mg/dL — ABNORMAL LOW (ref 8.9–10.3)
Glucose, Bld: 75 mg/dL (ref 65–99)
POTASSIUM: 3.6 mmol/L (ref 3.5–5.1)
SODIUM: 128 mmol/L — AB (ref 135–145)
Total Bilirubin: 16.5 mg/dL — ABNORMAL HIGH (ref 0.3–1.2)
Total Protein: 5.7 g/dL — ABNORMAL LOW (ref 6.5–8.1)

## 2017-02-04 LAB — CALCIUM, IONIZED: CALCIUM, IONIZED, SERUM: 3.9 mg/dL — AB (ref 4.5–5.6)

## 2017-02-04 MED ORDER — IPRATROPIUM-ALBUTEROL 0.5-2.5 (3) MG/3ML IN SOLN: 3.0000 mL | Freq: Four times a day (QID) | RESPIRATORY_TRACT | Status: DC

## 2017-02-04 MED ORDER — IPRATROPIUM-ALBUTEROL 0.5-2.5 (3) MG/3ML IN SOLN
3.0000 mL | Freq: Four times a day (QID) | RESPIRATORY_TRACT | Status: DC
  Administered 2017-02-04: 3 mL via RESPIRATORY_TRACT

## 2017-02-04 MED ORDER — IPRATROPIUM-ALBUTEROL 0.5-2.5 (3) MG/3ML IN SOLN
3.0000 mL | Freq: Four times a day (QID) | RESPIRATORY_TRACT | Status: DC
  Administered 2017-02-04: 3 mL via RESPIRATORY_TRACT
  Filled 2017-02-04: qty 3

## 2017-02-04 MED ORDER — IPRATROPIUM-ALBUTEROL 0.5-2.5 (3) MG/3ML IN SOLN
3.0000 mL | Freq: Three times a day (TID) | RESPIRATORY_TRACT | Status: DC
  Filled 2017-02-04: qty 3

## 2017-02-04 MED ORDER — METHYLPREDNISOLONE SODIUM SUCC 125 MG IJ SOLR
60.0000 mg | Freq: Four times a day (QID) | INTRAMUSCULAR | Status: DC
  Administered 2017-02-04 – 2017-02-06 (×9): 60 mg via INTRAVENOUS
  Filled 2017-02-04 (×9): qty 2

## 2017-02-04 MED ORDER — BOOST / RESOURCE BREEZE PO LIQD
1.0000 | Freq: Three times a day (TID) | ORAL | Status: DC
  Administered 2017-02-04 – 2017-02-05 (×2): 1 via ORAL

## 2017-02-04 MED ORDER — DEXTROSE 5 % IV SOLN
1.0000 g | INTRAVENOUS | Status: DC
  Administered 2017-02-04 – 2017-02-05 (×2): 1 g via INTRAVENOUS
  Filled 2017-02-04 (×3): qty 10

## 2017-02-04 NOTE — Progress Notes (Signed)
Pt refused neb tx. No distress noted at this time.

## 2017-02-04 NOTE — Evaluation (Signed)
Physical Therapy Evaluation Patient Details Name: [name] MRN: [id number] DOB: 05/24/1961 Today's Date: 02/04/2017   History of Present Illness  56 yo male admitted with sepsis, fatigue, hematuria. Hx of ETOH abuse, liver cirrhosis, COPD, noncompliance.   Clinical Impression  On eval, pt required Min assist for mobility. He performed a stand pivot x 2 with a RW. He is experiencing frequent loose stools. Total assist required for toilet hygiene. Pt presents with generalized weakness, decreased activity tolerance, and impaired gait and balance. O2 sat >90% on RA at rest, 85% on RA with minimal activity. Recommend SNF. Will follow during hospital stay.     Follow Up Recommendations SNF    Equipment Recommendations  None recommended by PT    Recommendations for Other Services       Precautions / Restrictions Precautions Precautions: Fall Precaution Comments: monitor O2 sats Restrictions Weight Bearing Restrictions: No      Mobility  Bed Mobility Overal bed mobility: Needs Assistance Bed Mobility: Supine to Sit;Sit to Supine     Supine to sit: Min assist;HOB elevated Sit to supine: Min assist;HOB elevated   General bed mobility comments: Increased time. Assist for trunk and LEs.   Transfers Overall transfer level: Needs assistance Equipment used: Rolling walker (2 wheeled) Transfers: Sit to/from UGI CorporationStand;Stand Pivot Transfers Sit to Stand: Min assist;From elevated surface Stand pivot transfers: Min assist       General transfer comment: Assist to rise, stabilize, control descent. VCS safety, hand placement. Stand pivot x 2, bed<>recliner, with RW.   Ambulation/Gait             General Gait Details: Deferred due to weakness, diarrhea.   Stairs            Wheelchair Mobility    Modified Rankin (Stroke Patients Only)       Balance Overall balance assessment: Needs assistance         Standing balance support: Bilateral upper extremity  supported Standing balance-Leahy Scale: Poor                               Pertinent Vitals/Pain Pain Assessment: Faces Faces Pain Scale: Hurts little more Pain Location: back, abdomen Pain Descriptors / Indicators: Sore;Aching Pain Intervention(s): Limited activity within patient's tolerance;Repositioned    Home Living Family/patient expects to be discharged to:: Unsure Living Arrangements: Other relatives (brother)                    Prior Function Level of Independence: Independent with assistive device(s)         Comments: uses RW PRN     Hand Dominance   Dominant Hand: Right    Extremity/Trunk Assessment   Upper Extremity Assessment Upper Extremity Assessment: Generalized weakness    Lower Extremity Assessment Lower Extremity Assessment: Generalized weakness    Cervical / Trunk Assessment Cervical / Trunk Assessment: Kyphotic  Communication   Communication: HOH  Cognition Arousal/Alertness: Awake/alert Behavior During Therapy: WFL for tasks assessed/performed Overall Cognitive Status: Within Functional Limits for tasks assessed                                        General Comments      Exercises     Assessment/Plan    PT Assessment Patient needs continued PT services  PT Problem List Decreased strength;Decreased mobility;Decreased activity tolerance;Decreased  balance;Decreased knowledge of use of DME       PT Treatment Interventions DME instruction;Gait training;Therapeutic activities;Therapeutic exercise;Patient/family education;Balance training;Functional mobility training    PT Goals (Current goals can be found in the Care Plan section)  Acute Rehab PT Goals Patient Stated Goal: to feel better PT Goal Formulation: With patient Time For Goal Achievement: 02/18/17 Potential to Achieve Goals: Good    Frequency Min 3X/week   Barriers to discharge        Co-evaluation               AM-PAC PT  "6 Clicks" Daily Activity  Outcome Measure Difficulty turning over in bed (including adjusting bedclothes, sheets and blankets)?: Unable Difficulty moving from lying on back to sitting on the side of the bed? : Unable Difficulty sitting down on and standing up from a chair with arms (e.g., wheelchair, bedside commode, etc,.)?: A Little Help needed moving to and from a bed to chair (including a wheelchair)?: A Little Help needed walking in hospital room?: A Little Help needed climbing 3-5 steps with a railing? : A Lot 6 Click Score: 13    End of Session Equipment Utilized During Treatment: Gait belt Activity Tolerance: Patient limited by fatigue Patient left: in bed;with call bell/phone within reach;with bed alarm set   PT Visit Diagnosis: Muscle weakness (generalized) (M62.81);Difficulty in walking, not elsewhere classified (R26.2)    Time: 1210-1228 PT Time Calculation (min) (ACUTE ONLY): 18 min   Charges:   PT Evaluation $PT Eval Low Complexity: 1 Low     PT G Codes:          Rebeca Alert, MPT Pager: [phone]

## 2017-02-04 NOTE — Progress Notes (Signed)
Pharmacy Antibiotic Note  Bryce MawRobert [name] is a 56 y.o. male with generalized fatigue and persistent hematuria admitted on 02/02/2017 with sepsis.  Pharmacy has been consulted for Rocephin for UTI. Afebrile, Cr WNL, blood cx ng x 2 days.   Plan: Rocephin 1 gm IV q24 Pharmacy to sign off  Height: 6\' 1"  (185.4 cm) Weight: 173 lb 11.6 oz (78.8 kg) IBW/kg (Calculated) : 79.9  Temp (24hrs), Avg:97.8 F (36.6 C), Min:97.4 F (36.3 C), Max:98.6 F (37 C)   Recent Labs Lab 01/29/17 1155 02/02/17 1839 02/02/17 1850 02/02/17 2134 02/02/17 2355 02/03/17 0320 02/03/17 0827 02/03/17 1436 02/03/17 2005 02/04/17 0333  WBC 13.9* 14.1*  --   --   --   --  12.2*  --   --   --   CREATININE 0.43* 0.49*  --   --  0.41*  --  0.42* 0.40* 0.36* 0.35*  LATICACIDVEN  --   --  4.33* 3.25* 3.2* 2.6*  --   --   --   --     Estimated Creatinine Clearance: 116.3 mL/min (A) (by C-G formula based on SCr of 0.35 mg/dL (L)).    No Known Allergies Antimicrobials this admission: 9/7 rocephin >> x1 ED 9/8 zosyn >> 9/9 9/8 vancomycin >>9/9 9/9 CTX >>  Dose adjustments this admission:  Microbiology results: 9/7 MRSA PCR neg 9/7 BCx2>>ngtd 9/7 Hepatitis panel>> ip 9/9 Ucx>> ip - collected after V/Z   Thank you for allowing pharmacy to be a part of this patient's care.  Bryce AbrahamMichelle T. [name], Pharm.D. [phone] 02/04/2017 10:54 AM

## 2017-02-04 NOTE — Progress Notes (Signed)
Initial Nutrition Assessment  DOCUMENTATION CODES:   Severe malnutrition in context of chronic illness  INTERVENTION:   -Provide Boost Breeze po TID, each supplement provides 250 kcal and 9 grams of protein -Once diet advanced, continue Ensure Enlive po TID, each supplement provides 350 kcal and 20 grams of protein  -RD will continue to monitor for needs  NUTRITION DIAGNOSIS:   Malnutrition (severe)related to chronic illness (ETOH cirrhosis with ascites) as evidenced by severe depletion of body fat, severe depletion of muscle mass.  GOAL:   Patient will meet greater than or equal to 90% of their needs  MONITOR:   PO intake, Supplement acceptance, Labs, Weight trends, I & O's  REASON FOR ASSESSMENT:   Consult Assessment of nutrition requirement/status  ASSESSMENT:   56 y.o. male with medical history significant of alcoholic liver cirrhosis (s/p TIPS in 02/2016), protein calorie malnutrition, GERD, depression, BPH and urinary retention  , COPD,  medical noncompliance, tobacco abuse  Patient continues with ETOH abuse. Pt with questionable intake PTA d/t daily ETOH consumption. Currently on clear liquid diet. Have ordered Boost Breeze supplements until patient can have ensure supplements.  Per chart review, pt's weight fluctuates d/t fluid accumulation. Pt with ascites. Nutrition-Focused physical exam completed. Findings are severe fat depletion, severe muscle depletion, and mild-severe edema.   Medications: IV Folic acid daily, Lactulose solution TID, IV Thiamine daily Labs reviewed: Low Na Mg/Phos WNL  Diet Order:  Diet clear liquid Room service appropriate? Yes; Fluid consistency: Thin  Skin:  Reviewed, no issues  Last BM:  9/8  Height:   Ht Readings from Last 1 Encounters:  02/03/17 6\' 1"  (1.854 m)    Weight:   Wt Readings from Last 1 Encounters:  02/03/17 173 lb 11.6 oz (78.8 kg)    Ideal Body Weight:  83.6 kg  BMI:  Body mass index is 22.92  kg/m.  Estimated Nutritional Needs:   Kcal:  2200-2400  Protein:  110-120g  Fluid:  2L/day  EDUCATION NEEDS:   No education needs identified at this time  Tilda FrancoLindsey Alden Bensinger, MS, RD, LDN Pager: [phone] After Hours Pager: [phone]

## 2017-02-04 NOTE — Progress Notes (Signed)
[facility]  PROGRESS NOTE  [name] ZOX:[id number] DOB: [birth year] DOA: 02/02/2017 PCP: [name], MD   Brief HPI:    56 y.o. male with medical history significant of alcoholic liver cirrhosis (s/p TIPS in 02/2016), protein calorie malnutrition, GERD, depression, BPH and urinary retention  , COPD,  medical noncompliance, tobacco abuse  Presented with generalized fatigue and blood in urine for past few days. Continues to have abdominal swelling. Endorses abdominal discomfort. Endorses suprapubic pain and tenderness diarrhea nausea and some vomiting which has been nonbloody. He continues to have dysuria. Denies any fever at home.  Patient has history of long-standing ascites and pedal edema  secondary to noncompliance with Spironolactone and Lasix. He was seen in January 11 2017 he began was found to have distended bladder which was probably causing pain and was found to have urinary tract infection foley catheter was placed he was discharged on antibiotics but never filled the prescription. Patient continues to drink daily.He was brought by EMS to ER secondary to abdominal pain on September 3 but left AMA. : History of urinary retention status post Foley catheter placement by [facility]. At some point his Foley catheter was discontinued he presented back in June 2018 with urinary retention TIPS done in 2017 Prior admissions complicated by DTs.   Subjective   Patient seen and examined, wants to try liquid diet. Has been coughing up phlegm.   Assessment/Plan:     1. Sepsis due to UTI- improved, patient started on vancomycin and Zosyn. Blood cultures 2 is negative to date.urine culture was not obtained at the time of admission. Will obtain urine culture today.Lactic acid improved to 2.6. 2. Alcohol abuse-patient started on CIWA protocol, social  Work consulted. 3. Alcoholic cirrhosis of liver with ascites-patient has acute decompensation in setting of ongoing alcohol  abuse. Poor prognosis. MELD score 32. 4. COPD exacerbation- start Solu-Medrol 60 minute grams IV every 6 hours, DuoNeb nebulizers every 6 hours 5. Hyponatremia-improved, sodium is 128 ,patient has chronic hyponatremia, his baseline close to 121, likely from chronic liver disease, dehydration. 6. Hypokalemia-replete, potassium is 3.6 7. Thrombocytopenia- secondary to alcoholic cirrhosis of liver,platelet count is 71.  Follow CBC in a.m.  8. Hypocalcemia- calcium 6.7, corrected calcium for albumin of 1.3 is 8.9 9. Hypomagnesemia- magnesium is 1.7, was given  2 g of  iv mag sulfate    DVT prophylaxis: SCDs  Code Status: DO NOT RESUSCITATE/DO NOT INTUBATE  Family Communication: no family present at bedside   Disposition Plan: to be determined   Consultants:  none  Procedures:  none  Continuous infusions . sodium chloride 75 mL/hr at 02/04/17 0830  . cefTRIAXone (ROCEPHIN)  IV        Antibiotics:   Anti-infectives    Start     Dose/Rate Route Frequency Ordered Stop   02/04/17 1400  cefTRIAXone (ROCEPHIN) 1 g in dextrose 5 % 50 mL IVPB     1 g 100 mL/hr over 30 Minutes Intravenous Every 24 hours 02/04/17 1056     02/03/17 0400  piperacillin-tazobactam (ZOSYN) IVPB 3.375 g  Status:  Discontinued     3.375 g 12.5 mL/hr over 240 Minutes Intravenous Every 8 hours 02/02/17 2254 02/04/17 1043   02/02/17 2359  vancomycin (VANCOCIN) IVPB 750 mg/150 ml premix  Status:  Discontinued     750 mg 150 mL/hr over 60 Minutes Intravenous Every 8 hours 02/02/17 2254 02/04/17 1043   02/02/17 1915  cefTRIAXone (ROCEPHIN) 2 g in dextrose 5 % 50 mL  IVPB  Status:  Discontinued     2 g 100 mL/hr over 30 Minutes Intravenous Every 24 hours 02/02/17 1905 02/02/17 2248       Objective   Vitals:   02/04/17 0800 02/04/17 0908 02/04/17 1000 02/04/17 1200  BP: (!) 105/56  (!) 115/56 (!) 117/52  Pulse:   87 80  Resp: Temp: 97.7 F (36.5 C)     TempSrc: Oral     SpO2: 94% 97% 95%  93%  Weight:      Height:        Intake/Output Summary (Last 24 hours) at 02/04/17 1341 Last data filed at 02/04/17 1300  Gross per 24 hour  Intake          3093.75 ml  Output             1725 ml  Net          1368.75 ml   Filed Weights   02/02/17 2131 02/03/17 0040  Weight: 74.8 kg (165 lb) 78.8 kg (173 lb 11.6 oz)     Physical Examination:  Physical Exam: Eyes: No icterus, extraocular muscles intact  Mouth: Oral mucosa is moist, no lesions on palate,  Neck: Supple, no deformities, masses, or tenderness Lungs: Bilateral wheezing Heart: Regular rate and rhythm, S1 and S2 normal, no murmurs, rubs auscultated Abdomen: BS normoactive,soft,nondistended,non-tender to palpation,no organomegaly Extremities: No pretibial edema, no erythema, no cyanosis, no clubbing Neuro : Alert and oriented to time, place and person, No focal deficits   Data Reviewed: I have personally reviewed following labs and imaging studies  CBG: No results for input(s): GLUCAP in the last 168 hours.  CBC:  Recent Labs Lab 01/29/17 1155 02/02/17 1839 02/03/17 0827  WBC 13.9* 14.1* 12.2*  NEUTROABS  --  11.1*  --   HGB 11.5* 10.1* 8.8*  HCT 32.1* 27.2* 23.7*  MCV 101.6* 99.6 102.6*  PLT 86* 77* 71*    Basic Metabolic Panel:  Recent Labs Lab 02/02/17 1839 02/02/17 2355 02/03/17 0827 02/03/17 1436 02/03/17 2005 02/04/17 0333  NA 118* 124* 123* 125* 125* 128*  K 3.5 3.4* 3.2* 3.1* 3.7 3.6  CL 89* 97* 97* 99* 101 101  CO2 18* 18* 18* 18* 18* 19*  GLUCOSE 83 70 67 81 95 75  BUN 6 5* <5* 5* 5* <5*  CREATININE 0.49* 0.41* 0.42* 0.40* 0.36* 0.35*  CALCIUM 7.3* 6.7* 6.7* 7.2* 7.2* 7.4*  MG 1.7  --  1.7  --   --   --   PHOS 3.0  --  2.5  --   --   --     Recent Results (from the past 240 hour(s))  Blood Culture (routine x 2)     Status: None (Preliminary result)   Collection Time: 02/02/17  6:03 PM  Result Value Ref Range Status   Specimen Description BLOOD RIGHT FOREARM  Final    Special Requests   Final    BOTTLES DRAWN AEROBIC AND ANAEROBIC Blood Culture adequate volume   Culture   Final    NO GROWTH 2 DAYS Performed at [facility], [address] [place], [place] [address]    Report Status PENDING  Incomplete  Blood Culture (routine x 2)     Status: None (Preliminary result)   Collection Time: 02/02/17  6:08 PM  Result Value Ref Range Status   Specimen Description BLOOD LEFT HAND  Final   Special Requests   Final  BOTTLES DRAWN AEROBIC AND ANAEROBIC Blood Culture adequate volume   Culture   Final    NO GROWTH 2 DAYS Performed at [facility], [address], [place], [place] [address]    Report Status PENDING  Incomplete  MRSA PCR Screening     Status: None   Collection Time: 02/02/17 11:49 PM  Result Value Ref Range Status   MRSA by PCR NEGATIVE NEGATIVE Final    Comment:        The GeneXpert MRSA Assay (FDA approved for NASAL specimens only), is one component of a comprehensive MRSA colonization surveillance program. It is not intended to diagnose MRSA infection nor to guide or monitor treatment for MRSA infections.      Liver Function Tests:  Recent Labs Lab 02/02/17 1839 02/03/17 0827 02/04/17 0333  AST 152* 130* 135*  ALT 50 42 46  ALKPHOS 123 95 104  BILITOT 17.8* 15.8* 16.5*  PROT 6.3* 5.4* 5.7*  ALBUMIN 1.4* 1.3* 1.4*    Recent Labs Lab 02/02/17 1839 02/03/17 0827  LIPASE 65* 48    Recent Labs Lab 02/02/17 2126  AMMONIA 85*    Cardiac Enzymes:  Recent Labs Lab 02/02/17 2235 02/03/17 0320 02/03/17 0827  TROPONINI <0.03 <0.03 <0.03   BNP (last 3 results)  Recent Labs  11/11/16 1254  BNP 454.7*    ProBNP (last 3 results) No results for input(s): PROBNP in the last 8760 hours.    Studies: Dg Chest 2 View  Result Date: 02/02/2017 CLINICAL DATA:  Generalized weakness.  Hematuria.  Malaise. EXAM: CHEST  2 VIEW COMPARISON:  01/11/2017. FINDINGS: Interval borderline enlargement of the  cardiac silhouette. Clear lungs. Unremarkable bones. Mild diffuse peribronchial thickening without significant change. IMPRESSION: No acute abnormality.  Stable mild chronic bronchitic changes. Electronically Signed   By: Beckie Salts M.D.   On: 02/02/2017 19:26   US Abdomen Complete  Result Date: 02/02/2017 CLINICAL DATA:  Ascites.  Cirrhosis.  Hydronephrosis. EXAM: ABDOMEN ULTRASOUND COMPLETE COMPARISON:  CT 01/11/2017 FINDINGS: Gallbladder: Gallbladder is distended with sludge and multiple shadowing stones. Mild gallbladder wall thickening measuring 5 mm. Negative sonographic Murphy sign. Common bile duct: Diameter: 10 mm, unchanged from prior CT Liver: No focal lesion identified. Diffusely increased and heterogeneous in parenchymal echogenicity. Portal vein is patent on color Doppler imaging with normal direction of blood flow towards the liver. TIPS not interrogated but visualized at the portal vein. IVC: Not well visualized. Pancreas: Not well visualized. Spleen: Size and appearance within normal limits. Right Kidney: Length: 11.3 cm. Echogenicity within normal limits. No mass or hydronephrosis visualized. Left Kidney: Length: 12.5 cm. Echogenicity within normal limits. No mass or hydronephrosis visualized. Abdominal aorta: No aneurysm visualized. Majority of the aorta is not well seen. Other findings: Small volume of perihepatic ascites. IMPRESSION: 1. Echogenic liver consistent with hepatic steatosis or other chronic liver disease. No discrete focal lesion is seen sonographically. 2. Distended gallbladder sludge and multiple shadowing stones. Gallbladder wall thickening, likely secondary to chronic liver disease. There is stable biliary dilatation from prior CT. No sonographic Murphy sign. 3. Small volume intra-abdominal ascites. 4. No hydronephrosis. 5. Midline structures including the abdominal aorta, IVC, and pancreas are obscured. Electronically Signed   By: Rubye Oaks M.D.   On: 02/02/2017  23:25    Scheduled Meds: . citalopram  20 mg Oral Daily  . famotidine  10 mg Oral Daily  . feeding supplement  1 Container Oral TID BM  . feeding supplement (ENSURE ENLIVE)  237  mL Oral TID BM  . folic acid  1 mg Intravenous Daily  . ipratropium-albuterol  3 mL Nebulization TID  . lactulose  20 g Oral TID  . mouth rinse  15 mL Mouth Rinse BID  . methylPREDNISolone (SOLU-MEDROL) injection  60 mg Intravenous Q6H  . thiamine  100 mg Intravenous Daily      Time spent: 25 min  [facility] LLC[facility],Athaliah Baumbach S   [facility] Pager [phone]. If 7PM-7AM, please contact night-coverage at [email or web address], Office  [phone]  password [email or web address]  02/04/2017, 1:41 PM  LOS: 2 days

## 2017-02-05 LAB — COMPREHENSIVE METABOLIC PANEL
ALK PHOS: 92 U/L (ref 38–126)
ALT: 43 U/L (ref 17–63)
ANION GAP: 6 (ref 5–15)
AST: 97 U/L — ABNORMAL HIGH (ref 15–41)
Albumin: 1.3 g/dL — ABNORMAL LOW (ref 3.5–5.0)
BILIRUBIN TOTAL: 16.5 mg/dL — AB (ref 0.3–1.2)
BUN: 6 mg/dL (ref 6–20)
CALCIUM: 7.5 mg/dL — AB (ref 8.9–10.3)
CO2: 20 mmol/L — AB (ref 22–32)
Chloride: 100 mmol/L — ABNORMAL LOW (ref 101–111)
Creatinine, Ser: 0.59 mg/dL — ABNORMAL LOW (ref 0.61–1.24)
GFR calc non Af Amer: >60 mL/min (ref >60)
Glucose, Bld: 176 mg/dL — ABNORMAL HIGH (ref 65–99)
Potassium: 3.2 mmol/L — ABNORMAL LOW (ref 3.5–5.1)
SODIUM: 126 mmol/L — AB (ref 135–145)
TOTAL PROTEIN: 5.4 g/dL — AB (ref 6.5–8.1)

## 2017-02-05 LAB — URINE CULTURE: CULTURE: NO GROWTH

## 2017-02-05 LAB — CBC
HCT: 26.4 % — ABNORMAL LOW (ref 39.0–52.0)
HEMOGLOBIN: 9.5 g/dL — AB (ref 13.0–17.0)
MCH: 37 pg — AB (ref 26.0–34.0)
MCHC: 36 g/dL (ref 30.0–36.0)
MCV: 102.7 fL — ABNORMAL HIGH (ref 78.0–100.0)
Platelets: 76 10*3/uL — ABNORMAL LOW (ref 150–400)
RBC: 2.57 MIL/uL — ABNORMAL LOW (ref 4.22–5.81)
RDW: 17 % — ABNORMAL HIGH (ref 11.5–15.5)
WBC: 10.3 10*3/uL (ref 4.0–10.5)

## 2017-02-05 LAB — HEPATITIS PANEL, ACUTE
HCV AB: 0.2 {s_co_ratio} (ref 0.0–0.9)
HEP B C IGM: NEGATIVE
Hep A IgM: NEGATIVE
Hepatitis B Surface Ag: NEGATIVE

## 2017-02-05 MED ORDER — MAGNESIUM SULFATE 2 GM/50ML IV SOLN
2.0000 g | Freq: Once | INTRAVENOUS | Status: AC
  Administered 2017-02-05: 2 g via INTRAVENOUS
  Filled 2017-02-05: qty 50

## 2017-02-05 MED ORDER — POTASSIUM CHLORIDE 10 MEQ/50ML IV SOLN
10.0000 meq | INTRAVENOUS | Status: DC | PRN
  Administered 2017-02-05: 10 meq via INTRAVENOUS
  Filled 2017-02-05: qty 50

## 2017-02-05 MED ORDER — IPRATROPIUM-ALBUTEROL 0.5-2.5 (3) MG/3ML IN SOLN
3.0000 mL | Freq: Four times a day (QID) | RESPIRATORY_TRACT | Status: DC
  Administered 2017-02-05 – 2017-02-06 (×5): 3 mL via RESPIRATORY_TRACT
  Filled 2017-02-05 (×6): qty 3

## 2017-02-05 MED ORDER — METOCLOPRAMIDE HCL 5 MG/ML IJ SOLN: 5.0000 mg | Freq: Four times a day (QID) | INTRAMUSCULAR | Status: DC | PRN

## 2017-02-05 MED ORDER — VITAMIN B-1 100 MG PO TABS: 100.0000 mg | ORAL_TABLET | Freq: Every day | ORAL | Status: DC

## 2017-02-05 MED ORDER — POTASSIUM CHLORIDE 10 MEQ/100ML IV SOLN
10.0000 meq | INTRAVENOUS | Status: AC
  Administered 2017-02-05 (×3): 10 meq via INTRAVENOUS
  Filled 2017-02-05 (×3): qty 100

## 2017-02-05 MED ORDER — FOLIC ACID 1 MG PO TABS
1.0000 mg | ORAL_TABLET | Freq: Every day | ORAL | Status: DC
  Administered 2017-02-07 – 2017-02-08 (×2): 1 mg via ORAL
  Filled 2017-02-05 (×2): qty 1

## 2017-02-05 NOTE — Progress Notes (Addendum)
[facility]  PROGRESS NOTE  [name] ZOX:[id number] DOB: [birth year] DOA: 02/02/2017 PCP: [name], MD   Brief HPI:    56 y.o. male with medical history significant of alcoholic liver cirrhosis (s/p TIPS in 02/2016), protein calorie malnutrition, GERD, depression, BPH and urinary retention  , COPD,  medical noncompliance, tobacco abuse  Presented with generalized fatigue and blood in urine for past few days. Continues to have abdominal swelling. Endorses abdominal discomfort. Endorses suprapubic pain and tenderness diarrhea nausea and some vomiting which has been nonbloody. He continues to have dysuria. Denies any fever at home.  Patient has history of long-standing ascites and pedal edema  secondary to noncompliance with Spironolactone and Lasix. He was seen in January 11 2017 he began was found to have distended bladder which was probably causing pain and was found to have urinary tract infection foley catheter was placed he was discharged on antibiotics but never filled the prescription. Patient continues to drink daily.He was brought by EMS to ER secondary to abdominal pain on September 3 but left AMA. : History of urinary retention status post Foley catheter placement by [facility]. At some point his Foley catheter was discontinued he presented back in June 2018 with urinary retention TIPS done in 2017 Prior admissions complicated by DTs.   Subjective   Patient seen and examined, breathing is improved. Tolerating soft diet.   Assessment/Plan:     1. Sepsis due to UTI- improved, patient started on vancomycin and Zosyn. Blood cultures 2 is negative to date.He can Urine culture was not obtained at the time of admission.Urine culture obtained next day, shows no growth. Antibiotics have been changed to IV ceftriaxone.  2. Alcohol abuse-patient started on CIWA protocol, social  Work consulted. 3. Alcoholic cirrhosis of liver with ascites-patient has acute  decompensation in setting of ongoing alcohol abuse. Poor prognosis. MELD score 32. 4. COPD exacerbation-improved, continue  Solu-Medrol 60 minute grams IV every 6 hours, DuoNeb nebulizers every 6 hours 5. Hyponatremia-patient has chronic hyponatremia, his baseline close to 121, likely from chronic liver disease, dehydration. 6. Hypokalemia-replete, potassium is 3.2.will replace potassium with IV KCl 10 mg 3. Follow BMP in a.m.   7. Thrombocytopenia- secondary to alcoholic cirrhosis of liver,platelet count is 76.   8. Hypocalcemia- calcium 6.7, corrected calcium for albumin of 1.3 is 8.9 9. Hypomagnesemia- magnesium is 1.7, will give another dose of Methergine sulfate 2 g IV 1. Recheck magnesium level in a.m.    DVT prophylaxis: SCDs  Code Status: DO NOT RESUSCITATE/DO NOT INTUBATE  Family Communication: no family present at bedside   Disposition Plan: to be determined   Consultants:  none  Procedures:  none  Continuous infusions . sodium chloride 75 mL/hr at 02/04/17 0830  . cefTRIAXone (ROCEPHIN)  IV Stopped (02/04/17 1510)  . magnesium sulfate 1 - 4 g bolus IVPB    . potassium chloride    . potassium chloride Stopped (02/05/17 0745)      Antibiotics:   Anti-infectives    Start     Dose/Rate Route Frequency Ordered Stop   02/04/17 1400  cefTRIAXone (ROCEPHIN) 1 g in dextrose 5 % 50 mL IVPB     1 g 100 mL/hr over 30 Minutes Intravenous Every 24 hours 02/04/17 1056     02/03/17 0400  piperacillin-tazobactam (ZOSYN) IVPB 3.375 g  Status:  Discontinued     3.375 g 12.5 mL/hr over 240 Minutes Intravenous Every 8 hours 02/02/17 2254 02/04/17 1043   02/02/17 2359  vancomycin (VANCOCIN)  IVPB 750 mg/150 ml premix  Status:  Discontinued     750 mg 150 mL/hr over 60 Minutes Intravenous Every 8 hours 02/02/17 2254 02/04/17 1043   02/02/17 1915  cefTRIAXone (ROCEPHIN) 2 g in dextrose 5 % 50 mL IVPB  Status:  Discontinued     2 g 100 mL/hr over 30 Minutes Intravenous Every 24  hours 02/02/17 1905 02/02/17 2248       Objective   Vitals:   02/05/17 0606 02/05/17 0800 02/05/17 0812 02/05/17 1200  BP:  138/75    Pulse: 87 90    Resp: 14 18    Temp:  97.8 F (36.6 C)  98 F (36.7 C)  TempSrc:  Oral  Oral  SpO2: 93% 92% 94%   Weight:      Height:        Intake/Output Summary (Last 24 hours) at 02/05/17 1332 Last data filed at 02/05/17 0600  Gross per 24 hour  Intake             1805 ml  Output              675 ml  Net             1130 ml   Filed Weights   02/02/17 2131 02/03/17 0040  Weight: 74.8 kg (165 lb) 78.8 kg (173 lb 11.6 oz)     Physical Examination:  Physical Exam: Eyes: No icterus, extraocular muscles intact  Mouth: Oral mucosa is moist, no lesions on palate,  Neck: Supple, no deformities, masses, or tenderness Lungs: Normal respiratory effort, bilateral clear to auscultation, no crackles or wheezes.  Heart: Regular rate and rhythm, S1 and S2 normal, no murmurs, rubs auscultated Abdomen: BS normoactive,soft,nondistended,non-tender to palpation,no organomegaly Extremities: No pretibial edema, no erythema, no cyanosis, no clubbing Neuro : Alert and confused     Data Reviewed: I have personally reviewed following labs and imaging studies  CBG: No results for input(s): GLUCAP in the last 168 hours.  CBC:  Recent Labs Lab 02/02/17 1839 02/03/17 0827 02/05/17 0328  WBC 14.1* 12.2* 10.3  NEUTROABS 11.1*  --   --   HGB 10.1* 8.8* 9.5*  HCT 27.2* 23.7* 26.4*  MCV 99.6 102.6* 102.7*  PLT 77* 71* 76*    Basic Metabolic Panel:  Recent Labs Lab 02/02/17 1839  02/03/17 0827 02/03/17 1436 02/03/17 2005 02/04/17 0333 02/05/17 0328  NA 118*  < > 123* 125* 125* 128* 126*  K 3.5  < > 3.2* 3.1* 3.7 3.6 3.2*  CL 89*  < > 97* 99* 101 101 100*  CO2 18*  < > 18* 18* 18* 19* 20*  GLUCOSE 83  < > 67 81 95 75 176*  BUN 6  < > <5* 5* 5* <5* 6  CREATININE 0.49*  < > 0.42* 0.40* 0.36* 0.35* 0.59*  CALCIUM 7.3*  < > 6.7* 7.2* 7.2*  7.4* 7.5*  MG 1.7  --  1.7  --   --   --   --   PHOS 3.0  --  2.5  --   --   --   --   < > = values in this interval not displayed.  Recent Results (from the past 240 hour(s))  Blood Culture (routine x 2)     Status: None (Preliminary result)   Collection Time: 02/02/17  6:03 PM  Result Value Ref Range Status   Specimen Description BLOOD RIGHT FOREARM  Final   Special Requests   Final  BOTTLES DRAWN AEROBIC AND ANAEROBIC Blood Culture adequate volume   Culture   Final    NO GROWTH 2 DAYS Performed at [facility], [address], [place], [place] [address]    Report Status PENDING  Incomplete  Blood Culture (routine x 2)     Status: None (Preliminary result)   Collection Time: 02/02/17  6:08 PM  Result Value Ref Range Status   Specimen Description BLOOD LEFT HAND  Final   Special Requests   Final    BOTTLES DRAWN AEROBIC AND ANAEROBIC Blood Culture adequate volume   Culture   Final    NO GROWTH 2 DAYS Performed at [facility], [address], [place], [place] [address]    Report Status PENDING  Incomplete  MRSA PCR Screening     Status: None   Collection Time: 02/02/17 11:49 PM  Result Value Ref Range Status   MRSA by PCR NEGATIVE NEGATIVE Final    Comment:        The GeneXpert MRSA Assay (FDA approved for NASAL specimens only), is one component of a comprehensive MRSA colonization surveillance program. It is not intended to diagnose MRSA infection nor to guide or monitor treatment for MRSA infections.   Culture, Urine     Status: None   Collection Time: 02/04/17  8:53 AM  Result Value Ref Range Status   Specimen Description URINE, RANDOM  Final   Special Requests NONE  Final   Culture   Final    NO GROWTH Performed at [facility], [address] [place], [place] [address]    Report Status 02/05/2017 FINAL  Final     Liver Function Tests:  Recent Labs Lab 02/02/17 1839 02/03/17 0827 02/04/17 0333 02/05/17 0328  AST 152* 130* 135*  97*  ALT 50 42 46 43  ALKPHOS 123 95 104 92  BILITOT 17.8* 15.8* 16.5* 16.5*  PROT 6.3* 5.4* 5.7* 5.4*  ALBUMIN 1.4* 1.3* 1.4* 1.3*    Recent Labs Lab 02/02/17 1839 02/03/17 0827  LIPASE 65* 48    Recent Labs Lab 02/02/17 2126  AMMONIA 85*    Cardiac Enzymes:  Recent Labs Lab 02/02/17 2235 02/03/17 0320 02/03/17 0827  TROPONINI <0.03 <0.03 <0.03   BNP (last 3 results)  Recent Labs  11/11/16 1254  BNP 454.7*    ProBNP (last 3 results) No results for input(s): PROBNP in the last 8760 hours.    Studies: No results found.  Scheduled Meds: . citalopram  20 mg Oral Daily  . famotidine  10 mg Oral Daily  . feeding supplement  1 Container Oral TID BM  . feeding supplement (ENSURE ENLIVE)  237 mL Oral TID BM  . [START ON 02/06/2017] folic acid  1 mg Oral Daily  . ipratropium-albuterol  3 mL Nebulization Q6H WA  . lactulose  20 g Oral TID  . mouth rinse  15 mL Mouth Rinse BID  . methylPREDNISolone (SOLU-MEDROL) injection  60 mg Intravenous Q6H  . [START ON 02/06/2017] thiamine  100 mg Oral Daily      Time spent: 25 min  [facility]   [facility] Pager [phone]. If 7PM-7AM, please contact night-coverage at [email or web address], Office  [phone]  password [email or web address]  02/05/2017, 1:32 PM  LOS: 3 days

## 2017-02-05 NOTE — Progress Notes (Signed)
OT Cancellation Note  Patient Details Name: [name] MRN: [id number] DOB: 05-03-1961   Cancelled Treatment:    Reason Eval/Treat Not Completed: Medical issues which prohibited therapy  Spoke with RN.  Pt confused at this time. Will check back on pt next day Lise AuerLori Boby Eyer, [place] [phone]  Einar CrowEDDING, Raea Magallon D 02/05/2017, 3:29 PM

## 2017-02-05 NOTE — Progress Notes (Signed)
PHARMACIST - PHYSICIAN COMMUNICATION  DR:   Sharl MaLama  CONCERNING: IV to Oral Route Change Policy  RECOMMENDATION: This patient is receiving Folic acid & thiamine by the intravenous route.  Based on criteria approved by the Pharmacy and Therapeutics Committee, the intravenous medication(s) is/are being converted to the equivalent oral dose form(s).   DESCRIPTION: These criteria include:  The patient is eating (either orally or via tube) and/or has been taking other orally administered medications for a least 24 hours  The patient has no evidence of active gastrointestinal bleeding or impaired GI absorption (gastrectomy, short bowel, patient on TNA or NPO).  If you have questions about this conversion, please contact the Pharmacy Department  []   [phone]( [phone] )  [facility] []   [phone]( [phone] )  [facility] []   [phone]( [phone] )  [facility] []   [phone]( [phone] )  [facility] [x]   [phone]( [phone] )  [facility]   Emily FilbertLilliston, Shakevia Sarris PevelyMichelle, American Spine Surgery CenterRPH 02/05/2017 9:57 AM

## 2017-02-05 NOTE — Progress Notes (Signed)
PMT progress note  Mr [name] is awake alert resting in bed BP (!) 141/73   Pulse (!) 105   Temp 98 F (36.7 C) (Oral)   Resp (!) 22   Ht 6\' 1"  (1.854 m)   Wt 78.8 kg (173 lb 11.6 oz)   SpO2 97%   BMI 22.92 kg/m  Vomited early this am, received Zofran.  Tolerating some POs Labs and imaging noted. PT note reviewed.   Awake alert Appears weak, chronically ill S1 S2 Shallow clear breath sounds anteriorly Abdomen soft Trace edema Scleral icterus  Recommendations: Consider Reglan if ongoing nausea/vomiting.  Recommend SNF rehab with palliative services following on d/c No additional PMT recommendations at this time.   15 minutes spent Rosalin HawkingZeba Hodges Treiber MD [facility] [phone]  [phone]

## 2017-02-05 NOTE — Care Management Note (Signed)
Case Management Note  Patient Details  Name: Libby MawRobert Pritt MRN: [id number] Date of Birth: 06/20/1960  Subjective/Objective:                  Hospice patient cirrhosis of the liver.  Action/Plan: Date:  February 05, 2017 Chart reviewed for concurrent status and case management needs. Will continue to follow patient progress. Discharge Planning: following for needs Expected discharge date: [id number] Marcelle SmilingRhonda Davis, BSN, SuarezRN3, [place]CCM   [phone] Expected Discharge Date:                  Expected Discharge Plan:  Home w Hospice Care  In-House Referral:     Discharge planning Services  CM Consult  Post Acute Care Choice:    Choice offered to:     DME Arranged:    DME Agency:     HH Arranged:    HH Agency:     Status of Service:  In process, will continue to follow  If discussed at Long Length of Stay Meetings, dates discussed:    Additional Comments:  Golda AcreDavis, Rhonda Lynn, RN 02/05/2017, 9:14 AM

## 2017-02-05 NOTE — Progress Notes (Signed)
Physical Therapy Treatment Patient Details Name: [name] MRN: [id number] DOB: 1961-04-18 Today's Date: 02/05/2017    History of Present Illness 56 yo male admitted with sepsis, fatigue, hematuria. Hx of ETOH abuse, liver cirrhosis, COPD, noncompliance.     PT Comments    Pt assisted OOB and ambulated around room over to recliner. Pt fatigues quickly.  Follow Up Recommendations  SNF     Equipment Recommendations  None recommended by PT    Recommendations for Other Services       Precautions / Restrictions Precautions Precautions: Fall Precaution Comments: monitor O2 sats    Mobility  Bed Mobility Overal bed mobility: Needs Assistance Bed Mobility: Supine to Sit     Supine to sit: Min guard;HOB elevated     General bed mobility comments: min/guard for safety, lines  Transfers Overall transfer level: Needs assistance Equipment used: Rolling walker (2 wheeled) Transfers: Sit to/from Stand   Stand pivot transfers: Min assist       General transfer comment: Assist to rise, stabilize, control descent. verbal cues for safe technique  Ambulation/Gait Ambulation/Gait assistance: Min assist;+2 safety/equipment Ambulation Distance (Feet): 10 Feet Assistive device: Rolling walker (2 wheeled) Gait Pattern/deviations: Step-through pattern;Decreased stride length     General Gait Details: assist to steady, ambulated around bed to recliner, requested RN into room to manage lines/O2, pt fatigues quickly, remained on 4L O2 [place] for session   Stairs            Wheelchair Mobility    Modified Rankin (Stroke Patients Only)       Balance                                            Cognition Arousal/Alertness: Awake/alert   Overall Cognitive Status: Within Functional Limits for tasks assessed                                 General Comments: pt reported his brother was sitting in the recliner, no one present in room,  recognized no other person present once he was able to see recliner      Exercises      General Comments        Pertinent Vitals/Pain Pain Assessment: 0-10 Faces Pain Scale: Hurts little more Pain Location: back, abdomen Pain Descriptors / Indicators: Sore;Aching Pain Intervention(s): Limited activity within patient's tolerance;Repositioned;Monitored during session    Home Living                      Prior Function            PT Goals (current goals can now be found in the care plan section) Progress towards PT goals: Progressing toward goals    Frequency    Min 3X/week      PT Plan Current plan remains appropriate    Co-evaluation              AM-PAC PT "6 Clicks" Daily Activity  Outcome Measure  Difficulty turning over in bed (including adjusting bedclothes, sheets and blankets)?: A Lot Difficulty moving from lying on back to sitting on the side of the bed? : A Little Difficulty sitting down on and standing up from a chair with arms (e.g., wheelchair, bedside commode, etc,.)?: Unable Help needed moving to and from a bed  to chair (including a wheelchair)?: A Little Help needed walking in hospital room?: A Little Help needed climbing 3-5 steps with a railing? : A Lot 6 Click Score: 14    End of Session Equipment Utilized During Treatment: Gait belt Activity Tolerance: Patient limited by fatigue Patient left: with call bell/phone within reach;in chair;with chair alarm set Nurse Communication: Mobility status PT Visit Diagnosis: Difficulty in walking, not elsewhere classified (R26.2);Muscle weakness (generalized) (M62.81)     Time: 1610-9604 PT Time Calculation (min) (ACUTE ONLY): 16 min  Charges:  $Gait Training: 8-22 mins                    G Codes:       Zenovia Jarred, PT, DPT 02/05/2017 Pager: [phone]  Maida Sale E 02/05/2017, 1:38 PM

## 2017-02-06 ENCOUNTER — Inpatient Hospital Stay (HOSPITAL_COMMUNITY): Payer: Medicaid Other

## 2017-02-06 DIAGNOSIS — K769 Liver disease, unspecified: Secondary | ICD-10-CM

## 2017-02-06 DIAGNOSIS — A419 Sepsis, unspecified organism: Principal | ICD-10-CM

## 2017-02-06 DIAGNOSIS — K7460 Unspecified cirrhosis of liver: Secondary | ICD-10-CM

## 2017-02-06 DIAGNOSIS — K7011 Alcoholic hepatitis with ascites: Secondary | ICD-10-CM

## 2017-02-06 DIAGNOSIS — J189 Pneumonia, unspecified organism: Secondary | ICD-10-CM

## 2017-02-06 DIAGNOSIS — K7031 Alcoholic cirrhosis of liver with ascites: Secondary | ICD-10-CM

## 2017-02-06 DIAGNOSIS — E871 Hypo-osmolality and hyponatremia: Secondary | ICD-10-CM

## 2017-02-06 LAB — CBC
HEMATOCRIT: 29.2 % — AB (ref 39.0–52.0)
Hemoglobin: 10.4 g/dL — ABNORMAL LOW (ref 13.0–17.0)
MCH: 38.2 pg — ABNORMAL HIGH (ref 26.0–34.0)
MCHC: 35.6 g/dL (ref 30.0–36.0)
MCV: 107.4 fL — ABNORMAL HIGH (ref 78.0–100.0)
Platelets: 70 10*3/uL — ABNORMAL LOW (ref 150–400)
RBC: 2.72 MIL/uL — ABNORMAL LOW (ref 4.22–5.81)
RDW: 18.4 % — ABNORMAL HIGH (ref 11.5–15.5)
WBC: 11.5 10*3/uL — AB (ref 4.0–10.5)

## 2017-02-06 LAB — PHOSPHORUS: PHOSPHORUS: 3.2 mg/dL (ref 2.5–4.6)

## 2017-02-06 LAB — BASIC METABOLIC PANEL
Anion gap: 6 (ref 5–15)
BUN: 14 mg/dL (ref 6–20)
CO2: 20 mmol/L — ABNORMAL LOW (ref 22–32)
CREATININE: 0.78 mg/dL (ref 0.61–1.24)
Calcium: 7.9 mg/dL — ABNORMAL LOW (ref 8.9–10.3)
Chloride: 105 mmol/L (ref 101–111)
GFR calc Af Amer: >60 mL/min (ref >60)
Glucose, Bld: 111 mg/dL — ABNORMAL HIGH (ref 65–99)
POTASSIUM: 4.5 mmol/L (ref 3.5–5.1)
SODIUM: 131 mmol/L — AB (ref 135–145)

## 2017-02-06 LAB — URINALYSIS, ROUTINE W REFLEX MICROSCOPIC
Glucose, UA: NEGATIVE mg/dL
KETONES UR: NEGATIVE mg/dL
Leukocytes, UA: NEGATIVE
Nitrite: NEGATIVE
Protein, ur: NEGATIVE mg/dL
SQUAMOUS EPITHELIAL / LPF: NONE SEEN
Specific Gravity, Urine: 1.013 (ref 1.005–1.030)
pH: 6 (ref 5.0–8.0)

## 2017-02-06 LAB — PROTIME-INR
INR: 4.03 — AB
Prothrombin Time: 38.9 seconds — ABNORMAL HIGH (ref 11.4–15.2)

## 2017-02-06 LAB — COMPREHENSIVE METABOLIC PANEL
ALBUMIN: 1.5 g/dL — AB (ref 3.5–5.0)
ALT: 45 U/L (ref 17–63)
ANION GAP: 6 (ref 5–15)
AST: 93 U/L — ABNORMAL HIGH (ref 15–41)
Alkaline Phosphatase: 104 U/L (ref 38–126)
BILIRUBIN TOTAL: 18 mg/dL — AB (ref 0.3–1.2)
BUN: 9 mg/dL (ref 6–20)
CO2: 19 mmol/L — ABNORMAL LOW (ref 22–32)
Calcium: 7.7 mg/dL — ABNORMAL LOW (ref 8.9–10.3)
Chloride: 105 mmol/L (ref 101–111)
Creatinine, Ser: 0.63 mg/dL (ref 0.61–1.24)
GFR calc Af Amer: >60 mL/min (ref >60)
GFR calc non Af Amer: >60 mL/min (ref >60)
Glucose, Bld: 106 mg/dL — ABNORMAL HIGH (ref 65–99)
POTASSIUM: 4.4 mmol/L (ref 3.5–5.1)
SODIUM: 130 mmol/L — AB (ref 135–145)
TOTAL PROTEIN: 5.6 g/dL — AB (ref 6.5–8.1)

## 2017-02-06 LAB — MAGNESIUM
MAGNESIUM: 2 mg/dL (ref 1.7–2.4)
Magnesium: 1.9 mg/dL (ref 1.7–2.4)

## 2017-02-06 LAB — LACTIC ACID, PLASMA: Lactic Acid, Venous: 2.6 mmol/L (ref 0.5–1.9)

## 2017-02-06 LAB — AMMONIA: AMMONIA: 84 umol/L — AB (ref 9–35)

## 2017-02-06 MED ORDER — IPRATROPIUM-ALBUTEROL 0.5-2.5 (3) MG/3ML IN SOLN
3.0000 mL | Freq: Three times a day (TID) | RESPIRATORY_TRACT | Status: DC
  Administered 2017-02-07 – 2017-02-08 (×4): 3 mL via RESPIRATORY_TRACT
  Filled 2017-02-06 (×4): qty 3

## 2017-02-06 MED ORDER — RIFAXIMIN 550 MG PO TABS
550.0000 mg | ORAL_TABLET | Freq: Three times a day (TID) | ORAL | Status: DC
  Administered 2017-02-06: 550 mg
  Filled 2017-02-06 (×4): qty 1

## 2017-02-06 MED ORDER — FUROSEMIDE 10 MG/ML IJ SOLN
40.0000 mg | Freq: Once | INTRAMUSCULAR | Status: AC
  Administered 2017-02-06: 40 mg via INTRAVENOUS
  Filled 2017-02-06: qty 4

## 2017-02-06 MED ORDER — VANCOMYCIN HCL IN DEXTROSE 1-5 GM/200ML-% IV SOLN
1000.0000 mg | Freq: Two times a day (BID) | INTRAVENOUS | Status: DC
  Administered 2017-02-07 (×2): 1000 mg via INTRAVENOUS
  Filled 2017-02-06 (×2): qty 200

## 2017-02-06 MED ORDER — FUROSEMIDE 10 MG/ML IJ SOLN
80.0000 mg | Freq: Three times a day (TID) | INTRAMUSCULAR | Status: DC
  Administered 2017-02-06 – 2017-02-08 (×6): 80 mg via INTRAVENOUS
  Filled 2017-02-06 (×7): qty 8

## 2017-02-06 MED ORDER — LACTULOSE 10 GM/15ML PO SOLN
30.0000 g | Freq: Three times a day (TID) | ORAL | Status: DC
  Filled 2017-02-06: qty 45

## 2017-02-06 MED ORDER — LACTULOSE 10 GM/15ML PO SOLN
30.0000 g | Freq: Three times a day (TID) | ORAL | Status: DC
  Administered 2017-02-06: 30 g
  Filled 2017-02-06: qty 45

## 2017-02-06 MED ORDER — METHYLPREDNISOLONE SODIUM SUCC 40 MG IJ SOLR
40.0000 mg | Freq: Two times a day (BID) | INTRAMUSCULAR | Status: DC
  Administered 2017-02-06 – 2017-02-07 (×2): 40 mg via INTRAVENOUS
  Filled 2017-02-06 (×2): qty 1

## 2017-02-06 MED ORDER — METHYLPREDNISOLONE SODIUM SUCC 40 MG IJ SOLR: 40.0000 mg | Freq: Four times a day (QID) | INTRAMUSCULAR | Status: DC

## 2017-02-06 MED ORDER — DEXTROSE 5 % IV SOLN
1.0000 g | Freq: Three times a day (TID) | INTRAVENOUS | Status: DC
  Administered 2017-02-06 – 2017-02-07 (×3): 1 g via INTRAVENOUS
  Filled 2017-02-06 (×4): qty 1

## 2017-02-06 MED ORDER — VANCOMYCIN HCL 10 G IV SOLR
1250.0000 mg | Freq: Once | INTRAVENOUS | Status: AC
  Administered 2017-02-06: 1250 mg via INTRAVENOUS
  Filled 2017-02-06: qty 1250

## 2017-02-06 NOTE — Progress Notes (Signed)
Patient seen, continues to be tachypenic, unresponsive. Ammonia level is 84, unchanged from previous 85 four days ago. Started on Vancomycin and Cefepime for HCAP. CBC shows normal hemoglobin. Lasix was given for fluid overload,  Not much response with IV Lasix.  Will consult PCCM

## 2017-02-06 NOTE — Progress Notes (Signed)
Pharmacy Antibiotic Note  Bryce MawRobert [name] is a 56 y.o. male with generalized fatigue and persistent hematuria admitted on 02/02/2017 with sepsis. Given vancomycin and pip/tazo x 1 on 9/9, then narrowed to ceftriaxone for UTI. CXR 9/11 shows acute multilobar pneumonia. Pharmacy has been consulted for vancomycin and cefepime dosing for HAP.    Afebrile WBC 10.3 yesterday SCr 0.63 but trending up CrCl ~100 ml/min Cultures negative to date  Plan:  Vancomycin 1250mg  IV x 1 dose, then 1g IV q12h  Check trough at steady state, goal 15-20 mcg/ml  Cefepime 1g IV q8h  Follow up renal function & cultures, de-escalate as indicated  Height: 6\' 1"  (185.4 cm) Weight: 173 lb 11.6 oz (78.8 kg) IBW/kg (Calculated) : 79.9  Temp (24hrs), Avg:98.3 F (36.8 C), Min:98 F (36.7 C), Max:98.8 F (37.1 C)   Recent Labs Lab 02/02/17 1839 02/02/17 1850 02/02/17 2134 02/02/17 2355 02/03/17 0320 02/03/17 0827 02/03/17 1436 02/03/17 2005 02/04/17 0333 02/05/17 0328 02/06/17 0324  WBC 14.1*  --   --   --   --  12.2*  --   --   --  10.3  --   CREATININE 0.49*  --   --  0.41*  --  0.42* 0.40* 0.36* 0.35* 0.59* 0.63  LATICACIDVEN  --  4.33* 3.25* 3.2* 2.6*  --   --   --   --   --   --     Estimated Creatinine Clearance: 116.3 mL/min (by C-G formula based on SCr of 0.63 mg/dL).    No Known Allergies  Antimicrobials this admission: 9/7 Rocephin >> x1 ED 9/8 Zosyn >> 9/9 9/8 Vancomycin >>9/9 9/9 CTX >> 9/10 9/11 Vancomycin >> 9/11 Cefepime >>  Dose adjustments this admission:  Microbiology results: 9/7 MRSA PCR neg 9/7 BCx: ngtd 9/7 Hepatitis panel: neg 9/9 UCx: NGF - collected after abx given  Thank you for allowing pharmacy to be a part of this patient's care.  Loralee PacasErin Cissy Galbreath, PharmD, BCPS Pager: [phone] 02/06/2017 11:30 AM

## 2017-02-06 NOTE — Progress Notes (Signed)
OT Cancellation Note  Patient Details Name: Bryce MawRobert [name] MRN: [id number] DOB: [birth year]   Cancelled Treatment:    Reason Eval/Treat Not Completed: Fatigue/lethargy limiting ability to participate. Will check back tomorrow.  [name] 02/06/2017, 12:43 PM  Marica OtterMaryellen Yudith Norlander, OTR/L [phone] 02/06/2017

## 2017-02-06 NOTE — Consult Note (Signed)
Name: [name] MRN: [id number] DOB: 02-03-1961    ADMISSION DATE:  02/02/2017 CONSULTATION DATE:  02/06/17  REFERRING MD :  Dr. Sharl Ma   CHIEF COMPLAINT:  Altered Mental Status    HISTORY OF PRESENT ILLNESS:  56 y/o M admitted 9/7 with generalized weakness, hematuria and asking for alcohol detox.    He has a medical hx of alcoholic cirrhosis s/p TIPS in 02/2016 with ascites (on lasix/spironolactone, appears last paracentesis 08/2015), severe protein calorie malnutrition, BPH/urinary retention (previously had an indwelling foley, discontinued in June 2018).  He was seen on 8/16 for UTI and prescribed antibiotics which he never filled.  Of note, he checked in to the ER on 9/3 for UTI symptoms but left before he could be seen. He returned on 9/7 with the above complaints.  He was confirmed to have a significant UTI and hyponatremia.  MELD score on admit was 32.  He was admitted per [facility] for further evaluation.  He elected to be DNR/DNI on admit.  The patient was treated with the CIWA protocol > since MN on 9/11, he has received 12 mg IV ativan.  Throughout the day 9/11, he became progressively more somnolent.  He also had nausea and vomiting.  Labs 9/11 - Na 130, K 4.4, Cl 105, CO2 19, glucose 106, BUN 9/Sr Cr 0.63, Albumin 1.5, T.Bili 18 (up from 16.5), WBC 11.5, hgb 10.4 and platelets 70.  Follow up CXR 9/11 shows R>L airspace disease concerning for possible aspiration.   PCCM consulted for change in mental status.   PAST MEDICAL HISTORY :   has a past medical history of Cirrhosis of liver (HCC); Constipation; COPD (chronic obstructive pulmonary disease) (HCC); Depression; Dyspnea; ETOH abuse; GERD (gastroesophageal reflux disease); and Hypertension.   has a past surgical history that includes No past surgeries; Mandible fracture surgery (30 yrs. ago); ir generic historical (03/24/2016); Radiology with anesthesia (N/A, 03/24/2016); Umbilical hernia repair (N/A, 03/25/2016); ir generic historical  (03/15/2016); ir generic historical (11/03/2015); and ir generic historical (06/27/2016).  Prior to Admission medications   Medication Sig Start Date End Date Taking? Authorizing Provider  albuterol (PROVENTIL HFA;VENTOLIN HFA) 108 (90 Base) MCG/ACT inhaler Inhale 2 puffs into the lungs every 6 (six) hours as needed for wheezing or shortness of breath. 07/12/16   [name], MD  cefpodoxime (VANTIN) 200 MG tablet Take 1 tablet (200 mg total) by mouth 2 (two) times daily. 11/12/16   Leroy Sea, MD  cephALEXin (KEFLEX) 500 MG capsule Take 1 capsule (500 mg total) by mouth 3 (three) times daily. 01/11/17   Raeford Razor, MD  chlordiazePOXIDE (LIBRIUM) 5 MG capsule Please dispense 18 pills - Take 1 pill three times a day for 3 days, then Take 1 pill two times a day for 3 days, then Take 1 pill once a day for 3 days and stop. 11/12/16   Leroy Sea, MD  citalopram (CELEXA) 20 MG tablet Take 1 tablet (20 mg total) by mouth daily. 11/15/16   [name], MD  diclofenac sodium (VOLTAREN) 1 % GEL Apply 4 g topically 4 (four) times daily as needed (for pain). 04/01/16   Ghimire, Werner Lean, MD  feeding supplement, ENSURE ENLIVE, (ENSURE ENLIVE) LIQD Take 237 mLs by mouth 3 (three) times daily between meals. 04/01/16   Ghimire, Werner Lean, MD  folic acid (FOLVITE) 1 MG tablet Take 1 tablet (1 mg total) by mouth daily. 11/15/16   [name], MD  furosemide (LASIX) 20 MG tablet Take 1 tablet (  20 mg total) by mouth daily. 11/15/16   [name], MD  lactulose (CHRONULAC) 10 GM/15ML solution Take 30 mLs (20 g total) by mouth 3 (three) times daily. Patient taking differently: Take 10 g by mouth 2 (two) times daily.  07/12/16   [name], MD  ondansetron (ZOFRAN) 4 MG tablet Take 1 tablet (4 mg total) by mouth every 8 (eight) hours as needed for nausea or vomiting. 04/18/16   [name], MD  potassium chloride SA (K-DUR,KLOR-CON) 20 MEQ tablet Take 1 tablet (20 mEq total) by mouth daily. 07/12/16    [name], MD  ranitidine (ZANTAC) 150 MG tablet Take 1 tablet (150 mg total) by mouth 2 (two) times daily. 11/15/16   [name], MD  spironolactone (ALDACTONE) 50 MG tablet Take 1 tablet (50 mg total) by mouth daily. 11/15/16   [name], MD  tamsulosin (FLOMAX) 0.4 MG CAPS capsule Take 1 capsule (0.4 mg total) by mouth daily. 11/15/16   [name], MD  tamsulosin (FLOMAX) 0.4 MG CAPS capsule Take 1 capsule (0.4 mg total) by mouth daily. 01/11/17   Raeford Razor, MD  thiamine 100 MG tablet Take 1 tablet (100 mg total) by mouth daily. 11/15/16   [name], MD  VOLTAREN 1 % GEL APPLY 4 GRAMS TOPICALLY 4 TIMES DAILY. 04/05/16   [name], MD    No Known Allergies  FAMILY HISTORY:  family history includes Diabetes in his mother.  SOCIAL HISTORY:  reports that he has been smoking Cigarettes.  He has a 0.25 pack-year smoking history. He has never used smokeless tobacco. He reports that he does not drink alcohol or use drugs.  REVIEW OF SYSTEMS:   Unable to complete as patient is altered  SUBJECTIVE:   VITAL SIGNS: Temp:  [98.1 F (36.7 C)-99.4 F (37.4 C)] 99.4 F (37.4 C) (09/11 1200) Pulse Rate:  [26-139] 115 (09/11 1400) Resp:  [20-36] 32 (09/11 1400) BP: (118-166)/(60-101) 136/60 (09/11 1400) SpO2:  [77 %-97 %] 95 % (09/11 1400)  PHYSICAL EXAMINATION: General: not awake, elevated rr Neuro: not fc, perr 5 mm, yellow HEENT: yellow, jvd up PULM: exp ronchi, no wheezing CV: s1 s2 RRT  Mild, no r GI: soft, wave, low BS, no r Extremities:  Yellow, edema    Recent Labs Lab 02/04/17 0333 02/05/17 0328 02/06/17 0324  NA 128* 126* 130*  K 3.6 3.2* 4.4  CL 101 100* 105  CO2 19* 20* 19*  BUN <5* 6 9  CREATININE 0.35* 0.59* 0.63  GLUCOSE 75 176* 106*     Recent Labs Lab 02/03/17 0827 02/05/17 0328 02/06/17 1229  HGB 8.8* 9.5* 10.4*  HCT 23.7* 26.4* 29.2*  WBC 12.2* 10.3 11.5*  PLT 71* 76* 70*    Dg Chest Port 1v Same Day  Result Date:  02/06/2017 CLINICAL DATA:  Acute shortness of breath. EXAM: PORTABLE CHEST 1 VIEW COMPARISON:  02/02/2017, 01/11/2017 and earlier, including CT chest 03/24/2016. FINDINGS: Patient rotated to the right. Cardiac silhouette mildly enlarged for AP portable technique, unchanged. Interval development of airspace consolidation throughout the left lung, the right upper lobe, and to a lesser degree the right middle and lower lobes since the examination 4 days ago. Small bilateral pleural effusions. IMPRESSION: 1. Acute multilobar pneumonia (favored over asymmetric airspace pulmonary edema), a new finding since the examination 4 days ago. 2. New small bilateral pleural effusions. Electronically Signed   By: Hulan Saas M.D.   On: 02/06/2017 08:15      SIGNIFICANT EVENTS  9/07  Admit with hyponatremia, UTI   STUDIES:  9/08  ECHO >> LVEF 65-70%, mild focal basal hypertrophy of the septum, no RWMA, mild AV stenosis, mild LA dilation  CULTURES: BCx2 9/7 >>  UC 9/7 >> negative   ANTIBIOTICS: Vanco 9/11 >> Cefepime 9/11 >>   ASSESSMENT / PLAN:  Discussion:  56 y/o M with end-stage liver disease, ongoing ETOH abuse admitted with UTI.  He was treated with CIWA protocol (12mg  in last 24 hours).  The patient has been treated for UTI (NOS).  Admit MELD 32.  He has developed worsening mental status, nausea / vomiting (non-bloody) with concern for aspiration on CXR 9/11.  Acute reversible causes could be SBP, sedating effects of benzodiazepines, hyponatremia, aspiration PNA and encephalopathy in the setting of UTI superimposed on chronic liver disease (or combination of these).  In addition, he could be declining from UTI / not responding to therapy given underlying ESLD and ongoing ETOH abuse.      Acute Encephalopathy  P: Minimize sedating medications  Discontinue CIWA protocol, and use MD directed Supportive care   Sepsis secondary to UTI - must also consider SBP, aspiration with vomiting episodes    P: Continue abx per primary  Follow cultures    Nausea / Vomiting  Suspected Aspiration PNA  P: Aspiration precautions  Abx as above    Alcohol Abuse with Cirrhosis - s/p TIPS  Thrombocytopenia P: Monitor for bleeding  Supportive care  Repeat INR with next lab draw   GOC P: DNR/DNI in the event of arrest   STAFF NOTE: I, Rory Percyaniel Feinstein, MD FACP have personally reviewed patient's available data, including medical history, events of note, physical examination and test results as part of my evaluation. I have discussed with resident/NP and other care providers such as pharmacist, RN and RRT. In addition, I personally evaluated patient and elicited key findings of: not awake, not alert, does wake up and cuss 4 letter words to me, jaundiced severe, ronchi on expiration less of wheezing on expiration, abdo obese with ascites and wave, edema, pcxr which I reviewed shows NEW development of reticular int infiltrates apical predominant , some rotation to film, I have this to be aspiration hcap PNA, need to rule out pulm edema component, he is DNI noted with a horrible prognosis with terminal liver dz, he has severe encephalopathy also driving the poor airway protection that probably resulted in PNA PLAN: ETT would be futile and he/ family does not want intubation He is NOT a candidate for NIMV , high risk aspiration from this O2 support ABX agree with selection Maximize lasix Reduced steroids, not impressed this is COPD exacerbation related and steroids will worsen encpeh Place NGT for lactulose, add rifaximin Repeat UA, if dirty then add culture, color maybe bili alone related Repeat pcxr in am Ativan for now okay, doubt the use of precedex will change his outcome at all, but if we see suppression resp drive ( not noted now) then will need to consider precedex use, if this appears to be resolving resp status short term wise Strict NPO needed If worsening distress or hypoxia will  need to consider comfort care full ( we are close to that, and pcxr is severe findings) No family at bedside nor per RN have visited  The patient is critically ill with multiple organ systems failure and requires high complexity decision making for assessment and support, frequent evaluation and titration of therapies, application of advanced monitoring technologies and extensive interpretation of  multiple databases.   Critical Care Time devoted to patient care services described in this note is 45 Minutes. This time reflects time of care of this signee: Rory Percy, MD FACP. This critical care time does not reflect procedure time, or teaching time or supervisory time of PA/NP/Med student/Med Resident etc but could involve care discussion time. Rest per NP/medical resident whose note is outlined above and that I agree with   Mcarthur Rossetti. Tyson Alias, MD, FACP Pgr: [phone] [facility] 02/06/2017 3:25 PM

## 2017-02-06 NOTE — Progress Notes (Signed)
CSW consulted to assist with SNF placement. Pt is confused at this time. CSW will check back later today. If confusion persists CSW will contact pt's daughter to assist with dc planning needs.  Cori RazorJamie Avrey Flanagin LCSW [phone]

## 2017-02-06 NOTE — Progress Notes (Signed)
[facility]  PROGRESS NOTE  [name] ZOX:[id number] DOB: Nov 14, 1960 DOA: 02/02/2017 PCP: [name], MD   Brief HPI:    56 y.o. male with medical history significant of alcoholic liver cirrhosis (s/p TIPS in 02/2016), protein calorie malnutrition, GERD, depression, BPH and urinary retention  , COPD,  medical noncompliance, tobacco abuse  Presented with generalized fatigue and blood in urine for past few days. Continues to have abdominal swelling. Endorses abdominal discomfort. Endorses suprapubic pain and tenderness diarrhea nausea and some vomiting which has been nonbloody. He continues to have dysuria. Denies any fever at home. Patient has history of long-standing ascites and pedal edema  secondary to noncompliance with Spironolactone and Lasix. He was seen in January 11 2017 he began was found to have distended bladder which was probably causing pain and was found to have urinary tract infection foley catheter was placed he was discharged on antibiotics but never filled the prescription. Patient continues to drink daily.He was brought by EMS to ER secondary to abdominal pain on September 3 but left AMA.: History of urinary retention status post Foley catheter placement by [facility]. At some point his Foley catheter was discontinued he presented back in June 2018 with urinary retention TIPS done in 2017 Prior admissions complicated by DTs.  Patient's chest x-ray done today shows acute multilobar pneumonia.    Subjective   Patient seen and examined, was found to be tachypneic with altered mental status.   Assessment/Plan:     1. Healthcare associated pneumonia - patient found to be tachypneic with altered mental status. He is 12 L positive , fluid overload. Chest x-ray was obtained which showed acute multilobar pneumonia favored over asymmetric pulmonary edema. She did receive 1 dose of Lasix 40 mg IV this morning , with not much diuretic response. We'll start him on  vancomycin and cefepime per pharmacy consultation. Since blood pressure is stable, will change it to [facility] as patient is 12 L positive fluid overload. 2. Encephalopathy-likely from above, will check ammonia level. 3. ? Sepsis due to UTI- patient was initially  started on vancomycin and Zosyn. Blood cultures 2 is negative to date.Urine culture was not obtained at the time of admission.Urine culture obtained next day, shows no growth.  4. Alcohol abuse-patient started on CIWA protocol, social  Work consulted. 5. Alcoholic cirrhosis of liver with ascites-patient has acute decompensation in setting of ongoing alcohol abuse. Poor prognosis. MELD score 32. 6. COPD exacerbation-improved, continue  Solu-Medrol 40 miligrams IV every 6 hours, DuoNeb nebulizers every 6 hours 7. Hyponatremia-patient has chronic hyponatremia, his baseline close to 121, likely from chronic liver disease, dehydration. 8. Hypokalemia-replete, potassium is 4.4. Follow BMP in a.m. 9. Thrombocytopenia- secondary to alcoholic cirrhosis of liver,platelet count is 76.   10. Hypocalcemia- calcium 6.7, corrected calcium for albumin of 1.3 is 8.9 11. Hypomagnesemia- replete , today magnesium is 2.0 . Magnesium was 1.7, was given another dose of magnesium sulfate 2 g IV 1.     DVT prophylaxis: SCDs  Code Status: DO NOT RESUSCITATE/DO NOT INTUBATE  Family Communication: no family present at bedside   Disposition Plan: to be determined   Consultants:  none  Procedures:  none  Continuous infusions . sodium chloride 10 mL/hr at 02/06/17 0723  . cefTRIAXone (ROCEPHIN)  IV Stopped (02/05/17 1427)  . potassium chloride Stopped (02/05/17 0745)      Antibiotics:   Anti-infectives    Start     Dose/Rate Route Frequency Ordered Stop   02/04/17 1400  cefTRIAXone (ROCEPHIN) 1 g in dextrose 5 % 50 mL IVPB     1 g 100 mL/hr over 30 Minutes Intravenous Every 24 hours 02/04/17 1056     02/03/17 0400  piperacillin-tazobactam  (ZOSYN) IVPB 3.375 g  Status:  Discontinued     3.375 g 12.5 mL/hr over 240 Minutes Intravenous Every 8 hours 02/02/17 2254 02/04/17 1043   02/02/17 2359  vancomycin (VANCOCIN) IVPB 750 mg/150 ml premix  Status:  Discontinued     750 mg 150 mL/hr over 60 Minutes Intravenous Every 8 hours 02/02/17 2254 02/04/17 1043   02/02/17 1915  cefTRIAXone (ROCEPHIN) 2 g in dextrose 5 % 50 mL IVPB  Status:  Discontinued     2 g 100 mL/hr over 30 Minutes Intravenous Every 24 hours 02/02/17 1905 02/02/17 2248       Objective   Vitals:   02/06/17 0829 02/06/17 0900 02/06/17 1000 02/06/17 1100  BP:  (!) 162/101 136/70 (!) 139/100  Pulse:  (!) 123 (!) 108 (!) 125  Resp:  (!) 27 (!) 30 (!) 30  Temp:      TempSrc:      SpO2: 94%     Weight:      Height:        Intake/Output Summary (Last 24 hours) at 02/06/17 1107 Last data filed at 02/06/17 0600  Gross per 24 hour  Intake             1675 ml  Output              775 ml  Net              900 ml   Filed Weights   02/02/17 2131 02/03/17 0040  Weight: 74.8 kg (165 lb) 78.8 kg (173 lb 11.6 oz)     Physical Examination:  Physical Exam:   Neck: Supple, no deformities, masses, or tenderness Lungs: Tachypnea, bilateral rhonchi Heart: Regular rate and rhythm, S1 and S2 normal, no murmurs, rubs auscultated Abdomen: BS normoactive,soft,nondistended,non-tender to palpation,no organomegaly Extremities: No pretibial edema, no erythema, no cyanosis, no clubbing Neuro : Stuporous, opens eyes to sternal rub    Data Reviewed: I have personally reviewed following labs and imaging studies  CBG: No results for input(s): GLUCAP in the last 168 hours.  CBC:  Recent Labs Lab 02/02/17 1839 02/03/17 0827 02/05/17 0328  WBC 14.1* 12.2* 10.3  NEUTROABS 11.1*  --   --   HGB 10.1* 8.8* 9.5*  HCT 27.2* 23.7* 26.4*  MCV 99.6 102.6* 102.7*  PLT 77* 71* 76*    Basic Metabolic Panel:  Recent Labs Lab 02/02/17 1839  02/03/17 0827  02/03/17 1436 02/03/17 2005 02/04/17 0333 02/05/17 0328 02/06/17 0324  NA 118*  < > 123* 125* 125* 128* 126* 130*  K 3.5  < > 3.2* 3.1* 3.7 3.6 3.2* 4.4  CL 89*  < > 97* 99* 101 101 100* 105  CO2 18*  < > 18* 18* 18* 19* 20* 19*  GLUCOSE 83  < > 67 81 95 75 176* 106*  BUN 6  < > <5* 5* 5* <5* 6 9  CREATININE 0.49*  < > 0.42* 0.40* 0.36* 0.35* 0.59* 0.63  CALCIUM 7.3*  < > 6.7* 7.2* 7.2* 7.4* 7.5* 7.7*  MG 1.7  --  1.7  --   --   --   --  2.0  PHOS 3.0  --  2.5  --   --   --   --   --   < > =  values in this interval not displayed.  Recent Results (from the past 240 hour(s))  Blood Culture (routine x 2)     Status: None (Preliminary result)   Collection Time: 02/02/17  6:03 PM  Result Value Ref Range Status   Specimen Description BLOOD RIGHT FOREARM  Final   Special Requests   Final    BOTTLES DRAWN AEROBIC AND ANAEROBIC Blood Culture adequate volume   Culture   Final    NO GROWTH 4 DAYS Performed at [facility], [address] [place], [place] [address]    Report Status PENDING  Incomplete  Blood Culture (routine x 2)     Status: None (Preliminary result)   Collection Time: 02/02/17  6:08 PM  Result Value Ref Range Status   Specimen Description BLOOD LEFT HAND  Final   Special Requests   Final    BOTTLES DRAWN AEROBIC AND ANAEROBIC Blood Culture adequate volume   Culture   Final    NO GROWTH 4 DAYS Performed at [facility], [address], [place], [place] [address]    Report Status PENDING  Incomplete  MRSA PCR Screening     Status: None   Collection Time: 02/02/17 11:49 PM  Result Value Ref Range Status   MRSA by PCR NEGATIVE NEGATIVE Final    Comment:        The GeneXpert MRSA Assay (FDA approved for NASAL specimens only), is one component of a comprehensive MRSA colonization surveillance program. It is not intended to diagnose MRSA infection nor to guide or monitor treatment for MRSA infections.   Culture, Urine     Status: None   Collection  Time: 02/04/17  8:53 AM  Result Value Ref Range Status   Specimen Description URINE, RANDOM  Final   Special Requests NONE  Final   Culture   Final    NO GROWTH Performed at [facility], [address] [place], [place] [address]    Report Status 02/05/2017 FINAL  Final     Liver Function Tests:  Recent Labs Lab 02/02/17 1839 02/03/17 0827 02/04/17 0333 02/05/17 0328 02/06/17 0324  AST 152* 130* 135* 97* 93*  ALT 50 42 46 43 45  ALKPHOS 123 95 104 92 104  BILITOT 17.8* 15.8* 16.5* 16.5* 18.0*  PROT 6.3* 5.4* 5.7* 5.4* 5.6*  ALBUMIN 1.4* 1.3* 1.4* 1.3* 1.5*    Recent Labs Lab 02/02/17 1839 02/03/17 0827  LIPASE 65* 48    Recent Labs Lab 02/02/17 2126  AMMONIA 85*    Cardiac Enzymes:  Recent Labs Lab 02/02/17 2235 02/03/17 0320 02/03/17 0827  TROPONINI <0.03 <0.03 <0.03   BNP (last 3 results)  Recent Labs  11/11/16 1254  BNP 454.7*    ProBNP (last 3 results) No results for input(s): PROBNP in the last 8760 hours.    Studies: Dg Chest Port 1v Same Day  Result Date: 02/06/2017 CLINICAL DATA:  Acute shortness of breath. EXAM: PORTABLE CHEST 1 VIEW COMPARISON:  02/02/2017, 01/11/2017 and earlier, including CT chest 03/24/2016. FINDINGS: Patient rotated to the right. Cardiac silhouette mildly enlarged for AP portable technique, unchanged. Interval development of airspace consolidation throughout the left lung, the right upper lobe, and to a lesser degree the right middle and lower lobes since the examination 4 days ago. Small bilateral pleural effusions. IMPRESSION: 1. Acute multilobar pneumonia (favored over asymmetric airspace pulmonary edema), a new finding since the examination 4 days ago. 2. New small bilateral pleural effusions. Electronically Signed  By: Hulan Saas M.D.   On: 02/06/2017 08:15    Scheduled Meds: . citalopram  20 mg Oral Daily  . famotidine  10 mg Oral Daily  . feeding supplement (ENSURE ENLIVE)  237 mL Oral TID BM   . folic acid  1 mg Oral Daily  . ipratropium-albuterol  3 mL Nebulization Q6H WA  . lactulose  20 g Oral TID  . mouth rinse  15 mL Mouth Rinse BID  . methylPREDNISolone (SOLU-MEDROL) injection  40 mg Intravenous Q6H  . thiamine  100 mg Oral Daily      Time spent: 25 min  [facility]   [facility] Pager [phone]. If 7PM-7AM, please contact night-coverage at [email or web address], Office  [phone]  password [email or web address]  02/06/2017, 11:07 AM  LOS: 4 days

## 2017-02-07 DIAGNOSIS — E43 Unspecified severe protein-calorie malnutrition: Secondary | ICD-10-CM

## 2017-02-07 LAB — CULTURE, BLOOD (ROUTINE X 2)
CULTURE: NO GROWTH
Culture: NO GROWTH
Special Requests: ADEQUATE
Special Requests: ADEQUATE

## 2017-02-07 LAB — PHOSPHORUS: Phosphorus: 4.1 mg/dL (ref 2.5–4.6)

## 2017-02-07 LAB — BASIC METABOLIC PANEL WITH GFR
Anion gap: 7 (ref 5–15)
BUN: 19 mg/dL (ref 6–20)
CO2: 24 mmol/L (ref 22–32)
Calcium: 7.5 mg/dL — ABNORMAL LOW (ref 8.9–10.3)
Chloride: 101 mmol/L (ref 101–111)
Creatinine, Ser: 0.62 mg/dL (ref 0.61–1.24)
GFR calc Af Amer: >60 mL/min
GFR calc non Af Amer: >60 mL/min
Glucose, Bld: 151 mg/dL — ABNORMAL HIGH (ref 65–99)
Potassium: 3.3 mmol/L — ABNORMAL LOW (ref 3.5–5.1)
Sodium: 132 mmol/L — ABNORMAL LOW (ref 135–145)

## 2017-02-07 LAB — MAGNESIUM: Magnesium: 1.9 mg/dL (ref 1.7–2.4)

## 2017-02-07 MED ORDER — METHYLPREDNISOLONE SODIUM SUCC 40 MG IJ SOLR
40.0000 mg | Freq: Every day | INTRAMUSCULAR | Status: DC
  Administered 2017-02-08: 40 mg via INTRAVENOUS
  Filled 2017-02-07: qty 1

## 2017-02-07 MED ORDER — RIFAXIMIN 200 MG PO TABS
400.0000 mg | ORAL_TABLET | Freq: Three times a day (TID) | ORAL | Status: DC
  Administered 2017-02-07 – 2017-02-08 (×4): 400 mg via ORAL
  Filled 2017-02-07 (×7): qty 2

## 2017-02-07 MED ORDER — POTASSIUM CHLORIDE 10 MEQ/50ML IV SOLN
10.0000 meq | INTRAVENOUS | Status: AC
  Administered 2017-02-07 (×4): 10 meq via INTRAVENOUS
  Filled 2017-02-07 (×4): qty 50

## 2017-02-07 MED ORDER — LACTULOSE 10 GM/15ML PO SOLN
30.0000 g | Freq: Three times a day (TID) | ORAL | Status: DC
  Administered 2017-02-07: 30 g
  Filled 2017-02-07: qty 45

## 2017-02-07 MED ORDER — LACTULOSE 10 GM/15ML PO SOLN
30.0000 g | ORAL | Status: AC
  Administered 2017-02-07 (×3): 30 g
  Filled 2017-02-07 (×3): qty 45

## 2017-02-07 MED ORDER — PIPERACILLIN-TAZOBACTAM 3.375 G IVPB
3.3750 g | Freq: Three times a day (TID) | INTRAVENOUS | Status: DC
  Administered 2017-02-07 – 2017-02-08 (×4): 3.375 g via INTRAVENOUS
  Filled 2017-02-07 (×4): qty 50

## 2017-02-07 NOTE — Consult Note (Deleted)
Name: [name] MRN: [id number] DOB: [birth year]    ADMISSION DATE:  02/02/2017 CONSULTATION DATE:  02/06/17  REFERRING MD :  Dr. Sharl MaLama   CHIEF COMPLAINT:  Altered Mental Status    HISTORY OF PRESENT ILLNESS:  56 y/o M admitted 9/7 with generalized weakness, hematuria and asking for alcohol detox.    He has a medical hx of alcoholic cirrhosis s/p TIPS in 02/2016 with ascites (on lasix/spironolactone, appears last paracentesis 08/2015), severe protein calorie malnutrition, BPH/urinary retention (previously had an indwelling foley, discontinued in June 2018).  He was seen on 8/16 for UTI and prescribed antibiotics which he never filled.  Of note, he checked in to the ER on 9/3 for UTI symptoms but left before he could be seen. He returned on 9/7 with the above complaints.  He was confirmed to have a significant UTI and hyponatremia.  MELD score on admit was 32.  [facility].  He elected to be DNR/DNI on admit.  The patient was treated with the CIWA protocol > since MN on 9/11, he has received 12 mg IV ativan.  Throughout the day 9/11, he became progressively more somnolent.  He also had nausea and vomiting.  Labs 9/11 - Na 130, K 4.4, Cl 105, CO2 19, glucose 106, BUN 9/Sr Cr 0.63, Albumin 1.5, T.Bili 18 (up from 16.5), WBC 11.5, hgb 10.4 and platelets 70.  Follow up CXR 9/11 shows R>L airspace disease concerning for possible aspiration.   PCCM consulted for change in mental status.   SUBJECTIVE: RN reports no acute events.    VITAL SIGNS: Temp:  [97 F (36.1 C)-99.4 F (37.4 C)] 97.4 F (36.3 C) (09/12 0800) Pulse Rate:  [85-125] 103 (09/12 0800) Resp:  [18-35] 25 (09/12 0800) BP: (105-171)/(60-114) 128/72 (09/12 0800) SpO2:  [89 %-95 %] 91 % (09/12 0816)  PHYSICAL EXAMINATION: General:  Chronically ill appearing male lying in bed HEENT: MM pink/dry Neuro: attempts to open eyes at voice, moves extremities spontaneously  CV: s1s2 rrr, no m/r/g PULM:  even/non-labored, lungs bilaterally coarse rhonchi  WU:JWJXGI:soft, non-tender, bsx4 active  Extremities: warm/dry, trace LE edema  Skin: no rashes or lesions     Recent Labs Lab 02/06/17 0324 02/06/17 1520 02/07/17 0306  NA 130* 131* 132*  K 4.4 4.5 3.3*  CL 105 105 101  CO2 19* 20* 24  BUN 9 14 19   CREATININE 0.63 0.78 0.62  GLUCOSE 106* 111* 151*     Recent Labs Lab 02/03/17 0827 02/05/17 0328 02/06/17 1229  HGB 8.8* 9.5* 10.4*  HCT 23.7* 26.4* 29.2*  WBC 12.2* 10.3 11.5*  PLT 71* 76* 70*    Dg Abd 1 View  Result Date: 02/06/2017 CLINICAL DATA:  Encounter for NG tube placement Pt extremely noncompliant with positioning instructions and instructions on not to move EXAM: ABDOMEN - 1 VIEW COMPARISON:  11/11/2016 FINDINGS: Nasogastric tube has been placed, tip overlying the level of the stomach, side port within the region of the gastroesophageal junction. Visualized bowel gas pattern is nonobstructed. Tips shunt projects over the right upper quadrant. Multiple old rib fractures. Bibasilar lung opacities are partially imaged. IMPRESSION: 1. Interval placement of nasogastric tube. 2. Partially imaged lung opacities, consistent with airspace filling. Electronically Signed   By: Norva PavlovElizabeth  Brown M.D.   On: 02/06/2017 20:22   Dg Chest Port 1v Same Day  Result Date: 02/06/2017 CLINICAL DATA:  Acute shortness of breath. EXAM: PORTABLE CHEST 1 VIEW COMPARISON:  02/02/2017, 01/11/2017 and  earlier, including CT chest 03/24/2016. FINDINGS: Patient rotated to the right. Cardiac silhouette mildly enlarged for AP portable technique, unchanged. Interval development of airspace consolidation throughout the left lung, the right upper lobe, and to a lesser degree the right middle and lower lobes since the examination 4 days ago. Small bilateral pleural effusions. IMPRESSION: 1. Acute multilobar pneumonia (favored over asymmetric airspace pulmonary edema), a new finding since the examination 4 days  ago. 2. New small bilateral pleural effusions. Electronically Signed   By: Hulan Saas M.D.   On: 02/06/2017 08:15      SIGNIFICANT EVENTS  9/07  Admit with hyponatremia, UTI   STUDIES:  9/08  ECHO >> LVEF 65-70%, mild focal basal hypertrophy of the septum, no RWMA, mild AV stenosis, mild LA dilation  CULTURES: BCx2 9/7 >>  UC 9/7 >> negative   ANTIBIOTICS: Vanco 9/11 >> Cefepime 9/11 >>   ASSESSMENT / PLAN:  Discussion:  56 y/o M with end-stage liver disease, ongoing ETOH abuse admitted with UTI.  He was treated with CIWA protocol (  in last 24 hours).  The patient has been treated for UTI (NOS).  Admit MELD 32.  He has developed worsening mental status, nausea / vomiting (non-bloody) with concern for aspiration on CXR 9/11.  Acute reversible causes could be SBP, sedating effects of benzodiazepines, hyponatremia, aspiration PNA and encephalopathy in the setting of UTI superimposed on chronic liver disease (or combination of these).  In addition, he could be declining from UTI / not responding to therapy given underlying ESLD and ongoing ETOH abuse.      Acute Encephalopathy  P: Supportive care  Minimize sedating medications  Continue lactulose  Rapidly wean steroids to off > may contribute to delirium, no evidence of COPD exacerbation   Sepsis secondary to UTI - must also consider SBP, aspiration with vomiting episodes  P: Continue abx as above  Follow cultures    Nausea / Vomiting  Suspected Aspiration PNA  P: Aspiration precautions  Strict NPO   Alcohol Abuse with Cirrhosis - s/p TIPS  Thrombocytopenia Ascites P: Monitor CBC  PRN ativan with CIWA protocol  Continue folic acid, thiamine, MVI  Xifaxan TID for encephalopathy  Continue lasix 80 mg IV q8 for now Seizure precautions    GOC P: DNR/DNI in the event of arrest  If distress or worsens would progress to full comfort measuures   Family - no family at bedside am 9/12.    Canary Brim,  NP-C [facility] Pgr: [phone] or if no answer [phone] 02/07/2017, 11:10 AM

## 2017-02-07 NOTE — Progress Notes (Signed)
PT Cancellation Note  Patient Details Name: Bryce MawRobert [name] MRN: [id number] DOB: [birth year]   Cancelled Treatment:     unable to arouse pt enough to participate.   Felecia ShellingLori Narek Kniss  PTA [facility]  Acute  Rehab Pager      [phone]

## 2017-02-07 NOTE — Progress Notes (Signed)
OT Cancellation Note  Patient Details Name: [name] MRN: [id number] DOB: Nov 06, 1960   Cancelled Treatment:    Reason Eval/Treat Not Completed: Fatigue/lethargy limiting ability to participate. Pt has not been able to participate in OT evaluation.Will sign off at this time. If condition improves and pt would benefit, please reorder OT. Thank you.  Blessings Inglett 02/07/2017, 1:44 PM  Marica OtterMaryellen Burtis Imhoff, OTR/L [phone] 02/07/2017

## 2017-02-07 NOTE — Progress Notes (Signed)
Name: Bryce MawRobert [name] MRN: [id number] DOB: [birth year]    ADMISSION DATE:  02/02/2017 CONSULTATION DATE:  02/06/17  REFERRING MD :  Dr. Sharl MaLama   CHIEF COMPLAINT:  Altered Mental Status    HISTORY OF PRESENT ILLNESS:  56 y/o M admitted 9/7 with generalized weakness, hematuria and asking for alcohol detox.    He has a medical hx of alcoholic cirrhosis s/p TIPS in 02/2016 with ascites (on lasix/spironolactone, appears last paracentesis 08/2015), severe protein calorie malnutrition, BPH/urinary retention (previously had an indwelling foley, discontinued in June 2018).  He was seen on 8/16 for UTI and prescribed antibiotics which he never filled.  Of note, he checked in to the ER on 9/3 for UTI symptoms but left before he could be seen. He returned on 9/7 with the above complaints.  He was confirmed to have a significant UTI and hyponatremia.  MELD score on admit was 32.  [facility].  He elected to be DNR/DNI on admit.  The patient was treated with the CIWA protocol > since MN on 9/11, he has received 12 mg IV ativan.  Throughout the day 9/11, he became progressively more somnolent.  He also had nausea and vomiting.  Labs 9/11 - Na 130, K 4.4, Cl 105, CO2 19, glucose 106, BUN 9/Sr Cr 0.63, Albumin 1.5, T.Bili 18 (up from 16.5), WBC 11.5, hgb 10.4 and platelets 70.  Follow up CXR 9/11 shows R>L airspace disease concerning for possible aspiration.   PCCM consulted for change in mental status.   SUBJECTIVE: RN reports no acute events.    VITAL SIGNS: Temp:  [97 F (36.1 C)-99.4 F (37.4 C)] 97.4 F (36.3 C) (09/12 0800) Pulse Rate:  [85-125] 103 (09/12 0800) Resp:  [18-35] 25 (09/12 0800) BP: (105-171)/(60-114) 128/72 (09/12 0800) SpO2:  [89 %-95 %] 91 % (09/12 0816)  PHYSICAL EXAMINATION: General:  Chronically ill appearing male lying in bed HEENT: MM pink/dry Neuro: attempts to open eyes at voice, moves extremities spontaneously  CV: s1s2 rrr, no m/r/g PULM:  even/non-labored, lungs bilaterally coarse rhonchi  WU:JWJXGI:soft, non-tender, bsx4 active  Extremities: warm/dry, trace LE edema  Skin: no rashes or lesions     Recent Labs Lab 02/06/17 0324 02/06/17 1520 02/07/17 0306  NA 130* 131* 132*  K 4.4 4.5 3.3*  CL 105 105 101  CO2 19* 20* 24  BUN 9 14 19   CREATININE 0.63 0.78 0.62  GLUCOSE 106* 111* 151*     Recent Labs Lab 02/03/17 0827 02/05/17 0328 02/06/17 1229  HGB 8.8* 9.5* 10.4*  HCT 23.7* 26.4* 29.2*  WBC 12.2* 10.3 11.5*  PLT 71* 76* 70*    Dg Abd 1 View  Result Date: 02/06/2017 CLINICAL DATA:  Encounter for NG tube placement Pt extremely noncompliant with positioning instructions and instructions on not to move EXAM: ABDOMEN - 1 VIEW COMPARISON:  11/11/2016 FINDINGS: Nasogastric tube has been placed, tip overlying the level of the stomach, side port within the region of the gastroesophageal junction. Visualized bowel gas pattern is nonobstructed. Tips shunt projects over the right upper quadrant. Multiple old rib fractures. Bibasilar lung opacities are partially imaged. IMPRESSION: 1. Interval placement of nasogastric tube. 2. Partially imaged lung opacities, consistent with airspace filling. Electronically Signed   By: Norva PavlovElizabeth  Brown M.D.   On: 02/06/2017 20:22   Dg Chest Port 1v Same Day  Result Date: 02/06/2017 CLINICAL DATA:  Acute shortness of breath. EXAM: PORTABLE CHEST 1 VIEW COMPARISON:  02/02/2017, 01/11/2017 and  earlier, including CT chest 03/24/2016. FINDINGS: Patient rotated to the right. Cardiac silhouette mildly enlarged for AP portable technique, unchanged. Interval development of airspace consolidation throughout the left lung, the right upper lobe, and to a lesser degree the right middle and lower lobes since the examination 4 days ago. Small bilateral pleural effusions. IMPRESSION: 1. Acute multilobar pneumonia (favored over asymmetric airspace pulmonary edema), a new finding since the examination 4 days  ago. 2. New small bilateral pleural effusions. Electronically Signed   By: Hulan Saashomas  Lawrence M.D.   On: 02/06/2017 08:15      SIGNIFICANT EVENTS  9/07  Admit with hyponatremia, UTI   STUDIES:  9/08  ECHO >> LVEF 65-70%, mild focal basal hypertrophy of the septum, no RWMA, mild AV stenosis, mild LA dilation  CULTURES: BCx2 9/7 >>  UC 9/7 >> negative   ANTIBIOTICS: Vanco 9/11 >> Cefepime 9/11 >>   ASSESSMENT / PLAN:  Discussion:  56 y/o M with end-stage liver disease, ongoing ETOH abuse admitted with UTI.  He was treated with CIWA protocol (12mg  in last 24 hours).  The patient has been treated for UTI (NOS).  Admit MELD 32.  He has developed worsening mental status, nausea / vomiting (non-bloody) with concern for aspiration on CXR 9/11.  Acute reversible causes could be SBP, sedating effects of benzodiazepines, hyponatremia, aspiration PNA and encephalopathy in the setting of UTI superimposed on chronic liver disease (or combination of these).  In addition, he could be declining from UTI / not responding to therapy given underlying ESLD and ongoing ETOH abuse.      Acute Encephalopathy  P: Supportive care  Minimize sedating medications  Continue lactulose  Rapidly wean steroids to off > may contribute to delirium, no evidence of COPD exacerbation   Sepsis secondary to UTI - must also consider SBP, aspiration with vomiting episodes  P: Continue abx as above  Follow cultures    Nausea / Vomiting  Suspected Aspiration PNA  P: Aspiration precautions  Strict NPO   Alcohol Abuse with Cirrhosis - s/p TIPS  Thrombocytopenia Ascites P: Monitor CBC  PRN ativan with CIWA protocol  Continue folic acid, thiamine, MVI  Xifaxan TID for encephalopathy  Continue lasix 80 mg IV q8 for now Seizure precautions    GOC P: DNR/DNI in the event of arrest  If distress or worsens would progress to full comfort measuures   Family - no family at bedside am 9/12.    PCCM will be  available PRN.  Please call if new needs arise.    Canary BrimBrandi Ollis, NP-C [facility] Pgr: [phone] or if no answer [phone] 02/07/2017, 11:14 AM

## 2017-02-07 NOTE — Progress Notes (Signed)
[facility] Progress Note  Patient: [name] ZOX:[id number]   PCP: [name], MD DOB: 1961/02/15   DOA: 02/02/2017   DOS: 02/07/2017   Date of Service: the patient was seen and examined on 02/07/2017  Subjective: patient stuporous in the morning not following commands and not opening eyes. It during the off and on the patient is opening her eyes, still not communicating, still not following any command. Did receive lactulose and did have some bowel movement. No other acute events.  Brief hospital course: Pt. with PMH of alcoholic liver cirrhosis, protein calorie malnutrition, as PTI PS, GERD, depression, BPH, COPD, noncompliance,active smoker; admitted on 02/02/2017, presented with complaint of fatigue, was found to have Sepsis HCAP and hepatic encephalopathy. Currently further plan is continue current care and continue engage with discussion of goals of care with the family.  Assessment and Plan: 1. Healthcare associated pneumonia. Sepsis. Sepsis due to UTI. Presented with fatigue. Developed shortness of breath as well as volume overload in the hospitalization requiring transfer to step down unit. Currently receiving aggressive diuresis with IV Lasix. Also started on IV vancomycin and cefepime. Since MRSA PCR negative  Will discontinue vancomycin. Continue IV Zosyn. Not a candidate for intubation, DNR/DNI. On a candidate for an IV. Appreciate CCM input. Attempting to reach the family regarding goals of care discussion as well as clarity and aggressiveness of the care.  2. Liver cirrhosis. Alcohol abuse. Ascites. hepatitic encephalopathy. Continuing lactulose. It appears that the patient may have responded to lactulose somewhat. Continue rifaximin. Continue IV Lasix. Prognosis is severely poor and at present would highly recommend patient to consider hospice with comfort care.   3. Goals of care discussion. Attempt to reach the family was not successful, left a voicemail to  call back the hospital number.  4. COPD exacerbation. On IV steroids. Currently rapidly tapering.  5.hypokalemia. Hyponatremia. Hypocalcemia. Hypomagnesemia. Replacing. We'll recheck tomorrow.  6. Thrombocytopenia. Monitor platelet count.  Diet: NPO DVT Prophylaxis: mechanical compression device  Advance goals of care discussion: DNR DNI  Family Communication: no family was present at bedside, at the time of interview.  Disposition:  Discharge to be determined.  Consultants: PCCM Procedures: none  Antibiotics: Anti-infectives    Start     Dose/Rate Route Frequency Ordered Stop   02/07/17 1100  rifaximin (XIFAXAN) tablet 400 mg     400 mg Oral 3 times daily 02/07/17 1012     02/07/17 1000  piperacillin-tazobactam (ZOSYN) IVPB 3.375 g     3.375 g 12.5 mL/hr over 240 Minutes Intravenous Every 8 hours 02/07/17 0909     02/07/17 0000  vancomycin (VANCOCIN) IVPB 1000 mg/200 mL premix     1,000 mg 200 mL/hr over 60 Minutes Intravenous Every 12 hours 02/06/17 1118     02/06/17 1600  rifaximin (XIFAXAN) tablet 550 mg  Status:  Discontinued     550 mg Per Tube 3 times daily 02/06/17 1520 02/07/17 1011   02/06/17 1230  vancomycin (VANCOCIN) 1,250 mg in sodium chloride 0.9 % 250 mL IVPB     1,250 mg 166.7 mL/hr over 90 Minutes Intravenous  Once 02/06/17 1117 02/06/17 1419   02/06/17 1200  ceFEPIme (MAXIPIME) 1 g in dextrose 5 % 50 mL IVPB  Status:  Discontinued     1 g 100 mL/hr over 30 Minutes Intravenous Every 8 hours 02/06/17 1117 02/07/17 0901   02/04/17 1400  cefTRIAXone (ROCEPHIN) 1 g in dextrose 5 % 50 mL IVPB  Status:  Discontinued  1 g 100 mL/hr over 30 Minutes Intravenous Every 24 hours 02/04/17 1056 02/06/17 1117   02/03/17 0400  piperacillin-tazobactam (ZOSYN) IVPB 3.375 g  Status:  Discontinued     3.375 g 12.5 mL/hr over 240 Minutes Intravenous Every 8 hours 02/02/17 2254 02/04/17 1043   02/02/17 2359  vancomycin (VANCOCIN) IVPB 750 mg/150 ml premix  Status:   Discontinued     750 mg 150 mL/hr over 60 Minutes Intravenous Every 8 hours 02/02/17 2254 02/04/17 1043   02/02/17 1915  cefTRIAXone (ROCEPHIN) 2 g in dextrose 5 % 50 mL IVPB  Status:  Discontinued     2 g 100 mL/hr over 30 Minutes Intravenous Every 24 hours 02/02/17 1905 02/02/17 2248       Objective: Physical Exam: Vitals:   02/07/17 0346 02/07/17 0400 02/07/17 0800 02/07/17 0816  BP:  112/74 128/72   Pulse:  86 (!) 103   Resp:  20 (!) 25   Temp: (!) 97 F (36.1 C)  (!) 97.4 F (36.3 C)   TempSrc: Axillary  Axillary   SpO2:  95% 92% 91%  Weight:      Height:        Intake/Output Summary (Last 24 hours) at 02/07/17 1332 Last data filed at 02/07/17 1017  Gross per 24 hour  Intake              900 ml  Output             6325 ml  Net            -5425 ml   Filed Weights   02/02/17 2131 02/03/17 0040  Weight: 74.8 kg (165 lb) 78.8 kg (173 lb 11.6 oz)   General: Awake and DISOriented to Time, Place and Person. Appear in severe distress, Eyes: PERRL, Conjunctiva normal ENT: Oral Mucosa clear moist. Neck: NO JVD, no Abnormal Mass Or lumps Cardiovascular: S1 and S2 Present, no Murmur, Peripheral Pulses Present Respiratory: increased respiratory effort, Bilateral Air entry equal and Decreased, no use of accessory muscle, bilateralCrackles, no wheezes Abdomen: Bowel Sound present, Soft and no tenderness,  Skin: no redness, no Rash, no induration Extremities: no Pedal edema, no calf tenderness Neurologic: Grossly no focal neuro deficit. Bilaterally Equal motor strength  Data Reviewed: CBC:  Recent Labs Lab 02/02/17 1839 02/03/17 0827 02/05/17 0328 02/06/17 1229  WBC 14.1* 12.2* 10.3 11.5*  NEUTROABS 11.1*  --   --   --   HGB 10.1* 8.8* 9.5* 10.4*  HCT 27.2* 23.7* 26.4* 29.2*  MCV 99.6 102.6* 102.7* 107.4*  PLT 77* 71* 76* 70*   Basic Metabolic Panel:  Recent Labs Lab 02/02/17 1839  02/03/17 0827  02/04/17 0333 02/05/17 0328 02/06/17 0324 02/06/17 1520  02/07/17 0306  NA 118*  < > 123*  < > 128* 126* 130* 131* 132*  K 3.5  < > 3.2*  < > 3.6 3.2* 4.4 4.5 3.3*  CL 89*  < > 97*  < > 101 100* 105 105 101  CO2 18*  < > 18*  < > 19* 20* 19* 20* 24  GLUCOSE 83  < > 67  < > 75 176* 106* 111* 151*  BUN 6  < > <5*  < > <5* CREATININE 0.49*  < > 0.42*  < > 0.35* 0.59* 0.63 0.78 0.62  CALCIUM 7.3*  < > 6.7*  < > 7.4* 7.5* 7.7* 7.9* 7.5*  MG 1.7  --  1.7  --   --   --  2.0 1.9 1.9  PHOS 3.0  --  2.5  --   --   --   --  3.2 4.1  < > = values in this interval not displayed.  Liver Function Tests:  Recent Labs Lab 02/02/17 1839 02/03/17 0827 02/04/17 0333 02/05/17 0328 02/06/17 0324  AST 152* 130* 135* 97* 93*  ALT 50 42 46 43 45  ALKPHOS 123 95 104 92 104  BILITOT 17.8* 15.8* 16.5* 16.5* 18.0*  PROT 6.3* 5.4* 5.7* 5.4* 5.6*  ALBUMIN 1.4* 1.3* 1.4* 1.3* 1.5*    Recent Labs Lab 02/02/17 1839 02/03/17 0827  LIPASE 65* 48    Recent Labs Lab 02/02/17 2126 02/06/17 1143  AMMONIA 85* 84*   Coagulation Profile:  Recent Labs Lab 02/02/17 1839 02/03/17 0827 02/06/17 1520  INR 2.81 3.46 4.03*   Cardiac Enzymes:  Recent Labs Lab 02/02/17 2235 02/03/17 0320 02/03/17 0827  TROPONINI <0.03 <0.03 <0.03   BNP (last 3 results) No results for input(s): PROBNP in the last 8760 hours. CBG: No results for input(s): GLUCAP in the last 168 hours. Studies: Dg Abd 1 View  Result Date: 02/06/2017 CLINICAL DATA:  Encounter for NG tube placement Pt extremely noncompliant with positioning instructions and instructions on not to move EXAM: ABDOMEN - 1 VIEW COMPARISON:  11/11/2016 FINDINGS: Nasogastric tube has been placed, tip overlying the level of the stomach, side port within the region of the gastroesophageal junction. Visualized bowel gas pattern is nonobstructed. Tips shunt projects over the right upper quadrant. Multiple old rib fractures. Bibasilar lung opacities are partially imaged. IMPRESSION: 1. Interval placement of  nasogastric tube. 2. Partially imaged lung opacities, consistent with airspace filling. Electronically Signed   By: Norva PavlovElizabeth  Brown M.D.   On: 02/06/2017 20:22    Scheduled Meds: . famotidine  10 mg Oral Daily  . feeding supplement (ENSURE ENLIVE)  237 mL Oral TID BM  . folic acid  1 mg Oral Daily  . furosemide  80 mg Intravenous Q8H  . ipratropium-albuterol  3 mL Nebulization TID  . lactulose  30 g Per Tube Q2H  . mouth rinse  15 mL Mouth Rinse BID  . [START ON 02/08/2017] methylPREDNISolone (SOLU-MEDROL) injection  40 mg Intravenous Daily  . rifaximin  400 mg Oral TID   Continuous Infusions: . sodium chloride 10 mL/hr at 02/07/17 0800  . piperacillin-tazobactam (ZOSYN)  IV 3.375 g (02/07/17 1017)  . potassium chloride 10 mEq (02/07/17 1200)  . vancomycin 1,000 mg (02/07/17 1215)   PRN Meds: albuterol, LORazepam  Time spent: 35 minutes  Author: Lynden OxfordPranav Lidia Clavijo, MD [facility] Pager: [phone] 02/07/2017 1:32 PM  If 7PM-7AM, please contact night-coverage at [email or web address], password [email or web address]

## 2017-02-07 NOTE — Progress Notes (Signed)
Pharmacy Antibiotic Note  Bryce MawRobert [name] is a 56 y.o. male with generalized fatigue and persistent hematuria admitted on 02/02/2017 with sepsis. Given vancomycin and pip/tazo x 1 on 9/9, then narrowed to ceftriaxone for UTI. CXR 9/11 shows acute multilobar pneumonia. Pharmacy has been consulted for vancomycin and cefepime dosing for HAP.    Today, 02/07/2017 - changing Cefepime to Zosyn for aspiration coverage  Afebrile WBC 11.5 yesterday SCr 0.62 stable from yesterday CrCl ~100 ml/min Cultures negative to date  Plan:  Zosyn 3.375gm IV q8h (4hr extended infusions)  Vancomycin 1250mg  IV x 1 dose, then 1g IV q12h  Check trough at steady state, goal 15-20 mcg/ml  Follow up renal function & cultures, de-escalate as indicated  Height: 6\' 1"  (185.4 cm) Weight: 173 lb 11.6 oz (78.8 kg) IBW/kg (Calculated) : 79.9  Temp (24hrs), Avg:98.1 F (36.7 C), Min:97 F (36.1 C), Max:99.4 F (37.4 C)   Recent Labs Lab 02/02/17 1839 02/02/17 1850 02/02/17 2134 02/02/17 2355 02/03/17 0320 02/03/17 0827  02/04/17 0333 02/05/17 0328 02/06/17 0324 02/06/17 1229 02/06/17 1520 02/07/17 0306  WBC 14.1*  --   --   --   --  12.2*  --   --  10.3  --  11.5*  --   --   CREATININE 0.49*  --   --  0.41*  --  0.42*  < > 0.35* 0.59* 0.63  --  0.78 0.62  LATICACIDVEN  --  4.33* 3.25* 3.2* 2.6*  --   --   --   --   --   --  2.6*  --   < > = values in this interval not displayed.  Estimated Creatinine Clearance: 116.3 mL/min (by C-G formula based on SCr of 0.62 mg/dL).    No Known Allergies  Antimicrobials this admission: 9/7 rocephin >> x1 ED 9/8 zosyn >> 9/9 9/8 vancomycin >>9/9 9/9 CTX >> 9/10 9/11 Cefepime >> 9/12 9/11 Vancomycin >> 9/12 Zosyn >>  Dose adjustments this admission:  Microbiology results: 9/7 MRSA PCR neg 9/7 BCx: ngtd 9/7 Hepatitis panel: neg 9/9 UCx: NGF - collected after V/Z 9/11 BCx: sent  Thank you for allowing pharmacy to be a part of this patient's care.  Loralee PacasErin  Sangeeta Youse, PharmD, BCPS Pager: [phone] 02/07/2017 9:10 AM

## 2017-02-08 LAB — CBC WITH DIFFERENTIAL/PLATELET
BASOS ABS: 0 10*3/uL (ref 0.0–0.1)
BASOS PCT: 0 %
Eosinophils Absolute: 0 10*3/uL (ref 0.0–0.7)
Eosinophils Relative: 0 %
HEMATOCRIT: 31.8 % — AB (ref 39.0–52.0)
Hemoglobin: 11.1 g/dL — ABNORMAL LOW (ref 13.0–17.0)
LYMPHS PCT: 12 %
Lymphs Abs: 1.9 10*3/uL (ref 0.7–4.0)
MCH: 36.9 pg — ABNORMAL HIGH (ref 26.0–34.0)
MCHC: 34.9 g/dL (ref 30.0–36.0)
MCV: 105.6 fL — ABNORMAL HIGH (ref 78.0–100.0)
Monocytes Absolute: 0.6 10*3/uL (ref 0.1–1.0)
Monocytes Relative: 4 %
NEUTROS ABS: 14.2 10*3/uL — AB (ref 1.7–7.7)
NEUTROS PCT: 85 %
Platelets: 54 10*3/uL — ABNORMAL LOW (ref 150–400)
RBC: 3.01 MIL/uL — ABNORMAL LOW (ref 4.22–5.81)
RDW: 18.3 % — ABNORMAL HIGH (ref 11.5–15.5)
WBC: 16.7 10*3/uL — AB (ref 4.0–10.5)

## 2017-02-08 LAB — COMPREHENSIVE METABOLIC PANEL
ALBUMIN: 1.6 g/dL — AB (ref 3.5–5.0)
ALT: 70 U/L — ABNORMAL HIGH (ref 17–63)
ANION GAP: 8 (ref 5–15)
AST: 141 U/L — ABNORMAL HIGH (ref 15–41)
Alkaline Phosphatase: 77 U/L (ref 38–126)
BILIRUBIN TOTAL: 25.4 mg/dL — AB (ref 0.3–1.2)
BUN: 25 mg/dL — ABNORMAL HIGH (ref 6–20)
CO2: 34 mmol/L — ABNORMAL HIGH (ref 22–32)
Calcium: 7.4 mg/dL — ABNORMAL LOW (ref 8.9–10.3)
Chloride: 97 mmol/L — ABNORMAL LOW (ref 101–111)
Creatinine, Ser: 0.78 mg/dL (ref 0.61–1.24)
Glucose, Bld: 124 mg/dL — ABNORMAL HIGH (ref 65–99)
POTASSIUM: 2.1 mmol/L — AB (ref 3.5–5.1)
Sodium: 139 mmol/L (ref 135–145)
TOTAL PROTEIN: 5.6 g/dL — AB (ref 6.5–8.1)

## 2017-02-08 LAB — PROTIME-INR
INR: 3.8
Prothrombin Time: 37.2 seconds — ABNORMAL HIGH (ref 11.4–15.2)

## 2017-02-08 LAB — AMMONIA: AMMONIA: 50 umol/L — AB (ref 9–35)

## 2017-02-08 LAB — MAGNESIUM: Magnesium: 1.8 mg/dL (ref 1.7–2.4)

## 2017-02-08 MED ORDER — HALOPERIDOL LACTATE 2 MG/ML PO CONC
0.5000 mg | ORAL | Status: DC | PRN
  Filled 2017-02-08: qty 0.3

## 2017-02-08 MED ORDER — ACETAMINOPHEN 650 MG RE SUPP: 650.0000 mg | Freq: Four times a day (QID) | RECTAL | Status: DC | PRN

## 2017-02-08 MED ORDER — HALOPERIDOL 0.5 MG PO TABS
0.5000 mg | ORAL_TABLET | ORAL | Status: DC | PRN
  Filled 2017-02-08: qty 1

## 2017-02-08 MED ORDER — POTASSIUM CHLORIDE 20 MEQ/15ML (10%) PO SOLN
40.0000 meq | Freq: Two times a day (BID) | ORAL | Status: DC
  Administered 2017-02-08: 40 meq
  Filled 2017-02-08: qty 30

## 2017-02-08 MED ORDER — LORAZEPAM 2 MG/ML IJ SOLN: 1.0000 mg | INTRAMUSCULAR | Status: DC | PRN

## 2017-02-08 MED ORDER — ONDANSETRON 4 MG PO TBDP: 4.0000 mg | ORAL_TABLET | Freq: Four times a day (QID) | ORAL | Status: DC | PRN

## 2017-02-08 MED ORDER — MORPHINE SULFATE (PF) 2 MG/ML IV SOLN: 2.0000 mg | INTRAVENOUS | Status: DC | PRN

## 2017-02-08 MED ORDER — POTASSIUM CHLORIDE 10 MEQ/100ML IV SOLN
10.0000 meq | INTRAVENOUS | Status: AC
  Administered 2017-02-08 (×4): 10 meq via INTRAVENOUS
  Filled 2017-02-08 (×4): qty 100

## 2017-02-08 MED ORDER — GLYCOPYRROLATE 0.2 MG/ML IJ SOLN
0.2000 mg | INTRAMUSCULAR | Status: DC | PRN
  Filled 2017-02-08: qty 1

## 2017-02-08 MED ORDER — ALBUTEROL SULFATE (2.5 MG/3ML) 0.083% IN NEBU: 2.5000 mg | INHALATION_SOLUTION | RESPIRATORY_TRACT | Status: DC | PRN

## 2017-02-08 MED ORDER — LORAZEPAM 2 MG/ML PO CONC: 1.0000 mg | ORAL | Status: DC | PRN

## 2017-02-08 MED ORDER — POTASSIUM CHLORIDE 10 MEQ/100ML IV SOLN: 10.0000 meq | INTRAVENOUS | Status: DC

## 2017-02-08 MED ORDER — HALOPERIDOL LACTATE 5 MG/ML IJ SOLN
0.5000 mg | INTRAMUSCULAR | Status: DC | PRN
  Administered 2017-02-09: 0.5 mg via INTRAVENOUS
  Filled 2017-02-08: qty 1

## 2017-02-08 MED ORDER — ACETAMINOPHEN 325 MG PO TABS: 650.0000 mg | ORAL_TABLET | Freq: Four times a day (QID) | ORAL | Status: DC | PRN

## 2017-02-08 MED ORDER — FAMOTIDINE 20 MG PO TABS
10.0000 mg | ORAL_TABLET | Freq: Every day | ORAL | Status: DC
  Filled 2017-02-08: qty 1

## 2017-02-08 MED ORDER — SODIUM CHLORIDE 0.9% FLUSH: 3.0000 mL | INTRAVENOUS | Status: DC | PRN

## 2017-02-08 MED ORDER — LORAZEPAM 1 MG PO TABS: 1.0000 mg | ORAL_TABLET | ORAL | Status: DC | PRN

## 2017-02-08 MED ORDER — ONDANSETRON HCL 4 MG/2ML IJ SOLN: 4.0000 mg | Freq: Four times a day (QID) | INTRAMUSCULAR | Status: DC | PRN

## 2017-02-08 MED ORDER — GLYCOPYRROLATE 1 MG PO TABS
1.0000 mg | ORAL_TABLET | ORAL | Status: DC | PRN
  Filled 2017-02-08: qty 1

## 2017-02-08 MED ORDER — SODIUM CHLORIDE 0.9% FLUSH
3.0000 mL | Freq: Two times a day (BID) | INTRAVENOUS | Status: DC
  Administered 2017-02-08 – 2017-02-12 (×9): 3 mL via INTRAVENOUS

## 2017-02-08 MED ORDER — SODIUM CHLORIDE 0.9 % IV SOLN: 250.0000 mL | INTRAVENOUS | Status: DC | PRN

## 2017-02-08 NOTE — Progress Notes (Signed)
Nutrition Follow-up  DOCUMENTATION CODES:   Severe malnutrition in context of chronic illness  INTERVENTION:  - RD will continue to monitor POC/GOC and associated nutrition-related needs. - Will d/c Ensure Enlive as pt is NPO and inappropriate for PO intake given current mentation.   NUTRITION DIAGNOSIS:   Malnutrition related to chronic illness (ETOH cirrhosis with ascites) as evidenced by severe depletion of body fat, severe depletion of muscle mass. -ongoing  GOAL:   Patient will meet greater than or equal to 90% of their needs -unable to meet  MONITOR:   Weight trends, Labs, I & O's, Other (Comment) (POC/GOC)  ASSESSMENT:   56 y.o. male with medical history significant of alcoholic liver cirrhosis (s/p TIPS in 02/2016), protein calorie malnutrition, GERD, depression, BPH and urinary retention  , COPD,  medical noncompliance, tobacco abuse  9/13 Diet advanced from CLD to Soft on 9/10 and Ensure Enlive ordered TID at that time. Diet then changed to NPO 9/11 afternoon and has been NPO since that time d/t pt being non-responsive, not following commands, poor mentation despite lactulose with several BMs. Notes indicate that attempts have been made to contact family to discuss GOC but unable to successfully reach. Pt is already DNR/DNI. Weight trending down and is now -4 lbs/1.7 kg from admission weight; noted high dose of Lasix each day since 9/11. NGT place 9/11 in order to administer medications.   Medications reviewed; 10 mg oral Pepcid/day, 1 mg oral folic acid/day, 80 mg IV Lasix TID, 30 g lactulose per NGT TID, 40 mg Solu-medrol/day, 10 mEq IV KCl x4 runs today and x4 runs yesterday, 40 mEq KCl per NGT BID.  Labs reviewed; K: 2.1 mmol/L, Cl: 97 mmol/L, BUN: 25 mg/dL, Ca: 7.4 mg/dL, LFTs elevated, ammonia: 50 umol/L.    9/9 - Patient with continued ETOH abuse PTA.  - Pt with questionable nutrition intake PTA d/t daily ETOH consumption.  - Currently on clear liquid diet.  -  Have ordered Boost Breeze supplements until patient can have Ensure supplements.  - Per chart review, pt's weight fluctuates d/t fluid accumulation.  - Pt with ascites.   Nutrition-Focused physical exam completed. Findings are severe fat depletion, severe muscle depletion, and mild-severe edema.    Diet Order:  Diet NPO time specified  Skin:  Reviewed, no issues  Last BM:  9/12  Height:   Ht Readings from Last 1 Encounters:  02/03/17 6\' 1"  (1.854 m)    Weight:   Wt Readings from Last 1 Encounters:  02/08/17 161 lb 2.5 oz (73.1 kg)    Ideal Body Weight:  83.6 kg  BMI:  Body mass index is 21.26 kg/m.  Estimated Nutritional Needs:   Kcal:  2200-2400  Protein:  110-120g  Fluid:  2L/day  EDUCATION NEEDS:   No education needs identified at this time     Trenton GammonJessica Vlasta Baskin, MS, RD, LDN, CNSC Inpatient Clinical Dietitian Pager # [phone] After hours/weekend pager # [phone]

## 2017-02-08 NOTE — Care Management Note (Signed)
Case Management Note  Patient Details  Name: [name] MRN: [id number] Date of Birth: 1960/09/04  Subjective/Objective:                  Copd-nebs and iv steroids  Action/Plan: Date:  February 08, 2017 Chart reviewed for concurrent status and case management needs. Will continue to follow patient progress. Discharge Planning: following for needs Expected discharge date: [id number] Marcelle SmilingRhonda Davis, BSN, Quinnipiac UniversityRN3, [place]CCM   [phone]  Expected Discharge Date:                  Expected Discharge Plan:  Home w Hospice Care  In-House Referral:     Discharge planning Services  CM Consult  Post Acute Care Choice:    Choice offered to:     DME Arranged:    DME Agency:     HH Arranged:    HH Agency:     Status of Service:  In process, will continue to follow  If discussed at Long Length of Stay Meetings, dates discussed:    Additional Comments:  Golda AcreDavis, Rhonda Lynn, RN 02/08/2017, 8:44 AM

## 2017-02-08 NOTE — Progress Notes (Signed)
CRITICAL VALUE ALERT  Critical Value:  K= 2.1  Date & Time Notied:  02/08/17 @ 0400  Provider Notified: Dr. Antionette Charpyd  Orders Received/Actions taken: order for 4 K runs received

## 2017-02-08 NOTE — Progress Notes (Signed)
                                                                                                                                                                                                         Daily Progress Note   Patient Name: [name]       Date: 02/08/2017 DOB: 07/29/1960  Age: 56 y.o. MRN#: [id number] Attending Physician: Patel, Pranav M, MD Primary Care Physician: [name], [name], MD Admit Date: 02/02/2017  Reason for Consultation/Follow-up: Establishing goals of care  Subjective: I met today with patient's daughter and brother (whom he lives with).  [name] is unresponsive and unable to participate in conversation.  Values and goals of care important to patient and family were attempted to be elicited.  We discussed [name]'s clinical course and continued decline despite medical therapies.  We discussed difference between a aggressive medical intervention path and a palliative, comfort focused care path.    Concept of hospice discussed.  Questions and concerns addressed.   PMT will continue to support holistically.  See below:  Length of Stay: 6  Current Medications: Scheduled Meds:  . famotidine  10 mg Oral Daily  . folic acid  1 mg Oral Daily  . furosemide  80 mg Intravenous Q8H  . ipratropium-albuterol  3 mL Nebulization TID  . lactulose  30 g Per Tube TID  . mouth rinse  15 mL Mouth Rinse BID  . methylPREDNISolone (SOLU-MEDROL) injection  40 mg Intravenous Daily  . potassium chloride  40 mEq Per Tube BID  . rifaximin  400 mg Oral TID    Continuous Infusions: . sodium chloride 10 mL/hr at 02/07/17 0800  . piperacillin-tazobactam (ZOSYN)  IV Stopped (02/08/17 0621)    PRN Meds: albuterol, LORazepam  Physical Exam    General: Somnolent, ill appearing, in no acute distress Heart: Regular rate and rhythm. No murmur appreciated. Lungs: Diminished air movement, scattered crackles Abdomen: Soft, nontender, nondistended, positive bowel sounds.  Ext:  No significant edema Skin: Warm and dry       Vital Signs: BP (!) 100/59   Pulse 87   Temp 97.9 F (36.6 C) (Axillary)   Resp (!) 26   Ht 6' 1" (1.854 m)   Wt 73.1 kg (161 lb 2.5 oz)   SpO2 94%   BMI 21.26 kg/m  SpO2: SpO2: 94 % O2 Device: O2 Device: Nasal Cannula O2 Flow Rate: O2 Flow Rate (L/min): 6 L/min  Intake/output summary:  Intake/Output Summary (Last 24 hours) at 02/08/17 1045 Last data filed at 02/08/17 0910  Gross   per 24 hour  Intake              700 ml  Output             6401 ml  Net            -5701 ml   LBM: Last BM Date: 02/07/17 Baseline Weight: Weight: 74.8 kg (165 lb) Most recent weight: Weight: 73.1 kg (161 lb 2.5 oz)       Palliative Assessment/Data:    Flowsheet Rows     Most Recent Value  Intake Tab  Referral Department  Hospitalist  Unit at Time of Referral  Intermediate Care Unit  Palliative Care Primary Diagnosis  Sepsis/Infectious Disease [ETOH sepsis UTI weakness ]  Palliative Care Type  New Palliative care  Reason for referral  Clarify Goals of Care, Pain  Date first seen by Palliative Care  02/03/17  Clinical Assessment  Palliative Performance Scale Score  30%  Pain Max last 24 hours  6  Pain Min Last 24 hours  4  Dyspnea Max Last 24 Hours  3  Dyspnea Min Last 24 hours  2  Nausea Max Last 24 Hours  3  Nausea Min Last 24 Hours  2  Anxiety Max Last 24 Hours  4  Anxiety Min Last 24 Hours  3  Psychosocial & Spiritual Assessment  Palliative Care Outcomes  Patient/Family meeting held?  Yes  Who was at the meeting?  patient who is awake alert   Palliative Care Outcomes  Clarified goals of care      Patient Active Problem List   Diagnosis Date Noted  . Generalized pain   . Sepsis ([place]) 02/02/2017  . Hyponatremia 11/11/2016  . Urinary retention 11/11/2016  . Acute lower UTI 11/11/2016  . Acute renal failure with tubular necrosis ([place]) 03/25/2016  . Pulmonary nodule 03/24/2016  . Cirrhosis ([place]) 03/24/2016  . Acute kidney  injury ([place]) 03/24/2016  . Umbilical hernia, incarcerated 02/07/2016  . GERD (gastroesophageal reflux disease) 11/22/2015  . Depression 09/28/2015  . Protein-calorie malnutrition, severe 09/20/2015  . Chronic liver disease and cirrhosis ([place])   . AKI (acute kidney injury) ([place]) 09/16/2015  . Chronic respiratory failure ([place]) 09/06/2015  . Hypocalcemia 09/06/2015  . Diffuse abdominal pain 09/06/2015  . Ascites 09/05/2015  . SBP (spontaneous bacterial peritonitis) ([place]) 09/05/2015  . Protein-calorie malnutrition ([place]) 07/22/2015  . Encounter for palliative care   . Goals of care, counseling/discussion   . Abdominal discomfort   . Pancreatitis, acute 04/22/2015  . Hypokalemia 04/22/2015  . Hypomagnesemia 04/22/2015  . Decompensated hepatic cirrhosis ([place]) 04/21/2015  . Healthcare-associated pneumonia 04/21/2015  . Hyponatremia with excess extracellular fluid volume 04/21/2015  . Tobacco abuse 04/20/2015  . Alcoholic cirrhosis of liver with ascites ([place])   . SOB (shortness of breath)   . Alcohol abuse 03/12/2015  . Thrombocytopenia ([place]) 03/12/2015  . Hyperbilirubinemia 03/12/2015  . Alcohol dependence ([place]) 08/25/2013    Palliative Care Assessment & Plan   Patient Profile: 56 y.o.malewith medical history significant of alcoholic liver cirrhosis (s/p TIPS in 02/2016), protein calorie malnutrition, GERD, depression, BPH and urinary retention , COPD, medicalnoncompliance, tobacco abuse now unresponsive with sepsis related to PNA  Recommendations/Plan:  Discussed clinical situation at length with daughter and brother.  They understand severity of his situation.  Family would like to speak with Dr. Posey Pronto.  We discussed options of transition to full comfort care vs continuation of current therapies and reevaluating in 24 hours.  Code Status:  Code Status Orders        Start     Ordered   02/02/17 2248  Do not attempt resuscitation (DNR)  Continuous    Question Answer  Comment  In the event of cardiac or respiratory ARREST Do not call a "code blue"   In the event of cardiac or respiratory ARREST Do not perform Intubation, CPR, defibrillation or ACLS   In the event of cardiac or respiratory ARREST Use medication by any route, position, wound care, and other measures to relive pain and suffering. May use oxygen, suction and manual treatment of airway obstruction as needed for comfort.      02/02/17 2248    Code Status History    Date Active Date Inactive Code Status Order ID Comments User Context   11/11/2016  3:47 AM 11/12/2016  6:20 PM Full Code [id number]  Carter, Nikki, MD Inpatient   03/24/2016  8:33 PM 04/01/2016  6:44 PM Full Code [id number]  Black, Karen M, NP Inpatient   09/16/2015  9:13 PM 09/22/2015  2:52 PM Full Code [id number]  Gardner, Jared M, DO ED   09/05/2015  8:31 PM 09/10/2015  5:51 PM Full Code [id number]  Sullivan, James, MD ED   04/21/2015  4:42 AM 04/25/2015  3:53 PM Full Code [id number]  Smith, Rondell A, MD Inpatient   04/10/2015 10:34 PM 04/15/2015  5:19 PM Full Code [id number]  Hamad, Ahmad, MD ED   03/28/2015  6:23 AM 03/29/2015  4:24 PM Full Code [id number]  Patel, Pranav M, MD Inpatient   03/12/2015  3:59 PM 03/15/2015  8:08 PM Full Code [id number]  Chiu, Stephen K, MD ED   08/25/2013  4:16 PM 08/29/2013  5:32 PM Full Code [id number]  Nwoko, Agnes I, NP Inpatient   08/25/2013 12:36 AM 08/25/2013  4:16 PM Full Code [id number]  Ghim, Michael Y., MD ED       Prognosis:   guarded.  Discharge Planning:  To Be Determined. I believe there is a high likelihood that this will be a terminal admission.  Care plan was discussed with daughter, brother, Dr. Patel  Thank you for allowing the Palliative Medicine Team to assist in the care of this patient.   Time In: 1000 Time Out: 1040 Total Time 40 Prolonged Time Billed No      Greater than 50%  of this time was spent counseling and coordinating care related to the above assessment and plan.    , MD  Please contact Palliative Medicine Team phone at [phone] for questions and concerns.      

## 2017-02-08 NOTE — Progress Notes (Signed)
[facility] Progress Note  Patient: [name] ZOX:[id number]   PCP: [name], MD DOB: 08-28-1960   DOA: 02/02/2017   DOS: 02/08/2017   Date of Service: the patient was seen and examined on 02/08/2017  Subjective: patient stuporous , Occasionally opens eyes. Not following any command. No meaningful conversation.  Brief hospital course: Pt. with PMH of alcoholic liver cirrhosis, protein calorie malnutrition, as PTI PS, GERD, depression, BPH, COPD, noncompliance,active smoker; admitted on 02/02/2017, presented with complaint of fatigue, was found to have Sepsis HCAP and hepatic encephalopathy. Currently further plan is continue comfort care  Assessment and Plan: 1. Healthcare associated pneumonia. Sepsis. Sepsis due to UTI. Presented with fatigue. Developed shortness of breath as well as volume overload in the hospitalization requiring transfer to step down unit. Currently receiving aggressive diuresis with IV Lasix. Also started on IV vancomycin and cefepime. Later transitioned to Zosyn Not a candidate for intubation, DNR/DNI. On a candidate for NIV. Appreciate CCM input. Recommended comfort care. Discussed with family regarding goals of care currently transitioning to full comfort  2. Liver cirrhosis. Alcohol abuse. Ascites. hepatitic encephalopathy. Continuing lactulose. It appears that the patient may have responded to lactulose somewhat. Continue rifaximin. Continue IV Lasix. Prognosis is severely poor and at present would highly recommend patient to consider hospice with comfort care.  3. Goals of care discussion. Discussed with patient's daughter as well as patient's brother. As per his brother who has been primary caregiver for the patient, "patient would not like to see himself with tubes like this". They felt that the patient does not respond to medical treatment patient should be transitioned to full comfort. Based on this discussion wishes medical care was  transitioned for comfort. Monitor for next 24 hours. There is a possibility of patient lying in the hospital next 48 hours.  4. COPD exacerbation. On IV steroids. Currently transitioned to full comfort  5.hypokalemia. Hyponatremia. Hypocalcemia. Hypomagnesemia. On physical comfort  6. Thrombocytopenia. No active bleeding, on full comfort  Diet: NPO DVT Prophylaxis: Comfort care  Advance goals of care discussion: DNR DNI, comfort care  Family Communication: family was present at bedside, at the time of interview.  Disposition:  Discharge to be determined.  Consultants: PCCM, palliative care Procedures: none  Antibiotics: Anti-infectives    Start     Dose/Rate Route Frequency Ordered Stop   02/07/17 1100  rifaximin (XIFAXAN) tablet 400 mg  Status:  Discontinued     400 mg Oral 3 times daily 02/07/17 1012 02/08/17 1150   02/07/17 1000  piperacillin-tazobactam (ZOSYN) IVPB 3.375 g  Status:  Discontinued     3.375 g 12.5 mL/hr over 240 Minutes Intravenous Every 8 hours 02/07/17 0909 02/08/17 1150   02/07/17 0000  vancomycin (VANCOCIN) IVPB 1000 mg/200 mL premix  Status:  Discontinued     1,000 mg 200 mL/hr over 60 Minutes Intravenous Every 12 hours 02/06/17 1118 02/07/17 1835   02/06/17 1600  rifaximin (XIFAXAN) tablet 550 mg  Status:  Discontinued     550 mg Per Tube 3 times daily 02/06/17 1520 02/07/17 1011   02/06/17 1230  vancomycin (VANCOCIN) 1,250 mg in sodium chloride 0.9 % 250 mL IVPB     1,250 mg 166.7 mL/hr over 90 Minutes Intravenous  Once 02/06/17 1117 02/06/17 1419   02/06/17 1200  ceFEPIme (MAXIPIME) 1 g in dextrose 5 % 50 mL IVPB  Status:  Discontinued     1 g 100 mL/hr over 30 Minutes Intravenous Every 8 hours 02/06/17 1117 02/07/17 0901  02/04/17 1400  cefTRIAXone (ROCEPHIN) 1 g in dextrose 5 % 50 mL IVPB  Status:  Discontinued     1 g 100 mL/hr over 30 Minutes Intravenous Every 24 hours 02/04/17 1056 02/06/17 1117   02/03/17 0400  piperacillin-tazobactam  (ZOSYN) IVPB 3.375 g  Status:  Discontinued     3.375 g 12.5 mL/hr over 240 Minutes Intravenous Every 8 hours 02/02/17 2254 02/04/17 1043   02/02/17 2359  vancomycin (VANCOCIN) IVPB 750 mg/150 ml premix  Status:  Discontinued     750 mg 150 mL/hr over 60 Minutes Intravenous Every 8 hours 02/02/17 2254 02/04/17 1043   02/02/17 1915  cefTRIAXone (ROCEPHIN) 2 g in dextrose 5 % 50 mL IVPB  Status:  Discontinued     2 g 100 mL/hr over 30 Minutes Intravenous Every 24 hours 02/02/17 1905 02/02/17 2248       Objective: Physical Exam: Vitals:   02/08/17 0835 02/08/17 1200 02/08/17 1300 02/08/17 1603  BP:  112/88  108/63  Pulse:  92  88  Resp:  (!) 29  (!) 24  Temp:   97.8 F (36.6 C)   TempSrc:   Axillary   SpO2: 94% 90%  92%  Weight:      Height:        Intake/Output Summary (Last 24 hours) at 02/08/17 1935 Last data filed at 02/08/17 1855  Gross per 24 hour  Intake           879.17 ml  Output             5450 ml  Net         -4570.83 ml   Filed Weights   02/02/17 2131 02/03/17 0040 02/08/17 0542  Weight: 74.8 kg (165 lb) 78.8 kg (173 lb 11.6 oz) 73.1 kg (161 lb 2.5 oz)   General: Awake and DISOriented to Time, Place and Person. Appear in severe distress, Eyes: PERRL, Conjunctiva normal ENT: Oral Mucosa clear moist. Neck: NO JVD, no Abnormal Mass Or lumps Cardiovascular: S1 and S2 Present, no Murmur, Peripheral Pulses Present Respiratory: increased respiratory effort, Bilateral Air entry equal and Decreased, no use of accessory muscle, bilateralCrackles, no wheezes Abdomen: Bowel Sound present, Soft and no tenderness,  Skin: no redness, no Rash, no induration Extremities: no Pedal edema, no calf tenderness Neurologic: Grossly no focal neuro deficit. Bilaterally Equal motor strength  Data Reviewed: CBC:  Recent Labs Lab 02/02/17 1839 02/03/17 0827 02/05/17 0328 02/06/17 1229 02/08/17 0311  WBC 14.1* 12.2* 10.3 11.5* 16.7*  NEUTROABS 11.1*  --   --   --  14.2*  HGB  10.1* 8.8* 9.5* 10.4* 11.1*  HCT 27.2* 23.7* 26.4* 29.2* 31.8*  MCV 99.6 102.6* 102.7* 107.4* 105.6*  PLT 77* 71* 76* 70* 54*   Basic Metabolic Panel:  Recent Labs Lab 02/02/17 1839  02/03/17 0827  02/05/17 0328 02/06/17 0324 02/06/17 1520 02/07/17 0306 02/08/17 0311  NA 118*  < > 123*  < > 126* 130* 131* 132* 139  K 3.5  < > 3.2*  < > 3.2* 4.4 4.5 3.3* 2.1*  CL 89*  < > 97*  < > 100* 105 105 101 97*  CO2 18*  < > 18*  < > 20* 19* 20* 24 34*  GLUCOSE 83  < > 67  < > 176* 106* 111* 151* 124*  BUN 6  < > <5*  < > 6 9 14 19  25*  CREATININE 0.49*  < > 0.42*  < > 0.59* 0.63 0.78  0.62 0.78  CALCIUM 7.3*  < > 6.7*  < > 7.5* 7.7* 7.9* 7.5* 7.4*  MG 1.7  --  1.7  --   --  2.0 1.9 1.9 1.8  PHOS 3.0  --  2.5  --   --   --  3.2 4.1  --   < > = values in this interval not displayed.  Liver Function Tests:  Recent Labs Lab 02/03/17 0827 02/04/17 0333 02/05/17 0328 02/06/17 0324 02/08/17 0311  AST 130* 135* 97* 93* 141*  ALT 42 46 43 45 70*  ALKPHOS 95 104 92 104 77  BILITOT 15.8* 16.5* 16.5* 18.0* 25.4*  PROT 5.4* 5.7* 5.4* 5.6* 5.6*  ALBUMIN 1.3* 1.4* 1.3* 1.5* 1.6*    Recent Labs Lab 02/02/17 1839 02/03/17 0827  LIPASE 65* 48    Recent Labs Lab 02/02/17 2126 02/06/17 1143 02/08/17 0311  AMMONIA 85* 84* 50*   Coagulation Profile:  Recent Labs Lab 02/02/17 1839 02/03/17 0827 02/06/17 1520 02/08/17 0311  INR 2.81 3.46 4.03* 3.80   Cardiac Enzymes:  Recent Labs Lab 02/02/17 2235 02/03/17 0320 02/03/17 0827  TROPONINI <0.03 <0.03 <0.03   BNP (last 3 results) No results for input(s): PROBNP in the last 8760 hours. CBG: No results for input(s): GLUCAP in the last 168 hours. Studies: No results found.  Scheduled Meds: . mouth rinse  15 mL Mouth Rinse BID  . sodium chloride flush  3 mL Intravenous Q12H   Continuous Infusions: . sodium chloride 10 mL/hr at 02/07/17 0800  . sodium chloride     PRN Meds: sodium chloride, acetaminophen **OR**  acetaminophen, albuterol, glycopyrrolate **OR** glycopyrrolate **OR** glycopyrrolate, haloperidol **OR** haloperidol **OR** haloperidol lactate, LORazepam **OR** LORazepam **OR** LORazepam, LORazepam, LORazepam, morphine injection, ondansetron **OR** ondansetron (ZOFRAN) IV, sodium chloride flush  Time spent: 35 minutes  Author: Lynden Oxford, MD [facility] Pager: [phone] 02/08/2017 7:35 PM  If 7PM-7AM, please contact night-coverage at [email or web address], password [facility]

## 2017-02-08 NOTE — Plan of Care (Signed)
Problem: Nutrition: Goal: Adequate nutrition will be maintained Outcome: Not Progressing Pt lethargic , NPO, comfort care

## 2017-02-09 MED ORDER — FUROSEMIDE 10 MG/ML IJ SOLN
60.0000 mg | Freq: Two times a day (BID) | INTRAMUSCULAR | Status: DC
  Administered 2017-02-09: 60 mg via INTRAVENOUS
  Filled 2017-02-09: qty 6

## 2017-02-09 MED ORDER — MORPHINE SULFATE (PF) 4 MG/ML IV SOLN
2.0000 mg | INTRAVENOUS | Status: DC | PRN
  Administered 2017-02-11: 2 mg via INTRAVENOUS
  Filled 2017-02-09 (×2): qty 1

## 2017-02-09 NOTE — Progress Notes (Signed)
[facility] Progress Note  Patient: [name] ZOX:[id number]   PCP: [name], MD DOB: 1961/01/31   DOA: 02/02/2017   DOS: 02/09/2017   Date of Service: the patient was seen and examined on 02/09/2017  Subjective: Patient occasionally opens eyes. Not in any distress.  Brief hospital course: Pt. with PMH of alcoholic liver cirrhosis, protein calorie malnutrition, as PTI PS, GERD, depression, BPH, COPD, noncompliance,active smoker; admitted on 02/02/2017, presented with complaint of fatigue, was found to have Sepsis HCAP and hepatic encephalopathy. Currently further plan is continue comfort care  Assessment and Plan: 1. Healthcare associated pneumonia. Sepsis due to UTI. Acute hypoxic respiratory failure. Acute on chronic diastolic CHF. Presented with fatigue. Developed shortness of breath as well as volume overload in the hospitalization requiring transfer to step down unit. Currently receiving aggressive diuresis with IV Lasix. Also started on IV vancomycin and cefepime. Later transitioned to Zosyn Not a candidate for intubation, DNR/DNI. On a candidate for NIV. Appreciate CCM input. Recommended comfort care. Discussed with family regarding goals of care currently transitioning to full comfort  2. Liver cirrhosis. Alcohol abuse. Ascites. hepatitic encephalopathy. Continuing lactulose. It appears that the patient may have responded to lactulose somewhat. Continue rifaximin. Continue IV Lasix. Prognosis is severely poor and at present would highly recommend patient to consider hospice with comfort care.  3. Goals of care discussion. Discussed with patient's daughter as well as patient's brother. As per his brother who has been primary caregiver for the patient, "patient would not like to see himself with tubes like this". They felt that the patient does not respond to medical treatment patient should be transitioned to full comfort. Based on this discussion wishes medical care  was transitioned for comfort. Monitor for next 24 hours. There is a possibility of patient lying in the hospital next 48 hours.  4. COPD exacerbation. On IV steroids. Currently transitioned to full comfort  5.hypokalemia. Hyponatremia. Hypocalcemia. Hypomagnesemia. On physical comfort  6. Thrombocytopenia. No active bleeding, on full comfort  Diet: NPO DVT Prophylaxis: Comfort care  Advance goals of care discussion: DNR DNI, comfort care  Family Communication: no family was present at bedside, at the time of interview.  Disposition:  Discharge to be determined.  Consultants: PCCM, palliative care Procedures: none  Antibiotics: Anti-infectives    Start     Dose/Rate Route Frequency Ordered Stop   02/07/17 1100  rifaximin (XIFAXAN) tablet 400 mg  Status:  Discontinued     400 mg Oral 3 times daily 02/07/17 1012 02/08/17 1150   02/07/17 1000  piperacillin-tazobactam (ZOSYN) IVPB 3.375 g  Status:  Discontinued     3.375 g 12.5 mL/hr over 240 Minutes Intravenous Every 8 hours 02/07/17 0909 02/08/17 1150   02/07/17 0000  vancomycin (VANCOCIN) IVPB 1000 mg/200 mL premix  Status:  Discontinued     1,000 mg 200 mL/hr over 60 Minutes Intravenous Every 12 hours 02/06/17 1118 02/07/17 1835   02/06/17 1600  rifaximin (XIFAXAN) tablet 550 mg  Status:  Discontinued     550 mg Per Tube 3 times daily 02/06/17 1520 02/07/17 1011   02/06/17 1230  vancomycin (VANCOCIN) 1,250 mg in sodium chloride 0.9 % 250 mL IVPB     1,250 mg 166.7 mL/hr over 90 Minutes Intravenous  Once 02/06/17 1117 02/06/17 1419   02/06/17 1200  ceFEPIme (MAXIPIME) 1 g in dextrose 5 % 50 mL IVPB  Status:  Discontinued     1 g 100 mL/hr over 30 Minutes Intravenous Every 8 hours 02/06/17  1117 02/07/17 0901   02/04/17 1400  cefTRIAXone (ROCEPHIN) 1 g in dextrose 5 % 50 mL IVPB  Status:  Discontinued     1 g 100 mL/hr over 30 Minutes Intravenous Every 24 hours 02/04/17 1056 02/06/17 1117   02/03/17 0400   piperacillin-tazobactam (ZOSYN) IVPB 3.375 g  Status:  Discontinued     3.375 g 12.5 mL/hr over 240 Minutes Intravenous Every 8 hours 02/02/17 2254 02/04/17 1043   02/02/17 2359  vancomycin (VANCOCIN) IVPB 750 mg/150 ml premix  Status:  Discontinued     750 mg 150 mL/hr over 60 Minutes Intravenous Every 8 hours 02/02/17 2254 02/04/17 1043   02/02/17 1915  cefTRIAXone (ROCEPHIN) 2 g in dextrose 5 % 50 mL IVPB  Status:  Discontinued     2 g 100 mL/hr over 30 Minutes Intravenous Every 24 hours 02/02/17 1905 02/02/17 2248       Objective: Physical Exam: Vitals:   02/08/17 2009 02/09/17 0514 02/09/17 1500 02/09/17 1506  BP: 106/68 120/85 131/74   Pulse: 82 84 (!) 104 99  Resp: (!) 24 20 (!) 21 18  Temp: 97.8 F (36.6 C) 98.1 F (36.7 C) (!) 97.5 F (36.4 C)   TempSrc: Axillary Axillary Axillary   SpO2: 100% 94% (!) 71% 94%  Weight: 69.3 kg (152 lb 12.5 oz)     Height: 6' (1.829 m)       Intake/Output Summary (Last 24 hours) at 02/09/17 1704 Last data filed at 02/09/17 0519  Gross per 24 hour  Intake              100 ml  Output             1000 ml  Net             -900 ml   Filed Weights   02/03/17 0040 02/08/17 0542 02/08/17 2009  Weight: 78.8 kg (173 lb 11.6 oz) 73.1 kg (161 lb 2.5 oz) 69.3 kg (152 lb 12.5 oz)   General: Awake and DISOriented to Time, Place and Person. Appear in severe distress, Eyes: PERRL, Conjunctiva normal ENT: Oral Mucosa clear moist. Neck: NO JVD, no Abnormal Mass Or lumps Cardiovascular: S1 and S2 Present, no Murmur, Peripheral Pulses Present Respiratory: increased respiratory effort, Bilateral Air entry equal and Decreased, no use of accessory muscle, bilateralCrackles, no wheezes Abdomen: Bowel Sound present, Soft and no tenderness,  Skin: no redness, no Rash, no induration Extremities: no Pedal edema, no calf tenderness Neurologic: Grossly no focal neuro deficit. Bilaterally Equal motor strength  Data Reviewed: CBC:  Recent Labs Lab  02/02/17 1839 02/03/17 0827 02/05/17 0328 02/06/17 1229 02/08/17 0311  WBC 14.1* 12.2* 10.3 11.5* 16.7*  NEUTROABS 11.1*  --   --   --  14.2*  HGB 10.1* 8.8* 9.5* 10.4* 11.1*  HCT 27.2* 23.7* 26.4* 29.2* 31.8*  MCV 99.6 102.6* 102.7* 107.4* 105.6*  PLT 77* 71* 76* 70* 54*   Basic Metabolic Panel:  Recent Labs Lab 02/02/17 1839  02/03/17 0827  02/05/17 0328 02/06/17 0324 02/06/17 1520 02/07/17 0306 02/08/17 0311  NA 118*  < > 123*  < > 126* 130* 131* 132* 139  K 3.5  < > 3.2*  < > 3.2* 4.4 4.5 3.3* 2.1*  CL 89*  < > 97*  < > 100* 105 105 101 97*  CO2 18*  < > 18*  < > 20* 19* 20* 24 34*  GLUCOSE 83  < > 67  < > 176* 106* 111*  151* 124*  BUN 6  < > <5*  < > 25*  CREATININE 0.49*  < > 0.42*  < > 0.59* 0.63 0.78 0.62 0.78  CALCIUM 7.3*  < > 6.7*  < > 7.5* 7.7* 7.9* 7.5* 7.4*  MG 1.7  --  1.7  --   --  2.0 1.9 1.9 1.8  PHOS 3.0  --  2.5  --   --   --  3.2 4.1  --   < > = values in this interval not displayed.  Liver Function Tests:  Recent Labs Lab 02/03/17 0827 02/04/17 0333 02/05/17 0328 02/06/17 0324 02/08/17 0311  AST 130* 135* 97* 93* 141*  ALT 42 46 43 45 70*  ALKPHOS 95 104 92 104 77  BILITOT 15.8* 16.5* 16.5* 18.0* 25.4*  PROT 5.4* 5.7* 5.4* 5.6* 5.6*  ALBUMIN 1.3* 1.4* 1.3* 1.5* 1.6*    Recent Labs Lab 02/02/17 1839 02/03/17 0827  LIPASE 65* 48    Recent Labs Lab 02/02/17 2126 02/06/17 1143 02/08/17 0311  AMMONIA 85* 84* 50*   Coagulation Profile:  Recent Labs Lab 02/02/17 1839 02/03/17 0827 02/06/17 1520 02/08/17 0311  INR 2.81 3.46 4.03* 3.80   Cardiac Enzymes:  Recent Labs Lab 02/02/17 2235 02/03/17 0320 02/03/17 0827  TROPONINI <0.03 <0.03 <0.03   BNP (last 3 results) No results for input(s): PROBNP in the last 8760 hours. CBG: No results for input(s): GLUCAP in the last 168 hours. Studies: No results found.  Scheduled Meds: . furosemide  60 mg Intravenous Q12H  . mouth rinse  15 mL Mouth Rinse BID  .  sodium chloride flush  3 mL Intravenous Q12H   Continuous Infusions: . sodium chloride 10 mL/hr at 02/07/17 0800  . sodium chloride     PRN Meds: sodium chloride, acetaminophen **OR** acetaminophen, albuterol, glycopyrrolate **OR** glycopyrrolate **OR** glycopyrrolate, haloperidol **OR** haloperidol **OR** haloperidol lactate, LORazepam **OR** LORazepam **OR** LORazepam, LORazepam, LORazepam, morphine injection, ondansetron **OR** ondansetron (ZOFRAN) IV, sodium chloride flush  Time spent: 35 minutes  Author: Lynden Oxford, MD [facility] Pager: [phone] 02/09/2017 5:04 PM  If 7PM-7AM, please contact night-coverage at [email or web address], password [facility]

## 2017-02-09 NOTE — Progress Notes (Signed)
Daily Progress Note   Patient Name: [name]       Date: 02/09/2017 DOB: Oct 06, 1960  Age: 56 y.o. MRN#: [id number] Attending Physician: Rolly Salter, MD Primary Care Physician: [name], MD Admit Date: 02/02/2017  Reason for Consultation/Follow-up: Establishing goals of care, Non pain symptom management, Pain control and Terminal Care  Subjective:  Saw and examined patient today. See below:  Length of Stay: 7  Current Medications: Scheduled Meds:  . mouth rinse  15 mL Mouth Rinse BID  . sodium chloride flush  3 mL Intravenous Q12H    Continuous Infusions: . sodium chloride 10 mL/hr at 02/07/17 0800  . sodium chloride      PRN Meds: sodium chloride, acetaminophen **OR** acetaminophen, albuterol, glycopyrrolate **OR** glycopyrrolate **OR** glycopyrrolate, haloperidol **OR** haloperidol **OR** haloperidol lactate, LORazepam **OR** LORazepam **OR** LORazepam, LORazepam, LORazepam, morphine injection, ondansetron **OR** ondansetron (ZOFRAN) IV, sodium chloride flush  Physical Exam    General: Somnolent, ill appearing, in no acute distress Heart: Regular rate and rhythm. No murmur appreciated. Lungs: Diminished air movement, scattered crackles Abdomen: Soft, nontender, nondistended, positive bowel sounds.  Ext: No significant edema Skin: Warm and dry       Vital Signs: BP 131/74 (BP Location: Right Arm)   Pulse 99   Temp (!) 97.5 F (36.4 C) (Axillary)   Resp 18   Ht 6' (1.829 m)   Wt 69.3 kg (152 lb 12.5 oz)   SpO2 94%   BMI 20.72 kg/m  SpO2: SpO2: 94 % O2 Device: O2 Device: Nasal Cannula O2 Flow Rate: O2 Flow Rate (L/min): 5 L/min  Intake/output summary:   Intake/Output Summary (Last 24 hours) at 02/09/17 2143 Last data filed at 02/09/17 1800  Gross per  24 hour  Intake            70.83 ml  Output             1650 ml  Net         -1579.17 ml   LBM: Last BM Date: 02/08/17 Baseline Weight: Weight: 74.8 kg (165 lb) Most recent weight: Weight: 69.3 kg (152 lb 12.5 oz)       Palliative Assessment/Data:    Flowsheet Rows     Most Recent Value  Intake Tab  Referral Department  Hospitalist  Unit at Time of  Referral  Intermediate Care Unit  Palliative Care Primary Diagnosis  Sepsis/Infectious Disease [ETOH sepsis UTI weakness ]  Palliative Care Type  New Palliative care  Reason for referral  Clarify Goals of Care, Pain  Date first seen by Palliative Care  02/03/17  Clinical Assessment  Palliative Performance Scale Score  30%  Pain Max last 24 hours  6  Pain Min Last 24 hours  4  Dyspnea Max Last 24 Hours  3  Dyspnea Min Last 24 hours  2  Nausea Max Last 24 Hours  3  Nausea Min Last 24 Hours  2  Anxiety Max Last 24 Hours  4  Anxiety Min Last 24 Hours  3  Psychosocial & Spiritual Assessment  Palliative Care Outcomes  Patient/Family meeting held?  Yes  Who was at the meeting?  patient who is awake alert   Palliative Care Outcomes  Clarified goals of care      Patient Active Problem List   Diagnosis Date Noted  . Generalized pain   . Sepsis (HCC) 02/02/2017  . Hyponatremia 11/11/2016  . Urinary retention 11/11/2016  . Acute lower UTI 11/11/2016  . Acute renal failure with tubular necrosis (HCC) 03/25/2016  . Pulmonary nodule 03/24/2016  . Cirrhosis (HCC) 03/24/2016  . Acute kidney injury (HCC) 03/24/2016  . Umbilical hernia, incarcerated 02/07/2016  . GERD (gastroesophageal reflux disease) 11/22/2015  . Depression 09/28/2015  . Protein-calorie malnutrition, severe 09/20/2015  . Chronic liver disease and cirrhosis (HCC)   . AKI (acute kidney injury) (HCC) 09/16/2015  . Chronic respiratory failure (HCC) 09/06/2015  . Hypocalcemia 09/06/2015  . Diffuse abdominal pain 09/06/2015  . Ascites 09/05/2015  . SBP (spontaneous  bacterial peritonitis) (HCC) 09/05/2015  . Protein-calorie malnutrition (HCC) 07/22/2015  . Encounter for palliative care   . Goals of care, counseling/discussion   . Abdominal discomfort   . Pancreatitis, acute 04/22/2015  . Hypokalemia 04/22/2015  . Hypomagnesemia 04/22/2015  . Decompensated hepatic cirrhosis (HCC) 04/21/2015  . Healthcare-associated pneumonia 04/21/2015  . Hyponatremia with excess extracellular fluid volume 04/21/2015  . Tobacco abuse 04/20/2015  . Alcoholic cirrhosis of liver with ascites (HCC)   . SOB (shortness of breath)   . Alcohol abuse 03/12/2015  . Thrombocytopenia (HCC) 03/12/2015  . Hyperbilirubinemia 03/12/2015  . Alcohol dependence (HCC) 08/25/2013    Palliative Care Assessment & Plan   Patient Profile: 56 y.o.malewith medical history significant of alcoholic liver cirrhosis (s/p TIPS in 02/2016), protein calorie malnutrition, GERD, depression, BPH and urinary retention , COPD, medicalnoncompliance, tobacco abuse now unresponsive with sepsis related to PNA  Recommendations/Plan:  Full comfort care.  Somewhat restless on exam but appears comfortable overall.  Encouraged RN to try haldol with agitation.    Code Status:    Code Status Orders        Start     Ordered   02/02/17 2248  Do not attempt resuscitation (DNR)  Continuous    Question Answer Comment  In the event of cardiac or respiratory ARREST Do not call a "code blue"   In the event of cardiac or respiratory ARREST Do not perform Intubation, CPR, defibrillation or ACLS   In the event of cardiac or respiratory ARREST Use medication by any route, position, wound care, and other measures to relive pain and suffering. May use oxygen, suction and manual treatment of airway obstruction as needed for comfort.      02/02/17 2248    Code Status History    Date Active Date Inactive Code Status Order  ID Comments User Context   11/11/2016  3:47 AM 11/12/2016  6:20 PM Full Code [id number]   Michael Litter, MD Inpatient   03/24/2016  8:33 PM 04/01/2016  6:44 PM Full Code [id number]  Gwenyth Bender, NP Inpatient   09/16/2015  9:13 PM 09/22/2015  2:52 PM Full Code [id number]  Hillary Bow, DO ED   09/05/2015  8:31 PM 09/10/2015  5:51 PM Full Code [id number]  Joella Prince, MD ED   04/21/2015  4:42 AM 04/25/2015  3:53 PM Full Code [id number]  Clydie Braun, MD Inpatient   04/10/2015 10:34 PM 04/15/2015  5:19 PM Full Code [id number]  Eston Esters, MD ED   03/28/2015  6:23 AM 03/29/2015  4:24 PM Full Code [id number]  Rolly Salter, MD Inpatient   03/12/2015  3:59 PM 03/15/2015  8:08 PM Full Code [id number]  Jerald Kief, MD ED   08/25/2013  4:16 PM 08/29/2013  5:32 PM Full Code [id number]  Sanjuana Kava, NP Inpatient   08/25/2013 12:36 AM 08/25/2013  4:16 PM Full Code [id number]  Lear Ng., MD ED       Prognosis:   Days to weeks  Discharge Planning:  To Be Determined. I believe there is a high likelihood that this will be a terminal admission.  Care plan was discussed with Dr. Allena Katz  Thank you for allowing the Palliative Medicine Team to assist in the care of this patient.   Total Time 20 Prolonged Time Billed No      Greater than 50%  of this time was spent counseling and coordinating care related to the above assessment and plan.  Romie Minus, MD  Please contact Palliative Medicine Team phone at [phone] for questions and concerns.

## 2017-02-09 NOTE — Progress Notes (Signed)
PT Cancellation Note  Patient Details Name: [name] MRN: [id number] DOB: 1960/08/20   Cancelled Treatment:    Reason Eval/Treat Not Completed: Medical issues which prohibited therapy RN reports pt not appropriate for therapy today.  Per palliative care notes, "high likelihood that this will be a terminal admission"   Appears plan is for observing next 24 hours and possible transition to full comfort care.  Will check back Monday if appropriate.  Please discontinue physical therapy order if PT needs are no longer beneficial or desired.  Thank you!   Katiana Ruland,KATHrine E 02/09/2017, 11:15 AM Zenovia Jarred, PT, DPT 02/09/2017 Pager: [phone]

## 2017-02-10 MED ORDER — HALOPERIDOL LACTATE 5 MG/ML IJ SOLN
2.0000 mg | Freq: Once | INTRAMUSCULAR | Status: AC
  Administered 2017-02-10: 2 mg via INTRAVENOUS
  Filled 2017-02-10: qty 1

## 2017-02-10 MED ORDER — HALOPERIDOL LACTATE 5 MG/ML IJ SOLN
2.0000 mg | Freq: Four times a day (QID) | INTRAMUSCULAR | Status: DC | PRN
  Filled 2017-02-10: qty 1

## 2017-02-10 NOTE — Progress Notes (Signed)
[facility] Progress Note  Patient: [name] ZOX:[id number]   PCP: [name], MD DOB: 1960-08-27   DOA: 02/02/2017   DOS: 02/10/2017   Date of Service: the patient was seen and examined on 02/10/2017  Subjective: Patient Opened his eyes, was agitated. Mild respiratory distress. No nausea no vomiting.  Brief hospital course: Pt. with PMH of alcoholic liver cirrhosis, protein calorie malnutrition, as PTI PS, GERD, depression, BPH, COPD, noncompliance,active smoker; admitted on 02/02/2017, presented with complaint of fatigue, was found to have Sepsis HCAP and hepatic encephalopathy. Currently further plan is continue comfort care  Assessment and Plan: 1. Healthcare associated pneumonia. Sepsis due to UTI. Acute hypoxic respiratory failure. Acute on chronic diastolic CHF. Presented with fatigue. Developed shortness of breath as well as volume overload in the hospitalization requiring transfer to step down unit. Currently receiving aggressive diuresis with IV Lasix. Also started on IV vancomycin and cefepime. Later transitioned to Zosyn Not a candidate for intubation, DNR/DNI. On a candidate for NIV. Appreciate CCM input. Recommended comfort care. Discussed with family regarding goals of care currently transitioning to full comfort  2. Liver cirrhosis. Alcohol abuse. Ascites. hepatitic encephalopathy. Continuing lactulose. It appears that the patient may have responded to lactulose somewhat. Continue rifaximin. Continue IV Lasix. Prognosis is severely poor and at present would highly recommend patient to consider hospice with comfort care.  3. Goals of care discussion. Discussed with patient's daughter as well as patient's brother. As per his brother who has been primary caregiver for the patient, "patient would not like to see himself with tubes like this". They felt that the patient does not respond to medical treatment patient should be transitioned to full comfort. Based on  this discussion wishes medical care was transitioned for comfort. Case manager consulted for inpatient hospice transfer.  4. COPD exacerbation. On IV steroids. Currently transitioned to full comfort  5.hypokalemia. Hyponatremia. Hypocalcemia. Hypomagnesemia. On physical comfort  6. Thrombocytopenia. No active bleeding, on full comfort  Diet: NPO DVT Prophylaxis: Comfort care  Advance goals of care discussion: DNR DNI, comfort care  Family Communication: no family was present at bedside, at the time of interview.  Disposition:  Discharge to be determined.  Consultants: PCCM, palliative care Procedures: none  Antibiotics: Anti-infectives    Start     Dose/Rate Route Frequency Ordered Stop   02/07/17 1100  rifaximin (XIFAXAN) tablet 400 mg  Status:  Discontinued     400 mg Oral 3 times daily 02/07/17 1012 02/08/17 1150   02/07/17 1000  piperacillin-tazobactam (ZOSYN) IVPB 3.375 g  Status:  Discontinued     3.375 g 12.5 mL/hr over 240 Minutes Intravenous Every 8 hours 02/07/17 0909 02/08/17 1150   02/07/17 0000  vancomycin (VANCOCIN) IVPB 1000 mg/200 mL premix  Status:  Discontinued     1,000 mg 200 mL/hr over 60 Minutes Intravenous Every 12 hours 02/06/17 1118 02/07/17 1835   02/06/17 1600  rifaximin (XIFAXAN) tablet 550 mg  Status:  Discontinued     550 mg Per Tube 3 times daily 02/06/17 1520 02/07/17 1011   02/06/17 1230  vancomycin (VANCOCIN) 1,250 mg in sodium chloride 0.9 % 250 mL IVPB     1,250 mg 166.7 mL/hr over 90 Minutes Intravenous  Once 02/06/17 1117 02/06/17 1419   02/06/17 1200  ceFEPIme (MAXIPIME) 1 g in dextrose 5 % 50 mL IVPB  Status:  Discontinued     1 g 100 mL/hr over 30 Minutes Intravenous Every 8 hours 02/06/17 1117 02/07/17 0901   02/04/17  1400  cefTRIAXone (ROCEPHIN) 1 g in dextrose 5 % 50 mL IVPB  Status:  Discontinued     1 g 100 mL/hr over 30 Minutes Intravenous Every 24 hours 02/04/17 1056 02/06/17 1117   02/03/17 0400  piperacillin-tazobactam  (ZOSYN) IVPB 3.375 g  Status:  Discontinued     3.375 g 12.5 mL/hr over 240 Minutes Intravenous Every 8 hours 02/02/17 2254 02/04/17 1043   02/02/17 2359  vancomycin (VANCOCIN) IVPB 750 mg/150 ml premix  Status:  Discontinued     750 mg 150 mL/hr over 60 Minutes Intravenous Every 8 hours 02/02/17 2254 02/04/17 1043   02/02/17 1915  cefTRIAXone (ROCEPHIN) 2 g in dextrose 5 % 50 mL IVPB  Status:  Discontinued     2 g 100 mL/hr over 30 Minutes Intravenous Every 24 hours 02/02/17 1905 02/02/17 2248       Objective: Physical Exam: Vitals:   02/09/17 1500 02/09/17 1506 02/10/17 0019 02/10/17 0537  BP: 131/74  105/65 126/74  Pulse: (!) 104 99 100 (!) 107  Resp: (!) Temp: (!) 97.5 F (36.4 C)  97.6 F (36.4 C) 98.1 F (36.7 C)  TempSrc: Axillary  Axillary Axillary  SpO2: (!) 71% 94% 94% 97%  Weight:      Height:        Intake/Output Summary (Last 24 hours) at 02/10/17 1704 Last data filed at 02/10/17 0600  Gross per 24 hour  Intake              280 ml  Output             2200 ml  Net            -1920 ml   Filed Weights   02/03/17 0040 02/08/17 0542 02/08/17 2009  Weight: 78.8 kg (173 lb 11.6 oz) 73.1 kg (161 lb 2.5 oz) 69.3 kg (152 lb 12.5 oz)   General: Awake and DISOriented to Time, Place and Person. Appear in severe distress, Eyes: PERRL, Conjunctiva normal ENT: Oral Mucosa clear moist. Neck: NO JVD, no Abnormal Mass Or lumps Cardiovascular: S1 and S2 Present, no Murmur, Peripheral Pulses Present Respiratory: increased respiratory effort, Bilateral Air entry equal and Decreased, no use of accessory muscle, bilateralCrackles, no wheezes Abdomen: Bowel Sound present, Soft and no tenderness,  Skin: no redness, no Rash, no induration Extremities: no Pedal edema, no calf tenderness Neurologic: Grossly no focal neuro deficit. Bilaterally Equal motor strength  Data Reviewed: CBC:  Recent Labs Lab 02/05/17 0328 02/06/17 1229 02/08/17 0311  WBC 10.3 11.5*  16.7*  NEUTROABS  --   --  14.2*  HGB 9.5* 10.4* 11.1*  HCT 26.4* 29.2* 31.8*  MCV 102.7* 107.4* 105.6*  PLT 76* 70* 54*   Basic Metabolic Panel:  Recent Labs Lab 02/05/17 0328 02/06/17 0324 02/06/17 1520 02/07/17 0306 02/08/17 0311  NA 126* 130* 131* 132* 139  K 3.2* 4.4 4.5 3.3* 2.1*  CL 100* 105 105 101 97*  CO2 20* 19* 20* 24 34*  GLUCOSE 176* 106* 111* 151* 124*  BUN 25*  CREATININE 0.59* 0.63 0.78 0.62 0.78  CALCIUM 7.5* 7.7* 7.9* 7.5* 7.4*  MG  --  2.0 1.9 1.9 1.8  PHOS  --   --  3.2 4.1  --     Liver Function Tests:  Recent Labs Lab 02/04/17 0333 02/05/17 0328 02/06/17 0324 02/08/17 0311  AST 135* 97* 93* 141*  ALT 46 43 45 70*  ALKPHOS 104  92 104 77  BILITOT 16.5* 16.5* 18.0* 25.4*  PROT 5.7* 5.4* 5.6* 5.6*  ALBUMIN 1.4* 1.3* 1.5* 1.6*   No results for input(s): LIPASE, AMYLASE in the last 168 hours.  Recent Labs Lab 02/06/17 1143 02/08/17 0311  AMMONIA 84* 50*   Coagulation Profile:  Recent Labs Lab 02/06/17 1520 02/08/17 0311  INR 4.03* 3.80   Cardiac Enzymes: No results for input(s): CKTOTAL, CKMB, CKMBINDEX, TROPONINI in the last 168 hours. BNP (last 3 results) No results for input(s): PROBNP in the last 8760 hours. CBG: No results for input(s): GLUCAP in the last 168 hours. Studies: No results found.  Scheduled Meds: . mouth rinse  15 mL Mouth Rinse BID  . sodium chloride flush  3 mL Intravenous Q12H   Continuous Infusions: . sodium chloride 10 mL/hr at 02/07/17 0800  . sodium chloride     PRN Meds: sodium chloride, acetaminophen **OR** acetaminophen, albuterol, glycopyrrolate **OR** glycopyrrolate **OR** glycopyrrolate, haloperidol **OR** haloperidol **OR** [DISCONTINUED] haloperidol lactate, haloperidol lactate, LORazepam **OR** LORazepam **OR** LORazepam, LORazepam, LORazepam, morphine injection, ondansetron **OR** ondansetron (ZOFRAN) IV, sodium chloride flush  Time spent: 35 minutes  Author: Lynden Oxford,  MD [facility] Pager: [phone] 02/10/2017 5:04 PM  If 7PM-7AM, please contact night-coverage at [email or web address], password [facility]

## 2017-02-10 NOTE — Progress Notes (Signed)
Daily Progress Note   Patient Name: [name]       Date: 02/10/2017 DOB: 1960/06/05  Age: 56 y.o. MRN#: [id number] Attending Physician: Rolly Salter, MD Primary Care Physician: [name], MD Admit Date: 02/02/2017  Reason for Consultation/Follow-up: Establishing goals of care, Non pain symptom management, Pain control and Terminal Care  Subjective: Saw and examined patient today.  He was unresponsive but appears comfortable at time of exam.  See below:  Length of Stay: 8  Current Medications: Scheduled Meds:  . mouth rinse  15 mL Mouth Rinse BID  . sodium chloride flush  3 mL Intravenous Q12H    Continuous Infusions: . sodium chloride 10 mL/hr at 02/07/17 0800  . sodium chloride      PRN Meds: sodium chloride, acetaminophen **OR** acetaminophen, albuterol, glycopyrrolate **OR** glycopyrrolate **OR** glycopyrrolate, haloperidol **OR** haloperidol **OR** [DISCONTINUED] haloperidol lactate, haloperidol lactate, LORazepam **OR** LORazepam **OR** LORazepam, LORazepam, LORazepam, morphine injection, ondansetron **OR** ondansetron (ZOFRAN) IV, sodium chloride flush  Physical Exam    General: Somnolent, ill appearing, in no acute distress Heart: Regular rate and rhythm. No murmur appreciated. Lungs: Diminished air movement, scattered crackles Abdomen: Soft, nontender, nondistended, positive bowel sounds.  Ext: No significant edema Skin: Warm and dry       Vital Signs: BP 126/74 (BP Location: Left Arm)   Pulse (!) 107   Temp 98.1 F (36.7 C) (Axillary)   Resp 18   Ht 6' (1.829 m)   Wt 69.3 kg (152 lb 12.5 oz)   SpO2 97%   BMI 20.72 kg/m  SpO2: SpO2: 97 % O2 Device: O2 Device: Nasal Cannula O2 Flow Rate: O2 Flow Rate (L/min): 5 L/min  Intake/output summary:    Intake/Output Summary (Last 24 hours) at 02/10/17 1213 Last data filed at 02/10/17 0600  Gross per 24 hour  Intake              280 ml  Output             2200 ml  Net            -1920 ml   LBM: Last BM Date: 02/08/17 Baseline Weight: Weight: 74.8 kg (165 lb) Most recent weight: Weight: 69.3 kg (152 lb 12.5 oz)       Palliative Assessment/Data:    Flowsheet Rows  Most Recent Value  Intake Tab  Referral Department  Hospitalist  Unit at Time of Referral  Intermediate Care Unit  Palliative Care Primary Diagnosis  Sepsis/Infectious Disease [ETOH sepsis UTI weakness ]  Palliative Care Type  New Palliative care  Reason for referral  Clarify Goals of Care, Pain  Date first seen by Palliative Care  02/03/17  Clinical Assessment  Palliative Performance Scale Score  30%  Pain Max last 24 hours  6  Pain Min Last 24 hours  4  Dyspnea Max Last 24 Hours  3  Dyspnea Min Last 24 hours  2  Nausea Max Last 24 Hours  3  Nausea Min Last 24 Hours  2  Anxiety Max Last 24 Hours  4  Anxiety Min Last 24 Hours  3  Psychosocial & Spiritual Assessment  Palliative Care Outcomes  Patient/Family meeting held?  Yes  Who was at the meeting?  patient who is awake alert   Palliative Care Outcomes  Clarified goals of care      Patient Active Problem List   Diagnosis Date Noted  . Generalized pain   . Sepsis (HCC) 02/02/2017  . Hyponatremia 11/11/2016  . Urinary retention 11/11/2016  . Acute lower UTI 11/11/2016  . Acute renal failure with tubular necrosis (HCC) 03/25/2016  . Pulmonary nodule 03/24/2016  . Cirrhosis (HCC) 03/24/2016  . Acute kidney injury (HCC) 03/24/2016  . Umbilical hernia, incarcerated 02/07/2016  . GERD (gastroesophageal reflux disease) 11/22/2015  . Depression 09/28/2015  . Protein-calorie malnutrition, severe 09/20/2015  . Chronic liver disease and cirrhosis (HCC)   . AKI (acute kidney injury) (HCC) 09/16/2015  . Chronic respiratory failure (HCC) 09/06/2015  .  Hypocalcemia 09/06/2015  . Diffuse abdominal pain 09/06/2015  . Ascites 09/05/2015  . SBP (spontaneous bacterial peritonitis) (HCC) 09/05/2015  . Protein-calorie malnutrition (HCC) 07/22/2015  . Encounter for palliative care   . Goals of care, counseling/discussion   . Abdominal discomfort   . Pancreatitis, acute 04/22/2015  . Hypokalemia 04/22/2015  . Hypomagnesemia 04/22/2015  . Decompensated hepatic cirrhosis (HCC) 04/21/2015  . Healthcare-associated pneumonia 04/21/2015  . Hyponatremia with excess extracellular fluid volume 04/21/2015  . Tobacco abuse 04/20/2015  . Alcoholic cirrhosis of liver with ascites (HCC)   . SOB (shortness of breath)   . Alcohol abuse 03/12/2015  . Thrombocytopenia (HCC) 03/12/2015  . Hyperbilirubinemia 03/12/2015  . Alcohol dependence (HCC) 08/25/2013    Palliative Care Assessment & Plan   Patient Profile: 56 y.o.malewith medical history significant of alcoholic liver cirrhosis (s/p TIPS in 02/2016), protein calorie malnutrition, GERD, depression, BPH and urinary retention , COPD, medicalnoncompliance, tobacco abuse now unresponsive with sepsis related to PNA  Recommendations/Plan:  Full comfort care.  Less restless and not responsive on exam this AM.    Code Status:    Code Status Orders        Start     Ordered   02/02/17 2248  Do not attempt resuscitation (DNR)  Continuous    Question Answer Comment  In the event of cardiac or respiratory ARREST Do not call a "code blue"   In the event of cardiac or respiratory ARREST Do not perform Intubation, CPR, defibrillation or ACLS   In the event of cardiac or respiratory ARREST Use medication by any route, position, wound care, and other measures to relive pain and suffering. May use oxygen, suction and manual treatment of airway obstruction as needed for comfort.      02/02/17 2248    Code Status History  Date Active Date Inactive Code Status Order ID Comments User Context    11/11/2016  3:47 AM 11/12/2016  6:20 PM Full Code [id number]  Michael Litter, MD Inpatient   03/24/2016  8:33 PM 04/01/2016  6:44 PM Full Code [id number]  Gwenyth Bender, NP Inpatient   09/16/2015  9:13 PM 09/22/2015  2:52 PM Full Code [id number]  Hillary Bow, DO ED   09/05/2015  8:31 PM 09/10/2015  5:51 PM Full Code [id number]  Joella Prince, MD ED   04/21/2015  4:42 AM 04/25/2015  3:53 PM Full Code [id number]  Clydie Braun, MD Inpatient   04/10/2015 10:34 PM 04/15/2015  5:19 PM Full Code [id number]  Eston Esters, MD ED   03/28/2015  6:23 AM 03/29/2015  4:24 PM Full Code [id number]  Rolly Salter, MD Inpatient   03/12/2015  3:59 PM 03/15/2015  8:08 PM Full Code [id number]  Jerald Kief, MD ED   08/25/2013  4:16 PM 08/29/2013  5:32 PM Full Code [id number]  Sanjuana Kava, NP Inpatient   08/25/2013 12:36 AM 08/25/2013  4:16 PM Full Code [id number]  Lear Ng., MD ED       Prognosis:  Days to > 2 weeks  Discharge Planning: I believe there is a high likelihood that this will be a terminal admission.  Thank you for allowing the Palliative Medicine Team to assist in the care of this patient.   Total Time 20 Prolonged Time Billed No      Greater than 50%  of this time was spent counseling and coordinating care related to the above assessment and plan.  Romie Minus, MD  Please contact Palliative Medicine Team phone at [phone] for questions and concerns.

## 2017-02-11 LAB — CULTURE, BLOOD (ROUTINE X 2)
Culture: NO GROWTH
Culture: NO GROWTH
Special Requests: ADEQUATE

## 2017-02-11 MED ORDER — HALOPERIDOL LACTATE 5 MG/ML IJ SOLN: 2.0000 mg | INTRAMUSCULAR | Status: DC | PRN

## 2017-02-11 MED ORDER — HALOPERIDOL LACTATE 5 MG/ML IJ SOLN
2.0000 mg | Freq: Four times a day (QID) | INTRAMUSCULAR | Status: DC
  Administered 2017-02-11 – 2017-02-12 (×6): 2 mg via INTRAVENOUS
  Filled 2017-02-11 (×5): qty 1

## 2017-02-11 MED ORDER — MORPHINE SULFATE (PF) 4 MG/ML IV SOLN
2.0000 mg | INTRAVENOUS | Status: DC
  Administered 2017-02-11 – 2017-02-12 (×9): 2 mg via INTRAVENOUS
  Filled 2017-02-11 (×8): qty 1

## 2017-02-11 NOTE — Care Management Note (Addendum)
Case Management Note  Patient Details  Name: [name] MRN: [id number] Date of Birth: 1961/05/11  Subjective/Objective:   Sepsis d/t UTI, HCAP, Liver cirrhosis, CHF                 Action/Plan: Discharge Planning: Chart reviewed. Referral for IP Hospice. CSW contacted with referral.     Expected Discharge Date:                  Expected Discharge Plan:  Hospice Medical Facility  In-House Referral:  Clinical Social Work  Discharge planning Services  CM Consult  Post Acute Care Choice:  NA Choice offered to:  NA  DME Arranged:  N/A DME Agency:  NA  HH Arranged:  NA HH Agency:  NA  Status of Service:  Completed, signed off  If discussed at Long Length of Stay Meetings, dates discussed:    Additional Comments:  Elliot Cousin, RN 02/11/2017, 11:10 AM

## 2017-02-11 NOTE — Progress Notes (Signed)
Patient refuses oxygen at this point. Oxygen saturations are in the 80's. MD made aware.

## 2017-02-11 NOTE — Progress Notes (Signed)
[facility] Progress Note  Patient: [name] ZOX:[id number]   PCP: [name], MD DOB: 13-Jun-1960   DOA: 02/02/2017   DOS: 02/11/2017   Date of Service: the patient was seen and examined on 02/11/2017  Subjective: Agitated and restless. Also has some respiratory distress. No nausea no vomiting.  Brief hospital course: Pt. with PMH of alcoholic liver cirrhosis, protein calorie malnutrition, as PTI PS, GERD, depression, BPH, COPD, noncompliance,active smoker; admitted on 02/02/2017, presented with complaint of fatigue, was found to have Sepsis HCAP and hepatic encephalopathy. Currently further plan is continue comfort care  Assessment and Plan: 1. Healthcare associated pneumonia. Sepsis due to UTI. Acute hypoxic respiratory failure. Acute on chronic diastolic CHF. Presented with fatigue. Developed shortness of breath as well as volume overload in the hospitalization requiring transfer to step down unit. Currently receiving aggressive diuresis with IV Lasix. Also started on IV vancomycin and cefepime. Later transitioned to Zosyn Not a candidate for intubation, DNR/DNI. On a candidate for NIV. Appreciate CCM input. Recommended comfort care. Discussed with family regarding goals of care currently transitioning to full comfort  2. Liver cirrhosis. Alcohol abuse. Ascites. hepatitic encephalopathy. Continuing lactulose. It appears that the patient may have responded to lactulose somewhat. Continue rifaximin. Continue IV Lasix. Prognosis is severely poor and at present would highly recommend patient to consider hospice with comfort care.  3. Goals of care discussion. Discussed with patient's daughter as well as patient's brother. As per his brother who has been primary caregiver for the patient, "patient would not like to see himself with tubes like this". They felt that the patient does not respond to medical treatment patient should be transitioned to full comfort. Based on this  discussion wishes medical care was transitioned for comfort. Case manager consulted for inpatient hospice transfer. We'll transition patient on scheduled Haldol due to agitation and patient may benefit from scheduled morphine as well due to respiratory distress.  4. COPD exacerbation. Was on IV steroids. Currently transitioned to full comfort  5.hypokalemia. Hyponatremia. Hypocalcemia. Hypomagnesemia. On physical comfort  6. Thrombocytopenia. No active bleeding, on full comfort  Diet: NPO DVT Prophylaxis: Comfort care  Advance goals of care discussion: DNR DNI, comfort care  Family Communication: no family was present at bedside, at the time of interview.  Disposition:  Discharge to be determined.  Consultants: PCCM, palliative care Procedures: none  Antibiotics: Anti-infectives    Start     Dose/Rate Route Frequency Ordered Stop   02/07/17 1100  rifaximin (XIFAXAN) tablet 400 mg  Status:  Discontinued     400 mg Oral 3 times daily 02/07/17 1012 02/08/17 1150   02/07/17 1000  piperacillin-tazobactam (ZOSYN) IVPB 3.375 g  Status:  Discontinued     3.375 g 12.5 mL/hr over 240 Minutes Intravenous Every 8 hours 02/07/17 0909 02/08/17 1150   02/07/17 0000  vancomycin (VANCOCIN) IVPB 1000 mg/200 mL premix  Status:  Discontinued     1,000 mg 200 mL/hr over 60 Minutes Intravenous Every 12 hours 02/06/17 1118 02/07/17 1835   02/06/17 1600  rifaximin (XIFAXAN) tablet 550 mg  Status:  Discontinued     550 mg Per Tube 3 times daily 02/06/17 1520 02/07/17 1011   02/06/17 1230  vancomycin (VANCOCIN) 1,250 mg in sodium chloride 0.9 % 250 mL IVPB     1,250 mg 166.7 mL/hr over 90 Minutes Intravenous  Once 02/06/17 1117 02/06/17 1419   02/06/17 1200  ceFEPIme (MAXIPIME) 1 g in dextrose 5 % 50 mL IVPB  Status:  Discontinued  1 g 100 mL/hr over 30 Minutes Intravenous Every 8 hours 02/06/17 1117 02/07/17 0901   02/04/17 1400  cefTRIAXone (ROCEPHIN) 1 g in dextrose 5 % 50 mL IVPB  Status:   Discontinued     1 g 100 mL/hr over 30 Minutes Intravenous Every 24 hours 02/04/17 1056 02/06/17 1117   02/03/17 0400  piperacillin-tazobactam (ZOSYN) IVPB 3.375 g  Status:  Discontinued     3.375 g 12.5 mL/hr over 240 Minutes Intravenous Every 8 hours 02/02/17 2254 02/04/17 1043   02/02/17 2359  vancomycin (VANCOCIN) IVPB 750 mg/150 ml premix  Status:  Discontinued     750 mg 150 mL/hr over 60 Minutes Intravenous Every 8 hours 02/02/17 2254 02/04/17 1043   02/02/17 1915  cefTRIAXone (ROCEPHIN) 2 g in dextrose 5 % 50 mL IVPB  Status:  Discontinued     2 g 100 mL/hr over 30 Minutes Intravenous Every 24 hours 02/02/17 1905 02/02/17 2248       Objective: Physical Exam: Vitals:   02/10/17 0019 02/10/17 0537 02/10/17 2141 02/11/17 0510  BP: 105/65 126/74 120/70 115/73  Pulse: 100 (!) 107 98 68  Resp: (!) 21  Temp: 97.6 F (36.4 C) 98.1 F (36.7 C) 97.8 F (36.6 C) (!) 97.4 F (36.3 C)  TempSrc: Axillary Axillary Axillary Axillary  SpO2: 94% 97% (!) 83% (!) 83%  Weight:      Height:        Intake/Output Summary (Last 24 hours) at 02/11/17 1507 Last data filed at 02/11/17 0516  Gross per 24 hour  Intake              160 ml  Output             1275 ml  Net            -1115 ml   Filed Weights   02/03/17 0040 02/08/17 0542 02/08/17 2009  Weight: 78.8 kg (173 lb 11.6 oz) 73.1 kg (161 lb 2.5 oz) 69.3 kg (152 lb 12.5 oz)   General: Awake and Confused and not following command nonverbal Appear in severe distress Cardiovascular: S1 and S2 Present, no Murmur,  Respiratory: increased respiratory effort, Bilateral crackles Abdomen: Bowel Sound present,  Extremities: no Pedal edema, Neurologic: Lethargic  Data Reviewed: CBC:  Recent Labs Lab 02/05/17 0328 02/06/17 1229 02/08/17 0311  WBC 10.3 11.5* 16.7*  NEUTROABS  --   --  14.2*  HGB 9.5* 10.4* 11.1*  HCT 26.4* 29.2* 31.8*  MCV 102.7* 107.4* 105.6*  PLT 76* 70* 54*   Basic Metabolic Panel:  Recent Labs Lab  02/05/17 0328 02/06/17 0324 02/06/17 1520 02/07/17 0306 02/08/17 0311  NA 126* 130* 131* 132* 139  K 3.2* 4.4 4.5 3.3* 2.1*  CL 100* 105 105 101 97*  CO2 20* 19* 20* 24 34*  GLUCOSE 176* 106* 111* 151* 124*  BUN 25*  CREATININE 0.59* 0.63 0.78 0.62 0.78  CALCIUM 7.5* 7.7* 7.9* 7.5* 7.4*  MG  --  2.0 1.9 1.9 1.8  PHOS  --   --  3.2 4.1  --     Liver Function Tests:  Recent Labs Lab 02/05/17 0328 02/06/17 0324 02/08/17 0311  AST 97* 93* 141*  ALT 43 45 70*  ALKPHOS 92 104 77  BILITOT 16.5* 18.0* 25.4*  PROT 5.4* 5.6* 5.6*  ALBUMIN 1.3* 1.5* 1.6*   No results for input(s): LIPASE, AMYLASE in the last 168 hours.  Recent Labs Lab 02/06/17 1143  02/08/17 0311  AMMONIA 84* 50*   Coagulation Profile:  Recent Labs Lab 02/06/17 1520 02/08/17 0311  INR 4.03* 3.80   Cardiac Enzymes: No results for input(s): CKTOTAL, CKMB, CKMBINDEX, TROPONINI in the last 168 hours. BNP (last 3 results) No results for input(s): PROBNP in the last 8760 hours. CBG: No results for input(s): GLUCAP in the last 168 hours. Studies: No results found.  Scheduled Meds: . haloperidol lactate  2 mg Intravenous Q6H  . mouth rinse  15 mL Mouth Rinse BID  .  morphine injection  2 mg Intravenous Q4H  . sodium chloride flush  3 mL Intravenous Q12H   Continuous Infusions: . sodium chloride 10 mL/hr at 02/07/17 0800  . sodium chloride     PRN Meds: sodium chloride, acetaminophen **OR** acetaminophen, albuterol, glycopyrrolate **OR** glycopyrrolate **OR** glycopyrrolate, haloperidol **OR** haloperidol **OR** [DISCONTINUED] haloperidol lactate, haloperidol lactate, LORazepam **OR** LORazepam **OR** LORazepam, LORazepam, LORazepam, morphine injection, ondansetron **OR** ondansetron (ZOFRAN) IV, sodium chloride flush  Time spent: 35 minutes  Author: Lynden Oxford, MD [facility] Pager: [phone] 02/11/2017 3:07 PM  If 7PM-7AM, please contact night-coverage at [email or web address],  password [facility]

## 2017-02-11 NOTE — Progress Notes (Signed)
Daily Progress Note   Patient Name: [name]       Date: 02/11/2017 DOB: 1961-03-30  Age: 56 y.o. MRN#: [id number] Attending Physician: Rolly Salter, MD Primary Care Physician: [name], MD Admit Date: 02/02/2017  Reason for Consultation/Follow-up: Establishing goals of care, Non pain symptom management, Pain control and Terminal Care  Subjective: Saw and examined patient today.  More restless at time of exam.  No family at bedside.  See below:  Length of Stay: 9  Current Medications: Scheduled Meds:  . haloperidol lactate  2 mg Intravenous Q6H  . mouth rinse  15 mL Mouth Rinse BID  .  morphine injection  2 mg Intravenous Q4H  . sodium chloride flush  3 mL Intravenous Q12H    Continuous Infusions: . sodium chloride 10 mL/hr at 02/07/17 0800  . sodium chloride      PRN Meds: sodium chloride, acetaminophen **OR** acetaminophen, albuterol, glycopyrrolate **OR** glycopyrrolate **OR** glycopyrrolate, haloperidol **OR** haloperidol **OR** [DISCONTINUED] haloperidol lactate, haloperidol lactate, LORazepam **OR** LORazepam **OR** LORazepam, LORazepam, LORazepam, morphine injection, ondansetron **OR** ondansetron (ZOFRAN) IV, sodium chloride flush  Physical Exam    General: Restless Heart: Regular rate and rhythm. No murmur appreciated. Lungs: Diminished air movement, scattered crackles Abdomen: Soft, nontender, nondistended, positive bowel sounds.  Ext: No significant edema Skin: Warm and dry       Vital Signs: BP 115/73 (BP Location: Left Arm)   Pulse 68   Temp (!) 97.4 F (36.3 C) (Axillary)   Resp (!) 21   Ht 6' (1.829 m)   Wt 69.3 kg (152 lb 12.5 oz)   SpO2 (!) 83%   BMI 20.72 kg/m  SpO2: SpO2: (!) 83 % O2 Device: O2 Device: Not Delivered O2 Flow Rate: O2  Flow Rate (L/min): 5 L/min  Intake/output summary:   Intake/Output Summary (Last 24 hours) at 02/11/17 0900 Last data filed at 02/11/17 0516  Gross per 24 hour  Intake              160 ml  Output             1275 ml  Net            -1115 ml   LBM: Last BM Date: 02/08/17 Baseline Weight: Weight: 74.8 kg (165 lb) Most recent weight: Weight: 69.3 kg (152  lb 12.5 oz)       Palliative Assessment/Data:    Flowsheet Rows     Most Recent Value  Intake Tab  Referral Department  Hospitalist  Unit at Time of Referral  Intermediate Care Unit  Palliative Care Primary Diagnosis  Sepsis/Infectious Disease [ETOH sepsis UTI weakness ]  Palliative Care Type  New Palliative care  Reason for referral  Clarify Goals of Care, Pain  Date first seen by Palliative Care  02/03/17  Clinical Assessment  Palliative Performance Scale Score  30%  Pain Max last 24 hours  6  Pain Min Last 24 hours  4  Dyspnea Max Last 24 Hours  3  Dyspnea Min Last 24 hours  2  Nausea Max Last 24 Hours  3  Nausea Min Last 24 Hours  2  Anxiety Max Last 24 Hours  4  Anxiety Min Last 24 Hours  3  Psychosocial & Spiritual Assessment  Palliative Care Outcomes  Patient/Family meeting held?  Yes  Who was at the meeting?  patient who is awake alert   Palliative Care Outcomes  Clarified goals of care      Patient Active Problem List   Diagnosis Date Noted  . Generalized pain   . Sepsis (HCC) 02/02/2017  . Hyponatremia 11/11/2016  . Urinary retention 11/11/2016  . Acute lower UTI 11/11/2016  . Acute renal failure with tubular necrosis (HCC) 03/25/2016  . Pulmonary nodule 03/24/2016  . Cirrhosis (HCC) 03/24/2016  . Acute kidney injury (HCC) 03/24/2016  . Umbilical hernia, incarcerated 02/07/2016  . GERD (gastroesophageal reflux disease) 11/22/2015  . Depression 09/28/2015  . Protein-calorie malnutrition, severe 09/20/2015  . Chronic liver disease and cirrhosis (HCC)   . AKI (acute kidney injury) (HCC) 09/16/2015    . Chronic respiratory failure (HCC) 09/06/2015  . Hypocalcemia 09/06/2015  . Diffuse abdominal pain 09/06/2015  . Ascites 09/05/2015  . SBP (spontaneous bacterial peritonitis) (HCC) 09/05/2015  . Protein-calorie malnutrition (HCC) 07/22/2015  . Encounter for palliative care   . Goals of care, counseling/discussion   . Abdominal discomfort   . Pancreatitis, acute 04/22/2015  . Hypokalemia 04/22/2015  . Hypomagnesemia 04/22/2015  . Decompensated hepatic cirrhosis (HCC) 04/21/2015  . Healthcare-associated pneumonia 04/21/2015  . Hyponatremia with excess extracellular fluid volume 04/21/2015  . Tobacco abuse 04/20/2015  . Alcoholic cirrhosis of liver with ascites (HCC)   . SOB (shortness of breath)   . Alcohol abuse 03/12/2015  . Thrombocytopenia (HCC) 03/12/2015  . Hyperbilirubinemia 03/12/2015  . Alcohol dependence (HCC) 08/25/2013    Palliative Care Assessment & Plan   Patient Profile: 56 y.o.malewith medical history significant of alcoholic liver cirrhosis (s/p TIPS in 02/2016), protein calorie malnutrition, GERD, depression, BPH and urinary retention , COPD, medicalnoncompliance, tobacco abuse now unresponsive with sepsis related to PNA  Recommendations/Plan:  Full comfort care.  Restless this AM.  Reviewed PRN usage and discussed care plan with Dr. Allena Katz.  Will schedule haldol  Q6hrs  Additional haldol every 3 hours as needed  Schedule morphine every 4 hours  Additional morphine every 2 hours as needed    Code Status:    Code Status Orders        Start     Ordered   02/02/17 2248  Do not attempt resuscitation (DNR)  Continuous    Question Answer Comment  In the event of cardiac or respiratory ARREST Do not call a "code blue"   In the event of cardiac or respiratory ARREST Do not perform Intubation, CPR, defibrillation or ACLS  In the event of cardiac or respiratory ARREST Use medication by any route, position, wound care, and other measures to  relive pain and suffering. May use oxygen, suction and manual treatment of airway obstruction as needed for comfort.      02/02/17 2248    Code Status History    Date Active Date Inactive Code Status Order ID Comments User Context   11/11/2016  3:47 AM 11/12/2016  6:20 PM Full Code [id number]  Michael Litter, MD Inpatient   03/24/2016  8:33 PM 04/01/2016  6:44 PM Full Code [id number]  Gwenyth Bender, NP Inpatient   09/16/2015  9:13 PM 09/22/2015  2:52 PM Full Code [id number]  Hillary Bow, DO ED   09/05/2015  8:31 PM 09/10/2015  5:51 PM Full Code [id number]  Joella Prince, MD ED   04/21/2015  4:42 AM 04/25/2015  3:53 PM Full Code [id number]  Clydie Braun, MD Inpatient   04/10/2015 10:34 PM 04/15/2015  5:19 PM Full Code [id number]  Eston Esters, MD ED   03/28/2015  6:23 AM 03/29/2015  4:24 PM Full Code [id number]  Rolly Salter, MD Inpatient   03/12/2015  3:59 PM 03/15/2015  8:08 PM Full Code [id number]  Jerald Kief, MD ED   08/25/2013  4:16 PM 08/29/2013  5:32 PM Full Code [id number]  Sanjuana Kava, NP Inpatient   08/25/2013 12:36 AM 08/25/2013  4:16 PM Full Code [id number]  Lear Ng., MD ED       Prognosis:  Days to > 2 weeks.  He has increasing symptom burden with prognosis of less than 2 weeks.  He would benefit from residential hospice for end of life care if it can be arranged.   Discharge Planning: Terminal admission vs residential hospice  Discussed with Dr. Allena Katz and bedside RN  Thank you for allowing the Palliative Medicine Team to assist in the care of this patient.   Total Time 30 Prolonged Time Billed No      Greater than 50%  of this time was spent counseling and coordinating care related to the above assessment and plan.  Romie Minus, MD  Please contact Palliative Medicine Team phone at [phone] for questions and concerns.

## 2017-02-11 NOTE — Progress Notes (Addendum)
Received information from medical team indicating pt appropriate for residential hospice. Attempted to meet with pt- pt unresponsive. Left voicemail for pt's daughter. Will follow and assist with referral to residential hospice once contact made with daughter to investigate family preferences.   Ilean Skill, MSW, LCSW Clinical Social Work 02/11/2017 Weekend Coverage  [phone]  Received call back from pt's daughter who confirms family interested in [facility] for comfort care for pt. CSW made referral.

## 2017-02-12 MED ORDER — MORPHINE SULFATE (PF) 4 MG/ML IV SOLN: 2.0000 mg | INTRAVENOUS | 0 refills | Status: AC

## 2017-02-12 MED ORDER — HALOPERIDOL LACTATE 5 MG/ML IJ SOLN: 2.0000 mg | Freq: Four times a day (QID) | INTRAMUSCULAR | Status: AC

## 2017-02-12 NOTE — Progress Notes (Signed)
CSW following to assist with transition to Hospice.  Spoke with both pt's daughter and with [facility] liaison- Psychologist, sport and exercise availability expected today for pt. Daughter meeting with liaison this afternoon to complete paperwork. Updated attending and RN.  Will follow and assist with transfer/DC.  Ilean Skill, MSW, LCSW Clinical Social Work 02/12/2017 [phone]

## 2017-02-12 NOTE — Progress Notes (Signed)
Attempted to call report to [facility], spoke with Nettie Elm, she states nurse unable to take report and asked that I call back

## 2017-02-12 NOTE — Progress Notes (Signed)
[facility] [employer] Received request for family interest in [place] from [place]. Chart reviewed and met with daughter Crystal at bedside to complete paperwork. Dr. Orpah Melter to assume care per family preference. Discharge summary has been faxed.   RN please call report to [phone].  Thank you,  Erling Conte, LCSW [phone]

## 2017-02-12 NOTE — Progress Notes (Signed)
Daily Progress Note   Patient Name: [name]       Date: 02/12/2017 DOB: Apr 30, 1961  Age: 56 y.o. MRN#: [id number] Attending Physician: Rolly Salter, MD Primary Care Physician: [name], MD Admit Date: 02/02/2017  Reason for Consultation/Follow-up: Establishing goals of care, Non pain symptom management, Pain control and Terminal Care  Subjective: Saw and examined patient today.  Resting comfortably.  Actively dying.  No family at bedside.  See below:  Length of Stay: 10  Current Medications: Scheduled Meds:  . haloperidol lactate  2 mg Intravenous Q6H  . mouth rinse  15 mL Mouth Rinse BID  .  morphine injection  2 mg Intravenous Q4H  . sodium chloride flush  3 mL Intravenous Q12H    Continuous Infusions: . sodium chloride 10 mL/hr at 02/11/17 1613  . sodium chloride      PRN Meds: sodium chloride, acetaminophen **OR** acetaminophen, albuterol, glycopyrrolate **OR** glycopyrrolate **OR** glycopyrrolate, haloperidol **OR** haloperidol **OR** [DISCONTINUED] haloperidol lactate, haloperidol lactate, LORazepam **OR** LORazepam **OR** LORazepam, LORazepam, morphine injection, ondansetron **OR** ondansetron (ZOFRAN) IV, sodium chloride flush  Physical Exam    General: Somnolent. In no distress Heart: Regular rate and rhythm. No murmur appreciated. Lungs: Diminished air movement, scattered crackles Abdomen: Soft, nontender, nondistended, positive bowel sounds.  Ext: No significant edema Skin: Warm and dry       Vital Signs: BP 115/73 (BP Location: Left Arm)   Pulse 68   Temp (!) 97.4 F (36.3 C) (Axillary)   Resp (!) 21   Ht 6' (1.829 m)   Wt 69.3 kg (152 lb 12.5 oz)   SpO2 (!) 83%   BMI 20.72 kg/m  SpO2: SpO2: (!) 83 % O2 Device: O2 Device: Not Delivered O2  Flow Rate: O2 Flow Rate (L/min): 5 L/min  Intake/output summary:   Intake/Output Summary (Last 24 hours) at 02/12/17 0953 Last data filed at 02/12/17 0655  Gross per 24 hour  Intake              320 ml  Output               10 ml  Net              310 ml   LBM: Last BM Date: 02/08/17 Baseline Weight: Weight: 74.8 kg (165 lb) Most recent  weight: Weight: 69.3 kg (152 lb 12.5 oz)       Palliative Assessment/Data:    Flowsheet Rows     Most Recent Value  Intake Tab  Referral Department  Hospitalist  Unit at Time of Referral  Intermediate Care Unit  Palliative Care Primary Diagnosis  Sepsis/Infectious Disease [ETOH sepsis UTI weakness ]  Palliative Care Type  New Palliative care  Reason for referral  Clarify Goals of Care, Pain  Date first seen by Palliative Care  02/03/17  Clinical Assessment  Palliative Performance Scale Score  30%  Pain Max last 24 hours  6  Pain Min Last 24 hours  4  Dyspnea Max Last 24 Hours  3  Dyspnea Min Last 24 hours  2  Nausea Max Last 24 Hours  3  Nausea Min Last 24 Hours  2  Anxiety Max Last 24 Hours  4  Anxiety Min Last 24 Hours  3  Psychosocial & Spiritual Assessment  Palliative Care Outcomes  Patient/Family meeting held?  Yes  Who was at the meeting?  patient who is awake alert   Palliative Care Outcomes  Clarified goals of care      Patient Active Problem List   Diagnosis Date Noted  . Generalized pain   . Sepsis (HCC) 02/02/2017  . Hyponatremia 11/11/2016  . Urinary retention 11/11/2016  . Acute lower UTI 11/11/2016  . Acute renal failure with tubular necrosis (HCC) 03/25/2016  . Pulmonary nodule 03/24/2016  . Cirrhosis (HCC) 03/24/2016  . Acute kidney injury (HCC) 03/24/2016  . Umbilical hernia, incarcerated 02/07/2016  . GERD (gastroesophageal reflux disease) 11/22/2015  . Depression 09/28/2015  . Protein-calorie malnutrition, severe 09/20/2015  . Chronic liver disease and cirrhosis (HCC)   . AKI (acute kidney injury) (HCC)  09/16/2015  . Chronic respiratory failure (HCC) 09/06/2015  . Hypocalcemia 09/06/2015  . Diffuse abdominal pain 09/06/2015  . Ascites 09/05/2015  . SBP (spontaneous bacterial peritonitis) (HCC) 09/05/2015  . Protein-calorie malnutrition (HCC) 07/22/2015  . Encounter for palliative care   . Goals of care, counseling/discussion   . Abdominal discomfort   . Pancreatitis, acute 04/22/2015  . Hypokalemia 04/22/2015  . Hypomagnesemia 04/22/2015  . Decompensated hepatic cirrhosis (HCC) 04/21/2015  . Healthcare-associated pneumonia 04/21/2015  . Hyponatremia with excess extracellular fluid volume 04/21/2015  . Tobacco abuse 04/20/2015  . Alcoholic cirrhosis of liver with ascites (HCC)   . SOB (shortness of breath)   . Alcohol abuse 03/12/2015  . Thrombocytopenia (HCC) 03/12/2015  . Hyperbilirubinemia 03/12/2015  . Alcohol dependence (HCC) 08/25/2013    Palliative Care Assessment & Plan   Patient Profile: 56 y.o.malewith medical history significant of alcoholic liver cirrhosis (s/p TIPS in 02/2016), protein calorie malnutrition, GERD, depression, BPH and urinary retention , COPD, medicalnoncompliance, tobacco abuse now unresponsive with sepsis related to PNA  Recommendations/Plan:  Full comfort care.  Appears much more comfortable this AM.  Continue haldol  Q6hrs.  Additional haldol every 3 hours as needed.  Continue morphine every 4 hours. Additional morphine every 2 hours as needed.    Code Status:    Code Status Orders        Start     Ordered   02/02/17 2248  Do not attempt resuscitation (DNR)  Continuous    Question Answer Comment  In the event of cardiac or respiratory ARREST Do not call a "code blue"   In the event of cardiac or respiratory ARREST Do not perform Intubation, CPR, defibrillation or ACLS   In the event of  cardiac or respiratory ARREST Use medication by any route, position, wound care, and other measures to relive pain and suffering. May use  oxygen, suction and manual treatment of airway obstruction as needed for comfort.      02/02/17 2248    Code Status History    Date Active Date Inactive Code Status Order ID Comments User Context   11/11/2016  3:47 AM 11/12/2016  6:20 PM Full Code [id number]  Michael Litter, MD Inpatient   03/24/2016  8:33 PM 04/01/2016  6:44 PM Full Code [id number]  Gwenyth Bender, NP Inpatient   09/16/2015  9:13 PM 09/22/2015  2:52 PM Full Code [id number]  Hillary Bow, DO ED   09/05/2015  8:31 PM 09/10/2015  5:51 PM Full Code [id number]  Joella Prince, MD ED   04/21/2015  4:42 AM 04/25/2015  3:53 PM Full Code [id number]  Clydie Braun, MD Inpatient   04/10/2015 10:34 PM 04/15/2015  5:19 PM Full Code [id number]  Eston Esters, MD ED   03/28/2015  6:23 AM 03/29/2015  4:24 PM Full Code [id number]  Rolly Salter, MD Inpatient   03/12/2015  3:59 PM 03/15/2015  8:08 PM Full Code [id number]  Jerald Kief, MD ED   08/25/2013  4:16 PM 08/29/2013  5:32 PM Full Code [id number]  Sanjuana Kava, NP Inpatient   08/25/2013 12:36 AM 08/25/2013  4:16 PM Full Code [id number]  Lear Ng., MD ED       Prognosis:  Days to > 2 weeks.  He has increasing symptom burden with prognosis of less than 2 weeks.  He would benefit from residential hospice for end of life care if it can be arranged.   Discharge Planning: Terminal admission vs residential hospice  Discussed with bedside RN  Thank you for allowing the Palliative Medicine Team to assist in the care of this patient.   Total Time 20 Prolonged Time Billed No      Greater than 50%  of this time was spent counseling and coordinating care related to the above assessment and plan.  Romie Minus, MD  Please contact Palliative Medicine Team phone at [phone] for questions and concerns.

## 2017-02-12 NOTE — Progress Notes (Signed)
Called [employer] and gave report to [place], patient just picked up by [facility] and is en route, IV access to RFA and Foley catheter left intact

## 2017-02-12 NOTE — Progress Notes (Signed)
Pt discharging today- transfer to [facility] for residential hospice care. Report [phone].  Pt will transport via [facility]- CSW completed medical necessity form and arranged transportation.  Daughter completed admissions paperwork and states she will meet pt at [facility] later this evening after his transport. Dtr had questions re: accessing pt's bank account and managing pt's finances. Advised speak with financial institutions and legal advisors if needed. Dtr agrees.  Ilean Skill, MSW, LCSW Clinical Social Work 02/12/2017 [phone]

## 2017-02-12 NOTE — Discharge Summary (Signed)
[facility] Discharge Summary   Patient: [name] ZOX:[id number]   PCP: [name], MD DOB: 1960-07-18   Date of admission: 02/02/2017   Date of discharge:  02/12/2017    Discharge Diagnoses:  Active Problems:   Alcohol abuse   Thrombocytopenia (HCC)   Alcoholic cirrhosis of liver with ascites (HCC)   Hyponatremia with excess extracellular fluid volume   Protein-calorie malnutrition (HCC)   Hypocalcemia   Chronic liver disease and cirrhosis (HCC)   GERD (gastroesophageal reflux disease)   Acute lower UTI   Sepsis (HCC)   Generalized pain   Admitted From: home Disposition:  Inpatient hospice facility  Recommendations for Outpatient Follow-up:  1. Please establish care with inpatient hospice facility    Diet recommendation: comfort feeds  Activity: The patient is advised to gradually reintroduce usual activities.  Discharge Condition: stable  Code Status: DNR DNI, comfort care  History of present illness: As per the H and P dictated on admission, "[name] is a 56 y.o. male with medical history significant of alcoholic liver cirrhosis (s/p TIPS in 02/2016), protein calorie malnutrition, GERD, depression, BPH and urinary retention  , COPD,  medical noncompliance, tobacco abuse    Presented with generalized fatigue and blood in urine for past few days. Continues to have abdominal swelling. Endorses abdominal discomfort. Endorses suprapubic pain and tenderness diarrhea nausea and some vomiting which has been nonbloody. He continues to have dysuria. Denies any fever at home.  He's been having some persistent chest pain is nonexertional and nonradiating and constant for the past 6 days.  Patient has history of long-standing ascites and pedal edema 40 controls secondary to noncompliance with Spironolactone and Lasix. He was seen in January 11 2017 he began was found to have distended bladder which was probably causing pain and was found to have urinary tract  infection foley catheter was placed he was discharged on antibiotics but never filled the prescription. Patient continues to drink daily. He was brought by EMS to ER secondary to abdominal pain on September 3 but left [facility]."  Hospital Course:  Summary of his active problems in the hospital is as following. 1. Healthcare associated pneumonia. Sepsis due to UTI. Acute hypoxic respiratory failure. Acute on chronic diastolic CHF.  Presented with fatigue.  Developed shortness of breath as well as volume overload in the hospitalization requiring transfer to step down unit. Received aggressive diuresis with IV Lasix. Also started on IV vancomycin and cefepime. Later transitioned to [facility] was consulted  Not a candidate for intubation, DNR/DNI.  Due to vomiting not a candidate for NIV.  Appreciate CCM input. Recommended comfort care.  Discussed with family regarding goals of care currently transitioning to full comfort  2. Liver cirrhosis. Alcohol abuse. Ascites. hepatitic encephalopathy. Nausea and vomiting  NG tube was inserted and pt was given lactulose. Ammonia level came down but mentation did not improve Bilirubin going up.  Also given rifaximin. MELD score 32. Prognosis significantly poor. Based on this discussion with family, decision was made to transition care to comfort. Patient will be transferred to inpatient hospice facility.  3. Goals of care discussion. Discussed with patient's daughter as well as patient's brother. As per his brother who has been primary caregiver for the patient, "patient would not like to see himself with tubes like this". They felt that the patient does not respond to medical treatment patient should be transitioned to full comfort. Based on this discussion medical care was transitioned for comfort. Case manager consulted for inpatient  hospice transfer. We'll transition patient on scheduled Haldol due to agitation and patient may benefit from  scheduled morphine as well due to respiratory distress.  4. COPD exacerbation. Was on IV steroids. Currently transitioned to full comfort  5.hypokalemia. Hyponatremia. Hypocalcemia. Hypomagnesemia. On comfort  6. Thrombocytopenia. No active bleeding, on full comfort  Patient's caregiver transition to comfort care. Hospice was consulted. Family chose as a residential hospice facility. Patient's care was transferred to [facility].  Procedures and Results:  NG tube insertion  Echocardiogram   Consultations:  Palliative care, PCCM   DISCHARGE MEDICATION: Current Discharge Medication List    START taking these medications   Details  haloperidol lactate (HALDOL) 5 MG/ML injection Inject 0.4 mLs (2 mg total) into the vein every 6 (six) hours. Qty: 1 mL    morphine 4 MG/ML injection Inject 0.5 mLs (2 mg total) into the vein every 4 (four) hours. Refills: 0      CONTINUE these medications which have NOT CHANGED   Details  albuterol (PROVENTIL HFA;VENTOLIN HFA) 108 (90 Base) MCG/ACT inhaler Inhale 2 puffs into the lungs every 6 (six) hours as needed for wheezing or shortness of breath. Qty: 54 g, Refills: 0   Associated Diagnoses: Chronic respiratory failure with hypoxia (HCC)      STOP taking these medications     cefpodoxime (VANTIN) 200 MG tablet      cephALEXin (KEFLEX) 500 MG capsule      chlordiazePOXIDE (LIBRIUM) 5 MG capsule      citalopram (CELEXA) 20 MG tablet      diclofenac sodium (VOLTAREN) 1 % GEL      feeding supplement, ENSURE ENLIVE, (ENSURE ENLIVE) LIQD      folic acid (FOLVITE) 1 MG tablet      furosemide (LASIX) 20 MG tablet      lactulose (CHRONULAC) 10 GM/15ML solution      ondansetron (ZOFRAN) 4 MG tablet      potassium chloride SA (K-DUR,KLOR-CON) 20 MEQ tablet      ranitidine (ZANTAC) 150 MG tablet      spironolactone (ALDACTONE) 50 MG tablet      tamsulosin (FLOMAX) 0.4 MG CAPS capsule      tamsulosin (FLOMAX) 0.4 MG CAPS  capsule      thiamine 100 MG tablet      VOLTAREN 1 % GEL        No Known Allergies  Discharge Exam: Filed Weights   02/03/17 0040 02/08/17 0542 02/08/17 2009  Weight: 78.8 kg (173 lb 11.6 oz) 73.1 kg (161 lb 2.5 oz) 69.3 kg (152 lb 12.5 oz)   Vitals:   02/10/17 2141 02/11/17 0510  BP: 120/70 115/73  Pulse: 98 68  Resp: 20 (!) 21  Temp: 97.8 F (36.6 C) (!) 97.4 F (36.3 C)  SpO2: (!) 83% (!) 83%   General: Appear in moderate distress, no Rash; Oral Mucosa dry. Cardiovascular: S1 and S2 Present, no Murmur, difficult to assess JVD Respiratory: Bilateral Air entry present and basal Crackles, bilateral Occasional  wheezes Abdomen: Bowel Sound present, Extremities: no Pedal edema,  Neurology: lethargic  The results of significant diagnostics from this hospitalization (including imaging, microbiology, ancillary and laboratory) are listed below for reference.    Significant Diagnostic Studies: Dg Chest 2 View  Result Date: 02/02/2017 CLINICAL DATA:  Generalized weakness.  Hematuria.  Malaise. EXAM: CHEST  2 VIEW COMPARISON:  01/11/2017. FINDINGS: Interval borderline enlargement of the cardiac silhouette. Clear lungs. Unremarkable bones. Mild diffuse peribronchial thickening without significant change. IMPRESSION:  No acute abnormality.  Stable mild chronic bronchitic changes. Electronically Signed   By: Beckie Salts M.D.   On: 02/02/2017 19:26   Dg Abd 1 View  Result Date: 02/06/2017 CLINICAL DATA:  Encounter for NG tube placement Pt extremely noncompliant with positioning instructions and instructions on not to move EXAM: ABDOMEN - 1 VIEW COMPARISON:  11/11/2016 FINDINGS: Nasogastric tube has been placed, tip overlying the level of the stomach, side port within the region of the gastroesophageal junction. Visualized bowel gas pattern is nonobstructed. Tips shunt projects over the right upper quadrant. Multiple old rib fractures. Bibasilar lung opacities are partially imaged.  IMPRESSION: 1. Interval placement of nasogastric tube. 2. Partially imaged lung opacities, consistent with airspace filling. Electronically Signed   By: Norva Pavlov M.D.   On: 02/06/2017 20:22   US Abdomen Complete  Result Date: 02/02/2017 CLINICAL DATA:  Ascites.  Cirrhosis.  Hydronephrosis. EXAM: ABDOMEN ULTRASOUND COMPLETE COMPARISON:  CT 01/11/2017 FINDINGS: Gallbladder: Gallbladder is distended with sludge and multiple shadowing stones. Mild gallbladder wall thickening measuring 5 mm. Negative sonographic Murphy sign. Common bile duct: Diameter: 10 mm, unchanged from prior CT Liver: No focal lesion identified. Diffusely increased and heterogeneous in parenchymal echogenicity. Portal vein is patent on color Doppler imaging with normal direction of blood flow towards the liver. TIPS not interrogated but visualized at the portal vein. IVC: Not well visualized. Pancreas: Not well visualized. Spleen: Size and appearance within normal limits. Right Kidney: Length: 11.3 cm. Echogenicity within normal limits. No mass or hydronephrosis visualized. Left Kidney: Length: 12.5 cm. Echogenicity within normal limits. No mass or hydronephrosis visualized. Abdominal aorta: No aneurysm visualized. Majority of the aorta is not well seen. Other findings: Small volume of perihepatic ascites. IMPRESSION: 1. Echogenic liver consistent with hepatic steatosis or other chronic liver disease. No discrete focal lesion is seen sonographically. 2. Distended gallbladder sludge and multiple shadowing stones. Gallbladder wall thickening, likely secondary to chronic liver disease. There is stable biliary dilatation from prior CT. No sonographic Murphy sign. 3. Small volume intra-abdominal ascites. 4. No hydronephrosis. 5. Midline structures including the abdominal aorta, IVC, and pancreas are obscured. Electronically Signed   By: Rubye Oaks M.D.   On: 02/02/2017 23:25   Dg Chest Port 1v Same Day  Result Date:  02/06/2017 CLINICAL DATA:  Acute shortness of breath. EXAM: PORTABLE CHEST 1 VIEW COMPARISON:  02/02/2017, 01/11/2017 and earlier, including CT chest 03/24/2016. FINDINGS: Patient rotated to the right. Cardiac silhouette mildly enlarged for AP portable technique, unchanged. Interval development of airspace consolidation throughout the left lung, the right upper lobe, and to a lesser degree the right middle and lower lobes since the examination 4 days ago. Small bilateral pleural effusions. IMPRESSION: 1. Acute multilobar pneumonia (favored over asymmetric airspace pulmonary edema), a new finding since the examination 4 days ago. 2. New small bilateral pleural effusions. Electronically Signed   By: Hulan Saas M.D.   On: 02/06/2017 08:15    Microbiology: Recent Results (from the past 240 hour(s))  Blood Culture (routine x 2)     Status: None   Collection Time: 02/02/17  6:03 PM  Result Value Ref Range Status   Specimen Description BLOOD RIGHT FOREARM  Final   Special Requests   Final    BOTTLES DRAWN AEROBIC AND ANAEROBIC Blood Culture adequate volume   Culture   Final    NO GROWTH 5 DAYS Performed at [facility], [address] [place], [place] [address]    Report Status 02/07/2017  FINAL  Final  Blood Culture (routine x 2)     Status: None   Collection Time: 02/02/17  6:08 PM  Result Value Ref Range Status   Specimen Description BLOOD LEFT HAND  Final   Special Requests   Final    BOTTLES DRAWN AEROBIC AND ANAEROBIC Blood Culture adequate volume   Culture   Final    NO GROWTH 5 DAYS Performed at [facility], [address], [place], [place] [address]    Report Status 02/07/2017 FINAL  Final  MRSA PCR Screening     Status: None   Collection Time: 02/02/17 11:49 PM  Result Value Ref Range Status   MRSA by PCR NEGATIVE NEGATIVE Final    Comment:        The GeneXpert MRSA Assay (FDA approved for NASAL specimens only), is one component of a comprehensive MRSA  colonization surveillance program. It is not intended to diagnose MRSA infection nor to guide or monitor treatment for MRSA infections.   Culture, Urine     Status: None   Collection Time: 02/04/17  8:53 AM  Result Value Ref Range Status   Specimen Description URINE, RANDOM  Final   Special Requests NONE  Final   Culture   Final    NO GROWTH Performed at [facility], [address], [place], [place] [address]    Report Status 02/05/2017 FINAL  Final  Culture, blood (Routine X 2) w Reflex to ID Panel     Status: None   Collection Time: 02/06/17  3:15 PM  Result Value Ref Range Status   Specimen Description BLOOD LEFT HAND  Final   Special Requests IN PEDIATRIC BOTTLE Blood Culture adequate volume  Final   Culture   Final    NO GROWTH 5 DAYS Performed at [facility], [address] [place], [place] [address]    Report Status 02/11/2017 FINAL  Final  Culture, blood (Routine X 2) w Reflex to ID Panel     Status: None   Collection Time: 02/06/17  3:30 PM  Result Value Ref Range Status   Specimen Description BLOOD LEFT HAND  Final   Special Requests   Final    BOTTLES DRAWN AEROBIC ONLY Blood Culture results may not be optimal due to an excessive volume of blood received in culture bottles   Culture   Final    NO GROWTH 5 DAYS Performed at [facility], [address] [place], [place] [address]    Report Status 02/11/2017 FINAL  Final     Labs: CBC:  Recent Labs Lab 02/06/17 1229 02/08/17 0311  WBC 11.5* 16.7*  NEUTROABS  --  14.2*  HGB 10.4* 11.1*  HCT 29.2* 31.8*  MCV 107.4* 105.6*  PLT 70* 54*   Basic Metabolic Panel:  Recent Labs Lab 02/06/17 0324 02/06/17 1520 02/07/17 0306 02/08/17 0311  NA 130* 131* 132* 139  K 4.4 4.5 3.3* 2.1*  CL 105 105 101 97*  CO2 19* 20* 24 34*  GLUCOSE 106* 111* 151* 124*  BUN 25*  CREATININE 0.63 0.78 0.62 0.78  CALCIUM 7.7* 7.9* 7.5* 7.4*  MG 2.0 1.9 1.9 1.8  PHOS  --  3.2 4.1  --     Liver Function Tests:  Recent Labs Lab 02/06/17 0324 02/08/17 0311  AST 93* 141*  ALT 45 70*  ALKPHOS 104 77  BILITOT 18.0* 25.4*  PROT 5.6* 5.6*  ALBUMIN 1.5* 1.6*  No results for input(s): LIPASE, AMYLASE in the last 168 hours.  Recent Labs Lab 02/06/17 1143 02/08/17 0311  AMMONIA 84* 50*   Cardiac Enzymes: No results for input(s): CKTOTAL, CKMB, CKMBINDEX, TROPONINI in the last 168 hours. BNP (last 3 results)  Recent Labs  11/11/16 1254  BNP 454.7*   CBG: No results for input(s): GLUCAP in the last 168 hours. Time spent: 35 minutes  Signed:  Denita Lun  [facility]  02/12/2017  , 11:15 AM

## 2017-02-12 NOTE — Progress Notes (Signed)
CSW following for discharge.

## 2017-02-26 DEATH — deceased

## 2017-10-10 IMAGING — CT CT CHEST W/O CM
2 of 4 series · 11 of 36 positions shown, 13 images · non-contrast
Comparison: Chest radiographs 03/24/2016.

ADDENDUM:
These results were called by telephone at the time of interpretation
on 03/24/2016 at [DATE] to Dr. Jus Ntae Santudu, who verbally
acknowledged these results.
CLINICAL DATA: Incarcerated umbilical hernia. Left lung nodule.
Post TIPS procedure today.

EXAM:
CT CHEST, ABDOMEN AND PELVIS WITHOUT CONTRAST
TECHNIQUE: Multidetector CT imaging of the chest, abdomen and pelvis was
performed following the standard protocol without IV contrast.

[Series 2: cap wo 5.0 i31f 1 · axial · 0.76mm/px · z∈[+556,+1126]mm · 8 of 136 slices shown, 10 images]
[im 11/136  mediastinal]
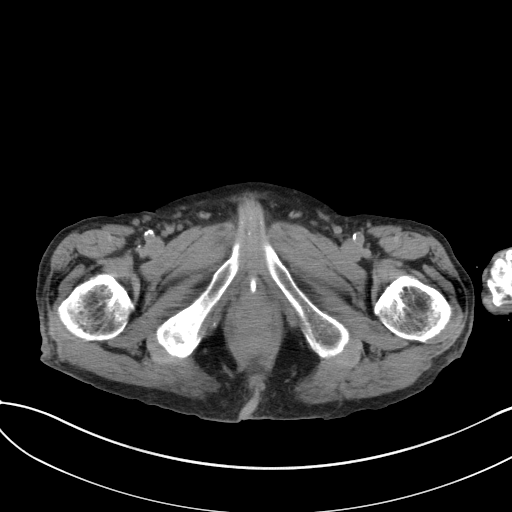
[im 11/136  lung]
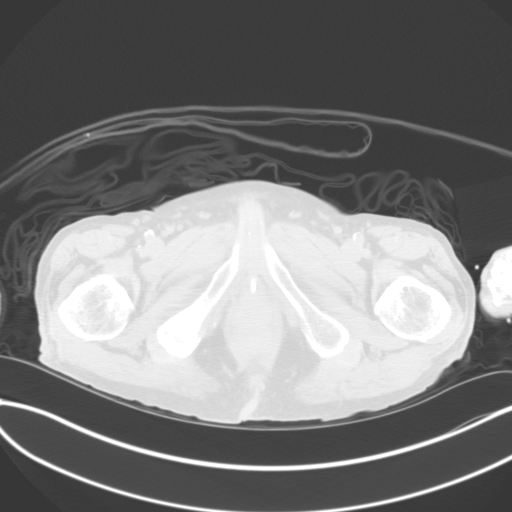
[im 32/136  lung]
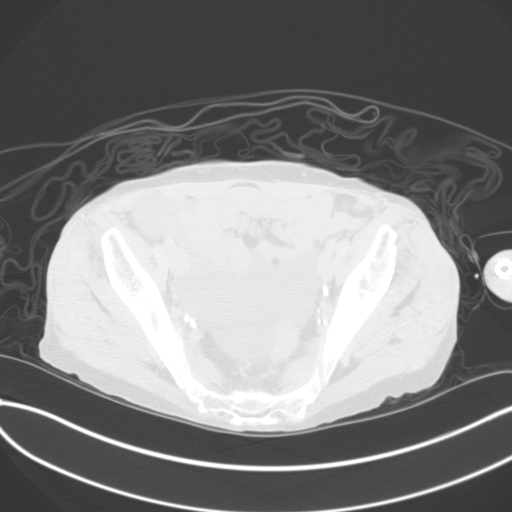
[im 42/136  lung]
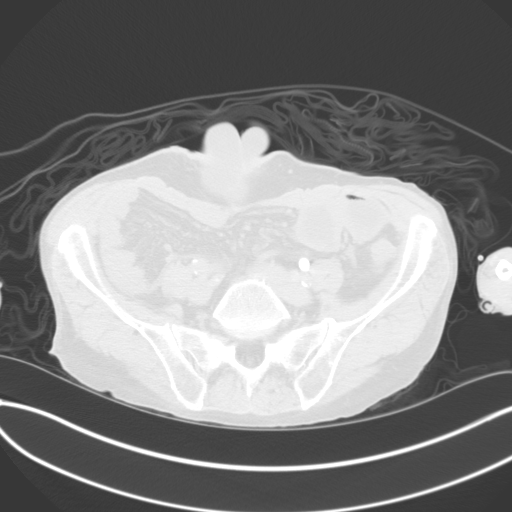
[im 63/136  lung]
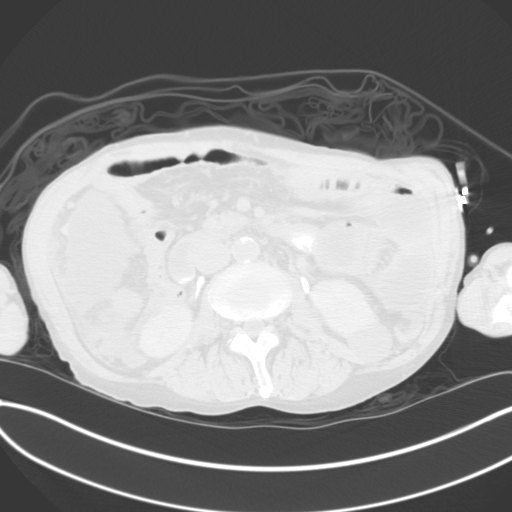
[im 73/136  mediastinal]
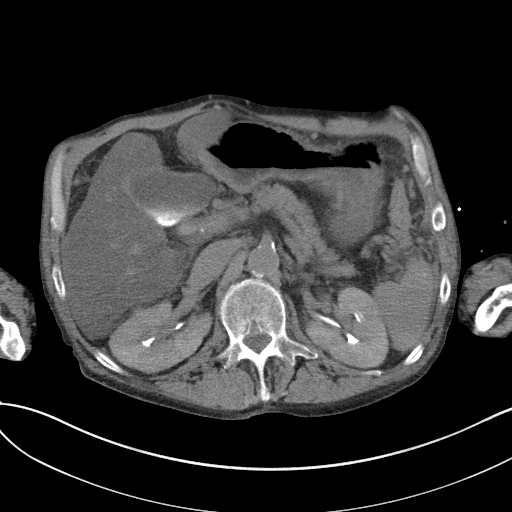
[im 73/136  lung]
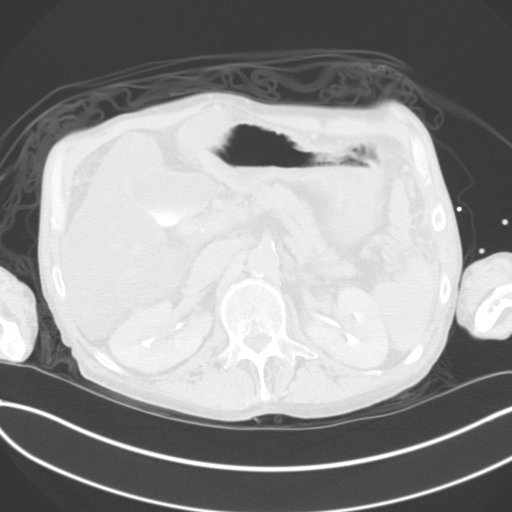
[im 94/136  lung]
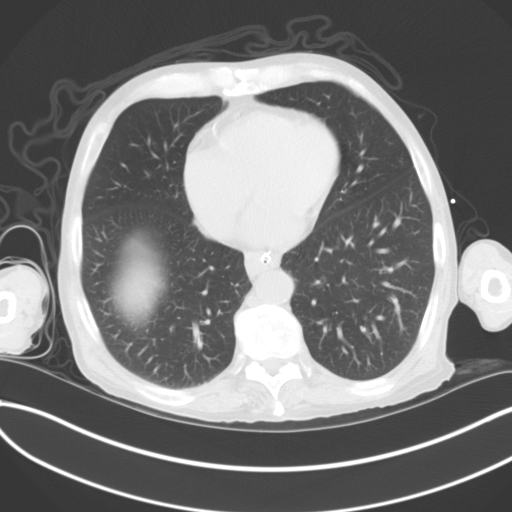
[im 104/136  lung]
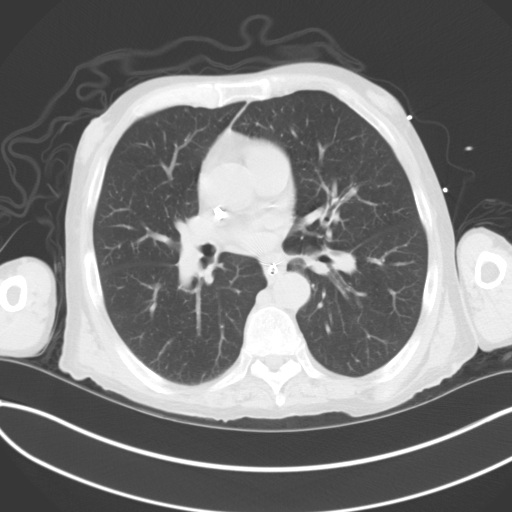
[im 125/136  lung]
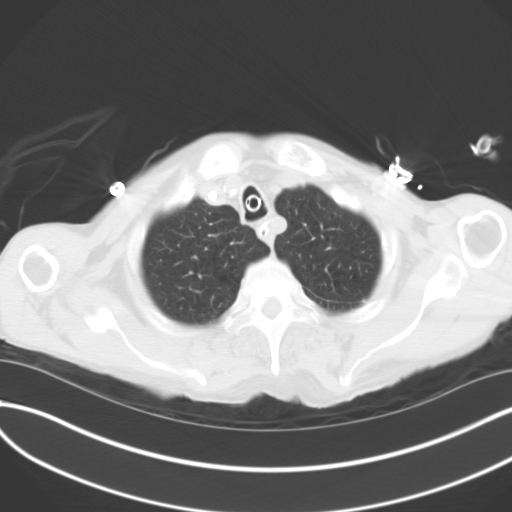

[Series 5: coronal · coronal · 0.71mm/px · 3 of 145 slices shown]
[im 29/145  lung]
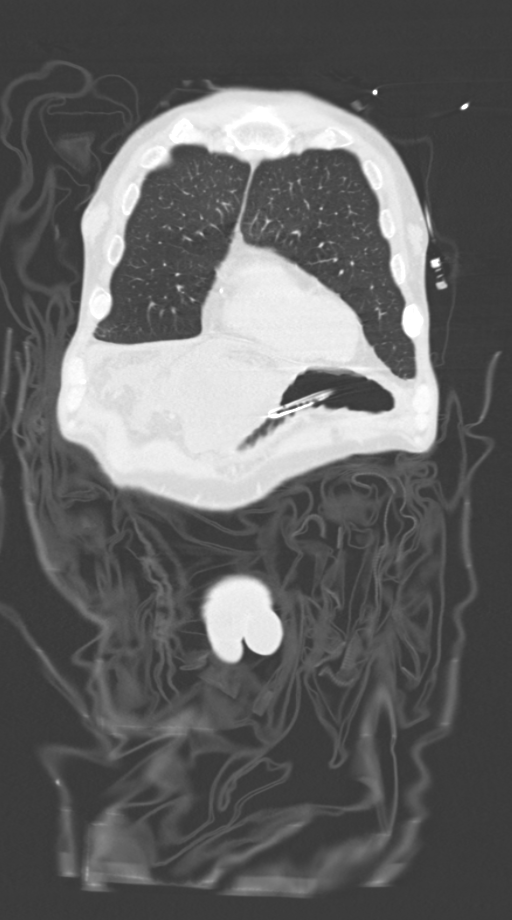
[im 58/145  lung]
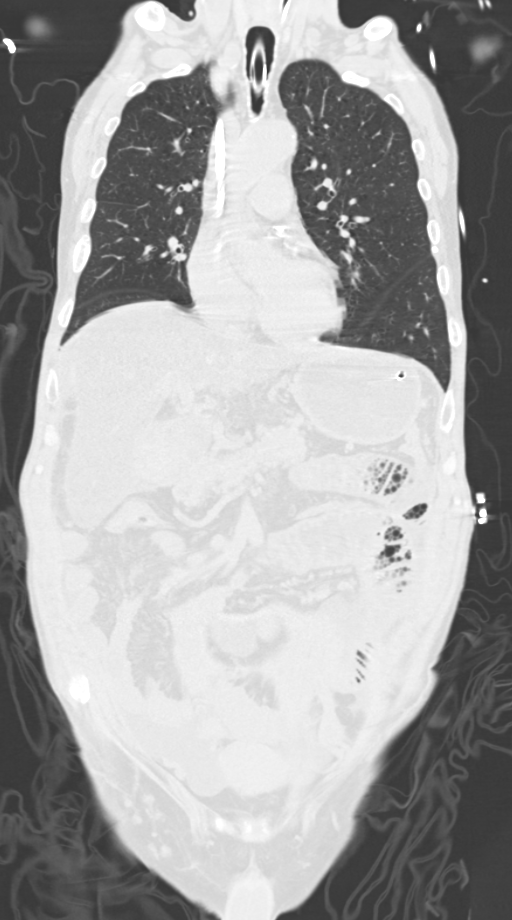
[im 87/145  lung]
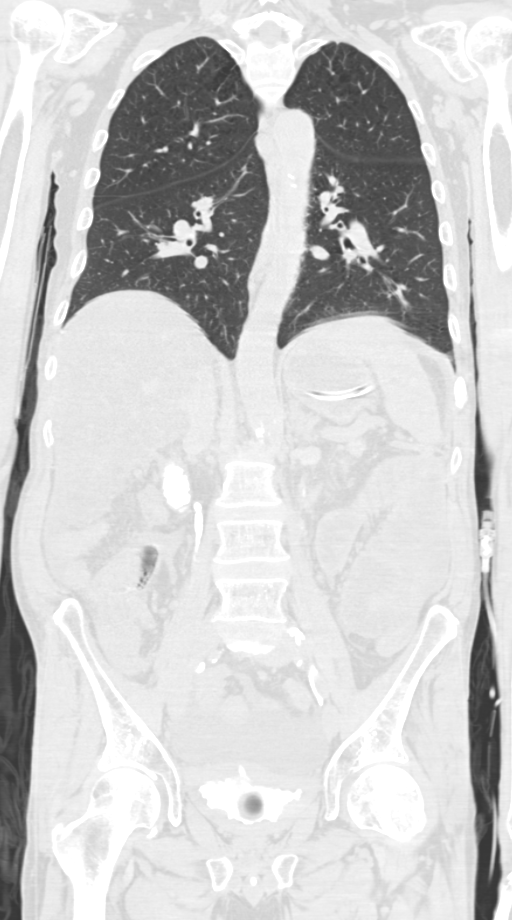

[11 of 36 positions shown; findings below may reference images not displayed]

FINDINGS: CT CHEST FINDINGS

Cardiovascular: Atherosclerosis of the aorta, great vessels and
coronary arteries. Right IJ central venous catheter extends into the
superior aspect of the right atrium. The heart size is normal. There
is no pericardial effusion.

Mediastinum/Nodes: There are no enlarged mediastinal, hilar or
axillary lymph nodes. Endotracheal and nasogastric tubes are in
place. There is possible wall thickening of the distal esophagus.
The thyroid gland and trachea demonstrate no significant findings.

Lungs/Pleura: There is no pleural effusion. Mild emphysema. There is
a small calcified granuloma in the lingula on image 70 which may
account for the radiographic finding. No suspicious pulmonary
nodules. There is no confluent airspace opacity or pneumothorax.

Musculoskeletal/Chest wall: Bilateral gynecomastia noted. Old rib
fractures are present bilaterally. Fractures of the left eighth and
ninth ribs may be incompletely healed. There is a fracture of the
T12 vertebral body with associated mild osseous retropulsion. This
is not clearly seen on chest radiographs from [facility] and could be
subacute in age.

CT ABDOMEN AND PELVIS FINDINGS

Hepatobiliary: Diffuse hepatic steatosis. No focal hepatic
abnormalities are seen on noncontrast imaging. There is high-density
material layering within the gallbladder lumen. This is likely due
to a combination of gallstones and vicarious excretion of contrast.
No gallbladder wall thickening or significant biliary dilatation
seen.

Pancreas: Unremarkable. No pancreatic ductal dilatation or
surrounding inflammatory changes.

Spleen: Normal in size without focal abnormality.

Adrenals/Urinary Tract: Both adrenal glands appear normal. There is
contrast material within the renal collecting systems bilaterally,
attributed to previous interventional procedure. Low-density renal
lesions are present bilaterally, likely cysts. One in the lower pole
of the left kidney measures 3.1 cm and 21 HU. No evidence of
hydronephrosis. Bladder is partially decompressed by a Foley
catheter. There is bladder wall thickening.

Stomach/Bowel: The stomach is decompressed by a nasogastric tube.
The proximal small bowel is fluid filled with scattered air-fluid
levels and mild dilatation. There is an umbilical hernia containing
small bowel, measuring up to 7.0 x 8.9 cm transverse. The distal
small bowel is decompressed. No colonic abnormalities are seen.
There is a small periampullary duodenal diverticulum.

Vascular/Lymphatic: There are no enlarged abdominal or pelvic lymph
nodes. Diffuse atherosclerosis of the aorta and its branches. TIPS
extends from the portal vein to the junction of the IVC with the
right hepatic vein. Patency not addressed by this examination. No
evidence of surrounding hematoma. Probable upper abdominal
collateral vessels/varices.

Reproductive: Mild enlargement of the prostate gland which
demonstrates dystrophic calcifications. The seminal vesicles appear
unremarkable.

Other: As above, umbilical hernia containing small bowel. There is a
moderate amount of ascites, with most of the fluid in the pelvis.

Musculoskeletal: No acute or significant osseous findings.
IMPRESSION: 1. No suspicious pulmonary nodule. There is a small calcified
granuloma in the lingula.
2. Umbilical hernia containing fluid-filled small bowel. There is
proximal small bowel dilatation with air-fluid levels, supporting
the presence of an incarcerated hernia.
3. Moderate ascites, attributed to underlying liver disease.
4. T12 superior endplate compression fracture with osseous
retropulsion, potentially subacute.
5. Indeterminate 3.1 cm left renal lesion, measuring higher than
water density. Statistically, this is a complex cyst.
6. Additional incidental findings including emphysema, bilateral rib
fractures, hepatic steatosis, diffuse atherosclerosis and bladder
wall thickening.

## 2018-01-31 IMAGING — US US RENAL
1 series · 14 of 25 positions shown · non-contrast
Comparison: Ultrasound 03/22/2016.

CLINICAL DATA: Urinary retention.  Cirrhosis of liver.

EXAM:
RENAL / URINARY TRACT ULTRASOUND COMPLETE

[Series 1: us renal · 0.33mm/px · 14 of 29 slices shown]
[im 1/29]
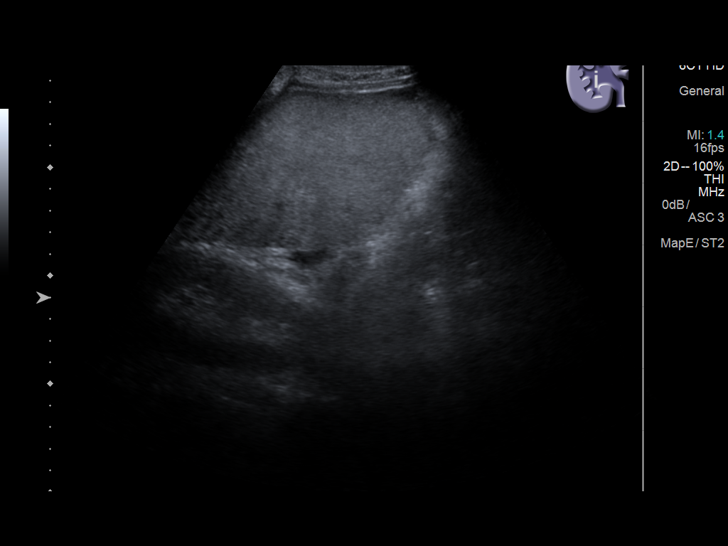
[im 3/29]
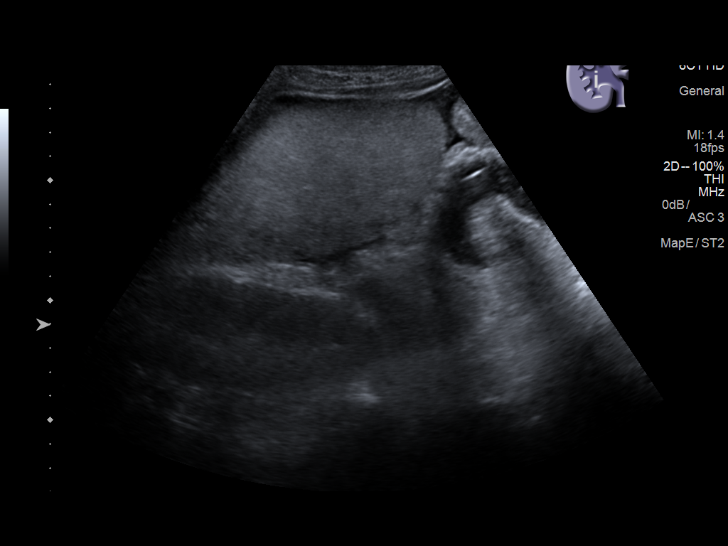
[im 5/29]
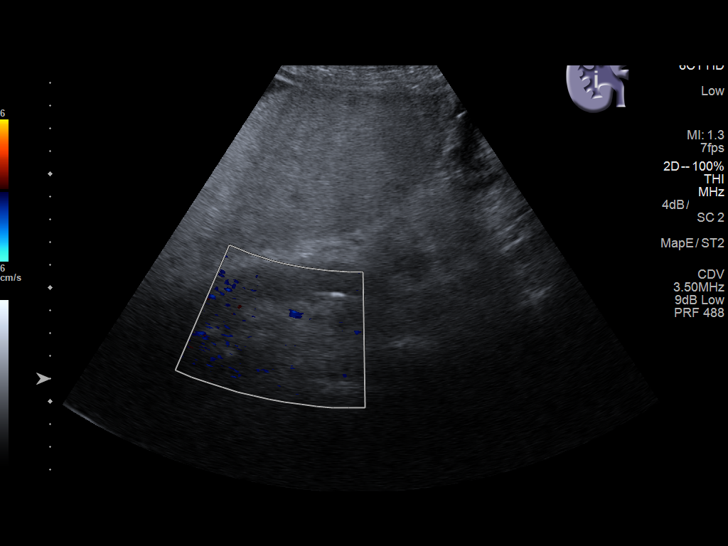
[im 8/29]
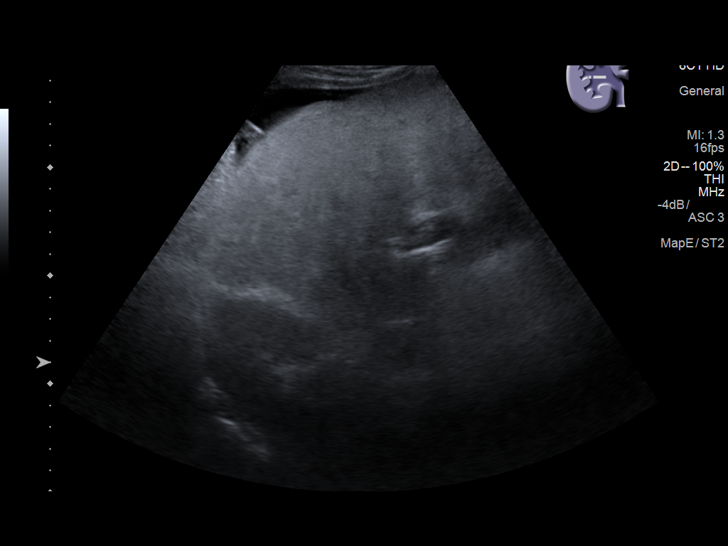
[im 10/29]
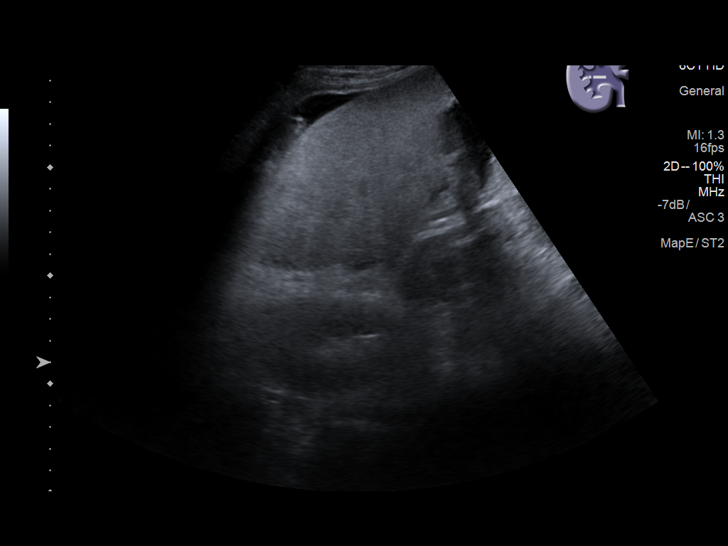
[im 11/29]
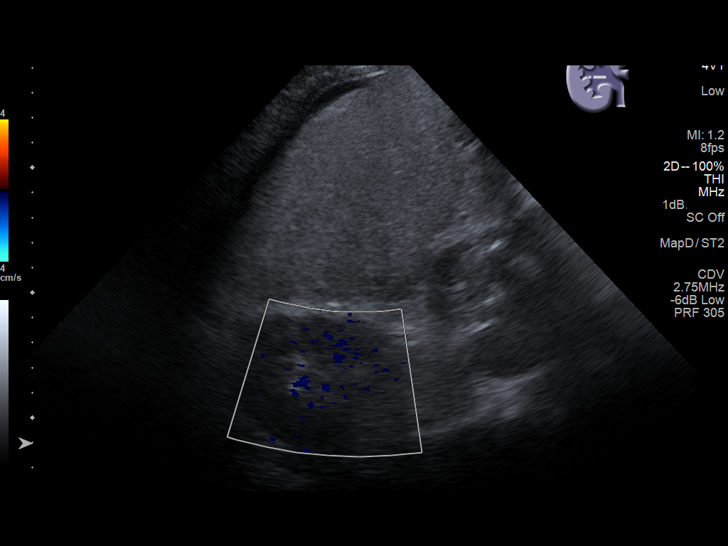
[im 13/29]
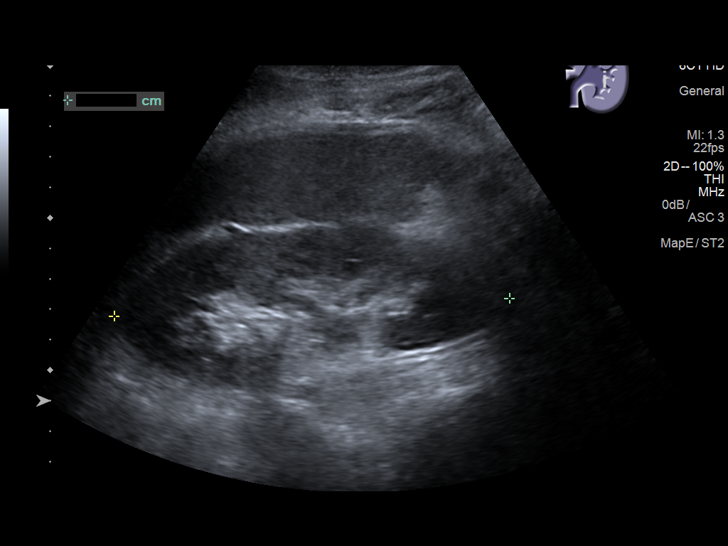
[im 16/29]
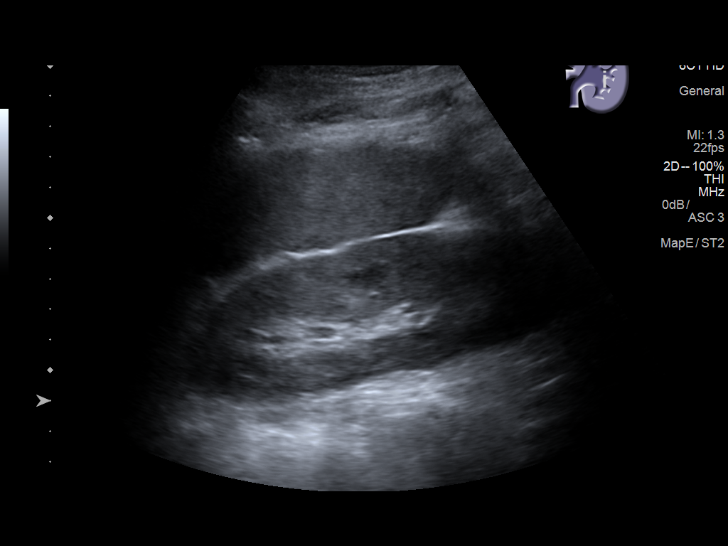
[im 18/29]
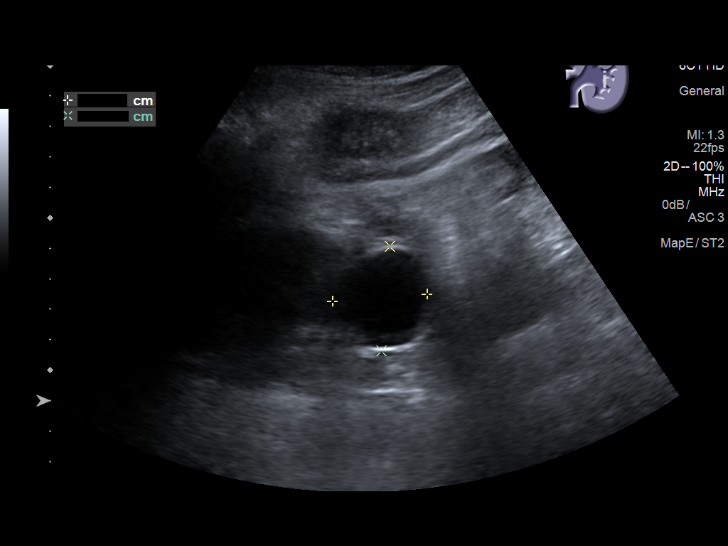
[im 19/29]
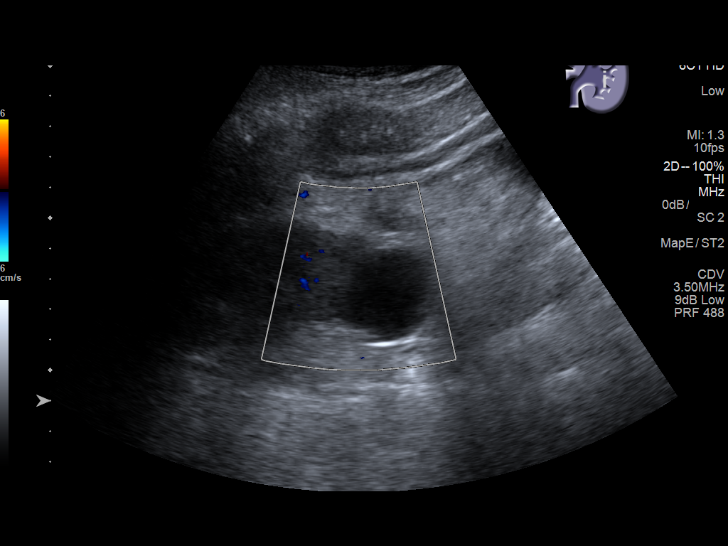
[im 22/29]
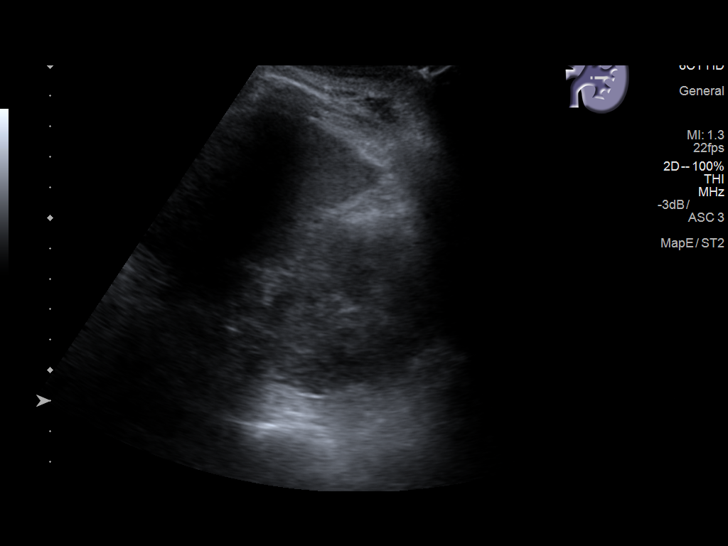
[im 24/29]
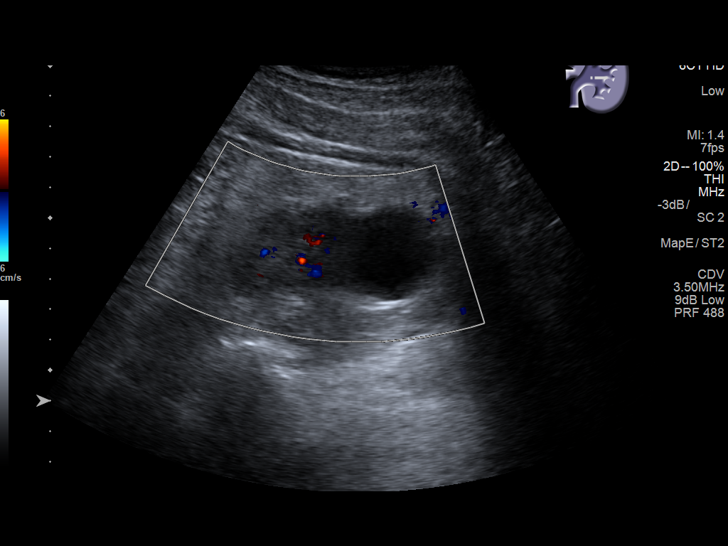
[im 26/29]
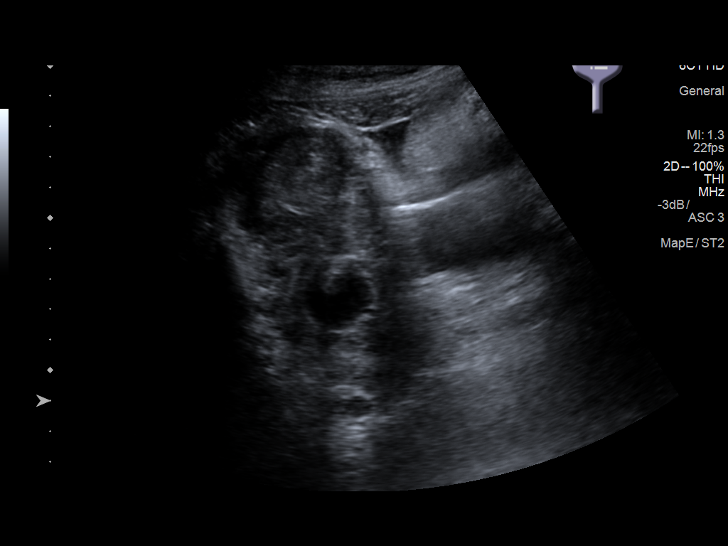
[im 29/29]
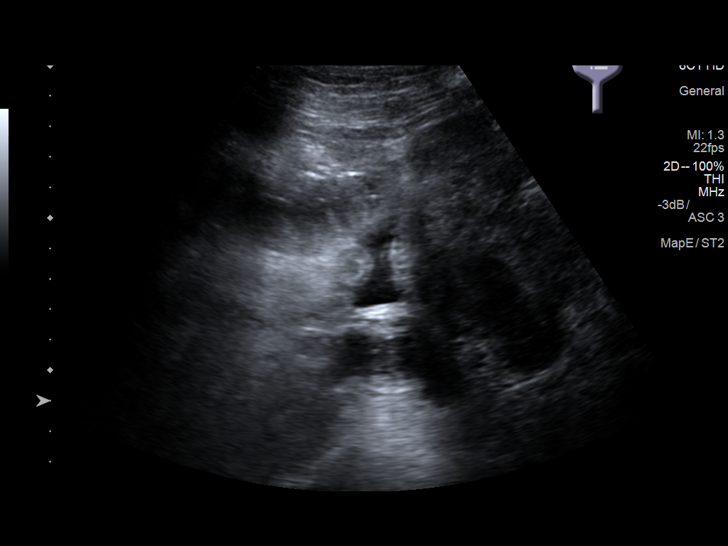

[14 of 25 positions shown; findings below may reference images not displayed]

FINDINGS: Right Kidney:

Length: 11.5 cm. Echogenicity within normal limits. No mass or
hydronephrosis visualized.

Left Kidney:

Length: 13.0 cm. 3 cm left lower pole renal cyst.. Echogenicity
within normal limits. No mass or hydronephrosis visualized.

Bladder:

The urinary bladder empty.  Foley catheter in the bladder

Mild ascites.
IMPRESSION: No renal obstruction.  Urinary bladder empty with Foley catheter

Mild ascites, significantly improved from ultrasound of 03/22/2016

## 2021-05-13 NOTE — Telephone Encounter (Signed)
Closing out this encounter.

## 2023-11-13 NOTE — Progress Notes (Signed)
 This encounter was created in error - please disregard.
